# Patient Record
Sex: Female | Born: 1960
Health system: Southern US, Community
[De-identification: ages and names within clinical notes are randomized; demographics above are authoritative.]

## PROBLEM LIST (undated history)

## (undated) DIAGNOSIS — Z860100 Personal history of colon polyps, unspecified: Secondary | ICD-10-CM

## (undated) DIAGNOSIS — I10 Essential (primary) hypertension: Secondary | ICD-10-CM

## (undated) DIAGNOSIS — M1990 Unspecified osteoarthritis, unspecified site: Secondary | ICD-10-CM

## (undated) DIAGNOSIS — J329 Chronic sinusitis, unspecified: Secondary | ICD-10-CM

## (undated) DIAGNOSIS — Z9889 Other specified postprocedural states: Secondary | ICD-10-CM

## (undated) DIAGNOSIS — K219 Gastro-esophageal reflux disease without esophagitis: Secondary | ICD-10-CM

## (undated) DIAGNOSIS — K36 Other appendicitis: Secondary | ICD-10-CM

## (undated) DIAGNOSIS — R112 Nausea with vomiting, unspecified: Secondary | ICD-10-CM

## (undated) DIAGNOSIS — R079 Chest pain, unspecified: Secondary | ICD-10-CM

## (undated) DIAGNOSIS — T464X5A Adverse effect of angiotensin-converting-enzyme inhibitors, initial encounter: Secondary | ICD-10-CM

## (undated) DIAGNOSIS — R609 Edema, unspecified: Secondary | ICD-10-CM

## (undated) DIAGNOSIS — T4145XA Adverse effect of unspecified anesthetic, initial encounter: Secondary | ICD-10-CM

## (undated) DIAGNOSIS — E559 Vitamin D deficiency, unspecified: Secondary | ICD-10-CM

## (undated) DIAGNOSIS — I878 Other specified disorders of veins: Secondary | ICD-10-CM

## (undated) DIAGNOSIS — E739 Lactose intolerance, unspecified: Secondary | ICD-10-CM

## (undated) DIAGNOSIS — M549 Dorsalgia, unspecified: Secondary | ICD-10-CM

## (undated) DIAGNOSIS — T783XXA Angioneurotic edema, initial encounter: Secondary | ICD-10-CM

## (undated) DIAGNOSIS — J189 Pneumonia, unspecified organism: Secondary | ICD-10-CM

## (undated) DIAGNOSIS — M2550 Pain in unspecified joint: Secondary | ICD-10-CM

## (undated) DIAGNOSIS — K5900 Constipation, unspecified: Secondary | ICD-10-CM

## (undated) DIAGNOSIS — R0602 Shortness of breath: Secondary | ICD-10-CM

## (undated) DIAGNOSIS — E785 Hyperlipidemia, unspecified: Secondary | ICD-10-CM

## (undated) DIAGNOSIS — R7303 Prediabetes: Secondary | ICD-10-CM

## (undated) DIAGNOSIS — T8859XA Other complications of anesthesia, initial encounter: Secondary | ICD-10-CM

## (undated) HISTORY — PX: APPENDECTOMY: SHX54

## (undated) HISTORY — DX: Gastro-esophageal reflux disease without esophagitis: K21.9

## (undated) HISTORY — DX: Angioneurotic edema, initial encounter: T78.3XXA

## (undated) HISTORY — DX: Chest pain, unspecified: R07.9

## (undated) HISTORY — DX: Prediabetes: R73.03

## (undated) HISTORY — DX: Morbid (severe) obesity due to excess calories: E66.01

## (undated) HISTORY — DX: Chronic sinusitis, unspecified: J32.9

## (undated) HISTORY — DX: Shortness of breath: R06.02

## (undated) HISTORY — DX: Adverse effect of angiotensin-converting-enzyme inhibitors, initial encounter: T46.4X5A

## (undated) HISTORY — PX: ABDOMINAL HYSTERECTOMY: SHX81

## (undated) HISTORY — DX: Constipation, unspecified: K59.00

## (undated) HISTORY — DX: Dorsalgia, unspecified: M54.9

## (undated) HISTORY — DX: Edema, unspecified: R60.9

## (undated) HISTORY — DX: Pain in unspecified joint: M25.50

## (undated) HISTORY — DX: Hyperlipidemia, unspecified: E78.5

## (undated) HISTORY — DX: Essential (primary) hypertension: I10

## (undated) HISTORY — DX: Lactose intolerance, unspecified: E73.9

## (undated) HISTORY — DX: Vitamin D deficiency, unspecified: E55.9

## (undated) HISTORY — PX: KNEE SURGERY: SHX244

## (undated) HISTORY — DX: Other appendicitis: K36

## (undated) HISTORY — DX: Pneumonia, unspecified organism: J18.9

---

## 1998-05-23 ENCOUNTER — Encounter: Admission: RE | Admit: 1998-05-23 | Discharge: 1998-06-28 | Payer: Self-pay | Admitting: Family Medicine

## 1998-09-11 ENCOUNTER — Other Ambulatory Visit: Admission: RE | Admit: 1998-09-11 | Discharge: 1998-09-11 | Payer: Self-pay | Admitting: Gynecology

## 1998-09-24 ENCOUNTER — Other Ambulatory Visit: Admission: RE | Admit: 1998-09-24 | Discharge: 1998-09-24 | Payer: Self-pay | Admitting: Gynecology

## 1999-10-23 ENCOUNTER — Encounter: Admission: RE | Admit: 1999-10-23 | Discharge: 2000-01-21 | Payer: Self-pay | Admitting: Family Medicine

## 2000-10-23 ENCOUNTER — Encounter: Payer: Self-pay | Admitting: Internal Medicine

## 2000-10-23 ENCOUNTER — Encounter: Admission: RE | Admit: 2000-10-23 | Discharge: 2000-10-23 | Payer: Self-pay | Admitting: Internal Medicine

## 2000-10-28 ENCOUNTER — Encounter: Admission: RE | Admit: 2000-10-28 | Discharge: 2000-10-28 | Payer: Self-pay | Admitting: Internal Medicine

## 2000-10-28 ENCOUNTER — Encounter: Payer: Self-pay | Admitting: Internal Medicine

## 2001-04-09 ENCOUNTER — Encounter: Admission: RE | Admit: 2001-04-09 | Discharge: 2001-04-09 | Payer: Self-pay | Admitting: Internal Medicine

## 2001-04-09 ENCOUNTER — Encounter: Payer: Self-pay | Admitting: Internal Medicine

## 2002-06-23 ENCOUNTER — Encounter: Payer: Self-pay | Admitting: Internal Medicine

## 2002-06-23 ENCOUNTER — Encounter: Admission: RE | Admit: 2002-06-23 | Discharge: 2002-06-23 | Payer: Self-pay | Admitting: Internal Medicine

## 2004-01-17 ENCOUNTER — Encounter: Admission: RE | Admit: 2004-01-17 | Discharge: 2004-01-17 | Payer: Self-pay | Admitting: Internal Medicine

## 2005-06-19 ENCOUNTER — Encounter: Admission: RE | Admit: 2005-06-19 | Discharge: 2005-06-19 | Payer: Self-pay | Admitting: Internal Medicine

## 2005-10-08 ENCOUNTER — Encounter: Admission: RE | Admit: 2005-10-08 | Discharge: 2005-10-08 | Payer: Self-pay | Admitting: Internal Medicine

## 2005-12-23 ENCOUNTER — Ambulatory Visit: Payer: Self-pay | Admitting: Family Medicine

## 2006-01-15 ENCOUNTER — Encounter: Admission: RE | Admit: 2006-01-15 | Discharge: 2006-04-15 | Payer: Self-pay | Admitting: Family Medicine

## 2006-01-20 ENCOUNTER — Ambulatory Visit: Payer: Self-pay | Admitting: Family Medicine

## 2006-01-23 ENCOUNTER — Ambulatory Visit: Payer: Self-pay | Admitting: Family Medicine

## 2006-02-26 ENCOUNTER — Ambulatory Visit: Payer: Self-pay | Admitting: Family Medicine

## 2006-03-16 ENCOUNTER — Ambulatory Visit: Payer: Self-pay | Admitting: Family Medicine

## 2006-03-16 LAB — CONVERTED CEMR LAB
ALT: 20 units/L (ref 0–40)
Amylase: 58 units/L (ref 27–131)
Bilirubin, Direct: 0.1 mg/dL (ref 0.0–0.3)
Hemoglobin: 13.6 g/dL (ref 12.0–15.0)
Lipase: 24 units/L (ref 11.0–59.0)
MCHC: 33 g/dL (ref 30.0–36.0)
MCV: 88.6 fL (ref 78.0–100.0)
RBC: 4.65 M/uL (ref 3.87–5.11)
RDW: 13.4 % (ref 11.5–14.6)
Total Protein: 7.1 g/dL (ref 6.0–8.3)

## 2006-03-20 ENCOUNTER — Ambulatory Visit: Payer: Self-pay | Admitting: Family Medicine

## 2006-03-25 ENCOUNTER — Ambulatory Visit: Payer: Self-pay | Admitting: Family Medicine

## 2006-03-30 ENCOUNTER — Ambulatory Visit: Payer: Self-pay | Admitting: Internal Medicine

## 2006-04-14 ENCOUNTER — Ambulatory Visit: Payer: Self-pay | Admitting: Internal Medicine

## 2006-04-14 HISTORY — PX: UPPER GASTROINTESTINAL ENDOSCOPY: SHX188

## 2006-04-17 ENCOUNTER — Ambulatory Visit (HOSPITAL_COMMUNITY): Admission: RE | Admit: 2006-04-17 | Discharge: 2006-04-17 | Payer: Self-pay | Admitting: Internal Medicine

## 2006-05-07 ENCOUNTER — Ambulatory Visit: Payer: Self-pay | Admitting: Internal Medicine

## 2006-05-11 ENCOUNTER — Ambulatory Visit: Payer: Self-pay | Admitting: Family Medicine

## 2006-05-15 ENCOUNTER — Ambulatory Visit: Payer: Self-pay | Admitting: Family Medicine

## 2006-05-16 ENCOUNTER — Encounter (INDEPENDENT_AMBULATORY_CARE_PROVIDER_SITE_OTHER): Payer: Self-pay | Admitting: Family Medicine

## 2006-05-20 ENCOUNTER — Ambulatory Visit: Payer: Self-pay | Admitting: Family Medicine

## 2006-05-26 DIAGNOSIS — I10 Essential (primary) hypertension: Secondary | ICD-10-CM | POA: Insufficient documentation

## 2006-05-26 DIAGNOSIS — K219 Gastro-esophageal reflux disease without esophagitis: Secondary | ICD-10-CM | POA: Insufficient documentation

## 2006-08-12 ENCOUNTER — Ambulatory Visit: Payer: Self-pay | Admitting: Family Medicine

## 2006-08-19 ENCOUNTER — Ambulatory Visit: Payer: Self-pay | Admitting: Family Medicine

## 2006-08-19 LAB — CONVERTED CEMR LAB
CO2: 29 meq/L (ref 19–32)
Creatinine, Ser: 0.8 mg/dL (ref 0.4–1.2)
GFR calc Af Amer: 100 mL/min
Potassium: 3.9 meq/L (ref 3.5–5.1)
Sodium: 141 meq/L (ref 135–145)

## 2006-08-31 ENCOUNTER — Ambulatory Visit: Payer: Self-pay | Admitting: Family Medicine

## 2006-08-31 LAB — CONVERTED CEMR LAB: Glucose, Fasting: 126 mg/dL — ABNORMAL HIGH (ref 70–99)

## 2006-09-04 ENCOUNTER — Ambulatory Visit: Payer: Self-pay | Admitting: Family Medicine

## 2006-09-09 ENCOUNTER — Ambulatory Visit: Payer: Self-pay | Admitting: Family Medicine

## 2006-09-09 LAB — CONVERTED CEMR LAB
Cholesterol: 206 mg/dL (ref 0–200)
Creatinine,U: 256.4 mg/dL
Direct LDL: 137.5 mg/dL
HDL: 54.8 mg/dL (ref >39.0)
Total CHOL/HDL Ratio: 3.8
VLDL: 9 mg/dL (ref 0–40)

## 2006-09-25 ENCOUNTER — Encounter (INDEPENDENT_AMBULATORY_CARE_PROVIDER_SITE_OTHER): Payer: Self-pay | Admitting: Family Medicine

## 2006-10-07 ENCOUNTER — Telehealth (INDEPENDENT_AMBULATORY_CARE_PROVIDER_SITE_OTHER): Payer: Self-pay | Admitting: Family Medicine

## 2006-10-08 ENCOUNTER — Encounter (INDEPENDENT_AMBULATORY_CARE_PROVIDER_SITE_OTHER): Payer: Self-pay | Admitting: Family Medicine

## 2006-10-15 ENCOUNTER — Emergency Department (HOSPITAL_COMMUNITY): Admission: EM | Admit: 2006-10-15 | Discharge: 2006-10-15 | Payer: Self-pay | Admitting: Emergency Medicine

## 2006-11-09 ENCOUNTER — Ambulatory Visit: Payer: Self-pay | Admitting: Family Medicine

## 2006-11-09 ENCOUNTER — Telehealth (INDEPENDENT_AMBULATORY_CARE_PROVIDER_SITE_OTHER): Payer: Self-pay | Admitting: Family Medicine

## 2006-11-09 DIAGNOSIS — E785 Hyperlipidemia, unspecified: Secondary | ICD-10-CM | POA: Insufficient documentation

## 2006-11-09 DIAGNOSIS — E1151 Type 2 diabetes mellitus with diabetic peripheral angiopathy without gangrene: Secondary | ICD-10-CM | POA: Insufficient documentation

## 2006-12-16 ENCOUNTER — Ambulatory Visit: Payer: Self-pay | Admitting: Family Medicine

## 2006-12-16 DIAGNOSIS — R609 Edema, unspecified: Secondary | ICD-10-CM | POA: Insufficient documentation

## 2006-12-18 LAB — CONVERTED CEMR LAB
CO2: 28 meq/L (ref 19–32)
Cholesterol: 200 mg/dL (ref 0–200)
GFR calc Af Amer: 77 mL/min
Glucose, Bld: 110 mg/dL — ABNORMAL HIGH (ref 70–99)
LDL Cholesterol: 133 mg/dL — ABNORMAL HIGH (ref 0–99)
Potassium: 4 meq/L (ref 3.5–5.1)
Sodium: 140 meq/L (ref 135–145)
Total CHOL/HDL Ratio: 3.5
Triglycerides: 44 mg/dL (ref 0–149)

## 2007-02-08 ENCOUNTER — Telehealth (INDEPENDENT_AMBULATORY_CARE_PROVIDER_SITE_OTHER): Payer: Self-pay | Admitting: *Deleted

## 2007-02-10 ENCOUNTER — Ambulatory Visit: Payer: Self-pay | Admitting: Family Medicine

## 2007-02-10 ENCOUNTER — Encounter (INDEPENDENT_AMBULATORY_CARE_PROVIDER_SITE_OTHER): Payer: Self-pay | Admitting: *Deleted

## 2007-02-10 ENCOUNTER — Telehealth (INDEPENDENT_AMBULATORY_CARE_PROVIDER_SITE_OTHER): Payer: Self-pay | Admitting: *Deleted

## 2007-02-12 ENCOUNTER — Telehealth (INDEPENDENT_AMBULATORY_CARE_PROVIDER_SITE_OTHER): Payer: Self-pay | Admitting: *Deleted

## 2007-02-12 ENCOUNTER — Encounter (INDEPENDENT_AMBULATORY_CARE_PROVIDER_SITE_OTHER): Payer: Self-pay | Admitting: *Deleted

## 2007-02-19 ENCOUNTER — Telehealth (INDEPENDENT_AMBULATORY_CARE_PROVIDER_SITE_OTHER): Payer: Self-pay | Admitting: *Deleted

## 2007-02-19 ENCOUNTER — Telehealth (INDEPENDENT_AMBULATORY_CARE_PROVIDER_SITE_OTHER): Payer: Self-pay | Admitting: Family Medicine

## 2007-02-24 ENCOUNTER — Ambulatory Visit: Payer: Self-pay | Admitting: Family Medicine

## 2007-02-25 ENCOUNTER — Telehealth (INDEPENDENT_AMBULATORY_CARE_PROVIDER_SITE_OTHER): Payer: Self-pay | Admitting: *Deleted

## 2007-02-25 LAB — CONVERTED CEMR LAB
ALT: 25 units/L (ref 0–35)
LDL Cholesterol: 81 mg/dL (ref 0–99)
Total CHOL/HDL Ratio: 2.6
Triglycerides: 32 mg/dL (ref 0–149)
VLDL: 6 mg/dL (ref 0–40)

## 2007-03-02 ENCOUNTER — Telehealth (INDEPENDENT_AMBULATORY_CARE_PROVIDER_SITE_OTHER): Payer: Self-pay | Admitting: *Deleted

## 2007-03-10 ENCOUNTER — Encounter (INDEPENDENT_AMBULATORY_CARE_PROVIDER_SITE_OTHER): Payer: Self-pay | Admitting: Family Medicine

## 2007-03-16 ENCOUNTER — Encounter (INDEPENDENT_AMBULATORY_CARE_PROVIDER_SITE_OTHER): Payer: Self-pay | Admitting: Family Medicine

## 2007-03-19 ENCOUNTER — Ambulatory Visit: Payer: Self-pay | Admitting: Family Medicine

## 2007-03-25 ENCOUNTER — Telehealth (INDEPENDENT_AMBULATORY_CARE_PROVIDER_SITE_OTHER): Payer: Self-pay | Admitting: *Deleted

## 2007-03-30 ENCOUNTER — Telehealth (INDEPENDENT_AMBULATORY_CARE_PROVIDER_SITE_OTHER): Payer: Self-pay | Admitting: *Deleted

## 2007-04-02 ENCOUNTER — Encounter (INDEPENDENT_AMBULATORY_CARE_PROVIDER_SITE_OTHER): Payer: Self-pay | Admitting: *Deleted

## 2007-04-05 ENCOUNTER — Telehealth (INDEPENDENT_AMBULATORY_CARE_PROVIDER_SITE_OTHER): Payer: Self-pay | Admitting: *Deleted

## 2007-04-07 ENCOUNTER — Telehealth (INDEPENDENT_AMBULATORY_CARE_PROVIDER_SITE_OTHER): Payer: Self-pay | Admitting: *Deleted

## 2007-04-20 ENCOUNTER — Encounter (INDEPENDENT_AMBULATORY_CARE_PROVIDER_SITE_OTHER): Payer: Self-pay | Admitting: Family Medicine

## 2007-04-23 ENCOUNTER — Telehealth (INDEPENDENT_AMBULATORY_CARE_PROVIDER_SITE_OTHER): Payer: Self-pay | Admitting: *Deleted

## 2007-04-26 ENCOUNTER — Telehealth (INDEPENDENT_AMBULATORY_CARE_PROVIDER_SITE_OTHER): Payer: Self-pay | Admitting: *Deleted

## 2007-05-19 ENCOUNTER — Telehealth (INDEPENDENT_AMBULATORY_CARE_PROVIDER_SITE_OTHER): Payer: Self-pay | Admitting: *Deleted

## 2007-05-19 ENCOUNTER — Ambulatory Visit: Payer: Self-pay | Admitting: Family Medicine

## 2007-05-19 ENCOUNTER — Encounter: Admission: RE | Admit: 2007-05-19 | Discharge: 2007-05-19 | Payer: Self-pay | Admitting: Family Medicine

## 2007-05-19 LAB — CONVERTED CEMR LAB
Basophils Relative: 0.5 % (ref 0.0–1.0)
HDL: 46.1 mg/dL (ref >39.0)
Hemoglobin: 12.2 g/dL (ref 12.0–15.0)
LDL Cholesterol: 90 mg/dL (ref 0–99)
Monocytes Absolute: 0.4 10*3/uL (ref 0.2–0.7)
Monocytes Relative: 9.6 % (ref 3.0–11.0)
RDW: 13.3 % (ref 11.5–14.6)
TSH: 1.65 microintl units/mL (ref 0.35–5.50)
VLDL: 9 mg/dL (ref 0–40)
WBC: 4.3 10*3/uL — ABNORMAL LOW (ref 4.5–10.5)

## 2007-06-08 ENCOUNTER — Encounter: Admission: RE | Admit: 2007-06-08 | Discharge: 2007-06-08 | Payer: Self-pay | Admitting: Family Medicine

## 2007-06-08 ENCOUNTER — Other Ambulatory Visit: Admission: RE | Admit: 2007-06-08 | Discharge: 2007-06-08 | Payer: Self-pay | Admitting: Gynecology

## 2007-07-20 ENCOUNTER — Ambulatory Visit: Payer: Self-pay | Admitting: Family Medicine

## 2007-07-20 LAB — CONVERTED CEMR LAB
BUN: 13 mg/dL (ref 6–23)
Chloride: 104 meq/L (ref 96–112)
Creatinine, Ser: 1 mg/dL (ref 0.4–1.2)
Hgb A1c MFr Bld: 7 % — ABNORMAL HIGH (ref 4.6–6.0)
Sodium: 138 meq/L (ref 135–145)

## 2007-07-23 ENCOUNTER — Telehealth (INDEPENDENT_AMBULATORY_CARE_PROVIDER_SITE_OTHER): Payer: Self-pay | Admitting: *Deleted

## 2007-08-20 ENCOUNTER — Ambulatory Visit: Payer: Self-pay | Admitting: Family Medicine

## 2007-08-30 ENCOUNTER — Encounter (INDEPENDENT_AMBULATORY_CARE_PROVIDER_SITE_OTHER): Payer: Self-pay | Admitting: Family Medicine

## 2007-09-07 ENCOUNTER — Encounter: Payer: Self-pay | Admitting: Family Medicine

## 2007-09-20 ENCOUNTER — Telehealth (INDEPENDENT_AMBULATORY_CARE_PROVIDER_SITE_OTHER): Payer: Self-pay | Admitting: *Deleted

## 2007-11-09 ENCOUNTER — Ambulatory Visit: Payer: Self-pay | Admitting: Family Medicine

## 2007-11-11 ENCOUNTER — Encounter: Payer: Self-pay | Admitting: Family Medicine

## 2007-11-16 ENCOUNTER — Telehealth (INDEPENDENT_AMBULATORY_CARE_PROVIDER_SITE_OTHER): Payer: Self-pay | Admitting: *Deleted

## 2007-11-16 ENCOUNTER — Ambulatory Visit: Payer: Self-pay | Admitting: Family Medicine

## 2007-11-17 ENCOUNTER — Telehealth (INDEPENDENT_AMBULATORY_CARE_PROVIDER_SITE_OTHER): Payer: Self-pay | Admitting: *Deleted

## 2007-11-20 LAB — CONVERTED CEMR LAB
AST: 20 units/L (ref 0–37)
Alkaline Phosphatase: 79 units/L (ref 39–117)
Basophils Absolute: 0 10*3/uL (ref 0.0–0.1)
Chloride: 103 meq/L (ref 96–112)
Cholesterol: 195 mg/dL (ref 0–200)
Creatinine,U: 171.6 mg/dL
Eosinophils Absolute: 0.1 10*3/uL (ref 0.0–0.7)
GFR calc non Af Amer: 72 mL/min
HDL: 60 mg/dL (ref >39.0)
Hgb A1c MFr Bld: 7.1 % — ABNORMAL HIGH (ref 4.6–6.0)
MCHC: 33.4 g/dL (ref 30.0–36.0)
MCV: 89.3 fL (ref 78.0–100.0)
Microalb Creat Ratio: 2.3 mg/g (ref 0.0–30.0)
Neutrophils Relative %: 53.1 % (ref 43.0–77.0)
Platelets: 272 10*3/uL (ref 150–400)
Potassium: 3.4 meq/L — ABNORMAL LOW (ref 3.5–5.1)
RDW: 13.1 % (ref 11.5–14.6)
Total Bilirubin: 0.7 mg/dL (ref 0.3–1.2)
Uric Acid, Serum: 6.3 mg/dL (ref 2.4–7.0)
VLDL: 9 mg/dL (ref 0–40)

## 2007-11-22 ENCOUNTER — Encounter (INDEPENDENT_AMBULATORY_CARE_PROVIDER_SITE_OTHER): Payer: Self-pay | Admitting: *Deleted

## 2007-11-23 ENCOUNTER — Ambulatory Visit: Payer: Self-pay | Admitting: Family Medicine

## 2007-11-26 ENCOUNTER — Ambulatory Visit: Payer: Self-pay | Admitting: Family Medicine

## 2007-12-07 ENCOUNTER — Ambulatory Visit: Payer: Self-pay | Admitting: Family Medicine

## 2007-12-14 ENCOUNTER — Ambulatory Visit: Payer: Self-pay | Admitting: Family Medicine

## 2007-12-21 ENCOUNTER — Ambulatory Visit: Payer: Self-pay | Admitting: Family Medicine

## 2007-12-24 ENCOUNTER — Ambulatory Visit: Payer: Self-pay | Admitting: Family Medicine

## 2007-12-25 ENCOUNTER — Encounter: Payer: Self-pay | Admitting: Family Medicine

## 2007-12-27 ENCOUNTER — Encounter (INDEPENDENT_AMBULATORY_CARE_PROVIDER_SITE_OTHER): Payer: Self-pay | Admitting: *Deleted

## 2007-12-27 ENCOUNTER — Ambulatory Visit: Payer: Self-pay | Admitting: Family Medicine

## 2007-12-27 LAB — CONVERTED CEMR LAB
Glucose, Bld: 118 mg/dL
Hemoglobin: 14.3 g/dL

## 2008-01-13 ENCOUNTER — Encounter (INDEPENDENT_AMBULATORY_CARE_PROVIDER_SITE_OTHER): Payer: Self-pay | Admitting: *Deleted

## 2008-01-13 ENCOUNTER — Ambulatory Visit: Payer: Self-pay | Admitting: Family Medicine

## 2008-02-07 ENCOUNTER — Telehealth (INDEPENDENT_AMBULATORY_CARE_PROVIDER_SITE_OTHER): Payer: Self-pay | Admitting: *Deleted

## 2008-02-23 ENCOUNTER — Ambulatory Visit: Payer: Self-pay | Admitting: Family Medicine

## 2008-02-29 LAB — CONVERTED CEMR LAB
ALT: 23 units/L (ref 0–35)
AST: 18 units/L (ref 0–37)
Albumin: 3.5 g/dL (ref 3.5–5.2)
Alkaline Phosphatase: 71 units/L (ref 39–117)
BUN: 13 mg/dL (ref 6–23)
Bilirubin, Direct: 0.1 mg/dL (ref 0.0–0.3)
CO2: 29 meq/L (ref 19–32)
Calcium: 8.5 mg/dL (ref 8.4–10.5)
Chloride: 105 meq/L (ref 96–112)
Cholesterol: 153 mg/dL (ref 0–200)
Creatinine, Ser: 0.9 mg/dL (ref 0.4–1.2)
GFR calc Af Amer: 86 mL/min
GFR calc non Af Amer: 71 mL/min
Glucose, Bld: 83 mg/dL (ref 70–99)
HDL: 53.3 mg/dL (ref >39.0)
Hgb A1c MFr Bld: 6.3 % — ABNORMAL HIGH (ref 4.6–6.0)
LDL Cholesterol: 88 mg/dL (ref 0–99)
Potassium: 3.7 meq/L (ref 3.5–5.1)
Sodium: 140 meq/L (ref 135–145)
Total Bilirubin: 0.6 mg/dL (ref 0.3–1.2)
Total CHOL/HDL Ratio: 2.9
Total Protein: 6.7 g/dL (ref 6.0–8.3)
Triglycerides: 59 mg/dL (ref 0–149)
VLDL: 12 mg/dL (ref 0–40)

## 2008-03-01 ENCOUNTER — Encounter (INDEPENDENT_AMBULATORY_CARE_PROVIDER_SITE_OTHER): Payer: Self-pay | Admitting: *Deleted

## 2008-03-03 ENCOUNTER — Ambulatory Visit: Payer: Self-pay | Admitting: Family Medicine

## 2008-03-15 ENCOUNTER — Telehealth (INDEPENDENT_AMBULATORY_CARE_PROVIDER_SITE_OTHER): Payer: Self-pay | Admitting: *Deleted

## 2008-03-17 ENCOUNTER — Ambulatory Visit: Payer: Self-pay | Admitting: Family Medicine

## 2008-03-20 ENCOUNTER — Ambulatory Visit (HOSPITAL_COMMUNITY): Admission: RE | Admit: 2008-03-20 | Discharge: 2008-03-20 | Payer: Self-pay | Admitting: Family Medicine

## 2008-03-20 ENCOUNTER — Telehealth: Payer: Self-pay | Admitting: Family Medicine

## 2008-03-20 ENCOUNTER — Ambulatory Visit: Payer: Self-pay | Admitting: *Deleted

## 2008-03-20 ENCOUNTER — Encounter: Payer: Self-pay | Admitting: Family Medicine

## 2008-03-20 ENCOUNTER — Telehealth (INDEPENDENT_AMBULATORY_CARE_PROVIDER_SITE_OTHER): Payer: Self-pay | Admitting: *Deleted

## 2008-03-22 ENCOUNTER — Encounter: Payer: Self-pay | Admitting: Family Medicine

## 2008-03-22 ENCOUNTER — Telehealth (INDEPENDENT_AMBULATORY_CARE_PROVIDER_SITE_OTHER): Payer: Self-pay | Admitting: *Deleted

## 2008-04-05 ENCOUNTER — Ambulatory Visit: Payer: Self-pay | Admitting: Family Medicine

## 2008-04-05 LAB — CONVERTED CEMR LAB
Nitrite: NEGATIVE
Specific Gravity, Urine: 1.02
WBC Urine, dipstick: NEGATIVE

## 2008-04-11 ENCOUNTER — Ambulatory Visit (HOSPITAL_COMMUNITY): Admission: RE | Admit: 2008-04-11 | Discharge: 2008-04-11 | Payer: Self-pay | Admitting: Family Medicine

## 2008-04-13 ENCOUNTER — Telehealth (INDEPENDENT_AMBULATORY_CARE_PROVIDER_SITE_OTHER): Payer: Self-pay | Admitting: *Deleted

## 2008-04-23 LAB — CONVERTED CEMR LAB
ALT: 24 units/L (ref 0–35)
Basophils Absolute: 0 10*3/uL (ref 0.0–0.1)
Bilirubin, Direct: 0.1 mg/dL (ref 0.0–0.3)
CO2: 29 meq/L (ref 19–32)
Calcium: 8.4 mg/dL (ref 8.4–10.5)
Chloride: 108 meq/L (ref 96–112)
Lipase: 21 units/L (ref 11.0–59.0)
Lymphocytes Relative: 45.6 % (ref 12.0–46.0)
MCHC: 34.2 g/dL (ref 30.0–36.0)
Neutro Abs: 1.4 10*3/uL (ref 1.4–7.7)
Neutrophils Relative %: 43.9 % (ref 43.0–77.0)
Platelets: 229 10*3/uL (ref 150–400)
RDW: 13.2 % (ref 11.5–14.6)
Sodium: 141 meq/L (ref 135–145)
Total Bilirubin: 0.6 mg/dL (ref 0.3–1.2)

## 2008-04-24 ENCOUNTER — Encounter (INDEPENDENT_AMBULATORY_CARE_PROVIDER_SITE_OTHER): Payer: Self-pay | Admitting: *Deleted

## 2008-06-29 ENCOUNTER — Ambulatory Visit: Payer: Self-pay | Admitting: Family Medicine

## 2008-06-29 ENCOUNTER — Telehealth (INDEPENDENT_AMBULATORY_CARE_PROVIDER_SITE_OTHER): Payer: Self-pay | Admitting: *Deleted

## 2008-07-05 ENCOUNTER — Encounter: Payer: Self-pay | Admitting: Family Medicine

## 2008-08-21 ENCOUNTER — Ambulatory Visit: Payer: Self-pay | Admitting: Family Medicine

## 2008-08-21 DIAGNOSIS — R55 Syncope and collapse: Secondary | ICD-10-CM | POA: Insufficient documentation

## 2008-08-21 LAB — CONVERTED CEMR LAB: Glucose, Bld: 107 mg/dL

## 2008-08-22 ENCOUNTER — Encounter: Payer: Self-pay | Admitting: Family Medicine

## 2008-08-22 LAB — CONVERTED CEMR LAB
ALT: 24 units/L (ref 0–35)
Alkaline Phosphatase: 70 units/L (ref 39–117)
Basophils Relative: 0.6 % (ref 0.0–3.0)
Bilirubin, Direct: 0.1 mg/dL (ref 0.0–0.3)
Calcium: 8.9 mg/dL (ref 8.4–10.5)
Chloride: 105 meq/L (ref 96–112)
Creatinine, Ser: 1 mg/dL (ref 0.4–1.2)
Eosinophils Relative: 0.9 % (ref 0.0–5.0)
Folate: 6.6 ng/mL
Lymphocytes Relative: 27.5 % (ref 12.0–46.0)
Monocytes Relative: 1.7 % — ABNORMAL LOW (ref 3.0–12.0)
Neutrophils Relative %: 69.3 % (ref 43.0–77.0)
RBC: 4.91 M/uL (ref 3.87–5.11)
Total Protein: 7.1 g/dL (ref 6.0–8.3)
Vitamin B-12: 259 pg/mL (ref 211–911)
WBC: 5.1 10*3/uL (ref 4.5–10.5)

## 2008-08-23 ENCOUNTER — Telehealth (INDEPENDENT_AMBULATORY_CARE_PROVIDER_SITE_OTHER): Payer: Self-pay | Admitting: *Deleted

## 2008-08-29 ENCOUNTER — Ambulatory Visit: Payer: Self-pay | Admitting: Family Medicine

## 2008-08-29 LAB — CONVERTED CEMR LAB
LDL Cholesterol: 124 mg/dL — ABNORMAL HIGH (ref 0–99)
VLDL: 8.4 mg/dL (ref 0.0–40.0)

## 2008-08-30 ENCOUNTER — Encounter (INDEPENDENT_AMBULATORY_CARE_PROVIDER_SITE_OTHER): Payer: Self-pay | Admitting: *Deleted

## 2008-09-01 ENCOUNTER — Ambulatory Visit: Payer: Self-pay

## 2008-09-06 ENCOUNTER — Encounter: Payer: Self-pay | Admitting: Family Medicine

## 2008-09-07 ENCOUNTER — Telehealth (INDEPENDENT_AMBULATORY_CARE_PROVIDER_SITE_OTHER): Payer: Self-pay | Admitting: *Deleted

## 2008-09-29 ENCOUNTER — Ambulatory Visit: Payer: Self-pay | Admitting: Family Medicine

## 2008-09-29 DIAGNOSIS — E538 Deficiency of other specified B group vitamins: Secondary | ICD-10-CM | POA: Insufficient documentation

## 2008-10-26 ENCOUNTER — Ambulatory Visit: Payer: Self-pay | Admitting: Family Medicine

## 2008-11-02 ENCOUNTER — Telehealth (INDEPENDENT_AMBULATORY_CARE_PROVIDER_SITE_OTHER): Payer: Self-pay | Admitting: *Deleted

## 2008-12-06 ENCOUNTER — Telehealth (INDEPENDENT_AMBULATORY_CARE_PROVIDER_SITE_OTHER): Payer: Self-pay | Admitting: *Deleted

## 2008-12-27 ENCOUNTER — Ambulatory Visit: Payer: Self-pay | Admitting: Family Medicine

## 2008-12-27 DIAGNOSIS — IMO0002 Reserved for concepts with insufficient information to code with codable children: Secondary | ICD-10-CM | POA: Insufficient documentation

## 2009-01-01 ENCOUNTER — Ambulatory Visit (HOSPITAL_BASED_OUTPATIENT_CLINIC_OR_DEPARTMENT_OTHER): Admission: RE | Admit: 2009-01-01 | Discharge: 2009-01-01 | Payer: Self-pay | Admitting: Family Medicine

## 2009-01-01 ENCOUNTER — Ambulatory Visit: Payer: Self-pay | Admitting: Diagnostic Radiology

## 2009-01-01 ENCOUNTER — Ambulatory Visit: Payer: Self-pay | Admitting: Family Medicine

## 2009-01-02 ENCOUNTER — Telehealth: Payer: Self-pay | Admitting: Family Medicine

## 2009-01-02 ENCOUNTER — Encounter: Payer: Self-pay | Admitting: Family Medicine

## 2009-01-09 ENCOUNTER — Encounter (INDEPENDENT_AMBULATORY_CARE_PROVIDER_SITE_OTHER): Payer: Self-pay | Admitting: *Deleted

## 2009-01-09 ENCOUNTER — Telehealth (INDEPENDENT_AMBULATORY_CARE_PROVIDER_SITE_OTHER): Payer: Self-pay | Admitting: *Deleted

## 2009-01-09 LAB — CONVERTED CEMR LAB
ALT: 24 units/L (ref 0–35)
AST: 23 units/L (ref 0–37)
BUN: 12 mg/dL (ref 6–23)
Bilirubin, Direct: 0 mg/dL (ref 0.0–0.3)
Calcium: 8.5 mg/dL (ref 8.4–10.5)
Cholesterol: 140 mg/dL (ref 0–200)
GFR calc non Af Amer: 85.98 mL/min (ref >60)
HDL: 54.4 mg/dL (ref >39.00)
Potassium: 4 meq/L (ref 3.5–5.1)
Sodium: 139 meq/L (ref 135–145)
Total Protein: 6.7 g/dL (ref 6.0–8.3)
VLDL: 9.4 mg/dL (ref 0.0–40.0)

## 2009-01-11 ENCOUNTER — Telehealth (INDEPENDENT_AMBULATORY_CARE_PROVIDER_SITE_OTHER): Payer: Self-pay | Admitting: *Deleted

## 2009-01-24 ENCOUNTER — Encounter: Admission: RE | Admit: 2009-01-24 | Discharge: 2009-04-23 | Payer: Self-pay | Admitting: Family Medicine

## 2009-02-07 ENCOUNTER — Ambulatory Visit: Payer: Self-pay | Admitting: Family Medicine

## 2009-02-07 LAB — CONVERTED CEMR LAB
Glucose, Urine, Semiquant: NEGATIVE
Nitrite: NEGATIVE
Protein, U semiquant: NEGATIVE
WBC Urine, dipstick: NEGATIVE
pH: 7.5

## 2009-02-13 ENCOUNTER — Telehealth: Payer: Self-pay | Admitting: Family Medicine

## 2009-02-21 ENCOUNTER — Encounter: Payer: Self-pay | Admitting: Family Medicine

## 2009-02-28 ENCOUNTER — Encounter: Payer: Self-pay | Admitting: Family Medicine

## 2009-03-01 ENCOUNTER — Ambulatory Visit: Payer: Self-pay | Admitting: Family Medicine

## 2009-03-13 ENCOUNTER — Telehealth (INDEPENDENT_AMBULATORY_CARE_PROVIDER_SITE_OTHER): Payer: Self-pay | Admitting: *Deleted

## 2009-03-13 ENCOUNTER — Ambulatory Visit: Payer: Self-pay | Admitting: Family Medicine

## 2009-03-15 ENCOUNTER — Encounter: Payer: Self-pay | Admitting: Family Medicine

## 2009-03-15 ENCOUNTER — Telehealth: Payer: Self-pay | Admitting: Family Medicine

## 2009-05-14 ENCOUNTER — Telehealth (INDEPENDENT_AMBULATORY_CARE_PROVIDER_SITE_OTHER): Payer: Self-pay | Admitting: *Deleted

## 2009-05-26 ENCOUNTER — Ambulatory Visit: Payer: Self-pay | Admitting: Family Medicine

## 2009-07-17 ENCOUNTER — Ambulatory Visit: Payer: Self-pay | Admitting: Family Medicine

## 2009-07-30 ENCOUNTER — Telehealth (INDEPENDENT_AMBULATORY_CARE_PROVIDER_SITE_OTHER): Payer: Self-pay | Admitting: *Deleted

## 2009-07-31 ENCOUNTER — Ambulatory Visit: Payer: Self-pay | Admitting: Family Medicine

## 2009-08-28 ENCOUNTER — Telehealth (INDEPENDENT_AMBULATORY_CARE_PROVIDER_SITE_OTHER): Payer: Self-pay | Admitting: *Deleted

## 2009-10-04 ENCOUNTER — Telehealth: Payer: Self-pay | Admitting: Family Medicine

## 2009-10-08 ENCOUNTER — Ambulatory Visit: Payer: Self-pay | Admitting: Family Medicine

## 2009-10-12 ENCOUNTER — Telehealth: Payer: Self-pay | Admitting: Family Medicine

## 2009-10-17 ENCOUNTER — Ambulatory Visit: Payer: Self-pay | Admitting: Family Medicine

## 2009-10-17 DIAGNOSIS — K3189 Other diseases of stomach and duodenum: Secondary | ICD-10-CM | POA: Insufficient documentation

## 2009-10-17 DIAGNOSIS — R1013 Epigastric pain: Secondary | ICD-10-CM

## 2009-10-19 ENCOUNTER — Telehealth (INDEPENDENT_AMBULATORY_CARE_PROVIDER_SITE_OTHER): Payer: Self-pay | Admitting: *Deleted

## 2009-10-19 LAB — CONVERTED CEMR LAB
AST: 15 units/L (ref 0–37)
BUN: 14 mg/dL (ref 6–23)
Basophils Absolute: 0 10*3/uL (ref 0.0–0.1)
Bilirubin, Direct: 0.1 mg/dL (ref 0.0–0.3)
Calcium: 9.1 mg/dL (ref 8.4–10.5)
Cholesterol: 203 mg/dL — ABNORMAL HIGH (ref 0–200)
Creatinine, Ser: 0.9 mg/dL (ref 0.4–1.2)
Direct LDL: 129.1 mg/dL
Folate: 8.1 ng/mL
GFR calc non Af Amer: 89.11 mL/min (ref >60)
Glucose, Bld: 142 mg/dL — ABNORMAL HIGH (ref 70–99)
HCT: 45.2 % (ref 36.0–46.0)
HDL: 61.4 mg/dL (ref >39.00)
Hgb A1c MFr Bld: 6.6 % — ABNORMAL HIGH (ref 4.6–6.5)
Lymphocytes Relative: 22.8 % (ref 12.0–46.0)
Lymphs Abs: 1.2 10*3/uL (ref 0.7–4.0)
Monocytes Relative: 3.9 % (ref 3.0–12.0)
Neutrophils Relative %: 72.2 % (ref 43.0–77.0)
Platelets: 273 10*3/uL (ref 150.0–400.0)
RDW: 14.1 % (ref 11.5–14.6)
Total Bilirubin: 0.5 mg/dL (ref 0.3–1.2)
Triglycerides: 45 mg/dL (ref 0.0–149.0)
VLDL: 9 mg/dL (ref 0.0–40.0)
Vitamin B-12: 285 pg/mL (ref 211–911)

## 2009-11-15 ENCOUNTER — Ambulatory Visit: Payer: Self-pay | Admitting: Family Medicine

## 2009-12-04 ENCOUNTER — Encounter: Admission: RE | Admit: 2009-12-04 | Discharge: 2009-12-04 | Payer: Self-pay | Admitting: Family Medicine

## 2010-01-04 ENCOUNTER — Ambulatory Visit: Payer: Self-pay | Admitting: Family Medicine

## 2010-01-08 LAB — CONVERTED CEMR LAB
ALT: 20 units/L (ref 0–35)
AST: 19 units/L (ref 0–37)
BUN: 16 mg/dL (ref 6–23)
Bilirubin, Direct: 0.1 mg/dL (ref 0.0–0.3)
CO2: 27 meq/L (ref 19–32)
Chloride: 102 meq/L (ref 96–112)
Creatinine,U: 34.3 mg/dL
LDL Cholesterol: 61 mg/dL (ref 0–99)
Microalb, Ur: 0.1 mg/dL (ref 0.0–1.9)
Potassium: 4.1 meq/L (ref 3.5–5.1)
Total Bilirubin: 0.4 mg/dL (ref 0.3–1.2)
Total CHOL/HDL Ratio: 2

## 2010-01-18 ENCOUNTER — Ambulatory Visit: Payer: Self-pay | Admitting: Family Medicine

## 2010-05-26 ENCOUNTER — Encounter: Payer: Self-pay | Admitting: Family Medicine

## 2010-05-30 ENCOUNTER — Ambulatory Visit
Admission: RE | Admit: 2010-05-30 | Discharge: 2010-05-30 | Payer: Self-pay | Source: Home / Self Care | Attending: Family Medicine | Admitting: Family Medicine

## 2010-06-02 LAB — CONVERTED CEMR LAB
ALT: 18 units/L (ref 0–35)
ALT: 24 units/L (ref 0–35)
AST: 15 units/L (ref 0–37)
AST: 20 units/L (ref 0–37)
BUN: 12 mg/dL (ref 6–23)
Basophils Absolute: 0 10*3/uL (ref 0.0–0.1)
Bilirubin, Direct: 0 mg/dL (ref 0.0–0.3)
Bilirubin, Direct: 0.1 mg/dL (ref 0.0–0.3)
Blood in Urine, dipstick: NEGATIVE
CO2: 27 meq/L (ref 19–32)
Chloride: 107 meq/L (ref 96–112)
Cholesterol: 160 mg/dL (ref 0–200)
Creatinine, Ser: 0.9 mg/dL (ref 0.4–1.2)
Eosinophils Relative: 0.6 % (ref 0.0–5.0)
Folate: 7.8 ng/mL
GFR calc non Af Amer: 85.9 mL/min (ref >60)
Glucose, Bld: 106 mg/dL — ABNORMAL HIGH (ref 70–99)
HCT: 37.3 % (ref 36.0–46.0)
HDL: 50.5 mg/dL (ref >39.00)
Hgb A1c MFr Bld: 6.3 % (ref 4.6–6.5)
Ketones, urine, test strip: NEGATIVE
LDL Cholesterol: 104 mg/dL — ABNORMAL HIGH (ref 0–99)
Lymphs Abs: 1.4 10*3/uL (ref 0.7–4.0)
Monocytes Absolute: 0.1 10*3/uL (ref 0.1–1.0)
Monocytes Relative: 2.7 % — ABNORMAL LOW (ref 3.0–12.0)
Neutrophils Relative %: 64.1 % (ref 43.0–77.0)
Nitrite: NEGATIVE
Platelets: 228 10*3/uL (ref 150.0–400.0)
Potassium: 3.6 meq/L (ref 3.5–5.1)
RDW: 13 % (ref 11.5–14.6)
Specific Gravity, Urine: 1.025
TSH: 0.93 microintl units/mL (ref 0.35–5.50)
Total Bilirubin: 0.5 mg/dL (ref 0.3–1.2)
Total Bilirubin: 0.7 mg/dL (ref 0.3–1.2)
Total Protein: 7 g/dL (ref 6.0–8.3)
Triglycerides: 30 mg/dL (ref 0.0–149.0)
Urobilinogen, UA: 0.2
VLDL: 6 mg/dL (ref 0.0–40.0)
WBC Urine, dipstick: NEGATIVE
WBC: 4.3 10*3/uL — ABNORMAL LOW (ref 4.5–10.5)

## 2010-06-04 NOTE — Assessment & Plan Note (Signed)
Summary: rto 1 month/cbs   Vital Signs:  Patient profile:   50 year old female Height:      66 inches Weight:      340 pounds Temp:     98.2 degrees F oral Pulse rate:   82 / minute BP sitting:   118 / 82  (left arm)  Vitals Entered By: Jeremy Johann CMA (November 15, 2009 12:54 PM) CC: 1 month f/u Comments REVIEWED MED LIST, PATIENT AGREED DOSE AND INSTRUCTION CORRECT    History of Present Illness:  Type 1 diabetes mellitus follow-up      This is a 50 year old woman who presents with Type 2 diabetes mellitus follow-up.  The patient denies polyuria, polydipsia, blurred vision, self managed hypoglycemia, hypoglycemia requiring help, weight loss, weight gain, and numbness of extremities.  The patient denies the following symptoms: neuropathic pain, chest pain, vomiting, orthostatic symptoms, poor wound healing, intermittent claudication, vision loss, and foot ulcer.  Since the last visit the patient reports good dietary compliance, compliance with medications, and monitoring blood glucose.  Since the last visit, the patient reports having had eye care by an ophthalmologist.    Hypertension follow-up      The patient also presents for Hypertension follow-up.  The patient complains of lightheadedness and fatigue.  The patient denies the following associated symptoms: chest pain, chest pressure, exercise intolerance, dyspnea, palpitations, syncope, leg edema, and pedal edema.  Compliance with medications (by patient report) has been near 100%.  The patient reports that dietary compliance has been good.  The patient reports exercising occasionally.  Adjunctive measures currently used by the patient include salt restriction.  Pt states bp running low at home and it makes her dizzy.    Current Medications (verified): 1)  Triamterene-Hctz 75-50 Mg  Tabs (Triamterene-Hctz) .... Take One Tablet Daily 2)  Benazepril Hcl 20 Mg Tabs (Benazepril Hcl) .Marland Kitchen.. 1 By Mouth Once Daily 3)  Victoza 18 Mg/36ml Soln  (Liraglutide) .... 1.2 Mg Sq Once Daily 4)  One Touch Ultra Mini Strips .... Accu Check Two Times A Day 5)  Nexium 40 Mg Cpdr (Esomeprazole Magnesium) .Marland Kitchen.. 1 By Mouth Once Daily 6)  Symbicort 160-4.5 Mcg/act Aero (Budesonide-Formoterol Fumarate) .... 2 Puffs Two Times A Day 7)  Nascobal 500 Mcg/0.72ml Soln (Cyanocobalamin) .... Use One Spray in One Nostril Weekly. 8)  Onetouch Ultra System W/device Kit (Blood Glucose Monitoring Suppl) .... Use As Directed 9)  Lipitor 40 Mg Tabs (Atorvastatin Calcium) .Marland Kitchen.. 1 By Mouth At Bedtime. 10)  Claritin 10 Mg Tabs (Loratadine) .Marland Kitchen.. 1 By Mouth Once Daily 11)  Novofine 32g X 6 Mm Misc (Insulin Pen Needle) .... As Directed With Victoza  Allergies (verified): No Known Drug Allergies  Past History:  Past medical, surgical, family and social histories (including risk factors) reviewed for relevance to current acute and chronic problems.  Past Medical History: Reviewed history from 11/09/2007 and no changes required. GERD Hypertension Diabetes mellitus, type II Hyperlipidemia Current Problems:  URI (ICD-465.9) WELL ADULT (ICD-V70.0) OBESITY (ICD-278.00) BACK PAIN, ACUTE (ICD-724.5) ANKLE EDEMA, CHRONIC (ICD-782.3) ? of CHEST XRAY, ABNORMAL (ICD-793.1) HYPERLIPIDEMIA (ICD-272.4) DIABETES MELLITUS, TYPE II (ICD-250.00) HYPERTENSION (ICD-401.9) GERD (ICD-530.81)  Past Surgical History: Reviewed history from 07/20/2007 and no changes required. Hysterectomy right knee nodule  Family History: Reviewed history from 07/20/2007 and no changes required. 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   Lungs:  Normal respiratory effort, chest expands symmetrically.  Lungs are clear to auscultation, no crackles or wheezes. Heart:  normal rate and no murmur.   Psych:  Oriented X3 and normally interactive.     Impression & Recommendations:  Problem # 1:  DIABETES MELLITUS, TYPE II (ICD-250.00)  Her updated medication list for this problem includes:    Benazepril Hcl 20 Mg Tabs (Benazepril hcl) .Marland Kitchen... 1 by mouth once daily    Victoza 18 Mg/57ml Soln (Liraglutide) .Marland Kitchen... 1.2 mg sq once daily  Labs Reviewed: Creat: 0.9 (10/17/2009)    Reviewed HgBA1c results: 6.6 (10/17/2009)  6.2 (07/17/2009)  Problem # 2:  HYPERTENSION (ICD-401.9)  running low---decrease benazapril 20 mg  Her updated medication list for this problem includes:    Triamterene-hctz 75-50 Mg Tabs (Triamterene-hctz) .Marland Kitchen... Take one tablet daily    Benazepril Hcl 20 Mg Tabs (Benazepril hcl) .Marland Kitchen... 1 by mouth once daily  BP today: 118/82 Prior BP: 132/84 (10/17/2009)  Prior 10 Yr Risk Heart Disease: 7 % (11/09/2007)  Labs Reviewed: K+: 4.5 (10/17/2009) Creat: : 0.9 (10/17/2009)   Chol: 203 (10/17/2009)   HDL: 61.40 (10/17/2009)   LDL: 104 (03/13/2009)   TG: 45.0 (10/17/2009)  Complete Medication List: 1)  Triamterene-hctz 75-50 Mg Tabs (Triamterene-hctz) .... Take one tablet daily 2)  Benazepril Hcl 20 Mg Tabs (Benazepril hcl) .Marland Kitchen.. 1 by mouth once daily 3)  Victoza 18 Mg/68ml Soln (Liraglutide) .... 1.2 mg sq once daily 4)  One Touch Ultra Mini Strips  .... Accu check two times a day 5)  Nexium 40 Mg Cpdr (Esomeprazole magnesium) .Marland Kitchen.. 1 by mouth once daily 6)  Symbicort 160-4.5 Mcg/act Aero (Budesonide-formoterol fumarate) .... 2 puffs two times a day 7)  Nascobal 500 Mcg/0.21ml Soln (Cyanocobalamin) .... Use one spray in one nostril weekly. 8)  Onetouch Ultra System W/device Kit (Blood glucose monitoring suppl) .... Use as directed 9)  Lipitor 40 Mg Tabs (Atorvastatin calcium) .Marland Kitchen.. 1 by mouth at bedtime. 10)  Claritin 10 Mg Tabs (Loratadine) .Marland Kitchen.. 1 by mouth once daily 11)  Novofine  32g X 6 Mm Misc (Insulin pen needle) .... As directed with victoza  Other Orders: Radiology Referral (Radiology)  Patient Instructions: 1)  fasting labs in September---250.0 272.4, 401.9  bmp, hep, hgba1c,lipid, microalbumin 2)  ov 2weeks after labs Prescriptions: BENAZEPRIL HCL 20 MG TABS (BENAZEPRIL HCL) 1 by mouth once daily  #30 x 5   Entered and Authorized by:   Loreen Freud DO   Signed by:   Loreen Freud DO on 11/15/2009   Method used:   [facility]        [facility]  [address] [phone]* (retail)       [address]       [place], [place]  [address]       Ph: [phone] or [phone]       Fax: [phone]   RxID:   [id number]

## 2010-06-04 NOTE — Progress Notes (Signed)
Summary: patient returned calk/call pt back  Phone Note Outgoing Call   Call placed by: Army Fossa CMA,  July 30, 2009 3:47 PM Reason for Call: Discuss lab or test results Summary of Call: Regarding lab results, LMTCB:   Small LDL particle number elevated--- start Simcor 40mg / 500 1 by mouth at bedtime #30 , 2 refills-----take aspirin or tylenol about 30 min before med and take with low fat snack (to prevent flushing --side effect of med).------NMR, hep 272.4 Signed by Loreen Freud DO on 07/28/2009 at 1:31 PM   Follow-up for Phone Call        Pt is aware. Army Fossa CMA  July 30, 2009 4:25 PM     Additional Follow-up for Phone Call Additional follow up Details #2::    Patient is returning your call, Please give patient a return call Follow-up by: Barb Merino,  July 30, 2009 4:09 PM  New/Updated Medications: SIMCOR 500-40 MG XR24H-TAB (NIACIN-SIMVASTATIN) 1 by mouth at bedtime. Prescriptions: SIMCOR 500-40 MG XR24H-TAB (NIACIN-SIMVASTATIN) 1 by mouth at bedtime.  #30 x 2   Entered by:   Army Fossa CMA   Authorized by:   Loreen Freud DO   Signed by:   Army Fossa CMA on 07/30/2009   Method used:   [facility]        [facility]  [address] [phone]* (retail)       [address]       [place], [place]  [address]       Ph: [phone] or [phone]       Fax: [phone]   RxID:   [id number]

## 2010-06-04 NOTE — Progress Notes (Signed)
Summary: Referral/appt  Phone Note Call from Patient   Caller: Patient Call For: Loreen Freud DO Summary of Call: Pt called and states she needs referral to have a breast exam. Pt would like to know when she needs to be seen by you again also. Please advise. Army Fossa CMA  October 04, 2009 9:19 AM   Follow-up for Phone Call        is she having a problem?  We should see her if she does--- if just a screening we can order.     I need to see her in september for ov an labs---or sooner if she needs me Follow-up by: Loreen Freud DO,  October 04, 2009 9:25 AM  Additional Follow-up for Phone Call Additional follow up Details #1::        Pt states that she just needs a screening, she thinks she can make her own appt. Army Fossa CMA  October 04, 2009 9:41 AM

## 2010-06-04 NOTE — Assessment & Plan Note (Signed)
Summary: discuss victoza/drb   Vital Signs:  Patient profile:   50 year old female Weight:      341 pounds Pulse rate:   85 / minute Pulse rhythm:   regular BP sitting:   132 / 84  (left arm) Cuff size:   large  Vitals Entered By: Army Fossa CMA (October 17, 2009 8:07 AM) CC: Pt here to discuss Victoza   History of Present Illness: Pt c/o periodic N/V with certain foods -- Pt had orange tang and greasy meal last night and was sick all night +abd pain --epigastric area.  Pt is not taking anything OTC.    pt really here to discuss starting Victoza for DM and because she has had so much trouble losing weight.    Current Medications (verified): 1)  Triamterene-Hctz 75-50 Mg  Tabs (Triamterene-Hctz) .... Take One Tablet Daily 2)  Benazepril Hcl 40 Mg  Tabs (Benazepril Hcl) .Marland Kitchen.. 1 By Mouth Qd 3)  Victoza 18 Mg/20ml Soln (Liraglutide) .... 0.6 Mg Subcutaneously Once Daily For 1 Week Then in Crease To 1.2 Mg Daily 4)  One Touch Ultra Mini Strips .... Accu Check Two Times A Day 5)  Nexium 40 Mg Cpdr (Esomeprazole Magnesium) .Marland Kitchen.. 1 By Mouth Once Daily 6)  Symbicort 160-4.5 Mcg/act Aero (Budesonide-Formoterol Fumarate) .... 2 Puffs Two Times A Day 7)  Nascobal 500 Mcg/0.49ml Soln (Cyanocobalamin) .... Use One Spray in One Nostril Weekly. 8)  Onetouch Ultra System W/device Kit (Blood Glucose Monitoring Suppl) .... Use As Directed 9)  Lipitor 20 Mg Tabs (Atorvastatin Calcium) .Marland Kitchen.. 1 By Mouth At Bedtime 10)  Augmentin 875-125 Mg Tabs (Amoxicillin-Pot Clavulanate) .Marland Kitchen.. 1 By Mouth Two Times A Day 11)  Claritin 10 Mg Tabs (Loratadine) .Marland Kitchen.. 1 By Mouth Once Daily  Allergies (verified): No Known Drug Allergies  Past History:  Past medical, surgical, family and social histories (including risk factors) reviewed for relevance to current acute and chronic problems.  Past Medical History: Reviewed history from 11/09/2007 and no changes required. GERD Hypertension Diabetes mellitus, type  II Hyperlipidemia Current Problems:  URI (ICD-465.9) WELL ADULT (ICD-V70.0) OBESITY (ICD-278.00) BACK PAIN, ACUTE (ICD-724.5) ANKLE EDEMA, CHRONIC (ICD-782.3) ? of CHEST XRAY, ABNORMAL (ICD-793.1) HYPERLIPIDEMIA (ICD-272.4) DIABETES MELLITUS, TYPE II (ICD-250.00) HYPERTENSION (ICD-401.9) GERD (ICD-530.81)  Past Surgical History: Reviewed history from 07/20/2007 and no changes required. Hysterectomy right knee nodule  Family History: Reviewed history from 07/20/2007 and no changes required. 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 Psych:  Oriented X3 and normally interactive.     Impression & Recommendations:  Problem # 1:  DIABETES MELLITUS, TYPE II (ICD-250.00)  Her updated medication list for this problem includes:    Benazepril Hcl 40 Mg Tabs (Benazepril hcl) .Marland Kitchen... 1 by mouth qd    Victoza 18 Mg/13ml Soln (Liraglutide) .Marland Kitchen... 0.6 mg subcutaneously once daily for 1 week then in crease to 1.2 mg daily  Orders: Venipuncture (16109) TLB-B12 + Folate Pnl (60454_09811-B14/NWG) TLB-Lipid Panel (80061-LIPID) TLB-BMP (Basic Metabolic Panel-BMET) (80048-METABOL) TLB-CBC Platelet - w/Differential (85025-CBCD) TLB-Hepatic/Liver Function Pnl (80076-HEPATIC) TLB-A1C / Hgb A1C (Glycohemoglobin) (83036-A1C)  Labs Reviewed: Creat: 0.9 (07/17/2009)    Reviewed HgBA1c results: 6.2 (07/17/2009)  6.3 (03/13/2009)  Problem # 2:  DYSPEPSIA (ICD-536.8)  nexium 40 mg 1 by mouth once daily  GI referral  Orders: Gastroenterology Referral (GI)  Problem # 3:  NAUSEA WITH VOMITING  (ICD-787.01)  Orders: Gastroenterology Referral (GI)  Complete Medication List: 1)  Triamterene-hctz 75-50 Mg Tabs (Triamterene-hctz) .... Take one tablet daily 2)  Benazepril Hcl 40 Mg Tabs (Benazepril hcl) .Marland Kitchen.. 1 by mouth qd 3)  Victoza 18 Mg/80ml Soln (Liraglutide) .... 0.6 mg subcutaneously once daily for 1 week then in crease to 1.2 mg daily 4)  One Touch Ultra Mini Strips  .... Accu check two times a day 5)  Nexium 40 Mg Cpdr (Esomeprazole magnesium) .Marland Kitchen.. 1 by mouth once daily 6)  Symbicort 160-4.5 Mcg/act Aero (Budesonide-formoterol fumarate) .... 2 puffs two times a day 7)  Nascobal 500 Mcg/0.60ml Soln (Cyanocobalamin) .... Use one spray in one nostril weekly. 8)  Onetouch Ultra System W/device Kit (Blood glucose monitoring suppl) .... Use as directed 9)  Lipitor 20 Mg Tabs (Atorvastatin calcium) .Marland Kitchen.. 1 by mouth at bedtime 10)  Augmentin 875-125 Mg Tabs (Amoxicillin-pot clavulanate) .Marland Kitchen.. 1 by mouth two times a day 11)  Claritin 10 Mg Tabs (Loratadine) .Marland Kitchen.. 1 by mouth once daily 12)  Novofine 32g X 6 Mm Misc (Insulin pen needle) .... As directed with victoza Prescriptions: NOVOFINE 32G X 6 MM MISC (INSULIN PEN NEEDLE) as directed with Victoza  #1 box x 11   Entered and Authorized by:   Loreen Freud DO   Signed by:   Loreen Freud DO on 10/17/2009   Method used:   Print then Give to Patient   RxID:   [id number] VICTOZA 18 MG/3ML SOLN (LIRAGLUTIDE) 0.6 mg Subcutaneously once daily for 1 week then in crease to 1.2 mg daily  #1 x 2   Entered and Authorized by:   Loreen Freud DO   Signed by:   Loreen Freud DO on 10/17/2009   Method used:   Print then Give to Patient   RxID:   [id number]

## 2010-06-04 NOTE — Progress Notes (Signed)
Summary: med reaction  Phone Note Outgoing Call   Call placed by: [facility] CMA,  August 28, 2009 9:20 AM Summary of Call: pt left VM  that she has had a reaction to simcor. pt has d./c med and restarted old med again. pt would like a call back to discuss med. left message to call  office............Marland KitchenFelecia Deloach CMA  August 28, 2009 9:22 AM  pt return call stating that when she took med it felt like her skin was on fire and she began to get very hot. Advise pt that this is a side effect of med and to prevent this she will need to take aspirin or tylenol about 30 min before med and take with low fat snack, pt ok. pt advised if symptoms continues to give [name] a call back pt ok. copy of labs mailed...............Marland KitchenFelecia Deloach CMA  August 28, 2009 9:30 AM

## 2010-06-04 NOTE — Assessment & Plan Note (Signed)
Summary: backpain/kdc   Vital Signs:  Patient profile:   50 year old female Weight:      351 pounds BMI:     56.86 Temp:     97.9 degrees F oral Pulse rate:   86 / minute Pulse rhythm:   regular BP sitting:   120 / 80  (left arm) Cuff size:   large  Vitals Entered By: Mervin Hack CMA Duncan Dull) (May 26, 2009 9:56 AM) CC: backpain, Back Pain   History of Present Illness:       This is a 50 year old woman who presents with Back Pain.  The symptoms began 1 month ago.  Pt fell on ice the week after christmas ---her feet came out from under her and she landed on her bottom.  She was stiff next day then the more she worked the worse it got.  The patient denies fever, chills, weakness, loss of sensation, fecal incontinence, urinary incontinence, urinary retention, dysuria, rest pain, inability to work, and inability to care for self.  The pain is located in the right low back.  The pain began at home, gradually, and after a fall.  The pain is made worse by standing or walking.  The pain is made better by muscle relaxants.    Current Medications (verified): 1)  Triamterene-Hctz 75-50 Mg  Tabs (Triamterene-Hctz) .... Take One Tablet Daily 2)  Vytorin 10-40 Mg Tabs (Ezetimibe-Simvastatin) .Marland Kitchen.. 1 By Mouth At Bedtime 3)  Benazepril Hcl 40 Mg  Tabs (Benazepril Hcl) .Marland Kitchen.. 1 By Mouth Qd 4)  Amaryl 2 Mg  Tabs (Glimepiride) .Marland Kitchen.. 1 By Mouth Once Daily 5)  One Touch Ultra Mini Strips .... Accu Check Two Times A Day 6)  Prilosec Otc 20 Mg Tbec (Omeprazole Magnesium) .Marland Kitchen.. 1 By Mouth Q D 7)  Symbicort 160-4.5 Mcg/act Aero (Budesonide-Formoterol Fumarate) .... 2 Puffs Two Times A Day 8)  Nascobal 500 Mcg/0.82ml Soln (Cyanocobalamin) .... Use One Spray in One Nostril Weekly. 9)  Onetouch Ultra System W/device Kit (Blood Glucose Monitoring Suppl) .... Use As Directed 10)  Allegra 180 Mg Tabs (Fexofenadine Hcl) .Marland Kitchen.. 1 By Mouth Once Daily As Needed 11)  Flexeril 10 Mg Tabs (Cyclobenzaprine Hcl) .Marland Kitchen.. 1 By  Mouth Three Times A Day As Needed 12)  Vicodin Es 7.5-750 Mg Tabs (Hydrocodone-Acetaminophen) .Marland Kitchen.. 1 By Mouth Every 6 Hours As Needed 13)  Antivert 25 Mg Tabs (Meclizine Hcl) .Marland Kitchen.. 1 By Mouth Qid As Needed 14)  Promethazine Hcl 25 Mg Tabs (Promethazine Hcl) .Marland Kitchen.. 1 By Mouth Three Times A Day As Needed 15)  Augmentin 875-125 Mg Tabs (Amoxicillin-Pot Clavulanate) .... Two Times A Day For 10 Days 16)  Flonase 50 Mcg/act Susp (Fluticasone Propionate) .... Take 2 Sprays Each Nostril Once Daily  Allergies (verified): No Known Drug Allergies  Past History:  Past medical, surgical, family and social histories (including risk factors) reviewed for relevance to current acute and chronic problems.  Past Medical History: Reviewed history from 11/09/2007 and no changes required. GERD Hypertension Diabetes mellitus, type II Hyperlipidemia Current Problems:  URI (ICD-465.9) WELL ADULT (ICD-V70.0) OBESITY (ICD-278.00) BACK PAIN, ACUTE (ICD-724.5) ANKLE EDEMA, CHRONIC (ICD-782.3) ? of CHEST XRAY, ABNORMAL (ICD-793.1) HYPERLIPIDEMIA (ICD-272.4) DIABETES MELLITUS, TYPE II (ICD-250.00) HYPERTENSION (ICD-401.9) GERD (ICD-530.81)  Past Surgical History: Reviewed history from 07/20/2007 and no changes required. Hysterectomy right knee nodule  Family History: Reviewed history from 07/20/2007 and no changes required. 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   Neurologic:  alert & oriented X3, strength normal in all extremities, gait normal, and DTRs symmetrical and normal.  + slr  b/l      Impression & Recommendations:  Problem # 1:  BACK PAIN (ICD-724.5)  Her updated medication  list for this problem includes:    Flexeril 10 Mg Tabs (Cyclobenzaprine hcl) .Marland Kitchen... 1 by mouth three times a day as needed    Vicodin Es 7.5-750 Mg Tabs (Hydrocodone-acetaminophen) .Marland Kitchen... 1 by mouth every 6 hours as needed  Discussed use of moist heat or ice, modified activities, medications, and stretching/strengthening exercises. Back care instructions given. To be seen in 2 weeks if no improvement; sooner if worsening of symptoms.   Complete Medication List: 1)  Triamterene-hctz 75-50 Mg Tabs (Triamterene-hctz) .... Take one tablet daily 2)  Vytorin 10-40 Mg Tabs (Ezetimibe-simvastatin) .Marland Kitchen.. 1 by mouth at bedtime 3)  Benazepril Hcl 40 Mg Tabs (Benazepril hcl) .Marland Kitchen.. 1 by mouth qd 4)  Amaryl 2 Mg Tabs (Glimepiride) .Marland Kitchen.. 1 by mouth once daily 5)  One Touch Ultra Mini Strips  .... Accu check two times a day 6)  Prilosec Otc 20 Mg Tbec (Omeprazole magnesium) .Marland Kitchen.. 1 by mouth q d 7)  Symbicort 160-4.5 Mcg/act Aero (Budesonide-formoterol fumarate) .... 2 puffs two times a day 8)  Nascobal 500 Mcg/0.104ml Soln (Cyanocobalamin) .... Use one spray in one nostril weekly. 9)  Onetouch Ultra System W/device Kit (Blood glucose monitoring suppl) .... Use as directed 10)  Allegra 180 Mg Tabs (Fexofenadine hcl) .Marland Kitchen.. 1 by mouth once daily as needed 11)  Flexeril 10 Mg Tabs (Cyclobenzaprine hcl) .Marland Kitchen.. 1 by mouth three times a day as needed 12)  Vicodin Es 7.5-750 Mg Tabs (Hydrocodone-acetaminophen) .Marland Kitchen.. 1 by mouth every 6 hours as needed 13)  Antivert 25 Mg Tabs (Meclizine hcl) .Marland Kitchen.. 1 by mouth qid as needed 14)  Promethazine Hcl 25 Mg Tabs (Promethazine hcl) .Marland Kitchen.. 1 by mouth three times a day as needed 15)  Augmentin 875-125 Mg Tabs (Amoxicillin-pot clavulanate) .... Two times a day for 10 days 16)  Flonase 50 Mcg/act Susp (Fluticasone propionate) .... Take 2 sprays each nostril once daily Prescriptions: FLEXERIL 10 MG TABS (CYCLOBENZAPRINE HCL) 1 by mouth three times a day as needed  #30 x 0   Entered and  Authorized by:   Loreen Freud DO   Signed by:   Loreen Freud DO on 05/26/2009   Method used:   [facility]        [facility]  [address] [phone][facility] (retail)       [address]       [place], [place]  [address]       Ph: [phone] or [phone]       Fax: [phone]   RxID:   [id number]   Current Allergies (reviewed today): No known allergies

## 2010-06-04 NOTE — Progress Notes (Signed)
Summary: DM Med  Phone Note Call from Patient   Caller: Patient Summary of Call: Pt states that she was interested in trying the new DM med that you discussed with her. [facility] [address]. Army Fossa CMA  October 12, 2009 11:14 AM   Follow-up for Phone Call        Verdis Prime is what I talked to her about---- she would need ov so we can teach her how to give it. Follow-up by: Loreen Freud DO,  October 12, 2009 11:55 AM  Additional Follow-up for Phone Call Additional follow up Details #1::        Pt has appt on thursday. Army Fossa CMA  October 12, 2009 1:13 PM

## 2010-06-04 NOTE — Assessment & Plan Note (Signed)
Summary: rto 2 months/cbs   Vital Signs:  Patient profile:   50 year old female Weight:      337.8 pounds Temp:     98.6 degrees F oral Pulse rate:   80 / minute Pulse rhythm:   regular BP sitting:   120 / 82  (right arm)  Vitals Entered By: Almeta Monas CMA Duncan Dull) (January 18, 2010 1:34 PM) CC: 2 MO F/U   History of Present Illness:  Type 1 diabetes mellitus follow-up      This is a 50 year old woman who presents with Type 2 diabetes mellitus follow-up.  The patient denies polyuria, polydipsia, blurred vision, self managed hypoglycemia, hypoglycemia requiring help, weight loss, weight gain, and numbness of extremities.  The patient denies the following symptoms: neuropathic pain, chest pain, vomiting, orthostatic symptoms, poor wound healing, intermittent claudication, vision loss, and foot ulcer.  Since the last visit the patient reports good dietary compliance, compliance with medications, not exercising regularly, and monitoring blood glucose.  The patient has been measuring capillary blood glucose before dinner.  Since the last visit, the patient reports having had eye care by an ophthalmologist and foot care by a podiatrist.    Hyperlipidemia follow-up      The patient also presents for Hyperlipidemia follow-up.  The patient denies muscle aches, GI upset, abdominal pain, flushing, itching, constipation, diarrhea, and fatigue.  The patient denies the following symptoms: chest pain/pressure, exercise intolerance, dypsnea, palpitations, syncope, and pedal edema.  Compliance with medications (by patient report) has been near 100%.  Dietary compliance has been fair.  The patient reports no exercise.    Hypertension follow-up      The patient also presents for Hypertension follow-up.  The patient denies lightheadedness, urinary frequency, headaches, edema, impotence, rash, and fatigue.  The patient denies the following associated symptoms: chest pain, chest pressure, exercise  intolerance, dyspnea, palpitations, syncope, leg edema, and pedal edema.  Compliance with medications (by patient report) has been near 100%.  The patient reports that dietary compliance has been fair.  The patient reports no exercise.  Adjunctive measures currently used by the patient include salt restriction.    Current Medications (verified): 1)  Triamterene-Hctz 75-50 Mg  Tabs (Triamterene-Hctz) .... Take One Tablet Daily 2)  Benazepril Hcl 20 Mg Tabs (Benazepril Hcl) .Marland Kitchen.. 1 By Mouth Once Daily 3)  Victoza 18 Mg/59ml Soln (Liraglutide) .... 1.2 Mg Sq Once Daily 4)  One Touch Ultra Mini Strips .... Accu Check Two Times A Day 5)  Nexium 40 Mg Cpdr (Esomeprazole Magnesium) .Marland Kitchen.. 1 By Mouth Once Daily 6)  Symbicort 160-4.5 Mcg/act Aero (Budesonide-Formoterol Fumarate) .... 2 Puffs Two Times A Day 7)  Nascobal 500 Mcg/0.26ml Soln (Cyanocobalamin) .... Use One Spray in One Nostril Weekly. 8)  Onetouch Ultra System W/device Kit (Blood Glucose Monitoring Suppl) .... Use As Directed 9)  Lipitor 40 Mg Tabs (Atorvastatin Calcium) .Marland Kitchen.. 1 By Mouth At Bedtime. 10)  Claritin 10 Mg Tabs (Loratadine) .Marland Kitchen.. 1 By Mouth Once Daily 11)  Novofine 32g X 6 Mm Misc (Insulin Pen Needle) .... As Directed With Victoza  Allergies (verified): No Known Drug Allergies  Past History:  Past medical, surgical, family and social histories (including risk factors) reviewed for relevance to current acute and chronic problems.  Past Medical History: Reviewed history from 11/09/2007 and no changes required. GERD Hypertension Diabetes mellitus, type II Hyperlipidemia Current Problems:  URI (ICD-465.9) WELL ADULT (ICD-V70.0) OBESITY (ICD-278.00) BACK PAIN, ACUTE (ICD-724.5) ANKLE EDEMA, CHRONIC (ICD-782.3) ? of  CHEST XRAY, ABNORMAL (ICD-793.1) HYPERLIPIDEMIA (ICD-272.4) DIABETES MELLITUS, TYPE II (ICD-250.00) HYPERTENSION (ICD-401.9) GERD (ICD-530.81)  Past Surgical History: Reviewed history from 07/20/2007 and no  changes required. Hysterectomy right knee nodule  Family History: Reviewed history from 07/20/2007 and no changes required. 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   Neck:  No deformities, masses, or tenderness noted. Lungs:  Normal respiratory effort, chest expands symmetrically. Lungs are clear to auscultation, no crackles or wheezes. Heart:  normal rate and no murmur.   Psych:  Oriented X3 and normally interactive.     Impression & Recommendations:  Problem # 1:  DIABETES MELLITUS, TYPE II (ICD-250.00)  Her updated medication list for this problem includes:    Benazepril Hcl 20 Mg Tabs (Benazepril hcl) .Marland Kitchen... 1 by mouth once daily    Victoza 18 Mg/59ml Soln (Liraglutide) .Marland Kitchen... 1.8 mg sq once daily  Labs Reviewed: Creat: 0.9 (01/04/2010)    Reviewed HgBA1c results: 6.2 (01/04/2010)  6.6 (10/17/2009)  Problem # 2:  HYPERLIPIDEMIA (ICD-272.4)  Her updated medication list for this problem includes:    Lipitor 40 Mg Tabs (Atorvastatin calcium) .Marland Kitchen... 1 by mouth at bedtime.  Labs Reviewed: SGOT: 19 (01/04/2010)   SGPT: 20 (01/04/2010)  Prior 10 Yr Risk Heart Disease: 7 % (11/09/2007)   HDL:46.80 (01/04/2010), 61.40 (10/17/2009)  LDL:61 (01/04/2010), 104 (36/64/4034)  Chol:113 (01/04/2010), 203 (10/17/2009)  Trig:28.0 (01/04/2010), 45.0 (10/17/2009)  Problem # 3:  HYPERTENSION (ICD-401.9)  Her updated medication list for this problem includes:    Triamterene-hctz 75-50 Mg Tabs (Triamterene-hctz) .Marland Kitchen... Take one tablet daily    Benazepril Hcl 20 Mg Tabs (Benazepril hcl) .Marland Kitchen... 1 by mouth once daily  BP today: 120/82 Prior BP: 118/82 (11/15/2009)  Prior 10 Yr Risk Heart Disease: 7 %  (11/09/2007)  Labs Reviewed: K+: 4.1 (01/04/2010) Creat: : 0.9 (01/04/2010)   Chol: 113 (01/04/2010)   HDL: 46.80 (01/04/2010)   LDL: 61 (01/04/2010)   TG: 28.0 (01/04/2010)  Complete Medication List: 1)  Triamterene-hctz 75-50 Mg Tabs (Triamterene-hctz) .... Take one tablet daily 2)  Benazepril Hcl 20 Mg Tabs (Benazepril hcl) .Marland Kitchen.. 1 by mouth once daily 3)  Victoza 18 Mg/75ml Soln (Liraglutide) .... 1.8 mg sq once daily 4)  One Touch Ultra Mini Strips  .... Accu check two times a day 5)  Nexium 40 Mg Cpdr (Esomeprazole magnesium) .Marland Kitchen.. 1 by mouth once daily 6)  Symbicort 160-4.5 Mcg/act Aero (Budesonide-formoterol fumarate) .... 2 puffs two times a day 7)  Nascobal 500 Mcg/0.29ml Soln (Cyanocobalamin) .... Use one spray in one nostril weekly. 8)  Onetouch Ultra System W/device Kit (Blood glucose monitoring suppl) .... Use as directed 9)  Lipitor 40 Mg Tabs (Atorvastatin calcium) .Marland Kitchen.. 1 by mouth at bedtime. 10)  Claritin 10 Mg Tabs (Loratadine) .Marland Kitchen.. 1 by mouth once daily 11)  Novofine 32g X 6 Mm Misc (Insulin pen needle) .... As directed with victoza  Patient Instructions: 1)  Please schedule a follow-up appointment in 6 months .  Prescriptions: NEXIUM 40 MG CPDR (ESOMEPRAZOLE MAGNESIUM) 1 by mouth once daily  #30 x 5   Entered and Authorized by:   Loreen Freud DO   Signed by:   Loreen Freud DO on 01/18/2010   Method used:   Electron[facility] to  [facility] [phone]* (retail)       [address]       [place], [place]  [address]       Ph: [phone] or [phone]       Fax: [phone]   RxID:   [id number] VICTOZA 18 MG/3ML SOLN (LIRAGLUTIDE) 1.8 mg SQ once daily  #3 months x 3   Entered and Authorized by:   Loreen Freud DO   Signed by:   Loreen Freud DO on 01/18/2010   Method used:   [facility]        [facility]  [address] [phone]* (retail)       [address]       [place], [place]  [address]       Ph: [phone] or  [phone]       Fax: [phone]   RxID:   [id number] VICTOZA 18 MG/3ML SOLN (LIRAGLUTIDE) 1.2 mg SQ once daily  #3 month x 3   Entered and Authorized by:   Loreen Freud DO   Signed by:   Loreen Freud DO on 01/18/2010   Method used:   [facility]        [facility]  [address] [phone]* (retail)       [address]       [place], [place]  [address]       Ph: [phone] or [phone]       Fax: [phone]   RxID:   [id number]

## 2010-06-04 NOTE — Progress Notes (Signed)
Summary: refill  Phone Note Refill Request Message from:  Fax from Pharmacy on [facility] fax [phone]  Refills Requested: Medication #1:  BENAZEPRIL HCL 40 MG  TABS 1 by mouth qd Initial call taken by: Barb Merino,  May 14, 2009 8:23 AM    Prescriptions: BENAZEPRIL HCL 40 MG  TABS (BENAZEPRIL HCL) 1 by mouth qd  #30 x 2   Entered by:   Army Fossa CMA   Authorized by:   Loreen Freud DO   Signed by:   Army Fossa CMA on 05/14/2009   Method used:   [facility]        [facility]  [address] [phone]* (retail)       [address]       [place], [place]  [address]       Ph: [phone] or [phone]       Fax: [phone]   RxID:   [id number]

## 2010-06-04 NOTE — Progress Notes (Signed)
Summary: Lab Results  Phone Note Outgoing Call   Call placed by: Army Fossa CMA,  October 19, 2009 9:26 AM Reason for Call: Discuss lab or test results Summary of Call: Regarding lab results, LMTCB:  Ideally your LDL (bad cholesterol) should be <_70___, your HDL (good cholesterol) should be >__50_ and your triglycerides should be< 150.  Diet and exercise will increase HDL and decrease the LDL and triglycerides. Read Dr. Danice Goltz book--Eat Drink and Be Healthy.  We will recheck labs in __3_ months.  Increase Lipitor 40 mg 1 by mouth at bedtime, 2 refills----give  $ 4 copay card----272.4  lipids, hep Glucose, hgba1c slightly elevated but victoza was just started----repeat 3 months----250.00 hgba1c, bmp Signed by Loreen Freud DO on 10/18/2009 at 10:07 PM   Follow-up for Phone Call        Pt is aware. Army Fossa CMA  October 19, 2009 1:49 PM     New/Updated Medications: LIPITOR 40 MG TABS (ATORVASTATIN CALCIUM) 1 by mouth at bedtime. Prescriptions: LIPITOR 40 MG TABS (ATORVASTATIN CALCIUM) 1 by mouth at bedtime.  #30 x 2   Entered by:   Army Fossa CMA   Authorized by:   Loreen Freud DO   Signed by:   Army Fossa CMA on 10/19/2009   Method used:   [facility]        [facility]  [address] [phone]* (retail)       [address]       [place], [place]  [address]       Ph: [phone] or [phone]       Fax: [phone]   RxID:   [id number]

## 2010-06-04 NOTE — Assessment & Plan Note (Signed)
Summary: dizzy spells//lch   Vital Signs:  Patient profile:   50 year old female Weight:      347 pounds Pulse rate:   87 / minute Pulse rhythm:   regular BP sitting:   110 / 84  (left arm) Cuff size:   large  Vitals Entered By: Army Fossa CMA (October 08, 2009 11:05 AM) CC: Pt here c/o dizzy spells, BS was 104, BP was 112/87 (on saturday) Pt c/o ear pain, sinus pressure, URI symptoms   History of Present Illness:       This is a 50 year old woman who presents with URI symptoms.  Pt here c/o sinus pressure and lightheaded feeling---bp and BS normal at home.  The patient complains of nasal congestion, productive cough, and earache, but denies clear nasal discharge, purulent nasal discharge, sore throat, dry cough, and sick contacts.  Associated symptoms include low-grade fever (<100.5 degrees).  The patient also reports headache.  The patient denies the following risk factors for Strep sinusitis: unilateral facial pain, unilateral nasal discharge, poor response to decongestant, double sickening, tooth pain, Strep exposure, tender adenopathy, and absence of cough.    Pt also c/o swelling in legs with glyburide---metformin caused a problem in past as well.  Pt is asking about alternative.  Current Medications (verified): 1)  Triamterene-Hctz 75-50 Mg  Tabs (Triamterene-Hctz) .... Take One Tablet Daily 2)  Benazepril Hcl 40 Mg  Tabs (Benazepril Hcl) .Marland Kitchen.. 1 By Mouth Qd 3)  Amaryl 2 Mg  Tabs (Glimepiride) .Marland Kitchen.. 1 By Mouth Once Daily 4)  One Touch Ultra Mini Strips .... Accu Check Two Times A Day 5)  Prilosec Otc 20 Mg Tbec (Omeprazole Magnesium) .Marland Kitchen.. 1 By Mouth Q D 6)  Symbicort 160-4.5 Mcg/act Aero (Budesonide-Formoterol Fumarate) .... 2 Puffs Two Times A Day 7)  Nascobal 500 Mcg/0.25ml Soln (Cyanocobalamin) .... Use One Spray in One Nostril Weekly. 8)  Onetouch Ultra System W/device Kit (Blood Glucose Monitoring Suppl) .... Use As Directed 9)  Lipitor 20 Mg Tabs (Atorvastatin Calcium) .Marland Kitchen..  1 By Mouth At Bedtime 10)  Augmentin 875-125 Mg Tabs (Amoxicillin-Pot Clavulanate) .Marland Kitchen.. 1 By Mouth Two Times A Day 11)  Flonase 50 Mcg/act Susp (Fluticasone Propionate) .... 2 Sprays Each Nostril Once Daily 12)  Claritin 10 Mg Tabs (Loratadine) .Marland Kitchen.. 1 By Mouth Once Daily  Allergies (verified): No Known Drug Allergies  Past History:  Past medical, surgical, family and social histories (including risk factors) reviewed for relevance to current acute and chronic problems.  Past Medical History: Reviewed history from 11/09/2007 and no changes required. GERD Hypertension Diabetes mellitus, type II Hyperlipidemia Current Problems:  URI (ICD-465.9) WELL ADULT (ICD-V70.0) OBESITY (ICD-278.00) BACK PAIN, ACUTE (ICD-724.5) ANKLE EDEMA, CHRONIC (ICD-782.3) ? of CHEST XRAY, ABNORMAL (ICD-793.1) HYPERLIPIDEMIA (ICD-272.4) DIABETES MELLITUS, TYPE II (ICD-250.00) HYPERTENSION (ICD-401.9) GERD (ICD-530.81)  Past Surgical History: Reviewed history from 07/20/2007 and no changes required. Hysterectomy right knee nodule  Family History: Reviewed history from 07/20/2007 and no changes required. 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  Otoscopic examination reveals clear canals, tympanic membranes are intact bilaterally without bulging, retraction, inflammation or discharge. Hearing is grossly normal bilaterally. Nose:  L maxillary sinus tenderness and R maxillary sinus  tenderness.   Mouth:  Oral mucosa and oropharynx without lesions or exudates.  Teeth in good repair. Neck:  No deformities, masses, or tenderness noted. Lungs:  Normal respiratory  effort, chest expands symmetrically. Lungs are clear to auscultation, no crackles or wheezes. Heart:  normal rate and no murmur.   Extremities:  left pretibial edema and right pretibial edema.     Impression & Recommendations:  Problem # 1:  SINUSITIS - ACUTE-NOS (ICD-461.9)  The following medications were removed from the medication list:    Tussionex Pennkinetic Er 8-10 Mg/21ml Lqcr (Chlorpheniramine-hydrocodone) .Marland Kitchen... 1 tsp by mouth at bedtime prn Her updated medication list for this problem includes:    Augmentin 875-125 Mg Tabs (Amoxicillin-pot clavulanate) .Marland Kitchen... 1 by mouth two times a day    Flonase 50 Mcg/act Susp (Fluticasone propionate) .Marland Kitchen... 2 sprays each nostril once daily  Instructed on treatment. Call if symptoms persist or worsen.   Problem # 2:  DIABETES MELLITUS, TYPE II (ICD-250.00) d/w pt possibility of victoza or byetta---victoza info given to pt ---she will think about it Her updated medication list for this problem includes:    Benazepril Hcl 40 Mg Tabs (Benazepril hcl) .Marland Kitchen... 1 by mouth qd    Amaryl 2 Mg Tabs (Glimepiride) .Marland Kitchen... 1 by mouth once daily  Labs Reviewed: Creat: 0.9 (07/17/2009)    Reviewed HgBA1c results: 6.2 (07/17/2009)  6.3 (03/13/2009)  Complete Medication List: 1)  Triamterene-hctz 75-50 Mg Tabs (Triamterene-hctz) .... Take one tablet daily 2)  Benazepril Hcl 40 Mg Tabs (Benazepril hcl) .Marland Kitchen.. 1 by mouth qd 3)  Amaryl 2 Mg Tabs (Glimepiride) .Marland Kitchen.. 1 by mouth once daily 4)  One Touch Ultra Mini Strips  .... Accu check two times a day 5)  Prilosec Otc 20 Mg Tbec (Omeprazole magnesium) .Marland Kitchen.. 1 by mouth q d 6)  Symbicort 160-4.5 Mcg/act Aero (Budesonide-formoterol fumarate) .... 2 puffs two times a day 7)  Nascobal 500 Mcg/0.78ml Soln (Cyanocobalamin) .... Use one spray in one nostril weekly. 8)  Onetouch Ultra System W/device Kit (Blood glucose monitoring suppl) .... Use as directed 9)  Lipitor 20 Mg Tabs (Atorvastatin calcium) .Marland Kitchen.. 1 by mouth at  bedtime 10)  Augmentin 875-125 Mg Tabs (Amoxicillin-pot clavulanate) .Marland Kitchen.. 1 by mouth two times a day 11)  Flonase 50 Mcg/act Susp (Fluticasone propionate) .... 2 sprays each nostril once daily 12)  Claritin 10 Mg Tabs (Loratadine) .Marland Kitchen.. 1 by mouth once daily Prescriptions: LIPITOR 20 MG TABS (ATORVASTATIN CALCIUM) 1 by mouth at bedtime  #30 x 2   Entered and Authorized by:   Loreen Freud DO   Signed by:   Loreen Freud DO on 10/08/2009   Method used:   Print then Give to Patient   RxID:   [id number] CLARITIN 10 MG TABS (LORATADINE) 1 by mouth once daily  #30 x 2   Entered and Authorized by:   Loreen Freud DO   Signed by:   Loreen Freud DO on 10/08/2009   Method used:   [facility]        [facility] [address] [phone]* (retail)       [address]       [place], [place]  [address]       Ph: [phone] or [phone]       Fax: [phone]   RxID:   [id number] FLONASE 50 MCG/ACT SUSP (FLUTICASONE PROPIONATE) 2 sprays each nostril once daily  #1 x 1   Entered and Authorized  by:   Loreen Freud DO   Signed by:   Loreen Freud DO on 10/08/2009   Method used:   [facility]        [facility] [phone]* (retail)       [address]       [place], [place]  [address]       Ph: [phone] or [phone]       Fax: [phone]   RxID:   [id number] AUGMENTIN 875-125 MG TABS (AMOXICILLIN-POT CLAVULANATE) 1 by mouth two times a day  #20 x 0   Entered and Authorized by:   Loreen Freud DO   Signed by:   Loreen Freud DO on 10/08/2009   Method used:   [facility]        [facility] [phone]* (retail)       [address]       [place], [place]  [address]       Ph: [phone] or [phone]       Fax: [phone]   RxID:   [id number]

## 2010-06-04 NOTE — Assessment & Plan Note (Signed)
Summary: f/u on blood work//wt loss program//lch   Vital Signs:  Patient profile:   50 year old female Weight:      350 pounds Temp:     98.4 degrees F oral Pulse rate:   88 / minute Pulse rhythm:   regular BP sitting:   126 / 86  (left arm) Cuff size:   large  Vitals Entered By: Army Fossa CMA (July 31, 2009 11:06 AM) CC: Pt here to follow up on bloodwork, discuss wt loss program, has chest congestion., URI symptoms   History of Present Illness:       This is a 50 year old woman who presents with URI symptoms.  The symptoms began 4 days ago.  The patient complains of nasal congestion, purulent nasal discharge, productive cough, and sick contacts.  Associated symptoms include fever and wheezing.  The patient denies low-grade fever (<100.5 degrees), fever of 100.5-103 degrees, fever of 103.1-104 degrees, fever to >104 degrees, stiff neck, dyspnea, rash, vomiting, diarrhea, use of an antipyretic, and response to antipyretic.  The patient also reports headache.  The patient denies itchy watery eyes, itchy throat, sneezing, seasonal symptoms, response to antihistamine, muscle aches, and severe fatigue.  The patient denies the following risk factors for Strep sinusitis: unilateral facial pain, unilateral nasal discharge, poor response to decongestant, double sickening, tooth pain, Strep exposure, tender adenopathy, and absence of cough.  + B/L sinus pressure  Pt also here to review labs and discuss weight loss.  Pt can not afford optifast --she may try Weight watchers  Allergies (verified): No Known Drug Allergies  Past History:  Past medical, surgical, family and social histories (including risk factors) reviewed for relevance to current acute and chronic problems.  Past Medical History: Reviewed history from 11/09/2007 and no changes required. GERD Hypertension Diabetes mellitus, type II Hyperlipidemia Current Problems:  URI (ICD-465.9) WELL ADULT (ICD-V70.0) OBESITY  (ICD-278.00) BACK PAIN, ACUTE (ICD-724.5) ANKLE EDEMA, CHRONIC (ICD-782.3) ? of CHEST XRAY, ABNORMAL (ICD-793.1) HYPERLIPIDEMIA (ICD-272.4) DIABETES MELLITUS, TYPE II (ICD-250.00) HYPERTENSION (ICD-401.9) GERD (ICD-530.81)  Past Surgical History: Reviewed history from 07/20/2007 and no changes required. Hysterectomy right knee nodule  Family History: Reviewed history from 07/20/2007 and no changes required. 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  Otoscopic examination reveals clear canals, tympanic membranes are intact bilaterally without bulging, retraction, inflammation or discharge. Hearing is grossly normal bilaterally. Nose:  External nasal examination shows no deformity or inflammation. Nasal mucosa are pink and moist without lesions or exudates. Mouth:  Oral mucosa and oropharynx without lesions or exudates.  Teeth in good repair. Neck:  No deformities, masses, or tenderness noted. Lungs:  R wheezes and L wheezes.  --- improved with neb Heart:  normal rate and no murmur.   Extremities:  No clubbing, cyanosis, edema, or deformity noted with normal full range of motion of all joints.   Psych:  Oriented X3 and normally interactive.                   Impression & Recommendations:  Problem # 1:  ACUTE BRONCHITIS (ICD-466.0)  The following medications were removed from the medication list:    Augmentin 875-125 Mg Tabs (Amoxicillin-pot clavulanate) .Marland Kitchen..Marland Kitchen Two times a  day for 10 days Her updated medication list for this problem includes:    Symbicort 160-4.5 Mcg/act Aero (Budesonide-formoterol fumarate) .Marland Kitchen... 2 puffs two times a day     Zithromax Z-pak 250 Mg Tabs (Azithromycin) .Marland Kitchen... As directed    Tussionex Pennkinetic Er 8-10 Mg/50ml Lqcr (Chlorpheniramine-hydrocodone) .Marland Kitchen... 1 tsp by mouth at bedtime prn  Orders: Nebulizer Tx (08657)  Take antibiotics and other medications as directed. Encouraged to push clear liquids, get enough rest, and take acetaminophen as needed. To be seen in 5-7 days if no improvement, sooner if worse.  Problem # 2:  DIABETES MELLITUS, TYPE II (ICD-250.00)  Her updated medication list for this problem includes:    Benazepril Hcl 40 Mg Tabs (Benazepril hcl) .Marland Kitchen... 1 by mouth qd    Amaryl 2 Mg Tabs (Glimepiride) .Marland Kitchen... 1 by mouth once daily  Labs Reviewed: Creat: 0.9 (07/17/2009)    Reviewed HgBA1c results: 6.2 (07/17/2009)  6.3 (03/13/2009)  Problem # 3:  HYPERLIPIDEMIA (ICD-272.4)  The following medications were removed from the medication list:    Vytorin 10-40 Mg Tabs (Ezetimibe-simvastatin) .Marland Kitchen... 1 by mouth at bedtime Her updated medication list for this problem includes:    Simcor 500-40 Mg Xr24h-tab (Niacin-simvastatin) .Marland Kitchen... 1 by mouth at bedtime.  Labs Reviewed: SGOT: 15 (07/17/2009)   SGPT: 18 (07/17/2009)  Prior 10 Yr Risk Heart Disease: 7 % (11/09/2007)   HDL:50.50 (03/13/2009), 54.40 (01/01/2009)  LDL:104 (03/13/2009), 76 (84/69/6295)  Chol:160 (03/13/2009), 140 (01/01/2009)  Trig:30.0 (03/13/2009), 47.0 (01/01/2009)  Problem # 4:  OBESITY (ICD-278.00) Pt to try weight watchers--- and exercise Ht: 66 (12/27/2008)   Wt: 350 (07/31/2009)   BMI: 56.86 (05/26/2009)  Problem # 5:  HYPERTENSION (ICD-401.9)  Her updated medication list for this problem includes:    Triamterene-hctz 75-50 Mg Tabs (Triamterene-hctz) .Marland Kitchen... Take one tablet daily    Benazepril Hcl 40 Mg Tabs (Benazepril hcl) .Marland Kitchen... 1 by mouth qd  BP today: 126/86 Prior BP: 120/80 (05/26/2009)  Prior 10 Yr Risk Heart Disease: 7 % (11/09/2007)  Labs Reviewed: K+: 3.6 (07/17/2009) Creat: : 0.9 (07/17/2009)   Chol:  160 (03/13/2009)   HDL: 50.50 (03/13/2009)   LDL: 104 (03/13/2009)   TG: 30.0 (03/13/2009)  Complete Medication List: 1)  Triamterene-hctz 75-50 Mg Tabs (Triamterene-hctz) .... Take one tablet daily 2)  Benazepril Hcl 40 Mg Tabs (Benazepril hcl) .Marland Kitchen.. 1 by mouth qd 3)  Amaryl 2 Mg Tabs (Glimepiride) .Marland Kitchen.. 1 by mouth once daily 4)  One Touch Ultra Mini Strips  .... Accu check two times a day 5)  Prilosec Otc 20 Mg Tbec (Omeprazole magnesium) .Marland Kitchen.. 1 by mouth q d 6)  Symbicort 160-4.5 Mcg/act Aero (Budesonide-formoterol fumarate) .... 2 puffs two times a day 7)  Nascobal 500 Mcg/0.66ml Soln (Cyanocobalamin) .... Use one spray in one nostril weekly. 8)  Onetouch Ultra System W/device Kit (Blood glucose monitoring suppl) .... Use as directed 9)  Allegra 180 Mg Tabs (Fexofenadine hcl) .Marland Kitchen.. 1 by mouth once daily as needed 10)  Antivert 25 Mg Tabs (Meclizine hcl) .Marland Kitchen.. 1 by mouth qid as needed 11)  Simcor 500-40 Mg Xr24h-tab (Niacin-simvastatin) .Marland Kitchen.. 1 by mouth at bedtime. 12)  Zithromax Z-pak 250 Mg Tabs (Azithromycin) .... As directed 13)  Tussionex Pennkinetic Er 8-10 Mg/58ml Lqcr (Chlorpheniramine-hydrocodone) .Marland Kitchen.. 1 tsp by mouth at bedtime prn Prescriptions: TUSSIONEX PENNKINETIC ER 8-10 MG/5ML LQCR (CHLORPHENIRAMINE-HYDROCODONE) 1 tsp by mouth at bedtime prn  #6 oz x 0   Entered and Authorized by:   Loreen Freud DO  Signed by:   Loreen Freud DO on 07/31/2009   Method used:   Print then Give to Patient   RxID:   [id number] SYMBICORT 160-4.5 MCG/ACT AERO (BUDESONIDE-FORMOTEROL FUMARATE) 2 puffs two times a day  #1 x 11   Entered and Authorized by:   Loreen Freud DO   Signed by:   Loreen Freud DO on 07/31/2009   Method used:   [facility] to        [facility]  [address] [phone]* (retail)       [address]       [place], [place]  [address]       Ph: [phone] or [phone]       Fax: [phone]   RxID:   [id number] ZITHROMAX Z-PAK 250 MG TABS (AZITHROMYCIN)  as directed  #1 x 0   Entered and Authorized by:   Loreen Freud DO   Signed by:   Loreen Freud DO on 07/31/2009   Method used:   [facility] to        [facility]  [address] [phone]* (retail)       [address]       [place], [place]  [address]       Ph: [phone] or [phone]       Fax: [phone]   RxID:   [id number]    Medication Administration  Medication # 1:    Medication: Albuterol Sulfate Sol 1mg  unit dose    Diagnosis: ACUTE BRONCHITIS (ICD-466.0)    Exp Date: 04/2011    Lot #: [id number]  Orders Added: 1)  Nebulizer Tx [79024] 2)  Est. Patient Level IV [09735]

## 2010-06-06 NOTE — Assessment & Plan Note (Signed)
Summary: back pain/nta   Vital Signs:  Patient profile:   50 year old female Height:      66 inches Weight:      340.0 pounds BMI:     55.08 Temp:     98.1 degrees F oral BP sitting:   142 / 86  (left arm) Cuff size:   large  Vitals Entered By: Almeta Monas CMA Duncan Dull) (May 30, 2010 8:37 AM) CC: c/o lower back pain on the right side x4days---not sure if she pulled it again, Back Pain   History of Present Illness:       This is a 50 year old woman who presents with Back Pain.  The symptoms began 5 days ago.  Pt c/o spasm R Low back since Sunday.  No known injury.  The patient denies fever, chills, weakness, loss of sensation, fecal incontinence, urinary incontinence, urinary retention, dysuria, rest pain, inability to work, and inability to care for self.  The pain is located in the right low back.  The pain began at home and suddenly.  The pain is made worse by urination.  The pain is made better by muscle relaxants and heat.    Problems Prior to Update: 1)  Preventive Health Care  (ICD-V70.0) 2)  Dyspepsia  (ICD-536.8) 3)  Sinusitis - Acute-nos  (ICD-461.9) 4)  Acute Bronchitis  (ICD-466.0) 5)  Back Pain  (ICD-724.5) 6)  Nausea With Vomiting  (ICD-787.01) 7)  Dizziness  (ICD-780.4) 8)  Back Pain, Lumbar, With Radiculopathy  (ICD-724.4) 9)  Back Pain  (ICD-724.5) 10)  Wrist Pain, Right  (ICD-719.43) 11)  Foot Pain, Right  (ICD-729.5) 12)  Vitamin B12 Deficiency  (ICD-266.2) 13)  Syncope  (ICD-780.2) 14)  Sinusitis- Acute-nos  (ICD-461.9) 15)  Abdominal Pain, Epigastric  (ICD-789.06) 16)  Calf Pain, Right  (ICD-729.5) 17)  Ingrowing Nail  (ICD-703.0) 18)  Gastroenteritis, Viral  (ICD-008.8) 19)  Uri  (ICD-465.9) 20)  Well Adult  (ICD-V70.0) 21)  Obesity  (ICD-278.00) 22)  Back Pain, Acute  (ICD-724.5) 23)  Ankle Edema, Chronic  (ICD-782.3) 24)  ? of Chest Xray, Abnormal  (ICD-793.1) 25)  Hyperlipidemia  (ICD-272.4) 26)  Diabetes Mellitus, Type II  (ICD-250.00) 27)   Hypertension  (ICD-401.9) 28)  Gerd  (ICD-530.81)  Medications Prior to Update: 1)  Triamterene-Hctz 75-50 Mg  Tabs (Triamterene-Hctz) .... Take One Tablet Daily 2)  Benazepril Hcl 20 Mg Tabs (Benazepril Hcl) .Marland Kitchen.. 1 By Mouth Once Daily 3)  Victoza 18 Mg/16ml Soln (Liraglutide) .... 1.8 Mg Sq Once Daily 4)  One Touch Ultra Mini Strips .... Accu Check Two Times A Day 5)  Nexium 40 Mg Cpdr (Esomeprazole Magnesium) .Marland Kitchen.. 1 By Mouth Once Daily 6)  Symbicort 160-4.5 Mcg/act Aero (Budesonide-Formoterol Fumarate) .... 2 Puffs Two Times A Day 7)  Nascobal 500 Mcg/0.15ml Soln (Cyanocobalamin) .... Use One Spray in One Nostril Weekly. 8)  Onetouch Ultra System W/device Kit (Blood Glucose Monitoring Suppl) .... Use As Directed 9)  Lipitor 40 Mg Tabs (Atorvastatin Calcium) .Marland Kitchen.. 1 By Mouth At Bedtime. 10)  Claritin 10 Mg Tabs (Loratadine) .Marland Kitchen.. 1 By Mouth Once Daily 11)  Novofine 32g X 6 Mm Misc (Insulin Pen Needle) .... As Directed With Victoza  Current Medications (verified): 1)  Triamterene-Hctz 75-50 Mg  Tabs (Triamterene-Hctz) .... Take One Tablet Daily 2)  Benazepril Hcl 20 Mg Tabs (Benazepril Hcl) .Marland Kitchen.. 1 By Mouth Once Daily 3)  Victoza 18 Mg/30ml Soln (Liraglutide) .... 1.8 Mg Sq Once Daily 4)  One Touch Ultra  Mini Strips .... Accu Check Two Times A Day 5)  Nexium 40 Mg Cpdr (Esomeprazole Magnesium) .Marland Kitchen.. 1 By Mouth Once Daily 6)  Onetouch Ultra System W/device Kit (Blood Glucose Monitoring Suppl) .... Use As Directed 7)  Lipitor 40 Mg Tabs (Atorvastatin Calcium) .Marland Kitchen.. 1 By Mouth At Bedtime. 8)  Novofine 32g X 6 Mm Misc (Insulin Pen Needle) .... As Directed With Victoza  Allergies (verified): No Known Drug Allergies  Past History:  Past medical, surgical, family and social histories (including risk factors) reviewed for relevance to current acute and chronic problems.  Past Medical History: Reviewed history from 11/09/2007 and no changes required. GERD Hypertension Diabetes mellitus, type  II Hyperlipidemia Current Problems:  URI (ICD-465.9) WELL ADULT (ICD-V70.0) OBESITY (ICD-278.00) BACK PAIN, ACUTE (ICD-724.5) ANKLE EDEMA, CHRONIC (ICD-782.3) ? of CHEST XRAY, ABNORMAL (ICD-793.1) HYPERLIPIDEMIA (ICD-272.4) DIABETES MELLITUS, TYPE II (ICD-250.00) HYPERTENSION (ICD-401.9) GERD (ICD-530.81)  Past Surgical History: Reviewed history from 07/20/2007 and no changes required. Hysterectomy right knee nodule  Family History: Reviewed history from 07/20/2007 and no changes required. 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   Extremities:  No clubbing, cyanosis, edema, or deformity noted with normal full range of motion of all joints.   Neurologic:  alert & oriented X3, strength normal in all extremities, gait normal, and DTRs symmetrical and normal.   Psych:  Oriented X3 and normally interactive.     Impression & Recommendations:  Problem # 1:  BACK PAIN (ICD-724.5)  Her updated medication list for this problem includes:    Flexeril 5 Mg Tabs (Cyclobenzaprine hcl) .Marland Kitchen... 1 by mouth three times a day as needed  Discussed use of moist heat or ice, modified activities, medications, and stretching/strengthening exercises. Back care instructions given. To be seen in 2 weeks if no improvement; sooner if worsening of symptoms.   Complete Medication List: 1)  Triamterene-hctz 75-50 Mg Tabs (Triamterene-hctz) .... Take one tablet daily 2)  Benazepril Hcl 20 Mg Tabs (Benazepril hcl) .Marland Kitchen.. 1 by mouth once daily 3)  Victoza 18 Mg/71ml Soln (Liraglutide) .... 1.8 mg sq once daily 4)  One Touch Ultra Mini Strips  .... Accu check two times a day 5)  Nexium 40 Mg Cpdr  (Esomeprazole magnesium) .Marland Kitchen.. 1 by mouth once daily 6)  Onetouch Ultra System W/device Kit (Blood glucose monitoring suppl) .... Use as directed 7)  Lipitor 40 Mg Tabs (Atorvastatin calcium) .Marland Kitchen.. 1 by mouth at bedtime. 8)  Novofine 32g X 6 Mm Misc (Insulin pen needle) .... As directed with victoza 9)  Flexeril 5 Mg Tabs (Cyclobenzaprine hcl) .Marland Kitchen.. 1 by mouth three times a day as needed  Patient Instructions: 1)  Most patients (90%) with low back pain will improve with time ( 2-6 weeks). Keep active but avoid activities that are painful. Apply moist heat and/or ice to lower back several times a day.  Prescriptions: FLEXERIL 5 MG TABS (CYCLOBENZAPRINE HCL) 1 by mouth three times a day as needed  #30 x 0   Entered and Authorized by:   Loreen Freud DO   Signed by:   Loreen Freud DO on 05/30/2010   Method used:   [facility] to        [facility]  [address] [phone]* (retail)  [address]       [place], [place]  [address]       Ph: [phone] or [phone]       Fax: [phone]   RxID:   [id number]    Orders Added: 1)  Est. Patient Level III [95638]

## 2010-07-19 ENCOUNTER — Other Ambulatory Visit: Payer: Self-pay | Admitting: Family Medicine

## 2010-07-19 ENCOUNTER — Encounter: Payer: Self-pay | Admitting: Family Medicine

## 2010-07-19 ENCOUNTER — Other Ambulatory Visit: Payer: Self-pay

## 2010-07-19 ENCOUNTER — Ambulatory Visit (INDEPENDENT_AMBULATORY_CARE_PROVIDER_SITE_OTHER): Payer: BC Managed Care – PPO | Admitting: Family Medicine

## 2010-07-19 DIAGNOSIS — M65849 Other synovitis and tenosynovitis, unspecified hand: Secondary | ICD-10-CM

## 2010-07-19 DIAGNOSIS — E785 Hyperlipidemia, unspecified: Secondary | ICD-10-CM

## 2010-07-19 DIAGNOSIS — M65839 Other synovitis and tenosynovitis, unspecified forearm: Secondary | ICD-10-CM | POA: Insufficient documentation

## 2010-07-19 DIAGNOSIS — E119 Type 2 diabetes mellitus without complications: Secondary | ICD-10-CM

## 2010-07-19 DIAGNOSIS — I10 Essential (primary) hypertension: Secondary | ICD-10-CM

## 2010-07-19 DIAGNOSIS — E538 Deficiency of other specified B group vitamins: Secondary | ICD-10-CM

## 2010-07-19 DIAGNOSIS — Z0000 Encounter for general adult medical examination without abnormal findings: Secondary | ICD-10-CM

## 2010-07-19 LAB — HEPATIC FUNCTION PANEL
ALT: 21 U/L (ref 0–35)
AST: 22 U/L (ref 0–37)
Albumin: 3.9 g/dL (ref 3.5–5.2)
Alkaline Phosphatase: 71 U/L (ref 39–117)
Total Protein: 7 g/dL (ref 6.0–8.3)

## 2010-07-19 LAB — CBC WITH DIFFERENTIAL/PLATELET
Basophils Absolute: 0 10*3/uL (ref 0.0–0.1)
Basophils Relative: 0.9 % (ref 0.0–3.0)
HCT: 37.5 % (ref 36.0–46.0)
Hemoglobin: 12.9 g/dL (ref 12.0–15.0)
Lymphs Abs: 1.4 10*3/uL (ref 0.7–4.0)
MCHC: 34.3 g/dL (ref 30.0–36.0)
Monocytes Relative: 7.5 % (ref 3.0–12.0)
Neutro Abs: 1.4 10*3/uL (ref 1.4–7.7)
RBC: 4.28 Mil/uL (ref 3.87–5.11)
RDW: 14.4 % (ref 11.5–14.6)

## 2010-07-19 LAB — BASIC METABOLIC PANEL
CO2: 28 mEq/L (ref 19–32)
GFR: 87.67 mL/min (ref >60.00)
Glucose, Bld: 101 mg/dL — ABNORMAL HIGH (ref 70–99)
Potassium: 4.1 mEq/L (ref 3.5–5.1)
Sodium: 137 mEq/L (ref 135–145)

## 2010-07-19 LAB — HEMOGLOBIN A1C: Hgb A1c MFr Bld: 6.4 % (ref 4.6–6.5)

## 2010-07-19 LAB — LIPID PANEL: Total CHOL/HDL Ratio: 2

## 2010-07-19 LAB — MICROALBUMIN / CREATININE URINE RATIO: Microalb Creat Ratio: 1.9 mg/g (ref 0.0–30.0)

## 2010-07-23 NOTE — Assessment & Plan Note (Signed)
Summary: Rt hand throbbing, loosing grip; then lab = LOWNE-hgba1c,bmp,...   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 Pt also needs labs done.  Blood sugars are much better with Victoza.  No other complaints.    Current Medications (verified): 1)  Triamterene-Hctz 75-50 Mg  Tabs (Triamterene-Hctz) .... Take One Tablet Daily 2)  Benazepril Hcl 20 Mg Tabs (Benazepril Hcl) .Marland Kitchen.. 1 By Mouth Once Daily 3)  Victoza 18 Mg/83ml Soln (Liraglutide) .... 1.8 Mg Sq Once Daily 4)  One Touch Ultra Mini Strips .... Accu Check Two Times A Day 5)  Nexium 40 Mg Cpdr (Esomeprazole Magnesium) .Marland Kitchen.. 1 By Mouth Once Daily 6)  Onetouch Ultra System W/device Kit (Blood Glucose Monitoring Suppl) .... Use As Directed 7)  Lipitor 40 Mg Tabs (Atorvastatin Calcium) .Marland Kitchen.. 1 By Mouth At Bedtime. 8)  Novofine 32g X 6 Mm Misc (Insulin Pen Needle) .... As Directed With Victoza 9)  Flexeril 5 Mg Tabs (Cyclobenzaprine Hcl) .Marland Kitchen.. 1 By Mouth Three Times A Day As Needed  Allergies (verified): No Known Drug Allergies  Physical Exam  General:  Well-developed,well-nourished,in no acute distress; alert,appropriate and cooperative throughout examination Lungs:  Normal respiratory effort, chest expands symmetrically. Lungs are clear to auscultation, no crackles or wheezes. Heart:  Normal rate and regular rhythm. S1 and S2 normal without gallop, murmur, click, rub or other extra sounds. Msk:  R hand---tenderness 1st pip  no swelling no errythema Extremities:  No clubbing, cyanosis, edema, or deformity noted with normal full range of motion of all  joints.   Psych:  Oriented X3 and normally interactive.     Impression & Recommendations:  Problem # 1:  OTHER TENOSYNOVITIS OF HAND AND WRIST (ICD-727.05) get splint from pharmacy temporary naprosyn for pain inflammation  Problem # 2:  HYPERLIPIDEMIA (ICD-272.4)  Her updated medication list for this problem includes:    Lipitor 40 Mg Tabs (Atorvastatin calcium) .Marland Kitchen... 1 by mouth at bedtime.  Orders: Venipuncture (16109) TLB-Lipid Panel (80061-LIPID) TLB-BMP (Basic Metabolic Panel-BMET) (80048-METABOL) TLB-CBC Platelet - w/Differential (85025-CBCD) TLB-Hepatic/Liver Function Pnl (80076-HEPATIC) TLB-A1C / Hgb A1C (Glycohemoglobin) (83036-A1C) TLB-Microalbumin/Creat Ratio, Urine (82043-MALB)  Problem # 3:  HYPERTENSION (ICD-401.9)  Her updated medication list for this problem includes:    Triamterene-hctz 75-50 Mg Tabs (Triamterene-hctz) .Marland Kitchen... Take one tablet daily    Benazepril Hcl 20 Mg Tabs (Benazepril hcl) .Marland Kitchen... 1 by mouth once daily  Orders: Venipuncture (60454) TLB-Lipid Panel (80061-LIPID) TLB-BMP (Basic Metabolic Panel-BMET) (80048-METABOL) TLB-CBC Platelet - w/Differential (85025-CBCD) TLB-Hepatic/Liver Function Pnl (80076-HEPATIC) TLB-A1C / Hgb A1C (Glycohemoglobin) (83036-A1C) TLB-Microalbumin/Creat Ratio, Urine (82043-MALB)  BP today: 140/90 Prior BP: 142/86 (05/30/2010)  Prior 10 Yr Risk Heart Disease: 7 % (11/09/2007)  Labs Reviewed: K+: 4.1 (01/04/2010) Creat: : 0.9 (01/04/2010)   Chol: 113 (01/04/2010)   HDL: 46.80 (01/04/2010)   LDL: 61 (01/04/2010)   TG: 28.0 (01/04/2010)  Problem # 4:  DIABETES MELLITUS, TYPE II (ICD-250.00)  Her updated medication list for this problem includes:    Benazepril Hcl 20 Mg Tabs (Benazepril  hcl) ..... 1 by mouth once daily    Victoza 18 Mg/21ml Soln (Liraglutide) .Marland Kitchen... 1.8 mg sq once daily  Orders: Venipuncture (03474) TLB-Lipid Panel (80061-LIPID) TLB-BMP (Basic Metabolic Panel-BMET) (80048-METABOL) TLB-CBC  Platelet - w/Differential (85025-CBCD) TLB-Hepatic/Liver Function Pnl (80076-HEPATIC) TLB-A1C / Hgb A1C (Glycohemoglobin) (83036-A1C) TLB-Microalbumin/Creat Ratio, Urine (82043-MALB)  Labs Reviewed: Creat: 0.9 (01/04/2010)    Reviewed HgBA1c results: 6.2 (01/04/2010)  6.6 (10/17/2009)  Complete Medication List: 1)  Triamterene-hctz 75-50 Mg Tabs (Triamterene-hctz) .... Take one tablet daily 2)  Benazepril Hcl 20 Mg Tabs (Benazepril hcl) .Marland Kitchen.. 1 by mouth once daily 3)  Victoza 18 Mg/78ml Soln (Liraglutide) .... 1.8 mg sq once daily 4)  One Touch Ultra Mini Strips  .... Accu check two times a day 5)  Nexium 40 Mg Cpdr (Esomeprazole magnesium) .Marland Kitchen.. 1 by mouth once daily 6)  Onetouch Ultra System W/device Kit (Blood glucose monitoring suppl) .... Use as directed 7)  Lipitor 40 Mg Tabs (Atorvastatin calcium) .Marland Kitchen.. 1 by mouth at bedtime. 8)  Novofine 32g X 6 Mm Misc (Insulin pen needle) .... As directed with victoza 9)  Flexeril 5 Mg Tabs (Cyclobenzaprine hcl) .Marland Kitchen.. 1 by mouth three times a day as needed 10)  Naprosyn 500 Mg Tabs (Naproxen) .Marland Kitchen.. 1 by mouth two times a day  Patient Instructions: 1)  schedule cpe  Prescriptions: NAPROSYN 500 MG TABS (NAPROXEN) 1 by mouth two times a day  #60 x 0   Entered and Authorized by:   Loreen Freud DO   Signed by:   Loreen Freud DO on 07/19/2010   Method used:   [facility]        [facility] [phone]* (retail)       [address]       [place], [place]  [address]       Ph: [phone] or [phone]       Fax: [phone]   RxID:   [id number] VICTOZA 18 MG/3ML SOLN (LIRAGLUTIDE) 1.8 mg SQ once daily  #3 months x 3   Entered and Authorized by:   Loreen Freud DO   Signed by:   Loreen Freud DO on 07/19/2010   Method used:   [facility]        [facility] [phone]* (retail)       [address]       [place], [place]  [address]       Ph: [phone] or [phone]       Fax:  [phone]   RxID:   [id number]    Orders Added: 1)  Venipuncture [10258] 2)  TLB-Lipid Panel [80061-LIPID] 3)  TLB-BMP (Basic Metabolic Panel-BMET) [80048-METABOL] 4)  TLB-CBC Platelet - w/Differential [85025-CBCD] 5)  TLB-Hepatic/Liver Function Pnl [80076-HEPATIC] 6)  TLB-A1C / Hgb A1C (Glycohemoglobin) [83036-A1C] 7)  TLB-Microalbumin/Creat Ratio, Urine [82043-MALB] 8)  Est. Patient Level IV [52778]  Appended Document: Rt hand throbbing, loosing grip; then lab = LOWNE-hgba1c,bmp,...  Laboratory Results   Urine Tests   Date/Time Reported: July 19, 2010 10:28 AM   Routine Urinalysis   Color: yellow Appearance: Clear Glucose: negative   (Normal Range: Negative) Bilirubin: negative   (Normal Range: Negative) Ketone: negative   (Normal Range: Negative) Spec. Gravity: 1.010   (Normal Range: 1.003-1.035) Blood: negative   (Normal Range: Negative) pH: 6.0   (Normal Range: 5.0-8.0) Protein: negative   (Normal Range: Negative) Urobilinogen: negative   (Normal  Range: 0-1) Nitrite: negative   (Normal Range: Negative) Leukocyte Esterace: negative   (Normal Range: Negative)    Comments: Floydene Flock  July 19, 2010 10:28 AM

## 2010-08-14 ENCOUNTER — Encounter: Payer: Self-pay | Admitting: Family Medicine

## 2010-08-22 ENCOUNTER — Encounter: Payer: Self-pay | Admitting: Family Medicine

## 2010-08-22 ENCOUNTER — Ambulatory Visit (INDEPENDENT_AMBULATORY_CARE_PROVIDER_SITE_OTHER): Payer: BC Managed Care – PPO | Admitting: Family Medicine

## 2010-08-22 VITALS — BP 114/64 | HR 84 | Wt 336.4 lb

## 2010-08-22 DIAGNOSIS — E785 Hyperlipidemia, unspecified: Secondary | ICD-10-CM

## 2010-08-22 DIAGNOSIS — E538 Deficiency of other specified B group vitamins: Secondary | ICD-10-CM

## 2010-08-22 DIAGNOSIS — M549 Dorsalgia, unspecified: Secondary | ICD-10-CM

## 2010-08-22 DIAGNOSIS — E669 Obesity, unspecified: Secondary | ICD-10-CM

## 2010-08-22 DIAGNOSIS — Z0000 Encounter for general adult medical examination without abnormal findings: Secondary | ICD-10-CM

## 2010-08-22 DIAGNOSIS — I10 Essential (primary) hypertension: Secondary | ICD-10-CM

## 2010-08-22 MED ORDER — ESOMEPRAZOLE MAGNESIUM 40 MG PO CPDR: 40.0000 mg | DELAYED_RELEASE_CAPSULE | Freq: Every day | ORAL | Status: DC

## 2010-08-22 MED ORDER — LIRAGLUTIDE 18 MG/3ML ~~LOC~~ SOLN: 1.8000 mg | Freq: Every day | SUBCUTANEOUS | Status: DC

## 2010-08-22 MED ORDER — CYCLOBENZAPRINE HCL 5 MG PO TABS: 5.0000 mg | ORAL_TABLET | Freq: Three times a day (TID) | ORAL | Status: DC | PRN

## 2010-08-22 MED ORDER — GLUCOSE BLOOD VI STRP: ORAL_STRIP | Status: DC

## 2010-08-22 NOTE — Assessment & Plan Note (Signed)
con't meds Reviewed labs with pt

## 2010-08-22 NOTE — Patient Instructions (Signed)
[facility]---Dr Saul Fordyce Obesity Obesity is defined as having a Body Mass Index (BMI) of 30 or more. To calculate your BMI divide your weight in pounds by your height in inches squared and multiply that product by 703. Major illnesses resulting from long-term obesity include:  Stroke.   Heart disease.   Diabetes.   Many cancers.   Arthritis.  Obesity also complicates recovery from many other medical problems.  CAUSES  A history of obesity in your parents.   Thyroid hormone imbalance.   Environmental factors such as excess calorie intake and physical inactivity.  TREATMENT A healthy weight loss program includes:  A calorie restricted diet based on individual calorie needs.   Increased physical activity (exercise).  An exercise program is just as important as the right low calorie diet.  Weight-loss medicines should be used only under the supervision of your physician. These medicines help, but only if they are used with diet and exercise programs. Medicines can have side effects including nervousness, nausea, abdominal pain, diarrhea, headache, drowsiness, and depression.  An unhealthy weight loss program includes:  Fasting.   Fad diets.   Supplements and drugs.  These choices do not succeed in long-term weight control.  HOME CARE INSTRUCTIONS To help you make the needed dietary changes:   Keep a daily record of everything you eat. There are many free websites to help you with this. It may be helpful to measure your foods so you can determine if you are eating the correct portion sizes.   Use low-calorie cookbooks or take special cooking classes.   Avoid alcohol. Drink more water and drinks with no calories.   Take vitamins and supplements only as recommended by your caregiver.   Weight-loss support groups, Optometrist, counselors, and stress reduction education can also be very helpful.  PREVENTION Losing weight and keeping it off takes  time, discipline, a healthy diet and regular exercise. Document Released: 05/29/2004 Document Re-Released: 05/11/2007 [facility] Patient Information 2011 [place], [place].

## 2010-08-22 NOTE — Assessment & Plan Note (Signed)
Check level 

## 2010-08-22 NOTE — Progress Notes (Signed)
  Subjective:     Tammy [name] is a 50 y.o. female and is here for a comprehensive physical exam. The patient reports no problems.  History   Social History  . Marital Status: Married    Spouse Name: N/A    Number of Children: 0  . 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 Smoking status: Never Smoker   . Smokeless tobacco: Never Used  . Alcohol Use: No  . Drug Use: No  . Sexually Active: Yes -- Female partner(s)   Other Topics Concern  . Not on file   Social History Narrative  . No narrative on file   Health Maintenance  Topic Date Due  . Pap Smear  02/03/1979  . Tetanus/tdap  07/19/2017    The following portions of the patient's history were reviewed and updated as appropriate: allergies, current medications, past family history, past medical history, past social history, past surgical history and problem list.  Review of Systems  Review of Systems  Constitutional: Negative for activity change, appetite change and fatigue.  HENT: Negative for hearing loss, congestion, tinnitus and ear discharge.   Eyes: Negative for visual disturbance (see optho q1y -- vision corrected to 20/20 with glasses).  Respiratory: Negative for cough, chest tightness and shortness of breath.   Cardiovascular: Negative for chest pain, palpitations and leg swelling.  Gastrointestinal: Negative for abdominal pain, diarrhea, constipation and abdominal distention.  Genitourinary: Negative for urgency, frequency, decreased urine volume and difficulty urinating.  Musculoskeletal: Negative for back pain, arthralgias and gait problem. + feet pain Skin: Negative for color change, pallor and rash.  Neurological: Negative for dizziness, light-headedness, numbness and headaches.  Hematological: Negative for adenopathy. Does not bruise/bleed easily.  Psychiatric/Behavioral: Negative for suicidal ideas, confusion, sleep  disturbance, self-injury, dysphoric mood, decreased concentration and agitation.  Pt is able to read and write and can do all ADLs No risk for falling No abuse/ violence in home   Objective:    BP 114/64  Pulse 84  Wt 336 lb 6.4 oz (152.59 kg) General appearance: alert, cooperative, appears stated age, no distress and morbidly obese Head: Normocephalic, without obvious abnormality, atraumatic Eyes: conjunctivae/corneas clear. PERRL, EOM's intact. Fundi benign. Ears: normal TM's and external ear canals both ears Nose: Nares normal. Septum midline. Mucosa normal. No drainage or sinus tenderness. Throat: lips, mucosa, and tongue normal; teeth and gums normal Neck: no adenopathy, no carotid bruit, no JVD, supple, symmetrical, trachea midline and thyroid not enlarged, symmetric, no tenderness/mass/nodules Lungs: clear to auscultation bilaterally Breasts: gyn Heart: regular rate and rhythm, S1, S2 normal, no murmur, click, rub or gallop Abdomen: soft, non-tender; bowel sounds normal; no masses,  no organomegaly Pelvic: gyn Extremities: + pitting edema b/l , no cyanosis Pulses: 2+ and symmetric Skin: Skin color, texture, turgor normal. No rashes or lesions Lymph nodes: Cervical, supraclavicular, and axillary nodes normal. Neurologic: Grossly normal   Sensory exam of the foot is normal, tested with the monofilament. Good pulses, no lesions or ulcers, good peripheral pulses.  Assessment:    Healthy female exam.     Plan:     See After Visit Summary for Counseling Recommendations

## 2010-08-22 NOTE — Assessment & Plan Note (Signed)
Stable con't meds 

## 2010-08-22 NOTE — Assessment & Plan Note (Signed)
Diet  And exercise discussed

## 2010-08-26 ENCOUNTER — Other Ambulatory Visit (INDEPENDENT_AMBULATORY_CARE_PROVIDER_SITE_OTHER): Payer: BC Managed Care – PPO

## 2010-08-26 DIAGNOSIS — E119 Type 2 diabetes mellitus without complications: Secondary | ICD-10-CM

## 2010-08-27 NOTE — Progress Notes (Signed)
Was this done?

## 2010-08-28 ENCOUNTER — Telehealth: Payer: Self-pay

## 2010-08-28 NOTE — Telephone Encounter (Signed)
Left message to call on voicemail      KP

## 2010-08-28 NOTE — Telephone Encounter (Signed)
Message copied by Almeta Monas on Wed Aug 28, 2010  3:24 PM ------      Message from: Loreen Freud      Created: Tue Aug 27, 2010  8:24 AM       Low vita D---take otc vita D3 2000u daily and rx vita D 50000 u weekly  #4  2 refills and recheck 3 months

## 2010-08-30 NOTE — Telephone Encounter (Signed)
Left message to call back     KP 

## 2010-09-04 ENCOUNTER — Telehealth: Payer: Self-pay

## 2010-09-04 NOTE — Telephone Encounter (Signed)
Left message to call back to review Labs------Low vita D---take otc vita D3 2000u daily and rx vita D 50000 u weekly #4 2 refills and recheck 3 months    KP

## 2010-09-06 MED ORDER — ERGOCALCIFEROL 1.25 MG (50000 UT) PO CAPS: 50000.0000 [IU] | ORAL_CAPSULE | ORAL | Status: DC

## 2010-09-06 NOTE — Telephone Encounter (Signed)
Spoke with patient and made her aware of results---she voiced understanding and will call back to reschedule the appt because she was driving...    KP

## 2010-09-20 NOTE — Assessment & Plan Note (Signed)
[facility]                           GASTROENTEROLOGY OFFICE NOTE   NAME:[name], [name]                    MRN:          [id number]  DATE:03/30/2006                            [name]:          1961/01/17    CHIEF COMPLAINT:  Vomiting, abdominal pain.   REQUESTING PHYSICIAN:  Leanne Chang, MD   ASSESSMENT:  A 50 year old African-American woman with episodic, persistent  vomiting over the last 6 months.  It is associated with periumbilical cramps  and pain.  It has not responded to PPI therapy.  Her LFTs, amylase, lipase,  CBC, abdominal ultrasound, and Helicobacter serologies are negative.  It  could be gastric outlet obstruction, small bowel obstruction; there is some  association with stress, so perhaps severe irritable bowel phenomenon, or  some type of motility disturbance.  Abdominal migraine is possible, I  suppose.   PLAN:  1. Start with an upper gastrointestinal endoscopy.  2. Depending upon what that shows, gastric emptying study, small-bowel      follow-through, or CT scanning could be indicated versus other      laboratory work-up with something like 5-HIAA levels, and she has      chills and sweats before it starts, though she does not have diarrhea      making that less likely.  Other more rare causes such porphyria could      be considered as well.  3. Empiric therapy for irritable bowel phenomenon could be considered as      well.   HISTORY:  A 50 -year-old black woman who about 6 months ago, started having  spells of projectile vomiting preceded by chills, periumbilical cramps, and  sweats.  She does not describe a rash.  Her bowel movements have been  regular.  She had been on a proton pump inhibitor for years for acid reflux  with good control.  She was on Aciphex and then sometime this year switched  to Nexium daily and is now on Nexium twice daily.  She has tried eliminating  different foods, and cannot really identify a  particular trigger.  She does  not eat beef anymore because she feels like that does not digest properly.  She is generally well in between, though her abdomen can be sore.  The  spells last for hours and are uncontrollable.  Laboratory and x-ray  evaluation as described above.   MEDICATIONS:  1. Nexium 40 mg b.i.d.  2. Lotrel 20/40 mg daily.  3. Maxzide 37.5 mg daily.   DRUG ALLERGIES:  None known.   AS NEEDED MEDICATIONS:  Gas-X   PAST MEDICAL HISTORY:  1. Hypertension.  2. Obesity.  3. Allergies.  4. Hysterectomy.   FAMILY HISTORY:  Father had diabetes.  No colon cancer.   SOCIAL HISTORY:  She is married.  She is a Careers information officer at [employer].  No alcohol, tobacco, or drugs.   REVIEW OF SYSTEMS:  As described above.  She has some cough problems and  back pain.  All other systems are negative.   PHYSICAL EXAMINATION:  GENERAL:  A well-developed, well-nourished obese  black  woman in no acute distress.  VITAL SIGNS:  Weight 315 pounds, height 5 feet 6 inches, blood pressure  110/72, pulse 68.  The eyes are anicteric.  ENT:  Normal mouth, posterior pharynx.  NECK:  Supple, no thyromegaly or mass.  CHEST:  Clear.  HEART:  S1, S2.  I hear no murmurs, rubs, or gallops.  ABDOMEN:  Obese.  She has a lower midline scar.  There is no hernia in the  abdomen or the inguinal region.  She is mildly tender below the umbilicus,  worse with muscle tension.  EXTREMITIES:  No peripheral edema.  SKIN:  Warm and dry without rash.  NEURO/PSYCH:  She is alert and oriented x3.  LYMPH NODES:  No neck, subclavicular, or groin adenopathy palpated.   I appreciate the opportunity to care for this patient.     Iva Boop, MD,FACG  Electronically Signed    CEG/MedQ  DD: 03/30/2006  DT: 03/30/2006  Job #: [id number]   cc:   Leanne Chang, M.D.

## 2010-09-20 NOTE — Assessment & Plan Note (Signed)
[facility]                         GASTROENTEROLOGY OFFICE NOTE   NAME:Tammy [name], Tammy [name]                    MRN:          [id number]  DATE:05/07/2006                            [name]:          1961/01/02    CHIEF COMPLAINT:  Recurrent vomiting and abdominal pain, reaction to  Levbid.   I had placed her on Levbid after her small bowel series was normal.  I  had written that to be 2 times a day, but she was taken 2 a day at  bedtime.  On Sunday, several days ago, she took 2 at a time during the  day, and about an hour later she felt nauseous, sweaty and sleepy, and  probably had a brief loss of consciousness.  She has not taken it since.  She has not have any severe episodic vomiting or problems since I have  endoscoped.  Bowel habits are regular.  She is starting to gain weight  again.  She feels like her hands are puffy and her right lower  extremity, which has been operated on before with a knot removed is  swelling more.   Medications are listed and reviewed in the chart.   PHYSICAL EXAMINATION:  Weight 329 pounds, which is up from 315.  Pulse  80.  Blood pressure 132/76.  ABDOMEN:  Obese.  EXTREMITIES:  She has trace edema in the left lower extremity.  There is  1 to 2 plus edema with brawny induration in the right lower extremity  without warmth.  There is no cord, though it is difficult due to the  size.  She does say she has had chronic edema here.  There is a scar in  the upper calf area from previous surgery.   ASSESSMENT:  I think most likely she has irritable bowel phenomenon.  Workup so far has been unrevealing.  Unfortunately, either the  instructions on her Levbid were wrong or she was taking that  incorrectly.  I think she had some sort of a reaction to the double dose  of that.   PLAN:  We will dicyclomine as needed.  Then, I will see her back in 2  months.  Should she go on to develop problems again, I will then  consider other  investigation such as CT scanning.  At this point,  however, she does seem to be improved.  She is to talk to Dr. Blossom Hoops  about her lower extremity edema problems.     Iva Boop, MD,FACG  Electronically Signed    CEG/MedQ  DD: 05/07/2006  DT: 05/07/2006  Job #: [id number]   cc:   Leanne Chang, M.D.

## 2010-09-20 NOTE — Assessment & Plan Note (Signed)
[facility]                                 ON-CALL NOTE   NAME:[name], Tammy                      MRN:          [id number]  DATE:07/29/2007                            [name]:          1960-08-20    This is an outpatient on call note.  Date of birth is [birth year].  Date of interaction July 29, 2007 at 6:38 p.m.  Phone number is [phone].   OBJECTIVE:  The patient has pain in her back, chest, and head.  She  started with back pain yesterday, a headache later in the day, and then  started hurting in her chest with turning her head from side-to-side.  The worst pain is in her back.  She has tried Gas-X and Nexium cause she  thought it was gas and has not made any difference.  She tolerates Aleve  okay and I told her it could be costochondritis or probable muscle  inflammation and would try Aleve 2 tablets after supper and after  breakfast and place heat on the worst place which she said was her back.  Call tomorrow if no improvement to be seen.   PRIMARY CARE [name]:  Dr. Laury Axon.   HOME OFFICE:  [name].     Arta Silence, MD  Electronically Signed    RNS/MedQ  DD: 07/29/2007  DT: 07/29/2007  Job #: [id number]

## 2010-10-16 ENCOUNTER — Other Ambulatory Visit: Payer: Self-pay | Admitting: Family Medicine

## 2010-10-16 DIAGNOSIS — E569 Vitamin deficiency, unspecified: Secondary | ICD-10-CM

## 2010-10-17 ENCOUNTER — Other Ambulatory Visit (INDEPENDENT_AMBULATORY_CARE_PROVIDER_SITE_OTHER): Payer: BC Managed Care – PPO

## 2010-10-17 DIAGNOSIS — E569 Vitamin deficiency, unspecified: Secondary | ICD-10-CM

## 2010-10-17 NOTE — Progress Notes (Signed)
Labs only

## 2010-10-21 ENCOUNTER — Encounter: Payer: Self-pay | Admitting: *Deleted

## 2010-10-22 ENCOUNTER — Other Ambulatory Visit: Payer: Self-pay

## 2010-10-22 MED ORDER — ERGOCALCIFEROL 1.25 MG (50000 UT) PO CAPS: 50000.0000 [IU] | ORAL_CAPSULE | ORAL | Status: AC

## 2010-10-22 MED ORDER — LORATADINE 10 MG PO TABS: 10.0000 mg | ORAL_TABLET | Freq: Every day | ORAL | Status: DC

## 2010-11-11 ENCOUNTER — Other Ambulatory Visit: Payer: Self-pay | Admitting: Family Medicine

## 2010-11-11 MED ORDER — ATORVASTATIN CALCIUM 40 MG PO TABS: 40.0000 mg | ORAL_TABLET | Freq: Every day | ORAL | Status: DC

## 2010-11-11 NOTE — Telephone Encounter (Signed)
Rx faxed.    KP 

## 2010-11-14 ENCOUNTER — Ambulatory Visit (INDEPENDENT_AMBULATORY_CARE_PROVIDER_SITE_OTHER): Payer: BC Managed Care – PPO | Admitting: Family Medicine

## 2010-11-14 ENCOUNTER — Encounter: Payer: Self-pay | Admitting: Family Medicine

## 2010-11-14 VITALS — BP 116/80 | HR 74 | Temp 98.8°F | Wt 341.2 lb

## 2010-11-14 DIAGNOSIS — H9202 Otalgia, left ear: Secondary | ICD-10-CM

## 2010-11-14 DIAGNOSIS — H9209 Otalgia, unspecified ear: Secondary | ICD-10-CM | POA: Insufficient documentation

## 2010-11-14 NOTE — Assessment & Plan Note (Signed)
Exam normal ? TMJ--pt has appointment with Dentist next week She is requesting E"NT referral

## 2010-11-14 NOTE — Patient Instructions (Signed)
Otalgia (Earache)  Otalgia is pain in or around the ear. When the pain is from the ear itself it is called primary otalgia. Pain may also be coming from somewhere else, like the head and neck. This is called secondary otalgia.    CAUSES  Causes of primary otalgia include:   Middle ear infection.    It can also be caused by injury to the ear or infection of the ear canal (swimmer's ear). Swimmer's ear causes pain, swelling and often drainage from the ear canal.   Causes of secondary otalgia include:   Sinus infections.    Allergies and colds that cause stuffiness of the nose and tubes that drain the ears (eustachian tubes).    Dental problems like cavities, gum infections or teething.    Sore Throat (tonsillitis and pharyngitis).    Swollen glands in the neck.    Infection of the bone behind the ear (mastoiditis).    TMJ discomfort (problems with the joint between your jaw and your skull).    Other problems such as nerve disorders, circulation problems, heart disease and tumors of the head and neck can also cause symptoms of ear pain. This is rare.   DIAGNOSIS  Evaluation, Diagnosis and Testing:   Examination by your medical caregiver is recommended to evaluate and diagnose the cause of otalgia.    Further testing or referral to a specialist may be indicated if the cause of the ear pain is not found and the symptom persists.   TREATMENT   Your doctor may prescribe antibiotics if an ear infection is diagnosed.    Pain relievers and topical analgesics may be recommended.    It is important to take all medications as prescribed.   HOME CARE INSTRUCTIONS   It may be helpful to sleep with the painful ear in the up position.    A warm compress over the painful ear may provide relief.    A soft diet and avoiding gum may help while ear pain is present.   SEEK IMMEDIATE MEDICAL CARE IF YOU DEVELOP:   Severe pain, a high fever, repeated vomiting or dehydration.    Extreme dizziness, headache, confusion,  ringing in the ears (tinnitus) or hearing loss.   Document Released: 05/29/2004 Document Re-Released: 04/09/2009  [employer] Patient Information 2011 [employer].

## 2010-11-14 NOTE — Progress Notes (Signed)
  Subjective:    Patient ID: Tammy [name], female    [name]: Mar 03, 1961, 50 y.o.   MRN: [id number]  HPI Pt here c/o L ear pain for 2 weeks---pain is sharp and she gets a little dizzy.   Review of Systems As above---  ? TMJ, grinding teeth at night.    Objective:   Physical Exam  Constitutional: She appears well-developed and well-nourished.  HENT:  Head: Normocephalic and atraumatic.  Right Ear: External ear normal.  Left Ear: External ear normal.  Nose: Nose normal.  Mouth/Throat: Oropharynx is clear and moist. No oropharyngeal exudate.       + ? Click L TMJ  Neck: Normal range of motion. Neck supple.          Assessment & Plan:

## 2010-11-18 ENCOUNTER — Other Ambulatory Visit: Payer: Self-pay | Admitting: Family Medicine

## 2010-11-18 DIAGNOSIS — Z1231 Encounter for screening mammogram for malignant neoplasm of breast: Secondary | ICD-10-CM

## 2010-11-21 ENCOUNTER — Other Ambulatory Visit: Payer: Self-pay | Admitting: Family Medicine

## 2010-11-21 MED ORDER — BENAZEPRIL HCL 20 MG PO TABS: 20.0000 mg | ORAL_TABLET | Freq: Every day | ORAL | Status: DC

## 2010-11-21 NOTE — Telephone Encounter (Signed)
Addended by: Doristine Devoid on: 11/21/2010 12:17 PM   Modules accepted: Orders

## 2010-11-21 NOTE — Telephone Encounter (Signed)
Done

## 2010-11-25 ENCOUNTER — Other Ambulatory Visit: Payer: Self-pay | Admitting: Family Medicine

## 2010-11-25 MED ORDER — TRIAMTERENE-HCTZ 75-50 MG PO TABS: 1.0000 | ORAL_TABLET | Freq: Every day | ORAL | Status: DC

## 2010-11-25 NOTE — Telephone Encounter (Signed)
Rx faxed.    KP 

## 2010-12-02 ENCOUNTER — Telehealth: Payer: Self-pay | Admitting: *Deleted

## 2010-12-02 NOTE — Telephone Encounter (Signed)
Pt left VM stating that leg and feet swelling is still not improving.Left message to call office .

## 2010-12-03 NOTE — Telephone Encounter (Signed)
Left message to call office

## 2010-12-04 NOTE — Telephone Encounter (Signed)
Left message to call office

## 2010-12-06 ENCOUNTER — Encounter: Payer: Self-pay | Admitting: *Deleted

## 2010-12-06 NOTE — Telephone Encounter (Signed)
Left message to call office, Letter Mail due to unable to contact Pt after several attempts.

## 2010-12-11 ENCOUNTER — Ambulatory Visit
Admission: RE | Admit: 2010-12-11 | Discharge: 2010-12-11 | Disposition: A | Discharge status: Home / Self Care | Payer: BC Managed Care – PPO | Source: Ambulatory Visit | Attending: Family Medicine | Admitting: Family Medicine

## 2010-12-11 DIAGNOSIS — Z1231 Encounter for screening mammogram for malignant neoplasm of breast: Secondary | ICD-10-CM

## 2011-02-06 ENCOUNTER — Encounter: Payer: Self-pay | Admitting: Family Medicine

## 2011-02-06 ENCOUNTER — Ambulatory Visit (INDEPENDENT_AMBULATORY_CARE_PROVIDER_SITE_OTHER): Payer: BC Managed Care – PPO | Admitting: Family Medicine

## 2011-02-06 VITALS — BP 134/82 | HR 72 | Temp 98.6°F | Wt 345.2 lb

## 2011-02-06 DIAGNOSIS — R609 Edema, unspecified: Secondary | ICD-10-CM

## 2011-02-06 DIAGNOSIS — Z0000 Encounter for general adult medical examination without abnormal findings: Secondary | ICD-10-CM

## 2011-02-06 DIAGNOSIS — R110 Nausea: Secondary | ICD-10-CM

## 2011-02-06 MED ORDER — PROMETHAZINE HCL 25 MG RE SUPP: 25.0000 mg | Freq: Four times a day (QID) | RECTAL | Status: DC | PRN

## 2011-02-06 MED ORDER — FUROSEMIDE 40 MG PO TABS: 40.0000 mg | ORAL_TABLET | Freq: Every day | ORAL | Status: DC

## 2011-02-06 NOTE — Patient Instructions (Signed)
Edema Edema is an abnormal build-up of fluids in tissues. Because this is partly dependent on gravity (water flows to the lowest place), it is more common in the lower extremities (legs and thighs). It is also common in the looser tissues, like around the eyes. Painless swelling of the feet and ankles is common and increases as a person ages. It may affect both legs and may include the calves or even thighs. When squeezed, the fluid may move out of the affected area and may leave a dent for a few moments. CAUSES  Prolonged standing or sitting in one place for extended periods of time. Movement helps pump tissue fluid into the veins, and absence of movement prevents this, resulting in edema.   Varicose veins. The valves in the veins do not work as well as they should. This causes fluid to leak into the tissues.   Fluid and salt overload.   Injury, burn, or surgery to the leg, ankle, or foot, may damage veins and allow fluid to leak out.   Sunburn damages vessels. Leaky vessels allow fluid to go out into the sunburned tissues.   Allergies (from insect bites or stings, medications or chemicals) cause swelling by allowing vessels to become leaky.   Protein in the blood helps keep fluid in your vessels. Low protein, as in malnutrition, allows fluid to leak out.   Hormonal changes, including pregnancy and menstruation, cause fluid retention. This fluid may leak out of vessels and cause edema.   Medications that cause fluid retention. Examples are sex hormones, blood pressure medications, steroid treatment, or anti-depressants.   Some illnesses cause edema, especially heart failure, kidney disease, or liver disease.   Surgery that cuts veins or lymph nodes, such as surgery done for the heart or for breast cancer, may result in edema.  DIAGNOSIS Your caregiver is usually easily able to determine what is causing your swelling (edema) by simply asking what is wrong (getting a history) and examining  you (doing a physical). Sometimes x-rays, EKG (electrocardiogram or heart tracing), and blood work may be done to evaluate for underlying medical illness. TREATMENT General treatment includes:  Leg elevation (or elevation of the affected body part).   Restriction of fluid intake.   Prevention of fluid overload.   Compression of the affected body part. Compression with elastic bandages or support stockings squeezes the tissues, preventing fluid from entering and forcing it back into the blood vessels.   Diuretics (also called water pills or fluid pills) pull fluid out of your body in the form of increased urination. These are effective in reducing the swelling, but can have side effects and must be used only under your caregiver's supervision. Diuretics are appropriate only for some types of edema.  The specific treatment can be directed at any underlying causes discovered. Heart, liver, or kidney disease should be treated appropriately. HOME CARE INSTRUCTIONS  Elevate the legs (or affected body part) above the level of the heart, while lying down.   Avoid sitting or standing still for prolonged periods of time.   Avoid putting anything directly under the knees when lying down, and do not wear constricting clothing or garters on the upper legs.   Exercising the legs causes the fluid to work back into the veins and lymphatic channels. This may help the swelling go down.   The pressure applied by elastic bandages or support stockings can help reduce ankle swelling.   A low-salt diet may help reduce fluid retention and decrease the   ankle swelling.   Take any medications exactly as prescribed.  SEEK MEDICAL CARE IF:  Your edema is not responding to recommended treatments.  SEEK IMMEDIATE MEDICAL CARE IF:  You develop shortness of breath or chest pain.   You cannot breathe when you lay down; or if, while lying down, you have to get up and go to the window to get your breath.   You are  having increasing swelling without relief from treatment.   You develop a fever over 100.4.   You develop pain or redness in the areas that are swollen.   Tell your caregiver right away if you have gained 1-2 lbs in 1 day or  5 lbs in a week.  MAKE SURE YOU:  Understand these instructions.   Will watch your condition.   Will get help right away if you are not doing well or get worse.  Document Released: 04/21/2005 Document Re-Released: 10/09/2009 [employer] Patient Information 2011 [employer]. 

## 2011-02-07 ENCOUNTER — Encounter: Payer: Self-pay | Admitting: Internal Medicine

## 2011-02-07 NOTE — Progress Notes (Signed)
  Subjective:    Patient ID: Tammy [name], female    [name]: [birth year], 50 y.o.   MRN: [id number]  HPI  Pt here c/o swelling in low ext x several days.  No sob, chest pain.  Pt thought it was the victoza but it did not go down when she stopped it.    Review of Systems As above    Objective:   Physical Exam  Constitutional: She appears well-developed and well-nourished.  Cardiovascular: Normal rate and regular rhythm.   No murmur heard. Pulmonary/Chest: Effort normal and breath sounds normal.  Abdominal: Soft. Bowel sounds are normal. She exhibits no distension and no mass. There is no tenderness. There is no rebound and no guarding.  Musculoskeletal: Normal range of motion. She exhibits edema. She exhibits no tenderness.       + swelling LE--+ pitting  Psychiatric: She has a normal mood and affect. Her behavior is normal. Judgment and thought content normal.          Assessment & Plan:  Edema--- lasix daily,  Elevated legs, compression hose htn--inc lotensin to 20 mg rto 2 -3 weeks

## 2011-02-20 ENCOUNTER — Ambulatory Visit (INDEPENDENT_AMBULATORY_CARE_PROVIDER_SITE_OTHER): Payer: BC Managed Care – PPO | Admitting: Family Medicine

## 2011-02-20 ENCOUNTER — Encounter: Payer: Self-pay | Admitting: Family Medicine

## 2011-02-20 ENCOUNTER — Other Ambulatory Visit: Payer: Self-pay | Admitting: Family Medicine

## 2011-02-20 DIAGNOSIS — I10 Essential (primary) hypertension: Secondary | ICD-10-CM

## 2011-02-20 DIAGNOSIS — R609 Edema, unspecified: Secondary | ICD-10-CM

## 2011-02-20 DIAGNOSIS — E119 Type 2 diabetes mellitus without complications: Secondary | ICD-10-CM

## 2011-02-20 LAB — POCT H PYLORI SCREEN: H. PYLORI SCREEN, POC: NEGATIVE

## 2011-02-20 MED ORDER — LIRAGLUTIDE 18 MG/3ML ~~LOC~~ SOLN: 1.8000 mg | Freq: Every day | SUBCUTANEOUS | Status: DC

## 2011-02-20 NOTE — Patient Instructions (Signed)

## 2011-02-20 NOTE — Progress Notes (Signed)
  Subjective:    Patient ID: Tammy [name], female    [name]: [birth year], 50 y.o.   MRN: [id number]  HPI Pt here f/u swelling, bp and victoza.  Nausea has resolved,  Swelling is better and pt is not having any other problems with victoza.   Review of Systems As above    Objective:   Physical Exam  Constitutional: She is oriented to person, place, and time. She appears well-developed and well-nourished.  Cardiovascular: Normal rate, regular rhythm and normal heart sounds.   No murmur heard. Pulmonary/Chest: Effort normal and breath sounds normal.  Neurological: She is alert and oriented to person, place, and time.  Psychiatric: She has a normal mood and affect. Her behavior is normal. Judgment and thought content normal.          Assessment & Plan:  1 edema---improved,  con't to elevated legs, watch salt. 2 HTN--con't meds,  Stable 3. DM--con't with Victoza

## 2011-03-05 ENCOUNTER — Other Ambulatory Visit: Payer: Self-pay | Admitting: Family Medicine

## 2011-03-06 ENCOUNTER — Ambulatory Visit: Payer: BC Managed Care – PPO | Admitting: Family Medicine

## 2011-03-10 ENCOUNTER — Ambulatory Visit (AMBULATORY_SURGERY_CENTER): Payer: BC Managed Care – PPO | Admitting: *Deleted

## 2011-03-10 VITALS — Ht 65.5 in | Wt 334.0 lb

## 2011-03-10 DIAGNOSIS — Z1211 Encounter for screening for malignant neoplasm of colon: Secondary | ICD-10-CM

## 2011-03-10 MED ORDER — PEG-KCL-NACL-NASULF-NA ASC-C 100 G PO SOLR: ORAL | Status: DC

## 2011-03-11 ENCOUNTER — Encounter: Payer: Self-pay | Admitting: Internal Medicine

## 2011-03-25 ENCOUNTER — Other Ambulatory Visit: Payer: BC Managed Care – PPO | Admitting: Internal Medicine

## 2011-04-10 ENCOUNTER — Other Ambulatory Visit: Payer: BC Managed Care – PPO | Admitting: Internal Medicine

## 2011-04-11 ENCOUNTER — Other Ambulatory Visit: Payer: BC Managed Care – PPO | Admitting: Internal Medicine

## 2011-04-11 ENCOUNTER — Ambulatory Visit: Payer: BC Managed Care – PPO | Admitting: Family Medicine

## 2011-04-11 ENCOUNTER — Encounter: Payer: Self-pay | Admitting: Internal Medicine

## 2011-04-11 ENCOUNTER — Ambulatory Visit (AMBULATORY_SURGERY_CENTER): Payer: BC Managed Care – PPO | Admitting: Internal Medicine

## 2011-04-11 VITALS — BP 129/70 | HR 56 | Temp 97.1°F | Resp 21 | Ht 65.0 in | Wt 334.0 lb

## 2011-04-11 DIAGNOSIS — Z860100 Personal history of colon polyps, unspecified: Secondary | ICD-10-CM | POA: Insufficient documentation

## 2011-04-11 DIAGNOSIS — Z1211 Encounter for screening for malignant neoplasm of colon: Secondary | ICD-10-CM

## 2011-04-11 DIAGNOSIS — D126 Benign neoplasm of colon, unspecified: Secondary | ICD-10-CM

## 2011-04-11 HISTORY — PX: COLONOSCOPY: SHX174

## 2011-04-11 LAB — GLUCOSE, CAPILLARY: Glucose-Capillary: 80 mg/dL (ref 70–99)

## 2011-04-11 MED ORDER — DEXTROSE 5 % IV SOLN: INTRAVENOUS | Status: DC

## 2011-04-11 NOTE — Patient Instructions (Addendum)
One small polyp was removed today. It was not retrieved but was tiny and looked benign (not cancer). I recommend a routine repeat colonoscopy in about 5 years, as a preventive measure. Iva Boop, MD, [facility]  Please follow all discharge instructions given to you by the recovery room nurse. If you have any questions or problems after discharge please call [phone]. You will receive a phone call in the am to see how you are doing and answer any questions you may have. Thank you for choosing [facility] for your health care needs.

## 2011-04-11 NOTE — Op Note (Signed)
[facility] [address] [place], [place]  [address]  COLONOSCOPY PROCEDURE REPORT  PATIENT:  Tammy [name], Tammy [name]  MR#:  [id number] BIRTHDATE:  [birth year], 50 yrs. old  GENDER:  female ENDOSCOPIST:  Iva Boop, MD, [facility] REF. BY:  Loreen Freud, DO PROCEDURE DATE:  04/11/2011 PROCEDURE:  Colonoscopy with snare polypectomy ASA CLASS:  Class III INDICATIONS:  Routine Risk Screening MEDICATIONS:   These medications were titrated to patient response per physician's verbal order, Fentanyl 50 mcg IV, Versed 6 mg IV  DESCRIPTION OF PROCEDURE:   After the risks benefits and alternatives of the procedure were thoroughly explained, informed consent was obtained.  Digital rectal exam was performed and revealed no abnormalities.   The LB 180AL [id number] endoscope was introduced through the anus and advanced to the cecum, which was identified by both the appendix and ileocecal valve, without limitations.  The quality of the prep was excellent, using MoviPrep.  The instrument was then slowly withdrawn as the colon was fully examined. <<PROCEDUREIMAGES>>  FINDINGS:  A diminutive polyp was found at the splenic flexure. It was 5 mm in size. Polyp was snared without cautery. Retrieval was unsuccessful. This was otherwise a normal examination of the colon.   Retroflexed views in the rectum revealed no abnormalities.    The time to cecum = 4:25 minutes. The scope was then withdrawn in 12:09 minutes from the cecum and the procedure completed. COMPLICATIONS:  None ENDOSCOPIC IMPRESSION: 1) 5 mm diminutive polyp at the splenic flexure - removed but not recovered 2) Otherwise normal examination  REPEAT EXAM:  In 5 year(s) for routine screening colonoscopy.  Iva Boop, MD, [facility]  CC:  Lelon Perla, DO and The Patient  n. eSIGNED:   Iva Boop at 04/11/2011 03:05 PM  York Spaniel, [phone]

## 2011-04-11 NOTE — Progress Notes (Signed)
Pt was a difficult stick.  Tammy [name] got #24 in right inner wrist. Maw  IV dripping but slowly and positional.  Maw  Pt tolerated the colonoscopy very well. maw

## 2011-04-11 NOTE — Progress Notes (Signed)
Patient did not experience any of the following events: a burn prior to discharge; a fall within the facility; wrong site/side/patient/procedure/implant event; or a hospital transfer or hospital admission upon discharge from the facility. (G8907) Patient did not have preoperative order for IV antibiotic SSI prophylaxis. (G8918)  

## 2011-04-14 ENCOUNTER — Telehealth: Payer: Self-pay | Admitting: *Deleted

## 2011-04-14 NOTE — Telephone Encounter (Signed)
No answer, message left on answering machine. 

## 2011-04-16 ENCOUNTER — Encounter: Payer: Self-pay | Admitting: Family Medicine

## 2011-04-16 ENCOUNTER — Ambulatory Visit (INDEPENDENT_AMBULATORY_CARE_PROVIDER_SITE_OTHER): Payer: BC Managed Care – PPO | Admitting: Family Medicine

## 2011-04-16 VITALS — BP 136/76 | HR 61 | Temp 98.4°F | Wt 352.0 lb

## 2011-04-16 DIAGNOSIS — E119 Type 2 diabetes mellitus without complications: Secondary | ICD-10-CM

## 2011-04-16 DIAGNOSIS — D229 Melanocytic nevi, unspecified: Secondary | ICD-10-CM

## 2011-04-16 DIAGNOSIS — D239 Other benign neoplasm of skin, unspecified: Secondary | ICD-10-CM

## 2011-04-16 DIAGNOSIS — E785 Hyperlipidemia, unspecified: Secondary | ICD-10-CM

## 2011-04-16 DIAGNOSIS — I10 Essential (primary) hypertension: Secondary | ICD-10-CM

## 2011-04-16 LAB — LIPID PANEL
LDL Cholesterol: 91 mg/dL (ref 0–99)
Total CHOL/HDL Ratio: 3
VLDL: 9.8 mg/dL (ref 0.0–40.0)

## 2011-04-16 LAB — MICROALBUMIN / CREATININE URINE RATIO
Creatinine,U: 247.5 mg/dL
Microalb Creat Ratio: 0.4 mg/g (ref 0.0–30.0)
Microalb, Ur: 0.9 mg/dL (ref 0.0–1.9)

## 2011-04-16 LAB — BASIC METABOLIC PANEL
BUN: 12 mg/dL (ref 6–23)
Calcium: 8.2 mg/dL — ABNORMAL LOW (ref 8.4–10.5)
GFR: 85.17 mL/min (ref >60.00)
Glucose, Bld: 109 mg/dL — ABNORMAL HIGH (ref 70–99)

## 2011-04-16 LAB — HEMOGLOBIN A1C: Hgb A1c MFr Bld: 6.2 % (ref 4.6–6.5)

## 2011-04-16 LAB — POCT URINALYSIS DIPSTICK
Blood, UA: NEGATIVE
Nitrite, UA: NEGATIVE
Urobilinogen, UA: 0.2
pH, UA: 6

## 2011-04-16 LAB — HEPATIC FUNCTION PANEL
ALT: 31 U/L (ref 0–35)
AST: 24 U/L (ref 0–37)
Alkaline Phosphatase: 78 U/L (ref 39–117)

## 2011-04-16 NOTE — Progress Notes (Signed)
  Subjective:    Patient ID: Tammy [name], female    [name]: 07/09/1960, 50 y.o.   MRN: [id number]  HPI Pt here for f/u and labs.  No complaints. HYPERTENSION Disease Monitoring Blood pressure range-normal Chest pain- no      Dyspnea- no Medications Compliance- good Lightheadedness- no   Edema- yes   DIABETES Disease Monitoring Blood Sugar ranges-98-121 Polyuria- no New Visual problems- no Medications Compliance- good Hypoglycemic symptoms- no   HYPERLIPIDEMIA Disease Monitoring See symptoms for Hypertension Medications Compliance- good RUQ pain- no  Muscle aches- no  ROS See HPI above   PMH Smoking Status noted     Review of Systems As above    Objective:   Physical Exam  Constitutional: She appears well-developed and well-nourished.  Cardiovascular: Normal rate, regular rhythm and normal heart sounds.   No murmur heard. Pulmonary/Chest: Effort normal and breath sounds normal. No respiratory distress. She has no wheezes. She has no rales. She exhibits no tenderness.  Abdominal: Soft. Bowel sounds are normal.  Musculoskeletal: Normal range of motion. She exhibits no edema and no tenderness.  Neurological: She is alert.          Assessment & Plan:

## 2011-04-16 NOTE — Assessment & Plan Note (Signed)
Check labs con't meds 

## 2011-04-16 NOTE — Assessment & Plan Note (Signed)
con't meds  Check labs 

## 2011-04-16 NOTE — Assessment & Plan Note (Signed)
Stable con't meds 

## 2011-04-16 NOTE — Patient Instructions (Signed)

## 2011-04-25 ENCOUNTER — Other Ambulatory Visit: Payer: BC Managed Care – PPO | Admitting: Internal Medicine

## 2011-05-14 ENCOUNTER — Encounter: Payer: Self-pay | Admitting: Family Medicine

## 2011-05-14 ENCOUNTER — Ambulatory Visit (INDEPENDENT_AMBULATORY_CARE_PROVIDER_SITE_OTHER): Payer: BC Managed Care – PPO | Admitting: Family Medicine

## 2011-05-14 VITALS — BP 122/70 | HR 74 | Temp 97.7°F | Ht 66.0 in | Wt 350.2 lb

## 2011-05-14 DIAGNOSIS — R3915 Urgency of urination: Secondary | ICD-10-CM

## 2011-05-14 DIAGNOSIS — IMO0002 Reserved for concepts with insufficient information to code with codable children: Secondary | ICD-10-CM

## 2011-05-14 LAB — POCT URINALYSIS DIPSTICK
Ketones, UA: NEGATIVE
Protein, UA: NEGATIVE
Spec Grav, UA: 1.02
Urobilinogen, UA: 0.2

## 2011-05-14 MED ORDER — CEPHALEXIN 500 MG PO CAPS: 500.0000 mg | ORAL_CAPSULE | Freq: Two times a day (BID) | ORAL | Status: AC

## 2011-05-14 NOTE — Progress Notes (Signed)
  Subjective:    Patient ID: Tammy [name], female    [name]: 1960/08/12, 51 y.o.   MRN: [id number]  HPI abd pain- + suprapubic pressure radiating to lower back.  Pain started in back and travelled to lower abd.  No fevers.  No blood in urine.  No hx of kidney stones.  Denies dysuria, + urgency, hesitancy, frequency.  No nausea or vomiting.   Review of Systems For ROS see HPI     Objective:   Physical Exam  Vitals reviewed. Constitutional: She appears well-developed and well-nourished. No distress.  Abdominal: There is tenderness (R suprapubic tenderness, no CVA tenderness).  Musculoskeletal: She exhibits tenderness (R lumbar paraspinal spasm).          Assessment & Plan:

## 2011-05-14 NOTE — Assessment & Plan Note (Signed)
Chronic problem.  Continue flexeril and NSAIDs prn.  Reviewed supportive care and red flags that should prompt return.  Pt expressed understanding and is in agreement w/ plan.

## 2011-05-14 NOTE — Assessment & Plan Note (Signed)
Pt's sxs coupled w/ suprapubic tenderness and abnormal UA consistent w/ infxn.  Start abx.  Reviewed supportive care and red flags that should prompt return.  Pt expressed understanding and is in agreement w/ plan.

## 2011-05-14 NOTE — Patient Instructions (Signed)
This appears to be a bladder infection Start the Keflex twice daily Increase your water intake Use the flexeril as needed for the back pain/spasm Heating pad 2-3 Ibuprofen 3x/day (w/ food) as needed for back pain/spasm Call with any questions or concerns Hang in there!!!

## 2011-05-15 ENCOUNTER — Other Ambulatory Visit: Payer: Self-pay | Admitting: Family Medicine

## 2011-05-15 DIAGNOSIS — R609 Edema, unspecified: Secondary | ICD-10-CM

## 2011-05-15 MED ORDER — FUROSEMIDE 40 MG PO TABS: 40.0000 mg | ORAL_TABLET | Freq: Every day | ORAL | Status: DC

## 2011-05-15 MED ORDER — ESOMEPRAZOLE MAGNESIUM 40 MG PO CPDR: 40.0000 mg | DELAYED_RELEASE_CAPSULE | Freq: Every day | ORAL | Status: DC

## 2011-05-15 MED ORDER — LIRAGLUTIDE 18 MG/3ML ~~LOC~~ SOLN: 1.8000 mg | Freq: Every day | SUBCUTANEOUS | Status: DC

## 2011-05-15 MED ORDER — GLUCOSE BLOOD VI STRP: ORAL_STRIP | Status: DC

## 2011-05-15 MED ORDER — ATORVASTATIN CALCIUM 40 MG PO TABS: 40.0000 mg | ORAL_TABLET | Freq: Every day | ORAL | Status: DC

## 2011-05-15 MED ORDER — INSULIN PEN NEEDLE 32G X 6 MM MISC: 1.8000 mg | Freq: Every day | Status: DC

## 2011-05-15 MED ORDER — BENAZEPRIL HCL 20 MG PO TABS: 20.0000 mg | ORAL_TABLET | ORAL | Status: DC

## 2011-05-15 NOTE — Telephone Encounter (Signed)
Labs are current---Rx mailed    KP

## 2011-05-16 LAB — URINE CULTURE
Colony Count: NO GROWTH
Organism ID, Bacteria: NO GROWTH

## 2011-05-26 ENCOUNTER — Other Ambulatory Visit: Payer: Self-pay | Admitting: Family Medicine

## 2011-05-26 MED ORDER — LIRAGLUTIDE 18 MG/3ML ~~LOC~~ SOLN: 1.8000 mg | Freq: Every day | SUBCUTANEOUS | Status: DC

## 2011-05-26 MED ORDER — INSULIN PEN NEEDLE 32G X 6 MM MISC: 1.8000 mg | Freq: Every day | Status: DC

## 2011-05-26 MED ORDER — BENAZEPRIL HCL 20 MG PO TABS: 20.0000 mg | ORAL_TABLET | ORAL | Status: DC

## 2011-05-26 NOTE — Telephone Encounter (Signed)
Rx resent.

## 2011-05-27 ENCOUNTER — Other Ambulatory Visit: Payer: Self-pay | Admitting: Family Medicine

## 2011-05-27 MED ORDER — GLUCOSE BLOOD VI STRP: ORAL_STRIP | Status: DC

## 2011-05-27 NOTE — Telephone Encounter (Signed)
Faxed.   KP 

## 2011-06-05 ENCOUNTER — Telehealth: Payer: Self-pay

## 2011-06-05 NOTE — Telephone Encounter (Signed)
Msg from patient stating that [employer] sent her a letter stating they will not send the medications. She is out of Victoza and would like to know if she could get a sample of Victoza until she fixes the problem with the mail order company.   Please advise   KP

## 2011-06-05 NOTE — Telephone Encounter (Signed)
Yes -- she can have sample

## 2011-06-06 ENCOUNTER — Other Ambulatory Visit: Payer: Self-pay

## 2011-06-06 DIAGNOSIS — R609 Edema, unspecified: Secondary | ICD-10-CM

## 2011-06-06 MED ORDER — LIRAGLUTIDE 18 MG/3ML ~~LOC~~ SOLN: 1.8000 mg | Freq: Every day | SUBCUTANEOUS | Status: DC

## 2011-06-06 MED ORDER — ESOMEPRAZOLE MAGNESIUM 40 MG PO CPDR: 40.0000 mg | DELAYED_RELEASE_CAPSULE | Freq: Every day | ORAL | Status: DC

## 2011-06-06 MED ORDER — GLUCOSE BLOOD VI STRP: ORAL_STRIP | Status: DC

## 2011-06-06 MED ORDER — FUROSEMIDE 40 MG PO TABS: 40.0000 mg | ORAL_TABLET | Freq: Every day | ORAL | Status: DC

## 2011-06-06 MED ORDER — INSULIN PEN NEEDLE 32G X 6 MM MISC: 1.8000 mg | Freq: Every day | Status: DC

## 2011-06-06 MED ORDER — ATORVASTATIN CALCIUM 40 MG PO TABS: 40.0000 mg | ORAL_TABLET | Freq: Every day | ORAL | Status: DC

## 2011-06-06 NOTE — Telephone Encounter (Signed)
Rx Re-faxed.      KP 

## 2011-06-06 NOTE — Telephone Encounter (Signed)
Patient aware and will pick up samples today     KP

## 2011-07-28 ENCOUNTER — Telehealth: Payer: Self-pay | Admitting: *Deleted

## 2011-07-28 NOTE — Telephone Encounter (Signed)
Left message to call office

## 2011-07-28 NOTE — Telephone Encounter (Signed)
Office Message [address] [place], [place] [address] p. [phone] f. [phone] To: [facility] (Daytime Triage) Fax: [phone] From: [facility] Date/ Time: 07/28/2011 12:25 PM Taken By: Caswell Corwin, RN Caller: Garlan Fair Facility: Not Collected Patient: Tammy [name], Tammy [name] [name]: 28-Aug-1960 Phone: [phone] Reason for Call: Caller was unable to be reached on callback - Left Message Regarding Appointment: No Appt Date: Appt Time: Unknown Provider: Reason: Details: Outcome:

## 2011-07-29 ENCOUNTER — Emergency Department (HOSPITAL_COMMUNITY)
Admission: EM | Admit: 2011-07-29 | Discharge: 2011-07-29 | Disposition: A | Discharge status: Home / Self Care | Payer: BC Managed Care – PPO | Source: Home / Self Care | Attending: Family Medicine | Admitting: Family Medicine

## 2011-07-29 ENCOUNTER — Encounter (HOSPITAL_COMMUNITY): Payer: Self-pay | Admitting: *Deleted

## 2011-07-29 ENCOUNTER — Telehealth: Payer: Self-pay | Admitting: Family Medicine

## 2011-07-29 DIAGNOSIS — J329 Chronic sinusitis, unspecified: Secondary | ICD-10-CM

## 2011-07-29 MED ORDER — GUAIFENESIN-CODEINE 100-10 MG/5ML PO SYRP: 5.0000 mL | ORAL_SOLUTION | Freq: Four times a day (QID) | ORAL | Status: DC | PRN

## 2011-07-29 MED ORDER — FLUTICASONE PROPIONATE 50 MCG/ACT NA SUSP: 2.0000 | Freq: Every day | NASAL | Status: DC

## 2011-07-29 NOTE — ED Notes (Signed)
Pt  Has  Symptoms  Of  sorethroat       Cough      Congested        As  Well  As  Shortness of  Breath          X         3  Days    She  Reports   Symptoms      Of  Chills  And       Body  Aches  As  Well  The  Pt  Is  Awake  As  Well  As  Alert  And      Oriented            Sitting  Upright  On  Exam  Table   In  Upright   Manner        She  Is  Masked  And  Is  In  Private  Room

## 2011-07-29 NOTE — Telephone Encounter (Signed)
Caller: Keyondra/Patient; PCP: Sheliah Hatch.; CB#: [phone];  Call regarding Cough/Congestion;  Onset 07/27/11.  Afebrile.  Productive cough with yellow phlem; bloody sputum X 2.  FBS 124. Chest pain for 2-3 min at upper sternum only when coughs or takes deep breath.  Short of breath at podiatrist appt 07/28/11. Needs 30 min to get to appt; too late for appt in office and no appt remain.  Advised to see [facility] now for blood tinged sputum per Diabetes Respiratory Problems Guideline.

## 2011-07-29 NOTE — Telephone Encounter (Signed)
Pt return call stating that she was calling in reference to sinus issue, SOB, and chest pain. Pt transfer to [facility] for evaluation.

## 2011-07-29 NOTE — ED Provider Notes (Signed)
History     CSN: [id number]  Arrival date & time 07/29/11  1816   First MD Initiated Contact with Patient 07/29/11 1843      Chief Complaint  Patient presents with  . Cough    (Consider location/radiation/quality/duration/timing/severity/associated sxs/prior treatment) HPI Comments: Tammy [name] presents for evaluation of persistent cough, nasal congestion, postnasal drainage, shortness of breath, over the last couple days. She denies any fever, but does report, that she felt warm and and chills.   Patient is a 51 y.o. female presenting with cough. The history is provided by the patient.  Cough This is a new problem. The current episode started more than 2 days ago. The problem occurs constantly. The cough is productive of blood-tinged sputum. There has been no fever. Associated symptoms include chills, sweats, myalgias and shortness of breath. She is not a smoker.    Past Medical History  Diagnosis Date  . GERD (gastroesophageal reflux disease)   . Hypertension   . Hyperlipidemia   . Diabetes mellitus     TYPE 2    Past Surgical History  Procedure Date  . Abdominal hysterectomy   . Knee surgery     RIGHT KNEE SURGERY   . Upper gastrointestinal endoscopy 04/14/2006    Normal  . Colonoscopy 04/11/11    5 mm polyp removed but not recovered    Family History  Problem Relation Age of Onset  . Hodgkin's lymphoma    . Hyperlipidemia Mother   . Hypertension Mother   . Cancer Mother 32    hodgkins lymphoma  . Diabetes Father   . Hyperlipidemia Brother   . Colon cancer Neg Hx     History  Substance Use Topics  . Smoking status: Never Smoker   . Smokeless tobacco: Never Used  . Alcohol Use: No    OB History    Grav Para Term Preterm Abortions TAB SAB Ect Mult Living                  Review of Systems  Constitutional: Positive for chills.  HENT: Negative.   Eyes: Negative.   Respiratory: Positive for cough and shortness of breath.   Cardiovascular: Negative.     Gastrointestinal: Negative.   Genitourinary: Negative.   Musculoskeletal: Positive for myalgias.  Skin: Negative.   Neurological: Negative.     Allergies  Review of patient's allergies indicates no known allergies.  Home Medications   Current Outpatient Rx  Name Route Sig Dispense Refill  . ATORVASTATIN CALCIUM 40 MG PO TABS Oral Take 1 tablet (40 mg total) by mouth daily. 90 tablet 1  . BENAZEPRIL HCL 20 MG PO TABS Oral Take 1 tablet (20 mg total) by mouth every other day. 90 tablet 1  . ONETOUCH ULTRA SYSTEM W/DEVICE KIT Does not apply 1 kit by Does not apply route once.      . ERGOCALCIFEROL 50000 UNITS PO CAPS Oral Take 1 capsule (50,000 Units total) by mouth once a week. 4 capsule 2    Hold until patient calls  . ESOMEPRAZOLE MAGNESIUM 40 MG PO CPDR Oral Take 1 capsule (40 mg total) by mouth daily before breakfast. 90 capsule 1  . FLUTICASONE PROPIONATE 50 MCG/ACT NA SUSP Nasal Place 2 sprays into the nose daily. 16 g 2  . FUROSEMIDE 40 MG PO TABS Oral Take 1 tablet (40 mg total) by mouth daily. 90 tablet 1  . GLUCOSE BLOOD VI STRP  Check Blood sugars twice a day 200 each 1  Dx: 250.00  . GUAIFENESIN-CODEINE 100-10 MG/5ML PO SYRP Oral Take 5 mLs by mouth every 6 (six) hours as needed for cough or congestion. 120 mL 0  . INSULIN PEN NEEDLE 32G X 6 MM MISC Subcutaneous Inject 1.8 mg into the skin daily. 100 each 1  . LIRAGLUTIDE 18 MG/3ML [place] SOLN Subcutaneous Inject 0.3 mLs (1.8 mg total) into the skin daily. 27 mL 1  . LORATADINE 10 MG PO TABS Oral Take 1 tablet (10 mg total) by mouth daily. 30 tablet 2  . PROMETHAZINE HCL 25 MG RE SUPP Rectal Place 25 mg rectally as needed.       BP 168/95  Pulse 78  Temp(Src) 99.6 F (37.6 C) (Oral)  Resp 18  SpO2 100%  Physical Exam  Nursing note and vitals reviewed. Constitutional: She is oriented to person, place, and time. She appears well-developed and well-nourished.  HENT:  Head: Normocephalic and atraumatic.  Right Ear:  Tympanic membrane normal.  Left Ear: Tympanic membrane normal.  Mouth/Throat: Uvula is midline, oropharynx is clear and moist and mucous membranes are normal.  Eyes: Conjunctivae, EOM and lids are normal. Pupils are equal, round, and reactive to light.  Neck: Normal range of motion.  Cardiovascular: Normal rate, regular rhythm, S1 normal, S2 normal and normal heart sounds.   No murmur heard. Pulmonary/Chest: Effort normal and breath sounds normal. She has no decreased breath sounds. She has no wheezes. She has no rhonchi.  Musculoskeletal: Normal range of motion.  Neurological: She is alert and oriented to person, place, and time.  Skin: Skin is warm and dry.  Psychiatric: Her behavior is normal.    ED Course  Procedures (including critical care time)  Labs Reviewed - No data to display No results found.   1. Rhinosinusitis       MDM  Exam unremarkable; given rx for fluticasone and guaifenesin AC        Renaee Munda, MD 07/29/11 2332

## 2011-07-30 ENCOUNTER — Telehealth: Payer: Self-pay

## 2011-07-30 NOTE — Telephone Encounter (Signed)
Pt will need appt if not seen at [facility]

## 2011-07-30 NOTE — Telephone Encounter (Signed)
Pt seen at [facility] per chart

## 2011-07-30 NOTE — Telephone Encounter (Signed)
Msg from patient and she stated she received her pills from [facility] and she stated was concerned about them, She described the pill on the VM and ststed she wanted to know if this was th correct medication.    I called the patient back to make her aware she will need to call the pharmacy, but there was no answer. VM left to call the office    KP

## 2011-08-05 NOTE — Telephone Encounter (Signed)
msg left to call the office     KP 

## 2011-08-21 ENCOUNTER — Encounter: Payer: Self-pay | Admitting: Family Medicine

## 2011-08-21 ENCOUNTER — Ambulatory Visit (INDEPENDENT_AMBULATORY_CARE_PROVIDER_SITE_OTHER): Payer: BC Managed Care – PPO | Admitting: Family Medicine

## 2011-08-21 VITALS — BP 128/82 | HR 78 | Temp 98.1°F | Ht 66.5 in | Wt 347.0 lb

## 2011-08-21 DIAGNOSIS — R220 Localized swelling, mass and lump, head: Secondary | ICD-10-CM

## 2011-08-21 MED ORDER — PREDNISONE (PAK) 10 MG PO TABS: ORAL_TABLET | ORAL | Status: DC

## 2011-08-21 MED ORDER — METHYLPREDNISOLONE ACETATE 80 MG/ML IJ SUSP
80.0000 mg | Freq: Once | INTRAMUSCULAR | Status: AC
  Administered 2011-08-21: 80 mg via INTRAMUSCULAR

## 2011-08-21 MED ORDER — AMOXICILLIN 500 MG PO CAPS: 500.0000 mg | ORAL_CAPSULE | Freq: Two times a day (BID) | ORAL | Status: AC

## 2011-08-21 NOTE — Patient Instructions (Signed)
Follow up w/ [facility] ENT (Dr Emeline Darling) at 2:20 on Monday at [address] [facility] the Prednisone tomorrow Take the Amoxicillin to prevent infection Cold liquids, ice, popsicles to help w/ swelling If you have worsening swelling, trouble breathing, or additional concerns- go to the ER Hang in there!

## 2011-08-21 NOTE — Progress Notes (Signed)
  Subjective:    Patient ID: Tammy [name], female    [name]: 10-04-1960, 51 y.o.   MRN: [id number]  HPI Bit tongue- occurred this morning about 10am.  Had some blood.  Tongue is now swollen to point it is difficult to swallow.  Ate raspberries this AM- this is unusual for pt.  No new meds.  Reports lips are not swollen, just tongue.  No airway swelling.   Review of Systems For ROS see HPI     Objective:   Physical Exam  Vitals reviewed. Constitutional: She appears well-developed and well-nourished. No distress.  HENT:  Head: Normocephalic and atraumatic.  Mouth/Throat: Oropharynx is clear and moist.       L side of tongue firm, indurated, dark- appears to be a hematoma R side of tongue normal in size and consistency Lips and posterior pharynx w/out swelling  Neck: Normal range of motion. Neck supple.  Lymphadenopathy:    She has no cervical adenopathy.          Assessment & Plan:

## 2011-08-21 NOTE — Assessment & Plan Note (Signed)
New.  Appears to be due to hematoma from biting tongue this AM.  Unlikely to be angioedema as R side of tongue is not swollen and normal appearing.  Called ENT and discussed case w/ on-call MD.  He recommended steroids, amox, and ENT f/u 'in a few days'.  Pt scheduled for ENT, depo-medrol given in office along w/ pred-pack starting tomorrow.  Amox as directed.  Ice.  Pt cautioned that steroids will likely raise CBGs but are necessary.  Reviewed supportive care and red flags that should prompt return.  Pt expressed understanding and is in agreement w/ plan.

## 2011-09-05 ENCOUNTER — Encounter: Payer: BC Managed Care – PPO | Admitting: Family Medicine

## 2011-10-10 ENCOUNTER — Other Ambulatory Visit: Payer: Self-pay

## 2011-10-10 MED ORDER — FUTURO FIRM COMPRESSION HOSE MISC: Status: DC

## 2011-10-15 ENCOUNTER — Ambulatory Visit (INDEPENDENT_AMBULATORY_CARE_PROVIDER_SITE_OTHER): Payer: BC Managed Care – PPO | Admitting: Family Medicine

## 2011-10-15 ENCOUNTER — Encounter: Payer: Self-pay | Admitting: Family Medicine

## 2011-10-15 VITALS — BP 126/80 | HR 74 | Temp 98.2°F | Ht 65.0 in | Wt 344.0 lb

## 2011-10-15 DIAGNOSIS — Z0000 Encounter for general adult medical examination without abnormal findings: Secondary | ICD-10-CM

## 2011-10-15 DIAGNOSIS — E119 Type 2 diabetes mellitus without complications: Secondary | ICD-10-CM

## 2011-10-15 DIAGNOSIS — J309 Allergic rhinitis, unspecified: Secondary | ICD-10-CM

## 2011-10-15 DIAGNOSIS — E785 Hyperlipidemia, unspecified: Secondary | ICD-10-CM

## 2011-10-15 DIAGNOSIS — I10 Essential (primary) hypertension: Secondary | ICD-10-CM

## 2011-10-15 DIAGNOSIS — K219 Gastro-esophageal reflux disease without esophagitis: Secondary | ICD-10-CM

## 2011-10-15 DIAGNOSIS — J302 Other seasonal allergic rhinitis: Secondary | ICD-10-CM

## 2011-10-15 LAB — MICROALBUMIN / CREATININE URINE RATIO
Creatinine,U: 164.8 mg/dL
Microalb Creat Ratio: 0.7 mg/g (ref 0.0–30.0)
Microalb, Ur: 1.1 mg/dL (ref 0.0–1.9)

## 2011-10-15 LAB — POCT URINALYSIS DIPSTICK
Glucose, UA: NEGATIVE
Ketones, UA: NEGATIVE
Spec Grav, UA: 1.015

## 2011-10-15 MED ORDER — GLUCOSE BLOOD VI STRP: ORAL_STRIP | Status: DC

## 2011-10-15 MED ORDER — ATORVASTATIN CALCIUM 40 MG PO TABS: 40.0000 mg | ORAL_TABLET | Freq: Every day | ORAL | Status: DC

## 2011-10-15 MED ORDER — LORATADINE 10 MG PO TABS: 10.0000 mg | ORAL_TABLET | Freq: Every day | ORAL | Status: DC

## 2011-10-15 MED ORDER — BENAZEPRIL HCL 20 MG PO TABS: 20.0000 mg | ORAL_TABLET | Freq: Every day | ORAL | Status: DC

## 2011-10-15 MED ORDER — LIRAGLUTIDE 18 MG/3ML ~~LOC~~ SOLN: 1.8000 mg | Freq: Every day | SUBCUTANEOUS | Status: DC

## 2011-10-15 MED ORDER — INSULIN PEN NEEDLE 32G X 6 MM MISC: 1.8000 mg | Freq: Every day | Status: DC

## 2011-10-15 MED ORDER — ESOMEPRAZOLE MAGNESIUM 40 MG PO CPDR: 40.0000 mg | DELAYED_RELEASE_CAPSULE | Freq: Every day | ORAL | Status: DC

## 2011-10-15 MED ORDER — FUTURO FIRM COMPRESSION HOSE MISC: Status: DC

## 2011-10-15 NOTE — Assessment & Plan Note (Signed)
con't meds  Check labs 

## 2011-10-15 NOTE — Patient Instructions (Addendum)
Preventive Care for Adults, Female A healthy lifestyle and preventive care can promote health and wellness. Preventive health guidelines for women include the following key practices.  A routine yearly physical is a good way to check with your caregiver about your health and preventive screening. It is a chance to share any concerns and updates on your health, and to receive a thorough exam.   Visit your dentist for a routine exam and preventive care every 6 months. Brush your teeth twice a day and floss once a day. Good oral hygiene prevents tooth decay and gum disease.   The frequency of eye exams is based on your age, health, family medical history, use of contact lenses, and other factors. Follow your caregiver's recommendations for frequency of eye exams.   Eat a healthy diet. Foods like vegetables, fruits, whole grains, low-fat dairy products, and lean protein foods contain the nutrients you need without too many calories. Decrease your intake of foods high in solid fats, added sugars, and salt. Eat the right amount of calories for you.Get information about a proper diet from your caregiver, if necessary.   Regular physical exercise is one of the most important things you can do for your health. Most adults should get at least 150 minutes of moderate-intensity exercise (any activity that increases your heart rate and causes you to sweat) each week. In addition, most adults need muscle-strengthening exercises on 2 or more days a week.   Maintain a healthy weight. The body mass index (BMI) is a screening tool to identify possible weight problems. It provides an estimate of body fat based on height and weight. Your caregiver can help determine your BMI, and can help you achieve or maintain a healthy weight.For adults 20 years and older:   A BMI below 18.5 is considered underweight.   A BMI of 18.5 to 24.9 is normal.   A BMI of 25 to 29.9 is considered overweight.   A BMI of 30 and above is  considered obese.   Maintain normal blood lipids and cholesterol levels by exercising and minimizing your intake of saturated fat. Eat a balanced diet with plenty of fruit and vegetables. Blood tests for lipids and cholesterol should begin at age 20 and be repeated every 5 years. If your lipid or cholesterol levels are high, you are over 50, or you are at high risk for heart disease, you may need your cholesterol levels checked more frequently.Ongoing high lipid and cholesterol levels should be treated with medicines if diet and exercise are not effective.   If you smoke, find out from your caregiver how to quit. If you do not use tobacco, do not start.   If you are pregnant, do not drink alcohol. If you are breastfeeding, be very cautious about drinking alcohol. If you are not pregnant and choose to drink alcohol, do not exceed 1 drink per day. One drink is considered to be 12 ounces (355 mL) of beer, 5 ounces (148 mL) of wine, or 1.5 ounces (44 mL) of liquor.   Avoid use of street drugs. Do not share needles with anyone. Ask for help if you need support or instructions about stopping the use of drugs.   High blood pressure causes heart disease and increases the risk of stroke. Your blood pressure should be checked at least every 1 to 2 years. Ongoing high blood pressure should be treated with medicines if weight loss and exercise are not effective.   If you are 51 to 51   years old, ask your caregiver if you should take aspirin to prevent strokes.   Diabetes screening involves taking a blood sample to check your fasting blood sugar level. This should be done once every 3 years, after age 51, if you are within normal weight and without risk factors for diabetes. Testing should be considered at a younger age or be carried out more frequently if you are overweight and have at least 1 risk factor for diabetes.   Breast cancer screening is essential preventive care for women. You should practice "breast  self-awareness." This means understanding the normal appearance and feel of your breasts and may include breast self-examination. Any changes detected, no matter how small, should be reported to a caregiver. Women in their 20s and 30s should have a clinical breast exam (CBE) by a caregiver as part of a regular health exam every 1 to 3 years. After age 40, women should have a CBE every year. Starting at age 40, women should consider having a mammography (breast X-ray test) every year. Women who have a family history of breast cancer should talk to their caregiver about genetic screening. Women at a high risk of breast cancer should talk to their caregivers about having magnetic resonance imaging (MRI) and a mammography every year.   The Pap test is a screening test for cervical cancer. A Pap test can show cell changes on the cervix that might become cervical cancer if left untreated. A Pap test is a procedure in which cells are obtained and examined from the lower end of the uterus (cervix).   Women should have a Pap test starting at age 21.   Between ages 21 and 29, Pap tests should be repeated every 2 years.   Beginning at age 30, you should have a Pap test every 3 years as long as the past 3 Pap tests have been normal.   Some women have medical problems that increase the chance of getting cervical cancer. Talk to your caregiver about these problems. It is especially important to talk to your caregiver if a new problem develops soon after your last Pap test. In these cases, your caregiver may recommend more frequent screening and Pap tests.   The above recommendations are the same for women who have or have not gotten the vaccine for human papillomavirus (HPV).   If you had a hysterectomy for a problem that was not cancer or a condition that could lead to cancer, then you no longer need Pap tests. Even if you no longer need a Pap test, a regular exam is a good idea to make sure no other problems are  starting.   If you are between ages 65 and 70, and you have had normal Pap tests going back 10 years, you no longer need Pap tests. Even if you no longer need a Pap test, a regular exam is a good idea to make sure no other problems are starting.   If you have had past treatment for cervical cancer or a condition that could lead to cancer, you need Pap tests and screening for cancer for at least 20 years after your treatment.   If Pap tests have been discontinued, risk factors (such as a new sexual partner) need to be reassessed to determine if screening should be resumed.   The HPV test is an additional test that may be used for cervical cancer screening. The HPV test looks for the virus that can cause the cell changes on the cervix.   The cells collected during the Pap test can be tested for HPV. The HPV test could be used to screen women aged 30 years and older, and should be used in women of any age who have unclear Pap test results. After the age of 30, women should have HPV testing at the same frequency as a Pap test.   Colorectal cancer can be detected and often prevented. Most routine colorectal cancer screening begins at the age of 50 and continues through age 75. However, your caregiver may recommend screening at an earlier age if you have risk factors for colon cancer. On a yearly basis, your caregiver may provide home test kits to check for hidden blood in the stool. Use of a small camera at the end of a tube, to directly examine the colon (sigmoidoscopy or colonoscopy), can detect the earliest forms of colorectal cancer. Talk to your caregiver about this at age 50, when routine screening begins. Direct examination of the colon should be repeated every 5 to 10 years through age 75, unless early forms of pre-cancerous polyps or small growths are found.   Hepatitis C blood testing is recommended for all people born from 1945 through 1965 and any individual with known risks for hepatitis C.    Practice safe sex. Use condoms and avoid high-risk sexual practices to reduce the spread of sexually transmitted infections (STIs). STIs include gonorrhea, chlamydia, syphilis, trichomonas, herpes, HPV, and human immunodeficiency virus (HIV). Herpes, HIV, and HPV are viral illnesses that have no cure. They can result in disability, cancer, and death. Sexually active women aged 25 and younger should be checked for chlamydia. Older women with new or multiple partners should also be tested for chlamydia. Testing for other STIs is recommended if you are sexually active and at increased risk.   Osteoporosis is a disease in which the bones lose minerals and strength with aging. This can result in serious bone fractures. The risk of osteoporosis can be identified using a bone density scan. Women ages 65 and over and women at risk for fractures or osteoporosis should discuss screening with their caregivers. Ask your caregiver whether you should take a calcium supplement or vitamin D to reduce the rate of osteoporosis.   Menopause can be associated with physical symptoms and risks. Hormone replacement therapy is available to decrease symptoms and risks. You should talk to your caregiver about whether hormone replacement therapy is right for you.   Use sunscreen with sun protection factor (SPF) of 30 or more. Apply sunscreen liberally and repeatedly throughout the day. You should seek shade when your shadow is shorter than you. Protect yourself by wearing long sleeves, pants, a wide-brimmed hat, and sunglasses year round, whenever you are outdoors.   Once a month, do a whole body skin exam, using a mirror to look at the skin on your back. Notify your caregiver of new moles, moles that have irregular borders, moles that are larger than a pencil eraser, or moles that have changed in shape or color.   Stay current with required immunizations.   Influenza. You need a dose every fall (or winter). The composition of  the flu vaccine changes each year, so being vaccinated once is not enough.   Pneumococcal polysaccharide. You need 1 to 2 doses if you smoke cigarettes or if you have certain chronic medical conditions. You need 1 dose at age 65 (or older) if you have never been vaccinated.   Tetanus, diphtheria, pertussis (Tdap, Td). Get 1 dose of   Tdap vaccine if you are younger than age 65, are over 65 and have contact with an infant, are a healthcare worker, are pregnant, or simply want to be protected from whooping cough. After that, you need a Td booster dose every 10 years. Consult your caregiver if you have not had at least 3 tetanus and diphtheria-containing shots sometime in your life or have a deep or dirty wound.   HPV. You need this vaccine if you are a woman age 26 or younger. The vaccine is given in 3 doses over 6 months.   Measles, mumps, rubella (MMR). You need at least 1 dose of MMR if you were born in 1957 or later. You may also need a second dose.   Meningococcal. If you are age 19 to 21 and a first-year college student living in a residence hall, or have one of several medical conditions, you need to get vaccinated against meningococcal disease. You may also need additional booster doses.   Zoster (shingles). If you are age 60 or older, you should get this vaccine.   Varicella (chickenpox). If you have never had chickenpox or you were vaccinated but received only 1 dose, talk to your caregiver to find out if you need this vaccine.   Hepatitis A. You need this vaccine if you have a specific risk factor for hepatitis A virus infection or you simply wish to be protected from this disease. The vaccine is usually given as 2 doses, 6 to 18 months apart.   Hepatitis B. You need this vaccine if you have a specific risk factor for hepatitis B virus infection or you simply wish to be protected from this disease. The vaccine is given in 3 doses, usually over 6 months.  Preventive Services /  Frequency Ages 19 to 39  Blood pressure check.** / Every 1 to 2 years.   Lipid and cholesterol check.** / Every 5 years beginning at age 20.   Clinical breast exam.** / Every 3 years for women in their 20s and 30s.   Pap test.** / Every 2 years from ages 21 through 29. Every 3 years starting at age 30 through age 65 or 70 with a history of 3 consecutive normal Pap tests.   HPV screening.** / Every 3 years from ages 30 through ages 65 to 70 with a history of 3 consecutive normal Pap tests.   Hepatitis C blood test.** / For any individual with known risks for hepatitis C.   Skin self-exam. / Monthly.   Influenza immunization.** / Every year.   Pneumococcal polysaccharide immunization.** / 1 to 2 doses if you smoke cigarettes or if you have certain chronic medical conditions.   Tetanus, diphtheria, pertussis (Tdap, Td) immunization. / A one-time dose of Tdap vaccine. After that, you need a Td booster dose every 10 years.   HPV immunization. / 3 doses over 6 months, if you are 26 and younger.   Measles, mumps, rubella (MMR) immunization. / You need at least 1 dose of MMR if you were born in 1957 or later. You may also need a second dose.   Meningococcal immunization. / 1 dose if you are age 19 to 21 and a first-year college student living in a residence hall, or have one of several medical conditions, you need to get vaccinated against meningococcal disease. You may also need additional booster doses.   Varicella immunization.** / Consult your caregiver.   Hepatitis A immunization.** / Consult your caregiver. 2 doses, 6 to 18 months   apart.   Hepatitis B immunization.** / Consult your caregiver. 3 doses usually over 6 months.  Ages 40 to 64  Blood pressure check.** / Every 1 to 2 years.   Lipid and cholesterol check.** / Every 5 years beginning at age 20.   Clinical breast exam.** / Every year after age 40.   Mammogram.** / Every year beginning at age 40 and continuing for as  long as you are in good health. Consult with your caregiver.   Pap test.** / Every 3 years starting at age 30 through age 65 or 70 with a history of 3 consecutive normal Pap tests.   HPV screening.** / Every 3 years from ages 30 through ages 65 to 70 with a history of 3 consecutive normal Pap tests.   Fecal occult blood test (FOBT) of stool. / Every year beginning at age 50 and continuing until age 75. You may not need to do this test if you get a colonoscopy every 10 years.   Flexible sigmoidoscopy or colonoscopy.** / Every 5 years for a flexible sigmoidoscopy or every 10 years for a colonoscopy beginning at age 50 and continuing until age 75.   Hepatitis C blood test.** / For all people born from 1945 through 1965 and any individual with known risks for hepatitis C.   Skin self-exam. / Monthly.   Influenza immunization.** / Every year.   Pneumococcal polysaccharide immunization.** / 1 to 2 doses if you smoke cigarettes or if you have certain chronic medical conditions.   Tetanus, diphtheria, pertussis (Tdap, Td) immunization.** / A one-time dose of Tdap vaccine. After that, you need a Td booster dose every 10 years.   Measles, mumps, rubella (MMR) immunization. / You need at least 1 dose of MMR if you were born in 1957 or later. You may also need a second dose.   Varicella immunization.** / Consult your caregiver.   Meningococcal immunization.** / Consult your caregiver.   Hepatitis A immunization.** / Consult your caregiver. 2 doses, 6 to 18 months apart.   Hepatitis B immunization.** / Consult your caregiver. 3 doses, usually over 6 months.  Ages 65 and over  Blood pressure check.** / Every 1 to 2 years.   Lipid and cholesterol check.** / Every 5 years beginning at age 20.   Clinical breast exam.** / Every year after age 40.   Mammogram.** / Every year beginning at age 40 and continuing for as long as you are in good health. Consult with your caregiver.   Pap test.** /  Every 3 years starting at age 30 through age 65 or 70 with a 3 consecutive normal Pap tests. Testing can be stopped between 65 and 70 with 3 consecutive normal Pap tests and no abnormal Pap or HPV tests in the past 10 years.   HPV screening.** / Every 3 years from ages 30 through ages 65 or 70 with a history of 3 consecutive normal Pap tests. Testing can be stopped between 65 and 70 with 3 consecutive normal Pap tests and no abnormal Pap or HPV tests in the past 10 years.   Fecal occult blood test (FOBT) of stool. / Every year beginning at age 50 and continuing until age 75. You may not need to do this test if you get a colonoscopy every 10 years.   Flexible sigmoidoscopy or colonoscopy.** / Every 5 years for a flexible sigmoidoscopy or every 10 years for a colonoscopy beginning at age 50 and continuing until age 75.   Hepatitis   C blood test.** / For all people born from 1945 through 1965 and any individual with known risks for hepatitis C.   Osteoporosis screening.** / A one-time screening for women ages 65 and over and women at risk for fractures or osteoporosis.   Skin self-exam. / Monthly.   Influenza immunization.** / Every year.   Pneumococcal polysaccharide immunization.** / 1 dose at age 65 (or older) if you have never been vaccinated.   Tetanus, diphtheria, pertussis (Tdap, Td) immunization. / A one-time dose of Tdap vaccine if you are over 65 and have contact with an infant, are a healthcare worker, or simply want to be protected from whooping cough. After that, you need a Td booster dose every 10 years.   Varicella immunization.** / Consult your caregiver.   Meningococcal immunization.** / Consult your caregiver.   Hepatitis A immunization.** / Consult your caregiver. 2 doses, 6 to 18 months apart.   Hepatitis B immunization.** / Check with your caregiver. 3 doses, usually over 6 months.  ** Family history and personal history of risk and conditions may change your caregiver's  recommendations. Document Released: 06/17/2001 Document Revised: 04/10/2011 Document Reviewed: 09/16/2010 [employer] Patient Information 2012 [employer]. 

## 2011-10-15 NOTE — Progress Notes (Signed)
  Subjective:     Tammy [name] is a 51 y.o. female and is here for a comprehensive physical exam. The patient reports no problems.  History   Social History  . Marital Status: Married    Spouse Name: N/A    Number of Children: 0  . 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 Smoking status: Never Smoker   . Smokeless tobacco: Never Used  . Alcohol Use: No  . Drug Use: No  . Sexually Active: Yes -- Female partner(s)   Other Topics Concern  . Not on file   Social History Narrative  . No narrative on file   Health Maintenance  Topic Date Due  . Pap Smear  02/03/1979  . Mammogram  02/03/2011  . Influenza Vaccine  02/03/2012  . Colonoscopy  04/10/2016  . Tetanus/tdap  07/19/2017    The following portions of the patient's history were reviewed and updated as appropriate: allergies, current medications, past family history, past medical history, past social history, past surgical history and problem list.  Review of Systems Review of Systems  Constitutional: Negative for activity change, appetite change and fatigue.  HENT: Negative for hearing loss, congestion, tinnitus and ear discharge.  dentist q69m Eyes: Negative for visual disturbance (see optho q1y -- vision corrected to 20/20 with glasses).  Respiratory: Negative for cough, chest tightness and shortness of breath.   Cardiovascular: Negative for chest pain, palpitations and leg swelling.  Gastrointestinal: Negative for abdominal pain, diarrhea, constipation and abdominal distention.  Genitourinary: Negative for urgency, frequency, decreased urine volume and difficulty urinating.  Musculoskeletal: Negative for back pain, arthralgias and gait problem.  Skin: Negative for color change, pallor and rash.  Neurological: Negative for dizziness, light-headedness, numbness and headaches.  Hematological: Negative for adenopathy. Does not bruise/bleed  easily.  Psychiatric/Behavioral: Negative for suicidal ideas, confusion, sleep disturbance, self-injury, dysphoric mood, decreased concentration and agitation.       Objective:    BP 126/80  Pulse 74  Temp 98.2 F (36.8 C) (Oral)  Ht 5\' 5"  (1.651 m)  Wt 344 lb (156.037 kg)  BMI 57.24 kg/m2  SpO2 98% General appearance: alert, cooperative, appears stated age, no distress and morbidly obese Head: Normocephalic, without obvious abnormality, atraumatic Eyes: conjunctivae/corneas clear. PERRL, EOM's intact. Fundi benign. Ears: normal TM's and external ear canals both ears Nose: Nares normal. Septum midline. Mucosa normal. No drainage or sinus tenderness. Throat: lips, mucosa, and tongue normal; teeth and gums normal Neck: no adenopathy, no carotid bruit, no JVD, supple, symmetrical, trachea midline and thyroid not enlarged, symmetric, no tenderness/mass/nodules Back: symmetric, no curvature. ROM normal. No CVA tenderness. Lungs: clear to auscultation bilaterally Breasts: gyn Heart: regular rate and rhythm, S1, S2 normal, no murmur, click, rub or gallop Abdomen: soft, non-tender; bowel sounds normal; no masses,  no organomegaly Pelvic: gyn Extremities: extremities normal, atraumatic, no cyanosis or edema Pulses: 2+ and symmetric Skin: Skin color, texture, turgor normal. No rashes or lesions Lymph nodes: Cervical, supraclavicular, and axillary nodes normal. Neurologic: Alert and oriented X 3, normal strength and tone. Normal symmetric reflexes. Normal coordination and gait psych-- no depression, no anxiety    Assessment:    Healthy female exam.      Plan:  ghm utd  Check labs See After Visit Summary for Counseling Recommendations

## 2011-12-05 ENCOUNTER — Other Ambulatory Visit (INDEPENDENT_AMBULATORY_CARE_PROVIDER_SITE_OTHER): Payer: BC Managed Care – PPO

## 2011-12-05 DIAGNOSIS — E119 Type 2 diabetes mellitus without complications: Secondary | ICD-10-CM

## 2011-12-05 DIAGNOSIS — Z0000 Encounter for general adult medical examination without abnormal findings: Secondary | ICD-10-CM

## 2011-12-05 DIAGNOSIS — I10 Essential (primary) hypertension: Secondary | ICD-10-CM

## 2011-12-05 DIAGNOSIS — E785 Hyperlipidemia, unspecified: Secondary | ICD-10-CM

## 2011-12-05 LAB — BASIC METABOLIC PANEL
BUN: 11 mg/dL (ref 6–23)
CO2: 29 mEq/L (ref 19–32)
Calcium: 8.6 mg/dL (ref 8.4–10.5)
GFR: 95.93 mL/min (ref >60.00)
Glucose, Bld: 92 mg/dL (ref 70–99)

## 2011-12-05 LAB — CBC WITH DIFFERENTIAL/PLATELET
Basophils Absolute: 0 10*3/uL (ref 0.0–0.1)
Eosinophils Absolute: 0.1 10*3/uL (ref 0.0–0.7)
Hemoglobin: 12.5 g/dL (ref 12.0–15.0)
Lymphocytes Relative: 31.8 % (ref 12.0–46.0)
MCHC: 33.5 g/dL (ref 30.0–36.0)
Neutro Abs: 1.9 10*3/uL (ref 1.4–7.7)
Neutrophils Relative %: 56.2 % (ref 43.0–77.0)
RDW: 14.6 % (ref 11.5–14.6)

## 2011-12-05 LAB — LIPID PANEL
Cholesterol: 119 mg/dL (ref 0–200)
HDL: 59.6 mg/dL (ref >39.00)
VLDL: 5.8 mg/dL (ref 0.0–40.0)

## 2011-12-05 LAB — HEPATIC FUNCTION PANEL
ALT: 21 U/L (ref 0–35)
AST: 18 U/L (ref 0–37)
Albumin: 3.7 g/dL (ref 3.5–5.2)
Alkaline Phosphatase: 72 U/L (ref 39–117)
Total Protein: 7 g/dL (ref 6.0–8.3)

## 2011-12-05 LAB — TSH: TSH: 1.11 u[IU]/mL (ref 0.35–5.50)

## 2011-12-16 ENCOUNTER — Other Ambulatory Visit: Payer: Self-pay

## 2011-12-16 DIAGNOSIS — I10 Essential (primary) hypertension: Secondary | ICD-10-CM

## 2011-12-16 NOTE — Telephone Encounter (Signed)
Msg from patient and she stated she was out of BP meds and wanted to know how long she should be without them.     KP

## 2011-12-17 NOTE — Telephone Encounter (Signed)
Pt lt VM stating that she was returning kim call and would like to know how long she could go without BP med. Pt indicated ok to leave detail message. Return call to Pt and left detail VM advising that we do not recommend her going with out BP because it put her at increase risk for heart attack or possible stroke if her BP is elevate. Advised Pt that a Rx can be call in to local pharmacy until she is able to get mail order Rx. Pt instructed to return call to office to advise on local pharmacy to send in Rx.

## 2011-12-18 MED ORDER — BENAZEPRIL HCL 20 MG PO TABS: 20.0000 mg | ORAL_TABLET | Freq: Every day | ORAL | Status: DC

## 2011-12-18 NOTE — Telephone Encounter (Signed)
Patient called back and with pharmacy Rx faxed      KP

## 2011-12-31 ENCOUNTER — Other Ambulatory Visit: Payer: Self-pay | Admitting: Family Medicine

## 2011-12-31 DIAGNOSIS — Z1231 Encounter for screening mammogram for malignant neoplasm of breast: Secondary | ICD-10-CM

## 2012-01-08 ENCOUNTER — Ambulatory Visit (INDEPENDENT_AMBULATORY_CARE_PROVIDER_SITE_OTHER): Payer: BC Managed Care – PPO | Admitting: Family Medicine

## 2012-01-08 ENCOUNTER — Encounter: Payer: Self-pay | Admitting: Family Medicine

## 2012-01-08 VITALS — BP 126/78 | HR 84 | Temp 98.2°F | Wt 355.6 lb

## 2012-01-08 DIAGNOSIS — J40 Bronchitis, not specified as acute or chronic: Secondary | ICD-10-CM

## 2012-01-08 MED ORDER — GUAIFENESIN-CODEINE 100-10 MG/5ML PO SYRP: ORAL_SOLUTION | ORAL | Status: DC

## 2012-01-08 MED ORDER — AMOXICILLIN-POT CLAVULANATE 875-125 MG PO TABS: 1.0000 | ORAL_TABLET | Freq: Two times a day (BID) | ORAL | Status: AC

## 2012-01-08 MED ORDER — ALBUTEROL SULFATE HFA 108 (90 BASE) MCG/ACT IN AERS: 2.0000 | INHALATION_SPRAY | Freq: Four times a day (QID) | RESPIRATORY_TRACT | Status: DC | PRN

## 2012-01-08 MED ORDER — BUDESONIDE-FORMOTEROL FUMARATE 160-4.5 MCG/ACT IN AERO: 2.0000 | INHALATION_SPRAY | Freq: Two times a day (BID) | RESPIRATORY_TRACT | Status: DC

## 2012-01-08 NOTE — Patient Instructions (Signed)

## 2012-01-08 NOTE — Progress Notes (Signed)
  Subjective:     Tammy [name] is a 51 y.o. female here for evaluation of a cough. Onset of symptoms was 5 days ago. Symptoms have been gradually worsening since that time. The cough is productive and is aggravated by exercise, infection, stress and reclining position. Associated symptoms include: chest pain, night sweats, shortness of breath, sputum production and wheezing. Patient does not have a history of asthma. Patient does not have a history of environmental allergens. Patient has not traveled recently. Patient does not have a history of smoking. Patient has not had a previous chest x-ray. Patient has not had a PPD done.  The following portions of the patient's history were reviewed and updated as appropriate: allergies, current medications, past family history, past medical history, past social history, past surgical history and problem list.  Review of Systems Pertinent items are noted in HPI.    Objective:    Oxygen saturation 97% on room air BP 126/78  Pulse 84  Temp 98.2 F (36.8 C) (Oral)  Wt 355 lb 9.6 oz (161.299 kg)  SpO2 97% General appearance: alert, cooperative, appears stated age and mild distress Ears: normal TM's and external ear canals both ears Nose: green discharge, mild congestion, turbinates red, swollen, sinus tenderness bilateral Throat: lips, mucosa, and tongue normal; teeth and gums normal Neck: mild anterior cervical adenopathy, supple, symmetrical, trachea midline and thyroid not enlarged, symmetric, no tenderness/mass/nodules Lungs: rhonchi bilaterally and wheezes bilaterally Heart: regular rate and rhythm, S1, S2 normal, no murmur, click, rub or gallop    Assessment:    Acute Bronchitis and Sinusitis    Plan:    Antibiotics per medication orders. Antitussives per medication orders. Avoid exposure to tobacco smoke and fumes. B-agonist inhaler. Call if shortness of breath worsens, blood in sputum, change in character of cough, development of  fever or chills, inability to maintain nutrition and hydration. Avoid exposure to tobacco smoke and fumes. Steroid inhaler as ordered.

## 2012-01-16 ENCOUNTER — Ambulatory Visit
Admission: RE | Admit: 2012-01-16 | Discharge: 2012-01-16 | Disposition: A | Discharge status: Home / Self Care | Payer: BC Managed Care – PPO | Source: Ambulatory Visit | Attending: Family Medicine | Admitting: Family Medicine

## 2012-01-16 DIAGNOSIS — Z1231 Encounter for screening mammogram for malignant neoplasm of breast: Secondary | ICD-10-CM

## 2012-02-18 ENCOUNTER — Telehealth: Payer: Self-pay | Admitting: *Deleted

## 2012-02-18 DIAGNOSIS — E119 Type 2 diabetes mellitus without complications: Secondary | ICD-10-CM

## 2012-02-18 NOTE — Telephone Encounter (Signed)
Pt left vm to request a refill on her sinus medication to be sent to the [facility], and wants to know if MD Lowne can place a referral in her chart for a nutritionist/dietician, left message for pt to clarify which allergy medication she is referring too

## 2012-02-20 NOTE — Telephone Encounter (Signed)
.  left message to have patient return my call to clarify allergy medication she was referring to in vm, as well as if she has a preferance for the nutrionist

## 2012-02-23 ENCOUNTER — Other Ambulatory Visit: Payer: Self-pay | Admitting: Family Medicine

## 2012-02-25 NOTE — Telephone Encounter (Signed)
.  left message to have patient return my call on home number to call office

## 2012-03-02 NOTE — Telephone Encounter (Signed)
Placed referral in chart for nutrition, left vm for pt to advise someone will call her about the apt and time, claritin sent to pharmacy via escribe per MD Lowne on 02-23-12

## 2012-03-23 ENCOUNTER — Encounter: Payer: BC Managed Care – PPO | Attending: Family Medicine | Admitting: Dietician

## 2012-03-23 ENCOUNTER — Encounter: Payer: Self-pay | Admitting: Dietician

## 2012-03-23 VITALS — Ht 65.0 in | Wt 353.0 lb

## 2012-03-23 DIAGNOSIS — E119 Type 2 diabetes mellitus without complications: Secondary | ICD-10-CM | POA: Insufficient documentation

## 2012-03-23 DIAGNOSIS — Z713 Dietary counseling and surveillance: Secondary | ICD-10-CM | POA: Insufficient documentation

## 2012-03-23 NOTE — Patient Instructions (Addendum)
   Deep water fish such as the salmon, tuna, shark, mackerel, sardines, halibut tend to be higher in mercury.  Walking is relaxing and it burns glucose and fatty acids.  Aim for 150 minutes every 7 days. Start low and slow, monitor your response.  Pay attention to your body.  Use the track. But on cold stormy days go to [employer] and push the buggy through the store and don't shop and go out the other door.  Carry some hard candy with you when you walk in case you have a low glucose.    Try to keep the calories at 1500-1600 and the carbs  At 30-45 gm for meals.  Try to find [email or web address].

## 2012-03-23 NOTE — Progress Notes (Signed)
Medical Nutrition Therapy:  Appt start time: 1000 end time:  1130.   Assessment:  Primary concerns today: "Wwants/needs to find out what am I not doing to help with losing weight."    Needs to do something about my eating.   History of diabetes for approximately 2 years.  Reports a strong family history for diabetes.  Currently glucose is controlled by Victoza and her efforts at controlling her weight.  Since she saw her MD on 10/15/2011, she has gained 9 lbs.  She reports that she is constantly under stress at her job.  She further c/o pain in her feet and legs when she is on them for her 12 hr work shifts.  She is of the opinion, that if she loses weight, that would help with muscle and joint pain in her lower extremities.  Currently "trying to eat right, but not sure if she is on the right track."  GOAL:  Weight loss to keep my glucose down and to help with easing the pain in my feet and legs.  BLOOD GLUCOSE LEVELS: Fasting = 98-110  HS= 121, 150, 143 mg.  HYPERGLYCEMIA:  Reports no S/S for high glucose other than dry skin.  HYPOGLYCEMIA:  Reports that if she skips eating in the morning at about 9:30-10:30, she Favor Hackler get shaky.  Glucose at that time is usually around 80-83.  She feels better with a glucose level at no lower than 87-89 mg/dl.  DILATED EYE EXAM:  Reports recent exam.  FOOT SELF-EXAM:  Reports daily checking feet.  Has issues with pain in her feet and swelling on her 12 hour shifts.   MEDICATIONS: Review of meds completed. Victoza is current med for glucose control   DIETARY INTAKE:  Usual eating pattern includes 3 meals and 2-3 snacks per day.   Avoided foods include: [place] and other foods with mercury, and foods with red dye.    24-hr recall:  B ( AM): 6:30 piece of toast with PB (Jiff)  Water on the way to work. Home= Skips until 10:30 Snk ( AM): Work= 10:00 ham and egg on a crusaint.  Bottle of water. Home: Grits  (1/2 cup), Bacon 3 strips, eggs 2,  and toast 1.5 slices  white wheat bread. ( PM): 1:30 work = collard greens (1/2 cup), white beans (1/2 cup), chicken strips( 3 ). Home= Bowl of cereal Cheerios 1+ cup, milk 1% at 4 oz.  Maybe a banana (small 20 gm CHO). Snk ( PM): 5:00 PM Work= apple and PB Lantz crackers (3)   Home= nothing.  D ( PM): 8:30-8:45 Home= fried balogna with 2 slices of bread, water and Nabisco 100 calorie snacks. Home Ramonia Mcclaran be spaghetti, sauce Prego with garlic, tomato, onions, add ground turkey,.1 cup + and small salad and ranch dressing. Snk ( PM): On non-working nights, has jello cups and fruit available. Beverages: water, tea Unsweetened with Splenda.  Usual physical activity: Wants to start doing [employer] on Monday and Wednesday.  Likes to walk.  Estimated energy needs:  HT: 65 in  WT: 353 lbs  BMI: 58.9 kg/m2  Adj WT: 198-200 (90 kg) 1500-1600 calories 165-170 g carbohydrates 110-115 g protein 48-50 g fat  Progress Towards Goal(s):  No progress.   Nutritional Diagnosis:  [place]-2.1 Inpaired nutrition utilization As related to glucose.  As evidenced by diagnosis of type 2 diabetes and the need for Victoza to help with maintaining blood glucose levesl..     Intervention:  Nutrition Review of the physiology  of diabetes and the implications for weight gain with increased carb intake and increased calorie intake.  Review of the food groups and their influence on blood glucose.  Emphasized the need for exercise activity for increasing the metabolism and weight loss as well as potential stress reducer. Recommended reading food labels, using the exchange system for counting carbs and to try to keep up with her caloric intake.  Recommended 30-45 gm CHO for meals and 15 gm CHO for snacks.  Recommended using more protein in the diet.  Needs to keep the sugar levels on labels at 0-9 gm.  Increase her intake of non-starchy veggies.  Handouts given during visit include:  Living Well with Diabetes  Yellow card with diet  prescription.  Monitoring/Evaluation:  Dietary intake, exercise, blood glucose levels, and body weight in 6-8 weeks.  To call with questions.Marland Kitchen

## 2012-03-27 ENCOUNTER — Encounter (HOSPITAL_BASED_OUTPATIENT_CLINIC_OR_DEPARTMENT_OTHER): Payer: Self-pay | Admitting: *Deleted

## 2012-03-27 ENCOUNTER — Observation Stay (HOSPITAL_BASED_OUTPATIENT_CLINIC_OR_DEPARTMENT_OTHER)
Admission: EM | Admit: 2012-03-27 | Discharge: 2012-03-29 | Disposition: A | Discharge status: Home / Self Care | Payer: BC Managed Care – PPO | Attending: Internal Medicine | Admitting: Internal Medicine

## 2012-03-27 ENCOUNTER — Emergency Department (HOSPITAL_BASED_OUTPATIENT_CLINIC_OR_DEPARTMENT_OTHER): Payer: BC Managed Care – PPO

## 2012-03-27 DIAGNOSIS — Z79899 Other long term (current) drug therapy: Secondary | ICD-10-CM | POA: Insufficient documentation

## 2012-03-27 DIAGNOSIS — I498 Other specified cardiac arrhythmias: Secondary | ICD-10-CM | POA: Insufficient documentation

## 2012-03-27 DIAGNOSIS — E119 Type 2 diabetes mellitus without complications: Secondary | ICD-10-CM

## 2012-03-27 DIAGNOSIS — I10 Essential (primary) hypertension: Secondary | ICD-10-CM | POA: Diagnosis present

## 2012-03-27 DIAGNOSIS — E785 Hyperlipidemia, unspecified: Secondary | ICD-10-CM | POA: Diagnosis present

## 2012-03-27 DIAGNOSIS — Z7902 Long term (current) use of antithrombotics/antiplatelets: Secondary | ICD-10-CM | POA: Insufficient documentation

## 2012-03-27 DIAGNOSIS — J40 Bronchitis, not specified as acute or chronic: Secondary | ICD-10-CM

## 2012-03-27 DIAGNOSIS — Z86010 Personal history of colonic polyps: Secondary | ICD-10-CM

## 2012-03-27 DIAGNOSIS — E1151 Type 2 diabetes mellitus with diabetic peripheral angiopathy without gangrene: Secondary | ICD-10-CM | POA: Diagnosis present

## 2012-03-27 DIAGNOSIS — E538 Deficiency of other specified B group vitamins: Secondary | ICD-10-CM

## 2012-03-27 DIAGNOSIS — H814 Vertigo of central origin: Principal | ICD-10-CM | POA: Diagnosis present

## 2012-03-27 DIAGNOSIS — R3915 Urgency of urination: Secondary | ICD-10-CM

## 2012-03-27 DIAGNOSIS — M65839 Other synovitis and tenosynovitis, unspecified forearm: Secondary | ICD-10-CM

## 2012-03-27 DIAGNOSIS — R220 Localized swelling, mass and lump, head: Secondary | ICD-10-CM

## 2012-03-27 DIAGNOSIS — M65849 Other synovitis and tenosynovitis, unspecified hand: Secondary | ICD-10-CM

## 2012-03-27 DIAGNOSIS — R42 Dizziness and giddiness: Secondary | ICD-10-CM

## 2012-03-27 DIAGNOSIS — R55 Syncope and collapse: Secondary | ICD-10-CM

## 2012-03-27 DIAGNOSIS — K219 Gastro-esophageal reflux disease without esophagitis: Secondary | ICD-10-CM | POA: Insufficient documentation

## 2012-03-27 DIAGNOSIS — R51 Headache: Secondary | ICD-10-CM | POA: Insufficient documentation

## 2012-03-27 DIAGNOSIS — R001 Bradycardia, unspecified: Secondary | ICD-10-CM | POA: Diagnosis present

## 2012-03-27 DIAGNOSIS — E669 Obesity, unspecified: Secondary | ICD-10-CM | POA: Insufficient documentation

## 2012-03-27 DIAGNOSIS — IMO0002 Reserved for concepts with insufficient information to code with codable children: Secondary | ICD-10-CM

## 2012-03-27 DIAGNOSIS — K3189 Other diseases of stomach and duodenum: Secondary | ICD-10-CM

## 2012-03-27 DIAGNOSIS — R609 Edema, unspecified: Secondary | ICD-10-CM

## 2012-03-27 DIAGNOSIS — R0602 Shortness of breath: Secondary | ICD-10-CM | POA: Insufficient documentation

## 2012-03-27 DIAGNOSIS — Z794 Long term (current) use of insulin: Secondary | ICD-10-CM | POA: Insufficient documentation

## 2012-03-27 LAB — URINALYSIS, ROUTINE W REFLEX MICROSCOPIC
Hgb urine dipstick: NEGATIVE
Ketones, ur: NEGATIVE mg/dL
Protein, ur: NEGATIVE mg/dL
Urobilinogen, UA: 0.2 mg/dL (ref 0.0–1.0)

## 2012-03-27 LAB — BASIC METABOLIC PANEL
CO2: 25 mEq/L (ref 19–32)
Calcium: 9 mg/dL (ref 8.4–10.5)
Creatinine, Ser: 0.9 mg/dL (ref 0.50–1.10)
GFR calc Af Amer: 84 mL/min — ABNORMAL LOW (ref >90)
GFR calc non Af Amer: 73 mL/min — ABNORMAL LOW (ref >90)
Sodium: 139 mEq/L (ref 135–145)

## 2012-03-27 LAB — CBC WITH DIFFERENTIAL/PLATELET
Basophils Absolute: 0 10*3/uL (ref 0.0–0.1)
Basophils Relative: 1 % (ref 0–1)
Eosinophils Absolute: 0 10*3/uL (ref 0.0–0.7)
Eosinophils Relative: 0 % (ref 0–5)
Lymphocytes Relative: 23 % (ref 12–46)
MCHC: 34.1 g/dL (ref 30.0–36.0)
MCV: 84.7 fL (ref 78.0–100.0)
Platelets: 282 10*3/uL (ref 150–400)
RDW: 13.3 % (ref 11.5–15.5)
WBC: 3.6 10*3/uL — ABNORMAL LOW (ref 4.0–10.5)

## 2012-03-27 LAB — GLUCOSE, CAPILLARY: Glucose-Capillary: 82 mg/dL (ref 70–99)

## 2012-03-27 LAB — TROPONIN I: Troponin I: <0.3 ng/mL (ref <0.30)

## 2012-03-27 MED ORDER — ASPIRIN 81 MG PO CHEW
324.0000 mg | CHEWABLE_TABLET | Freq: Once | ORAL | Status: AC
  Administered 2012-03-27: 324 mg via ORAL
  Filled 2012-03-27: qty 4

## 2012-03-27 MED ORDER — LORAZEPAM 2 MG/ML IJ SOLN
1.0000 mg | Freq: Once | INTRAMUSCULAR | Status: AC
  Administered 2012-03-27: 1 mg via INTRAVENOUS
  Filled 2012-03-27: qty 1

## 2012-03-27 MED ORDER — ENOXAPARIN SODIUM 40 MG/0.4ML ~~LOC~~ SOLN
40.0000 mg | SUBCUTANEOUS | Status: DC
  Administered 2012-03-28 – 2012-03-29 (×2): 40 mg via SUBCUTANEOUS
  Filled 2012-03-27 (×3): qty 0.4

## 2012-03-27 MED ORDER — ASPIRIN 325 MG PO TABS
325.0000 mg | ORAL_TABLET | Freq: Every day | ORAL | Status: DC
  Administered 2012-03-28 – 2012-03-29 (×2): 325 mg via ORAL
  Filled 2012-03-27 (×2): qty 1

## 2012-03-27 MED ORDER — OXYCODONE HCL 5 MG PO TABS: 5.0000 mg | ORAL_TABLET | Freq: Four times a day (QID) | ORAL | Status: DC | PRN

## 2012-03-27 MED ORDER — SODIUM CHLORIDE 0.9 % IV SOLN
Freq: Once | INTRAVENOUS | Status: AC
  Administered 2012-03-27: 16:00:00 via INTRAVENOUS

## 2012-03-27 MED ORDER — MECLIZINE HCL 25 MG PO TABS
25.0000 mg | ORAL_TABLET | Freq: Three times a day (TID) | ORAL | Status: DC | PRN
  Filled 2012-03-27: qty 1

## 2012-03-27 MED ORDER — SODIUM CHLORIDE 0.9 % IJ SOLN: 3.0000 mL | INTRAMUSCULAR | Status: DC | PRN

## 2012-03-27 MED ORDER — HYDROMORPHONE HCL PF 1 MG/ML IJ SOLN: 1.0000 mg | INTRAMUSCULAR | Status: DC | PRN

## 2012-03-27 MED ORDER — ACETAMINOPHEN 325 MG PO TABS
650.0000 mg | ORAL_TABLET | ORAL | Status: DC | PRN
  Administered 2012-03-28: 650 mg via ORAL
  Filled 2012-03-27: qty 2

## 2012-03-27 MED ORDER — INSULIN ASPART 100 UNIT/ML ~~LOC~~ SOLN: 0.0000 [IU] | Freq: Three times a day (TID) | SUBCUTANEOUS | Status: DC

## 2012-03-27 MED ORDER — PROMETHAZINE HCL 25 MG/ML IJ SOLN
25.0000 mg | Freq: Once | INTRAMUSCULAR | Status: AC
  Administered 2012-03-27: 25 mg via INTRAVENOUS
  Filled 2012-03-27: qty 1

## 2012-03-27 MED ORDER — INSULIN ASPART 100 UNIT/ML ~~LOC~~ SOLN: 0.0000 [IU] | Freq: Every day | SUBCUTANEOUS | Status: DC

## 2012-03-27 MED ORDER — SODIUM CHLORIDE 0.9 % IJ SOLN
3.0000 mL | Freq: Two times a day (BID) | INTRAMUSCULAR | Status: DC
  Administered 2012-03-28 – 2012-03-29 (×3): 3 mL via INTRAVENOUS

## 2012-03-27 MED ORDER — ALPRAZOLAM 0.5 MG PO TABS: 0.5000 mg | ORAL_TABLET | Freq: Once | ORAL | Status: DC

## 2012-03-27 MED ORDER — SODIUM CHLORIDE 0.9 % IV SOLN: 250.0000 mL | INTRAVENOUS | Status: DC | PRN

## 2012-03-27 NOTE — H&P (Signed)
[facility] History and Physical  Tammy [name] QVZ:[id number] [name]: 05/22/1960 DOA: 03/27/2012  Referring physician: EDP PCP: Loreen Freud, DO  Specialists:   Chief Complaint: Dizziness  HPI: Tammy [name] is a 51 y.o. female who presented to the [facility] ED with complaints of severe posterior headache and dizziness upon awakening.  She reports feeling as if the room was spinning, and she has nausea and diaphoresis associated with the dizziness.  She reports the dizziness is worse with turning her head and standing up. She reports having intermittent episodes of dizziness, and she denies having fevers, chills, chest pain, SOB, or syncope. She had some relief after she had been given IV Ativan in the ED.   She was described as ataxic, and had a +Rhomberg sign on physical examination.   A CT scan was performed and was found to be negative for acute findings, and then, she was transferred to [facility] for admission.     Review of Systems: The patient denies anorexia, fever, weight loss, vision loss, decreased hearing, hoarseness, chest pain, syncope, dyspnea on exertion, peripheral edema, balance deficits, hemoptysis, abdominal pain, melena, hematochezia, severe indigestion/heartburn, hematuria, incontinence, genital sores, muscle weakness, suspicious skin lesions, transient blindness, difficulty walking, depression, unusual weight change, abnormal bleeding, enlarged lymph nodes, angioedema, and breast masses.    Past Medical History  Diagnosis Date  . GERD (gastroesophageal reflux disease)   . Hypertension   . Hyperlipidemia   . Diabetes mellitus     TYPE 2   Past Surgical History  Procedure Date  . Abdominal hysterectomy   . Knee surgery     RIGHT KNEE SURGERY   . Upper gastrointestinal endoscopy 04/14/2006    Normal  . Colonoscopy 04/11/11    5 mm polyp removed but not recovered    Medications:  HOME MEDS: Prior to Admission medications   Medication Sig Start Date End  Date Taking? Authorizing Provider  albuterol (PROVENTIL HFA;VENTOLIN HFA) 108 (90 BASE) MCG/ACT inhaler Inhale 2 puffs into the lungs every 6 (six) hours as needed for wheezing. 01/08/12 01/07/13 Yes Yvonne R Lowne, DO  atorvastatin (LIPITOR) 40 MG tablet Take 1 tablet (40 mg total) by mouth daily. 10/15/11  Yes Yvonne R Lowne, DO  benazepril (LOTENSIN) 20 MG tablet Take 1 tablet (20 mg total) by mouth daily. 12/18/11 12/17/12 Yes Yvonne R Lowne, DO  Blood Glucose Monitoring Suppl (ONE TOUCH ULTRA SYSTEM KIT) W/DEVICE KIT 1 kit by Does not apply route once.     Yes Historical Provider, MD  budesonide-formoterol (SYMBICORT) 160-4.5 MCG/ACT inhaler Inhale 2 puffs into the lungs 2 (two) times daily. 01/08/12 01/07/13 Yes Grayling Congress Lowne, DO  esomeprazole (NEXIUM) 40 MG capsule Take 1 capsule (40 mg total) by mouth daily before breakfast. 10/15/11  Yes Grayling Congress Lowne, DO  furosemide (LASIX) 40 MG tablet Take 1 tablet (40 mg total) by mouth daily. 06/06/11 06/05/12 Yes Yvonne R Lowne, DO  glucose blood (ONE TOUCH ULTRA TEST) test strip Check Blood sugars twice a day 10/15/11  Yes Yvonne R Lowne, DO  Insulin Pen Needle (NOVOFINE) 32G X 6 MM MISC Inject 1.8 mg into the skin daily. 10/15/11  Yes Yvonne R Lowne, DO  Liraglutide (VICTOZA) 18 MG/3ML SOLN Inject 0.3 mLs (1.8 mg total) into the skin daily. 10/15/11  Yes Yvonne R Lowne, DO  loratadine (CLARITIN) 10 MG tablet TAKE 1 TABLET BY MOUTH EVERY DAY 02/23/12  Yes Lelon Perla, DO    Allergies:  Allergies  Allergen Reactions  .  Red Dye   . Tuna (Fish Allergy) Nausea And Vomiting    Social History:  reports that she has never smoked. She has never used smokeless tobacco. She reports that she does not drink alcohol or use illicit drugs.   Family History  Problem Relation Age of Onset  . Hodgkin's lymphoma    . Hyperlipidemia Mother   . Hypertension Mother   . Cancer Mother 82    hodgkins lymphoma  . Diabetes Father   . Hyperlipidemia Brother   . Colon cancer  Neg Hx   . Diabetes Paternal Grandmother       Physical Exam:  GEN:  Pleasant 51 year old Obese African American examined  and in no acute distress; cooperative with exam Filed Vitals:   03/27/12 1456 03/27/12 1934 03/27/12 2026 03/27/12 2200  BP: 163/85 145/89  145/66  Pulse: 75 67  83  Temp: 98 F (36.7 C)  98.1 F (36.7 C) 97.7 F (36.5 C)  TempSrc: Oral     Resp: 20 18  15   Height: 5\' 5"  (1.651 m)   5\' 5"  (1.651 m)  Weight: 158.759 kg (350 lb)   157.9 kg (348 lb 1.7 oz)  SpO2: 99% 97%  100%   Blood pressure 145/66, pulse 83, temperature 97.7 F (36.5 C), temperature source Oral, resp. rate 15, height 5\' 5"  (1.651 m), weight 157.9 kg (348 lb 1.7 oz), SpO2 100.00%. PSYCH: She is alert and oriented x4; does not appear anxious does not appear depressed; affect is normal HEENT: Normocephalic and Atraumatic, Mucous membranes pink; PERRLA; EOM intact; Fundi:  Benign;  No scleral icterus, Nares: Patent, Oropharynx: Clear, Fair Dentition, Neck:  FROM, no cervical lymphadenopathy nor thyromegaly or carotid bruit; no JVD; Breasts:: Not examined CHEST WALL: No tenderness CHEST: Normal respiration, clear to auscultation bilaterally HEART: Regular rate and rhythm; no murmurs rubs or gallops BACK: No kyphosis or scoliosis; no CVA tenderness ABDOMEN: Positive Bowel Sounds, Obese, soft non-tender; no masses, no organomegaly, no pannus; no intertriginous candida. Rectal Exam: Not done EXTREMITIES: No bone or joint deformity; age-appropriate arthropathy of the hands and knees; no cyanosis, clubbing or edema; no ulcerations. Genitalia: not examined PULSES: 2+ and symmetric SKIN: Normal hydration no rash or ulceration CNS: Cranial nerves 2-12 grossly intact,  Able to move all 4 extremities,  No motor or sensory deficits, DTR 2/4,  Cerebellar function intact,  Gait was not  Assessed,  no focal neurologic deficit    Labs on Admission:  Basic Metabolic Panel:  Lab 03/27/12 0981  NA 139  K  3.6  CL 102  CO2 25  GLUCOSE 111*  BUN 11  CREATININE 0.90  CALCIUM 9.0  MG --  PHOS --   Liver Function Tests: No results found for this basename: AST:5,ALT:5,ALKPHOS:5,BILITOT:5,PROT:5,ALBUMIN:5 in the last 168 hours No results found for this basename: LIPASE:5,AMYLASE:5 in the last 168 hours No results found for this basename: AMMONIA:5 in the last 168 hours CBC:  Lab 03/27/12 1555  WBC 3.6*  NEUTROABS 2.6  HGB 13.4  HCT 39.3  MCV 84.7  PLT 282   Cardiac Enzymes:  Lab 03/27/12 1555  CKTOTAL --  CKMB --  CKMBINDEX --  TROPONINI <0.30    BNP (last 3 results) No results found for this basename: PROBNP:3 in the last 8760 hours CBG:  Lab 03/27/12 2107  GLUCAP 82    Radiological Exams on Admission: Ct Head Wo Contrast  03/27/2012  *RADIOLOGY REPORT*  Clinical Data: Dizziness, headache, nausea and vomiting.  CT HEAD WITHOUT CONTRAST  Technique:  Contiguous axial images were obtained from the base of the skull through the vertex without contrast.  Comparison: None.  Findings: The brain demonstrates no evidence of hemorrhage, infarction, edema, mass effect, extra-axial fluid collection, hydrocephalus or mass lesion.  The skull is unremarkable.  IMPRESSION: Normal head CT.   Original Report Authenticated By: Irish Lack, M.D.     EKG: Independently reviewed.  Sinus Bradycardia at rate of 52,  No Acute ST changes.       Assessment: Principal Problem:  *Central positional vertigo Active Problems:  DIABETES MELLITUS, TYPE II  HYPERLIPIDEMIA  OBESITY  HYPERTENSION  BRADYCARDIA  Plan:    Admit to Telemetry Area Observation Status MRI in AM to Rule out CVA Neuro Checks Reconcile Home Medications Meclizine 25 mg PO q 8 hrs PRN Dizziness SSI Coverage PRN DVT Prophylaxis ASA therapy    Code Status:   FULL CODE Family Communication: N/A Disposition Plan:    Return to Home  Time spent:  72 Minutes  Ron Parker [facility] Pager  [phone]  If 7PM-7AM, please contact night-coverage [email or web address] Password [facility] 03/27/2012, 11:42 PM

## 2012-03-27 NOTE — ED Notes (Signed)
MD at bedside. Spouse remains at bedside as well. PA discussing possibility of admission with patient

## 2012-03-27 NOTE — ED Notes (Signed)
Patient is sleeping  

## 2012-03-27 NOTE — ED Provider Notes (Signed)
History     CSN: [id number]  Arrival date & time 03/27/12  1451   First MD Initiated Contact with Patient 03/27/12 1459      Chief Complaint  Patient presents with  . Dizziness    (Consider location/radiation/quality/duration/timing/severity/associated sxs/prior treatment) HPI History provided by pt.   Pt referred by an urgent care for further evaluation of vertigo.  Pt reports that she felt normal before going to bed last night.  When she stood up from bed this morning, she developed immediate lightheadedness and room-spinning dizziness.   Sx mildly improved with sitting down.   Associated w/ diaphoresis, N/V, ataxia and posterior headache.  Denies blurred vision, dysarthria, dysphagia, confusion, extremity weakness/paresthesias.  Also denies CP, palpitations, abdominal pain and diarrhea.  Had SOB this morning but attributes to severity of headache.  Has not taken anything for head pain.  H/o HTN, diabetes and hyperlipidemia.  No FH stroke/MI.  Does not smoke cigarettes.  No recent head trauma.  No recent URI sx.  Past Medical History  Diagnosis Date  . GERD (gastroesophageal reflux disease)   . Hypertension   . Hyperlipidemia   . Diabetes mellitus     TYPE 2    Past Surgical History  Procedure Date  . Abdominal hysterectomy   . Knee surgery     RIGHT KNEE SURGERY   . Upper gastrointestinal endoscopy 04/14/2006    Normal  . Colonoscopy 04/11/11    5 mm polyp removed but not recovered    Family History  Problem Relation Age of Onset  . Hodgkin's lymphoma    . Hyperlipidemia Mother   . Hypertension Mother   . Cancer Mother 28    hodgkins lymphoma  . Diabetes Father   . Hyperlipidemia Brother   . Colon cancer Neg Hx   . Diabetes Paternal Grandmother     History  Substance Use Topics  . Smoking status: Never Smoker   . Smokeless tobacco: Never Used  . Alcohol Use: No    OB History    Grav Para Term Preterm Abortions TAB SAB Ect Mult Living                   Review of Systems  All other systems reviewed and are negative.    Allergies  Red dye and Tuna  Home Medications   Current Outpatient Rx  Name  Route  Sig  Dispense  Refill  . ALBUTEROL SULFATE HFA 108 (90 BASE) MCG/ACT IN AERS   Inhalation   Inhale 2 puffs into the lungs every 6 (six) hours as needed for wheezing.   1 Inhaler   0   . ATORVASTATIN CALCIUM 40 MG PO TABS   Oral   Take 1 tablet (40 mg total) by mouth daily.   90 tablet   3   . BENAZEPRIL HCL 20 MG PO TABS   Oral   Take 1 tablet (20 mg total) by mouth daily.   30 tablet   0   . ONETOUCH ULTRA SYSTEM W/DEVICE KIT   Does not apply   1 kit by Does not apply route once.           . BUDESONIDE-FORMOTEROL FUMARATE 160-4.5 MCG/ACT IN AERO   Inhalation   Inhale 2 puffs into the lungs 2 (two) times daily.   1 Inhaler   3   . FUTURO FIRM COMPRESSION HOSE MISC      Use as directed   1 each   0   .  ESOMEPRAZOLE MAGNESIUM 40 MG PO CPDR   Oral   Take 1 capsule (40 mg total) by mouth daily before breakfast.   90 capsule   3   . FLUTICASONE PROPIONATE 50 MCG/ACT NA SUSP   Nasal   Place 2 sprays into the nose daily.   16 g   2   . FUROSEMIDE 40 MG PO TABS   Oral   Take 1 tablet (40 mg total) by mouth daily.   90 tablet   1   . GLUCOSE BLOOD VI STRP      Check Blood sugars twice a day   300 each   3     Dx: 250.00   . GUAIFENESIN-CODEINE 100-10 MG/5ML PO SYRP      1-2 tsp po qhs prn cough   120 mL   0   . INSULIN PEN NEEDLE 32G X 6 MM MISC   Subcutaneous   Inject 1.8 mg into the skin daily.   100 each   3   . LIRAGLUTIDE 18 MG/3ML [place] SOLN   Subcutaneous   Inject 0.3 mLs (1.8 mg total) into the skin daily.   27 mL   3   . LORATADINE 10 MG PO TABS      TAKE 1 TABLET BY MOUTH EVERY DAY   30 tablet   11   . PREDNISONE (PAK) 10 MG PO TABS      Take as directed.  Dispense 6 day pack   1 tablet   0   . PROMETHAZINE HCL 25 MG RE SUPP   Rectal   Place 25 mg rectally as  needed.            BP 163/85  Pulse 75  Temp 98 F (36.7 C) (Oral)  Resp 20  Ht 5\' 5"  (1.651 m)  Wt 350 lb (158.759 kg)  BMI 58.24 kg/m2  SpO2 99%  Physical Exam  Nursing note and vitals reviewed. Constitutional: She is oriented to person, place, and time. She appears well-developed and well-nourished. No distress.  HENT:  Head: Normocephalic and atraumatic.       No cerumen impaction.  Nml L and R TM.  Eyes:       Normal appearance  Neck: Normal range of motion.  Cardiovascular: Normal rate, regular rhythm and intact distal pulses.   Pulmonary/Chest: Effort normal and breath sounds normal. No respiratory distress.  Musculoskeletal: Normal range of motion.  Neurological: She is alert and oriented to person, place, and time. No sensory deficit. Coordination normal.       CN 3-12 intact.  Slight nystagmus w/ R lateral gaze. 5/5 and equal upper and lower extremity strength.  No past pointing.   Pt becomes dizzy for approx 25 seconds when changes from sitting to supine position w/ head turned to right and experiences very mild and brief dizziness when she rotates her head to the L.  No nystagmus w/ position change.  Upon sitting patient back up in bed, she begins to vomit.    Skin: Skin is warm and dry. No rash noted.  Psychiatric: She has a normal mood and affect. Her behavior is normal.    ED Course  Procedures (including critical care time)   Date: 03/27/2012  Rate: 52  Rhythm: sinus bradycardia  QRS Axis: normal  Intervals: normal  ST/T Wave abnormalities: normal  Conduction Disutrbances:none  Narrative Interpretation:   Old EKG Reviewed: none available   Labs Reviewed  CBC WITH DIFFERENTIAL - Abnormal; Notable for the  following:    WBC 3.6 (*)     All other components within normal limits  BASIC METABOLIC PANEL - Abnormal; Notable for the following:    Glucose, Bld 111 (*)     GFR calc non Af Amer 73 (*)     GFR calc Af Amer 84 (*)     All other components  within normal limits  URINALYSIS, ROUTINE W REFLEX MICROSCOPIC  TROPONIN I   Ct Head Wo Contrast  03/27/2012  *RADIOLOGY REPORT*  Clinical Data: Dizziness, headache, nausea and vomiting.  CT HEAD WITHOUT CONTRAST  Technique:  Contiguous axial images were obtained from the base of the skull through the vertex without contrast.  Comparison: None.  Findings: The brain demonstrates no evidence of hemorrhage, infarction, edema, mass effect, extra-axial fluid collection, hydrocephalus or mass lesion.  The skull is unremarkable.  IMPRESSION: Normal head CT.   Original Report Authenticated By: Irish Lack, M.D.      1. Vertigo       MDM  [id number] F w/ HTN, diabetes and hyperlipidemia presents w/ vertigo upon waking this am.  Associated w/ posterior headache, ataxia, vomiting and diaphoresis.  No recent head trauma or viral illness, no ear pain or auditory sx and no other neurologic sx.  Exam most consistent w/ BPPV, but CT head ordered to evaluate for posterior circulation stroke.  IV NS, ativan and zofran ordered for sx.  If patient continues to be symptomatic and ataxic following treatment, will MRI.  3:50 PM     Labs and head CT unremarkable.  Pt drowsy, likely a SE of ativan, but easily aroused.  6:01 PM   Patient reports that his symptoms are improved but upon standing, her dizziness returns.  She is not ataxic but has a positive romberg.  Will admit for further evaluation.  7:27 PM   [facility] for [facility].      Otilio Miu, PA 03/27/12 2048  Otilio Miu, PA 03/27/12 2102

## 2012-03-27 NOTE — ED Notes (Signed)
Pt states she was sent here from Urgent Care "because they couldn't get her blood". C/O dizziness, N/V, sweats since 5-6 a.m.

## 2012-03-27 NOTE — ED Notes (Signed)
Assigned to bed 3W04 @ [facility], RN notified,[facility] called for transport.

## 2012-03-28 DIAGNOSIS — K219 Gastro-esophageal reflux disease without esophagitis: Secondary | ICD-10-CM

## 2012-03-28 DIAGNOSIS — R55 Syncope and collapse: Secondary | ICD-10-CM

## 2012-03-28 DIAGNOSIS — R001 Bradycardia, unspecified: Secondary | ICD-10-CM | POA: Diagnosis present

## 2012-03-28 DIAGNOSIS — R42 Dizziness and giddiness: Secondary | ICD-10-CM

## 2012-03-28 LAB — LIPID PANEL
HDL: 53 mg/dL (ref >39)
LDL Cholesterol: 75 mg/dL (ref 0–99)
Total CHOL/HDL Ratio: 2.6 RATIO
Triglycerides: 45 mg/dL (ref <150)
VLDL: 9 mg/dL (ref 0–40)

## 2012-03-28 LAB — GLUCOSE, CAPILLARY
Glucose-Capillary: 98 mg/dL (ref 70–99)
Glucose-Capillary: 119 mg/dL — ABNORMAL HIGH (ref 70–99)

## 2012-03-28 LAB — HEMOGLOBIN A1C: Mean Plasma Glucose: 140 mg/dL — ABNORMAL HIGH (ref <117)

## 2012-03-28 MED ORDER — BENAZEPRIL HCL 20 MG PO TABS
20.0000 mg | ORAL_TABLET | Freq: Every day | ORAL | Status: DC
  Administered 2012-03-28 – 2012-03-29 (×2): 20 mg via ORAL
  Filled 2012-03-28 (×2): qty 1

## 2012-03-28 MED ORDER — BUDESONIDE-FORMOTEROL FUMARATE 160-4.5 MCG/ACT IN AERO
2.0000 | INHALATION_SPRAY | Freq: Two times a day (BID) | RESPIRATORY_TRACT | Status: DC
  Administered 2012-03-29: 2 via RESPIRATORY_TRACT
  Filled 2012-03-28: qty 6

## 2012-03-28 MED ORDER — ALBUTEROL SULFATE HFA 108 (90 BASE) MCG/ACT IN AERS
2.0000 | INHALATION_SPRAY | Freq: Four times a day (QID) | RESPIRATORY_TRACT | Status: DC | PRN
  Filled 2012-03-28: qty 6.7

## 2012-03-28 MED ORDER — FUROSEMIDE 40 MG PO TABS
40.0000 mg | ORAL_TABLET | Freq: Every day | ORAL | Status: DC
  Administered 2012-03-28: 40 mg via ORAL
  Filled 2012-03-28 (×2): qty 1

## 2012-03-28 MED ORDER — ATORVASTATIN CALCIUM 40 MG PO TABS
40.0000 mg | ORAL_TABLET | Freq: Every day | ORAL | Status: DC
  Administered 2012-03-28 – 2012-03-29 (×2): 40 mg via ORAL
  Filled 2012-03-28 (×2): qty 1

## 2012-03-28 MED ORDER — LIRAGLUTIDE 18 MG/3ML ~~LOC~~ SOLN: 1.8000 mg | Freq: Every day | SUBCUTANEOUS | Status: DC

## 2012-03-28 MED ORDER — LORATADINE 10 MG PO TABS
10.0000 mg | ORAL_TABLET | Freq: Every day | ORAL | Status: DC
  Administered 2012-03-28 – 2012-03-29 (×2): 10 mg via ORAL
  Filled 2012-03-28 (×2): qty 1

## 2012-03-28 MED ORDER — PANTOPRAZOLE SODIUM 40 MG PO TBEC
40.0000 mg | DELAYED_RELEASE_TABLET | Freq: Every day | ORAL | Status: DC
  Administered 2012-03-28 – 2012-03-29 (×2): 40 mg via ORAL
  Filled 2012-03-28 (×3): qty 1

## 2012-03-28 NOTE — Progress Notes (Signed)
  Echocardiogram 2D Echocardiogram has been performed.  Kori Goins 03/28/2012, 11:51 AM 

## 2012-03-28 NOTE — Progress Notes (Signed)
[facility]             Progress Note   Subjective: Dizziness has improved. Now has sinus pain and congestion.  Objective: Vital signs in last 24 hours: Temp:  [97.7 F (36.5 C)-98.1 F (36.7 C)] 98 F (36.7 C) (11/24 0753) Pulse Rate:  [67-83] 77  (11/24 0753) Resp:  [15-20] 18  (11/24 0753) BP: (130-163)/(65-89) 143/75 mmHg (11/24 0753) SpO2:  [97 %-100 %] 99 % (11/24 0753) Weight:  [157.9 kg (348 lb 1.7 oz)-158.759 kg (350 lb)] 157.9 kg (348 lb 1.7 oz) (11/23 2200) Weight change:  Last BM Date: 03/27/12  Intake/Output from previous day: 11/23 0701 - 11/24 0700 In: 240 [P.O.:240] Out: -      Physical Exam: General: Alert, awake, oriented x3, in no acute distress, morbidly obese. HEENT: No bruits, no goiter. Heart: Regular rate and rhythm, without murmurs, rubs, gallops. Lungs: Clear to auscultation bilaterally. Abdomen: Soft, nontender, nondistended, positive bowel sounds. Extremities: No clubbing cyanosis or edema with positive pedal pulses. Neuro: Grossly intact, nonfocal.    Lab Results: Basic Metabolic Panel:  Basename 03/27/12 1555  NA 139  K 3.6  CL 102  CO2 25  GLUCOSE 111*  BUN 11  CREATININE 0.90  CALCIUM 9.0  MG --  PHOS --   CBC:  Basename 03/27/12 1555  WBC 3.6*  NEUTROABS 2.6  HGB 13.4  HCT 39.3  MCV 84.7  PLT 282   Cardiac Enzymes:  Basename 03/27/12 1555  CKTOTAL --  CKMB --  CKMBINDEX --  TROPONINI <0.30   CBG:  Basename 03/28/12 0745 03/27/12 2107  GLUCAP 98 82   Fasting Lipid Panel:  Basename 03/28/12 0217  CHOL 137  HDL 53  LDLCALC 75  TRIG 45  CHOLHDL 2.6  LDLDIRECT --   Urinalysis:  Basename 03/27/12 1503  COLORURINE YELLOW  LABSPEC 1.022  PHURINE 7.0  GLUCOSEU NEGATIVE  HGBUR NEGATIVE  BILIRUBINUR NEGATIVE  KETONESUR NEGATIVE  PROTEINUR NEGATIVE  UROBILINOGEN 0.2  NITRITE NEGATIVE  LEUKOCYTESUR NEGATIVE    Studies/Results: Ct Head Wo Contrast  03/27/2012  *RADIOLOGY REPORT*   Clinical Data: Dizziness, headache, nausea and vomiting.  CT HEAD WITHOUT CONTRAST  Technique:  Contiguous axial images were obtained from the base of the skull through the vertex without contrast.  Comparison: None.  Findings: The brain demonstrates no evidence of hemorrhage, infarction, edema, mass effect, extra-axial fluid collection, hydrocephalus or mass lesion.  The skull is unremarkable.  IMPRESSION: Normal head CT.   Original Report Authenticated By: Irish Lack, M.D.     Medications: Scheduled Meds:   . [COMPLETED] sodium chloride   Intravenous Once  . ALPRAZolam  0.5 mg Oral Once  . [COMPLETED] aspirin  324 mg Oral Once  . aspirin  325 mg Oral Daily  . enoxaparin  40 mg Subcutaneous Q24H  . insulin aspart  0-5 Units Subcutaneous QHS  . insulin aspart  0-9 Units Subcutaneous TID WC  . [COMPLETED] LORazepam  1 mg Intravenous Once  . [COMPLETED] promethazine  25 mg Intravenous Once  . sodium chloride  3 mL Intravenous Q12H   Continuous Infusions:  PRN Meds:.sodium chloride, acetaminophen, HYDROmorphone (DILAUDID) injection, meclizine, oxyCODONE, sodium chloride  Assessment/Plan:  Principal Problem:  *Central positional vertigo Active Problems:  DIABETES MELLITUS, TYPE II  HYPERLIPIDEMIA  OBESITY  HYPERTENSION  Bradycardia, sinus   Vertigo -I have not been able to reproduce nystagmus, but apparently in the ED she had right (fast beat) nystagmus when head was positioned to the  left and a +Rhomberg sign. -Most likely represents BPPV vs Labyrinthitis as she currently has a sinus cold. -Agree posterior circulation CVA needs to be ruled out, especially given her risk factors, but MRI unable to be completed given patient's MO and claustrophobia. -We can repeat CT Head in 2 days which will definitely tell us if she has had a CVA (I do not think this is likely). -Have asked for vestibular training, continue meclizine. -Check orthostatics.   DM II -Well controlled. -Patient  says she checked her CBG at home when this happened and it was 128, ruling out hypoglycemia as an etiology for her dizziness. -Continue home dose liraglutide.  HTN -Well controlled. -Continue home meds.  DVT Prophylaxis -Lovenox.   Time spent coordinating care: 35 minutes.   LOS: 1 day   [facility] Pager: [phone] 03/28/2012, 9:30 AM

## 2012-03-28 NOTE — Progress Notes (Signed)
VASCULAR LAB PRELIMINARY  PRELIMINARY  PRELIMINARY  PRELIMINARY  Carotid Dopplers completed.    Preliminary report:  No ICA stenosis.  Vertebral artery flow is antegrade.  Tammy [name], 03/28/2012, 8:55 AM

## 2012-03-28 NOTE — ED Provider Notes (Signed)
Medical screening examination/treatment/procedure(s) were performed by non-physician practitioner and as supervising physician I was immediately available for consultation/collaboration.    Adda Stokes L Ambar Raphael, MD 03/28/12 0723 

## 2012-03-29 LAB — CBC
HCT: 37.3 % (ref 36.0–46.0)
MCH: 28.2 pg (ref 26.0–34.0)
MCV: 86.1 fL (ref 78.0–100.0)
RDW: 13.4 % (ref 11.5–15.5)
WBC: 4.2 10*3/uL (ref 4.0–10.5)

## 2012-03-29 LAB — GLUCOSE, CAPILLARY
Glucose-Capillary: 93 mg/dL (ref 70–99)
Glucose-Capillary: 106 mg/dL — ABNORMAL HIGH (ref 70–99)

## 2012-03-29 LAB — BASIC METABOLIC PANEL
CO2: 28 mEq/L (ref 19–32)
Calcium: 8.4 mg/dL (ref 8.4–10.5)
Chloride: 105 mEq/L (ref 96–112)
Creatinine, Ser: 0.95 mg/dL (ref 0.50–1.10)
Glucose, Bld: 102 mg/dL — ABNORMAL HIGH (ref 70–99)

## 2012-03-29 MED ORDER — POTASSIUM CHLORIDE CRYS ER 20 MEQ PO TBCR
40.0000 meq | EXTENDED_RELEASE_TABLET | Freq: Once | ORAL | Status: AC
  Administered 2012-03-29: 40 meq via ORAL
  Filled 2012-03-29: qty 2

## 2012-03-29 MED ORDER — MECLIZINE HCL 25 MG PO TABS: 25.0000 mg | ORAL_TABLET | Freq: Three times a day (TID) | ORAL | Status: DC | PRN

## 2012-03-29 NOTE — Evaluation (Signed)
Physical Therapy Evaluation Patient Details Name: Tammy [name] MRN: [id number] [name]: November 06, 1960 Today's Date: 03/29/2012 Time: 2536-6440 PT Time Calculation (min): 55 min  PT Assessment / Plan / Recommendation Clinical Impression  Completed a thorough vestibular assessment.  Answers to subjective questioning with resolution of most s/s led me to consider that pt had labrynthitis or viral infection.  Objective testing:  Occulomotor testing was negative with no nystagmus note and pursuits, saccades, VOR and head thrust negative.  Nothing led me to suspect BPPV and as expected, there was no s/s or nystagmus with Dix-Hallpike.  Gait with scanning and balance challenge produced no overt LOB, but pt still felt some heaviness in her head which kept her feeling more guarded than usual.  I feel pt is suffering from and infection or inflamation  which caused her symptoms on Saturday and are now going away.  No further PT needs at this time.  D/C    PT Assessment  Patent does not need any further PT services    Follow Up Recommendations  No PT follow up    Does the patient have the potential to tolerate intense rehabilitation      Barriers to Discharge        Equipment Recommendations  None recommended by PT    Recommendations for Other Services     Frequency      Precautions / Restrictions     Pertinent Vitals/Pain       Mobility  Bed Mobility Bed Mobility: Supine to Sit;Sitting - Scoot to Edge of Bed;Sit to Supine Supine to Sit: 7: Independent Sitting - Scoot to Edge of Bed: 7: Independent Sit to Supine: 7: Independent Transfers Transfers: Sit to Stand;Stand to Sit Sit to Stand: 7: Independent Stand to Sit: 7: Independent Ambulation/Gait Ambulation/Gait Assistance: 5: Supervision;Other (comment);7: Independent (independent in the room; Supervision during testing) Ambulation Distance (Feet): 300 Feet Assistive device: None Ambulation/Gait Assistance Details: No overt  unsteadiness with scanning R and L or up and down, but pt felt "off" looking down and would slow down perceptably when looking R Gait Pattern: Within Functional Limits Gait velocity: subjectively safe up to community level speeds Wheelchair Mobility Wheelchair Mobility: No    Shoulder Instructions     Exercises     PT Diagnosis:    PT Problem List:   PT Treatment Interventions:     PT Goals    Visit Information  Last PT Received On: 03/29/12 Assistance Needed: +1    Subjective Data  Subjective: Moving around doesn't make me close to as dizzy as Saturday when I came in Patient Stated Goal: Home I and back to work   Prior Functioning  Prior Function Level of Independence: Independent Able to Take Stairs?: Yes Driving: Yes Vocation: Full time employment Communication Communication: No difficulties Dominant Hand: Right    Cognition  Overall Cognitive Status: Appears within functional limits for tasks assessed/performed Arousal/Alertness: Awake/alert Orientation Level: Appears intact for tasks assessed Behavior During Session: [place] for tasks performed    Extremity/Trunk Assessment Right Upper Extremity Assessment RUE ROM/Strength/Tone: Within functional levels Left Upper Extremity Assessment LUE ROM/Strength/Tone: Within functional levels Right Lower Extremity Assessment RLE ROM/Strength/Tone: Within functional levels Left Lower Extremity Assessment LLE ROM/Strength/Tone: Within functional levels Trunk Assessment Trunk Assessment: Normal   Balance Balance Balance Assessed: Yes Static Standing Balance Static Standing - Balance Support: During functional activity;No upper extremity supported Static Standing - Level of Assistance: 5: Stand by assistance Rhomberg - Eyes Opened:  (tended to list posteriorly when  looking up) Rhomberg - Eyes Closed: 30  (normal sway, no LOB) Standardized Balance Assessment Standardized Balance Assessment: Vestibular Evaluation  End of  Session PT - End of Session Activity Tolerance: Patient tolerated treatment well Patient left: in bed;with call bell/phone within reach Nurse Communication: Mobility status  GP Functional Assessment Tool Used: clinical judgement Functional Limitation: Mobility: Walking and moving around Mobility: Walking and Moving Around Current Status (Z6109): 0 percent impaired, limited or restricted Mobility: Walking and Moving Around Goal Status (U0454): 0 percent impaired, limited or restricted Mobility: Walking and Moving Around Discharge Status [id number]): 0 percent impaired, limited or restricted   Jasmina Gendron, Eliseo Gum 03/29/2012, 1:58 PM  03/29/2012  [place] Bing, PT [phone] (pager)

## 2012-03-29 NOTE — Discharge Summary (Signed)
Physician Discharge Summary  Patient ID: Tammy [name] MRN: [id number] [name]/AGE: [birth year] 51 y.o.  Admit date: 03/27/2012 Discharge date: 03/29/2012  Primary Care Physician:  Loreen Freud, DO   Discharge Diagnoses:    Principal Problem:  *Central positional vertigo Active Problems:  DIABETES MELLITUS, TYPE II  HYPERLIPIDEMIA  OBESITY  HYPERTENSION  Bradycardia, sinus      Medication List     As of 03/29/2012  2:03 PM    STOP taking these medications         glucose blood test strip      ONE TOUCH ULTRA SYSTEM KIT W/DEVICE Kit      TAKE these medications         albuterol 108 (90 BASE) MCG/ACT inhaler   Commonly known as: PROVENTIL HFA;VENTOLIN HFA   Inhale 2 puffs into the lungs every 6 (six) hours as needed for wheezing.      atorvastatin 40 MG tablet   Commonly known as: LIPITOR   Take 1 tablet (40 mg total) by mouth daily.      benazepril 20 MG tablet   Commonly known as: LOTENSIN   Take 1 tablet (20 mg total) by mouth daily.      budesonide-formoterol 160-4.5 MCG/ACT inhaler   Commonly known as: SYMBICORT   Inhale 2 puffs into the lungs 2 (two) times daily.      esomeprazole 40 MG capsule   Commonly known as: NEXIUM   Take 1 capsule (40 mg total) by mouth daily before breakfast.      furosemide 40 MG tablet   Commonly known as: LASIX   Take 1 tablet (40 mg total) by mouth daily.      Insulin Pen Needle 32G X 6 MM Misc   Inject 1.8 mg into the skin daily.      Liraglutide 18 MG/3ML Soln   Inject 0.3 mLs (1.8 mg total) into the skin daily.      loratadine 10 MG tablet   Commonly known as: CLARITIN   TAKE 1 TABLET BY MOUTH EVERY DAY      meclizine 25 MG tablet   Commonly known as: ANTIVERT   Take 1 tablet (25 mg total) by mouth 3 (three) times daily as needed for dizziness or nausea.         Disposition and Follow-up:  Will be discharged home today in stable condition. Will need to follow up with her PCP in 2 weeks following  DC.  Consults:  None.   Significant Diagnostic Studies:  Ct Head Wo Contrast  03/27/2012  *RADIOLOGY REPORT*  Clinical Data: Dizziness, headache, nausea and vomiting.  CT HEAD WITHOUT CONTRAST  Technique:  Contiguous axial images were obtained from the base of the skull through the vertex without contrast.  Comparison: None.  Findings: The brain demonstrates no evidence of hemorrhage, infarction, edema, mass effect, extra-axial fluid collection, hydrocephalus or mass lesion.  The skull is unremarkable.  IMPRESSION: Normal head CT.   Original Report Authenticated By: Irish Lack, M.D.     Brief H and P: For complete details please refer to admission H and P, but in brief patient is a 51 y.o. female who presented to the [facility] ED with complaints of severe posterior headache and dizziness upon awakening. She reports feeling as if the room was spinning, and she has nausea and diaphoresis associated with the dizziness. She reports the dizziness is worse with turning her head and standing up. She reports having intermittent episodes of dizziness, and she denies  having fevers, chills, chest pain, SOB, or syncope. She had some relief after she had been given IV Ativan in the ED. She was described as ataxic, and had a +Rhomberg sign on physical examination. A CT scan was performed and was found to be negative for acute findings, and then, she was transferred to [facility] where we were asked to admit her for further evaluation and management.    Hospital Course:  Principal Problem:  *Central positional vertigo Active Problems:  DIABETES MELLITUS, TYPE II  HYPERLIPIDEMIA  OBESITY  HYPERTENSION  Bradycardia, sinus   Vertigo -Has resolved at this point. -I suspect related to labyrinthitis given her ongoing URI symptoms. -BPPV less likely as this was not evident on vestibular testing. -Doubt posterior CVA as she has not had any recurrence nor does she exhibit any focal neurologic signs. Initial CT  Head was negative and she is too large for an MRI. -Not orthostatic. -Treat URI symptomatically.  DM II -Well controlled. -Continue home meds.  HTN -Well controlled. -Continue home meds.  Time spent on Discharge: Greater than 30 minutes.  SignedChaya Jan [facility] Pager: [phone] 03/29/2012, 2:03 PM

## 2012-03-29 NOTE — Progress Notes (Signed)
Pt ready for d/c. Went over discharge instructions with patient, instructed to start taking meclizine as needed for nausea and dizziness. Instructed to make a follow up appointment with Dr. Laury Axon in one week. Removed from telemetry, pt in SR. D/c'd PIV, WNL. Pt d/c'd to home with husband.

## 2012-03-29 NOTE — Progress Notes (Signed)
Patient wanted to sit after this was done. "After 3 minutes" not completed.

## 2012-03-30 ENCOUNTER — Other Ambulatory Visit: Payer: Self-pay | Admitting: Family Medicine

## 2012-03-30 ENCOUNTER — Telehealth: Payer: Self-pay

## 2012-03-30 DIAGNOSIS — J069 Acute upper respiratory infection, unspecified: Secondary | ICD-10-CM

## 2012-03-30 MED ORDER — FLUTICASONE PROPIONATE 50 MCG/ACT NA SUSP: 2.0000 | Freq: Every day | NASAL | Status: DC

## 2012-03-30 NOTE — Telephone Encounter (Signed)
Call from patient who stated she went to the ED and was told to use Tylenol Sinus but she can not get the Rx without a prescription. She would like to have the Rx called to [facility] on [address].   Please advise    KP

## 2012-03-30 NOTE — Telephone Encounter (Signed)
Discussed with patient and she voiced understanding.  She already picked up the nasal spray   KP

## 2012-03-30 NOTE — Telephone Encounter (Signed)
I have tried to contact the patient but none of the numbers are working. I called the work phone and the patient does not have a direct extension. Unable to leave a message     KP

## 2012-03-30 NOTE — Telephone Encounter (Signed)
She should not use tylenol sinus because of her bp--   I will send nose spray to pharmacy and she should take it with an antihistamine ex--allegra , zyrtec, claritn    ( just the plain ones , not the "D")  ---if that is not working she needs an Chief Financial Officer

## 2012-04-26 ENCOUNTER — Other Ambulatory Visit: Payer: Self-pay | Admitting: *Deleted

## 2012-04-26 NOTE — Telephone Encounter (Signed)
Pt left VM that she needs to get Rx sent in to pharmacy for her test strips. .Left message to call office to find out which test strip Pt is using.

## 2012-04-27 MED ORDER — GLUCOSE BLOOD VI STRP: ORAL_STRIP | Status: DC

## 2012-04-27 NOTE — Telephone Encounter (Signed)
Pt return call,Rx sent Pt aware.

## 2012-05-04 ENCOUNTER — Encounter: Payer: Self-pay | Admitting: Family Medicine

## 2012-05-04 ENCOUNTER — Ambulatory Visit (INDEPENDENT_AMBULATORY_CARE_PROVIDER_SITE_OTHER): Payer: BC Managed Care – PPO | Admitting: Family Medicine

## 2012-05-04 VITALS — BP 140/84 | HR 96 | Temp 101.5°F | Wt 349.6 lb

## 2012-05-04 DIAGNOSIS — R509 Fever, unspecified: Secondary | ICD-10-CM

## 2012-05-04 DIAGNOSIS — J111 Influenza due to unidentified influenza virus with other respiratory manifestations: Secondary | ICD-10-CM

## 2012-05-04 DIAGNOSIS — J101 Influenza due to other identified influenza virus with other respiratory manifestations: Secondary | ICD-10-CM

## 2012-05-04 MED ORDER — GUAIFENESIN-CODEINE 100-10 MG/5ML PO SYRP: 5.0000 mL | ORAL_SOLUTION | Freq: Three times a day (TID) | ORAL | Status: DC | PRN

## 2012-05-04 MED ORDER — OSELTAMIVIR PHOSPHATE 75 MG PO CAPS: 75.0000 mg | ORAL_CAPSULE | Freq: Two times a day (BID) | ORAL | Status: DC

## 2012-05-04 NOTE — Patient Instructions (Signed)
Influenza Facts  Flu (influenza) is a contagious respiratory illness caused by the influenza viruses. It can cause mild to severe illness. While most healthy people recover from the flu without specific treatment and without complications, older people, young children, and people with certain health conditions are at higher risk for serious complications from the flu, including death.  CAUSES    The flu virus is spread from person to person by respiratory droplets from coughing and sneezing.   A person can also become infected by touching an object or surface with a virus on it and then touching their mouth, eye or nose.   Adults may be able to infect others from 1 day before symptoms occur and up to 7 days after getting sick. So it is possible to give someone the flu even before you know you are sick and continue to infect others while you are sick.  SYMPTOMS    Fever (usually high).   Headache.   Tiredness (can be extreme).   Cough.   Sore throat.   Runny or stuffy nose.   Body aches.   Diarrhea and vomiting may also occur, particularly in children.   These symptoms are referred to as "flu-like symptoms". A lot of different illnesses, including the common cold, can have similar symptoms.  DIAGNOSIS    There are tests that can determine if you have the flu as long you are tested within the first 2 or 3 days of illness.   A doctor's exam and additional tests may be needed to identify if you have a disease that is a complicating the flu.  RISKS AND COMPLICATIONS   Some of the complications caused by the flu include:   Bacterial pneumonia or progressive pneumonia caused by the flu virus.   Loss of body fluids (dehydration).   Worsening of chronic medical conditions, such as heart failure, asthma, or diabetes.   Sinus problems and ear infections.  HOME CARE INSTRUCTIONS    Seek medical care early on.   If you are at high risk from complications of the flu, consult your health-care provider as soon  as you develop flu-like symptoms. Those at high risk for complications include:   People 65 years or older.   People with chronic medical conditions, including diabetes.   Pregnant women.   Young children.   Your caregiver may recommend use of an antiviral medication to help treat the flu.   If you get the flu, get plenty of rest, drink a lot of liquids, and avoid using alcohol and tobacco.   You can take over-the-counter medications to relieve the symptoms of the flu if your caregiver approves. (Never give aspirin to children or teenagers who have flu-like symptoms, particularly fever).  PREVENTION   The single best way to prevent the flu is to get a flu vaccine each fall. Other measures that can help protect against the flu are:   Antiviral Medications   A number of antiviral drugs are approved for use in preventing the flu. These are prescription medications, and a doctor should be consulted before they are used.   Habits for Good Health   Cover your nose and mouth with a tissue when you cough or sneeze, throw the tissue away after you use it.   Wash your hands often with soap and water, especially after you cough or sneeze. If you are not near water, use an alcohol-based hand cleaner.   Avoid people who are sick.   If you get the   flu, stay home from work or school. Avoid contact with other people so that you do not make them sick, too.   Try not to touch your eyes, nose, or mouth as germs ore often spread this way.  IN CHILDREN, EMERGENCY WARNING SIGNS THAT NEED URGENT MEDICAL ATTENTION:   Fast breathing or trouble breathing.   Bluish skin color.   Not drinking enough fluids.   Not waking up or not interacting.   Being so irritable that the child does not want to be held.   Flu-like symptoms improve but then return with fever and worse cough.   Fever with a rash.  IN ADULTS, EMERGENCY WARNING SIGNS THAT NEED URGENT MEDICAL ATTENTION:   Difficulty breathing or shortness of breath.   Pain  or pressure in the chest or abdomen.   Sudden dizziness.   Confusion.   Severe or persistent vomiting.  SEEK IMMEDIATE MEDICAL CARE IF:   You or someone you know is experiencing any of the symptoms above. When you arrive at the emergency center,report that you think you have the flu. You may be asked to wear a mask and/or sit in a secluded area to protect others from getting sick.  MAKE SURE YOU:    Understand these instructions.   Monitor your condition.   Seek medical care if you are getting worse, or not improving.  Document Released: 04/24/2003 Document Revised: 07/14/2011 Document Reviewed: 01/18/2009  [employer] Patient Information 2013 [employer].

## 2012-05-04 NOTE — Progress Notes (Signed)
  Subjective:     Tammy [name] is a 51 y.o. female who presents for evaluation of fever. She has had the fever for 2 days. Symptoms have been gradually worsening. Symptoms are described as fevers up to 102 degrees and chills, and are worse any time. Associated symptoms are body aches, chills and fatigue. Patient denies abdominal pain, diarrhea, headache, nausea, otitis symptoms, URI symptoms, urinary tract symptoms and vomiting.  She has tried to alleviate the symptoms with acetaminophen with no relief. The patient has no known comorbidities (structural heart/valvular disease, prosthetic joints, immunocompromised state, recent dental work, known abscesses).  The following portions of the patient's history were reviewed and updated as appropriate: allergies, current medications, past family history, past medical history, past social history, past surgical history and problem list.  Review of Systems Pertinent items are noted in HPI.   Objective:    BP 140/84  Pulse 96  Temp 101.5 F (38.6 C) (Oral)  Wt 349 lb 9.6 oz (158.578 kg)  SpO2 95% General appearance: alert, cooperative, appears stated age and no distress Ears: normal TM's and external ear canals both ears Nose: Nares normal. Septum midline. Mucosa normal. No drainage or sinus tenderness. Throat: lips, mucosa, and tongue normal; teeth and gums normal Neck: no adenopathy, no carotid bruit, no JVD, supple, symmetrical, trachea midline and thyroid not enlarged, symmetric, no tenderness/mass/nodules Lungs: clear to auscultation bilaterally Heart: S1, S2 normal   Assessment:    Fever is likely secondary to influenza A.   Plan:    Supportive care with appropriate antipyretics and fluids. f/u prn  tamiflu for 5 days Cough syrup

## 2012-05-07 ENCOUNTER — Telehealth: Payer: Self-pay | Admitting: Family Medicine

## 2012-05-07 NOTE — Telephone Encounter (Signed)
Detailed message left for the patient along with the direction to call back.        KP

## 2012-05-07 NOTE — Telephone Encounter (Signed)
Discussed with patient she voiced understanding, she is urinating ok but not as much, iIstressed increasing fluids and keeping an eye on symptoms and if she gets worst go to the ED for IV. She voiced understanding and agreed to do so. She also agreed to follow up on Monday if needed.      KP

## 2012-05-07 NOTE — Telephone Encounter (Signed)
We can see Monday if she needs [name] to but really encourage her to drink more fluids---gatorade.  Is she urinating ?  __-if too dehydrated she may need ER for IV.

## 2012-05-07 NOTE — Telephone Encounter (Signed)
Patient Information:  Caller Name: Isidora  Phone: [phone]  Patient: Tammy [name], Tammy [name]  Gender: Female  [name]: July 17, 1960  Age: 52 Years  PCP: Lelon Perla.  Pregnant: No  Office Follow Up:  Does the office need to follow up with this patient?: Yes  Instructions For The Office: Patient weak. +Flu test. Decreased fluids and food. Encouraged home care increased in water/gatoriade and food. She will call back for worsening , changes or concerns.  RN Note:  Discussed the importance of eating and drinking. Patient weight is 338 lbs. 16oz of water daily is not enough. Encouraged her to increased fluids and food. Closely mointor her s/sx and call back for any quesitons, changes or concerns. Home care instructions and call back guidelines reviewed.  Symptoms  Reason For Call & Symptoms: Patient states she is concerned about shaking  weakness in her legs.  She states the shaking will last a minute and occurs only in her legs.  She was seen in office on 05/04/12 and diagnosed with Flu+. She was placed on Tamiflu. No Fever. Cough productive clear, no runny,   She is drinking 16 oz water and ginger ale/gatoraide.  She states her appetite has been poor. Diet today- Bowel of grits, toast.  Reviewed Health History In EMR: Yes  Reviewed Medications In EMR: Yes  Reviewed Allergies In EMR: Yes  Reviewed Surgeries / Procedures: No  Date of Onset of Symptoms: 05/06/2012  Treatments Tried: Tamiflu  Treatments Tried Worked: No OB / GYN:  LMP: Unknown  Guideline(s) Used:  Influenza Follow-Up Call  Disposition Per Guideline:   Home Care  Reason For Disposition Reached:   Influenza (diagnosed by HCP) and no complications  Advice Given:  Reassurance  Influenza is commonly known as the "flu".  Influenza is a respiratory illness that is easily spread from person-to-person. The most common symptoms are the sudden onset of fever, muscle aches, cough, runny nose, sore throat, fatigue and headache. For  healthy people, the symptoms of influenza are similar to those of the common cold. However, with influenza, the onset is more abrupt and fever is higher. Feeling very sick for the first 3 days is common.  The treatment of influenza depends on your main symptoms. It is usually no different from that used for other viral respiratory infections. Most people who get sick with influenza get better at home without special treatment.  There are things that you can do to feel better and decrease your symptoms.  Here is some care advice that should help.  Influenza - Symptoms:  Symptoms of influenza are similar to a common cold: runny nose, sore throat, and a bad cough.  However, the fever is usually higher (102 - 104 F; 38.9 - 40 C) with influenza than with a cold. Headaches and muscle aches are also worse with Influenza.  The symptoms often come on suddenly. One day a person can feel fine and the next day feel miserable.  Influenza - Treating the Symptoms:  Cough: Use cough drops.  Feeling dehydrated: Drink extra liquids. If the air in your home is dry, use a humidifier.  Fever: For fever over 101 F (38.3 C), take acetaminophen every 4-6 hours (Adults 650 mg) OR ibuprofen every 6-8 hours (Adults 400-600 mg).  Muscle aches, headache, and other pains: Often this comes and goes with the fever. Take acetaminophen every 4-6 hours (Adults 650 mg) OR ibuprofen every 6-8 hours (Adults 400-600 mg).  Sore throat: Try throat lozenges, hard candy or warm  chicken broth.  Call Back If  Difficulty breathing  Fever lasts more than 3 days  Runny nose lasts more than 14 days  Cough lasts more than 3 weeks  You become worse.  Expected Course  : The fever lasts 2-3 days, the runny nose 5-10 days, and the cough 2-3 weeks.

## 2012-05-18 ENCOUNTER — Ambulatory Visit: Payer: BC Managed Care – PPO | Admitting: Dietician

## 2012-06-09 ENCOUNTER — Ambulatory Visit (INDEPENDENT_AMBULATORY_CARE_PROVIDER_SITE_OTHER): Payer: BC Managed Care – PPO | Admitting: Family Medicine

## 2012-06-09 ENCOUNTER — Encounter: Payer: Self-pay | Admitting: Family Medicine

## 2012-06-09 ENCOUNTER — Other Ambulatory Visit (INDEPENDENT_AMBULATORY_CARE_PROVIDER_SITE_OTHER): Payer: BC Managed Care – PPO

## 2012-06-09 VITALS — BP 142/86 | HR 68 | Temp 98.4°F | Wt 353.0 lb

## 2012-06-09 DIAGNOSIS — L0390 Cellulitis, unspecified: Secondary | ICD-10-CM

## 2012-06-09 DIAGNOSIS — F411 Generalized anxiety disorder: Secondary | ICD-10-CM

## 2012-06-09 DIAGNOSIS — R109 Unspecified abdominal pain: Secondary | ICD-10-CM

## 2012-06-09 DIAGNOSIS — L0291 Cutaneous abscess, unspecified: Secondary | ICD-10-CM

## 2012-06-09 DIAGNOSIS — F419 Anxiety disorder, unspecified: Secondary | ICD-10-CM

## 2012-06-09 LAB — BASIC METABOLIC PANEL
BUN: 15 mg/dL (ref 6–23)
GFR: 64.54 mL/min (ref >60.00)
Glucose, Bld: 92 mg/dL (ref 70–99)
Potassium: 4.2 mEq/L (ref 3.5–5.1)

## 2012-06-09 LAB — CBC WITH DIFFERENTIAL/PLATELET
Basophils Absolute: 0 10*3/uL (ref 0.0–0.1)
Eosinophils Absolute: 0.1 10*3/uL (ref 0.0–0.7)
Lymphocytes Relative: 31.4 % (ref 12.0–46.0)
MCHC: 33.2 g/dL (ref 30.0–36.0)
Neutrophils Relative %: 60 % (ref 43.0–77.0)
RBC: 4.2 Mil/uL (ref 3.87–5.11)
RDW: 14.8 % — ABNORMAL HIGH (ref 11.5–14.6)

## 2012-06-09 LAB — HEPATIC FUNCTION PANEL
ALT: 22 U/L (ref 0–35)
Bilirubin, Direct: 0.2 mg/dL (ref 0.0–0.3)
Total Protein: 6.9 g/dL (ref 6.0–8.3)

## 2012-06-09 MED ORDER — FUTURO SHEER SUPPORT HOSE MISC: Status: DC

## 2012-06-09 MED ORDER — SULFAMETHOXAZOLE-TMP DS 800-160 MG PO TABS: 1.0000 | ORAL_TABLET | Freq: Two times a day (BID) | ORAL | Status: DC

## 2012-06-09 MED ORDER — ALPRAZOLAM 0.5 MG PO TABS: ORAL_TABLET | ORAL | Status: DC

## 2012-06-09 NOTE — Progress Notes (Signed)
  Subjective:     Tammy [name] is a 52 y.o. female who presents for evaluation of abdominal pain. Onset was several days ago. Symptoms have been gradually worsening. The pain is described as sharp, and is 6/10 in intensity. Pain is located in the periumbilical region without radiation.  Aggravating factors: activity and eating.  Alleviating factors: none. Associated symptoms: none. The patient denies anorexia, arthralagias, belching, chills, constipation, diarrhea, dysuria, fever, flatus, frequency, headache, hematochezia, hematuria, melena, myalgias, nausea, sweats and vomiting.  The patient's history has been marked as reviewed and updated as appropriate.  Review of Systems Pertinent items are noted in HPI.     Objective:    BP 142/86  Pulse 68  Temp 98.4 F (36.9 C) (Oral)  Wt 353 lb (160.12 kg)  SpO2 94% General appearance: alert, cooperative, appears stated age and mild distress Abdomen: obese,  + abscess low abd, with min tenderness,  + tenderness mid abd with guarding  Skin: Skin color, texture, turgor normal. No rashes or lesions or see above    Assessment:    Abdominal pain, .   abscess--  abx per orders, ointment and bandage in place Plan:    The diagnosis was discussed with the patient and evaluation and treatment plans outlined. See orders for lab and imaging studies. Further follow-up plans will be based on outcome of lab/imaging studies; see orders.  rto prn

## 2012-06-09 NOTE — Patient Instructions (Addendum)
Abdominal Pain  Abdominal pain can be caused by many things. Your caregiver decides the seriousness of your pain by an examination and possibly blood tests and X-rays. Many cases can be observed and treated at home. Most abdominal pain is not caused by a disease and will probably improve without treatment. However, in many cases, more time must pass before a clear cause of the pain can be found. Before that point, it may not be known if you need more testing, or if hospitalization or surgery is needed.  HOME CARE INSTRUCTIONS   · Do not take laxatives unless directed by your caregiver.  · Take pain medicine only as directed by your caregiver.  · Only take over-the-counter or prescription medicines for pain, discomfort, or fever as directed by your caregiver.  · Try a clear liquid diet (broth, tea, or water) for as long as directed by your caregiver. Slowly move to a bland diet as tolerated.  SEEK IMMEDIATE MEDICAL CARE IF:   · The pain does not go away.  · You have a fever.  · You keep throwing up (vomiting).  · The pain is felt only in portions of the abdomen. Pain in the right side could possibly be appendicitis. In an adult, pain in the left lower portion of the abdomen could be colitis or diverticulitis.  · You pass bloody or black tarry stools.  MAKE SURE YOU:   · Understand these instructions.  · Will watch your condition.  · Will get help right away if you are not doing well or get worse.  Document Released: 01/29/2005 Document Revised: 07/14/2011 Document Reviewed: 12/08/2007  [employer]® Patient Information ©2013 [employer].

## 2012-06-10 ENCOUNTER — Observation Stay (HOSPITAL_COMMUNITY): Payer: BC Managed Care – PPO | Admitting: Anesthesiology

## 2012-06-10 ENCOUNTER — Other Ambulatory Visit: Payer: Self-pay

## 2012-06-10 ENCOUNTER — Encounter (HOSPITAL_COMMUNITY)
Admission: EM | Disposition: A | Discharge status: Home / Self Care | Payer: Self-pay | Source: Home / Self Care | Attending: Emergency Medicine

## 2012-06-10 ENCOUNTER — Encounter (HOSPITAL_COMMUNITY): Payer: Self-pay | Admitting: Anesthesiology

## 2012-06-10 ENCOUNTER — Observation Stay (HOSPITAL_BASED_OUTPATIENT_CLINIC_OR_DEPARTMENT_OTHER)
Admission: EM | Admit: 2012-06-10 | Discharge: 2012-06-12 | Disposition: A | Discharge status: Home / Self Care | Payer: BC Managed Care – PPO | Attending: General Surgery | Admitting: General Surgery

## 2012-06-10 ENCOUNTER — Emergency Department (HOSPITAL_BASED_OUTPATIENT_CLINIC_OR_DEPARTMENT_OTHER): Payer: BC Managed Care – PPO

## 2012-06-10 ENCOUNTER — Encounter (HOSPITAL_BASED_OUTPATIENT_CLINIC_OR_DEPARTMENT_OTHER): Payer: Self-pay | Admitting: *Deleted

## 2012-06-10 ENCOUNTER — Other Ambulatory Visit: Payer: Self-pay | Admitting: Family Medicine

## 2012-06-10 ENCOUNTER — Ambulatory Visit (HOSPITAL_BASED_OUTPATIENT_CLINIC_OR_DEPARTMENT_OTHER)
Admission: RE | Admit: 2012-06-10 | Discharge: 2012-06-10 | Disposition: A | Discharge status: Home / Self Care | Payer: BC Managed Care – PPO | Source: Ambulatory Visit | Attending: Family Medicine | Admitting: Family Medicine

## 2012-06-10 ENCOUNTER — Encounter (HOSPITAL_BASED_OUTPATIENT_CLINIC_OR_DEPARTMENT_OTHER): Payer: Self-pay

## 2012-06-10 DIAGNOSIS — IMO0002 Reserved for concepts with insufficient information to code with codable children: Secondary | ICD-10-CM | POA: Diagnosis present

## 2012-06-10 DIAGNOSIS — K3580 Unspecified acute appendicitis: Principal | ICD-10-CM | POA: Insufficient documentation

## 2012-06-10 DIAGNOSIS — M545 Low back pain, unspecified: Secondary | ICD-10-CM | POA: Insufficient documentation

## 2012-06-10 DIAGNOSIS — K219 Gastro-esophageal reflux disease without esophagitis: Secondary | ICD-10-CM | POA: Diagnosis present

## 2012-06-10 DIAGNOSIS — E785 Hyperlipidemia, unspecified: Secondary | ICD-10-CM

## 2012-06-10 DIAGNOSIS — E1151 Type 2 diabetes mellitus with diabetic peripheral angiopathy without gangrene: Secondary | ICD-10-CM | POA: Diagnosis present

## 2012-06-10 DIAGNOSIS — A499 Bacterial infection, unspecified: Secondary | ICD-10-CM | POA: Insufficient documentation

## 2012-06-10 DIAGNOSIS — N83209 Unspecified ovarian cyst, unspecified side: Secondary | ICD-10-CM

## 2012-06-10 DIAGNOSIS — E119 Type 2 diabetes mellitus without complications: Secondary | ICD-10-CM | POA: Insufficient documentation

## 2012-06-10 DIAGNOSIS — R109 Unspecified abdominal pain: Secondary | ICD-10-CM

## 2012-06-10 DIAGNOSIS — I10 Essential (primary) hypertension: Secondary | ICD-10-CM | POA: Diagnosis present

## 2012-06-10 DIAGNOSIS — K37 Unspecified appendicitis: Secondary | ICD-10-CM

## 2012-06-10 DIAGNOSIS — J40 Bronchitis, not specified as acute or chronic: Secondary | ICD-10-CM

## 2012-06-10 DIAGNOSIS — R001 Bradycardia, unspecified: Secondary | ICD-10-CM | POA: Diagnosis present

## 2012-06-10 DIAGNOSIS — Z860100 Personal history of colon polyps, unspecified: Secondary | ICD-10-CM

## 2012-06-10 DIAGNOSIS — K36 Other appendicitis: Secondary | ICD-10-CM | POA: Diagnosis present

## 2012-06-10 DIAGNOSIS — N760 Acute vaginitis: Secondary | ICD-10-CM | POA: Diagnosis present

## 2012-06-10 DIAGNOSIS — Z90710 Acquired absence of both cervix and uterus: Secondary | ICD-10-CM | POA: Insufficient documentation

## 2012-06-10 DIAGNOSIS — R609 Edema, unspecified: Secondary | ICD-10-CM | POA: Diagnosis present

## 2012-06-10 DIAGNOSIS — B9689 Other specified bacterial agents as the cause of diseases classified elsewhere: Secondary | ICD-10-CM | POA: Diagnosis present

## 2012-06-10 HISTORY — PX: LAPAROSCOPIC APPENDECTOMY: SHX408

## 2012-06-10 HISTORY — DX: Other appendicitis: K36

## 2012-06-10 LAB — CBC WITH DIFFERENTIAL/PLATELET
Basophils Absolute: 0 10*3/uL (ref 0.0–0.1)
Lymphocytes Relative: 35 % (ref 12–46)
Neutro Abs: 2.1 10*3/uL (ref 1.7–7.7)
Platelets: 274 10*3/uL (ref 150–400)
RDW: 13.5 % (ref 11.5–15.5)
WBC: 3.7 10*3/uL — ABNORMAL LOW (ref 4.0–10.5)

## 2012-06-10 LAB — COMPREHENSIVE METABOLIC PANEL
ALT: 19 U/L (ref 0–35)
AST: 20 U/L (ref 0–37)
CO2: 27 mEq/L (ref 19–32)
Chloride: 102 mEq/L (ref 96–112)
GFR calc non Af Amer: 73 mL/min — ABNORMAL LOW (ref >90)
Potassium: 3.8 mEq/L (ref 3.5–5.1)
Sodium: 139 mEq/L (ref 135–145)
Total Bilirubin: 0.4 mg/dL (ref 0.3–1.2)

## 2012-06-10 LAB — GLUCOSE, CAPILLARY
Glucose-Capillary: 108 mg/dL — ABNORMAL HIGH (ref 70–99)
Glucose-Capillary: 161 mg/dL — ABNORMAL HIGH (ref 70–99)

## 2012-06-10 LAB — WET PREP, GENITAL: Trich, Wet Prep: NONE SEEN

## 2012-06-10 SURGERY — APPENDECTOMY, LAPAROSCOPIC
Anesthesia: General | Site: Abdomen | Wound class: Contaminated

## 2012-06-10 MED ORDER — LACTATED RINGERS IV BOLUS (SEPSIS): 1000.0000 mL | Freq: Three times a day (TID) | INTRAVENOUS | Status: DC | PRN

## 2012-06-10 MED ORDER — ONDANSETRON HCL 4 MG/2ML IJ SOLN
INTRAMUSCULAR | Status: DC | PRN
  Administered 2012-06-10: 4 mg via INTRAVENOUS

## 2012-06-10 MED ORDER — BUPIVACAINE-EPINEPHRINE 0.25% -1:200000 IJ SOLN
INTRAMUSCULAR | Status: DC | PRN
  Administered 2012-06-10: 50 mL

## 2012-06-10 MED ORDER — HYDROMORPHONE HCL PF 1 MG/ML IJ SOLN: 0.5000 mg | INTRAMUSCULAR | Status: DC | PRN

## 2012-06-10 MED ORDER — IOHEXOL 300 MG/ML  SOLN: 100.0000 mL | Freq: Once | INTRAMUSCULAR | Status: DC | PRN

## 2012-06-10 MED ORDER — PHENOL 1.4 % MT LIQD: 2.0000 | OROMUCOSAL | Status: DC | PRN

## 2012-06-10 MED ORDER — CEFOXITIN SODIUM-DEXTROSE 1-4 GM-% IV SOLR (PREMIX)
INTRAVENOUS | Status: AC
  Filled 2012-06-10: qty 100

## 2012-06-10 MED ORDER — LACTATED RINGERS IR SOLN
Status: DC | PRN
  Administered 2012-06-10: 1000 mL

## 2012-06-10 MED ORDER — NEOSTIGMINE METHYLSULFATE 1 MG/ML IJ SOLN
INTRAMUSCULAR | Status: DC | PRN
  Administered 2012-06-10: 5 mg via INTRAVENOUS

## 2012-06-10 MED ORDER — MENTHOL 3 MG MT LOZG: 1.0000 | LOZENGE | OROMUCOSAL | Status: DC | PRN

## 2012-06-10 MED ORDER — MAGIC MOUTHWASH
15.0000 mL | Freq: Four times a day (QID) | ORAL | Status: DC | PRN
  Filled 2012-06-10: qty 15

## 2012-06-10 MED ORDER — CEFOXITIN SODIUM 2 G IV SOLR
2.0000 g | Freq: Three times a day (TID) | INTRAVENOUS | Status: DC
  Administered 2012-06-10: 2 g via INTRAVENOUS
  Filled 2012-06-10 (×2): qty 2

## 2012-06-10 MED ORDER — INSULIN PEN NEEDLE 32G X 6 MM MISC: 1.8000 mg | Freq: Every day | Status: DC

## 2012-06-10 MED ORDER — 0.9 % SODIUM CHLORIDE (POUR BTL) OPTIME
TOPICAL | Status: DC | PRN
  Administered 2012-06-10: 1000 mL

## 2012-06-10 MED ORDER — MAGNESIUM HYDROXIDE 400 MG/5ML PO SUSP: 30.0000 mL | Freq: Two times a day (BID) | ORAL | Status: DC | PRN

## 2012-06-10 MED ORDER — KETOROLAC TROMETHAMINE 30 MG/ML IJ SOLN
INTRAMUSCULAR | Status: DC | PRN
  Administered 2012-06-10: 30 mg via INTRAVENOUS

## 2012-06-10 MED ORDER — ONDANSETRON HCL 4 MG/2ML IJ SOLN
4.0000 mg | Freq: Four times a day (QID) | INTRAMUSCULAR | Status: DC | PRN
  Administered 2012-06-11: 4 mg via INTRAVENOUS

## 2012-06-10 MED ORDER — PROPOFOL 10 MG/ML IV BOLUS
INTRAVENOUS | Status: DC | PRN
  Administered 2012-06-10: 200 mg via INTRAVENOUS

## 2012-06-10 MED ORDER — METOCLOPRAMIDE HCL 5 MG/ML IJ SOLN
INTRAMUSCULAR | Status: DC | PRN
  Administered 2012-06-10: 10 mg via INTRAVENOUS

## 2012-06-10 MED ORDER — LIRAGLUTIDE 18 MG/3ML ~~LOC~~ SOLN
1.8000 mg | Freq: Every day | SUBCUTANEOUS | Status: DC
  Administered 2012-06-12: 1.8 mg via SUBCUTANEOUS

## 2012-06-10 MED ORDER — ACETAMINOPHEN 650 MG RE SUPP: 650.0000 mg | Freq: Four times a day (QID) | RECTAL | Status: DC | PRN

## 2012-06-10 MED ORDER — FLUTICASONE PROPIONATE 50 MCG/ACT NA SUSP
2.0000 | Freq: Every day | NASAL | Status: DC
  Administered 2012-06-11 – 2012-06-12 (×2): 2 via NASAL
  Filled 2012-06-10: qty 16

## 2012-06-10 MED ORDER — METRONIDAZOLE IVPB CUSTOM
1.0000 g | Freq: Three times a day (TID) | INTRAVENOUS | Status: DC
  Administered 2012-06-10 – 2012-06-11 (×2): 1 g via INTRAVENOUS
  Filled 2012-06-10 (×3): qty 200

## 2012-06-10 MED ORDER — BISACODYL 10 MG RE SUPP: 10.0000 mg | Freq: Two times a day (BID) | RECTAL | Status: DC | PRN

## 2012-06-10 MED ORDER — EPHEDRINE SULFATE 50 MG/ML IJ SOLN
INTRAMUSCULAR | Status: DC | PRN
  Administered 2012-06-10: 5 mg via INTRAVENOUS

## 2012-06-10 MED ORDER — LACTATED RINGERS IV SOLN: INTRAVENOUS | Status: DC

## 2012-06-10 MED ORDER — METRONIDAZOLE IN NACL 5-0.79 MG/ML-% IV SOLN
INTRAVENOUS | Status: AC
  Filled 2012-06-10: qty 200

## 2012-06-10 MED ORDER — METRONIDAZOLE IN NACL 5-0.79 MG/ML-% IV SOLN: INTRAVENOUS | Status: DC | PRN

## 2012-06-10 MED ORDER — BUPIVACAINE-EPINEPHRINE PF 0.25-1:200000 % IJ SOLN
INTRAMUSCULAR | Status: AC
  Filled 2012-06-10: qty 90

## 2012-06-10 MED ORDER — ALBUTEROL SULFATE HFA 108 (90 BASE) MCG/ACT IN AERS: 2.0000 | INHALATION_SPRAY | Freq: Four times a day (QID) | RESPIRATORY_TRACT | Status: DC | PRN

## 2012-06-10 MED ORDER — GLYCOPYRROLATE 0.2 MG/ML IJ SOLN
INTRAMUSCULAR | Status: DC | PRN
  Administered 2012-06-10: .7 mg via INTRAVENOUS

## 2012-06-10 MED ORDER — ATORVASTATIN CALCIUM 40 MG PO TABS
40.0000 mg | ORAL_TABLET | Freq: Every day | ORAL | Status: DC
  Administered 2012-06-11: 40 mg via ORAL
  Filled 2012-06-10 (×3): qty 1

## 2012-06-10 MED ORDER — ACETAMINOPHEN 500 MG PO TABS
1000.0000 mg | ORAL_TABLET | Freq: Three times a day (TID) | ORAL | Status: DC
  Administered 2012-06-11 – 2012-06-12 (×3): 1000 mg via ORAL
  Filled 2012-06-10 (×6): qty 2

## 2012-06-10 MED ORDER — METOPROLOL TARTRATE 12.5 MG HALF TABLET
12.5000 mg | ORAL_TABLET | Freq: Two times a day (BID) | ORAL | Status: DC | PRN
  Filled 2012-06-10: qty 1

## 2012-06-10 MED ORDER — FENTANYL CITRATE 0.05 MG/ML IJ SOLN
INTRAMUSCULAR | Status: DC | PRN
  Administered 2012-06-10: 50 ug via INTRAVENOUS
  Administered 2012-06-10 (×2): 100 ug via INTRAVENOUS

## 2012-06-10 MED ORDER — HYDROMORPHONE HCL PF 1 MG/ML IJ SOLN: 0.2500 mg | INTRAMUSCULAR | Status: DC | PRN

## 2012-06-10 MED ORDER — PROMETHAZINE HCL 25 MG/ML IJ SOLN
12.5000 mg | Freq: Four times a day (QID) | INTRAMUSCULAR | Status: DC | PRN
  Filled 2012-06-10: qty 1

## 2012-06-10 MED ORDER — HEPARIN SODIUM (PORCINE) 5000 UNIT/ML IJ SOLN
5000.0000 [IU] | Freq: Three times a day (TID) | INTRAMUSCULAR | Status: DC
  Administered 2012-06-11 – 2012-06-12 (×4): 5000 [IU] via SUBCUTANEOUS
  Filled 2012-06-10 (×7): qty 1

## 2012-06-10 MED ORDER — ROCURONIUM BROMIDE 100 MG/10ML IV SOLN
INTRAVENOUS | Status: DC | PRN
  Administered 2012-06-10: 30 mg via INTRAVENOUS
  Administered 2012-06-10 (×2): 10 mg via INTRAVENOUS

## 2012-06-10 MED ORDER — LACTATED RINGERS IV BOLUS (SEPSIS)
1000.0000 mL | Freq: Once | INTRAVENOUS | Status: AC
  Administered 2012-06-10: 1000 mL via INTRAVENOUS

## 2012-06-10 MED ORDER — LACTATED RINGERS IV SOLN
INTRAVENOUS | Status: DC | PRN
  Administered 2012-06-10: 22:00:00 via INTRAVENOUS

## 2012-06-10 MED ORDER — LIP MEDEX EX OINT
1.0000 "application " | TOPICAL_OINTMENT | Freq: Two times a day (BID) | CUTANEOUS | Status: DC
  Administered 2012-06-11 – 2012-06-12 (×3): 1 via TOPICAL
  Filled 2012-06-10: qty 7

## 2012-06-10 MED ORDER — DEXTROSE 5 % IV SOLN: 2.0000 g | INTRAVENOUS | Status: DC | PRN

## 2012-06-10 MED ORDER — PROMETHAZINE HCL 12.5 MG PO TABS: 12.5000 mg | ORAL_TABLET | Freq: Four times a day (QID) | ORAL | Status: DC | PRN

## 2012-06-10 MED ORDER — BUPIVACAINE-EPINEPHRINE 0.25% -1:200000 IJ SOLN
INTRAMUSCULAR | Status: AC
  Filled 2012-06-10: qty 1

## 2012-06-10 MED ORDER — DIPHENHYDRAMINE HCL 50 MG/ML IJ SOLN: 12.5000 mg | Freq: Four times a day (QID) | INTRAMUSCULAR | Status: DC | PRN

## 2012-06-10 MED ORDER — ACETAMINOPHEN 325 MG PO TABS: 650.0000 mg | ORAL_TABLET | Freq: Four times a day (QID) | ORAL | Status: DC | PRN

## 2012-06-10 MED ORDER — ACETAMINOPHEN 10 MG/ML IV SOLN
INTRAVENOUS | Status: DC | PRN
  Administered 2012-06-10: 1000 mg via INTRAVENOUS

## 2012-06-10 MED ORDER — BENAZEPRIL HCL 20 MG PO TABS
20.0000 mg | ORAL_TABLET | Freq: Every day | ORAL | Status: DC
  Administered 2012-06-11 – 2012-06-12 (×2): 20 mg via ORAL
  Filled 2012-06-10 (×2): qty 1

## 2012-06-10 MED ORDER — ACETAMINOPHEN 10 MG/ML IV SOLN
INTRAVENOUS | Status: AC
  Filled 2012-06-10: qty 100

## 2012-06-10 MED ORDER — DIPHENHYDRAMINE HCL 12.5 MG/5ML PO ELIX: 12.5000 mg | ORAL_SOLUTION | Freq: Four times a day (QID) | ORAL | Status: DC | PRN

## 2012-06-10 MED ORDER — SUCCINYLCHOLINE CHLORIDE 20 MG/ML IJ SOLN
INTRAMUSCULAR | Status: DC | PRN
  Administered 2012-06-10: 100 mg via INTRAVENOUS

## 2012-06-10 MED ORDER — FUROSEMIDE 40 MG PO TABS
40.0000 mg | ORAL_TABLET | Freq: Every day | ORAL | Status: DC
  Administered 2012-06-11 – 2012-06-12 (×2): 40 mg via ORAL
  Filled 2012-06-10 (×3): qty 1

## 2012-06-10 MED ORDER — OXYCODONE HCL 5 MG PO TABS: 5.0000 mg | ORAL_TABLET | ORAL | Status: DC | PRN

## 2012-06-10 MED ORDER — BUDESONIDE-FORMOTEROL FUMARATE 160-4.5 MCG/ACT IN AERO
2.0000 | INHALATION_SPRAY | Freq: Two times a day (BID) | RESPIRATORY_TRACT | Status: DC
  Administered 2012-06-11 (×2): 2 via RESPIRATORY_TRACT
  Filled 2012-06-10: qty 6

## 2012-06-10 MED ORDER — PANTOPRAZOLE SODIUM 40 MG PO TBEC
80.0000 mg | DELAYED_RELEASE_TABLET | Freq: Every day | ORAL | Status: DC
  Administered 2012-06-11 – 2012-06-12 (×2): 80 mg via ORAL
  Filled 2012-06-10 (×2): qty 2

## 2012-06-10 MED ORDER — ALUM & MAG HYDROXIDE-SIMETH 200-200-20 MG/5ML PO SUSP: 30.0000 mL | Freq: Four times a day (QID) | ORAL | Status: DC | PRN

## 2012-06-10 MED ORDER — PROMETHAZINE HCL 25 MG RE SUPP: 25.0000 mg | Freq: Four times a day (QID) | RECTAL | Status: DC | PRN

## 2012-06-10 MED ORDER — OXYCODONE HCL 5 MG PO TABS
5.0000 mg | ORAL_TABLET | ORAL | Status: DC | PRN
  Administered 2012-06-12: 10 mg via ORAL

## 2012-06-10 MED ORDER — MIDAZOLAM HCL 5 MG/5ML IJ SOLN
INTRAMUSCULAR | Status: DC | PRN
  Administered 2012-06-10: 2 mg via INTRAVENOUS

## 2012-06-10 SURGICAL SUPPLY — 50 items
APPLIER CLIP 5 13 M/L LIGAMAX5 (MISCELLANEOUS)
APPLIER CLIP ROT 10 11.4 M/L (STAPLE)
APR CLP MED LRG 11.4X10 (STAPLE)
APR CLP MED LRG 5 ANG JAW (MISCELLANEOUS)
BAG SPEC RTRVL LRG 6X4 10 (ENDOMECHANICALS) ×1
CABLE HIGH FREQUENCY MONO STRZ (ELECTRODE) ×1 IMPLANT
CANISTER SUCTION 2500CC (MISCELLANEOUS) ×2 IMPLANT
CLIP APPLIE 5 13 M/L LIGAMAX5 (MISCELLANEOUS) IMPLANT
CLIP APPLIE ROT 10 11.4 M/L (STAPLE) IMPLANT
CLOTH BEACON ORANGE TIMEOUT ST (SAFETY) ×2 IMPLANT
CUTTER FLEX LINEAR 45M (STAPLE) ×1 IMPLANT
DECANTER SPIKE VIAL GLASS SM (MISCELLANEOUS) ×2 IMPLANT
DRAPE LAPAROSCOPIC ABDOMINAL (DRAPES) ×2 IMPLANT
DRAPE UTILITY XL STRL (DRAPES) ×2 IMPLANT
DRSG TEGADERM 2-3/8X2-3/4 SM (GAUZE/BANDAGES/DRESSINGS) ×4 IMPLANT
DRSG TEGADERM 4X4.75 (GAUZE/BANDAGES/DRESSINGS) ×2 IMPLANT
ELECT REM PT RETURN 9FT ADLT (ELECTROSURGICAL) ×2
ELECTRODE REM PT RTRN 9FT ADLT (ELECTROSURGICAL) ×1 IMPLANT
ENDOLOOP SUT PDS II  0 18 (SUTURE)
ENDOLOOP SUT PDS II 0 18 (SUTURE) IMPLANT
GLOVE BIOGEL PI IND STRL 6 (GLOVE) IMPLANT
GLOVE BIOGEL PI IND STRL 7.0 (GLOVE) ×1 IMPLANT
GLOVE BIOGEL PI INDICATOR 6 (GLOVE) ×1
GLOVE BIOGEL PI INDICATOR 7.0 (GLOVE)
GLOVE ECLIPSE 8.0 STRL XLNG CF (GLOVE) ×2 IMPLANT
GLOVE INDICATOR 8.0 STRL GRN (GLOVE) ×3 IMPLANT
GLOVE SURG SS PI 6.5 STRL IVOR (GLOVE) ×1 IMPLANT
GLOVE SURG SS PI 8.5 STRL IVOR (GLOVE) ×2
GLOVE SURG SS PI 8.5 STRL STRW (GLOVE) IMPLANT
GOWN STRL NON-REIN LRG LVL3 (GOWN DISPOSABLE) ×2 IMPLANT
GOWN STRL REIN XL XLG (GOWN DISPOSABLE) ×4 IMPLANT
KIT BASIN OR (CUSTOM PROCEDURE TRAY) ×2 IMPLANT
NS IRRIG 1000ML POUR BTL (IV SOLUTION) ×2 IMPLANT
PENCIL BUTTON HOLSTER BLD 10FT (ELECTRODE) ×1 IMPLANT
POUCH SPECIMEN RETRIEVAL 10MM (ENDOMECHANICALS) ×1 IMPLANT
RELOAD 45 VASCULAR/THIN (ENDOMECHANICALS) IMPLANT
RELOAD STAPLE 45 2.5 WHT GRN (ENDOMECHANICALS) IMPLANT
RELOAD STAPLE 45 3.5 BLU ETS (ENDOMECHANICALS) IMPLANT
RELOAD STAPLE TA45 3.5 REG BLU (ENDOMECHANICALS) ×2 IMPLANT
SCISSORS LAP 5X35 DISP (ENDOMECHANICALS) ×1 IMPLANT
SET IRRIG TUBING LAPAROSCOPIC (IRRIGATION / IRRIGATOR) ×2 IMPLANT
SOLUTION ANTI FOG 6CC (MISCELLANEOUS) ×1 IMPLANT
SUT MNCRL AB 4-0 PS2 18 (SUTURE) ×2 IMPLANT
TOWEL OR 17X26 10 PK STRL BLUE (TOWEL DISPOSABLE) ×3 IMPLANT
TRAY FOLEY CATH 14FRSI W/METER (CATHETERS) ×2 IMPLANT
TRAY LAP CHOLE (CUSTOM PROCEDURE TRAY) ×2 IMPLANT
TROCAR XCEL BLADELESS 5X75MML (TROCAR) ×3 IMPLANT
TROCAR Z-THREAD FIOS 12X100MM (TROCAR) ×2 IMPLANT
TROCAR Z-THREAD FIOS 5X100MM (TROCAR) ×1 IMPLANT
TUBING INSUFFLATION 10FT LAP (TUBING) ×2 IMPLANT

## 2012-06-10 NOTE — ED Notes (Signed)
Beeped [facility] in [place] called back on [phone]

## 2012-06-10 NOTE — ED Notes (Signed)
[name]<AV> Expected date:<BR> Expected time:<BR> Means of arrival:<BR> Comments:<BR> Tx from [facility]

## 2012-06-10 NOTE — Transfer of Care (Signed)
Immediate Anesthesia Transfer of Care Note  Patient: Tammy [name]  Procedure(s) Performed: Procedure(s) (LRB) with comments: APPENDECTOMY LAPAROSCOPIC (N/A)  Patient Location: PACU  Anesthesia Type:General  Level of Consciousness: awake and sedated  Airway & Oxygen Therapy: Patient Spontanous Breathing and Patient connected to face mask oxygen  Post-op Assessment: Report given to PACU RN and Post -op Vital signs reviewed and stable  Post vital signs: Reviewed and stable  Complications: No apparent anesthesia complications

## 2012-06-10 NOTE — ED Provider Notes (Signed)
History     CSN: [id number]  Arrival date & time 06/10/12  1520   First MD Initiated Contact with Patient 06/10/12 1544      Chief Complaint  Patient presents with  . Abdominal Pain    (Consider location/radiation/quality/duration/timing/severity/associated sxs/prior treatment) HPI Comments: Patient reports lower abdominal pain for the past 2 weeks it is worse with eating. There is no associated nausea, vomiting, diarrhea, change in bowel or bladder habits. No fevers. She had a CT scan as an outpatient and showed a large appendix and was referred to the ED. She states the pain is worse suprapubically and on the left side. It is worse after eating. Palpation makes it worse, nothing makes it better. Denies any vaginal bleeding or discharge no dysuria or hematuria.  The history is provided by the patient.    Past Medical History  Diagnosis Date  . GERD (gastroesophageal reflux disease)   . Hypertension   . Hyperlipidemia   . Diabetes mellitus     TYPE 2    Past Surgical History  Procedure Date  . Abdominal hysterectomy   . Knee surgery     RIGHT KNEE SURGERY   . Upper gastrointestinal endoscopy 04/14/2006    Normal  . Colonoscopy 04/11/11    5 mm polyp removed but not recovered    Family History  Problem Relation Age of Onset  . Hodgkin's lymphoma    . Hyperlipidemia Mother   . Hypertension Mother   . Cancer Mother 26    hodgkins lymphoma  . Diabetes Father   . Hyperlipidemia Brother   . Colon cancer Neg Hx   . Diabetes Paternal Grandmother     History  Substance Use Topics  . Smoking status: Never Smoker   . Smokeless tobacco: Never Used  . Alcohol Use: No    OB History    Grav Para Term Preterm Abortions TAB SAB Ect Mult Living                  Review of Systems  Constitutional: Positive for activity change and appetite change. Negative for fever.  HENT: Positive for congestion.   Respiratory: Negative for cough, chest tightness and stridor.    Cardiovascular: Negative for chest pain.  Gastrointestinal: Positive for nausea, abdominal pain and diarrhea. Negative for vomiting and constipation.  Genitourinary: Negative for dysuria, hematuria, vaginal bleeding and vaginal discharge.  Musculoskeletal: Negative for back pain.  Skin: Negative for rash.  Neurological: Negative for dizziness.  A complete 10 system review of systems was obtained and all systems are negative except as noted in the HPI and PMH.    Allergies  Red dye and Tuna  Home Medications   Current Outpatient Rx  Name  Route  Sig  Dispense  Refill  . ALBUTEROL SULFATE HFA 108 (90 BASE) MCG/ACT IN AERS   Inhalation   Inhale 2 puffs into the lungs every 6 (six) hours as needed for wheezing.   1 Inhaler   0   . ALPRAZOLAM 0.5 MG PO TABS      Use as directed before procedure   20 tablet   0   . ATORVASTATIN CALCIUM 40 MG PO TABS   Oral   Take 1 tablet (40 mg total) by mouth daily.   90 tablet   3   . BENAZEPRIL HCL 20 MG PO TABS   Oral   Take 1 tablet (20 mg total) by mouth daily.   30 tablet   0   .  BUDESONIDE-FORMOTEROL FUMARATE 160-4.5 MCG/ACT IN AERO   Inhalation   Inhale 2 puffs into the lungs 2 (two) times daily.   1 Inhaler   3   . FUTURO SHEER SUPPORT HOSE MISC      1 pair of support hose to wear daily   1 each   0   . ESOMEPRAZOLE MAGNESIUM 40 MG PO CPDR   Oral   Take 1 capsule (40 mg total) by mouth daily before breakfast.   90 capsule   3   . FLUTICASONE PROPIONATE 50 MCG/ACT NA SUSP   Nasal   Place 2 sprays into the nose daily.   16 g   6   . FUROSEMIDE 40 MG PO TABS   Oral   Take 1 tablet (40 mg total) by mouth daily.   90 tablet   1   . GLUCOSE BLOOD VI STRP      Use as instructed   100 each   2   . INSULIN PEN NEEDLE 32G X 6 MM MISC   Subcutaneous   Inject 1.8 mg into the skin daily.   100 each   3   . LIRAGLUTIDE 18 MG/3ML [place] SOLN   Subcutaneous   Inject 0.3 mLs (1.8 mg total) into the skin daily.    27 mL   3   . LORATADINE 10 MG PO TABS      TAKE 1 TABLET BY MOUTH EVERY DAY   30 tablet   11   . MECLIZINE HCL 25 MG PO TABS   Oral   Take 1 tablet (25 mg total) by mouth 3 (three) times daily as needed for dizziness or nausea.   30 tablet   0   . SULFAMETHOXAZOLE-TMP DS 800-160 MG PO TABS   Oral   Take 1 tablet by mouth 2 (two) times daily.   20 tablet   0     BP 159/89  Pulse 84  Temp 98.4 F (36.9 C) (Oral)  Resp 20  Ht 5\' 6"  (1.676 m)  Wt 342 lb (155.13 kg)  BMI 55.20 kg/m2  SpO2 100%  Physical Exam  Constitutional: She is oriented to person, place, and time. She appears well-developed and well-nourished. No distress.  HENT:  Head: Normocephalic and atraumatic.  Mouth/Throat: Oropharynx is clear and moist. No oropharyngeal exudate.  Eyes: Conjunctivae normal and EOM are normal. Pupils are equal, round, and reactive to light.  Neck: Normal range of motion. Neck supple.  Cardiovascular: Normal rate, regular rhythm and normal heart sounds.   No murmur heard. Pulmonary/Chest: Effort normal and breath sounds normal. No respiratory distress.  Abdominal: Soft. Bowel sounds are normal. There is tenderness. There is no rebound and no guarding.       Obese, TTP suprapubic, LLQ. No guarding or rebound.  Genitourinary: Vaginal discharge found.       Cervix absent. White discharge in vaginal vault. No localizing adnexal pain  Musculoskeletal: Normal range of motion. She exhibits no edema and no tenderness.  Neurological: She is alert and oriented to person, place, and time. No cranial nerve deficit. She exhibits normal muscle tone. Coordination normal.  Skin: Skin is warm.    ED Course  Procedures (including critical care time)  Labs Reviewed  CBC WITH DIFFERENTIAL - Abnormal; Notable for the following:    WBC 3.7 (*)     All other components within normal limits  COMPREHENSIVE METABOLIC PANEL - Abnormal; Notable for the following:    Glucose, Bld 100 (*)  GFR  calc non Af Amer 73 (*)     GFR calc Af Amer 84 (*)     All other components within normal limits  WET PREP, GENITAL - Abnormal; Notable for the following:    Clue Cells Wet Prep HPF POC MANY (*)     WBC, Wet Prep HPF POC RARE (*)     All other components within normal limits  LIPASE, BLOOD  URINALYSIS, ROUTINE W REFLEX MICROSCOPIC  GC/CHLAMYDIA PROBE AMP   Ct Abdomen Pelvis Wo Contrast  06/10/2012  *RADIOLOGY REPORT*  Clinical Data: Persistent abdominal pain  CT ABDOMEN AND PELVIS WITHOUT CONTRAST  Technique:  Multidetector CT imaging of the abdomen and pelvis was performed following the standard protocol following oral but without intravenous contrast. Intravenous contrast was not administered due to inability to secure venous access  Comparison: None.  Findings: Lung bases are clear.  No focal liver lesions are identified on this non intravenous contrast enhanced study.  There is no biliary duct dilatation.  The spleen appears normal with the exception of a few small calcified granulomas.  Pancreas and adrenals appear normal.  Kidneys bilaterally show no mass, hydronephrosis, or calculus on either side.  There is no ureteral calculus or ureterectasis on either side.  In the pelvis, there is a mass anterior urinary bladder on the left side measuring 4.4 x 4.0 cm.  This mass potentially could represent ovary, although the location is more anterior than is generally seen for ovary.  There is no other pelvic mass.  There is no pelvic fluid.  The appendix is dilated measuring 11.5 mm, abnormal.  There is not appear to be significant mesenteric thickening surrounding the appendix, and there is no abscess in the right lower quadrant.  There is no bowel obstruction.  No free air or portal venous air. There is no ascites, adenopathy, or abscess in the abdomen or pelvis. Note that there are several small retroperitoneal lymph nodes which do not meet size criteria for pathologic significance.  Aorta is  nonaneurysmal.  There are no bone lesions.  IMPRESSION: Appendix is dilated, suspicious for appendicitis.  No abscess or surrounding mesenteric thickening, however.  There is a focal structure anterior to the urinary bladder on the left which could represent a prominent left ovary.  Given the location, however, a mass lesion as opposed to ovary etiology must be of concern.  Correlation with pelvic ultrasound advised.  Small calcified granulomas are noted in the spleen.  Comment:  This report was communicated directly by phone to Dr. Loreen Freud, the referring physician, immediately upon review of this study.   Original Report Authenticated By: Bretta Bang, M.D.    US Transvaginal Non-ob  06/10/2012  *RADIOLOGY REPORT*  Clinical Data: Diagnosed with appendicitis on CT today.  Diabetes with obesity.  Left lower quadrant mass seen on CT with pelvic pain.  TRANSABDOMINAL AND TRANSVAGINAL ULTRASOUND OF PELVIS Technique:  Both transabdominal and transvaginal ultrasound examinations of the pelvis were performed. Transabdominal technique was performed for global imaging of the pelvis including vaginal cuff, ovaries, adnexal regions, and pelvic cul-de-sac.  It was necessary to proceed with endovaginal exam following the transabdominal exam to visualize the vaginal cuff.  Comparison:  CT 06/10/2012  Findings:  Uterus: Has been surgically removed.  A normal vaginal cuff is identified  Endometrium: Not applicable  Right ovary:  Is not seen with confidence either transabdominally or endovaginally  Left ovary: Can only be seen transabdominally measuring 4.4 x 3.4 x 2.8 cm.  This contains a unilocular thin-walled simple cyst which would qualify as a dominant follicle given the size of 1.7 x 2.7 x 2.0 cm if the patient is premenopausal.  A second hypoechoic circumscribed lesion is identified measuring 2.0 by 1.8 x 2.4 cm. This demonstrates increased through transmission and has an appearance suspicious for either an  endometrioma or hemorrhagic cyst.  Other findings: No free fluid  IMPRESSION: Normal vaginal cuff.  The left ovary would correlate with the pelvic mass and contains a benign appearing simple cystic area as well as a mildly complex cystic area suspicious for an endometrioma or hemorrhagic cyst. Reassessment would be recommended in 8-12 weeks.  If this represents a hemorrhagic cyst, interval resolution should occur whereas an endometrioma would exhibit no change.   Original Report Authenticated By: Rhodia Albright, M.D.    US Pelvis Complete  06/10/2012  *RADIOLOGY REPORT*  Clinical Data: Diagnosed with appendicitis on CT today.  Diabetes with obesity.  Left lower quadrant mass seen on CT with pelvic pain.  TRANSABDOMINAL AND TRANSVAGINAL ULTRASOUND OF PELVIS Technique:  Both transabdominal and transvaginal ultrasound examinations of the pelvis were performed. Transabdominal technique was performed for global imaging of the pelvis including vaginal cuff, ovaries, adnexal regions, and pelvic cul-de-sac.  It was necessary to proceed with endovaginal exam following the transabdominal exam to visualize the vaginal cuff.  Comparison:  CT 06/10/2012  Findings:  Uterus: Has been surgically removed.  A normal vaginal cuff is identified  Endometrium: Not applicable  Right ovary:  Is not seen with confidence either transabdominally or endovaginally  Left ovary: Can only be seen transabdominally measuring 4.4 x 3.4 x 2.8 cm.  This contains a unilocular thin-walled simple cyst which would qualify as a dominant follicle given the size of 1.7 x 2.7 x 2.0 cm if the patient is premenopausal.  A second hypoechoic circumscribed lesion is identified measuring 2.0 by 1.8 x 2.4 cm. This demonstrates increased through transmission and has an appearance suspicious for either an endometrioma or hemorrhagic cyst.  Other findings: No free fluid  IMPRESSION: Normal vaginal cuff.  The left ovary would correlate with the pelvic mass and  contains a benign appearing simple cystic area as well as a mildly complex cystic area suspicious for an endometrioma or hemorrhagic cyst. Reassessment would be recommended in 8-12 weeks.  If this represents a hemorrhagic cyst, interval resolution should occur whereas an endometrioma would exhibit no change.   Original Report Authenticated By: Rhodia Albright, M.D.      1. Appendicitis   2. Ovarian cyst       MDM  ABdominal pain x 2 weeks, outpatient CT with dilated appendix.  Questionable mass lesion on L side as well.  Lab work is unremarkable. No leukocytosis or fever. Patient is more tender on the left side of her abdomen than right. Question diagnosis of appendicitis.  The mass on her CT scan was further evaluated ultrasound. This is consistent with a large cystic left ovary with possible endometrioma. This may be was causing the patient's pain.  However with the radiologist's suspicion for appendicitis, I spoke with Dr. gross in general surgery who will evaluate the patient at [facility] ED.  Dr. Lynelle Doctor accepting for the ED.      Glynn Octave, MD 06/10/12 1758

## 2012-06-10 NOTE — H&P (Addendum)
Tammy [name]  [birth year] [id number]   This patient is a 52 y.o.female who presents today for surgical evaluation at the request of Dr. [facility] emergency room.  Dr. Loreen Freud, primary care physician.   Reason for evaluation: Abdominal pain.  Enlarged appendix.  Question appendicitis.  Pleasant morbidly obese female who has been struggling with abdominal pain for the past few weeks.  A month or so ago, she had severe nausea and vomiting and diarrhea.  Diagnosed with gastroenteritis.  Gradually resolve.  Patient was diffuse vague periumbilical abdominal pain.  Is now focal in the more on the right side and the right lower quadrant.  Recurs when she is.  Some nausea but no definite emesis.  Usually two regular bowel movements a day although today's was somewhat loose.  No severe fevers chills or sweats.  This does not feel like gastroenteritis.  She has mild heartburn controlled with Nexium.  This is not like that.  Normally can tolerate most foods.  Tends to avoid tuna and any red dye issues to two severe vomiting associated with that.  Sometimes he will get vomiting just with drinking water.  That has been an issue for some time.  This abdominal pain is different.  Occasionally some discomfort on the left side but not severe.  She had a hysterectomy partially done for fibroids and bleeding.  No issues since.  Workup reveals a cyst on the left ovary but not on the right.  She has some soreness on the left at from the intermittent retching but the pain is really on the right side.  She does have chronic low back pain and that has not changed.  Pain is not in the right upper side but on the right lower side.  History of chronic reflux controlled with Nexium.  This is not like that.  No dysphagia to solids or liquids.  No history of hepatitis.  No alcohol ingestion.  No history of pancreatitis.  No history of bowel obstructions.  No history of fall or trauma.  No intermittent  obstruction/diarrhea like symptoms.  No rectal bleeding or anal bleeding.  No hematochezia.  No personal nor family history of GI/colon cancer, inflammatory bowel disease, irritable bowel syndrome, allergy such as Celiac Sprue, dietary/dairy problems, colitis, ulcers nor gastritis.  No recent sick contacts.  No travel outside the country.  No changes in diet.  Had a screening colonoscopy done last year.  Just noted a Small polyp.  That was it.    Past Medical History  Diagnosis Date  . GERD (gastroesophageal reflux disease)   . Hypertension   . Hyperlipidemia   . Diabetes mellitus     TYPE 2    Past Surgical History  Procedure Date  . Abdominal hysterectomy   . Knee surgery     RIGHT KNEE SURGERY   . Upper gastrointestinal endoscopy 04/14/2006    Normal  . Colonoscopy 04/11/11    5 mm polyp removed but not recovered    History   Social History  . Marital Status: Married    Spouse Name: N/A    Number of Children: 0  . 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 Smoking status: Never Smoker   . Smokeless tobacco: Never Used  . Alcohol Use: No  . Drug Use: No  . Sexually Active: Yes -- Female partner(s)   Other Topics Concern  .  Not on file   Social History Narrative  . No narrative on file    Family History  Problem Relation Age of Onset  . Hodgkin's lymphoma    . Hyperlipidemia Mother   . Hypertension Mother   . Cancer Mother 83    hodgkins lymphoma  . Diabetes Father   . Hyperlipidemia Brother   . Colon cancer Neg Hx   . Diabetes Paternal Grandmother     Current Facility-Administered Medications  Medication Dose Route Frequency Provider Last Rate Last Dose  . acetaminophen (TYLENOL) tablet 650 mg  650 mg Oral Q6H PRN Ardeth Sportsman, MD       Or  . acetaminophen (TYLENOL) suppository 650 mg  650 mg Rectal Q6H PRN Ardeth Sportsman, MD      . acetaminophen (TYLENOL) tablet 1,000 mg   1,000 mg Oral TID Ardeth Sportsman, MD      . albuterol (PROVENTIL HFA;VENTOLIN HFA) 108 (90 BASE) MCG/ACT inhaler 2 puff  2 puff Inhalation Q6H PRN Ardeth Sportsman, MD      . alum & mag hydroxide-simeth (MAALOX/MYLANTA) 200-200-20 MG/5ML suspension 30 mL  30 mL Oral Q6H PRN Ardeth Sportsman, MD      . atorvastatin (LIPITOR) tablet 40 mg  40 mg Oral Daily Ardeth Sportsman, MD      . benazepril (LOTENSIN) tablet 20 mg  20 mg Oral Daily Ardeth Sportsman, MD      . bisacodyl (DULCOLAX) suppository 10 mg  10 mg Rectal Q12H PRN Ardeth Sportsman, MD      . budesonide-formoterol (SYMBICORT) 160-4.5 MCG/ACT inhaler 2 puff  2 puff Inhalation BID Ardeth Sportsman, MD      . cefOXitin (MEFOXIN) 2 g in dextrose 5 % 50 mL IVPB  2 g Intravenous Q8H Ardeth Sportsman, MD      . diphenhydrAMINE (BENADRYL) injection 12.5-25 mg  12.5-25 mg Intravenous Q6H PRN Ardeth Sportsman, MD       Or  . diphenhydrAMINE (BENADRYL) 12.5 MG/5ML elixir 12.5-25 mg  12.5-25 mg Oral Q6H PRN Ardeth Sportsman, MD      . fluticasone (FLONASE) 50 MCG/ACT nasal spray 2 spray  2 spray Each Nare Daily Ardeth Sportsman, MD      . furosemide (LASIX) tablet 40 mg  40 mg Oral Daily Ardeth Sportsman, MD      . heparin injection 5,000 Units  5,000 Units Subcutaneous Q8H Ardeth Sportsman, MD      . HYDROmorphone (DILAUDID) injection 0.5-2 mg  0.5-2 mg Intravenous Q2H PRN Ardeth Sportsman, MD      . Insulin Pen Needle MISC 1.8 mg  1.8 mg Subcutaneous Daily Ardeth Sportsman, MD      . lactated ringers bolus 1,000 mL  1,000 mL Intravenous Once Ardeth Sportsman, MD      . lactated ringers bolus 1,000 mL  1,000 mL Intravenous Q8H PRN Ardeth Sportsman, MD      . lactated ringers infusion   Intravenous Continuous Ardeth Sportsman, MD      . lip balm (CARMEX) ointment 1 application  1 application Topical BID Ardeth Sportsman, MD      . Liraglutide (VICTOZA) injection SOLN 1.8 mg  1.8 mg Subcutaneous Daily Ardeth Sportsman, MD      . magic mouthwash  15 mL Oral QID PRN  Ardeth Sportsman, MD      . magnesium hydroxide (MILK OF MAGNESIA) suspension 30 mL  30 mL Oral Q12H PRN Ardeth Sportsman, MD      . menthol-cetylpyridinium (CEPACOL) lozenge 3 mg  1 lozenge Oral PRN Ardeth Sportsman, MD      . metoprolol tartrate (LOPRESSOR) tablet 12.5 mg  12.5 mg Oral Q12H PRN Ardeth Sportsman, MD      . ondansetron [facility]) injection 4 mg  4 mg Intravenous Q6H PRN Ardeth Sportsman, MD      . oxyCODONE (Oxy IR/ROXICODONE) immediate release tablet 5-10 mg  5-10 mg Oral Q4H PRN Ardeth Sportsman, MD      . pantoprazole (PROTONIX) EC tablet 80 mg  80 mg Oral Q1200 Ardeth Sportsman, MD      . phenol (CHLORASEPTIC) mouth spray 2 spray  2 spray Mouth/Throat PRN Ardeth Sportsman, MD      . promethazine (PHENERGAN) injection 12.5-25 mg  12.5-25 mg Intravenous Q6H PRN Ardeth Sportsman, MD       Current Outpatient Prescriptions  Medication Sig Dispense Refill  . albuterol (PROVENTIL HFA;VENTOLIN HFA) 108 (90 BASE) MCG/ACT inhaler Inhale 2 puffs into the lungs every 6 (six) hours as needed.      Marland Kitchen atorvastatin (LIPITOR) 40 MG tablet Take 1 tablet (40 mg total) by mouth daily.  90 tablet  3  . benazepril (LOTENSIN) 20 MG tablet Take 1 tablet (20 mg total) by mouth daily.  30 tablet  0  . budesonide-formoterol (SYMBICORT) 160-4.5 MCG/ACT inhaler Inhale 2 puffs into the lungs 2 (two) times daily.  1 Inhaler  3  . Elastic Bandages & Supports (FUTURO SHEER SUPPORT HOSE) MISC 1 packet by Other route daily. 1 pair of support hose to wear daily      . esomeprazole (NEXIUM) 40 MG capsule Take 1 capsule (40 mg total) by mouth daily before breakfast.  90 capsule  3  . fluticasone (FLONASE) 50 MCG/ACT nasal spray Place 2 sprays into the nose daily.      . furosemide (LASIX) 40 MG tablet Take 1 tablet (40 mg total) by mouth daily.  90 tablet  1  . glucose blood test strip Use as instructed      . Insulin Pen Needle (NOVOFINE) 32G X 6 MM MISC Inject 1.8 mg into the skin daily.  100 each  3  . Liraglutide  (VICTOZA) 18 MG/3ML SOLN Inject 0.3 mLs (1.8 mg total) into the skin daily.  27 mL  3  . sulfamethoxazole-trimethoprim (BACTRIM DS) 800-160 MG per tablet Take 1 tablet by mouth 2 (two) times daily.       Facility-Administered Medications Ordered in Other Encounters  Medication Dose Route Frequency Provider Last Rate Last Dose  . iohexol (OMNIPAQUE) 300 MG/ML solution 100 mL  100 mL Intravenous Once PRN Medication Radiologist, MD         Allergies  Allergen Reactions  . Red Dye   . Tuna (Fish Allergy) Nausea And Vomiting    ROS: Constitutional:  No fevers, chills, sweats.  Weight stable Eyes:  No vision changes, No discharge HENT:  No sore throats, nasal drainage Lymph: No neck swelling, No bruising easily Pulmonary:  No cough, productive sputum CV: No orthopnea, PND  Patient walks 45 minutes for about 1 miles without difficulty.  No exertional chest/neck/shoulder/arm pain. GI: No personal nor family history of GI/colon cancer, inflammatory bowel disease, irritable bowel syndrome, allergy such as Celiac Sprue, dietary/dairy problems, colitis, ulcers nor gastritis.  No recent sick contacts.  No travel outside the country.  No changes in diet.  Renal: No UTIs, No hematuria Genital:  No drainage, bleeding, masses Musculoskeletal: No severe joint pain.  Good ROM major joints Skin:  No sores or lesions.  No rashes Heme/Lymph:  No easy bleeding.  No swollen lymph nodes Neuro: No focal weakness/numbness.  No seizures Psych: No suicidal ideation.  No hallucinations  BP 137/67  Pulse 63  Temp 98.2 F (36.8 C) (Oral)  Resp 18  Ht 5\' 6"  (1.676 m)  Wt 342 lb (155.13 kg)  BMI 55.20 kg/m2  SpO2 100%  Physical Exam: General: Pt awake/alert/oriented x4 in no major acute distress Eyes: PERRL, normal EOM. Sclera nonicteric Neuro: CN II-XII intact w/o focal sensory/motor deficits. Lymph: No head/neck/groin lymphadenopathy Psych:  No delerium/psychosis/paranoia HENT: Normocephalic, Mucus  membranes moist.  No thrush Neck: Supple, No tracheal deviation Chest: No pain.  Good respiratory excursion. CV:  Pulses intact.  Regular rhythm Abdomen: Soft, Nondistended.  Morbidly obese.  Clean under panniculus.  +TTP RLQ near McBurney's point under the panniculus.  Min tender LLQ/suprapubic.  No RUQ pain/Murphy's sign.  No incarcerated hernias. Ext:  SCDs BLE.  2+ edema w stockings.  No cyanosis Skin: No petechiae / purpurea.  No major sores Musculoskeletal: No severe joint pain.  Good ROM major joints   Results:   Labs: Results for orders placed during the hospital encounter of 06/10/12 (from the past 48 hour(s))  CBC WITH DIFFERENTIAL     Status: Abnormal   Collection Time   06/10/12  3:50 PM      Component Value Range Comment   WBC 3.7 (*) 4.0 - 10.5 K/uL    RBC 4.47  3.87 - 5.11 MIL/uL    Hemoglobin 13.0  12.0 - 15.0 g/dL    HCT 08.6  57.8 - 46.9 %    MCV 86.6  78.0 - 100.0 fL    MCH 29.1  26.0 - 34.0 pg    MCHC 33.6  30.0 - 36.0 g/dL    RDW 62.9  52.8 - 41.3 %    Platelets 274  150 - 400 K/uL    Neutrophils Relative 57  43 - 77 %    Neutro Abs 2.1  1.7 - 7.7 K/uL    Lymphocytes Relative 35  12 - 46 %    Lymphs Abs 1.3  0.7 - 4.0 K/uL    Monocytes Relative 6  3 - 12 %    Monocytes Absolute 0.2  0.1 - 1.0 K/uL    Eosinophils Relative 2  0 - 5 %    Eosinophils Absolute 0.1  0.0 - 0.7 K/uL    Basophils Relative 0  0 - 1 %    Basophils Absolute 0.0  0.0 - 0.1 K/uL   COMPREHENSIVE METABOLIC PANEL     Status: Abnormal   Collection Time   06/10/12  3:50 PM      Component Value Range Comment   Sodium 139  135 - 145 mEq/L    Potassium 3.8  3.5 - 5.1 mEq/L    Chloride 102  96 - 112 mEq/L    CO2 27  19 - 32 mEq/L    Glucose, Bld 100 (*) 70 - 99 mg/dL    BUN 11  6 - 23 mg/dL    Creatinine, Ser 2.44  0.50 - 1.10 mg/dL    Calcium 8.8  8.4 - 01.0 mg/dL    Total Protein 7.4  6.0 - 8.3 g/dL    Albumin 3.6  3.5 - 5.2 g/dL    AST 20  0 - 37 U/L    ALT 19  0 - 35 U/L    Alkaline  Phosphatase 79  39 - 117 U/L    Total Bilirubin 0.4  0.3 - 1.2 mg/dL    GFR calc non Af Amer 73 (*) >90 mL/min    GFR calc Af Amer 84 (*) >90 mL/min   WET PREP, GENITAL     Status: Abnormal   Collection Time   06/10/12  4:00 PM      Component Value Range Comment   Yeast Wet Prep HPF POC NONE SEEN  NONE SEEN    Trich, Wet Prep NONE SEEN  NONE SEEN    Clue Cells Wet Prep HPF POC MANY (*) NONE SEEN    WBC, Wet Prep HPF POC RARE (*) NONE SEEN   LIPASE, BLOOD     Status: Normal   Collection Time   06/10/12  4:34 PM      Component Value Range Comment   Lipase 39  11 - 59 U/L     Imaging / Studies: Ct Abdomen Pelvis Wo Contrast  06/10/2012  *RADIOLOGY REPORT*  Clinical Data: Persistent abdominal pain  CT ABDOMEN AND PELVIS WITHOUT CONTRAST  Technique:  Multidetector CT imaging of the abdomen and pelvis was performed following the standard protocol following oral but without intravenous contrast. Intravenous contrast was not administered due to inability to secure venous access  Comparison: None.  Findings: Lung bases are clear.  No focal liver lesions are identified on this non intravenous contrast enhanced study.  There is no biliary duct dilatation.  The spleen appears normal with the exception of a few small calcified granulomas.  Pancreas and adrenals appear normal.  Kidneys bilaterally show no mass, hydronephrosis, or calculus on either side.  There is no ureteral calculus or ureterectasis on either side.  In the pelvis, there is a mass anterior urinary bladder on the left side measuring 4.4 x 4.0 cm.  This mass potentially could represent ovary, although the location is more anterior than is generally seen for ovary.  There is no other pelvic mass.  There is no pelvic fluid.  The appendix is dilated measuring 11.5 mm, abnormal.  There is not appear to be significant mesenteric thickening surrounding the appendix, and there is no abscess in the right lower quadrant.  There is no bowel obstruction.  No  free air or portal venous air. There is no ascites, adenopathy, or abscess in the abdomen or pelvis. Note that there are several small retroperitoneal lymph nodes which do not meet size criteria for pathologic significance.  Aorta is nonaneurysmal.  There are no bone lesions.  IMPRESSION: Appendix is dilated, suspicious for appendicitis.  No abscess or surrounding mesenteric thickening, however.  There is a focal structure anterior to the urinary bladder on the left which could represent a prominent left ovary.  Given the location, however, a mass lesion as opposed to ovary etiology must be of concern.  Correlation with pelvic ultrasound advised.  Small calcified granulomas are noted in the spleen.  Comment:  This report was communicated directly by phone to Dr. Loreen Freud, the referring physician, immediately upon review of this study.   Original Report Authenticated By: Bretta Bang, M.D.    US Transvaginal Non-ob  06/10/2012  *RADIOLOGY REPORT*  Clinical Data: Diagnosed with appendicitis on CT today.  Diabetes with obesity.  Left lower quadrant mass seen on CT with pelvic pain.  TRANSABDOMINAL AND TRANSVAGINAL ULTRASOUND OF PELVIS Technique:  Both transabdominal  and transvaginal ultrasound examinations of the pelvis were performed. Transabdominal technique was performed for global imaging of the pelvis including vaginal cuff, ovaries, adnexal regions, and pelvic cul-de-sac.  It was necessary to proceed with endovaginal exam following the transabdominal exam to visualize the vaginal cuff.  Comparison:  CT 06/10/2012  Findings:  Uterus: Has been surgically removed.  A normal vaginal cuff is identified  Endometrium: Not applicable  Right ovary:  Is not seen with confidence either transabdominally or endovaginally  Left ovary: Can only be seen transabdominally measuring 4.4 x 3.4 x 2.8 cm.  This contains a unilocular thin-walled simple cyst which would qualify as a dominant follicle given the size of 1.7 x  2.7 x 2.0 cm if the patient is premenopausal.  A second hypoechoic circumscribed lesion is identified measuring 2.0 by 1.8 x 2.4 cm. This demonstrates increased through transmission and has an appearance suspicious for either an endometrioma or hemorrhagic cyst.  Other findings: No free fluid  IMPRESSION: Normal vaginal cuff.  The left ovary would correlate with the pelvic mass and contains a benign appearing simple cystic area as well as a mildly complex cystic area suspicious for an endometrioma or hemorrhagic cyst. Reassessment would be recommended in 8-12 weeks.  If this represents a hemorrhagic cyst, interval resolution should occur whereas an endometrioma would exhibit no change.   Original Report Authenticated By: Rhodia Albright, M.D.    US Pelvis Complete  06/10/2012  *RADIOLOGY REPORT*  Clinical Data: Diagnosed with appendicitis on CT today.  Diabetes with obesity.  Left lower quadrant mass seen on CT with pelvic pain.  TRANSABDOMINAL AND TRANSVAGINAL ULTRASOUND OF PELVIS Technique:  Both transabdominal and transvaginal ultrasound examinations of the pelvis were performed. Transabdominal technique was performed for global imaging of the pelvis including vaginal cuff, ovaries, adnexal regions, and pelvic cul-de-sac.  It was necessary to proceed with endovaginal exam following the transabdominal exam to visualize the vaginal cuff.  Comparison:  CT 06/10/2012  Findings:  Uterus: Has been surgically removed.  A normal vaginal cuff is identified  Endometrium: Not applicable  Right ovary:  Is not seen with confidence either transabdominally or endovaginally  Left ovary: Can only be seen transabdominally measuring 4.4 x 3.4 x 2.8 cm.  This contains a unilocular thin-walled simple cyst which would qualify as a dominant follicle given the size of 1.7 x 2.7 x 2.0 cm if the patient is premenopausal.  A second hypoechoic circumscribed lesion is identified measuring 2.0 by 1.8 x 2.4 cm. This demonstrates increased  through transmission and has an appearance suspicious for either an endometrioma or hemorrhagic cyst.  Other findings: No free fluid  IMPRESSION: Normal vaginal cuff.  The left ovary would correlate with the pelvic mass and contains a benign appearing simple cystic area as well as a mildly complex cystic area suspicious for an endometrioma or hemorrhagic cyst. Reassessment would be recommended in 8-12 weeks.  If this represents a hemorrhagic cyst, interval resolution should occur whereas an endometrioma would exhibit no change.   Original Report Authenticated By: Rhodia Albright, M.D.     Medications / Allergies: per chart  Antibiotics: Anti-infectives     Start     Dose/Rate Route Frequency Ordered Stop   06/10/12 2200   cefOXitin (MEFOXIN) 2 g in dextrose 5 % 50 mL IVPB        2 g 100 mL/hr over 30 Minutes Intravenous 3 times per day 06/10/12 2009            Assessment  Tammy [name]  52 y.o. female       Problem List:  Principal Problem:  *Acute appendicitis Active Problems:  DIABETES MELLITUS, TYPE II  Obesity, Class III, BMI 40-49.9 (morbid obesity)  HYPERTENSION  GERD  BACK PAIN, LUMBAR, WITH RADICULOPATHY  ANKLE EDEMA, CHRONIC  Personal history of colonic polyps  Bradycardia, sinus  Bacterial vaginosis   Two-week history of abdominal pain initially vague now and right lower corner with tenderness in right lower quadrant.  CT scan suspicious for thickened appendix.  However, longer time.  And lack of major inflammation her white count makes appendicitis a little less suspicious.  Differential diagnosis otherwise underwhelming.  Plan:  While this is not the classic presentation for appendicitis x2 weeks, she is tender over appendix & it is enlarged.  The rest of her differential diagnosis seems unlikely.  Given the fact that her symptoms are worse and now more focal in the right lower side, I recommended diagnostic laparoscopy with probable appendectomy.   Possibility of other treatments were discussed:  The anatomy & physiology of the digestive tract was discussed.  The pathophysiology of appendicitis was discussed.  Natural history risks without surgery was discussed.   I feel the risks of no intervention will lead to serious problems that outweigh the operative risks; therefore, I recommended diagnostic laparoscopy with removal of appendix to remove the pathology.  Laparoscopic & open techniques were discussed.   I noted a good likelihood this will help address the problem.    Risks such as bleeding, infection, abscess, leak, reoperation, possible ostomy, hernia, heart attack, death, and other risks were discussed.  Goals of post-operative recovery were discussed as well.  We will work to minimize complications.  Questions were answered.  The patient expresses understanding & wishes to proceed with surgery.  -Tx bacterial vaginosis with IV Flagyl x3 days. -DM control -HTN control -VTE prophylaxis- SCDs, etc -mobilize as tolerated to help recovery    Ardeth Sportsman, M.D., F.A.C.S. Gastrointestinal and Minimally Invasive Surgery [facility] [address] [place], [place] [address] [phone] Main / Paging [phone] Voice Mail   06/10/2012  CARE TEAM:  PCP: Loreen Freud, DO  Outpatient Care Team: Patient Care Team: Lelon Perla, DO as PCP - General Iva Boop, MD as Consulting Physician (Gastroenterology)  Inpatient Treatment Team: Treatment Team: Attending Provider: Glynn Octave, MD; Registered Nurse: Hennie Duos, RN; Consulting Physician: Bishop Limbo, MD; Technician: Leonel Ramsay, NT; Registered Nurse: Trisha Mangle, RN; Technician: Hart Carwin, NT

## 2012-06-10 NOTE — ED Provider Notes (Signed)
MSE was initiated and I personally evaluated the patient and placed orders (if any) at  7:14 PM on June 10, 2012.  The patient appears stable so that the remainder of the MSE may be completed by another provider.  Patient was evaluated by Dr. Manus Gunning at  [facility] ED. She had a CT scan that was suspicious for  appendicitis and she was sent to the ER to be evaluated by Dr. Michaell Cowing, surgeon.  19:14 Dr Michaell Cowing notified that patient is in the ED.   19:15 I spoke to Ms Tammy [name] and let her know Dr Michaell Cowing will see her in the ED. She appears comfortable at this time.   Ward Givens, MD 06/10/12 1945

## 2012-06-10 NOTE — ED Notes (Signed)
Pt amb to triage with quick steady gait in nad. Dr. Laury Axon called earlier and spoke with this rn, stated that pt presented to his office c/o abd pain, she sent pt here for o/p ct scan, read + for appy, pt told to return, stopped and had dinner on her way back, per pt "and now my stomach really hurts.Marland KitchenMarland Kitchen"

## 2012-06-10 NOTE — Anesthesia Postprocedure Evaluation (Signed)
  Anesthesia Post-op Note  Patient: Tammy [name]  Procedure(s) Performed: Procedure(s) (LRB) with comments: APPENDECTOMY LAPAROSCOPIC (N/A)  Patient Location: PACU  Anesthesia Type:General  Level of Consciousness: awake and patient cooperative  Airway and Oxygen Therapy: Patient Spontanous Breathing and Patient connected to face mask oxygen  Post-op Pain: none  Post-op Assessment: Post-op Vital signs reviewed, Patient's Cardiovascular Status Stable, Respiratory Function Stable, Patent Airway, No signs of Nausea or vomiting and Pain level controlled  Post-op Vital Signs: Reviewed and stable  Complications: No apparent anesthesia complications

## 2012-06-10 NOTE — Anesthesia Preprocedure Evaluation (Signed)
Anesthesia Evaluation  Patient identified by MRN, date of birth, ID band Patient awake    Reviewed: Allergy & Precautions, H&P , NPO status , Patient's Chart, lab work & pertinent test results  Airway Mallampati: II TM Distance: >3 FB Neck ROM: full    Dental No notable dental hx.    Pulmonary neg pulmonary ROS,  breath sounds clear to auscultation  Pulmonary exam normal       Cardiovascular Exercise Tolerance: Good hypertension, Pt. on medications negative cardio ROS  Rhythm:regular Rate:Normal  Hx. bradycardia   Neuro/Psych negative neurological ROS  negative psych ROS   GI/Hepatic negative GI ROS, Neg liver ROS, GERD-  Medicated and Controlled,  Endo/Other  negative endocrine ROSdiabetes, Well Controlled, Type 2, Insulin DependentMorbid obesity  Renal/GU negative Renal ROS  negative genitourinary   Musculoskeletal   Abdominal (+) + obese,   Peds  Hematology negative hematology ROS (+)   Anesthesia Other Findings   Reproductive/Obstetrics negative OB ROS                           Anesthesia Physical Anesthesia Plan  ASA: III and emergent  Anesthesia Plan: General   Post-op Pain Management:    Induction: Intravenous, Rapid sequence and Cricoid pressure planned  Airway Management Planned: Oral ETT  Additional Equipment:   Intra-op Plan:   Post-operative Plan: Extubation in OR  Informed Consent: I have reviewed the patients History and Physical, chart, labs and discussed the procedure including the risks, benefits and alternatives for the proposed anesthesia with the patient or authorized representative who has indicated his/her understanding and acceptance.   Dental Advisory Given  Plan Discussed with: CRNA and Surgeon  Anesthesia Plan Comments:         Anesthesia Quick Evaluation

## 2012-06-10 NOTE — Op Note (Signed)
11:32 PM  PATIENT:  Tammy [name]  52 y.o. female  Patient Care Team: Lelon Perla, DO as PCP - General Iva Boop, MD as Consulting Physician (Gastroenterology)  PRE-OPERATIVE DIAGNOSIS:    Abdominal pain, possible acute on chronic appendicitis  POST-OPERATIVE DIAGNOSIS:    acute on chronic appendicitis  PROCEDURE:  Procedure(s):  Lap LOA Dx laparoscopy APPENDECTOMY LAPAROSCOPIC  SURGEON:  Surgeon(s): Ardeth Sportsman, MD  ASSISTANT: rn   ANESTHESIA:   local and general  EBL:  Total I/O In: -  Out: 50 [Urine:50]  Delay start of Pharmacological VTE agent (>24hrs) due to surgical blood loss or risk of bleeding:  no  DRAINS: none   SPECIMEN:  Source of Specimen:  Appendix  DISPOSITION OF SPECIMEN:  PATHOLOGY  COUNTS:  YES  PLAN OF CARE: Admit for overnight observation  PATIENT DISPOSITION:  PACU - hemodynamically stable.   INDICATIONS: Patient with concerning symptoms & work up suspicious for appendicitis.  Surgery was recommended:  The anatomy & physiology of the digestive tract was discussed.  The pathophysiology of appendicitis was discussed.  Natural history risks without surgery was discussed.   I feel the risks of no intervention will lead to serious problems that outweigh the operative risks; therefore, I recommended diagnostic laparoscopy with removal of appendix to remove the pathology.  Laparoscopic & open techniques were discussed.   I noted a good likelihood this will help address the problem.    Risks such as bleeding, infection, abscess, leak, reoperation, possible ostomy, hernia, heart attack, death, and other risks were discussed.  Goals of post-operative recovery were discussed as well.  We will work to minimize complications.  Questions were answered.  The patient expresses understanding & wishes to proceed with surgery.  OR FINDINGS: Retrocolic thickened appendix.  Somewhat confirm an inflamed.  No pus.  No perforation.  Moderate  omental adhesions in midline.  Not causing obstruction.  Left ovarian cyst with mild adhesions to rectosigmoid colon.  No obstruction.  Right adnexal structures with mild adhesions to cecum.  No obstruction.  No evidence of endometriosis.  No cholecystitis.  No Meckel's diverticulum.  No interloop adhesions.  DESCRIPTION:   The patient was identified & brought into the operating room. The patient was positioned supine with arms tucked. SCDs were active during the entire case. The patient underwent general anesthesia without any difficulty.  The abdomen was prepped and draped in a sterile fashion. A Surgical Timeout confirmed our plan.  The patient was positioned in reverse Trendelenburg.  Abdominal entry was gained using optical entry technique in the left upper abdomen.  Entry was clean.  I induced carbon dioxide insufflation.  Camera inspection revealed no injury.  Extra ports were carefully placed under direct laparoscopic visualization.  There is a moderate infraumbilical omental adhesions to the anterior abdominal wall.  I carefully isolated and removed them using ultrasonic dissection.  I freed greater omentum adhesions to the right colon as well.  I will mobilized the greater omentum out of the pelvis to the upperabdomen.  I mobilized the terminal ileum to proximal ascending colon in a lateral to medial fashion.  I took care to avoid injuring any retroperitoneal structures. The right fallopian tube had some dense adhesions the cecum but I carefully freed the cecum off it.   I freed the appendix off its attachments to the ascending colon and cecal mesentery.  I elevated the appendix. I skeletonized & ligated the mesoappendix. I was able to free off the base  of the appendix which was still viable.  I stapled the appendix off the cecum using a laparoscopic stapler. I took a healthy cuff viable cecum.  I placed the appendix inside an EndoCatch bag and removed out the 12 mm port.  I did copious  irrigation. Hemostasis was good in the mesoappendix, colon mesentery, and retroperitoneum. Staple line was intact on the cecum with no bleeding. I washed out the pelvis, retrohepatic space and right paracolic gutter. I washed out the left side as well.  Hemostasis is good. There was no perforation or injury. Because the area cleaned up well after irrigation, I did not place a drain.  I ran the small bowel from the cecum to the ligament of Treitz.  There was no Meckel's diverticulum.  There is more no interloop adhesions.  I reinspected the pelvic cul-de-sac.  I freed some adhesions between the left ovary and fallopian tube to the rectosigmoid colon.  It mobilized well.  Diffusion a few adhesions the pelvis I freed off as well.  No evidence of any internal adhesions causing him bowel obstruction.  Gallbladder liver look normal.  Rest anatomy looked normal.  There are no hernias.   I closed the 12 mm fascia site using a 0 Vicryl stitch Using a laparoscopic suture passer under direct visualization.  I aspirated the carbon dioxide. I removed the ports.  I closed skin using 4-0 monocryl stitch.  Patient was extubated and sent to the recovery room.  I discussed the operative findings with the patient's family. I suspect the patient is going used in the hospital at least overnight and will need antibiotics for 3 days, Especially with the diagnosis of bacterial vaginosis. Questions answered. They expressed understanding and appreciation.

## 2012-06-10 NOTE — Preoperative (Signed)
Beta Blockers   Reason not to administer Beta Blockers:Not Applicable, not on home BB 

## 2012-06-11 ENCOUNTER — Encounter (HOSPITAL_COMMUNITY): Payer: Self-pay | Admitting: Surgery

## 2012-06-11 LAB — GLUCOSE, CAPILLARY
Glucose-Capillary: 174 mg/dL — ABNORMAL HIGH (ref 70–99)
Glucose-Capillary: 121 mg/dL — ABNORMAL HIGH (ref 70–99)
Glucose-Capillary: 136 mg/dL — ABNORMAL HIGH (ref 70–99)

## 2012-06-11 LAB — HEMOGLOBIN A1C
Hgb A1c MFr Bld: 6.3 % — ABNORMAL HIGH (ref <5.7)
Mean Plasma Glucose: 134 mg/dL — ABNORMAL HIGH (ref <117)

## 2012-06-11 LAB — GC/CHLAMYDIA PROBE AMP
CT Probe RNA: NEGATIVE
GC Probe RNA: NEGATIVE

## 2012-06-11 MED ORDER — DEXTROSE 5 % IV SOLN
2.0000 g | Freq: Three times a day (TID) | INTRAVENOUS | Status: AC
  Administered 2012-06-11 – 2012-06-12 (×3): 2 g via INTRAVENOUS
  Filled 2012-06-11 (×4): qty 2

## 2012-06-11 MED ORDER — INSULIN ASPART 100 UNIT/ML ~~LOC~~ SOLN: 0.0000 [IU] | Freq: Every day | SUBCUTANEOUS | Status: DC

## 2012-06-11 MED ORDER — PROMETHAZINE HCL 25 MG/ML IJ SOLN
12.5000 mg | Freq: Four times a day (QID) | INTRAMUSCULAR | Status: DC | PRN
  Administered 2012-06-11: 25 mg via INTRAVENOUS
  Filled 2012-06-11: qty 1

## 2012-06-11 MED ORDER — INSULIN ASPART 100 UNIT/ML ~~LOC~~ SOLN
0.0000 [IU] | Freq: Three times a day (TID) | SUBCUTANEOUS | Status: DC
  Administered 2012-06-11: 2 [IU] via SUBCUTANEOUS
  Administered 2012-06-11: 3 [IU] via SUBCUTANEOUS
  Administered 2012-06-12 (×2): 2 [IU] via SUBCUTANEOUS

## 2012-06-11 MED ORDER — ONDANSETRON HCL 4 MG/2ML IJ SOLN
INTRAMUSCULAR | Status: AC
  Filled 2012-06-11: qty 2

## 2012-06-11 MED ORDER — LACTATED RINGERS IV SOLN
INTRAVENOUS | Status: DC
  Administered 2012-06-11 (×2): via INTRAVENOUS
  Administered 2012-06-12: 75 mL/h via INTRAVENOUS

## 2012-06-11 MED ORDER — INFLUENZA VIRUS VACC SPLIT PF IM SUSP
0.5000 mL | INTRAMUSCULAR | Status: DC
  Filled 2012-06-11 (×2): qty 0.5

## 2012-06-11 MED ORDER — METRONIDAZOLE IN NACL 5-0.79 MG/ML-% IV SOLN
500.0000 mg | Freq: Three times a day (TID) | INTRAVENOUS | Status: DC
  Administered 2012-06-11 – 2012-06-12 (×3): 500 mg via INTRAVENOUS
  Filled 2012-06-11 (×5): qty 100

## 2012-06-11 NOTE — Progress Notes (Signed)
Nutrition Brief Note:   Body mass index is 55.20 kg/(m^2). - Morbid Obesity   Wt Readings from Last 10 Encounters:  06/10/12 342 lb (155.13 kg)  06/10/12 342 lb (155.13 kg)  06/09/12 353 lb (160.12 kg)  05/04/12 349 lb 9.6 oz (158.578 kg)  03/29/12 349 lb 13.9 oz (158.7 kg)  03/23/12 353 lb (160.12 kg)  01/08/12 355 lb 9.6 oz (161.299 kg)  10/15/11 344 lb (156.037 kg)  08/21/11 347 lb (157.398 kg)  05/14/11 350 lb 3.2 oz (158.85 kg)    RD pulled to pt due to high score on malnutrition screening tool. Risk screen indicated pt felt she had lost weight (~5 lbs) unintentionally.  RD spoke to pt about weight history. Pt indicated she was unsure if she had really lost weight. Pt indicated her decreased appetite has only been over the past 2 days when she was not feeling well. When RD spoke with pt, pt was eating dinner and said she was hungry and her appetite had returned. Pt had finished 90% of her tray. Pt indicated she was currently getting enough to eat and was not worried about her nutrition status at this time. No interventions necessary at this time.  Belenda Cruise  Dietetic Intern Pager: [phone]

## 2012-06-11 NOTE — Care Management Note (Signed)
    Page 1 of 1   06/11/2012     10:08:42 AM   CARE MANAGEMENT NOTE 06/11/2012  Patient:  Tammy [name], Tammy [name]   Account Number:  [id number]  Date Initiated:  06/11/2012  Documentation initiated by:  Lorenda Ishihara  Subjective/Objective Assessment:   52 yo female admitted s/p lap appy. PTA lived at home with spouse.     Action/Plan:   Home when stable   Anticipated DC Date:  06/11/2012   Anticipated DC Plan:  HOME/SELF CARE      DC Planning Services  CM consult      Choice offered to / List presented to:             Status of service:  Completed, signed off Medicare Important Message given?   (If response is "NO", the following Medicare IM given date fields will be blank) Date Medicare IM given:   Date Additional Medicare IM given:    Discharge Disposition:  HOME/SELF CARE  Per UR Regulation:  Reviewed for med. necessity/level of care/duration of stay  If discussed at Long Length of Stay Meetings, dates discussed:    Comments:

## 2012-06-11 NOTE — Progress Notes (Signed)
Patient ID: Tammy [name], female   [name]: 04-May-1961, 52 y.o.   MRN: [id number] 1 Day Post-Op  Subjective: Pt is nauseous.  Hasn't been able to eat.  Pain is well controlled.  Objective: Vital signs in last 24 hours: Temp:  [97.3 F (36.3 C)-98.4 F (36.9 C)] 97.9 F (36.6 C) (02/07 0942) Pulse Rate:  [55-91] 65  (02/07 0942) Resp:  [14-20] 16  (02/07 0942) BP: (120-159)/(57-89) 130/77 mmHg (02/07 0942) SpO2:  [94 %-100 %] 100 % (02/07 0942) Weight:  [342 lb (155.13 kg)] 342 lb (155.13 kg) (02/06 1537)    Intake/Output from previous day: 02/06 0701 - 02/07 0700 In: 1341.3 [I.V.:1091.3; IV Piggyback:250] Out: 100 [Urine:100] Intake/Output this shift: Total I/O In: 300 [I.V.:300] Out: -   PE: Abd: obese, minimal BS, appropriately tender, incisions c/d/i with gauze and tegaderm.  Lab Results:   Basename 06/10/12 1550 06/09/12 1616  WBC 3.7* 4.3*  HGB 13.0 12.0  HCT 38.7 36.0  PLT 274 259.0   BMET  Basename 06/10/12 1550 06/09/12 1616  NA 139 139  K 3.8 4.2  CL 102 105  CO2 27 29  GLUCOSE 100* 92  BUN 11 15  CREATININE 0.90 1.1  CALCIUM 8.8 8.4   PT/INR No results found for this basename: LABPROT:2,INR:2 in the last 72 hours CMP     Component Value Date/Time   NA 139 06/10/2012 1550   K 3.8 06/10/2012 1550   CL 102 06/10/2012 1550   CO2 27 06/10/2012 1550   GLUCOSE 100* 06/10/2012 1550   BUN 11 06/10/2012 1550   CREATININE 0.90 06/10/2012 1550   CALCIUM 8.8 06/10/2012 1550   PROT 7.4 06/10/2012 1550   ALBUMIN 3.6 06/10/2012 1550   AST 20 06/10/2012 1550   ALT 19 06/10/2012 1550   ALKPHOS 79 06/10/2012 1550   BILITOT 0.4 06/10/2012 1550   GFRNONAA 73* 06/10/2012 1550   GFRAA 84* 06/10/2012 1550   Lipase     Component Value Date/Time   LIPASE 39 06/10/2012 1634       Studies/Results: Ct Abdomen Pelvis Wo Contrast  06/10/2012  *RADIOLOGY REPORT*  Clinical Data: Persistent abdominal pain  CT ABDOMEN AND PELVIS WITHOUT CONTRAST  Technique:  Multidetector CT imaging of the  abdomen and pelvis was performed following the standard protocol following oral but without intravenous contrast. Intravenous contrast was not administered due to inability to secure venous access  Comparison: None.  Findings: Lung bases are clear.  No focal liver lesions are identified on this non intravenous contrast enhanced study.  There is no biliary duct dilatation.  The spleen appears normal with the exception of a few small calcified granulomas.  Pancreas and adrenals appear normal.  Kidneys bilaterally show no mass, hydronephrosis, or calculus on either side.  There is no ureteral calculus or ureterectasis on either side.  In the pelvis, there is a mass anterior urinary bladder on the left side measuring 4.4 x 4.0 cm.  This mass potentially could represent ovary, although the location is more anterior than is generally seen for ovary.  There is no other pelvic mass.  There is no pelvic fluid.  The appendix is dilated measuring 11.5 mm, abnormal.  There is not appear to be significant mesenteric thickening surrounding the appendix, and there is no abscess in the right lower quadrant.  There is no bowel obstruction.  No free air or portal venous air. There is no ascites, adenopathy, or abscess in the abdomen or pelvis. Note that there are  several small retroperitoneal lymph nodes which do not meet size criteria for pathologic significance.  Aorta is nonaneurysmal.  There are no bone lesions.  IMPRESSION: Appendix is dilated, suspicious for appendicitis.  No abscess or surrounding mesenteric thickening, however.  There is a focal structure anterior to the urinary bladder on the left which could represent a prominent left ovary.  Given the location, however, a mass lesion as opposed to ovary etiology must be of concern.  Correlation with pelvic ultrasound advised.  Small calcified granulomas are noted in the spleen.  Comment:  This report was communicated directly by phone to Dr. Loreen Freud, the referring  physician, immediately upon review of this study.   Original Report Authenticated By: Bretta Bang, M.D.    US Transvaginal Non-ob  06/10/2012  *RADIOLOGY REPORT*  Clinical Data: Diagnosed with appendicitis on CT today.  Diabetes with obesity.  Left lower quadrant mass seen on CT with pelvic pain.  TRANSABDOMINAL AND TRANSVAGINAL ULTRASOUND OF PELVIS Technique:  Both transabdominal and transvaginal ultrasound examinations of the pelvis were performed. Transabdominal technique was performed for global imaging of the pelvis including vaginal cuff, ovaries, adnexal regions, and pelvic cul-de-sac.  It was necessary to proceed with endovaginal exam following the transabdominal exam to visualize the vaginal cuff.  Comparison:  CT 06/10/2012  Findings:  Uterus: Has been surgically removed.  A normal vaginal cuff is identified  Endometrium: Not applicable  Right ovary:  Is not seen with confidence either transabdominally or endovaginally  Left ovary: Can only be seen transabdominally measuring 4.4 x 3.4 x 2.8 cm.  This contains a unilocular thin-walled simple cyst which would qualify as a dominant follicle given the size of 1.7 x 2.7 x 2.0 cm if the patient is premenopausal.  A second hypoechoic circumscribed lesion is identified measuring 2.0 by 1.8 x 2.4 cm. This demonstrates increased through transmission and has an appearance suspicious for either an endometrioma or hemorrhagic cyst.  Other findings: No free fluid  IMPRESSION: Normal vaginal cuff.  The left ovary would correlate with the pelvic mass and contains a benign appearing simple cystic area as well as a mildly complex cystic area suspicious for an endometrioma or hemorrhagic cyst. Reassessment would be recommended in 8-12 weeks.  If this represents a hemorrhagic cyst, interval resolution should occur whereas an endometrioma would exhibit no change.   Original Report Authenticated By: Rhodia Albright, M.D.    US Pelvis Complete  06/10/2012  *RADIOLOGY  REPORT*  Clinical Data: Diagnosed with appendicitis on CT today.  Diabetes with obesity.  Left lower quadrant mass seen on CT with pelvic pain.  TRANSABDOMINAL AND TRANSVAGINAL ULTRASOUND OF PELVIS Technique:  Both transabdominal and transvaginal ultrasound examinations of the pelvis were performed. Transabdominal technique was performed for global imaging of the pelvis including vaginal cuff, ovaries, adnexal regions, and pelvic cul-de-sac.  It was necessary to proceed with endovaginal exam following the transabdominal exam to visualize the vaginal cuff.  Comparison:  CT 06/10/2012  Findings:  Uterus: Has been surgically removed.  A normal vaginal cuff is identified  Endometrium: Not applicable  Right ovary:  Is not seen with confidence either transabdominally or endovaginally  Left ovary: Can only be seen transabdominally measuring 4.4 x 3.4 x 2.8 cm.  This contains a unilocular thin-walled simple cyst which would qualify as a dominant follicle given the size of 1.7 x 2.7 x 2.0 cm if the patient is premenopausal.  A second hypoechoic circumscribed lesion is identified measuring 2.0 by 1.8 x 2.4 cm.  This demonstrates increased through transmission and has an appearance suspicious for either an endometrioma or hemorrhagic cyst.  Other findings: No free fluid  IMPRESSION: Normal vaginal cuff.  The left ovary would correlate with the pelvic mass and contains a benign appearing simple cystic area as well as a mildly complex cystic area suspicious for an endometrioma or hemorrhagic cyst. Reassessment would be recommended in 8-12 weeks.  If this represents a hemorrhagic cyst, interval resolution should occur whereas an endometrioma would exhibit no change.   Original Report Authenticated By: Rhodia Albright, M.D.     Anti-infectives: Anti-infectives     Start     Dose/Rate Route Frequency Ordered Stop   06/11/12 1400   metroNIDAZOLE (FLAGYL) IVPB 500 mg     Comments: Pharmacy may adjust dosage      500 mg 100  mL/hr over 60 Minutes Intravenous Every 8 hours 06/11/12 1001 06/13/12 2159   06/11/12 0600   cefOXitin (MEFOXIN) 2 g in dextrose 5 % 50 mL IVPB        2 g 100 mL/hr over 30 Minutes Intravenous 3 times per day 06/11/12 0026 06/12/12 0959   06/10/12 2200   cefOXitin (MEFOXIN) 2 g in dextrose 5 % 50 mL IVPB  Status:  Discontinued        2 g 100 mL/hr over 30 Minutes Intravenous 3 times per day 06/10/12 2009 06/11/12 0026   06/10/12 2100   metroNIDAZOLE (FLAGYL) IVPB 1 g  Status:  Discontinued        1 g 200 mL/hr over 60 Minutes Intravenous 3 times per day 06/10/12 2027 06/11/12 1001           Assessment/Plan  1. S/p lap appy  Plan: 1. Patient is nauseated and unable to eat. Will keep for control of nausea. 2. Patient does need to get up and mobilize. 3. Hopefully home in the am.   LOS: 1 day    Briyanna Billingham E 06/11/2012, 11:39 AM Pager: [phone]

## 2012-06-11 NOTE — Progress Notes (Signed)
Pt currently on Heart Healthy diet and eating with tolerance.  Loyce Dys, MS RD LDN Clinical Inpatient Dietitian Pager: [phone] Weekend/After hours pager: [phone]

## 2012-06-11 NOTE — Progress Notes (Addendum)
ANTIBIOTIC CONSULT NOTE - INITIAL  Pharmacy Consult for IV Flagyl Indication: Bacterial vaginosis  Allergies  Allergen Reactions  . Red Dye   . Tuna (Fish Allergy) Nausea And Vomiting    Patient Measurements: Height: 5\' 6"  (167.6 cm) Weight: 342 lb (155.13 kg) IBW/kg (Calculated) : 59.3    Vital Signs: Temp: 97.9 F (36.6 C) (02/07 0942) Temp src: Oral (02/07 0942) BP: 130/77 mmHg (02/07 0942) Pulse Rate: 65  (02/07 0942) Intake/Output from previous day: 02/06 0701 - 02/07 0700 In: 1341.3 [I.V.:1091.3; IV Piggyback:250] Out: 100 [Urine:100] Intake/Output from this shift:    Labs:  Basename 06/10/12 1550 06/09/12 1616  WBC 3.7* 4.3*  HGB 13.0 12.0  PLT 274 259.0  LABCREA -- --  CREATININE 0.90 1.1   Estimated Creatinine Clearance: 113.9 ml/min (by C-G formula based on Cr of 0.9).    Microbiology: Recent Results (from the past 720 hour(s))  WET PREP, GENITAL     Status: Abnormal   Collection Time   06/10/12  4:00 PM      Component Value Range Status Comment   Yeast Wet Prep HPF POC NONE SEEN  NONE SEEN Final    Trich, Wet Prep NONE SEEN  NONE SEEN Final    Clue Cells Wet Prep HPF POC MANY (*) NONE SEEN Final    WBC, Wet Prep HPF POC RARE (*) NONE SEEN Final   GC/CHLAMYDIA PROBE AMP     Status: Normal   Collection Time   06/10/12  4:00 PM      Component Value Range Status Comment   CT Probe RNA NEGATIVE  NEGATIVE Final    GC Probe RNA NEGATIVE  NEGATIVE Final     Medical History: Past Medical History  Diagnosis Date  . GERD (gastroesophageal reflux disease)   . Hypertension   . Hyperlipidemia   . Diabetes mellitus     TYPE 2    Medications:  Scheduled:    . acetaminophen  1,000 mg Oral TID  . atorvastatin  40 mg Oral Q2000  . benazepril  20 mg Oral Daily  . budesonide-formoterol  2 puff Inhalation BID  . cefOXitin  2 g Intravenous Q8H  . fluticasone  2 spray Each Nare Daily  . furosemide  40 mg Oral Daily  . heparin  5,000 Units Subcutaneous  Q8H  . influenza  inactive virus vaccine  0.5 mL Intramuscular Tomorrow-1000  . insulin aspart  0-15 Units Subcutaneous TID WC  . insulin aspart  0-5 Units Subcutaneous QHS  . [COMPLETED] lactated ringers  1,000 mL Intravenous Once  . lip balm  1 application Topical BID  . Liraglutide  1.8 mg Subcutaneous Daily  . metronidazole  500 mg Intravenous Q8H  . ondansetron      . pantoprazole  80 mg Oral Q1200  . [DISCONTINUED] cefOXitin  2 g Intravenous Q8H  . [DISCONTINUED] Insulin Pen Needle  1.8 mg Subcutaneous Daily  . [DISCONTINUED] metronidazole  1 g Intravenous Q8H   Infusions:    . lactated ringers 75 mL/hr at 06/11/12 0418  . [DISCONTINUED] lactated ringers    . [DISCONTINUED] lactated ringers    . [DISCONTINUED] lactated ringers     PRN: acetaminophen, acetaminophen, albuterol, alum & mag hydroxide-simeth, bisacodyl, diphenhydrAMINE, diphenhydrAMINE, HYDROmorphone (DILAUDID) injection, lactated ringers, magic mouthwash, magnesium hydroxide, menthol-cetylpyridinium, metoprolol tartrate, ondansetron, oxyCODONE, phenol, promethazine, [DISCONTINUED] 0.9 % irrigation (POUR BTL), [DISCONTINUED] bupivacaine-EPINEPHrine, [DISCONTINUED]  HYDROmorphone (DILAUDID) injection [DISCONTINUED] lactated ringers  Assessment: 52 y/o F with acute on chronic appendicitis, now  s/p laparoscopic appendectomy on 06/10/12.  She was also found to have bacterial vaginosis.   To receive 3-day course of IV Flagyl for bacterial vaginosis; was initiated on 1 gram IV q8h with pharmacy to adjust dosage as needed.  Also on cefoxitin x 3 doses postoperatively (in addition to preoperative dose) for appendicitis.  Goal of Therapy:  Eradication of infection  Plan:  Flagyl 500 mg IV q8h to complete remainder of 3-day IV course. Suggest consideration of continuing Flagyl 500mg  PO BID thereafter to complete a total of 7 days. Cefoxitin as ordered by attending MD.  Elie Goody, PharmD, BCPS Pager: [phone] 06/11/2012   10:12 AM

## 2012-06-12 LAB — GLUCOSE, CAPILLARY: Glucose-Capillary: 143 mg/dL — ABNORMAL HIGH (ref 70–99)

## 2012-06-12 MED ORDER — OXYCODONE-ACETAMINOPHEN 5-325 MG PO TABS: 1.0000 | ORAL_TABLET | ORAL | Status: DC | PRN

## 2012-06-12 MED ORDER — OXYCODONE HCL 5 MG PO TABS
ORAL_TABLET | ORAL | Status: AC
  Filled 2012-06-12: qty 2

## 2012-06-12 NOTE — Progress Notes (Signed)
Pt discharged to home. Dc instructions given using teach back. No concerns voiced. Prescriptions given. Left unit in wheelchair pushed by nurse tech. Left in good condition.

## 2012-06-12 NOTE — Discharge Summary (Signed)
Physician Discharge Summary  Patient ID: Tammy [name] MRN: [id number] [name]/AGE: [birth year] 52 y.o.  Admit date: 06/10/2012 Discharge date: 06/12/2012  Admission Diagnoses:  appendicitis  Discharge Diagnoses:  same  Principal Problem:   Acute on chronic appendicitis s/p lap appy 06/10/2012 Active Problems:   DIABETES MELLITUS, TYPE II   Obesity, Class III, BMI 40-49.9 (morbid obesity)   HYPERTENSION   GERD   BACK PAIN, LUMBAR, WITH RADICULOPATHY   ANKLE EDEMA, CHRONIC   Personal history of colonic polyps   Bradycardia, sinus   Bacterial vaginosis   Surgery:  Lap appendectomy  Discharged Condition: improved   Hospital Course:   Admitted and underwent lap appendectomy.  Nausea on PD 1.  Doing better and ready for discharge.    Consults: none  Significant Diagnostic Studies: none    Discharge Exam: Blood pressure 132/84, pulse 79, temperature 98.7 F (37.1 C), temperature source Oral, resp. rate 18, height 5\' 6"  (1.676 m), weight 342 lb (155.13 kg), SpO2 96.00%. Incisions are bland and dressings in place.   Disposition: 01-Home or Self Care  Discharge Orders   Future Orders Complete By Expires     Diet Carb Modified  As directed     Discharge instructions  As directed     Comments:      May remove dressings and shower    Increase activity slowly  As directed         Medication List    TAKE these medications       albuterol 108 (90 BASE) MCG/ACT inhaler  Commonly known as:  PROVENTIL HFA;VENTOLIN HFA  Inhale 2 puffs into the lungs every 6 (six) hours as needed.     atorvastatin 40 MG tablet  Commonly known as:  LIPITOR  Take 1 tablet (40 mg total) by mouth daily.     benazepril 20 MG tablet  Commonly known as:  LOTENSIN  Take 1 tablet (20 mg total) by mouth daily.     budesonide-formoterol 160-4.5 MCG/ACT inhaler  Commonly known as:  SYMBICORT  Inhale 2 puffs into the lungs 2 (two) times daily.     esomeprazole 40 MG capsule  Commonly known as:   NEXIUM  Take 1 capsule (40 mg total) by mouth daily before breakfast.     fluticasone 50 MCG/ACT nasal spray  Commonly known as:  FLONASE  Place 2 sprays into the nose daily.     furosemide 40 MG tablet  Commonly known as:  LASIX  Take 1 tablet (40 mg total) by mouth daily.     Futuro Teaching laboratory technician Misc  1 packet by Other route daily. 1 pair of support hose to wear daily     glucose blood test strip  Use as instructed     Insulin Pen Needle 32G X 6 MM Misc  Commonly known as:  NOVOFINE  Inject 1.8 mg into the skin daily.     Liraglutide 18 MG/3ML Soln injection  Commonly known as:  VICTOZA  Inject 0.3 mLs (1.8 mg total) into the skin daily.     oxyCODONE 5 MG immediate release tablet  Commonly known as:  Oxy IR/ROXICODONE  Take 1-2 tablets (5-10 mg total) by mouth every 4 (four) hours as needed.     oxyCODONE-acetaminophen 5-325 MG per tablet  Commonly known as:  ROXICET  Take 1 tablet by mouth every 4 (four) hours as needed for pain.     promethazine 12.5 MG tablet  Commonly known as:  PHENERGAN  Take 1-2  tablets (12.5-25 mg total) by mouth every 6 (six) hours as needed for nausea.     promethazine 25 MG suppository  Commonly known as:  PHENERGAN  Place 1 suppository (25 mg total) rectally every 6 (six) hours as needed for nausea.     sulfamethoxazole-trimethoprim 800-160 MG per tablet  Commonly known as:  BACTRIM DS  Take 1 tablet by mouth 2 (two) times daily.           Follow-up Information   Follow up with GROSS,STEVEN C., MD. Schedule an appointment as soon as possible for a visit in 3 weeks.   Contact information:   [address] [place] [address] [phone]       Signed: Valarie Merino 06/12/2012, 1:34 PM

## 2012-06-14 ENCOUNTER — Telehealth (INDEPENDENT_AMBULATORY_CARE_PROVIDER_SITE_OTHER): Payer: Self-pay

## 2012-06-14 NOTE — Telephone Encounter (Signed)
The pt called and reports she hasn't had a bm yet for 4 days.  She had surgery last week.  She asked for advice.  She is on pain medicine.  I told her drink plenty of liquids.  She should stay on a stool softener while on pain meds.  She can get them over the counter.  I advised she can take Miralax twice a day which she can get over the counter.  I told her to let us know if she continues to not have a bm.

## 2012-06-16 ENCOUNTER — Telehealth: Payer: Self-pay | Admitting: Family Medicine

## 2012-06-16 DIAGNOSIS — R609 Edema, unspecified: Secondary | ICD-10-CM

## 2012-06-16 MED ORDER — FUROSEMIDE 40 MG PO TABS: 40.0000 mg | ORAL_TABLET | Freq: Every day | ORAL | Status: DC

## 2012-06-16 NOTE — Telephone Encounter (Signed)
REFILL Furosemide (Tab) 40 MG Take 1 tablet (40 mg total) by mouth daily -- REQUESTING 90-DAY SUPPLY no last fill date listed last wrt 2.1.13

## 2012-06-19 ENCOUNTER — Other Ambulatory Visit: Payer: Self-pay

## 2012-06-25 ENCOUNTER — Ambulatory Visit (INDEPENDENT_AMBULATORY_CARE_PROVIDER_SITE_OTHER): Payer: BC Managed Care – PPO | Admitting: Surgery

## 2012-06-25 ENCOUNTER — Encounter (INDEPENDENT_AMBULATORY_CARE_PROVIDER_SITE_OTHER): Payer: Self-pay

## 2012-06-25 ENCOUNTER — Encounter (INDEPENDENT_AMBULATORY_CARE_PROVIDER_SITE_OTHER): Payer: Self-pay | Admitting: Surgery

## 2012-06-25 VITALS — BP 124/82 | HR 86 | Resp 18 | Ht 66.0 in | Wt 345.0 lb

## 2012-06-25 DIAGNOSIS — K36 Other appendicitis: Secondary | ICD-10-CM

## 2012-06-25 DIAGNOSIS — Z860100 Personal history of colon polyps, unspecified: Secondary | ICD-10-CM

## 2012-06-25 DIAGNOSIS — E66813 Obesity, class 3: Secondary | ICD-10-CM

## 2012-06-25 MED ORDER — TRAMADOL HCL 50 MG PO TABS: 50.0000 mg | ORAL_TABLET | Freq: Four times a day (QID) | ORAL | Status: DC | PRN

## 2012-06-25 NOTE — Patient Instructions (Signed)
Managing Pain  Pain after surgery or related to activity is often due to strain/injury to muscle, tendon, nerves and/or incisions.  This pain is usually short-term and will improve in a few months.   Many people find it helpful to do the following things TOGETHER to help speed the process of healing and to get back to regular activity more quickly:  1. Avoid heavy physical activity a.  no lifting greater than 20 pounds b. Do not "push through" the pain.  Listen to your body and avoid positions and maneuvers than reproduce the pain c. Walking is okay as tolerated, but go slowly and stop when getting sore.  d. Remember: If it hurts to do it, then don't do it! 2. Take Anti-inflammatory medication  a. Take with food/snack around the clock for 1-2 weeks i. This helps the muscle and nerve tissues become less irritable and calm down faster b. Choose ONE of the following over-the-counter medications: i. Naproxen 220mg tabs (ex. Aleve) 1-2 pills twice a day  ii. Ibuprofen 200mg tabs (ex. Advil, Motrin) 3-4 pills with every meal and just before bedtime iii. Acetaminophen 500mg tabs (Tylenol) 1-2 pills with every meal and just before bedtime 3. Use a Heating pad or Ice/Cold Pack a. 4-6 times a day b. May use warm bath/hottub  or showers 4. Try Gentle Massage and/or Stretching  a. at the area of pain many times a day b. stop if you feel pain - do not overdo it  Try these steps together to help you body heal faster and avoid making things get worse.  Doing just one of these things may not be enough.    If you are not getting better after two weeks or are noticing you are getting worse, contact our office for further advice; we may need to re-evaluate you & see what other things we can do to help.  GETTING TO GOOD BOWEL HEALTH. Irregular bowel habits such as constipation and diarrhea can lead to many problems over time.  Having one soft bowel movement a day is the most important way to prevent  further problems.  The anorectal canal is designed to handle stretching and feces to safely manage our ability to get rid of solid waste (feces, poop, stool) out of our body.  BUT, hard constipated stools can act like ripping concrete bricks and diarrhea can be a burning fire to this very sensitive area of our body, causing inflamed hemorrhoids, anal fissures, increasing risk is perirectal abscesses, abdominal pain/bloating, an making irritable bowel worse.     The goal: ONE SOFT BOWEL MOVEMENT A DAY!  To have soft, regular bowel movements:    Drink at least 8 tall glasses of water a day.     Take plenty of fiber.  Fiber is the undigested part of plant food that passes into the colon, acting s "natures broom" to encourage bowel motility and movement.  Fiber can absorb and hold large amounts of water. This results in a larger, bulkier stool, which is soft and easier to pass. Work gradually over several weeks up to 6 servings a day of fiber (25g a day even more if needed) in the form of: o Vegetables -- Root (potatoes, carrots, turnips), leafy green (lettuce, salad greens, celery, spinach), or cooked high residue (cabbage, broccoli, etc) o Fruit -- Fresh (unpeeled skin & pulp), Dried (prunes, apricots, cherries, etc ),  or stewed ( applesauce)  o Whole grain breads, pasta, etc (whole wheat)  o Bran cereals      Bulking Agents -- This type of water-retaining fiber generally is easily obtained each day by one of the following:  o Psyllium bran -- The psyllium plant is remarkable because its ground seeds can retain so much water. This product is available as Metamucil, Konsyl, Effersyllium, Per Diem Fiber, or the less expensive generic preparation in drug and health food stores. Although labeled a laxative, it really is not a laxative.  o Methylcellulose -- This is another fiber derived from wood which also retains water. It is available as Citrucel. o Polyethylene Glycol - and "artificial" fiber commonly called  Miralax or Glycolax.  It is helpful for people with gassy or bloated feelings with regular fiber o Flax Seed - a less gassy fiber than psyllium   No reading or other relaxing activity while on the toilet. If bowel movements take longer than 5 minutes, you are too constipated   AVOID CONSTIPATION.  High fiber and water intake usually takes care of this.  Sometimes a laxative is needed to stimulate more frequent bowel movements, but    Laxatives are not a good long-term solution as it can wear the colon out. o Osmotics (Milk of Magnesia, Fleets phosphosoda, Magnesium citrate, MiraLax, GoLytely) are safer than  o Stimulants (Senokot, Castor Oil, Dulcolax, Ex Lax)    o Do not take laxatives for more than 7days in a row.    IF SEVERELY CONSTIPATED, try a Bowel Retraining Program: o Do not use laxatives.  o Eat a diet high in roughage, such as bran cereals and leafy vegetables.  o Drink six (6) ounces of prune or apricot juice each morning.  o Eat two (2) large servings of stewed fruit each day.  o Take one (1) heaping tablespoon of a psyllium-based bulking agent twice a day. Use sugar-free sweetener when possible to avoid excessive calories.  o Eat a normal breakfast.  o Set aside 15 minutes after breakfast to sit on the toilet, but do not strain to have a bowel movement.  o If you do not have a bowel movement by the third day, use an enema and repeat the above steps.    Controlling diarrhea o Switch to liquids and simpler foods for a few days to avoid stressing your intestines further. o Avoid dairy products (especially milk & ice cream) for a short time.  The intestines often can lose the ability to digest lactose when stressed. o Avoid foods that cause gassiness or bloating.  Typical foods include beans and other legumes, cabbage, broccoli, and dairy foods.  Every person has some sensitivity to other foods, so listen to our body and avoid those foods that trigger problems for you. o Adding fiber  (Citrucel, Metamucil, psyllium, Miralax) gradually can help thicken stools by absorbing excess fluid and retrain the intestines to act more normally.  Slowly increase the dose over a few weeks.  Too much fiber too soon can backfire and cause cramping & bloating. o Probiotics (such as active yogurt, Align, etc) may help repopulate the intestines and colon with normal bacteria and calm down a sensitive digestive tract.  Most studies show it to be of mild help, though, and such products can be costly. o Medicines:   Bismuth subsalicylate (ex. Kayopectate, Pepto Bismol) every 30 minutes for up to 6 doses can help control diarrhea.  Avoid if pregnant.   Loperamide (Immodium) can slow down diarrhea.  Start with two tablets (4mg total) first and then try one tablet every 6 hours.  Avoid if you   are having fevers or severe pain.  If you are not better or start feeling worse, stop all medicines and call your doctor for advice o Call your doctor if you are getting worse or not better.  Sometimes further testing (cultures, endoscopy, X-ray studies, bloodwork, etc) may be needed to help diagnose and treat the cause of the diarrhea. o  

## 2012-06-25 NOTE — Progress Notes (Signed)
Subjective:     Patient ID: Tammy [name], female   [name]: 1960-11-16, 52 y.o.   MRN: [id number]  HPI  Tammy [name]  12/10/1960 [id number]  Patient Care Team: Lelon Perla, DO as PCP - General Iva Boop, MD as Consulting Physician (Gastroenterology)  This patient is a 52 y.o.female who presents today for surgical evaluation Status post diagnostic laparoscopy and appendectomy 06/10/2012.   Diagnosis Appendix, Incidental - BENIGN APPENDIX WITH MINIMAL INFLAMMATION. - THERE IS NO EVIDENCE OF MALIGNANCY.  Patient comes in today feeling all right.  She comes today with her husband.  Not back to normal yet which concerned her.  Having some abdominal pain but only at the port site in the left abdomen.  No more pain in the right lower abdomen or back.  Mild nausea but going away.  Some constipation but improving.  Trying to walk more.  No fevers or chills.  Energy level improved.  Wants to get back to work but told she has to be full duty before even considering it.  Had questions about paperwork.  Patient Active Problem List  Diagnosis  . DIABETES MELLITUS, TYPE II  . VITAMIN B12 DEFICIENCY  . HYPERLIPIDEMIA  . Obesity, Class III, BMI 40-49.9 (morbid obesity)  . HYPERTENSION  . GERD  . DYSPEPSIA  . BACK PAIN, LUMBAR, WITH RADICULOPATHY  . SYNCOPE  . ANKLE EDEMA, CHRONIC  . OTHER TENOSYNOVITIS OF HAND AND WRIST  . Otalgia  . Personal history of colonic polyps  . Urgency of urination  . Tongue swelling  . Central positional vertigo  . Bradycardia, sinus  . Acute on chronic appendicitis s/p lap appy 06/10/2012  . Bacterial vaginosis    Past Medical History  Diagnosis Date  . GERD (gastroesophageal reflux disease)   . Hypertension   . Hyperlipidemia   . Diabetes mellitus     TYPE 2    Past Surgical History  Procedure Laterality Date  . Abdominal hysterectomy    . Knee surgery      RIGHT KNEE SURGERY   . Upper gastrointestinal endoscopy  04/14/2006    Normal  .  Colonoscopy  04/11/11    5 mm polyp removed but not recovered  . Laparoscopic appendectomy  06/10/2012    Procedure: APPENDECTOMY LAPAROSCOPIC;  Surgeon: Ardeth Sportsman, MD;  Location: [facility];  Service: General;  Laterality: N/A;    History   Social History  . Marital Status: Married    Spouse Name: N/A    Number of Children: 0  . 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 Smoking status: Never Smoker   . Smokeless tobacco: Never Used  . Alcohol Use: No  . Drug Use: No  . Sexually Active: Yes -- Female partner(s)   Other Topics Concern  . Not on file   Social History Narrative  . No narrative on file    Family History  Problem Relation Age of Onset  . Hodgkin's lymphoma    . Hyperlipidemia Mother   . Hypertension Mother   . Cancer Mother 32    hodgkins lymphoma  . Diabetes Father   . Hyperlipidemia Brother   . Colon cancer Neg Hx   . Diabetes Paternal Grandmother     Current Outpatient Prescriptions  Medication Sig Dispense Refill  . albuterol (PROVENTIL HFA;VENTOLIN HFA) 108 (90 BASE) MCG/ACT inhaler Inhale 2 puffs into the lungs every  6 (six) hours as needed.      Marland Kitchen atorvastatin (LIPITOR) 40 MG tablet Take 1 tablet (40 mg total) by mouth daily.  90 tablet  3  . benazepril (LOTENSIN) 20 MG tablet Take 1 tablet (20 mg total) by mouth daily.  30 tablet  0  . budesonide-formoterol (SYMBICORT) 160-4.5 MCG/ACT inhaler Inhale 2 puffs into the lungs 2 (two) times daily.  1 Inhaler  3  . Elastic Bandages & Supports (FUTURO SHEER SUPPORT HOSE) MISC 1 packet by Other route daily. 1 pair of support hose to wear daily      . esomeprazole (NEXIUM) 40 MG capsule Take 1 capsule (40 mg total) by mouth daily before breakfast.  90 capsule  3  . fluticasone (FLONASE) 50 MCG/ACT nasal spray Place 2 sprays into the nose daily.      . furosemide (LASIX) 40 MG tablet Take 1 tablet (40 mg total) by mouth daily.  90  tablet  1  . glucose blood test strip Use as instructed      . Insulin Pen Needle (NOVOFINE) 32G X 6 MM MISC Inject 1.8 mg into the skin daily.  100 each  3  . Liraglutide (VICTOZA) 18 MG/3ML SOLN Inject 0.3 mLs (1.8 mg total) into the skin daily.  27 mL  3  . oxyCODONE (OXY IR/ROXICODONE) 5 MG immediate release tablet Take 1-2 tablets (5-10 mg total) by mouth every 4 (four) hours as needed.  40 tablet  0  . oxyCODONE-acetaminophen (ROXICET) 5-325 MG per tablet Take 1 tablet by mouth every 4 (four) hours as needed for pain.  30 tablet  0  . promethazine (PHENERGAN) 12.5 MG tablet Take 1-2 tablets (12.5-25 mg total) by mouth every 6 (six) hours as needed for nausea.  20 tablet  3  . promethazine (PHENERGAN) 25 MG suppository Place 1 suppository (25 mg total) rectally every 6 (six) hours as needed for nausea.  5 suppository  3  . sulfamethoxazole-trimethoprim (BACTRIM DS) 800-160 MG per tablet Take 1 tablet by mouth 2 (two) times daily.       No current facility-administered medications for this visit.     Allergies  Allergen Reactions  . Red Dye   . Tuna (Fish Allergy) Nausea And Vomiting    BP 124/82  Pulse 86  Resp 18  Ht 5\' 6"  (1.676 m)  Wt 345 lb (156.491 kg)  BMI 55.71 kg/m2  Ct Abdomen Pelvis Wo Contrast  06/10/2012  *RADIOLOGY REPORT*  Clinical Data: Persistent abdominal pain  CT ABDOMEN AND PELVIS WITHOUT CONTRAST  Technique:  Multidetector CT imaging of the abdomen and pelvis was performed following the standard protocol following oral but without intravenous contrast. Intravenous contrast was not administered due to inability to secure venous access  Comparison: None.  Findings: Lung bases are clear.  No focal liver lesions are identified on this non intravenous contrast enhanced study.  There is no biliary duct dilatation.  The spleen appears normal with the exception of a few small calcified granulomas.  Pancreas and adrenals appear normal.  Kidneys bilaterally show no mass,  hydronephrosis, or calculus on either side.  There is no ureteral calculus or ureterectasis on either side.  In the pelvis, there is a mass anterior urinary bladder on the left side measuring 4.4 x 4.0 cm.  This mass potentially could represent ovary, although the location is more anterior than is generally seen for ovary.  There is no other pelvic mass.  There is no pelvic fluid.  The  appendix is dilated measuring 11.5 mm, abnormal.  There is not appear to be significant mesenteric thickening surrounding the appendix, and there is no abscess in the right lower quadrant.  There is no bowel obstruction.  No free air or portal venous air. There is no ascites, adenopathy, or abscess in the abdomen or pelvis. Note that there are several small retroperitoneal lymph nodes which do not meet size criteria for pathologic significance.  Aorta is nonaneurysmal.  There are no bone lesions.  IMPRESSION: Appendix is dilated, suspicious for appendicitis.  No abscess or surrounding mesenteric thickening, however.  There is a focal structure anterior to the urinary bladder on the left which could represent a prominent left ovary.  Given the location, however, a mass lesion as opposed to ovary etiology must be of concern.  Correlation with pelvic ultrasound advised.  Small calcified granulomas are noted in the spleen.  Comment:  This report was communicated directly by phone to Dr. Loreen Freud, the referring physician, immediately upon review of this study.   Original Report Authenticated By: Bretta Bang, M.D.    US Transvaginal Non-ob  06/10/2012  *RADIOLOGY REPORT*  Clinical Data: Diagnosed with appendicitis on CT today.  Diabetes with obesity.  Left lower quadrant mass seen on CT with pelvic pain.  TRANSABDOMINAL AND TRANSVAGINAL ULTRASOUND OF PELVIS Technique:  Both transabdominal and transvaginal ultrasound examinations of the pelvis were performed. Transabdominal technique was performed for global imaging of the pelvis  including vaginal cuff, ovaries, adnexal regions, and pelvic cul-de-sac.  It was necessary to proceed with endovaginal exam following the transabdominal exam to visualize the vaginal cuff.  Comparison:  CT 06/10/2012  Findings:  Uterus: Has been surgically removed.  A normal vaginal cuff is identified  Endometrium: Not applicable  Right ovary:  Is not seen with confidence either transabdominally or endovaginally  Left ovary: Can only be seen transabdominally measuring 4.4 x 3.4 x 2.8 cm.  This contains a unilocular thin-walled simple cyst which would qualify as a dominant follicle given the size of 1.7 x 2.7 x 2.0 cm if the patient is premenopausal.  A second hypoechoic circumscribed lesion is identified measuring 2.0 by 1.8 x 2.4 cm. This demonstrates increased through transmission and has an appearance suspicious for either an endometrioma or hemorrhagic cyst.  Other findings: No free fluid  IMPRESSION: Normal vaginal cuff.  The left ovary would correlate with the pelvic mass and contains a benign appearing simple cystic area as well as a mildly complex cystic area suspicious for an endometrioma or hemorrhagic cyst. Reassessment would be recommended in 8-12 weeks.  If this represents a hemorrhagic cyst, interval resolution should occur whereas an endometrioma would exhibit no change.   Original Report Authenticated By: Rhodia Albright, M.D.    US Pelvis Complete  06/10/2012  *RADIOLOGY REPORT*  Clinical Data: Diagnosed with appendicitis on CT today.  Diabetes with obesity.  Left lower quadrant mass seen on CT with pelvic pain.  TRANSABDOMINAL AND TRANSVAGINAL ULTRASOUND OF PELVIS Technique:  Both transabdominal and transvaginal ultrasound examinations of the pelvis were performed. Transabdominal technique was performed for global imaging of the pelvis including vaginal cuff, ovaries, adnexal regions, and pelvic cul-de-sac.  It was necessary to proceed with endovaginal exam following the transabdominal exam to  visualize the vaginal cuff.  Comparison:  CT 06/10/2012  Findings:  Uterus: Has been surgically removed.  A normal vaginal cuff is identified  Endometrium: Not applicable  Right ovary:  Is not seen with confidence either transabdominally or endovaginally  Left ovary: Can only be seen transabdominally measuring 4.4 x 3.4 x 2.8 cm.  This contains a unilocular thin-walled simple cyst which would qualify as a dominant follicle given the size of 1.7 x 2.7 x 2.0 cm if the patient is premenopausal.  A second hypoechoic circumscribed lesion is identified measuring 2.0 by 1.8 x 2.4 cm. This demonstrates increased through transmission and has an appearance suspicious for either an endometrioma or hemorrhagic cyst.  Other findings: No free fluid  IMPRESSION: Normal vaginal cuff.  The left ovary would correlate with the pelvic mass and contains a benign appearing simple cystic area as well as a mildly complex cystic area suspicious for an endometrioma or hemorrhagic cyst. Reassessment would be recommended in 8-12 weeks.  If this represents a hemorrhagic cyst, interval resolution should occur whereas an endometrioma would exhibit no change.   Original Report Authenticated By: Rhodia Albright, M.D.      Review of Systems  Constitutional: Negative for fever, chills and diaphoresis.  HENT: Negative for ear pain, sore throat and trouble swallowing.   Eyes: Negative for photophobia and visual disturbance.  Respiratory: Negative for cough and choking.   Cardiovascular: Negative for chest pain and palpitations.  Gastrointestinal: Negative for nausea, vomiting, abdominal pain, diarrhea, constipation, anal bleeding and rectal pain.  Genitourinary: Negative for dysuria, frequency and difficulty urinating.  Musculoskeletal: Negative for myalgias and gait problem.  Skin: Negative for color change, pallor and rash.  Neurological: Negative for dizziness, speech difficulty, weakness and numbness.  Hematological: Negative for  adenopathy.  Psychiatric/Behavioral: Negative for confusion and agitation. The patient is not nervous/anxious.        Objective:   Physical Exam  Constitutional: She is oriented to person, place, and time. She appears well-developed and well-nourished. No distress.  HENT:  Head: Normocephalic.  Mouth/Throat: Oropharynx is clear and moist. No oropharyngeal exudate.  Eyes: Conjunctivae and EOM are normal. Pupils are equal, round, and reactive to light. No scleral icterus.  Neck: Normal range of motion. No tracheal deviation present.  Cardiovascular: Normal rate and intact distal pulses.   Pulmonary/Chest: Effort normal. No respiratory distress. She exhibits no tenderness.  Abdominal: Soft. She exhibits no distension. There is no tenderness. Hernia confirmed negative in the right inguinal area and confirmed negative in the left inguinal area.  Incisions clean with normal healing ridges.  No hernias  Genitourinary: No vaginal discharge found.  Musculoskeletal: Normal range of motion. She exhibits no tenderness.  Lymphadenopathy:       Right: No inguinal adenopathy present.       Left: No inguinal adenopathy present.  Neurological: She is alert and oriented to person, place, and time. No cranial nerve deficit. She exhibits normal muscle tone. Coordination normal.  Skin: Skin is warm and dry. No rash noted. She is not diaphoretic.  Psychiatric: She has a normal mood and affect. Her behavior is normal.       Assessment:     Recovering relatively well from appendectomy.  Mild soreness only.     Plan:     Increase activity as tolerated to regular activity.  Do not push through pain.  Okay to return to work in a few weeks.  She can only do full unrestricted activities to wait a little longer.  Diet as tolerated. Bowel regimen to avoid problems.I stressed a fiber regimen will help keep her) minimize further problems of discomfort and nausea.  Return to clinic p.r.n.   Instructions  discussed.  Followup with primary care physician for  other health issues as would normally be done.  Questions answered.  The patient And her husband expressed understanding and appreciation

## 2012-06-28 ENCOUNTER — Encounter (INDEPENDENT_AMBULATORY_CARE_PROVIDER_SITE_OTHER): Payer: BC Managed Care – PPO | Admitting: Surgery

## 2012-07-08 ENCOUNTER — Encounter: Payer: Self-pay | Admitting: Family Medicine

## 2012-07-08 ENCOUNTER — Ambulatory Visit (INDEPENDENT_AMBULATORY_CARE_PROVIDER_SITE_OTHER): Payer: BC Managed Care – PPO | Admitting: Family Medicine

## 2012-07-08 VITALS — BP 140/88 | HR 77 | Temp 98.5°F | Wt 353.6 lb

## 2012-07-08 DIAGNOSIS — K36 Other appendicitis: Secondary | ICD-10-CM

## 2012-07-08 NOTE — Assessment & Plan Note (Signed)
Pt released to go back to work 07/14/2012

## 2012-07-08 NOTE — Progress Notes (Signed)
  Subjective:    Patient ID: Tammy [name], female    [name]: [birth year], 52 y.o.   MRN: [id number]  HPI Pt here to get a note to be released back to work.  Surgeon released her to see me. Pt doing great since surgery and is anxious to go back.   Review of Systems As above    Objective:   Physical Exam  BP 140/88  Pulse 77  Temp(Src) 98.5 F (36.9 C) (Oral)  Wt 353 lb 9.6 oz (160.392 kg)  BMI 57.1 kg/m2  SpO2 98% General appearance: alert, cooperative, appears stated age and no distress Abdomen: soft, non-tender; bowel sounds normal; no masses,  no organomegaly      Assessment & Plan:

## 2012-07-26 ENCOUNTER — Ambulatory Visit (INDEPENDENT_AMBULATORY_CARE_PROVIDER_SITE_OTHER): Payer: BC Managed Care – PPO | Admitting: Family Medicine

## 2012-07-26 ENCOUNTER — Encounter: Payer: Self-pay | Admitting: Family Medicine

## 2012-07-26 VITALS — BP 140/90 | HR 78 | Temp 98.5°F | Ht 66.0 in | Wt 357.3 lb

## 2012-07-26 DIAGNOSIS — M25569 Pain in unspecified knee: Secondary | ICD-10-CM

## 2012-07-26 DIAGNOSIS — M25562 Pain in left knee: Secondary | ICD-10-CM | POA: Insufficient documentation

## 2012-07-26 DIAGNOSIS — G8929 Other chronic pain: Secondary | ICD-10-CM | POA: Insufficient documentation

## 2012-07-26 MED ORDER — NAPROXEN 500 MG PO TABS: 500.0000 mg | ORAL_TABLET | Freq: Two times a day (BID) | ORAL | Status: DC

## 2012-07-26 NOTE — Patient Instructions (Addendum)
We'll call you with your sports med appt Start the Naproxen twice daily- take w/ food Continue to alternate heat and ice Hang in there!!

## 2012-07-26 NOTE — Progress Notes (Signed)
  Subjective:    Patient ID: COREEN SHIPPEE, female    [name]: 1960-06-07, 52 y.o.   MRN: [id number]  HPI Knee pain- L sided, pt was walking uphill on Wednesday when she felt a pop.  Pain is radiating up into medial thigh.  No weakness of leg.  Will develop intense stiffness of leg after sitting.  Pain improves once up and moving.  Pain is located over anterior/lateral aspect.  No crunching/cracking/popping w/ bending or bearing weight.  Unable to cross leg over other knee due to pain.  Pt reports some initial swelling- was alternating heat and cold.   Review of Systems For ROS see HPI     Objective:   Physical Exam  Vitals reviewed. Constitutional: She appears well-developed and well-nourished. No distress.  Musculoskeletal:  Very difficult knee exam due to body habitus L knee pain laterally w/ extension, medially w/ flexion No TTP along joint line No obvious swelling or effusion          Assessment & Plan:

## 2012-07-27 ENCOUNTER — Ambulatory Visit: Payer: BC Managed Care – PPO | Admitting: Family Medicine

## 2012-07-30 ENCOUNTER — Ambulatory Visit: Payer: BC Managed Care – PPO | Admitting: Family Medicine

## 2012-07-30 NOTE — Assessment & Plan Note (Signed)
New.  Start scheduled NSAIDs, alternate ice/heat.  Refer to sports med for better exam and more definitive dx- particularly to r/o meniscal tear.  Pt expressed understanding and is in agreement w/ plan.

## 2012-08-04 ENCOUNTER — Ambulatory Visit (INDEPENDENT_AMBULATORY_CARE_PROVIDER_SITE_OTHER): Payer: BC Managed Care – PPO | Admitting: Family Medicine

## 2012-08-04 ENCOUNTER — Encounter: Payer: Self-pay | Admitting: Family Medicine

## 2012-08-04 ENCOUNTER — Telehealth: Payer: Self-pay | Admitting: Family Medicine

## 2012-08-04 VITALS — BP 158/99 | HR 64 | Ht 67.0 in | Wt 345.0 lb

## 2012-08-04 DIAGNOSIS — M25569 Pain in unspecified knee: Secondary | ICD-10-CM

## 2012-08-04 DIAGNOSIS — M25562 Pain in left knee: Secondary | ICD-10-CM

## 2012-08-04 NOTE — Telephone Encounter (Signed)
Ive done it

## 2012-08-04 NOTE — Patient Instructions (Addendum)
This is most likely due to a flare of arthritis in your knee.  Less likely a degenerative meniscal tear but both are treated similarly to start with. Naproxen 500mg  twice a day with food for pain and inflammation. Take tylenol 500mg  1-2 tabs three times a day for pain. Glucosamine sulfate 750mg  twice a day is a supplement that has been shown to help moderate to severe arthritis. Capsaicin topically up to four times a day may also help with pain. Cortisone injections are an option - will consider this and/or imaging at follow up if you're not improving. It's important that you continue to stay active. Start straight leg raises as shown - 3 sets of 10 once a day to help quad strength and take pressure off your knee. Walker or cane if needed. Heat or ice 15 minutes at a time 3-4 times a day as needed to help with pain. Water aerobics and cycling with low resistance are the best two types of exercise for arthritis. Follow up with me in 4 weeks.

## 2012-08-04 NOTE — Telephone Encounter (Signed)
PT called about wanting to check on her FMLA forms she dropped off last week. Also pt stated that Dr Laury Axon would take it home and finish it for her because it was due soon.

## 2012-08-04 NOTE — Telephone Encounter (Signed)
msg left advising the patient paperwork ready for pick up.      KP

## 2012-08-05 ENCOUNTER — Encounter: Payer: Self-pay | Admitting: Family Medicine

## 2012-08-05 NOTE — Assessment & Plan Note (Signed)
Has known prior h/o DJD.  Her mechanism also would not suggest a new meniscal tear (occurred simply while walking).  Regardless a degenerative tear and DJD both treated the same initially.  Continue with naproxen.  Discussed tylenol, glucosamine, capsaicin.  Declined cortisone shot at this time.  Shown home strengthening exercises for quad.  F/u in 4 weeks.  Consider injection, radiographs depending on how she's doing at that visit.

## 2012-08-05 NOTE — Progress Notes (Signed)
Subjective:    Patient ID: Tammy [name], female    [name]: 09-Jan-1961, 52 y.o.   MRN: [id number]  PCP: Dr. Laury Axon  HPI 52 yo F here for left knee pain.  Patient reports 2 weeks ago she was walking up some steps when her left knee gave a little and she felt a pop within her knee. Did not hurt right away - started to get more sore as time went on. Has been icing, heating knee. Has improved some recently. + swelling and achy pain. Is on feet a lot for work. No catching or locking. Taking naproxen. About 6 years ago was told she had arthritis in left knee and actually had cortisone shots which helped - no real problems since.  Past Medical History  Diagnosis Date  . GERD (gastroesophageal reflux disease)   . Hypertension   . Hyperlipidemia   . Diabetes mellitus     TYPE 2    Current Outpatient Prescriptions on File Prior to Visit  Medication Sig Dispense Refill  . albuterol (PROVENTIL HFA;VENTOLIN HFA) 108 (90 BASE) MCG/ACT inhaler Inhale 2 puffs into the lungs every 6 (six) hours as needed.      Marland Kitchen atorvastatin (LIPITOR) 40 MG tablet Take 1 tablet (40 mg total) by mouth daily.  90 tablet  3  . benazepril (LOTENSIN) 20 MG tablet Take 1 tablet (20 mg total) by mouth daily.  30 tablet  0  . budesonide-formoterol (SYMBICORT) 160-4.5 MCG/ACT inhaler Inhale 2 puffs into the lungs 2 (two) times daily.  1 Inhaler  3  . Elastic Bandages & Supports (FUTURO SHEER SUPPORT HOSE) MISC 1 packet by Other route daily. 1 pair of support hose to wear daily      . esomeprazole (NEXIUM) 40 MG capsule Take 1 capsule (40 mg total) by mouth daily before breakfast.  90 capsule  3  . fluticasone (FLONASE) 50 MCG/ACT nasal spray Place 2 sprays into the nose daily.      . furosemide (LASIX) 40 MG tablet Take 1 tablet (40 mg total) by mouth daily.  90 tablet  1  . glucose blood test strip Use as instructed      . Insulin Pen Needle (NOVOFINE) 32G X 6 MM MISC Inject 1.8 mg into the skin daily.  100 each  3  .  Liraglutide (VICTOZA) 18 MG/3ML SOLN Inject 0.3 mLs (1.8 mg total) into the skin daily.  27 mL  3  . naproxen (NAPROSYN) 500 MG tablet Take 1 tablet (500 mg total) by mouth 2 (two) times daily with a meal.  60 tablet  0  . promethazine (PHENERGAN) 12.5 MG tablet Take 1-2 tablets (12.5-25 mg total) by mouth every 6 (six) hours as needed for nausea.  20 tablet  3  . promethazine (PHENERGAN) 25 MG suppository Place 1 suppository (25 mg total) rectally every 6 (six) hours as needed for nausea.  5 suppository  3  . traMADol (ULTRAM) 50 MG tablet Take 1-2 tablets (50-100 mg total) by mouth every 6 (six) hours as needed for pain.  30 tablet  1   No current facility-administered medications on file prior to visit.    Past Surgical History  Procedure Laterality Date  . Abdominal hysterectomy    . Knee surgery      RIGHT KNEE SURGERY   . Upper gastrointestinal endoscopy  04/14/2006    Normal  . Colonoscopy  04/11/11    5 mm polyp removed but not recovered  . Laparoscopic appendectomy  06/10/2012  Procedure: APPENDECTOMY LAPAROSCOPIC;  Surgeon: Ardeth Sportsman, MD;  Location: [facility];  Service: General;  Laterality: N/A;    Allergies  Allergen Reactions  . Red Dye   . Tuna (Fish Allergy) Nausea And Vomiting    History   Social History  . Marital Status: Married    Spouse Name: N/A    Number of Children: 0  . 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 Smoking status: Never Smoker   . Smokeless tobacco: Never Used  . Alcohol Use: No  . Drug Use: No  . Sexually Active: Yes -- Female partner(s)   Other Topics Concern  . Not on file   Social History Narrative  . No narrative on file    Family History  Problem Relation Age of Onset  . Hodgkin's lymphoma    . Hyperlipidemia Mother   . Hypertension Mother   . Cancer Mother 56    hodgkins lymphoma  . Diabetes Father   . Hyperlipidemia Brother   . Colon  cancer Neg Hx   . Heart attack Neg Hx   . Sudden death Neg Hx   . Diabetes Paternal Grandmother     BP 158/99  Pulse 64  Ht 5\' 7"  (1.702 m)  Wt 345 lb (156.491 kg)  BMI 54.02 kg/m2  Review of Systems See HPI above.    Objective:   Physical Exam Gen: NAD  L knee: No gross deformity, ecchymoses, effusion.  Identification of joint lines difficult due to habitus. TTP medial and lateral joint lines.  No post patellar facet tenderness. ROM 0 - 100 degrees. Negative ant/post drawers. Negative valgus/varus testing. Negative lachmanns. Negative apleys. NV intact distally.     Assessment & Plan:  1. Left knee pain - Has known prior h/o DJD.  Her mechanism also would not suggest a new meniscal tear (occurred simply while walking).  Regardless a degenerative tear and DJD both treated the same initially.  Continue with naproxen.  Discussed tylenol, glucosamine, capsaicin.  Declined cortisone shot at this time.  Shown home strengthening exercises for quad.  F/u in 4 weeks.  Consider injection, radiographs depending on how she's doing at that visit.

## 2012-09-01 ENCOUNTER — Ambulatory Visit (INDEPENDENT_AMBULATORY_CARE_PROVIDER_SITE_OTHER): Payer: BC Managed Care – PPO | Admitting: Family Medicine

## 2012-09-01 ENCOUNTER — Encounter: Payer: Self-pay | Admitting: Family Medicine

## 2012-09-01 VITALS — BP 161/102 | HR 66 | Ht 66.0 in | Wt 348.0 lb

## 2012-09-01 DIAGNOSIS — M25569 Pain in unspecified knee: Secondary | ICD-10-CM

## 2012-09-01 DIAGNOSIS — M25562 Pain in left knee: Secondary | ICD-10-CM

## 2012-09-01 NOTE — Patient Instructions (Addendum)
This is most likely due to a flare of arthritis in your knee.  Less likely a degenerative meniscal tear but both are treated similarly to start with. Naproxen 500mg  twice a day with food for pain and inflammation. Take tylenol 500mg  1-2 tabs three times a day for pain. Glucosamine sulfate 750mg  twice a day is a supplement that has been shown to help moderate to severe arthritis. Capsaicin topically up to four times a day may also help with pain. Cortisone injections are an option - will consider this and/or imaging at follow up if you're not improving. It's important that you continue to stay active. Start straight leg raises as shown - 3 sets of 10 once a day to help quad strength and take pressure off your knee. Walker or cane if needed. Heat or ice 15 minutes at a time 3-4 times a day as needed to help with pain. Water aerobics and cycling with low resistance are the best two types of exercise for arthritis. Follow up with me in 6 weeks or as needed.

## 2012-09-02 ENCOUNTER — Encounter: Payer: Self-pay | Admitting: Family Medicine

## 2012-09-02 NOTE — Assessment & Plan Note (Signed)
Has known prior h/o DJD.  Her mechanism also would not suggest a new meniscal tear (occurred simply while walking).  Has improved with rest, home exercises, naproxen.  Discussed treatment options - she would like to continue with current management for now - declined injection, PT for now.  Previously discussed tylenol, glucosamine, capsaicin.

## 2012-09-02 NOTE — Progress Notes (Signed)
Subjective:    Patient ID: Tammy [name], female    [name]: [birth year], 52 y.o.   MRN: [id number]  PCP: Dr. Laury Axon  HPI  52 yo F here for f/u left knee pain.  4/2: Patient reports 2 weeks ago she was walking up some steps when her left knee gave a little and she felt a pop within her knee. Did not hurt right away - started to get more sore as time went on. Has been icing, heating knee. Has improved some recently. + swelling and achy pain. Is on feet a lot for work. No catching or locking. Taking naproxen. About 6 years ago was told she had arthritis in left knee and actually had cortisone shots which helped - no real problems since.  4/30: Patient returns stating she is better from last visit. Varies in improvement between 20-70% based on the day. Weather changes make this feel worse. Has been taking naproxen, doing home exercises, alternating ice and heat. Pain is worse after work. No catching, locking.  Past Medical History  Diagnosis Date  . GERD (gastroesophageal reflux disease)   . Hypertension   . Hyperlipidemia   . Diabetes mellitus     TYPE 2    Current Outpatient Prescriptions on File Prior to Visit  Medication Sig Dispense Refill  . albuterol (PROVENTIL HFA;VENTOLIN HFA) 108 (90 BASE) MCG/ACT inhaler Inhale 2 puffs into the lungs every 6 (six) hours as needed.      Marland Kitchen atorvastatin (LIPITOR) 40 MG tablet Take 1 tablet (40 mg total) by mouth daily.  90 tablet  3  . benazepril (LOTENSIN) 20 MG tablet Take 1 tablet (20 mg total) by mouth daily.  30 tablet  0  . budesonide-formoterol (SYMBICORT) 160-4.5 MCG/ACT inhaler Inhale 2 puffs into the lungs 2 (two) times daily.  1 Inhaler  3  . Elastic Bandages & Supports (FUTURO SHEER SUPPORT HOSE) MISC 1 packet by Other route daily. 1 pair of support hose to wear daily      . esomeprazole (NEXIUM) 40 MG capsule Take 1 capsule (40 mg total) by mouth daily before breakfast.  90 capsule  3  . fluticasone (FLONASE) 50 MCG/ACT  nasal spray Place 2 sprays into the nose daily.      . furosemide (LASIX) 40 MG tablet Take 1 tablet (40 mg total) by mouth daily.  90 tablet  1  . glucose blood test strip Use as instructed      . Insulin Pen Needle (NOVOFINE) 32G X 6 MM MISC Inject 1.8 mg into the skin daily.  100 each  3  . Liraglutide (VICTOZA) 18 MG/3ML SOLN Inject 0.3 mLs (1.8 mg total) into the skin daily.  27 mL  3  . naproxen (NAPROSYN) 500 MG tablet Take 1 tablet (500 mg total) by mouth 2 (two) times daily with a meal.  60 tablet  0  . promethazine (PHENERGAN) 12.5 MG tablet Take 1-2 tablets (12.5-25 mg total) by mouth every 6 (six) hours as needed for nausea.  20 tablet  3  . promethazine (PHENERGAN) 25 MG suppository Place 1 suppository (25 mg total) rectally every 6 (six) hours as needed for nausea.  5 suppository  3  . traMADol (ULTRAM) 50 MG tablet Take 1-2 tablets (50-100 mg total) by mouth every 6 (six) hours as needed for pain.  30 tablet  1   No current facility-administered medications on file prior to visit.    Past Surgical History  Procedure Laterality Date  . Abdominal  hysterectomy    . Knee surgery      RIGHT KNEE SURGERY   . Upper gastrointestinal endoscopy  04/14/2006    Normal  . Colonoscopy  04/11/11    5 mm polyp removed but not recovered  . Laparoscopic appendectomy  06/10/2012    Procedure: APPENDECTOMY LAPAROSCOPIC;  Surgeon: Ardeth Sportsman, MD;  Location: [facility];  Service: General;  Laterality: N/A;    Allergies  Allergen Reactions  . Red Dye   . Tuna (Fish Allergy) Nausea And Vomiting    History   Social History  . Marital Status: Married    Spouse Name: N/A    Number of Children: 0  . 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 Smoking status: Never Smoker   . Smokeless tobacco: Never Used  . Alcohol Use: No  . Drug Use: No  . Sexually Active: Yes -- Female partner(s)   Other Topics Concern  .  Not on file   Social History Narrative  . No narrative on file    Family History  Problem Relation Age of Onset  . Hodgkin's lymphoma    . Hyperlipidemia Mother   . Hypertension Mother   . Cancer Mother 15    hodgkins lymphoma  . Diabetes Father   . Hyperlipidemia Brother   . Colon cancer Neg Hx   . Heart attack Neg Hx   . Sudden death Neg Hx   . Diabetes Paternal Grandmother     BP 161/102  Pulse 66  Ht 5\' 6"  (1.676 m)  Wt 348 lb (157.852 kg)  BMI 56.2 kg/m2  Review of Systems  See HPI above.    Objective:   Physical Exam  Gen: NAD  L knee: No gross deformity, ecchymoses, effusion.  Identification of joint lines difficult due to habitus. Mild TTP medial joint line.  No post patellar facet tenderness.  Minimal lateral joint line TTP. ROM 0 - 120 degrees. Negative ant/post drawers. Negative valgus/varus testing. Negative lachmanns. Negative apleys, mcmurrays. NV intact distally.     Assessment & Plan:  1. Left knee pain - Has known prior h/o DJD.  Her mechanism also would not suggest a new meniscal tear (occurred simply while walking).  Has improved with rest, home exercises, naproxen.  Discussed treatment options - she would like to continue with current management for now - declined injection, PT for now.  Previously discussed tylenol, glucosamine, capsaicin.

## 2012-09-13 ENCOUNTER — Ambulatory Visit: Payer: BC Managed Care – PPO | Admitting: Family Medicine

## 2012-09-13 ENCOUNTER — Telehealth: Payer: Self-pay | Admitting: Family Medicine

## 2012-09-13 ENCOUNTER — Encounter: Payer: BC Managed Care – PPO | Admitting: Family Medicine

## 2012-09-13 NOTE — Telephone Encounter (Signed)
Pt has appointment tomorrow.

## 2012-09-13 NOTE — Telephone Encounter (Signed)
FYI, Patient just called, cancelled her 3:00pm appointment to see Dr. Laury Axon today, 09/13/12, due to elevated blood pressure.  Patient states, she has got her blood pressure down, but is too dizzy to drive to this appointment.  Patient states if her blood pressure goes back up, she will have someone to drive her to the ER.

## 2012-09-13 NOTE — Telephone Encounter (Signed)
Pt states that BP was 162/95 this am after taking med so she waited 4 hours then took a additional Benazepril 20 mg which has now brought BP to 132/80. Pt indicated that she was not advise to do this but she was so dizzy and her head hurt so bad . Pt indicated that symptom have resolved but she now has some sinus pressure/pain with a slight frontal headache. Pt advise if symptoms worsen over night or prior to her OV tomorrow she will need to be seen in ED pt ok, verbalized  understanding.

## 2012-09-14 ENCOUNTER — Ambulatory Visit: Payer: BC Managed Care – PPO | Admitting: Family Medicine

## 2012-09-15 ENCOUNTER — Encounter: Payer: Self-pay | Admitting: Family Medicine

## 2012-09-15 ENCOUNTER — Ambulatory Visit (INDEPENDENT_AMBULATORY_CARE_PROVIDER_SITE_OTHER): Payer: BC Managed Care – PPO | Admitting: Family Medicine

## 2012-09-15 VITALS — BP 152/90 | HR 100 | Temp 98.6°F | Wt 358.6 lb

## 2012-09-15 VITALS — BP 168/99 | HR 78 | Ht 66.0 in | Wt 348.0 lb

## 2012-09-15 DIAGNOSIS — M25569 Pain in unspecified knee: Secondary | ICD-10-CM

## 2012-09-15 DIAGNOSIS — J309 Allergic rhinitis, unspecified: Secondary | ICD-10-CM

## 2012-09-15 DIAGNOSIS — J302 Other seasonal allergic rhinitis: Secondary | ICD-10-CM

## 2012-09-15 DIAGNOSIS — I10 Essential (primary) hypertension: Secondary | ICD-10-CM

## 2012-09-15 DIAGNOSIS — M25562 Pain in left knee: Secondary | ICD-10-CM

## 2012-09-15 MED ORDER — FLUTICASONE PROPIONATE 50 MCG/ACT NA SUSP: 2.0000 | Freq: Every day | NASAL | Status: DC

## 2012-09-15 MED ORDER — BENAZEPRIL HCL 40 MG PO TABS: 40.0000 mg | ORAL_TABLET | Freq: Every day | ORAL | Status: DC

## 2012-09-15 NOTE — Progress Notes (Signed)
  Subjective:    Patient ID: Tammy [name], female    [name]: 10/25/1960, 52 y.o.   MRN: [id number]  HPI    Review of Systems     Objective:   Physical Exam        Assessment & Plan:   Subjective:    Patient here for follow-up of elevated blood pressure.  She is not exercising and is adherent to a low-salt diet.  Blood pressure is not well controlled at home. Cardiac symptoms: none. Patient denies: chest pain, chest pressure/discomfort, claudication, dyspnea, exertional chest pressure/discomfort, fatigue, irregular heart beat, lower extremity edema, near-syncope, orthopnea, palpitations, paroxysmal nocturnal dyspnea, syncope and tachypnea. Cardiovascular risk factors: hypertension, obesity (BMI >= 30 kg/m2) and sedentary lifestyle. Use of agents associated with hypertension: none. History of target organ damage: none.  The following portions of the patient's history were reviewed and updated as appropriate: allergies, current medications, past family history, past medical history, past social history, past surgical history and problem list.  Review of Systems Pertinent items are noted in HPI.     Objective:    BP 152/90  Pulse 100  Temp(Src) 98.6 F (37 C) (Oral)  Wt 358 lb 9.6 oz (162.66 kg)  BMI 57.91 kg/m2  SpO2 98% General appearance: alert, cooperative, appears stated age and no distress Throat: lips, mucosa, and tongue normal; teeth and gums normal Neck: no adenopathy, supple, symmetrical, trachea midline and thyroid not enlarged, symmetric, no tenderness/mass/nodules Lungs: clear to auscultation bilaterally Heart: S1, S2 normal Extremities: edema both LE --no change    Assessment:    Hypertension, stage 1 . Evidence of target organ damage: none.    Plan:    Medication: increase to bp med--see meds and orders. Dietary sodium restriction. Regular aerobic exercise. Check blood pressures 2-3 times weekly and record. Follow up: 2 weeks and as needed.

## 2012-09-15 NOTE — Progress Notes (Signed)
Subjective:    Patient ID: Tammy [name], female    [name]: 1961-01-13, 52 y.o.   MRN: [id number]  PCP: Dr. Laury Axon  HPI  52 yo F here for f/u left knee pain.  4/2: Patient reports 2 weeks ago she was walking up some steps when her left knee gave a little and she felt a pop within her knee. Did not hurt right away - started to get more sore as time went on. Has been icing, heating knee. Has improved some recently. + swelling and achy pain. Is on feet a lot for work. No catching or locking. Taking naproxen. About 6 years ago was told she had arthritis in left knee and actually had cortisone shots which helped - no real problems since.  4/30: Patient returns stating she is better from last visit. Varies in improvement between 20-70% based on the day. Weather changes make this feel worse. Has been taking naproxen, doing home exercises, alternating ice and heat. Pain is worse after work. No catching, locking.  5/14: Patient returns today for an intraarticular cortisone injection of left knee.  Past Medical History  Diagnosis Date  . GERD (gastroesophageal reflux disease)   . Hypertension   . Hyperlipidemia   . Diabetes mellitus     TYPE 2    Current Outpatient Prescriptions on File Prior to Visit  Medication Sig Dispense Refill  . albuterol (PROVENTIL HFA;VENTOLIN HFA) 108 (90 BASE) MCG/ACT inhaler Inhale 2 puffs into the lungs every 6 (six) hours as needed.      Marland Kitchen atorvastatin (LIPITOR) 40 MG tablet Take 1 tablet (40 mg total) by mouth daily.  90 tablet  3  . benazepril (LOTENSIN) 20 MG tablet Take 1 tablet (20 mg total) by mouth daily.  30 tablet  0  . budesonide-formoterol (SYMBICORT) 160-4.5 MCG/ACT inhaler Inhale 2 puffs into the lungs 2 (two) times daily.  1 Inhaler  3  . Elastic Bandages & Supports (FUTURO SHEER SUPPORT HOSE) MISC 1 packet by Other route daily. 1 pair of support hose to wear daily      . esomeprazole (NEXIUM) 40 MG capsule Take 1 capsule (40 mg  total) by mouth daily before breakfast.  90 capsule  3  . furosemide (LASIX) 40 MG tablet Take 1 tablet (40 mg total) by mouth daily.  90 tablet  1  . glucose blood test strip Use as instructed      . Insulin Pen Needle (NOVOFINE) 32G X 6 MM MISC Inject 1.8 mg into the skin daily.  100 each  3  . Liraglutide (VICTOZA) 18 MG/3ML SOLN Inject 0.3 mLs (1.8 mg total) into the skin daily.  27 mL  3  . traMADol (ULTRAM) 50 MG tablet Take 1-2 tablets (50-100 mg total) by mouth every 6 (six) hours as needed for pain.  30 tablet  1   No current facility-administered medications on file prior to visit.    Past Surgical History  Procedure Laterality Date  . Abdominal hysterectomy    . Knee surgery      RIGHT KNEE SURGERY   . Upper gastrointestinal endoscopy  04/14/2006    Normal  . Colonoscopy  04/11/11    5 mm polyp removed but not recovered  . Laparoscopic appendectomy  06/10/2012    Procedure: APPENDECTOMY LAPAROSCOPIC;  Surgeon: Ardeth Sportsman, MD;  Location: [facility];  Service: General;  Laterality: N/A;    Allergies  Allergen Reactions  . Red Dye   . Tuna (Fish Allergy) Nausea  And Vomiting    History   Social History  . Marital Status: Married    Spouse Name: N/A    Number of Children: 0  . 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 Smoking status: Never Smoker   . Smokeless tobacco: Never Used  . Alcohol Use: No  . Drug Use: No  . Sexually Active: Yes -- Female partner(s)   Other Topics Concern  . Not on file   Social History Narrative  . No narrative on file    Family History  Problem Relation Age of Onset  . Hodgkin's lymphoma    . Hyperlipidemia Mother   . Hypertension Mother   . Cancer Mother 43    hodgkins lymphoma  . Diabetes Father   . Hyperlipidemia Brother   . Colon cancer Neg Hx   . Heart attack Neg Hx   . Sudden death Neg Hx   . Diabetes Paternal Grandmother     BP 168/99   Pulse 78  Ht 5\' 6"  (1.676 m)  Wt 348 lb (157.852 kg)  BMI 56.2 kg/m2  Review of Systems  See HPI above.    Objective:   Physical Exam  Gen: NAD Exam not repeated today. L knee: No gross deformity, ecchymoses, effusion.  Identification of joint lines difficult due to habitus. Mild TTP medial joint line.  No post patellar facet tenderness.  Minimal lateral joint line TTP. ROM 0 - 120 degrees. Negative ant/post drawers. Negative valgus/varus testing. Negative lachmanns. Negative apleys, mcmurrays. NV intact distally.     Assessment & Plan:  1. Left knee pain - Has known prior h/o DJD.  Her mechanism also would not suggest a new meniscal tear (occurred simply while walking).  Would like to try intraarticular injection which was given today after identifying lateral joint line, landmarks with ultrasound.  Continue HEP, naproxen.  Consider repeating her radiographs, MRI, PT if not improving.  After informed written consent, patient was seated on exam table. Left knee was prepped with alcohol swab and utilizing anterolateral approach, patient's left knee was injected intraarticularly with 3:1 marcaine: depomedrol. Patient tolerated the procedure well without immediate complications.

## 2012-09-15 NOTE — Assessment & Plan Note (Signed)
Discuss diet and exercise with pt Pt saw sport med today for injection of knee

## 2012-09-15 NOTE — Patient Instructions (Addendum)

## 2012-09-15 NOTE — Assessment & Plan Note (Signed)
Has known prior h/o DJD.  Her mechanism also would not suggest a new meniscal tear (occurred simply while walking).  Would like to try intraarticular injection which was given today after identifying lateral joint line, landmarks with ultrasound.  Continue HEP, naproxen.  Consider repeating her radiographs, MRI, PT if not improving.  After informed written consent, patient was seated on exam table. Left knee was prepped with alcohol swab and utilizing anterolateral approach, patient's left knee was injected intraarticularly with 3:1 marcaine: depomedrol. Patient tolerated the procedure well without immediate complications.

## 2012-09-16 ENCOUNTER — Ambulatory Visit: Payer: BC Managed Care – PPO | Admitting: Family Medicine

## 2012-10-08 ENCOUNTER — Ambulatory Visit: Payer: BC Managed Care – PPO | Admitting: Family Medicine

## 2012-10-15 ENCOUNTER — Ambulatory Visit: Payer: BC Managed Care – PPO | Admitting: Family Medicine

## 2012-10-22 ENCOUNTER — Other Ambulatory Visit (INDEPENDENT_AMBULATORY_CARE_PROVIDER_SITE_OTHER): Payer: BC Managed Care – PPO

## 2012-10-22 ENCOUNTER — Encounter: Payer: Self-pay | Admitting: Family Medicine

## 2012-10-22 ENCOUNTER — Ambulatory Visit (INDEPENDENT_AMBULATORY_CARE_PROVIDER_SITE_OTHER): Payer: BC Managed Care – PPO | Admitting: Family Medicine

## 2012-10-22 VITALS — BP 120/88 | HR 81 | Temp 98.0°F | Wt 346.8 lb

## 2012-10-22 DIAGNOSIS — K219 Gastro-esophageal reflux disease without esophagitis: Secondary | ICD-10-CM

## 2012-10-22 DIAGNOSIS — E739 Lactose intolerance, unspecified: Secondary | ICD-10-CM

## 2012-10-22 DIAGNOSIS — R112 Nausea with vomiting, unspecified: Secondary | ICD-10-CM

## 2012-10-22 DIAGNOSIS — E119 Type 2 diabetes mellitus without complications: Secondary | ICD-10-CM

## 2012-10-22 DIAGNOSIS — J302 Other seasonal allergic rhinitis: Secondary | ICD-10-CM

## 2012-10-22 DIAGNOSIS — J309 Allergic rhinitis, unspecified: Secondary | ICD-10-CM

## 2012-10-22 LAB — LIPID PANEL
Cholesterol: 158 mg/dL (ref 0–200)
VLDL: 11 mg/dL (ref 0.0–40.0)

## 2012-10-22 LAB — BASIC METABOLIC PANEL
BUN: 10 mg/dL (ref 6–23)
Creatinine, Ser: 0.9 mg/dL (ref 0.4–1.2)
GFR: 88.03 mL/min (ref >60.00)
Glucose, Bld: 90 mg/dL (ref 70–99)
Potassium: 3.8 mEq/L (ref 3.5–5.1)

## 2012-10-22 LAB — HEPATIC FUNCTION PANEL
ALT: 20 U/L (ref 0–35)
AST: 17 U/L (ref 0–37)
Albumin: 3.8 g/dL (ref 3.5–5.2)
Alkaline Phosphatase: 73 U/L (ref 39–117)

## 2012-10-22 LAB — POCT URINALYSIS DIPSTICK
Blood, UA: NEGATIVE
Ketones, UA: NEGATIVE
Protein, UA: NEGATIVE
Spec Grav, UA: 1.01
Urobilinogen, UA: 0.2
pH, UA: 7

## 2012-10-22 LAB — HEMOGLOBIN A1C: Hgb A1c MFr Bld: 6.1 % (ref 4.6–6.5)

## 2012-10-22 MED ORDER — LIRAGLUTIDE 18 MG/3ML ~~LOC~~ SOPN: 1.8000 mg | PEN_INJECTOR | Freq: Every day | SUBCUTANEOUS | Status: DC

## 2012-10-22 MED ORDER — ESOMEPRAZOLE MAGNESIUM 40 MG PO CPDR: 40.0000 mg | DELAYED_RELEASE_CAPSULE | Freq: Every day | ORAL | Status: DC

## 2012-10-22 NOTE — Assessment & Plan Note (Addendum)
Pt is avoiding dairy Pt still having nausea

## 2012-10-22 NOTE — Patient Instructions (Signed)

## 2012-10-22 NOTE — Progress Notes (Signed)
  Subjective:    Patient here for follow-up of elevated blood pressure.  She is exercising and is adherent to a low-salt diet.  Blood pressure is well controlled at home. Cardiac symptoms: lower extremity edema. Patient denies: chest pain, chest pressure/discomfort, claudication, dyspnea, exertional chest pressure/discomfort, fatigue, irregular heart beat, near-syncope, orthopnea, palpitations, paroxysmal nocturnal dyspnea, syncope and tachypnea. Cardiovascular risk factors: diabetes mellitus, dyslipidemia, hypertension and obesity (BMI >= 30 kg/m2). Use of agents associated with hypertension: none. History of target organ damage: none.  The following portions of the patient's history were reviewed and updated as appropriate: allergies, current medications, past family history, past medical history, past social history, past surgical history and problem list.  Review of Systems Pertinent items are noted in HPI.     Objective:    BP 120/88  Pulse 81  Temp(Src) 98 F (36.7 C) (Oral)  Wt 346 lb 12.8 oz (157.307 kg)  BMI 56 kg/m2  SpO2 97% General appearance: alert, cooperative, appears stated age and no distress Throat: lips, mucosa, and tongue normal; teeth and gums normal Neck: no adenopathy, no carotid bruit, no JVD, supple, symmetrical, trachea midline and thyroid not enlarged, symmetric, no tenderness/mass/nodules Lungs: clear to auscultation bilaterally Heart: S1, S2 normal Extremities: edema + 1 pitting    Assessment:    Hypertension, normal blood pressure . Evidence of target organ damage: none.    Plan:    Medication: no change. Dietary sodium restriction. Regular aerobic exercise. Check blood pressures 2-3 times weekly and record. Follow up: 6 months and as needed.

## 2012-10-23 LAB — MICROALBUMIN / CREATININE URINE RATIO: Microalb, Ur: 0.58 mg/dL (ref 0.00–1.89)

## 2012-10-25 LAB — ~~LOC~~ ALLERGY PANEL
Allergen, Comm Silver Birch, t9: <0.1 kU/L
Allergen, D pternoyssinus,d7: <0.1 kU/L
Alternaria Alternata: <0.1 kU/L
Aspergillus fumigatus, m3: <0.1 kU/L
Bermuda Grass: <0.1 kU/L
Box Elder IgE: <0.1 kU/L
Cat Dander: <0.1 kU/L
Cockroach: <0.1 kU/L
Common Ragweed: <0.1 kU/L
D. farinae: <0.1 kU/L
Dog Dander: <0.1 kU/L
Elm IgE: <0.1 kU/L
Mucor Racemosus: <0.1 kU/L
Mugwort: <0.1 kU/L
Rough Pigweed  IgE: <0.1 kU/L
Sheep Sorrel IgE: <0.1 kU/L
Stemphylium Botryosum: <0.1 kU/L
Sweet Gum: <0.1 kU/L
Timothy Grass: <0.1 kU/L

## 2012-10-27 ENCOUNTER — Telehealth: Payer: Self-pay | Admitting: Internal Medicine

## 2012-10-28 ENCOUNTER — Encounter: Payer: Self-pay | Admitting: Internal Medicine

## 2012-10-28 NOTE — Telephone Encounter (Signed)
I spoke with Luster Landsberg, she will discuss with Dr. Laury Axon why patient needs seen, there is no info in the office note.  She will call back

## 2012-10-28 NOTE — Assessment & Plan Note (Signed)
Increasing symptoms Refer to gi

## 2012-10-28 NOTE — Telephone Encounter (Signed)
Per Luster Landsberg she does not need an early appt, she asked that I disregard request

## 2012-11-08 ENCOUNTER — Telehealth: Payer: Self-pay

## 2012-11-08 NOTE — Telephone Encounter (Signed)
Patient called to make Dr.Lowne aware she will not be able to do the body by Vi and wanted to know if Medifast will work and when she will need to follow up. Per Dr.Lowne it is ok to take the Medifast weight loss supplement, and follow up Prn. Patient voiced understanding and agreed.     KP

## 2012-12-02 ENCOUNTER — Ambulatory Visit: Payer: BC Managed Care – PPO | Admitting: Internal Medicine

## 2012-12-06 ENCOUNTER — Other Ambulatory Visit: Payer: Self-pay | Admitting: *Deleted

## 2012-12-06 NOTE — Telephone Encounter (Signed)
Error

## 2012-12-09 ENCOUNTER — Telehealth: Payer: Self-pay | Admitting: *Deleted

## 2012-12-09 ENCOUNTER — Other Ambulatory Visit: Payer: Self-pay | Admitting: *Deleted

## 2012-12-09 ENCOUNTER — Telehealth: Payer: Self-pay | Admitting: Family Medicine

## 2012-12-09 DIAGNOSIS — E119 Type 2 diabetes mellitus without complications: Secondary | ICD-10-CM

## 2012-12-09 DIAGNOSIS — E785 Hyperlipidemia, unspecified: Secondary | ICD-10-CM

## 2012-12-09 DIAGNOSIS — K219 Gastro-esophageal reflux disease without esophagitis: Secondary | ICD-10-CM

## 2012-12-09 DIAGNOSIS — I10 Essential (primary) hypertension: Secondary | ICD-10-CM

## 2012-12-09 MED ORDER — LIRAGLUTIDE 18 MG/3ML ~~LOC~~ SOPN: 1.8000 mg | PEN_INJECTOR | Freq: Every day | SUBCUTANEOUS | Status: DC

## 2012-12-09 MED ORDER — INSULIN PEN NEEDLE 32G X 6 MM MISC: 1.8000 mg | Freq: Every day | Status: DC

## 2012-12-09 MED ORDER — ESOMEPRAZOLE MAGNESIUM 40 MG PO CPDR: 40.0000 mg | DELAYED_RELEASE_CAPSULE | Freq: Every day | ORAL | Status: DC

## 2012-12-09 MED ORDER — BENAZEPRIL HCL 40 MG PO TABS: 40.0000 mg | ORAL_TABLET | Freq: Every day | ORAL | Status: DC

## 2012-12-09 MED ORDER — ATORVASTATIN CALCIUM 40 MG PO TABS: 40.0000 mg | ORAL_TABLET | Freq: Every day | ORAL | Status: DC

## 2012-12-09 NOTE — Telephone Encounter (Signed)
Pharmacy is requesting refills for several medications however patient has had most of these medication filled during her last visit 10/22/2012 I have called [facility] and they stated that patient is requesting her refills from them.  Called patient to ask where did you get her scripts from in June however had to leave a voice mail for patient to call us back need clarification. AG CMA

## 2012-12-09 NOTE — Telephone Encounter (Signed)
All Rx requested has been refilled and sent to [facility].  Benazepril has been sent to [facility] with #30 no refills.  Ag cma

## 2012-12-09 NOTE — Telephone Encounter (Signed)
Pt returned your call.  

## 2012-12-09 NOTE — Telephone Encounter (Signed)
Patient returned my call and stated the she never picked her medications from [facility] because her insurance would not allow them to go there if they are # 90. She ask that all medication requested be sent to [facility] except for Benazepril she needs right away so it will be sent to [facility] for a # 30 0 R.   Ag cma

## 2012-12-13 ENCOUNTER — Ambulatory Visit (INDEPENDENT_AMBULATORY_CARE_PROVIDER_SITE_OTHER): Payer: BC Managed Care – PPO | Admitting: Internal Medicine

## 2012-12-13 ENCOUNTER — Encounter: Payer: Self-pay | Admitting: Internal Medicine

## 2012-12-13 VITALS — BP 148/90 | HR 68 | Ht 65.0 in | Wt 352.4 lb

## 2012-12-13 DIAGNOSIS — R1110 Vomiting, unspecified: Secondary | ICD-10-CM

## 2012-12-13 HISTORY — DX: Morbid (severe) obesity due to excess calories: E66.01

## 2012-12-13 NOTE — Assessment & Plan Note (Addendum)
Has had x many yrs. Initial GI eval 2007 negative EGD and SBFT Cause not clear. She wonders about stress induced issues and certainly possible. Wants to observe at this time and that is ok Her Hgb A1 C is good and has been and she has always had episodies separated x weeks-months so doubt gastroparesis. Could have cyclic vomiting syndrome. Is on an ACEI and angioedema something to keep in mind. Victoza could also cause pain and vomiting but she was not on that in 2007, I think.

## 2012-12-13 NOTE — Patient Instructions (Addendum)
The phone number for [facility] is [phone].  Call and ask about their Bariatric program.     I appreciate the opportunity to care for you.

## 2012-12-13 NOTE — Progress Notes (Signed)
  Subjective:    Patient ID: Tammy [name], female    [name]: [birth year], 52 y.o.   MRN: [id number]  HPI The patient is here because of vomiting. I had seen her for similar problems of intermittent episodic vomiting in 2007. Had negative EGD and SBFT then. She had forgotten that. She was having episodes of epigastric discomfort followed by vomiting over past year. Hours after eating usually and vomited food. In between ok w/o sxs. Was under stress as husband had open heart surgery and was off work - she was handling all bills and was stressed about possible layoff at [employer].She also ended up having a CT that showed chronic appendicitis - had appy in Feb (Dr. Michaell Cowing) and no vomiting for 4 months (last episode in June) which was best in long time. Also had a left ovarian cyst at [name] and appy.  Since last episode in June she has decided not to worry about works and husband is back at work and things less stressful. She thinks stress may be playing a role.  She had a colonoscopy with polyp removed but not recovered in 2012  We also discussed her co-morbidities including obesity. She has tried multiple diets but has never been successful. She has thought about bariatric surgery but has never sought more info.   Medications, allergies, past medical history, past surgical history, family history and social history are reviewed and updated in the EMR.  Review of Systems As abobe    Objective:   Physical Exam General:  Pleasant obese black woman, NAD Eyes:  anicteric. Lungs: Clear to auscultation bilaterally. Heart:  S1S2, no rubs, murmurs, gallops. Abdomen:  soft, non-tender, no hepatosplenomegaly, hernia, or mass and BS+. No succussion splash Extremities:   Trace LE edema, obese thighs and buttocks Skin   no rash. Neuro:  A&O x 3.  Psych:  appropriate mood and  Affect.   Data Reviewed: Op note 06/2012 06/2012 CT, [name], hospital notes PCP notes 2007 GI evaluation (me) Lab Results  Component  Value Date   WBC 3.7* 06/10/2012   HGB 13.0 06/10/2012   HCT 38.7 06/10/2012   MCV 86.6 06/10/2012   PLT 274 06/10/2012   Lab Results  Component Value Date   AMYLASE 58 06/09/2012  ; Lab Results  Component Value Date   ALT 20 10/22/2012   AST 17 10/22/2012   ALKPHOS 73 10/22/2012   BILITOT 1.2 10/22/2012   Lab Results  Component Value Date   LIPASE 39 06/10/2012       Assessment & Plan:  Chronic vomiting  Morbid obesity

## 2012-12-13 NOTE — Assessment & Plan Note (Signed)
We discussed that bariatric surgery is a reasonable option given her BMI and co-morbidities. She was given # to [facility] to call about this and to consider her options of  Lap band, gastric bypass or sleeve. She might need another EGD or other studies if vomiting persists, prior to any surgery.

## 2013-01-10 ENCOUNTER — Telehealth: Payer: Self-pay | Admitting: Family Medicine

## 2013-01-10 DIAGNOSIS — I10 Essential (primary) hypertension: Secondary | ICD-10-CM

## 2013-01-10 MED ORDER — BENAZEPRIL HCL 40 MG PO TABS: 40.0000 mg | ORAL_TABLET | Freq: Every day | ORAL | Status: DC

## 2013-01-10 NOTE — Telephone Encounter (Signed)
Called patient has been notified that benazepril.  Ag cma

## 2013-01-10 NOTE — Telephone Encounter (Signed)
Patient called requesting rx for benazepril and tramadol be sent to [facility].

## 2013-01-26 ENCOUNTER — Encounter: Payer: Self-pay | Admitting: *Deleted

## 2013-01-26 ENCOUNTER — Telehealth: Payer: Self-pay | Admitting: *Deleted

## 2013-01-26 DIAGNOSIS — Z0279 Encounter for issue of other medical certificate: Secondary | ICD-10-CM

## 2013-01-26 NOTE — Telephone Encounter (Signed)
Forms from [facility] completed along with letter of medical necessity printed.

## 2013-01-27 ENCOUNTER — Telehealth: Payer: Self-pay | Admitting: *Deleted

## 2013-01-27 NOTE — Telephone Encounter (Signed)
Spoke with patient wants copy of paperwork mailed to her, this was done. Copy of paperwork faxed to [facility].

## 2013-02-02 ENCOUNTER — Other Ambulatory Visit: Payer: Self-pay

## 2013-02-02 DIAGNOSIS — Z1231 Encounter for screening mammogram for malignant neoplasm of breast: Secondary | ICD-10-CM

## 2013-02-03 ENCOUNTER — Ambulatory Visit (INDEPENDENT_AMBULATORY_CARE_PROVIDER_SITE_OTHER): Payer: BC Managed Care – PPO | Admitting: Surgery

## 2013-02-09 ENCOUNTER — Ambulatory Visit: Payer: BC Managed Care – PPO | Admitting: Family Medicine

## 2013-02-11 ENCOUNTER — Encounter: Payer: Self-pay | Admitting: Family Medicine

## 2013-02-11 ENCOUNTER — Ambulatory Visit (INDEPENDENT_AMBULATORY_CARE_PROVIDER_SITE_OTHER): Payer: BC Managed Care – PPO | Admitting: Family Medicine

## 2013-02-11 VITALS — BP 171/94 | HR 69 | Ht 66.0 in | Wt 352.0 lb

## 2013-02-11 DIAGNOSIS — M25569 Pain in unspecified knee: Secondary | ICD-10-CM

## 2013-02-11 DIAGNOSIS — M25562 Pain in left knee: Secondary | ICD-10-CM

## 2013-02-11 NOTE — Patient Instructions (Signed)
Take tylenol 500mg  1-2 tabs three times a day for pain. Aleve 1-2 tabs twice a day with food Glucosamine sulfate 750mg  twice a day is a supplement that may help. Capsaicin topically up to four times a day may also help with pain. Cortisone injections are an option - you were given one today. If cortisone injections do not help, there are different types of shots that may help but they take longer to take effect. It's important that you continue to stay active. If you are overweight, try to lose weight through diet and exercise. Straight leg raises, knee extensions 3 sets of 10 once a day (add ankle weight if these become too easy). Consider physical therapy to strengthen muscles around the joint that hurts to take pressure off of the joint itself. Shoe inserts with good arch support may be helpful. Walker or cane if needed. Heat or ice 15 minutes at a time 3-4 times a day as needed to help with pain. Water aerobics and cycling with low resistance are the best two types of exercise for arthritis. If not improving over next 1-2 weeks call me and I would start by just ordering x-rays, call you with results and discuss next steps.

## 2013-02-14 ENCOUNTER — Encounter: Payer: Self-pay | Admitting: Family Medicine

## 2013-02-14 ENCOUNTER — Ambulatory Visit: Payer: Self-pay | Admitting: Podiatry

## 2013-02-14 NOTE — Progress Notes (Signed)
Patient ID: Tammy [name], female   [name]: 01/19/1961, 52 y.o.   MRN: [id number]  Subjective:    Patient ID: Tammy [name], female    [name]: 1961/03/26, 52 y.o.   MRN: [id number]  PCP: Dr. Laury Axon  HPI 52 yo F here for f/u left knee pain.  4/2: Patient reports 2 weeks ago she was walking up some steps when her left knee gave a little and she felt a pop within her knee. Did not hurt right away - started to get more sore as time went on. Has been icing, heating knee. Has improved some recently. + swelling and achy pain. Is on feet a lot for work. No catching or locking. Taking naproxen. About 6 years ago was told she had arthritis in left knee and actually had cortisone shots which helped - no real problems since.  4/30: Patient returns stating she is better from last visit. Varies in improvement between 20-70% based on the day. Weather changes make this feel worse. Has been taking naproxen, doing home exercises, alternating ice and heat. Pain is worse after work. No catching, locking.  5/14: Patient returns today for an intraarticular cortisone injection of left knee.  10/10: Patient states over past 1-2 weeks pain has started to recur deep in left knee. Some slight swelling. Icing but not taking any medicines for this. No catching, locking, giving out though knee feels heavy.  Past Medical History  Diagnosis Date  . GERD (gastroesophageal reflux disease)   . Hypertension   . Hyperlipidemia   . Diabetes mellitus     TYPE 2  . Pneumonia   . Sinusitis   . Acute on chronic appendicitis s/p lap appy 06/10/2012 06/10/2012  . Morbid obesity - BMI > 50 12/13/2012    Current Outpatient Prescriptions on File Prior to Visit  Medication Sig Dispense Refill  . atorvastatin (LIPITOR) 40 MG tablet Take 1 tablet (40 mg total) by mouth daily.  90 tablet  3  . benazepril (LOTENSIN) 40 MG tablet Take 1 tablet (40 mg total) by mouth daily.  90 tablet  0  . Elastic Bandages & Supports  (FUTURO SHEER SUPPORT HOSE) MISC 1 packet by Other route daily. 1 pair of support hose to wear daily      . esomeprazole (NEXIUM) 40 MG capsule Take 1 capsule (40 mg total) by mouth daily before breakfast.  90 capsule  3  . fluticasone (FLONASE) 50 MCG/ACT nasal spray Place 2 sprays into the nose daily.  16 g  5  . furosemide (LASIX) 40 MG tablet Take 1 tablet (40 mg total) by mouth daily.  90 tablet  1  . glucose blood test strip Use as instructed      . Insulin Pen Needle (NOVOFINE) 32G X 6 MM MISC Inject 1.8 mg into the skin daily.  100 each  3  . Liraglutide (VICTOZA) 18 MG/3ML SOPN Inject 1.8 mg into the skin daily.  27 mL  3  . traMADol (ULTRAM) 50 MG tablet Take 1-2 tablets (50-100 mg total) by mouth every 6 (six) hours as needed for pain.  30 tablet  1   No current facility-administered medications on file prior to visit.    Past Surgical History  Procedure Laterality Date  . Abdominal hysterectomy    . Knee surgery Right      KNEE SURGERY   . Upper gastrointestinal endoscopy  04/14/2006    Normal  . Colonoscopy  04/11/11    5 mm polyp removed  but not recovered  . Laparoscopic appendectomy  06/10/2012    Procedure: APPENDECTOMY LAPAROSCOPIC;  Surgeon: Ardeth Sportsman, MD;  Location: [facility];  Service: General;  Laterality: N/A;    Allergies  Allergen Reactions  . Red Dye   . Prescott Gum [Fish Allergy] Nausea And Vomiting    History   Social History  . Marital Status: Married    Spouse Name: N/A    Number of Children: 0  . 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 Smoking status: Never Smoker   . Smokeless tobacco: Never Used  . Alcohol Use: No  . Drug Use: No  . Sexual Activity: Yes    Partners: Male   Other Topics Concern  . Not on file   Social History Narrative  . No narrative on file    Family History  Problem Relation Age of Onset  . Hyperlipidemia Mother   . Hypertension Mother    . Cancer Mother 47    hodgkins lymphoma  . Diabetes Father   . Hyperlipidemia Brother   . Colon cancer Neg Hx   . Heart attack Neg Hx   . Sudden death Neg Hx   . Diabetes Paternal Grandmother     BP 171/94  Pulse 69  Ht 5\' 6"  (1.676 m)  Wt 352 lb (159.666 kg)  BMI 56.84 kg/m2  Review of Systems See HPI above.    Objective:   Physical Exam Gen: NAD  L knee: No gross deformity, ecchymoses, effusion.  Identification of joint lines difficult due to habitus. TTP medial joint line.  No post patellar facet tenderness.  Minimal lateral joint line TTP. ROM 0 - 120 degrees. Negative ant/post drawers. Negative valgus/varus testing. Negative lachmanns. Negative apleys, mcmurrays. NV intact distally.    Assessment & Plan:  1. Left knee pain - Has known prior h/o DJD.  Concerning that last injection only helped for 4 months.  Will move forward with repeat injection today.  Continue HEP.  NSAIDs prn.  Discussed repeating radiographs but she declined at this time.   After informed written consent, patient was seated on exam table. Ultrasound used to identify lateral joint space then left knee was prepped with alcohol swab and utilizing anterolateral approach, patient's left knee was injected intraarticularly with 3:1 marcaine: depomedrol. Patient tolerated the procedure well without immediate complications.

## 2013-02-14 NOTE — Assessment & Plan Note (Signed)
Has known prior h/o DJD.  Concerning that last injection only helped for 4 months.  Will move forward with repeat injection today.  Continue HEP.  NSAIDs prn.  Discussed repeating radiographs but she declined at this time.   After informed written consent, patient was seated on exam table. Ultrasound used to identify lateral joint space then left knee was prepped with alcohol swab and utilizing anterolateral approach, patient's left knee was injected intraarticularly with 3:1 marcaine: depomedrol. Patient tolerated the procedure well without immediate complications.

## 2013-02-16 ENCOUNTER — Ambulatory Visit
Admission: RE | Admit: 2013-02-16 | Discharge: 2013-02-16 | Disposition: A | Discharge status: Home / Self Care | Payer: BC Managed Care – PPO | Source: Ambulatory Visit

## 2013-02-16 DIAGNOSIS — Z1231 Encounter for screening mammogram for malignant neoplasm of breast: Secondary | ICD-10-CM

## 2013-02-17 ENCOUNTER — Ambulatory Visit (INDEPENDENT_AMBULATORY_CARE_PROVIDER_SITE_OTHER): Payer: BC Managed Care – PPO | Admitting: Podiatry

## 2013-02-17 ENCOUNTER — Encounter: Payer: Self-pay | Admitting: Podiatry

## 2013-02-17 VITALS — BP 169/98 | HR 74 | Resp 16 | Ht 66.0 in | Wt 325.0 lb

## 2013-02-17 DIAGNOSIS — M722 Plantar fascial fibromatosis: Secondary | ICD-10-CM

## 2013-02-17 MED ORDER — TRIAMCINOLONE ACETONIDE 10 MG/ML IJ SUSP
5.0000 mg | Freq: Once | INTRAMUSCULAR | Status: AC
  Administered 2013-02-17: 5 mg via INTRA_ARTICULAR

## 2013-02-17 NOTE — Progress Notes (Signed)
Subjective:     Patient ID: Tammy [name], female   [name]: 06-15-1960, 52 y.o.   MRN: [id number]  HPI patient states my left heel is still very sore. Patient is morbidly obese which is a complicating factor but she states she is scheduled to have bariatric surgery she needs to be able to exercise but due to the pain in her heel it is difficult   Review of Systems  All other systems reviewed and are negative.       Objective:   Physical Exam  Nursing note and vitals reviewed. Constitutional: She appears well-developed.  Cardiovascular: Intact distal pulses.   Skin: Skin is warm.   Patient's left heel remains tender in the medial and central band of the fascia    Assessment:     Continued plantar fasciitis left with obesity as complicating factor    Plan:     Reviewed condition and I did today inject one more time with 3 mg Kenalog 5 mg Xylocaine Marcaine mixture. I applied a gair fracture walker to completely reduce stress on the plantar heel and discussed that ultimately she may require surgery if we cannot control symptom. Reappoint in 3 weeks for evaluation

## 2013-02-17 NOTE — Patient Instructions (Signed)
Wear boot as much as possible with walking

## 2013-02-21 ENCOUNTER — Ambulatory Visit: Payer: Self-pay | Admitting: Podiatry

## 2013-02-23 ENCOUNTER — Ambulatory Visit: Payer: Self-pay | Admitting: Podiatry

## 2013-03-10 ENCOUNTER — Other Ambulatory Visit: Payer: Self-pay

## 2013-03-17 ENCOUNTER — Ambulatory Visit: Payer: BC Managed Care – PPO | Admitting: Podiatry

## 2013-03-17 ENCOUNTER — Ambulatory Visit (INDEPENDENT_AMBULATORY_CARE_PROVIDER_SITE_OTHER): Payer: BC Managed Care – PPO | Admitting: Podiatry

## 2013-03-17 ENCOUNTER — Encounter: Payer: Self-pay | Admitting: Podiatry

## 2013-03-17 VITALS — BP 112/84 | HR 77 | Resp 12

## 2013-03-17 DIAGNOSIS — M722 Plantar fascial fibromatosis: Secondary | ICD-10-CM

## 2013-03-18 NOTE — Progress Notes (Signed)
Subjective:     Patient ID: Tammy [name], female   [name]: Sep 24, 1960, 52 y.o.   MRN: [id number]  HPI patient presents stating that my heel is really feeling better since we started using the air fracture walker   Review of Systems     Objective:   Physical Exam  Nursing note and vitals reviewed. Constitutional: She is oriented to person, place, and time.  Cardiovascular: Intact distal pulses.   Musculoskeletal: Normal range of motion.  Neurological: She is oriented to person, place, and time.   patient is obese which is certainly a complicating factor but is found to have significant reduction of heel pain left     Assessment:     Plantar fasciitis left which seems to be improving with immobilization    Plan:     Continued immobilization and then gradually reduced in approximately 4 weeks. Very hopeful that this will keep her symptoms under control and will reappoint if they do recur

## 2013-05-18 ENCOUNTER — Other Ambulatory Visit: Payer: Self-pay | Admitting: Family Medicine

## 2013-06-23 ENCOUNTER — Ambulatory Visit (INDEPENDENT_AMBULATORY_CARE_PROVIDER_SITE_OTHER): Payer: BC Managed Care – PPO | Admitting: Podiatry

## 2013-06-23 ENCOUNTER — Ambulatory Visit (INDEPENDENT_AMBULATORY_CARE_PROVIDER_SITE_OTHER): Payer: BC Managed Care – PPO

## 2013-06-23 ENCOUNTER — Encounter: Payer: Self-pay | Admitting: Podiatry

## 2013-06-23 VITALS — BP 143/83 | HR 72 | Resp 18

## 2013-06-23 DIAGNOSIS — M722 Plantar fascial fibromatosis: Secondary | ICD-10-CM

## 2013-06-23 MED ORDER — TRIAMCINOLONE ACETONIDE 10 MG/ML IJ SUSP
10.0000 mg | Freq: Once | INTRAMUSCULAR | Status: AC
  Administered 2013-06-23: 10 mg

## 2013-06-23 NOTE — Progress Notes (Signed)
Subjective:     Patient ID: Tammy [name], female   [name]: October 11, 1960, 53 y.o.   MRN: [id number]  HPI patient presents stating my left heel is doing pretty well but now my right heel has started to hurt as I been trying to increase my activity to lose weight so I can have bariatric surgery in the future   Review of Systems     Objective:   Physical Exam Neurovascular status intact with severe obesity and discomfort in the plantar heel and arch of the right foot    Assessment:     Improving left heel with plantar fasciitis which has occurred in the right heel    Plan:     X-ray right reviewed and today I injected right plantar fascia 3 mg Kenalog 5 mg Xylocaine Marcaine mixture and discussed physical therapy and night splint usage

## 2013-06-23 NOTE — Progress Notes (Signed)
° °  Subjective:    Patient ID: Tammy [name], female    [name]: 1961-04-22, 53 y.o.   MRN: [id number]  HPI My left heel is hurting again after I got a new pair of shoes and I have worn my boot and it did feel better and  my right heel is starting to hurt and has been going on for 3 weeks and I brought new shoes and is sore and tender and I think that I may need some new inserts    Review of Systems     Objective:   Physical Exam        Assessment & Plan:

## 2013-07-08 ENCOUNTER — Encounter: Payer: Self-pay | Admitting: Family Medicine

## 2013-07-08 ENCOUNTER — Ambulatory Visit (INDEPENDENT_AMBULATORY_CARE_PROVIDER_SITE_OTHER): Payer: BC Managed Care – PPO | Admitting: Family Medicine

## 2013-07-08 VITALS — BP 120/82 | HR 119 | Temp 98.6°F | Wt 333.8 lb

## 2013-07-08 DIAGNOSIS — K529 Noninfective gastroenteritis and colitis, unspecified: Secondary | ICD-10-CM

## 2013-07-08 DIAGNOSIS — K5289 Other specified noninfective gastroenteritis and colitis: Secondary | ICD-10-CM

## 2013-07-08 LAB — CBC WITH DIFFERENTIAL/PLATELET
BASOS ABS: 0 10*3/uL (ref 0.0–0.1)
Basophils Relative: 0.2 % (ref 0.0–3.0)
EOS ABS: 0 10*3/uL (ref 0.0–0.7)
Eosinophils Relative: 0.1 % (ref 0.0–5.0)
HCT: 45.9 % (ref 36.0–46.0)
HEMOGLOBIN: 15.1 g/dL — AB (ref 12.0–15.0)
LYMPHS ABS: 0.9 10*3/uL (ref 0.7–4.0)
LYMPHS PCT: 13.2 % (ref 12.0–46.0)
MCHC: 32.9 g/dL (ref 30.0–36.0)
MCV: 87.6 fl (ref 78.0–100.0)
Monocytes Absolute: 0.2 10*3/uL (ref 0.1–1.0)
Monocytes Relative: 2.9 % — ABNORMAL LOW (ref 3.0–12.0)
Neutro Abs: 5.5 10*3/uL (ref 1.4–7.7)
Neutrophils Relative %: 83.6 % — ABNORMAL HIGH (ref 43.0–77.0)
PLATELETS: 290 10*3/uL (ref 150.0–400.0)
RBC: 5.24 Mil/uL — ABNORMAL HIGH (ref 3.87–5.11)
RDW: 14.4 % (ref 11.5–14.6)
WBC: 6.5 10*3/uL (ref 4.5–10.5)

## 2013-07-08 LAB — POCT URINALYSIS DIPSTICK
BILIRUBIN UA: NEGATIVE
Blood, UA: NEGATIVE
GLUCOSE UA: NEGATIVE
Leukocytes, UA: NEGATIVE
Nitrite, UA: NEGATIVE
Protein, UA: 30
Spec Grav, UA: 1.015
Urobilinogen, UA: 0.2
pH, UA: 6

## 2013-07-08 LAB — HEPATIC FUNCTION PANEL
ALBUMIN: 3.8 g/dL (ref 3.5–5.2)
ALK PHOS: 63 U/L (ref 39–117)
ALT: 17 U/L (ref 0–35)
AST: 16 U/L (ref 0–37)
Bilirubin, Direct: 0.1 mg/dL (ref 0.0–0.3)
Total Bilirubin: 0.6 mg/dL (ref 0.3–1.2)
Total Protein: 7.3 g/dL (ref 6.0–8.3)

## 2013-07-08 LAB — BASIC METABOLIC PANEL
BUN: 9 mg/dL (ref 6–23)
CALCIUM: 8.9 mg/dL (ref 8.4–10.5)
CO2: 23 mEq/L (ref 19–32)
CREATININE: 1 mg/dL (ref 0.4–1.2)
Chloride: 104 mEq/L (ref 96–112)
GFR: 75.63 mL/min (ref >60.00)
GLUCOSE: 94 mg/dL (ref 70–99)
Potassium: 3.6 mEq/L (ref 3.5–5.1)
Sodium: 138 mEq/L (ref 135–145)

## 2013-07-08 MED ORDER — PROMETHAZINE HCL 25 MG/ML IJ SOLN
50.0000 mg | Freq: Once | INTRAMUSCULAR | Status: AC
  Administered 2013-07-08: 50 mg via INTRAMUSCULAR

## 2013-07-08 MED ORDER — ONDANSETRON HCL 8 MG PO TABS: 8.0000 mg | ORAL_TABLET | Freq: Three times a day (TID) | ORAL | Status: DC | PRN

## 2013-07-08 NOTE — Progress Notes (Signed)
Pre visit review using our clinic review tool, if applicable. No additional management support is needed unless otherwise documented below in the visit note. 

## 2013-07-08 NOTE — Progress Notes (Signed)
  Subjective:     Tammy [name] is a 53 y.o. female who presents for evaluation of nonbilious vomiting 3 times per day, aching pain located in in the entire abdomen, diarrhea 6 times per day, fever and nausea. Symptoms have been present for 1 day. Patient denies blood in stool, constipation, dysuria, heartburn, hematemesis, hematuria and melena. Patient's oral intake has been decreased for liquids and decreased for solids. Patient's urine output has been decreased with 12 voids in 24 hours, the last void was 9 hours ago. Other contacts with similar symptoms include: none. Patient denies recent travel history. Patient has not had recent ingestion of possible contaminated food, toxic plants, or inappropriate medications/poisons.  Pt took victoza when BS 82 and she became sick after that.    The following portions of the patient's history were reviewed and updated as appropriate: allergies, current medications, past family history, past medical history, past social history, past surgical history and problem list.  Review of Systems Pertinent items are noted in HPI.    Objective:     BP 120/82  Pulse 119  Temp(Src) 98.6 F (37 C) (Oral)  Wt 333 lb 12.8 oz (151.411 kg)  SpO2 97% General appearance: alert, cooperative, appears stated age and no distress Ears: normal TM's and external ear canals both ears Nose: Nares normal. Septum midline. Mucosa normal. No drainage or sinus tenderness. Throat: lips, mucosa, and tongue normal; teeth and gums normal Neck: no adenopathy, no carotid bruit, no JVD, supple, symmetrical, trachea midline and thyroid not enlarged, symmetric, no tenderness/mass/nodules Lungs: clear to auscultation bilaterally Heart: regular rate and rhythm, S1, S2 normal, no murmur, click, rub or gallop Abdomen: abnormal findings:  mild tenderness in the entire abdomen Extremities: extremities normal, atraumatic, no cyanosis or edema    Assessment:    Acute Gastroenteritis     Plan:    1. Discussed oral rehydration, reintroduction of solid foods, signs of dehydration. 2. Return or go to emergency department if worsening symptoms, blood or bile, signs of dehydration, diarrhea lasting longer than 5 days or any new concerns. 3. Follow up in a few days or sooner as needed.

## 2013-07-08 NOTE — Patient Instructions (Signed)
Viral Gastroenteritis Viral gastroenteritis is also known as stomach flu. This condition affects the stomach and intestinal tract. It can cause sudden diarrhea and vomiting. The illness typically lasts 3 to 8 days. Most people develop an immune response that eventually gets rid of the virus. While this natural response develops, the virus can make you quite ill. CAUSES  Many different viruses can cause gastroenteritis, such as rotavirus or noroviruses. You can catch one of these viruses by consuming contaminated food or water. You may also catch a virus by sharing utensils or other personal items with an infected person or by touching a contaminated surface. SYMPTOMS  The most common symptoms are diarrhea and vomiting. These problems can cause a severe loss of body fluids (dehydration) and a body salt (electrolyte) imbalance. Other symptoms may include:  Fever.  Headache.  Fatigue.  Abdominal pain. DIAGNOSIS  Your caregiver can usually diagnose viral gastroenteritis based on your symptoms and a physical exam. A stool sample may also be taken to test for the presence of viruses or other infections. TREATMENT  This illness typically goes away on its own. Treatments are aimed at rehydration. The most serious cases of viral gastroenteritis involve vomiting so severely that you are not able to keep fluids down. In these cases, fluids must be given through an intravenous line (IV). HOME CARE INSTRUCTIONS   Drink enough fluids to keep your urine clear or pale yellow. Drink small amounts of fluids frequently and increase the amounts as tolerated.  Ask your caregiver for specific rehydration instructions.  Avoid:  Foods high in sugar.  Alcohol.  Carbonated drinks.  Tobacco.  Juice.  Caffeine drinks.  Extremely hot or cold fluids.  Fatty, greasy foods.  Too much intake of anything at one time.  Dairy products until 24 to 48 hours after diarrhea stops.  You may consume probiotics.  Probiotics are active cultures of beneficial bacteria. They may lessen the amount and number of diarrheal stools in adults. Probiotics can be found in yogurt with active cultures and in supplements.  Wash your hands well to avoid spreading the virus.  Only take over-the-counter or prescription medicines for pain, discomfort, or fever as directed by your caregiver. Do not give aspirin to children. Antidiarrheal medicines are not recommended.  Ask your caregiver if you should continue to take your regular prescribed and over-the-counter medicines.  Keep all follow-up appointments as directed by your caregiver. SEEK IMMEDIATE MEDICAL CARE IF:   You are unable to keep fluids down.  You do not urinate at least once every 6 to 8 hours.  You develop shortness of breath.  You notice blood in your stool or vomit. This may look like coffee grounds.  You have abdominal pain that increases or is concentrated in one small area (localized).  You have persistent vomiting or diarrhea.  You have a fever.  The patient is a child younger than 3 months, and he or she has a fever.  The patient is a child older than 3 months, and he or she has a fever and persistent symptoms.  The patient is a child older than 3 months, and he or she has a fever and symptoms suddenly get worse.  The patient is a baby, and he or she has no tears when crying. MAKE SURE YOU:   Understand these instructions.  Will watch your condition.  Will get help right away if you are not doing well or get worse. Document Released: 04/21/2005 Document Revised: 07/14/2011 Document Reviewed: 02/05/2011   ExitCare Patient Information 2014 [employer].  

## 2013-08-11 ENCOUNTER — Other Ambulatory Visit: Payer: Self-pay

## 2013-08-12 ENCOUNTER — Telehealth: Payer: Self-pay | Admitting: Family Medicine

## 2013-08-12 DIAGNOSIS — K219 Gastro-esophageal reflux disease without esophagitis: Secondary | ICD-10-CM

## 2013-08-12 MED ORDER — LIRAGLUTIDE 18 MG/3ML ~~LOC~~ SOPN: 1.8000 mg | PEN_INJECTOR | Freq: Every day | SUBCUTANEOUS | Status: DC

## 2013-08-12 MED ORDER — BENAZEPRIL HCL 40 MG PO TABS: ORAL_TABLET | ORAL | Status: DC

## 2013-08-12 MED ORDER — ESOMEPRAZOLE MAGNESIUM 40 MG PO CPDR: 40.0000 mg | DELAYED_RELEASE_CAPSULE | Freq: Every day | ORAL | Status: DC

## 2013-08-12 NOTE — Telephone Encounter (Signed)
Med's have been sent.      KP 

## 2013-08-12 NOTE — Telephone Encounter (Signed)
4.10.15  Pt has a CPE for 6.11.15 but is going to run out of RX esomeprazole (NEXIUM) 40 MG and benazepril (LOTENSIN) 40 MG and Liraglutide (VICTOZA) 18 MG/3ML .  Pt would like these refilled if possible.

## 2013-08-31 ENCOUNTER — Ambulatory Visit (INDEPENDENT_AMBULATORY_CARE_PROVIDER_SITE_OTHER): Payer: BC Managed Care – PPO | Admitting: Podiatry

## 2013-08-31 ENCOUNTER — Encounter: Payer: Self-pay | Admitting: Podiatry

## 2013-08-31 VITALS — BP 166/86 | HR 74 | Resp 16

## 2013-08-31 DIAGNOSIS — M722 Plantar fascial fibromatosis: Secondary | ICD-10-CM

## 2013-08-31 MED ORDER — TRIAMCINOLONE ACETONIDE 10 MG/ML IJ SUSP
10.0000 mg | Freq: Once | INTRAMUSCULAR | Status: AC
  Administered 2013-08-31: 10 mg

## 2013-08-31 NOTE — Progress Notes (Signed)
Subjective:     Patient ID: Tammy [name], female   [name]: January 01, 1961, 53 y.o.   MRN: [id number]  HPI patient is found to have painful left heel at the insertion of the tendon the calcaneus stating the right is better but the left one is driving me crazy   Review of Systems     Objective:   Physical Exam Neurovascular status intact with obesity a complicating factor with inflammation in the left heel. Orthotics are no longer supporting the    Assessment:     Her fasciitis of the left heel at the insertion with obesity as complicating factor    Plan:     Reinjected the plantar fascia 3 mg Kenalog 5 mg Xylocaine Marcaine mixture and scanned for new custom orthotics do to loss of integrity of the last pair

## 2013-09-19 ENCOUNTER — Encounter: Payer: Self-pay | Admitting: Podiatry

## 2013-09-19 ENCOUNTER — Ambulatory Visit (INDEPENDENT_AMBULATORY_CARE_PROVIDER_SITE_OTHER): Payer: BC Managed Care – PPO | Admitting: Podiatry

## 2013-09-19 VITALS — BP 152/66 | HR 82 | Resp 12

## 2013-09-19 DIAGNOSIS — M7750 Other enthesopathy of unspecified foot: Secondary | ICD-10-CM

## 2013-09-19 DIAGNOSIS — M722 Plantar fascial fibromatosis: Secondary | ICD-10-CM

## 2013-09-19 MED ORDER — TRIAMCINOLONE ACETONIDE 10 MG/ML IJ SUSP
10.0000 mg | Freq: Once | INTRAMUSCULAR | Status: AC
  Administered 2013-09-19: 10 mg

## 2013-09-19 NOTE — Progress Notes (Addendum)
   Subjective:    Patient ID: Tammy [name], female    [name]: Mar 17, 1961, 53 y.o.   MRN: [id number]  HPI PICK UP ORTHOTICS AND GIVEN INSTRUCTION.    Review of Systems     Objective:   Physical Exam  Patient is found to have pain at the posterior tib insertion right      Assessment & Plan:  Today I did a very careful injection 3 mg Kenalog 5 of Xylocaine Marcaine mixture and applied air fracture walker to immobilize and instructed on reduced activities

## 2013-09-19 NOTE — Patient Instructions (Signed)

## 2013-09-19 NOTE — Addendum Note (Signed)
Addended by: Wallene Huh on: 09/19/2013 11:38 AM   Modules accepted: Orders

## 2013-10-12 ENCOUNTER — Telehealth: Payer: Self-pay

## 2013-10-12 NOTE — Telephone Encounter (Signed)
Medication and allergies:  Reviewed and updated  90 day supply/mail order: n/a Local pharmacy:  [id number] - [place], [place] - [address] AT [place]   Immunizations due:  PNA   A/P: No changes to personal, family history or past surgical hx PAP- 3 years ago per patient; hx. hysterectomy [facility]- 04/11/11- polyp---repeat in 5 years (04/2016) [facility]- 02/16/13--BI-RAD-negative Tdap- 07/20/07 PNA- DUE Foot Exam- DUE Hemoglobin A1c -DUE  To Discuss with Provider: Atorvastatin---has not taken medication for at least 3 months per patient--makes patient sick Support Hose Concerned about feet Needs physical for bariatric surgery

## 2013-10-13 ENCOUNTER — Ambulatory Visit (INDEPENDENT_AMBULATORY_CARE_PROVIDER_SITE_OTHER): Payer: BC Managed Care – PPO | Admitting: Family Medicine

## 2013-10-13 ENCOUNTER — Encounter: Payer: Self-pay | Admitting: Family Medicine

## 2013-10-13 ENCOUNTER — Other Ambulatory Visit (INDEPENDENT_AMBULATORY_CARE_PROVIDER_SITE_OTHER): Payer: BC Managed Care – PPO

## 2013-10-13 VITALS — BP 142/90 | HR 77 | Temp 98.0°F | Ht 66.0 in | Wt 339.0 lb

## 2013-10-13 DIAGNOSIS — E785 Hyperlipidemia, unspecified: Secondary | ICD-10-CM

## 2013-10-13 DIAGNOSIS — R609 Edema, unspecified: Secondary | ICD-10-CM

## 2013-10-13 DIAGNOSIS — IMO0002 Reserved for concepts with insufficient information to code with codable children: Secondary | ICD-10-CM

## 2013-10-13 DIAGNOSIS — I10 Essential (primary) hypertension: Secondary | ICD-10-CM

## 2013-10-13 DIAGNOSIS — Z0000 Encounter for general adult medical examination without abnormal findings: Secondary | ICD-10-CM

## 2013-10-13 DIAGNOSIS — K219 Gastro-esophageal reflux disease without esophagitis: Secondary | ICD-10-CM

## 2013-10-13 DIAGNOSIS — E1065 Type 1 diabetes mellitus with hyperglycemia: Secondary | ICD-10-CM

## 2013-10-13 DIAGNOSIS — Z23 Encounter for immunization: Secondary | ICD-10-CM

## 2013-10-13 DIAGNOSIS — E108 Type 1 diabetes mellitus with unspecified complications: Secondary | ICD-10-CM

## 2013-10-13 LAB — HEPATIC FUNCTION PANEL
ALBUMIN: 3.8 g/dL (ref 3.5–5.2)
ALK PHOS: 67 U/L (ref 39–117)
ALT: 24 U/L (ref 0–35)
AST: 23 U/L (ref 0–37)
Bilirubin, Direct: 0.1 mg/dL (ref 0.0–0.3)
TOTAL PROTEIN: 7.7 g/dL (ref 6.0–8.3)
Total Bilirubin: 0.5 mg/dL (ref 0.2–1.2)

## 2013-10-13 LAB — CBC WITH DIFFERENTIAL/PLATELET
Basophils Absolute: 0 10*3/uL (ref 0.0–0.1)
Basophils Relative: 0.8 % (ref 0.0–3.0)
EOS PCT: 2.6 % (ref 0.0–5.0)
Eosinophils Absolute: 0.1 10*3/uL (ref 0.0–0.7)
HCT: 39.1 % (ref 36.0–46.0)
HEMOGLOBIN: 12.9 g/dL (ref 12.0–15.0)
LYMPHS ABS: 1.1 10*3/uL (ref 0.7–4.0)
Lymphocytes Relative: 40.6 % (ref 12.0–46.0)
MCHC: 33 g/dL (ref 30.0–36.0)
MCV: 87 fl (ref 78.0–100.0)
Monocytes Absolute: 0.2 10*3/uL (ref 0.1–1.0)
Monocytes Relative: 6.9 % (ref 3.0–12.0)
NEUTROS ABS: 1.3 10*3/uL — AB (ref 1.4–7.7)
Neutrophils Relative %: 49.1 % (ref 43.0–77.0)
Platelets: 216 10*3/uL (ref 150.0–400.0)
RBC: 4.49 Mil/uL (ref 3.87–5.11)
RDW: 14.6 % (ref 11.5–15.5)

## 2013-10-13 LAB — BASIC METABOLIC PANEL
BUN: 13 mg/dL (ref 6–23)
CO2: 27 meq/L (ref 19–32)
Calcium: 8.7 mg/dL (ref 8.4–10.5)
Chloride: 103 mEq/L (ref 96–112)
Creatinine, Ser: 1 mg/dL (ref 0.4–1.2)
GFR: 78.28 mL/min (ref >60.00)
GLUCOSE: 79 mg/dL (ref 70–99)
Potassium: 3.9 mEq/L (ref 3.5–5.1)
Sodium: 139 mEq/L (ref 135–145)

## 2013-10-13 LAB — LIPID PANEL
Cholesterol: 193 mg/dL (ref 0–200)
HDL: 64.7 mg/dL (ref >39.00)
LDL Cholesterol: 122 mg/dL — ABNORMAL HIGH (ref 0–99)
NONHDL: 128.3
Total CHOL/HDL Ratio: 3
Triglycerides: 33 mg/dL (ref 0.0–149.0)
VLDL: 6.6 mg/dL (ref 0.0–40.0)

## 2013-10-13 LAB — TSH: TSH: 1.87 u[IU]/mL (ref 0.35–4.50)

## 2013-10-13 LAB — HEMOGLOBIN A1C: Hgb A1c MFr Bld: 6 % (ref 4.6–6.5)

## 2013-10-13 MED ORDER — LIRAGLUTIDE 18 MG/3ML ~~LOC~~ SOPN
1.8000 mg | PEN_INJECTOR | Freq: Every day | SUBCUTANEOUS | Status: DC
Start: 2013-10-13 — End: 2014-05-02

## 2013-10-13 MED ORDER — INSULIN PEN NEEDLE 32G X 6 MM MISC: 1.8000 mg | Freq: Every day | Status: DC

## 2013-10-13 MED ORDER — ESOMEPRAZOLE MAGNESIUM 40 MG PO CPDR: 40.0000 mg | DELAYED_RELEASE_CAPSULE | Freq: Every day | ORAL | Status: DC

## 2013-10-13 MED ORDER — FUROSEMIDE 40 MG PO TABS: 40.0000 mg | ORAL_TABLET | Freq: Every day | ORAL | Status: DC

## 2013-10-13 MED ORDER — VALSARTAN 160 MG PO TABS: 160.0000 mg | ORAL_TABLET | Freq: Every day | ORAL | Status: DC

## 2013-10-13 MED ORDER — LIRAGLUTIDE 18 MG/3ML ~~LOC~~ SOPN: 1.8000 mg | PEN_INJECTOR | Freq: Every day | SUBCUTANEOUS | Status: DC

## 2013-10-13 MED ORDER — BENAZEPRIL HCL 40 MG PO TABS: ORAL_TABLET | ORAL | Status: DC

## 2013-10-13 MED ORDER — GLUCOSE BLOOD VI STRP: ORAL_STRIP | Status: DC

## 2013-10-13 NOTE — Patient Instructions (Signed)

## 2013-10-13 NOTE — Progress Notes (Signed)
Pre visit review using our clinic review tool, if applicable. No additional management support is needed unless otherwise documented below in the visit note. 

## 2013-10-13 NOTE — Progress Notes (Signed)
Subjective:     Tammy [name] is a 53 y.o. female and is here for a comprehensive physical exam. The patient reports problems - pt planning to have bariatric surgery.  she is not exercising regularly secondary to foot pain and injections she received.  HPI HYPERTENSION  Blood pressure range-high  Chest pain- no      Dyspnea- no Lightheadedness- no   Edema- yes Other side effects - no   Medication compliance: no Low salt diet- yes  DIABETES  Blood Sugar ranges-89-120 Polyuria- no New Visual problems- no Hypoglycemic symptoms- no Other side effects-no Medication compliance - good Last eye exam- 01/2013 Foot exam- today  HYPERLIPIDEMIA  Medication compliance- good RUQ pain- no  Muscle aches- no Other side effects-no    History   Social History  . Marital Status: Married    Spouse Name: N/A    Number of Children: 0  . 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 Smoking status: Never Smoker   . Smokeless tobacco: Never Used  . Alcohol Use: No  . Drug Use: No  . Sexual Activity: Yes    Partners: Male   Other Topics Concern  . Not on file   Social History Narrative  . No narrative on file   Health Maintenance  Topic Date Due  . Foot Exam  02/03/1971  . Pap Smear  02/03/1979  . Pneumococcal Polysaccharide Vaccine (##2) 03/18/2012  . Hemoglobin A1c  04/23/2013  . Urine Microalbumin  10/22/2013  . Influenza Vaccine  12/03/2013  . Ophthalmology Exam  01/24/2014  . Mammogram  02/17/2015  . Colonoscopy  04/10/2016  . Tetanus/tdap  07/19/2017    The following portions of the patient's history were reviewed and updated as appropriate:  She  has a past medical history of GERD (gastroesophageal reflux disease); Hypertension; Hyperlipidemia; Diabetes mellitus; Pneumonia; Sinusitis; Acute on chronic appendicitis s/p lap appy 06/10/2012 (06/10/2012); and Morbid obesity - BMI > 50  (12/13/2012). She  does not have any pertinent problems on file. She  has past surgical history that includes Abdominal hysterectomy; Knee surgery (Right); Upper gastrointestinal endoscopy (04/14/2006); Colonoscopy (04/11/11); and laparoscopic appendectomy (06/10/2012). Her family history includes Cancer (age of onset: 44) in her mother; Diabetes in her father and paternal grandmother; Hyperlipidemia in her brother and mother; Hypertension in her mother. There is no history of Colon cancer, Heart attack, or Sudden death. She  reports that she has never smoked. She has never used smokeless tobacco. She reports that she does not drink alcohol or use illicit drugs. She has a current medication list which includes the following prescription(s): futuro sheer support hose, esomeprazole, fluticasone, furosemide, glucose blood, insulin pen needle, liraglutide, ondansetron, tramadol, and valsartan. Current Outpatient Prescriptions on File Prior to Visit  Medication Sig Dispense Refill  . Elastic Bandages & Supports (FUTURO SHEER SUPPORT HOSE) MISC 1 packet by Other route daily. 1 pair of support hose to wear daily      . fluticasone (FLONASE) 50 MCG/ACT nasal spray Place 2 sprays into the nose daily.  16 g  5  . Insulin Pen Needle (NOVOFINE) 32G X 6 MM MISC Inject 1.8 mg into the skin daily.  100 each  3  . ondansetron (ZOFRAN) 8 MG tablet Take 1 tablet (8 mg total) by mouth every 8 (eight) hours as needed for nausea or vomiting.  20 tablet  0  . traMADol (ULTRAM) 50  MG tablet Take 1-2 tablets (50-100 mg total) by mouth every 6 (six) hours as needed for pain.  30 tablet  1   No current facility-administered medications on file prior to visit.   She is allergic to red dye and tuna..  Review of Systems Review of Systems  Constitutional: Negative for activity change, appetite change and fatigue.  HENT: Negative for hearing loss, congestion, tinnitus and ear discharge.  dentist q36m Eyes: Negative for visual  disturbance (see optho q1y -- vision corrected to 20/20 with glasses).  Respiratory: Negative for cough, chest tightness and shortness of breath.   Cardiovascular: Negative for chest pain, palpitations and leg swelling.  Gastrointestinal: Negative for abdominal pain, diarrhea, constipation and abdominal distention.  Genitourinary: Negative for urgency, frequency, decreased urine volume and difficulty urinating.  Musculoskeletal: Negative for back pain, arthralgias and gait problem.  Skin: Negative for color change, pallor and rash.  Neurological: Negative for dizziness, light-headedness, numbness and headaches.  Hematological: Negative for adenopathy. Does not bruise/bleed easily.  Psychiatric/Behavioral: Negative for suicidal ideas, confusion, sleep disturbance, self-injury, dysphoric mood, decreased concentration and agitation.       Objective:    BP 142/90  Pulse 77  Temp(Src) 98 F (36.7 C) (Oral)  Ht 5\' 6"  (1.676 m)  Wt 339 lb (153.769 kg)  BMI 54.74 kg/m2  SpO2 97% General appearance: alert, cooperative, appears stated age, no distress and morbidly obese Head: Normocephalic, without obvious abnormality, atraumatic Eyes: conjunctivae/corneas clear. PERRL, EOM's intact. Fundi benign. Ears: normal TM's and external ear canals both ears Nose: Nares normal. Septum midline. Mucosa normal. No drainage or sinus tenderness. Throat: lips, mucosa, and tongue normal; teeth and gums normal Neck: no adenopathy, no carotid bruit, no JVD, supple, symmetrical, trachea midline and thyroid not enlarged, symmetric, no tenderness/mass/nodules Back: symmetric, no curvature. ROM normal. No CVA tenderness. Lungs: clear to auscultation bilaterally Breasts: normal appearance, no masses or tenderness Heart: S1, S2 normal Abdomen: soft, non-tender; bowel sounds normal; no masses,  no organomegaly Pelvic: deferred Extremities: edema 2+  Skin: Skin color, texture, turgor normal. No rashes or  lesions Lymph nodes: Cervical, supraclavicular, and axillary nodes normal. Neurologic: Alert and oriented X 3, normal strength and tone. Normal symmetric reflexes. Normal coordination and gait Mental status: Alert, oriented, thought content appropriate Psych-no depression, no anxiety      Assessment:    Healthy female exam.      Plan:    ghm utd Check labs See After Visit Summary for Counseling Recommendations

## 2013-10-14 ENCOUNTER — Telehealth: Payer: Self-pay | Admitting: Family Medicine

## 2013-10-14 NOTE — Telephone Encounter (Signed)
Relevant patient education mailed to patient.  

## 2013-10-17 ENCOUNTER — Encounter: Payer: Self-pay | Admitting: Family Medicine

## 2013-10-17 ENCOUNTER — Encounter: Payer: Self-pay | Admitting: Podiatry

## 2013-10-17 ENCOUNTER — Ambulatory Visit (INDEPENDENT_AMBULATORY_CARE_PROVIDER_SITE_OTHER): Payer: BC Managed Care – PPO | Admitting: Podiatry

## 2013-10-17 VITALS — BP 151/84 | HR 69 | Resp 16

## 2013-10-17 DIAGNOSIS — M7750 Other enthesopathy of unspecified foot: Secondary | ICD-10-CM

## 2013-10-17 MED ORDER — TRIAMCINOLONE ACETONIDE 10 MG/ML IJ SUSP
10.0000 mg | Freq: Once | INTRAMUSCULAR | Status: AC
  Administered 2013-10-17: 10 mg

## 2013-10-17 NOTE — Progress Notes (Signed)
Subjective:     Patient ID: Tammy [name], female   [name]: [birth year], 52 y.o.   MRN: [id number]  HPI patient presents stating I am having pain in my right foot around the tendon the need to something about and orthotics feel like it may be a little too high for me on the right side   Review of Systems     Objective:   Physical Exam Neurovascular status is intact with mild  to moderate discomfort noted around the posterior tibial tendon right slightly proximal to the insertion    Assessment:     Tendinitis right with extreme obesity is complicating factor    Plan:     Careful injection around the sheath and advised him reduced activity and I lowered the orthotic on the right side. Reappoint Tammy [name] recheck

## 2013-10-18 ENCOUNTER — Telehealth: Payer: Self-pay

## 2013-10-18 NOTE — Telephone Encounter (Signed)
Spoke with patient and she said she would not take the Benazepril but if she decided to switch she would call us back and let us know.    KP

## 2013-10-18 NOTE — Telephone Encounter (Signed)
Spoke with patient and she stated she got an Rx for Benazepril in the mail and her Rx ws changed to Diovan, She wanted to know if you want her to take the Benazepril since she has already ben taking the Diovan or what she should do with the Benazepril. Please advise    KP

## 2013-10-18 NOTE — Telephone Encounter (Signed)
She could finish benazapril first if she wants.   I did not know the rx was sent to pharmacy

## 2013-10-31 ENCOUNTER — Ambulatory Visit: Payer: BC Managed Care – PPO | Admitting: Physician Assistant

## 2013-11-01 ENCOUNTER — Ambulatory Visit (INDEPENDENT_AMBULATORY_CARE_PROVIDER_SITE_OTHER): Payer: BC Managed Care – PPO | Admitting: Family Medicine

## 2013-11-01 ENCOUNTER — Encounter: Payer: Self-pay | Admitting: Family Medicine

## 2013-11-01 VITALS — BP 130/80 | HR 76 | Temp 98.3°F | Wt 343.0 lb

## 2013-11-01 DIAGNOSIS — M79609 Pain in unspecified limb: Secondary | ICD-10-CM

## 2013-11-01 DIAGNOSIS — M79671 Pain in right foot: Secondary | ICD-10-CM

## 2013-11-01 NOTE — Progress Notes (Signed)
Subjective:    Tammy [name] is a 53 y.o. female who presents for evaluation of edema in both ankles and feet. The edema has been moderate. Onset of symptoms was several months ago, and patient reports symptoms have gradually worsened since that time. The edema is present all day. The patient states the problem is long-standing. The swelling has been aggravated by dependency of involved area and hot weather. The swelling has been relieved by diuretics, support stockings, elevation of involved area. Associated factors include: weight gain. Cardiac risk factors: diabetes mellitus, dyslipidemia, hypertension, obesity (BMI >= 30 kg/m2) and sedentary lifestyle. Pt saw podiatry recently and had an injection in L ankle for pain --  The pain has improved but not completely and pt is concerned something else is wrong.  The following portions of the patient's history were reviewed and updated as appropriate:  She  has a past medical history of GERD (gastroesophageal reflux disease); Hypertension; Hyperlipidemia; Diabetes mellitus; Pneumonia; Sinusitis; Acute on chronic appendicitis s/p lap appy 06/10/2012 (06/10/2012); and Morbid obesity - BMI > 50 (12/13/2012). She  does not have any pertinent problems on file. She  has past surgical history that includes Abdominal hysterectomy; Knee surgery (Right); Upper gastrointestinal endoscopy (04/14/2006); Colonoscopy (04/11/11); and laparoscopic appendectomy (06/10/2012). Her family history includes Cancer (age of onset: 71) in her mother; Diabetes in her father and paternal grandmother; Hyperlipidemia in her brother and mother; Hypertension in her mother. There is no history of Colon cancer, Heart attack, or Sudden death. She  reports that she has never smoked. She has never used smokeless tobacco. She reports that she does not drink alcohol or use illicit drugs. She has a current medication list which includes the following prescription(s): futuro sheer support hose,  esomeprazole, fluticasone, furosemide, glucose blood, insulin pen needle, liraglutide, ondansetron, tramadol, and valsartan. Current Outpatient Prescriptions on File Prior to Visit  Medication Sig Dispense Refill  . Elastic Bandages & Supports (FUTURO SHEER SUPPORT HOSE) MISC 1 packet by Other route daily. 1 pair of support hose to wear daily      . esomeprazole (NEXIUM) 40 MG capsule Take 1 capsule (40 mg total) by mouth daily before breakfast.  90 capsule  3  . fluticasone (FLONASE) 50 MCG/ACT nasal spray Place 2 sprays into the nose daily.  16 g  5  . furosemide (LASIX) 40 MG tablet Take 1 tablet (40 mg total) by mouth daily.  90 tablet  1  . glucose blood test strip Check blood sugar twice daily  100 each  11  . Insulin Pen Needle (NOVOFINE) 32G X 6 MM MISC Inject 1.8 mg into the skin daily.  100 each  3  . Liraglutide (VICTOZA) 18 MG/3ML SOPN Inject 1.8 mg into the skin daily.  9 pen  3  . ondansetron (ZOFRAN) 8 MG tablet Take 1 tablet (8 mg total) by mouth every 8 (eight) hours as needed for nausea or vomiting.  20 tablet  0  . traMADol (ULTRAM) 50 MG tablet Take 1-2 tablets (50-100 mg total) by mouth every 6 (six) hours as needed for pain.  30 tablet  1  . valsartan (DIOVAN) 160 MG tablet Take 1 tablet (160 mg total) by mouth daily.  30 tablet  2   No current facility-administered medications on file prior to visit.   She is allergic to cheese; red dye; and tuna..  Review of Systems Pertinent items are noted in HPI.   Objective:    BP 130/80  Pulse 76  Temp(Src) 98.3 F (36.8 C) (Oral)  Wt 343 lb (155.584 kg)  SpO2 99% General appearance: alert, cooperative, appears stated age, no distress and morbidly obese Neck: no adenopathy, no carotid bruit, no JVD, supple, symmetrical, trachea midline and thyroid not enlarged, symmetric, no tenderness/mass/nodules Lungs: clear to auscultation bilaterally Heart: regular rate and rhythm, S1, S2 normal, no murmur, click, rub or  gallop Extremities: edema +2 pitting edema b/l low ext ---pain L ankle with weight bearing only, + errythema and warm   Cardiographics ECG: not done  Imaging Chest x-ray: not indicated   Assessment:     Edema and ankle pain.    Plan:    Recommendations: decrease sodium in the diet, elevate feet above the level of the heart whenever possible, increase physical activity, use of compression stockings and weight loss. The patient was also instructed to call IMMEDIATELY (i.e., day or night) if any cardiopulmonary symptoms occur, especially chest pain, shortness of breath, dyspnea on exertion, paroxysmal nocturnal dyspnea, or orthopnea, and these were explained. Follow up in 2 weeks and as needed.  1. Right foot pain Pt requesting something else be done if possible Compression stockings - Ambulatory referral to Sports Medicine

## 2013-11-01 NOTE — Patient Instructions (Signed)
Edema Edema is an abnormal buildup of fluids in your bodytissues. Edema is somewhatdependent on gravity to pull the fluid to the lowest place in your body. That makes the condition more common in the legs and thighs (lower extremities). Painless swelling of the feet and ankles is common and becomes more likely as you get older. It is also common in looser tissues, like around your eyes.  When the affected area is squeezed, the fluid may move out of that spot and leave a dent for a few moments. This dent is called pitting.  CAUSES  There are many possible causes of edema. Eating too much salt and being on your feet or sitting for a long time can cause edema in your legs and ankles. Hot weather may make edema worse. Common medical causes of edema include:  Heart failure.  Liver disease.  Kidney disease.  Weak blood vessels in your legs.  Cancer.  An injury.  Pregnancy.  Some medications.  Obesity. SYMPTOMS  Edema is usually painless.Your skin may look swollen or shiny.  DIAGNOSIS  Your health care Tammy [name] may be able to diagnose edema by asking about your medical history and doing a physical exam. You may need to have tests such as X-rays, an electrocardiogram, or blood tests to check for medical conditions that may cause edema.  TREATMENT  Edema treatment depends on the cause. If you have heart, liver, or kidney disease, you need the treatment appropriate for these conditions. General treatment may include:  Elevation of the affected body part above the level of your heart.  Compression of the affected body part. Pressure from elastic bandages or support stockings squeezes the tissues and forces fluid back into the blood vessels. This keeps fluid from entering the tissues.  Restriction of fluid and salt intake.  Use of a water pill (diuretic). These medications are appropriate only for some types of edema. They pull fluid out of your body and make you urinate more often. This  gets rid of fluid and reduces swelling, but diuretics can have side effects. Only use diuretics as directed by your health care Tammy [name]. HOME CARE INSTRUCTIONS   Keep the affected body part above the level of your heart when you are lying down.   Do not sit still or stand for prolonged periods.   Do not put anything directly under your knees when lying down.  Do not wear constricting clothing or garters on your upper legs.   Exercise your legs to work the fluid back into your blood vessels. This may help the swelling go down.   Wear elastic bandages or support stockings to reduce ankle swelling as directed by your health care Tammy [name].   Eat a low-salt diet to reduce fluid if your health care Tammy [name] recommends it.   Only take medicines as directed by your health care Tammy [name]. SEEK MEDICAL CARE IF:   Your edema is not responding to treatment.  You have heart, liver, or kidney disease and notice symptoms of edema.  You have edema in your legs that does not improve after elevating them.   You have sudden and unexplained weight gain. SEEK IMMEDIATE MEDICAL CARE IF:   You develop shortness of breath or chest pain.   You cannot breathe when you lie down.  You develop pain, redness, or warmth in the swollen areas.   You have heart, liver, or kidney disease and suddenly get edema.  You have a fever and your symptoms suddenly get worse. MAKE SURE YOU:     Understand these instructions.  Will watch your condition.  Will get help right away if you are not doing well or get worse. Document Released: 04/21/2005 Document Revised: 04/26/2013 Document Reviewed: 02/11/2013 [employer] Patient Information 2015 [employer]. This information is not intended to replace advice given to you by your health care Tammy [name]. Make sure you discuss any questions you have with your health care Tammy [name].  

## 2013-11-03 ENCOUNTER — Ambulatory Visit: Payer: BC Managed Care – PPO | Admitting: Family Medicine

## 2013-11-09 ENCOUNTER — Ambulatory Visit (INDEPENDENT_AMBULATORY_CARE_PROVIDER_SITE_OTHER): Payer: BC Managed Care – PPO | Admitting: Family Medicine

## 2013-11-09 ENCOUNTER — Encounter: Payer: Self-pay | Admitting: Family Medicine

## 2013-11-09 ENCOUNTER — Ambulatory Visit: Payer: BC Managed Care – PPO | Admitting: Family Medicine

## 2013-11-09 VITALS — BP 161/91 | HR 72 | Ht 65.0 in | Wt 325.0 lb

## 2013-11-09 DIAGNOSIS — M25571 Pain in right ankle and joints of right foot: Secondary | ICD-10-CM

## 2013-11-09 DIAGNOSIS — M25579 Pain in unspecified ankle and joints of unspecified foot: Secondary | ICD-10-CM

## 2013-11-09 MED ORDER — NITROGLYCERIN 0.2 MG/HR TD PT24: MEDICATED_PATCH | TRANSDERMAL | Status: DC

## 2013-11-09 NOTE — Patient Instructions (Signed)
You have posterior tibialis tendinitis. Orthotics and exercises are the most important parts of treatment. Try the arch binders too. Nitro patches - apply 1/4th patch to the affected foot/ankle, change this every day in a slightly different spot. Icing as needed. A cortisone shot and ankle brace are options but typically aren't as helpful as the above. Follow up with me in 6 weeks.

## 2013-11-10 ENCOUNTER — Encounter: Payer: Self-pay | Admitting: Family Medicine

## 2013-11-10 DIAGNOSIS — M25571 Pain in right ankle and joints of right foot: Secondary | ICD-10-CM | POA: Insufficient documentation

## 2013-11-10 NOTE — Assessment & Plan Note (Signed)
2/2 posterior tibialis tendinitis.  Given home exercise program.  Continue working to improve her orthotics and wear as often as possible.  Avoid barefoot walking and flat shoes.  Try arch binders, nitro patches as well.  Icing as needed.  Already s/p injection without much benefit.  Consider ankle brace.  F/u in 6 weeks.

## 2013-11-10 NOTE — Progress Notes (Signed)
Patient ID: Tammy [name], female   [name]: [birth year], 53 y.o.   MRN: [id number]  PCP: Garnet Koyanagi, DO  Subjective:   HPI: Patient is a 53 y.o. female here for right foot/ankle pain.  Patient reports for several months she has developed pain in right leg medially. Radiates up to knee at times. Has seen podiatrist - had injection for plantar fasciitis and also posterior tibialis tendon (latter one last month).  First injeciton helped with plantar fasciitis - second one without much benefit. Has orthotics but unable to tolerate them for more than 6 hours - has to switch shoes. A lot of swelling of ankle/lower leg - uses compression stockings and takes lasix.  Past Medical History  Diagnosis Date  . GERD (gastroesophageal reflux disease)   . Hypertension   . Hyperlipidemia   . Diabetes mellitus     TYPE 2  . Pneumonia   . Sinusitis   . Acute on chronic appendicitis s/p lap appy 06/10/2012 06/10/2012  . Morbid obesity - BMI > 50 12/13/2012    Current Outpatient Prescriptions on File Prior to Visit  Medication Sig Dispense Refill  . Elastic Bandages & Supports (FUTURO SHEER SUPPORT HOSE) MISC 1 packet by Other route daily. 1 pair of support hose to wear daily      . esomeprazole (NEXIUM) 40 MG capsule Take 1 capsule (40 mg total) by mouth daily before breakfast.  90 capsule  3  . fluticasone (FLONASE) 50 MCG/ACT nasal spray Place 2 sprays into the nose daily.  16 g  5  . furosemide (LASIX) 40 MG tablet Take 1 tablet (40 mg total) by mouth daily.  90 tablet  1  . glucose blood test strip Check blood sugar twice daily  100 each  11  . Insulin Pen Needle (NOVOFINE) 32G X 6 MM MISC Inject 1.8 mg into the skin daily.  100 each  3  . Liraglutide (VICTOZA) 18 MG/3ML SOPN Inject 1.8 mg into the skin daily.  9 pen  3  . ondansetron (ZOFRAN) 8 MG tablet Take 1 tablet (8 mg total) by mouth every 8 (eight) hours as needed for nausea or vomiting.  20 tablet  0  . traMADol (ULTRAM) 50 MG tablet Take  1-2 tablets (50-100 mg total) by mouth every 6 (six) hours as needed for pain.  30 tablet  1  . valsartan (DIOVAN) 160 MG tablet Take 1 tablet (160 mg total) by mouth daily.  30 tablet  2   No current facility-administered medications on file prior to visit.    Past Surgical History  Procedure Laterality Date  . Abdominal hysterectomy    . Knee surgery Right      KNEE SURGERY   . Upper gastrointestinal endoscopy  04/14/2006    Normal  . Colonoscopy  04/11/11    5 mm polyp removed but not recovered  . Laparoscopic appendectomy  06/10/2012    Procedure: APPENDECTOMY LAPAROSCOPIC;  Surgeon: Adin Hector, MD;  Location: [facility];  Service: General;  Laterality: N/A;    Allergies  Allergen Reactions  . Cheese   . Red Dye   . [name] Flash [Fish Allergy] Nausea And Vomiting    History   Social History  . Marital Status: Married    Spouse Name: N/A    Number of Children: 0  . Years of Education: N/A   Occupational History  . FAB technician [employer]   Social History Main Topics  . Smoking status: Never Smoker   .  Smokeless tobacco: Never Used  . Alcohol Use: No  . Drug Use: No  . Sexual Activity: Yes    Partners: Male   Other Topics Concern  . Not on file   Social History Narrative  . No narrative on file    Family History  Problem Relation Age of Onset  . Hyperlipidemia Mother   . Hypertension Mother   . Cancer Mother 36    hodgkins lymphoma  . Diabetes Father   . Hyperlipidemia Brother   . Colon cancer Neg Hx   . Heart attack Neg Hx   . Sudden death Neg Hx   . Diabetes Paternal Grandmother     BP 161/91  Pulse 72  Ht 5\' 5"  (1.651 m)  Wt 325 lb (147.419 kg)  BMI 54.08 kg/m2  Review of Systems: See HPI above.    Objective:  Physical Exam:  Gen: NAD  Right foot/ankle: Significant swelling lower leg.  Pes planus. FROM ankle with pain on internal rotation, calf raise. TTP medial ankle along course of posterior tibialis tendon, less distal  tibia. Negative ant drawer and talar tilt.   Negative syndesmotic compression. Thompsons test negative. NV intact distally.    Assessment & Plan:  1. Right ankle pain - 2/2 posterior tibialis tendinitis.  Given home exercise program.  Continue working to improve her orthotics and wear as often as possible.  Avoid barefoot walking and flat shoes.  Try arch binders, nitro patches as well.  Icing as needed.  Already s/p injection without much benefit.  Consider ankle brace.  F/u in 6 weeks.

## 2013-12-16 ENCOUNTER — Ambulatory Visit: Payer: BC Managed Care – PPO | Admitting: Medical

## 2013-12-16 ENCOUNTER — Ambulatory Visit: Payer: BC Managed Care – PPO | Admitting: Family Medicine

## 2013-12-16 DIAGNOSIS — Z0289 Encounter for other administrative examinations: Secondary | ICD-10-CM

## 2013-12-21 ENCOUNTER — Ambulatory Visit (INDEPENDENT_AMBULATORY_CARE_PROVIDER_SITE_OTHER): Payer: BC Managed Care – PPO | Admitting: Family Medicine

## 2013-12-21 ENCOUNTER — Encounter: Payer: Self-pay | Admitting: Family Medicine

## 2013-12-21 ENCOUNTER — Ambulatory Visit (INDEPENDENT_AMBULATORY_CARE_PROVIDER_SITE_OTHER): Payer: BC Managed Care – PPO | Admitting: Medical

## 2013-12-21 ENCOUNTER — Encounter: Payer: Self-pay | Admitting: Medical

## 2013-12-21 VITALS — BP 161/96 | HR 71 | Ht 66.0 in | Wt 330.0 lb

## 2013-12-21 VITALS — BP 133/80 | HR 90 | Temp 98.3°F | Wt 340.0 lb

## 2013-12-21 DIAGNOSIS — J309 Allergic rhinitis, unspecified: Secondary | ICD-10-CM

## 2013-12-21 DIAGNOSIS — J302 Other seasonal allergic rhinitis: Secondary | ICD-10-CM

## 2013-12-21 DIAGNOSIS — M25571 Pain in right ankle and joints of right foot: Secondary | ICD-10-CM

## 2013-12-21 DIAGNOSIS — M25579 Pain in unspecified ankle and joints of unspecified foot: Secondary | ICD-10-CM

## 2013-12-21 MED ORDER — FLUTICASONE PROPIONATE 50 MCG/ACT NA SUSP: 2.0000 | Freq: Every day | NASAL | Status: DC

## 2013-12-21 MED ORDER — AMOXICILLIN-POT CLAVULANATE 875-125 MG PO TABS: 1.0000 | ORAL_TABLET | Freq: Two times a day (BID) | ORAL | Status: DC

## 2013-12-21 MED ORDER — BENZONATATE 100 MG PO CAPS: 100.0000 mg | ORAL_CAPSULE | Freq: Three times a day (TID) | ORAL | Status: DC | PRN

## 2013-12-21 NOTE — Patient Instructions (Signed)
Continue the nitro patches for 6 more weeks. Call me if you want to try physical therapy. Continue using the orthotics and doing the exercises as you have been. Arch binders as much as possible. Icing as needed. Follow up with me in 6 weeks.

## 2013-12-21 NOTE — Patient Instructions (Addendum)
Your appear to have recent allergic rhinitis with possible early sinusitis. Continue with claritin and I am sending fluticasone spray to your pharmacy. If your sinus pressure persists/worsens by Friday or bronchitis type symptoms occur then start augmentin. That antibiotic  was sent to your pharmacy. Follow up in 7 days or as needed.

## 2013-12-21 NOTE — Assessment & Plan Note (Signed)
Advised use claritin and start fluticasone. If sinus symptoms worsen despite allergy meds start augmentin. Rx of benzonatate for cough.

## 2013-12-21 NOTE — Progress Notes (Signed)
Subjective:    Patient ID: Tammy [name], female    [name]: 1960-06-03, 53 y.o.   MRN: [id number]  HPI  Pt in with some symptom sore throat since Saturday. Pt has been on claritin and she does take fluticasone(ran out). Pt can feel pnd. Also dry cough at night. Pt does not think wheezing. Pt feels cold for five days. No fever. She feels congested in her chest some. No history of asthma. No chest pain. Allergies in spring and summer usually. Some sinus pressure.  Past Medical History  Diagnosis Date  . GERD (gastroesophageal reflux disease)   . Hypertension   . Hyperlipidemia   . Diabetes mellitus     TYPE 2  . Pneumonia   . Sinusitis   . Acute on chronic appendicitis s/p lap appy 06/10/2012 06/10/2012  . Morbid obesity - BMI > 50 12/13/2012    History   Social History  . Marital Status: Married    Spouse Name: N/A    Number of Children: 0  . Years of Education: N/A   Occupational History  . 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 Smoking status: Never Smoker   . Smokeless tobacco: Never Used  . Alcohol Use: No  . Drug Use: No  . Sexual Activity: Yes    Partners: Male   Other Topics Concern  . Not on file   Social History Narrative  . No narrative on file    Past Surgical History  Procedure Laterality Date  . Abdominal hysterectomy    . Knee surgery Right      KNEE SURGERY   . Upper gastrointestinal endoscopy  04/14/2006    Normal  . Colonoscopy  04/11/11    5 mm polyp removed but not recovered  . Laparoscopic appendectomy  06/10/2012    Procedure: APPENDECTOMY LAPAROSCOPIC;  Surgeon: Adin Hector, MD;  Location: [facility];  Service: General;  Laterality: N/A;    Family History  Problem Relation Age of Onset  . Hyperlipidemia Mother   . Hypertension Mother   . Cancer Mother 70    hodgkins lymphoma  . Diabetes Father   . Hyperlipidemia Brother   . Colon cancer Neg Hx   . Heart attack Neg Hx   . Sudden death Neg Hx   .  Diabetes Paternal Grandmother     Allergies  Allergen Reactions  . Cheese   . Red Dye   . [name] Flash [Fish Allergy] Nausea And Vomiting    Current Outpatient Prescriptions on File Prior to Visit  Medication Sig Dispense Refill  . Elastic Bandages & Supports (FUTURO SHEER SUPPORT HOSE) MISC 1 packet by Other route daily. 1 pair of support hose to wear daily      . esomeprazole (NEXIUM) 40 MG capsule Take 1 capsule (40 mg total) by mouth daily before breakfast.  90 capsule  3  . furosemide (LASIX) 40 MG tablet Take 1 tablet (40 mg total) by mouth daily.  90 tablet  1  . glucose blood test strip Check blood sugar twice daily  100 each  11  . Insulin Pen Needle (NOVOFINE) 32G X 6 MM MISC Inject 1.8 mg into the skin daily.  100 each  3  . Liraglutide (VICTOZA) 18 MG/3ML SOPN Inject 1.8 mg into the skin daily.  9 pen  3  . nitroGLYCERIN (NITRODUR - DOSED IN MG/24 HR) 0.2 mg/hr patch Apply 1/4th patch over affected ankle, change daily  30 patch  1  . ondansetron (ZOFRAN) 8 MG tablet Take 1 tablet (8 mg total) by mouth every 8 (eight) hours as needed for nausea or vomiting.  20 tablet  0  . traMADol (ULTRAM) 50 MG tablet Take 1-2 tablets (50-100 mg total) by mouth every 6 (six) hours as needed for pain.  30 tablet  1  . valsartan (DIOVAN) 160 MG tablet Take 1 tablet (160 mg total) by mouth daily.  30 tablet  2   No current facility-administered medications on file prior to visit.    BP 133/80  Pulse 90  Temp(Src) 98.3 F (36.8 C)  Wt 340 lb (154.223 kg)  SpO2 96%    Review of Systems  Constitutional: Negative for fever, chills and fatigue.  HENT: Positive for congestion, postnasal drip, rhinorrhea and sinus pressure. Negative for ear pain, facial swelling and nosebleeds.   Respiratory: Positive for cough. Negative for chest tightness, shortness of breath and wheezing.   Cardiovascular: Negative for chest pain and palpitations.  Musculoskeletal: Negative for arthralgias, joint swelling,  myalgias, neck pain and neck stiffness.  Neurological: Negative for dizziness, syncope, speech difficulty, weakness and light-headedness.  Hematological: Negative for adenopathy. Does not bruise/bleed easily.       Objective:   Physical Exam  General  Mental Status - Alert. General Appearance - Well groomed. Not in acute distress.  Skin Rashes- No Rashes.  HEENT Head- Normal. Ear Auditory Canal - Left- Normal. Right - Normal.Tympanic Membrane- Left- Normal. Right- Normal. Eye Sclera/Conjunctiva- Left- Normal. Right- Normal. Nose & Sinuses Nasal Mucosa- Left- Boggy + Congested. Right-  Boggy + Congested. Maxillary sinus pressure Mouth & Throat Lips: Upper Lip- Normal: no dryness, cracking, pallor, cyanosis, or vesicular eruption. Lower Lip-Normal: no dryness, cracking, pallor, cyanosis or vesicular eruption. Buccal Mucosa- Bilateral- No Aphthous ulcers. Oropharynx- No Discharge or Erythema. Tonsils: Characteristics- Bilateral- No Erythema or Congestion.Some pnd. Size/Enlargement- Bilateral- No enlargement. Discharge- bilateral-None.  Neck Neck- Supple. No Masses.   Chest and Lung Exam Auscultation: Breath Sounds:-Normal,even and unlabored.  Cardiovascular Auscultation:Rythm- Regular.  Murmurs & Other Heart Sounds:Ausculatation of the heart reveal- No Murmurs.  Lymphatic Head & Neck General Head & Neck Lymphatics: Bilateral: Description- No Localized lymphadenopathy.       Assessment & Plan:

## 2013-12-21 NOTE — Progress Notes (Signed)
Pre visit review using our clinic review tool, if applicable. No additional management support is needed unless otherwise documented below in the visit note. 

## 2013-12-26 ENCOUNTER — Encounter: Payer: Self-pay | Admitting: Family Medicine

## 2013-12-26 NOTE — Progress Notes (Signed)
Patient ID: Tammy [name], female   [name]: 1960-07-13, 53 y.o.   MRN: [id number]  PCP: Garnet Koyanagi, DO  Subjective:   HPI: Patient is a 53 y.o. female here for right foot/ankle pain.  7/8: Patient reports for several months she has developed pain in right leg medially. Radiates up to knee at times. Has seen podiatrist - had injection for plantar fasciitis and also posterior tibialis tendon (latter one last month).  First injeciton helped with plantar fasciitis - second one without much benefit. Has orthotics but unable to tolerate them for more than 6 hours - has to switch shoes. A lot of swelling of ankle/lower leg - uses compression stockings and takes lasix.  8/19: Patient reports mild improvement after last visit. She is using orthotics still about 6-8 hours and has to switch shoes. Swelling has improved some. Has been icing, elevating, wearing compression stockings. Arch binders helped but can only tolerate a short period of time. Tylenol as needed. Nitro seems to be helping too - mild headaches initially but not now.  Past Medical History  Diagnosis Date  . GERD (gastroesophageal reflux disease)   . Hypertension   . Hyperlipidemia   . Diabetes mellitus     TYPE 2  . Pneumonia   . Sinusitis   . Acute on chronic appendicitis s/p lap appy 06/10/2012 06/10/2012  . Morbid obesity - BMI > 50 12/13/2012    Current Outpatient Prescriptions on File Prior to Visit  Medication Sig Dispense Refill  . Elastic Bandages & Supports (FUTURO SHEER SUPPORT HOSE) MISC 1 packet by Other route daily. 1 pair of support hose to wear daily      . esomeprazole (NEXIUM) 40 MG capsule Take 1 capsule (40 mg total) by mouth daily before breakfast.  90 capsule  3  . furosemide (LASIX) 40 MG tablet Take 1 tablet (40 mg total) by mouth daily.  90 tablet  1  . glucose blood test strip Check blood sugar twice daily  100 each  11  . Insulin Pen Needle (NOVOFINE) 32G X 6 MM MISC Inject 1.8 mg into the skin  daily.  100 each  3  . Liraglutide (VICTOZA) 18 MG/3ML SOPN Inject 1.8 mg into the skin daily.  9 pen  3  . nitroGLYCERIN (NITRODUR - DOSED IN MG/24 HR) 0.2 mg/hr patch Apply 1/4th patch over affected ankle, change daily  30 patch  1  . ondansetron (ZOFRAN) 8 MG tablet Take 1 tablet (8 mg total) by mouth every 8 (eight) hours as needed for nausea or vomiting.  20 tablet  0  . traMADol (ULTRAM) 50 MG tablet Take 1-2 tablets (50-100 mg total) by mouth every 6 (six) hours as needed for pain.  30 tablet  1  . valsartan (DIOVAN) 160 MG tablet Take 1 tablet (160 mg total) by mouth daily.  30 tablet  2   No current facility-administered medications on file prior to visit.    Past Surgical History  Procedure Laterality Date  . Abdominal hysterectomy    . Knee surgery Right      KNEE SURGERY   . Upper gastrointestinal endoscopy  04/14/2006    Normal  . Colonoscopy  04/11/11    5 mm polyp removed but not recovered  . Laparoscopic appendectomy  06/10/2012    Procedure: APPENDECTOMY LAPAROSCOPIC;  Surgeon: Adin Hector, MD;  Location: [facility];  Service: General;  Laterality: N/A;    Allergies  Allergen Reactions  . Cheese   .  Red Dye   . [name] Flash [Fish Allergy] Nausea And Vomiting    History   Social History  . Marital Status: Married    Spouse Name: N/A    Number of Children: 0  . Years of Education: N/A   Occupational History  . FAB technician [name]   Social History Main Topics  . Smoking status: Never Smoker   . Smokeless tobacco: Never Used  . Alcohol Use: No  . Drug Use: No  . Sexual Activity: Yes    Partners: Male   Other Topics Concern  . Not on file   Social History Narrative  . No narrative on file    Family History  Problem Relation Age of Onset  . Hyperlipidemia Mother   . Hypertension Mother   . Cancer Mother 28    hodgkins lymphoma  . Diabetes Father   . Hyperlipidemia Brother   . Colon cancer Neg Hx   . Heart attack Neg Hx   . Sudden death  Neg Hx   . Diabetes Paternal Grandmother     BP 161/96  Pulse 71  Ht 5\' 6"  (1.676 m)  Wt 330 lb (149.687 kg)  BMI 53.29 kg/m2  Review of Systems: See HPI above.    Objective:  Physical Exam:  Gen: NAD  Right foot/ankle: Significant swelling lower leg.  Pes planus. FROM ankle with pain on internal rotation, calf raise. TTP medial ankle along course of posterior tibialis tendon, less distal tibia. Negative ant drawer and talar tilt.   Negative syndesmotic compression. Thompsons test negative. NV intact distally.    Assessment & Plan:  1. Right ankle pain - 2/2 posterior tibialis tendinitis.  Continue with home exercises, orthotics, arch binders.  Nitro for 6 more weeks.  Icing as needed.  Already s/p injection without much benefit.  Consider ankle brace, physical therapy.  F/u in 6 weeks.

## 2013-12-26 NOTE — Assessment & Plan Note (Signed)
2/2 posterior tibialis tendinitis.  Continue with home exercises, orthotics, arch binders.  Nitro for 6 more weeks.  Icing as needed.  Already s/p injection without much benefit.  Consider ankle brace, physical therapy.  F/u in 6 weeks.

## 2014-01-18 ENCOUNTER — Other Ambulatory Visit (INDEPENDENT_AMBULATORY_CARE_PROVIDER_SITE_OTHER): Payer: Self-pay

## 2014-01-18 ENCOUNTER — Ambulatory Visit (INDEPENDENT_AMBULATORY_CARE_PROVIDER_SITE_OTHER): Payer: BC Managed Care – PPO | Admitting: Surgery

## 2014-01-18 DIAGNOSIS — E1159 Type 2 diabetes mellitus with other circulatory complications: Secondary | ICD-10-CM

## 2014-01-18 DIAGNOSIS — G4733 Obstructive sleep apnea (adult) (pediatric): Secondary | ICD-10-CM

## 2014-01-18 DIAGNOSIS — E785 Hyperlipidemia, unspecified: Secondary | ICD-10-CM

## 2014-01-24 ENCOUNTER — Other Ambulatory Visit: Payer: Self-pay

## 2014-01-24 DIAGNOSIS — Z1231 Encounter for screening mammogram for malignant neoplasm of breast: Secondary | ICD-10-CM

## 2014-01-26 ENCOUNTER — Other Ambulatory Visit: Payer: Self-pay

## 2014-01-26 MED ORDER — LORATADINE 10 MG PO TABS: ORAL_TABLET | ORAL | Status: DC

## 2014-02-01 ENCOUNTER — Ambulatory Visit: Payer: BC Managed Care – PPO | Admitting: Family Medicine

## 2014-02-02 ENCOUNTER — Ambulatory Visit (HOSPITAL_BASED_OUTPATIENT_CLINIC_OR_DEPARTMENT_OTHER)
Admission: RE | Admit: 2014-02-02 | Discharge: 2014-02-02 | Disposition: A | Discharge status: Home / Self Care | Payer: BC Managed Care – PPO | Source: Ambulatory Visit | Attending: Family Medicine | Admitting: Family Medicine

## 2014-02-02 ENCOUNTER — Encounter: Payer: Self-pay | Admitting: Family Medicine

## 2014-02-02 ENCOUNTER — Ambulatory Visit (INDEPENDENT_AMBULATORY_CARE_PROVIDER_SITE_OTHER): Payer: BC Managed Care – PPO | Admitting: Family Medicine

## 2014-02-02 VITALS — BP 160/103 | HR 67 | Ht 66.0 in | Wt 331.0 lb

## 2014-02-02 DIAGNOSIS — M25462 Effusion, left knee: Secondary | ICD-10-CM | POA: Insufficient documentation

## 2014-02-02 DIAGNOSIS — M25562 Pain in left knee: Secondary | ICD-10-CM

## 2014-02-02 MED ORDER — TRAMADOL HCL 50 MG PO TABS: 50.0000 mg | ORAL_TABLET | Freq: Three times a day (TID) | ORAL | Status: DC | PRN

## 2014-02-02 NOTE — Patient Instructions (Signed)
I'm concerned you tore meniscus in your left knee. Tramadol three times a day as needed for pain (no driving on this). Start physical therapy for a couple visits then do home exercises every day for next month to 6 weeks. Knee brace or sleeve for support. Consider MRI, cortisone injection if not improving. Icing 15 minutes at a time 3-4 times a day. Elevation above the level of your heart as needed for swelling. Follow up with me in 1 month to 6 weeks.

## 2014-02-03 ENCOUNTER — Encounter: Payer: Self-pay | Admitting: Family Medicine

## 2014-02-03 NOTE — Assessment & Plan Note (Signed)
Radiographs show her known DJD is mild and mainly patellofemoral joint.  Exam and history consistent with meniscus tear with catching as well.    Will start with conservative care - patient wants to avoid surgery.  Start physical therapy and home exercises, knee brace for support.  Icing, tramadol for pain.  Consider MRI, injection if not improving.  F/u in 1 month to 6 weeks.

## 2014-02-03 NOTE — Progress Notes (Signed)
Patient ID: Tammy [name], female    [name]: October 07, 1960, 53 y.o.   MRN: [id number]  PCP: Dr. Etter Sjogren  Knee Pain    53 yo F here for f/u left knee pain.  4/2: Patient reports 2 weeks ago she was walking up some steps when her left knee gave a little and she felt a pop within her knee. Did not hurt right away - started to get more sore as time went on. Has been icing, heating knee. Has improved some recently. + swelling and achy pain. Is on feet a lot for work. No catching or locking. Taking naproxen. About 6 years ago was told she had arthritis in left knee and actually had cortisone shots which helped - no real problems since.  4/30: Patient returns stating she is better from last visit. Varies in improvement between 20-70% based on the day. Weather changes make this feel worse. Has been taking naproxen, doing home exercises, alternating ice and heat. Pain is worse after work. No catching, locking.  5/14: Patient returns today for an intraarticular cortisone injection of left knee.  02/11/13: Patient states over past 1-2 weeks pain has started to recur deep in left knee. Some slight swelling. Icing but not taking any medicines for this. No catching, locking, giving out though knee feels heavy.  02/02/14: Patient reports about 2 weeks ago she was turning to the side with left foot planted and felt a painful pop in left knee. Pain severe, currently a 4/10. Feels heavy, hard to stretch out and bend all the way. Associated with catching, difficulty sleeping. Some swelling as well. No giving out. She had been doing well since last visit.  Past Medical History  Diagnosis Date  . GERD (gastroesophageal reflux disease)   . Hypertension   . Hyperlipidemia   . Diabetes mellitus     TYPE 2  . Pneumonia   . Sinusitis   . Acute on chronic appendicitis s/p lap appy 06/10/2012 06/10/2012  . Morbid obesity - BMI > 50 12/13/2012    Current Outpatient Prescriptions on File Prior to  Visit  Medication Sig Dispense Refill  . amoxicillin-clavulanate (AUGMENTIN) 875-125 MG per tablet Take 1 tablet by mouth 2 (two) times daily.  20 tablet  0  . benzonatate (TESSALON) 100 MG capsule Take 1 capsule (100 mg total) by mouth 3 (three) times daily as needed for cough.  21 capsule  0  . Elastic Bandages & Supports (FUTURO SHEER SUPPORT HOSE) MISC 1 packet by Other route daily. 1 pair of support hose to wear daily      . esomeprazole (NEXIUM) 40 MG capsule Take 1 capsule (40 mg total) by mouth daily before breakfast.  90 capsule  3  . fluticasone (FLONASE) 50 MCG/ACT nasal spray Place 2 sprays into both nostrils daily.  16 g  3  . furosemide (LASIX) 40 MG tablet Take 1 tablet (40 mg total) by mouth daily.  90 tablet  1  . glucose blood test strip Check blood sugar twice daily  100 each  11  . Insulin Pen Needle (NOVOFINE) 32G X 6 MM MISC Inject 1.8 mg into the skin daily.  100 each  3  . Liraglutide (VICTOZA) 18 MG/3ML SOPN Inject 1.8 mg into the skin daily.  9 pen  3  . loratadine (CLARITIN) 10 MG tablet TAKE 1 TABLET BY MOUTH EVERY DAY  30 tablet  11  . nitroGLYCERIN (NITRODUR - DOSED IN MG/24 HR) 0.2 mg/hr patch Apply 1/4th patch over  affected ankle, change daily  30 patch  1  . ondansetron (ZOFRAN) 8 MG tablet Take 1 tablet (8 mg total) by mouth every 8 (eight) hours as needed for nausea or vomiting.  20 tablet  0  . valsartan (DIOVAN) 160 MG tablet Take 1 tablet (160 mg total) by mouth daily.  30 tablet  2   No current facility-administered medications on file prior to visit.    Past Surgical History  Procedure Laterality Date  . Abdominal hysterectomy    . Knee surgery Right      KNEE SURGERY   . Upper gastrointestinal endoscopy  04/14/2006    Normal  . Colonoscopy  04/11/11    5 mm polyp removed but not recovered  . Laparoscopic appendectomy  06/10/2012    Procedure: APPENDECTOMY LAPAROSCOPIC;  Surgeon: Adin Hector, MD;  Location: [facility];  Service: General;  Laterality:  N/A;    Allergies  Allergen Reactions  . Cheese   . Red Dye   . [name] Flash [Fish Allergy] Nausea And Vomiting    History   Social History  . Marital Status: Married    Spouse Name: N/A    Number of Children: 0  . 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 Smoking status: Never Smoker   . Smokeless tobacco: Never Used  . Alcohol Use: No  . Drug Use: No  . Sexual Activity: Yes    Partners: Male   Other Topics Concern  . Not on file   Social History Narrative  . No narrative on file    Family History  Problem Relation Age of Onset  . Hyperlipidemia Mother   . Hypertension Mother   . Cancer Mother 46    hodgkins lymphoma  . Diabetes Father   . Hyperlipidemia Brother   . Colon cancer Neg Hx   . Heart attack Neg Hx   . Sudden death Neg Hx   . Diabetes Paternal Grandmother     BP 160/103  Pulse 67  Ht 5\' 6"  (1.676 m)  Wt 331 lb (150.141 kg)  BMI 53.45 kg/m2  Review of Systems See HPI above.    Objective:   Physical Exam Gen: NAD  L knee: No gross deformity, ecchymoses, effusion.  Identification of joint lines difficult due to habitus. TTP medial and lateral joint lines.  No post patellar facet tenderness.  ROM 0 - 120 degrees. Negative ant/post drawers. Negative valgus/varus testing. Negative lachmanns. Positive apleys and mcmurrays. NV intact distally.    Assessment & Plan:  1. Left knee pain - Radiographs show her known DJD is mild and mainly patellofemoral joint.  Exam and history consistent with meniscus tear with catching as well.    Will start with conservative care - patient wants to avoid surgery.  Start physical therapy and home exercises, knee brace for support.  Icing, tramadol for pain.  Consider MRI, injection if not improving.  F/u in 1 month to 6 weeks.

## 2014-02-11 ENCOUNTER — Encounter: Payer: Self-pay | Admitting: Dietician

## 2014-02-11 ENCOUNTER — Encounter: Payer: BC Managed Care – PPO | Attending: Surgery | Admitting: Dietician

## 2014-02-11 DIAGNOSIS — Z713 Dietary counseling and surveillance: Secondary | ICD-10-CM | POA: Diagnosis not present

## 2014-02-11 DIAGNOSIS — Z6841 Body Mass Index (BMI) 40.0 and over, adult: Secondary | ICD-10-CM | POA: Diagnosis not present

## 2014-02-11 NOTE — Progress Notes (Signed)
  Pre-Op Assessment Visit:  Pre-Operative Bariatric (RYGB or Sleeve Gastrectomy) Surgery  Medical Nutrition Therapy:  Appt start time: 5916   End time:  3846.  Patient was seen on 02/11/2014 for Pre-Operative Nutrition Assessment. Assessment and letter of approval faxed to [facility] B[facility]ric [facility] Program coordinator on 02/11/2014.   Preferred Learning Style:   No preference indicated   Learning Readiness:   Ready  Handouts given during visit include:  Pre-Op Goals Bariatric Surgery Protein Shakes  Teaching Method Utilized:  Visual Auditory Hands on  Barriers to learning/adherence to lifestyle change: none  Demonstrated degree of understanding via:  Teach Back   Patient to call the [facility] to enroll in Pre-Op and Post-Op Nutrition Education when surgery date is scheduled.

## 2014-02-11 NOTE — Patient Instructions (Signed)
Follow Pre-Op Goals Try Protein Shakes Call [facility] at [phone] when surgery is scheduled to enroll in Pre-Op Class 

## 2014-02-21 ENCOUNTER — Ambulatory Visit (HOSPITAL_COMMUNITY)
Admission: RE | Admit: 2014-02-21 | Discharge: 2014-02-21 | Disposition: A | Discharge status: Home / Self Care | Payer: BC Managed Care – PPO | Source: Ambulatory Visit | Attending: Surgery | Admitting: Surgery

## 2014-02-21 ENCOUNTER — Other Ambulatory Visit: Payer: Self-pay

## 2014-02-21 DIAGNOSIS — K224 Dyskinesia of esophagus: Secondary | ICD-10-CM | POA: Diagnosis not present

## 2014-02-21 DIAGNOSIS — E1351 Other specified diabetes mellitus with diabetic peripheral angiopathy without gangrene: Secondary | ICD-10-CM

## 2014-02-21 DIAGNOSIS — G4733 Obstructive sleep apnea (adult) (pediatric): Secondary | ICD-10-CM | POA: Insufficient documentation

## 2014-02-21 DIAGNOSIS — Z6841 Body Mass Index (BMI) 40.0 and over, adult: Secondary | ICD-10-CM | POA: Insufficient documentation

## 2014-02-21 DIAGNOSIS — E785 Hyperlipidemia, unspecified: Secondary | ICD-10-CM

## 2014-02-21 LAB — CBC WITH DIFFERENTIAL/PLATELET
BASOS ABS: 0 10*3/uL (ref 0.0–0.1)
BASOS PCT: 1 % (ref 0–1)
EOS ABS: 0.1 10*3/uL (ref 0.0–0.7)
EOS PCT: 3 % (ref 0–5)
HCT: 37.9 % (ref 36.0–46.0)
Hemoglobin: 12.5 g/dL (ref 12.0–15.0)
LYMPHS PCT: 45 % (ref 12–46)
Lymphs Abs: 1.3 10*3/uL (ref 0.7–4.0)
MCH: 28.7 pg (ref 26.0–34.0)
MCHC: 33 g/dL (ref 30.0–36.0)
MCV: 87.1 fL (ref 78.0–100.0)
MONO ABS: 0.2 10*3/uL (ref 0.1–1.0)
Monocytes Relative: 7 % (ref 3–12)
Neutro Abs: 1.3 10*3/uL — ABNORMAL LOW (ref 1.7–7.7)
Neutrophils Relative %: 44 % (ref 43–77)
PLATELETS: 265 10*3/uL (ref 150–400)
RBC: 4.35 MIL/uL (ref 3.87–5.11)
RDW: 14.3 % (ref 11.5–15.5)
WBC: 2.9 10*3/uL — ABNORMAL LOW (ref 4.0–10.5)

## 2014-02-21 LAB — TSH: TSH: 3.181 u[IU]/mL (ref 0.350–4.500)

## 2014-02-21 LAB — COMPREHENSIVE METABOLIC PANEL
ALT: 20 U/L (ref 0–35)
AST: 21 U/L (ref 0–37)
Albumin: 3.8 g/dL (ref 3.5–5.2)
Alkaline Phosphatase: 77 U/L (ref 39–117)
BILIRUBIN TOTAL: 0.4 mg/dL (ref 0.2–1.2)
BUN: 10 mg/dL (ref 6–23)
CO2: 29 mEq/L (ref 19–32)
Calcium: 8.7 mg/dL (ref 8.4–10.5)
Chloride: 103 mEq/L (ref 96–112)
Creat: 0.84 mg/dL (ref 0.50–1.10)
Glucose, Bld: 75 mg/dL (ref 70–99)
Potassium: 4 mEq/L (ref 3.5–5.3)
Sodium: 139 mEq/L (ref 135–145)
Total Protein: 6.7 g/dL (ref 6.0–8.3)

## 2014-02-21 LAB — VITAMIN B12: Vitamin B-12: 298 pg/mL (ref 211–911)

## 2014-02-21 LAB — FOLATE: Folate: 13.9 ng/mL

## 2014-03-01 ENCOUNTER — Ambulatory Visit
Admission: RE | Admit: 2014-03-01 | Discharge: 2014-03-01 | Disposition: A | Discharge status: Home / Self Care | Payer: BC Managed Care – PPO | Source: Ambulatory Visit

## 2014-03-01 DIAGNOSIS — Z1231 Encounter for screening mammogram for malignant neoplasm of breast: Secondary | ICD-10-CM

## 2014-03-06 ENCOUNTER — Ambulatory Visit: Payer: BC Managed Care – PPO | Admitting: Physical Therapy

## 2014-03-07 ENCOUNTER — Ambulatory Visit: Payer: BC Managed Care – PPO | Admitting: Family Medicine

## 2014-03-15 LAB — HM DIABETES EYE EXAM

## 2014-03-16 ENCOUNTER — Ambulatory Visit: Payer: BC Managed Care – PPO | Admitting: Family Medicine

## 2014-03-20 ENCOUNTER — Ambulatory Visit (HOSPITAL_BASED_OUTPATIENT_CLINIC_OR_DEPARTMENT_OTHER): Payer: BC Managed Care – PPO | Attending: Surgery | Admitting: Radiology

## 2014-03-20 VITALS — Ht 66.0 in | Wt 347.0 lb

## 2014-03-20 DIAGNOSIS — G4710 Hypersomnia, unspecified: Secondary | ICD-10-CM | POA: Diagnosis present

## 2014-03-20 DIAGNOSIS — G4730 Sleep apnea, unspecified: Secondary | ICD-10-CM | POA: Diagnosis present

## 2014-03-20 DIAGNOSIS — Z6841 Body Mass Index (BMI) 40.0 and over, adult: Secondary | ICD-10-CM | POA: Insufficient documentation

## 2014-03-20 DIAGNOSIS — E1351 Other specified diabetes mellitus with diabetic peripheral angiopathy without gangrene: Secondary | ICD-10-CM

## 2014-03-20 DIAGNOSIS — G4733 Obstructive sleep apnea (adult) (pediatric): Secondary | ICD-10-CM

## 2014-03-20 DIAGNOSIS — E785 Hyperlipidemia, unspecified: Secondary | ICD-10-CM

## 2014-03-25 DIAGNOSIS — E1351 Other specified diabetes mellitus with diabetic peripheral angiopathy without gangrene: Secondary | ICD-10-CM

## 2014-03-25 DIAGNOSIS — G4733 Obstructive sleep apnea (adult) (pediatric): Secondary | ICD-10-CM

## 2014-03-25 DIAGNOSIS — E785 Hyperlipidemia, unspecified: Secondary | ICD-10-CM

## 2014-03-25 NOTE — Sleep Study (Signed)
   NAME: Tammy [name] DATE OF BIRTH:  05-Jul-1960 MEDICAL RECORD NUMBER [id number]  [facility]  PHYSICIAN: Karmello Abercrombie D  DATE OF STUDY: 03/20/2014  SLEEP STUDY TYPE: Nocturnal Polysomnogram               REFERRING PHYSICIAN: Pedro Earls, MD  INDICATION FOR STUDY: Hypersomnia with sleep apnea  EPWORTH SLEEPINESS SCORE:   8/24 HEIGHT: 5\' 6"  (167.6 cm)  WEIGHT: (!) 347 lb (157.398 kg)    Body mass index is 56.03 kg/(m^2).  NECK SIZE: 15 in.  MEDICATIONS: Charted for review  SLEEP ARCHITECTURE: Split study protocol. During the diagnostic phase, total sleep time 119.5 minutes with sleep efficiency 69.3%. Stage I was 4.2%, stage II 66.5%, stage III absent, REM 29.3% of total sleep time. Sleep latency 30 minutes, REM latency 107.5 minutes, awake after sleep onset 22.5 minutes, arousal index 16.1, bedtime medication: None  RESPIRATORY DATA: Apnea hypopnea index (AHI) 28.6 per hour. 57 total events scored including 3 obstructive apneas and 54 hypopneas. Events were more common while supine. REM AHI 54.9 per hour. CPAP titration to 9 CWP, AHI 1.4 per hour.  OXYGEN DATA: Moderate snoring before CPAP with oxygen desaturation to a nadir of 75% on room air. With CPAP control, oxygen saturation was 93.7% and snoring was prevented.  CARDIAC DATA: Sinus rhythm with occasional PAC  MOVEMENT/PARASOMNIA: No significant movement disturbance, bathroom 1  IMPRESSION/ RECOMMENDATION:   1) Moderate obstructive sleep apnea/hypopnea syndrome, AHI 28.6 per hour with mostly supine events. REM AHI 54.9 per hour. Moderate snoring with oxygen desaturation to a nadir of 75% on room air. 2) Successful CPAP titration to 9 CWP, AHI 1.4 per hour. She wore a ResMed AirFit mask with heated humidifier. Snoring was prevented and mean oxygen saturation was 93.7% on room air.   Deneise Lever Diplomate, American Board of Sleep Medicine  ELECTRONICALLY SIGNED ON:  03/25/2014,  9:37 AM [place] PH: [phone]   FX: [phone] [place]

## 2014-04-21 ENCOUNTER — Encounter: Payer: Self-pay | Admitting: Family Medicine

## 2014-05-01 ENCOUNTER — Other Ambulatory Visit: Payer: Self-pay

## 2014-05-01 MED ORDER — BENAZEPRIL HCL 40 MG PO TABS: 40.0000 mg | ORAL_TABLET | Freq: Every day | ORAL | Status: DC

## 2014-05-02 ENCOUNTER — Ambulatory Visit (INDEPENDENT_AMBULATORY_CARE_PROVIDER_SITE_OTHER): Payer: BC Managed Care – PPO | Admitting: Family Medicine

## 2014-05-02 ENCOUNTER — Encounter: Payer: Self-pay | Admitting: Family Medicine

## 2014-05-02 VITALS — BP 128/74 | HR 72 | Temp 98.4°F | Wt 347.8 lb

## 2014-05-02 DIAGNOSIS — IMO0002 Reserved for concepts with insufficient information to code with codable children: Secondary | ICD-10-CM

## 2014-05-02 DIAGNOSIS — E1065 Type 1 diabetes mellitus with hyperglycemia: Secondary | ICD-10-CM

## 2014-05-02 DIAGNOSIS — I10 Essential (primary) hypertension: Secondary | ICD-10-CM

## 2014-05-02 DIAGNOSIS — E1069 Type 1 diabetes mellitus with other specified complication: Secondary | ICD-10-CM

## 2014-05-02 DIAGNOSIS — E108 Type 1 diabetes mellitus with unspecified complications: Principal | ICD-10-CM

## 2014-05-02 DIAGNOSIS — M170 Bilateral primary osteoarthritis of knee: Secondary | ICD-10-CM

## 2014-05-02 LAB — POCT URINALYSIS DIPSTICK
Bilirubin, UA: NEGATIVE
Blood, UA: NEGATIVE
GLUCOSE UA: NEGATIVE
Ketones, UA: NEGATIVE
LEUKOCYTES UA: NEGATIVE
Nitrite, UA: NEGATIVE
Spec Grav, UA: >=1.03
UROBILINOGEN UA: NEGATIVE
pH, UA: 6

## 2014-05-02 MED ORDER — LIRAGLUTIDE 18 MG/3ML ~~LOC~~ SOPN: 1.8000 mg | PEN_INJECTOR | Freq: Every day | SUBCUTANEOUS | Status: DC

## 2014-05-02 MED ORDER — BENAZEPRIL HCL 40 MG PO TABS: 40.0000 mg | ORAL_TABLET | Freq: Every day | ORAL | Status: DC

## 2014-05-02 MED ORDER — DICLOFENAC SODIUM 1 % TD GEL: 2.0000 g | Freq: Four times a day (QID) | TRANSDERMAL | Status: DC

## 2014-05-02 NOTE — Progress Notes (Addendum)
   Subjective:    Patient ID: Tammy [name], female    [name]: 05/22/1960, 53 y.o.   MRN: [id number]  HPI Pt here to f/u and to also discuss bariatric surgery.  She has attempted many things for weight loss and has not been successful.  Pt is not exercising.   HPI HYPERTENSION  Blood pressure range-good  Chest pain- no      Dyspnea- no Lightheadedness- no   Edema- no Other side effects - no   Medication compliance: good Low salt diet- yes  DIABETES  Blood Sugar ranges-98-115  Polyuria- no New Visual problems- no Hypoglycemic symptoms- no Other side effects-no Medication compliance - good Last eye exam- 02/2014 Foot exam- today  Review of Systems  Constitutional: Negative for diaphoresis, appetite change, fatigue and unexpected weight change.  Eyes: Negative for pain, redness and visual disturbance.  Respiratory: Negative for cough, chest tightness, shortness of breath and wheezing.   Cardiovascular: Negative for chest pain, palpitations and leg swelling.  Endocrine: Negative for cold intolerance, heat intolerance, polydipsia, polyphagia and polyuria.  Genitourinary: Negative for dysuria, frequency and difficulty urinating.  Neurological: Negative for dizziness, light-headedness, numbness and headaches.       Objective:   Physical Exam  BP 128/74 mmHg  Pulse 72  Temp(Src) 98.4 F (36.9 C) (Oral)  Wt 347 lb 12.8 oz (157.761 kg)  SpO2 97% General appearance: alert, cooperative, appears stated age and no distress Eyes: conjunctivae/corneas clear. PERRL, EOM's intact. Fundi benign. Throat: lips, mucosa, and tongue normal; teeth and gums normal Neck: no adenopathy, supple, symmetrical, trachea midline and thyroid not enlarged, symmetric, no tenderness/mass/nodules Lungs: clear to auscultation bilaterally Heart: S1, S2 normal Extremities: extremities normal, atraumatic, no cyanosis or edema Sensory exam of the foot is normal, tested with the monofilament. Good pulses, no  lesions or ulcers, good peripheral pulses.     Assessment & Plan:  1. Type I diabetes mellitus with complication, uncontrolled con't meds Check labs - Liraglutide (VICTOZA) 18 MG/3ML SOPN; Inject 1.8 mg into the skin daily.  Dispense: 9 pen; Refill: 3 - Basic metabolic panel - Hemoglobin A1c - Hepatic function panel - Lipid panel - POCT urinalysis dipstick  2. Essential hypertension Stable, con't meds - benazepril (LOTENSIN) 40 MG tablet; Take 1 tablet (40 mg total) by mouth daily.  Dispense: 90 tablet; Refill: 0 - CBC with Differential - Hepatic function panel - Lipid panel - POCT urinalysis dipstick  3. Primary osteoarthritis of both knees  - diclofenac sodium (VOLTAREN) 1 % GEL; Apply 2 g topically 4 (four) times daily.  Dispense: 100 g; Refill: 2  4.  Morbid obesity-- discussed diet and exercise                                  Pt is interested in bariatric surgery

## 2014-05-02 NOTE — Patient Instructions (Signed)

## 2014-05-02 NOTE — Progress Notes (Signed)
Pre visit review using our clinic review tool, if applicable. No additional management support is needed unless otherwise documented below in the visit note. 

## 2014-05-03 LAB — CBC WITH DIFFERENTIAL/PLATELET
BASOS ABS: 0 10*3/uL (ref 0.0–0.1)
Basophils Relative: 0.6 % (ref 0.0–3.0)
Eosinophils Absolute: 0.1 10*3/uL (ref 0.0–0.7)
Eosinophils Relative: 2.1 % (ref 0.0–5.0)
HCT: 40.1 % (ref 36.0–46.0)
Hemoglobin: 13.1 g/dL (ref 12.0–15.0)
LYMPHS ABS: 1.5 10*3/uL (ref 0.7–4.0)
LYMPHS PCT: 38.3 % (ref 12.0–46.0)
MCHC: 32.5 g/dL (ref 30.0–36.0)
MCV: 88.1 fl (ref 78.0–100.0)
MONOS PCT: 5.2 % (ref 3.0–12.0)
Monocytes Absolute: 0.2 10*3/uL (ref 0.1–1.0)
NEUTROS PCT: 53.8 % (ref 43.0–77.0)
Neutro Abs: 2.1 10*3/uL (ref 1.4–7.7)
PLATELETS: 254 10*3/uL (ref 150.0–400.0)
RBC: 4.55 Mil/uL (ref 3.87–5.11)
RDW: 14.5 % (ref 11.5–15.5)
WBC: 4 10*3/uL (ref 4.0–10.5)

## 2014-05-03 LAB — LIPID PANEL
CHOLESTEROL: 207 mg/dL — AB (ref 0–200)
HDL: 69.8 mg/dL (ref >39.00)
LDL Cholesterol: 126 mg/dL — ABNORMAL HIGH (ref 0–99)
NONHDL: 137.2
Total CHOL/HDL Ratio: 3
Triglycerides: 57 mg/dL (ref 0.0–149.0)
VLDL: 11.4 mg/dL (ref 0.0–40.0)

## 2014-05-03 LAB — BASIC METABOLIC PANEL
BUN: 14 mg/dL (ref 6–23)
CO2: 24 meq/L (ref 19–32)
CREATININE: 0.9 mg/dL (ref 0.4–1.2)
Calcium: 8.8 mg/dL (ref 8.4–10.5)
Chloride: 107 mEq/L (ref 96–112)
GFR: 82.05 mL/min (ref >60.00)
GLUCOSE: 80 mg/dL (ref 70–99)
Potassium: 4 mEq/L (ref 3.5–5.1)
Sodium: 141 mEq/L (ref 135–145)

## 2014-05-03 LAB — HEPATIC FUNCTION PANEL
ALBUMIN: 4.2 g/dL (ref 3.5–5.2)
ALT: 22 U/L (ref 0–35)
AST: 20 U/L (ref 0–37)
Alkaline Phosphatase: 87 U/L (ref 39–117)
BILIRUBIN DIRECT: 0 mg/dL (ref 0.0–0.3)
TOTAL PROTEIN: 8 g/dL (ref 6.0–8.3)
Total Bilirubin: 0.3 mg/dL (ref 0.2–1.2)

## 2014-05-03 LAB — HEMOGLOBIN A1C: Hgb A1c MFr Bld: 6.1 % (ref 4.6–6.5)

## 2014-05-25 ENCOUNTER — Telehealth: Payer: Self-pay | Admitting: Family Medicine

## 2014-05-25 ENCOUNTER — Other Ambulatory Visit: Payer: Self-pay | Admitting: Family Medicine

## 2014-05-25 DIAGNOSIS — K219 Gastro-esophageal reflux disease without esophagitis: Secondary | ICD-10-CM

## 2014-05-25 DIAGNOSIS — M170 Bilateral primary osteoarthritis of knee: Secondary | ICD-10-CM

## 2014-05-25 MED ORDER — PANTOPRAZOLE SODIUM 40 MG PO TBEC: 40.0000 mg | DELAYED_RELEASE_TABLET | Freq: Every day | ORAL | Status: DC

## 2014-05-25 MED ORDER — GLUCOSE BLOOD VI STRP: ORAL_STRIP | Status: DC

## 2014-05-25 MED ORDER — DICLOFENAC SODIUM 1 % TD GEL: 2.0000 g | Freq: Four times a day (QID) | TRANSDERMAL | Status: DC

## 2014-05-25 NOTE — Telephone Encounter (Signed)
Caller name:Iya Mcguinn Relationship to patient:self Can be reached:[phone] Pharmacy:[facility]   Reason for call: Pt requesting call back regarding RX

## 2014-05-25 NOTE — Telephone Encounter (Signed)
Spoke with patient and she requested a New Rx to replace the Nexium because it is 400 dollars and she can not afford to pay for it.  Please advise      KP

## 2014-05-25 NOTE — Telephone Encounter (Signed)
I sent some protonix but if its too expensive--- I will need formulary

## 2014-05-29 ENCOUNTER — Encounter: Payer: 59 | Attending: Surgery | Admitting: Dietician

## 2014-05-29 DIAGNOSIS — Z713 Dietary counseling and surveillance: Secondary | ICD-10-CM | POA: Insufficient documentation

## 2014-05-29 DIAGNOSIS — Z6841 Body Mass Index (BMI) 40.0 and over, adult: Secondary | ICD-10-CM | POA: Diagnosis not present

## 2014-05-29 NOTE — Telephone Encounter (Signed)
[place]           Please call Demeta at [facility] health nutrition dept. [phone]

## 2014-05-29 NOTE — Telephone Encounter (Signed)
Spoke with Tammy [name] and she said the patient was seen on 05/02/14 but there is no information documented about her Morbid obesity or a diagnosis, she wanted to know if you would addendum that visit and document the morbid obesity as well as what you guys discussed in reference to her weight loss, so the insurance company can pay for the bariatric surgery and we will not have to repeat the visit.Marland Kitchen       KP

## 2014-05-29 NOTE — Progress Notes (Signed)
  Medical Nutrition Therapy:  Appt start time: 9702 end time:  6378.  Supervised Weight Loss:  Marvel is here today for her 1st SWL visit out of 6 in preparation for gastric sleeve surgery. She states that she has been working on eating smaller, more frequent meals. Has also tried a few different kinds of approved protein shakes. Recently got a membership at the gym. Has a lot of family support.    Plan: continue working on pre op goals.  Wt Readings from Last 3 Encounters:  05/29/14 346 lb (156.945 kg)  05/02/14 347 lb 12.8 oz (157.761 kg)  03/20/14 347 lb (157.398 kg)   Ht Readings from Last 3 Encounters:  03/20/14 5\' 6"  (1.676 m)  02/11/14 5\' 6"  (1.676 m)  02/02/14 5\' 6"  (1.676 m)   Body mass index is 55.87 kg/(m^2). @BMIFA @ Normalized weight-for-age data available only for age 63 to 57 years. Normalized stature-for-age data available only for age 63 to 1 years.   MEDICATIONS: see list  Recent physical activity: not assessed  Estimated energy needs: 1400-1600 calories  Progress Towards Goal(s):  In progress.   Nutritional Diagnosis:  [place]-3.3 Overweight/obesity related to past poor dietary habits and physical inactivity as evidenced by patient w/ pending bariatric surgery following dietary guidelines for continued weight loss.     Intervention:  Nutrition counseling provided.  Monitoring/Evaluation:  Dietary intake, exercise, and body weight in 4 week(s).

## 2014-05-29 NOTE — Assessment & Plan Note (Signed)
Pt to go to [facility] for info mtg D/ w pt diet and exercise

## 2014-05-29 NOTE — Telephone Encounter (Signed)
done

## 2014-05-29 NOTE — Telephone Encounter (Signed)
Tammy [name] has been made aware.      KP

## 2014-06-19 ENCOUNTER — Other Ambulatory Visit: Payer: Self-pay

## 2014-06-19 DIAGNOSIS — E1065 Type 1 diabetes mellitus with hyperglycemia: Secondary | ICD-10-CM

## 2014-06-19 DIAGNOSIS — E108 Type 1 diabetes mellitus with unspecified complications: Principal | ICD-10-CM

## 2014-06-19 DIAGNOSIS — IMO0002 Reserved for concepts with insufficient information to code with codable children: Secondary | ICD-10-CM

## 2014-06-19 MED ORDER — LIRAGLUTIDE 18 MG/3ML ~~LOC~~ SOPN
1.8000 mg | PEN_INJECTOR | Freq: Every day | SUBCUTANEOUS | Status: DC
Start: 2014-06-19 — End: 2014-08-17

## 2014-06-26 ENCOUNTER — Encounter: Payer: 59 | Attending: Surgery | Admitting: Dietician

## 2014-06-26 DIAGNOSIS — Z6841 Body Mass Index (BMI) 40.0 and over, adult: Secondary | ICD-10-CM | POA: Insufficient documentation

## 2014-06-26 DIAGNOSIS — Z713 Dietary counseling and surveillance: Secondary | ICD-10-CM | POA: Diagnosis not present

## 2014-06-26 NOTE — Progress Notes (Signed)
  Medical Nutrition Therapy:  Appt start time: 1200 end time:  1215.  Supervised Weight Loss:  Tammy [name] is here today for her 2nd SWL visit out of 6 in preparation for gastric sleeve surgery. Has gained a few pounds since last visit. Has completed all other requirements for bariatric surgery. She reports she has been trying to walk on the treadmill 2-3x a week. Sometimes has knee pain and is required to stand most of the time at work. Likes strawberry flavored Premier protein shake and vanilla Atkins shake. Has had difficulty eating small meals throughout the day as her dad has been [place] of state.    Plan: Practice chewing thoroughly and work on eating more frequently throughout the day  Wt Readings from Last 3 Encounters:  06/26/14 351 lb (159.213 kg)  05/29/14 346 lb (156.945 kg)  05/02/14 347 lb 12.8 oz (157.761 kg)   Ht Readings from Last 3 Encounters:  03/20/14 5\' 6"  (1.676 m)  02/11/14 5\' 6"  (1.676 m)  02/02/14 5\' 6"  (1.676 m)   There is no weight on file to calculate BMI. @BMIFA @ Normalized weight-for-age data available only for age 44 to 57 years. Normalized stature-for-age data available only for age 44 to 26 years.   MEDICATIONS: see list  Recent physical activity: not assessed  Estimated energy needs: 1400-1600 calories  Progress Towards Goal(s):  In progress.   Nutritional Diagnosis:  [place]-3.3 Overweight/obesity related to past poor dietary habits and physical inactivity as evidenced by patient w/ pending bariatric surgery following dietary guidelines for continued weight loss.     Intervention:  Nutrition counseling provided.  Monitoring/Evaluation:  Dietary intake, exercise, and body weight in 4 week(s).

## 2014-07-24 ENCOUNTER — Ambulatory Visit: Payer: 59 | Admitting: Dietician

## 2014-07-25 ENCOUNTER — Encounter: Payer: 59 | Attending: Surgery | Admitting: Dietician

## 2014-07-25 DIAGNOSIS — Z6841 Body Mass Index (BMI) 40.0 and over, adult: Secondary | ICD-10-CM | POA: Insufficient documentation

## 2014-07-25 DIAGNOSIS — Z713 Dietary counseling and surveillance: Secondary | ICD-10-CM | POA: Insufficient documentation

## 2014-07-25 NOTE — Patient Instructions (Signed)
-  Try steamer bags of veggies -Add protein to breakfast: boiled egg

## 2014-07-25 NOTE — Progress Notes (Signed)
  Medical Nutrition Therapy:  Appt start time: 810 end time:  825.  Supervised Weight Loss:  Tammy [name] is here today for her 3rd SWL visit out of 6 in preparation for gastric sleeve surgery. Has lost 8 pounds since last month. She has been working on eating slowly and more frequently (5x a day). She has noticed that when she talks while she eats she doesn't chew as thoroughly. She is trying to be mindful of hunger/fullness cues. She reports being cold frequently.   Plan:  -Practice chewing thoroughly  -Try steamer bags of veggies -Add protein to breakfast: boiled egg  Wt Readings from Last 3 Encounters:  07/25/14 343 lb (155.584 kg)  06/26/14 351 lb (159.213 kg)  05/29/14 346 lb (156.945 kg)   Ht Readings from Last 3 Encounters:  03/20/14 5\' 6"  (1.676 m)  02/11/14 5\' 6"  (1.676 m)  02/02/14 5\' 6"  (1.676 m)   There is no weight on file to calculate BMI. @BMIFA @ Normalized weight-for-age data available only for age 68 to 87 years. Normalized stature-for-age data available only for age 68 to 5 years.   MEDICATIONS: see list  B: 3 peanut butter crackers with water Snk: banana and 3 peanut butter crackers L: grits, an egg, and toast Snk: [place], lettuce, and tomato sandwich with fruit Snk: yogurt with granola D: baked chicken with rice and green beans  Beverages: water throughout the day, fruit punch  Recent physical activity: treadmill 2x a week  Estimated energy needs: 1400-1600 calories  Progress Towards Goal(s):  In progress.   Nutritional Diagnosis:  [place]-3.3 Overweight/obesity related to past poor dietary habits and physical inactivity as evidenced by patient w/ pending bariatric surgery following dietary guidelines for continued weight loss.     Intervention:  Nutrition counseling provided.  Monitoring/Evaluation:  Dietary intake, exercise, and body weight in 4 week(s).

## 2014-08-08 ENCOUNTER — Emergency Department (HOSPITAL_BASED_OUTPATIENT_CLINIC_OR_DEPARTMENT_OTHER): Payer: 59

## 2014-08-08 ENCOUNTER — Emergency Department (HOSPITAL_BASED_OUTPATIENT_CLINIC_OR_DEPARTMENT_OTHER)
Admission: EM | Admit: 2014-08-08 | Discharge: 2014-08-08 | Disposition: A | Discharge status: Home / Self Care | Payer: 59 | Attending: Emergency Medicine | Admitting: Emergency Medicine

## 2014-08-08 ENCOUNTER — Encounter (HOSPITAL_BASED_OUTPATIENT_CLINIC_OR_DEPARTMENT_OTHER): Payer: Self-pay | Admitting: *Deleted

## 2014-08-08 DIAGNOSIS — K219 Gastro-esophageal reflux disease without esophagitis: Secondary | ICD-10-CM | POA: Insufficient documentation

## 2014-08-08 DIAGNOSIS — R1030 Lower abdominal pain, unspecified: Secondary | ICD-10-CM | POA: Diagnosis present

## 2014-08-08 DIAGNOSIS — Z8701 Personal history of pneumonia (recurrent): Secondary | ICD-10-CM | POA: Insufficient documentation

## 2014-08-08 DIAGNOSIS — R188 Other ascites: Secondary | ICD-10-CM | POA: Insufficient documentation

## 2014-08-08 DIAGNOSIS — Z79899 Other long term (current) drug therapy: Secondary | ICD-10-CM | POA: Insufficient documentation

## 2014-08-08 DIAGNOSIS — I10 Essential (primary) hypertension: Secondary | ICD-10-CM | POA: Diagnosis not present

## 2014-08-08 DIAGNOSIS — K529 Noninfective gastroenteritis and colitis, unspecified: Secondary | ICD-10-CM | POA: Insufficient documentation

## 2014-08-08 DIAGNOSIS — Z794 Long term (current) use of insulin: Secondary | ICD-10-CM | POA: Diagnosis not present

## 2014-08-08 DIAGNOSIS — R109 Unspecified abdominal pain: Secondary | ICD-10-CM

## 2014-08-08 DIAGNOSIS — N838 Other noninflammatory disorders of ovary, fallopian tube and broad ligament: Secondary | ICD-10-CM | POA: Diagnosis not present

## 2014-08-08 DIAGNOSIS — Z7951 Long term (current) use of inhaled steroids: Secondary | ICD-10-CM | POA: Diagnosis not present

## 2014-08-08 DIAGNOSIS — Z90710 Acquired absence of both cervix and uterus: Secondary | ICD-10-CM | POA: Insufficient documentation

## 2014-08-08 DIAGNOSIS — E119 Type 2 diabetes mellitus without complications: Secondary | ICD-10-CM | POA: Insufficient documentation

## 2014-08-08 LAB — CBC WITH DIFFERENTIAL/PLATELET
BASOS PCT: 0 % (ref 0–1)
Basophils Absolute: 0 10*3/uL (ref 0.0–0.1)
Eosinophils Absolute: 0 10*3/uL (ref 0.0–0.7)
Eosinophils Relative: 0 % (ref 0–5)
HCT: 41.6 % (ref 36.0–46.0)
Hemoglobin: 13.5 g/dL (ref 12.0–15.0)
Lymphocytes Relative: 9 % — ABNORMAL LOW (ref 12–46)
Lymphs Abs: 0.6 10*3/uL — ABNORMAL LOW (ref 0.7–4.0)
MCH: 28.4 pg (ref 26.0–34.0)
MCHC: 32.5 g/dL (ref 30.0–36.0)
MCV: 87.6 fL (ref 78.0–100.0)
Monocytes Absolute: 0.1 10*3/uL (ref 0.1–1.0)
Monocytes Relative: 2 % — ABNORMAL LOW (ref 3–12)
Neutro Abs: 5.3 10*3/uL (ref 1.7–7.7)
Neutrophils Relative %: 89 % — ABNORMAL HIGH (ref 43–77)
PLATELETS: 238 10*3/uL (ref 150–400)
RBC: 4.75 MIL/uL (ref 3.87–5.11)
RDW: 13.9 % (ref 11.5–15.5)
WBC: 6 10*3/uL (ref 4.0–10.5)

## 2014-08-08 LAB — COMPREHENSIVE METABOLIC PANEL
ALBUMIN: 3.5 g/dL (ref 3.5–5.2)
ALK PHOS: 61 U/L (ref 39–117)
ALT: 13 U/L (ref 0–35)
ANION GAP: 8 (ref 5–15)
AST: 15 U/L (ref 0–37)
BILIRUBIN TOTAL: 0.2 mg/dL — AB (ref 0.3–1.2)
BUN: 12 mg/dL (ref 6–23)
CHLORIDE: 104 mmol/L (ref 96–112)
CO2: 25 mmol/L (ref 19–32)
Calcium: 8 mg/dL — ABNORMAL LOW (ref 8.4–10.5)
Creatinine, Ser: 0.93 mg/dL (ref 0.50–1.10)
GFR calc Af Amer: 80 mL/min — ABNORMAL LOW (ref >90)
GFR calc non Af Amer: 69 mL/min — ABNORMAL LOW (ref >90)
Glucose, Bld: 152 mg/dL — ABNORMAL HIGH (ref 70–99)
Potassium: 3.6 mmol/L (ref 3.5–5.1)
SODIUM: 137 mmol/L (ref 135–145)
Total Protein: 6.8 g/dL (ref 6.0–8.3)

## 2014-08-08 LAB — LIPASE, BLOOD: LIPASE: 32 U/L (ref 11–59)

## 2014-08-08 LAB — URINALYSIS, ROUTINE W REFLEX MICROSCOPIC
BILIRUBIN URINE: NEGATIVE
Glucose, UA: NEGATIVE mg/dL
HGB URINE DIPSTICK: NEGATIVE
KETONES UR: 15 mg/dL — AB
LEUKOCYTES UA: NEGATIVE
Nitrite: NEGATIVE
PH: 7 (ref 5.0–8.0)
Protein, ur: NEGATIVE mg/dL
Specific Gravity, Urine: 1.023 (ref 1.005–1.030)
Urobilinogen, UA: 0.2 mg/dL (ref 0.0–1.0)

## 2014-08-08 LAB — CBG MONITORING, ED: Glucose-Capillary: 130 mg/dL — ABNORMAL HIGH (ref 70–99)

## 2014-08-08 MED ORDER — IOHEXOL 300 MG/ML  SOLN
100.0000 mL | Freq: Once | INTRAMUSCULAR | Status: AC | PRN
  Administered 2014-08-08: 100 mL via INTRAVENOUS

## 2014-08-08 MED ORDER — SODIUM CHLORIDE 0.9 % IV SOLN
1000.0000 mL | INTRAVENOUS | Status: DC
  Administered 2014-08-08: 1000 mL via INTRAVENOUS

## 2014-08-08 MED ORDER — SODIUM CHLORIDE 0.9 % IV SOLN
1000.0000 mL | Freq: Once | INTRAVENOUS | Status: AC
  Administered 2014-08-08: 1000 mL via INTRAVENOUS

## 2014-08-08 MED ORDER — IOHEXOL 300 MG/ML  SOLN
25.0000 mL | Freq: Once | INTRAMUSCULAR | Status: AC | PRN
  Administered 2014-08-08: 25 mL via ORAL

## 2014-08-08 MED ORDER — ONDANSETRON HCL 4 MG/2ML IJ SOLN
4.0000 mg | Freq: Once | INTRAMUSCULAR | Status: AC
  Administered 2014-08-08: 4 mg via INTRAVENOUS
  Filled 2014-08-08: qty 2

## 2014-08-08 MED ORDER — ONDANSETRON 4 MG PO TBDP: ORAL_TABLET | ORAL | Status: DC

## 2014-08-08 MED ORDER — HYDROMORPHONE HCL 1 MG/ML IJ SOLN
1.0000 mg | INTRAMUSCULAR | Status: AC | PRN
  Administered 2014-08-08 (×2): 1 mg via INTRAVENOUS
  Filled 2014-08-08 (×2): qty 1

## 2014-08-08 NOTE — Discharge Instructions (Signed)
Ascites °Ascites is a gathering of fluid in the belly (abdomen). This is most often caused by liver disease. It may also be caused by a number of other less common problems. It causes a ballooning out (distension) of the abdomen. °CAUSES  °Scarring of the liver (cirrhosis) is the most common cause of ascites. Other causes include: °· Infection or inflammation in the abdomen. °· Cancer in the abdomen. °· Heart failure. °· Certain forms of kidney failure (nephritic syndrome). °· Inflammation of the pancreas. °· Clots in the veins of the liver. °SYMPTOMS  °In the early stages of ascites, you may not have any symptoms. The main symptom of ascites is a sense of abdominal bloating. This is due to the presence of fluid. This may also cause an increase in abdominal or waist size. People with this condition can develop swelling in the legs, and men can develop a swollen scrotum. When there is a lot of fluid, it may be hard to breath. Stretching of the abdomen by fluid can be painful. °DIAGNOSIS  °Certain features of your medical history, such as a history of liver disease and of an enlarging abdomen, can suggest the presence of ascites. The diagnosis of ascites can be made on physical exam by your caregiver. An abdominal ultrasound examination can confirm that ascites is present, and estimate the amount of fluid. °Once ascites is confirmed, it is important to determine its cause. Again, a history of one of the conditions listed in "CAUSES" provides a strong clue. A physical exam is important, and blood and X-ray tests may be needed. During a procedure called paracentesis, a sample of fluid is removed from the abdomen. This can determine certain key features about the fluid, such as whether or not infection or cancer is present. Your caregiver will determine if a paracentesis is necessary. They will describe the procedure to you. °PREVENTION  °Ascites is a complication of other conditions. Therefore to prevent ascites, you  must seek treatment for any significant health conditions you have. Once ascites is present, careful attention to fluid and salt intake may help prevent it from getting worse. If you have ascites, you should not drink alcohol. °PROGNOSIS  °The prognosis of ascites depends on the underlying disease. If the disease is reversible, such as with certain infections or with heart failure, then ascites may improve or disappear. When ascites is caused by cirrhosis, then it indicates that the liver disease has worsened, and further evaluation and treatment of the liver disease is needed. If your ascites is caused by cancer, then the success or failure of the cancer treatment will determine whether your ascites will improve or worsen. °RISKS AND COMPLICATIONS  °Ascites is likely to worsen if it is not properly diagnosed and treated. A large amount of ascites can cause pain and difficulty breathing. The main complication, besides worsening, is infection (called spontaneous bacterial peritonitis). This requires prompt treatment. °TREATMENT  °The treatment of ascites depends on its cause. When liver disease is your cause, medical management using water pills (diuretics) and decreasing salt intake is often effective. Ascites due to peritoneal inflammation or malignancy (cancer) alone does not respond to salt restriction and diuretics. Hospitalization is sometimes required. °If the treatment of ascites cannot be managed with medications, a number of other treatments are available. Your caregivers will help you decide which will work best for you. Some of these are: °· Removal of fluid from the abdomen (paracentesis). °· Fluid from the abdomen is passed into a vein (peritoneovenous shunting). °·   Liver transplantation.  Transjugular intrahepatic portosystemic stent shunt. HOME CARE INSTRUCTIONS  It is important to monitor body weight and the intake and output of fluids. Weigh yourself at the same time every day. Record your  weights. Fluid restriction may be necessary. It is also important to know your salt intake. The more salt you take in, the more fluid you will retain. Ninety percent of people with ascites respond to this approach.  Follow any directions for medicines carefully.  Follow up with your caregiver, as directed.  Report any changes in your health, especially any new or worsening symptoms.  If your ascites is from liver disease, avoid alcohol and other substances toxic to the liver. SEEK MEDICAL CARE IF:   Your weight increases more than a few pounds in a few days.  Your abdominal or waist size increases.  You develop swelling in your legs.  You had swelling and it worsens. SEEK IMMEDIATE MEDICAL CARE IF:   You develop a fever.  You develop new abdominal pain.  You develop difficulty breathing.  You develop confusion.  You have bleeding from the mouth, stomach, or rectum. MAKE SURE YOU:   Understand these instructions.  Will watch your condition.  Will get help right away if you are not doing well or get worse. Document Released: 04/21/2005 Document Revised: 07/14/2011 Document Reviewed: 11/20/2006 [facility] Patient Information 2015 [place], [place]. This information is not intended to replace advice given to you by your health care provider. Make sure you discuss any questions you have with your health care provider.  Viral Gastroenteritis Viral gastroenteritis is also known as stomach flu. This condition affects the stomach and intestinal tract. It can cause sudden diarrhea and vomiting. The illness typically lasts 3 to 8 days. Most people develop an immune response that eventually gets rid of the virus. While this natural response develops, the virus can make you quite ill. CAUSES  Many different viruses can cause gastroenteritis, such as rotavirus or noroviruses. You can catch one of these viruses by consuming contaminated food or water. You may also catch a virus by sharing  utensils or other personal items with an infected person or by touching a contaminated surface. SYMPTOMS  The most common symptoms are diarrhea and vomiting. These problems can cause a severe loss of body fluids (dehydration) and a body salt (electrolyte) imbalance. Other symptoms may include:  Fever.  Headache.  Fatigue.  Abdominal pain. DIAGNOSIS  Your caregiver can usually diagnose viral gastroenteritis based on your symptoms and a physical exam. A stool sample may also be taken to test for the presence of viruses or other infections. TREATMENT  This illness typically goes away on its own. Treatments are aimed at rehydration. The most serious cases of viral gastroenteritis involve vomiting so severely that you are not able to keep fluids down. In these cases, fluids must be given through an intravenous line (IV). HOME CARE INSTRUCTIONS   Drink enough fluids to keep your urine clear or pale yellow. Drink small amounts of fluids frequently and increase the amounts as tolerated.  Ask your caregiver for specific rehydration instructions.  Avoid:  Foods high in sugar.  Alcohol.  Carbonated drinks.  Tobacco.  Juice.  Caffeine drinks.  Extremely hot or cold fluids.  Fatty, greasy foods.  Too much intake of anything at one time.  Dairy products until 24 to 48 hours after diarrhea stops.  You may consume probiotics. Probiotics are active cultures of beneficial bacteria. They may lessen the amount and number  of diarrheal stools in adults. Probiotics can be found in yogurt with active cultures and in supplements.  Wash your hands well to avoid spreading the virus.  Only take over-the-counter or prescription medicines for pain, discomfort, or fever as directed by your caregiver. Do not give aspirin to children. Antidiarrheal medicines are not recommended.  Ask your caregiver if you should continue to take your regular prescribed and over-the-counter medicines.  Keep all  follow-up appointments as directed by your caregiver. SEEK IMMEDIATE MEDICAL CARE IF:   You are unable to keep fluids down.  You do not urinate at least once every 6 to 8 hours.  You develop shortness of breath.  You notice blood in your stool or vomit. This may look like coffee grounds.  You have abdominal pain that increases or is concentrated in one small area (localized).  You have persistent vomiting or diarrhea.  You have a fever.  The patient is a child younger than 3 months, and he or she has a fever.  The patient is a child older than 3 months, and he or she has a fever and persistent symptoms.  The patient is a child older than 3 months, and he or she has a fever and symptoms suddenly get worse.  The patient is a baby, and he or she has no tears when crying. MAKE SURE YOU:   Understand these instructions.  Will watch your condition.  Will get help right away if you are not doing well or get worse. Document Released: 04/21/2005 Document Revised: 07/14/2011 Document Reviewed: 02/05/2011 [facility] Patient Information 2015 [place], [place]. This information is not intended to replace advice given to you by your health care provider. Make sure you discuss any questions you have with your health care provider.

## 2014-08-08 NOTE — ED Provider Notes (Signed)
Results for orders placed or performed during the hospital encounter of 08/08/14  CBC WITH DIFFERENTIAL  Result Value Ref Range   WBC 6.0 4.0 - 10.5 K/uL   RBC 4.75 3.87 - 5.11 MIL/uL   Hemoglobin 13.5 12.0 - 15.0 g/dL   HCT 41.6 36.0 - 46.0 %   MCV 87.6 78.0 - 100.0 fL   MCH 28.4 26.0 - 34.0 pg   MCHC 32.5 30.0 - 36.0 g/dL   RDW 13.9 11.5 - 15.5 %   Platelets 238 150 - 400 K/uL   Neutrophils Relative % 89 (H) 43 - 77 %   Neutro Abs 5.3 1.7 - 7.7 K/uL   Lymphocytes Relative 9 (L) 12 - 46 %   Lymphs Abs 0.6 (L) 0.7 - 4.0 K/uL   Monocytes Relative 2 (L) 3 - 12 %   Monocytes Absolute 0.1 0.1 - 1.0 K/uL   Eosinophils Relative 0 0 - 5 %   Eosinophils Absolute 0.0 0.0 - 0.7 K/uL   Basophils Relative 0 0 - 1 %   Basophils Absolute 0.0 0.0 - 0.1 K/uL  Comprehensive metabolic panel  Result Value Ref Range   Sodium 137 135 - 145 mmol/L   Potassium 3.6 3.5 - 5.1 mmol/L   Chloride 104 96 - 112 mmol/L   CO2 25 19 - 32 mmol/L   Glucose, Bld 152 (H) 70 - 99 mg/dL   BUN 12 6 - 23 mg/dL   Creatinine, Ser 0.93 0.50 - 1.10 mg/dL   Calcium 8.0 (L) 8.4 - 10.5 mg/dL   Total Protein 6.8 6.0 - 8.3 g/dL   Albumin 3.5 3.5 - 5.2 g/dL   AST 15 0 - 37 U/L   ALT 13 0 - 35 U/L   Alkaline Phosphatase 61 39 - 117 U/L   Total Bilirubin 0.2 (L) 0.3 - 1.2 mg/dL   GFR calc non Af Amer 69 (L) >90 mL/min   GFR calc Af Amer 80 (L) >90 mL/min   Anion gap 8 5 - 15  Lipase, blood  Result Value Ref Range   Lipase 32 11 - 59 U/L  Urinalysis with microscopic  Result Value Ref Range   Color, Urine YELLOW YELLOW   APPearance CLOUDY (A) CLEAR   Specific Gravity, Urine 1.023 1.005 - 1.030   pH 7.0 5.0 - 8.0   Glucose, UA NEGATIVE NEGATIVE mg/dL   Hgb urine dipstick NEGATIVE NEGATIVE   Bilirubin Urine NEGATIVE NEGATIVE   Ketones, ur 15 (A) NEGATIVE mg/dL   Protein, ur NEGATIVE NEGATIVE mg/dL   Urobilinogen, UA 0.2 0.0 - 1.0 mg/dL   Nitrite NEGATIVE NEGATIVE   Leukocytes, UA NEGATIVE NEGATIVE  CBG monitoring,  ED  Result Value Ref Range   Glucose-Capillary 130 (H) 70 - 99 mg/dL   US Transvaginal Non-ob  08/08/2014   CLINICAL DATA:  Abdominal pain, possible right ovarian enlargement on CT  EXAM: TRANSABDOMINAL AND TRANSVAGINAL ULTRASOUND OF PELVIS  TECHNIQUE: Both transabdominal and transvaginal ultrasound examinations of the pelvis were performed. Transabdominal technique was performed for global imaging of the pelvis including uterus, ovaries, adnexal regions, and pelvic cul-de-sac. It was necessary to proceed with endovaginal exam following the transabdominal exam to visualize the right adnexa.  COMPARISON:  CT abdomen pelvis dated 08/08/2014  FINDINGS: Uterus  Surgically absent.  Right ovary  Possible right ovary measures 3.3 x 2.3 x 1.7 cm.  Left ovary  Measurements: 3.6 x 1.8 x 3.6 cm. 1.7 cm simple cyst/follicle, physiologic.  Other findings  Large volume pelvic  fluid/ascites.  IMPRESSION: Status post hysterectomy.  Possible right ovary is within normal limits.  Left ovary is within normal limits.  Large volume pelvic fluid/ascites.   Electronically Signed   By: Julian Hy M.D.   On: 08/08/2014 19:22   US Pelvis Complete  08/08/2014   CLINICAL DATA:  Abdominal pain, possible right ovarian enlargement on CT  EXAM: TRANSABDOMINAL AND TRANSVAGINAL ULTRASOUND OF PELVIS  TECHNIQUE: Both transabdominal and transvaginal ultrasound examinations of the pelvis were performed. Transabdominal technique was performed for global imaging of the pelvis including uterus, ovaries, adnexal regions, and pelvic cul-de-sac. It was necessary to proceed with endovaginal exam following the transabdominal exam to visualize the right adnexa.  COMPARISON:  CT abdomen pelvis dated 08/08/2014  FINDINGS: Uterus  Surgically absent.  Right ovary  Possible right ovary measures 3.3 x 2.3 x 1.7 cm.  Left ovary  Measurements: 3.6 x 1.8 x 3.6 cm. 1.7 cm simple cyst/follicle, physiologic.  Other findings  Large volume pelvic fluid/ascites.   IMPRESSION: Status post hysterectomy.  Possible right ovary is within normal limits.  Left ovary is within normal limits.  Large volume pelvic fluid/ascites.   Electronically Signed   By: Julian Hy M.D.   On: 08/08/2014 19:22   Ct Abdomen Pelvis W Contrast  08/08/2014   CLINICAL DATA:  Generalized abdominal pain  EXAM: CT ABDOMEN AND PELVIS WITH CONTRAST  TECHNIQUE: Multidetector CT imaging of the abdomen and pelvis was performed using the standard protocol following bolus administration of intravenous contrast. Oral contrast was also administered.  CONTRAST:  54mL OMNIPAQUE IOHEXOL 300 MG/ML SOLN, 175mL OMNIPAQUE IOHEXOL 300 MG/ML SOLN  COMPARISON:  June 10, 2012  FINDINGS: There is patchy bibasilar lung atelectatic change. There is contrast in the distal esophagus, probably representing gastroesophageal reflux. There is a small hiatal hernia.  Liver is prominent, measuring 18.4 cm in length. The caudate lobe of the liver is rather prominent with relative hypoplasia of the left lobe of the liver. Question a degree of underlying cirrhosis. No focal liver lesions are identified. Gallbladder wall is not thickened. There is no appreciable biliary duct dilatation.  There are scattered small splenic granulomas. Spleen is normal in size and contour. No focal splenic lesions are identified.  Pancreas and adrenals appear stable and within normal limits. Kidneys bilaterally show no mass or hydronephrosis on either side. There is no renal or ureteral calculus on either side.  There is ascites in the pelvis extending into the upper abdomen, primarily surrounding the liver.  In the pelvis, urinary bladder is midline without wall thickening. There is no well-defined pelvic mass or pelvic fluid collection. Uterus is absent.  In the anterior lower pelvis, there is generalized soft tissue prominence which is difficult to discern from discrete loops of bowel. The possibility of ovarian enlargement in this area must be  of concern, particularly given the appearance on prior CT.  There is generalized mild small bowel dilatation. There is no transition zone. There is evidence of previous appendectomy with surgical clips in the right lower quadrant. No free air or portal venous air.  There is no abscess or adenopathy in the abdomen or pelvis. There is no abdominal aortic aneurysm. There are no blastic or lytic bone lesions. There is degenerative change in the lumbar spine.  IMPRESSION: Moderate ascites.  Soft tissue prominence anterior to the bladder in the lower pelvis. This area is difficult to delineate on this study. There is some overlying artifact due to the patient's large size. The  possibility of ovarian enlargement in this area cannot be excluded. Would advise pelvic ultrasound to better assess the ovaries given this circumstance.  The appearance of the liver raises question of a degree of underlying cirrhosis. The liver is prominent in size. No focal liver lesions are identified. Spleen is nonenlarged. No varices are appreciable.  Hiatal hernia with apparent gastroesophageal reflux.  Uterus and appendix absent.  Mild generalized small bowel dilatation without transition zone. Question a degree of ileus or possible enteritis.  No free air.  No apparent abscess.   Electronically Signed   By: Lowella Grip III M.D.   On: 08/08/2014 16:18    Patient presents with vomiting and watery diarrhea. She also has some generalized abdominal pain. Currently her abdomen is nontender on exam. She has no ongoing vomiting that she did have some ongoing nausea in the ED so she had a significantly prolonged ED stay. She initially tried some ginger ale and was still feeling nauseated so she got another dose of Zofran. She had a CT scan which showed probable enteritis but could be a degree of ileus. There is no obvious obstruction. There was a moderate amount of ascites present. Her LFTs however normal. She has no known history of liver  disease however this will need outpatient follow-up and I discussed this with the patient and her husband. There is also question of an ovarian abnormality and the radiologist recommended a pelvic ultrasound which she did have but there is no apparent ovarian abnormality on ultrasound. At this point she is able to tolerate crackers and she currently has no nausea or vomiting. She has no significant abdominal pain on exam. She feels like she is ready to go home. She was discharged home in good condition and was given prescription for Zofran. She was encouraged to have close follow-up with her primary care physician. She was advised to return here if she has any worsening pain or ongoing vomiting.  Malvin Johns, MD 08/08/14 [phone]

## 2014-08-08 NOTE — ED Notes (Signed)
MD at bedside. 

## 2014-08-08 NOTE — ED Notes (Signed)
Patient tolerating PO fluids without issues.

## 2014-08-08 NOTE — ED Notes (Signed)
C/o mid abd pain. C/o n/v/d. Onset yesterday.

## 2014-08-08 NOTE — ED Provider Notes (Signed)
CSN: [id number]     Arrival date & time 08/08/14  1207 History   First MD Initiated Contact with Patient 08/08/14 1225     Chief Complaint  Patient presents with  . Abdominal Pain   Patient is a 54 y.o. female presenting with abdominal pain. The history is provided by the patient.  Abdominal Pain Pain location: From the upper to the lower abdomen. Pain quality: cramping   Pain severity:  Severe Onset quality:  Gradual Duration:  1 day Timing:  Intermittent Chronicity:  New Relieved by:  Nothing Ineffective treatments:  None tried Associated symptoms: diarrhea, nausea and vomiting   Associated symptoms: no chest pain, no chills, no constipation and no fever    the patient's pain has been waxing and waning and at times it is very severe. She's had a few episodes of vomiting is productive of stomach contents. She said multiple episodes of loose stools. She has not noticed any blood in her vomiting or diarrhea. She has a history of a appendectomy and hysterectomy.  Past Medical History  Diagnosis Date  . GERD (gastroesophageal reflux disease)   . Hypertension   . Hyperlipidemia   . Diabetes mellitus     TYPE 2  . Pneumonia   . Sinusitis   . Acute on chronic appendicitis s/p lap appy 06/10/2012 06/10/2012  . Morbid obesity - BMI > 50 12/13/2012   Past Surgical History  Procedure Laterality Date  . Abdominal hysterectomy    . Knee surgery Right      KNEE SURGERY   . Upper gastrointestinal endoscopy  04/14/2006    Normal  . Colonoscopy  04/11/11    5 mm polyp removed but not recovered  . Laparoscopic appendectomy  06/10/2012    Procedure: APPENDECTOMY LAPAROSCOPIC;  Surgeon: Adin Hector, MD;  Location: [facility];  Service: General;  Laterality: N/A;   Family History  Problem Relation Age of Onset  . Hyperlipidemia Mother   . Hypertension Mother   . Cancer Mother 72    hodgkins lymphoma  . Diabetes Father   . Hyperlipidemia Brother   . Colon cancer Neg Hx   . Heart attack Neg Hx    . Sudden death Neg Hx   . Diabetes Paternal Grandmother    History  Substance Use Topics  . Smoking status: Never Smoker   . Smokeless tobacco: Never Used  . Alcohol Use: No   OB History    No data available     Review of Systems  Constitutional: Negative for fever and chills.  Cardiovascular: Negative for chest pain.  Gastrointestinal: Positive for nausea, vomiting, abdominal pain and diarrhea. Negative for constipation.  All other systems reviewed and are negative.     Allergies  Cheese; Red dye; and Tuna  Home Medications   Prior to Admission medications   Medication Sig Start Date End Date Taking? Authorizing Provider  benazepril (LOTENSIN) 40 MG tablet Take 1 tablet (40 mg total) by mouth daily. 05/02/14  Yes Rosalita Chessman, DO  Elastic Bandages & Supports (FUTURO SHEER SUPPORT HOSE) MISC 1 packet by Other route daily. 1 pair of support hose to wear daily 06/09/12  Yes Yvonne R Lowne, DO  fluticasone (FLONASE) 50 MCG/ACT nasal spray Place 2 sprays into both nostrils daily. 12/21/13  Yes Meriam Sprague Saguier, PA-C  furosemide (LASIX) 40 MG tablet Take 1 tablet (40 mg total) by mouth daily. 10/13/13 02/09/15 Yes Yvonne R Lowne, DO  glucose blood test strip Check blood sugar twice  daily 05/25/14  Yes Yvonne R Lowne, DO  Insulin Pen Needle (NOVOFINE) 32G X 6 MM MISC Inject 1.8 mg into the skin daily. 10/13/13  Yes Yvonne R Lowne, DO  Liraglutide (VICTOZA) 18 MG/3ML SOPN Inject 0.3 mLs (1.8 mg total) into the skin daily. 06/19/14  Yes Rosalita Chessman, DO  loratadine (CLARITIN) 10 MG tablet TAKE 1 TABLET BY MOUTH EVERY DAY 01/26/14  Yes Alferd Apa Lowne, DO  nitroGLYCERIN (NITRODUR - DOSED IN MG/24 HR) 0.2 mg/hr patch Apply 1/4th patch over affected ankle, change daily 11/09/13  Yes Dene Gentry, MD  ondansetron (ZOFRAN) 8 MG tablet Take 1 tablet (8 mg total) by mouth every 8 (eight) hours as needed for nausea or vomiting. 07/08/13  Yes Yvonne R Lowne, DO  pantoprazole (PROTONIX) 40 MG tablet  Take 1 tablet (40 mg total) by mouth daily. 05/25/14  Yes Yvonne R Lowne, DO   BP 148/76 mmHg  Pulse 80  Temp(Src) 98.7 F (37.1 C) (Oral)  Resp 20  Ht 5\' 6"  (1.676 m)  Wt 332 lb (150.594 kg)  BMI 53.61 kg/m2  SpO2 100% Physical Exam  Constitutional: She appears distressed.  Uncomfortable appearing, obese  HENT:  Head: Normocephalic and atraumatic.  Right Ear: External ear normal.  Left Ear: External ear normal.  Eyes: Conjunctivae are normal. Right eye exhibits no discharge. Left eye exhibits no discharge. No scleral icterus.  Neck: Neck supple. No tracheal deviation present.  Cardiovascular: Normal rate, regular rhythm and intact distal pulses.   Pulmonary/Chest: Effort normal and breath sounds normal. No stridor. No respiratory distress. She has no wheezes. She has no rales.  Abdominal: Soft. Bowel sounds are normal. She exhibits no distension. There is generalized tenderness. There is no rigidity, no rebound and no guarding.  Musculoskeletal: She exhibits no edema or tenderness.  Neurological: She is alert. She has normal strength. No cranial nerve deficit (no facial droop, extraocular movements intact, no slurred speech) or sensory deficit. She exhibits normal muscle tone. She displays no seizure activity. Coordination normal.  Skin: Skin is warm and dry. No rash noted.  Psychiatric: She has a normal mood and affect.  Nursing note and vitals reviewed.   ED Course  Procedures (including critical care time) Labs Review Labs Reviewed  CBC WITH DIFFERENTIAL/PLATELET - Abnormal; Notable for the following:    Neutrophils Relative % 89 (*)    Lymphocytes Relative 9 (*)    Lymphs Abs 0.6 (*)    Monocytes Relative 2 (*)    All other components within normal limits  COMPREHENSIVE METABOLIC PANEL - Abnormal; Notable for the following:    Glucose, Bld 152 (*)    Calcium 8.0 (*)    Total Bilirubin 0.2 (*)    GFR calc non Af Amer 69 (*)    GFR calc Af Amer 80 (*)    All other  components within normal limits  CBG MONITORING, ED - Abnormal; Notable for the following:    Glucose-Capillary 130 (*)    All other components within normal limits  LIPASE, BLOOD  URINALYSIS, ROUTINE W REFLEX MICROSCOPIC   Medications  0.9 %  sodium chloride infusion (1,000 mLs Intravenous New Bag/Given 08/08/14 1325)    Followed by  0.9 %  sodium chloride infusion (not administered)  HYDROmorphone (DILAUDID) injection 1 mg (1 mg Intravenous Given 08/08/14 1327)  ondansetron (ZOFRAN) injection 4 mg (4 mg Intravenous Given 08/08/14 1325)    MDM   Final diagnoses:  Abdominal pain, acute    Labs  are unremarkable.  UA is still pending.  With her prior sugeries, plan on CT abd pelvis for further evaluation ie rule out bowel obstruction.    Dorie Rank, MD 08/08/14 [phone]

## 2014-08-09 ENCOUNTER — Emergency Department (HOSPITAL_BASED_OUTPATIENT_CLINIC_OR_DEPARTMENT_OTHER)
Admission: EM | Admit: 2014-08-09 | Discharge: 2014-08-09 | Disposition: A | Discharge status: Home / Self Care | Payer: 59 | Attending: Emergency Medicine | Admitting: Emergency Medicine

## 2014-08-09 ENCOUNTER — Encounter (HOSPITAL_BASED_OUTPATIENT_CLINIC_OR_DEPARTMENT_OTHER): Payer: Self-pay

## 2014-08-09 ENCOUNTER — Telehealth: Payer: Self-pay | Admitting: Family Medicine

## 2014-08-09 DIAGNOSIS — E119 Type 2 diabetes mellitus without complications: Secondary | ICD-10-CM | POA: Diagnosis not present

## 2014-08-09 DIAGNOSIS — I10 Essential (primary) hypertension: Secondary | ICD-10-CM | POA: Insufficient documentation

## 2014-08-09 DIAGNOSIS — K219 Gastro-esophageal reflux disease without esophagitis: Secondary | ICD-10-CM | POA: Diagnosis not present

## 2014-08-09 DIAGNOSIS — Z90710 Acquired absence of both cervix and uterus: Secondary | ICD-10-CM | POA: Insufficient documentation

## 2014-08-09 DIAGNOSIS — Z79899 Other long term (current) drug therapy: Secondary | ICD-10-CM | POA: Diagnosis not present

## 2014-08-09 DIAGNOSIS — Z7951 Long term (current) use of inhaled steroids: Secondary | ICD-10-CM | POA: Insufficient documentation

## 2014-08-09 DIAGNOSIS — Z794 Long term (current) use of insulin: Secondary | ICD-10-CM | POA: Insufficient documentation

## 2014-08-09 DIAGNOSIS — Z8701 Personal history of pneumonia (recurrent): Secondary | ICD-10-CM | POA: Insufficient documentation

## 2014-08-09 DIAGNOSIS — R109 Unspecified abdominal pain: Secondary | ICD-10-CM | POA: Insufficient documentation

## 2014-08-09 LAB — CBC WITH DIFFERENTIAL/PLATELET
Basophils Absolute: 0 10*3/uL (ref 0.0–0.1)
Basophils Relative: 0 % (ref 0–1)
EOS ABS: 0 10*3/uL (ref 0.0–0.7)
EOS PCT: 1 % (ref 0–5)
HCT: 38.6 % (ref 36.0–46.0)
Hemoglobin: 12.6 g/dL (ref 12.0–15.0)
LYMPHS ABS: 1 10*3/uL (ref 0.7–4.0)
Lymphocytes Relative: 16 % (ref 12–46)
MCH: 28.7 pg (ref 26.0–34.0)
MCHC: 32.6 g/dL (ref 30.0–36.0)
MCV: 87.9 fL (ref 78.0–100.0)
Monocytes Absolute: 0.4 10*3/uL (ref 0.1–1.0)
Monocytes Relative: 6 % (ref 3–12)
Neutro Abs: 4.7 10*3/uL (ref 1.7–7.7)
Neutrophils Relative %: 77 % (ref 43–77)
Platelets: 242 10*3/uL (ref 150–400)
RBC: 4.39 MIL/uL (ref 3.87–5.11)
RDW: 13.9 % (ref 11.5–15.5)
WBC: 6.2 10*3/uL (ref 4.0–10.5)

## 2014-08-09 LAB — BASIC METABOLIC PANEL
ANION GAP: 8 (ref 5–15)
BUN: 12 mg/dL (ref 6–23)
CALCIUM: 7.7 mg/dL — AB (ref 8.4–10.5)
CO2: 24 mmol/L (ref 19–32)
CREATININE: 0.89 mg/dL (ref 0.50–1.10)
Chloride: 107 mmol/L (ref 96–112)
GFR, EST AFRICAN AMERICAN: 84 mL/min — AB (ref >90)
GFR, EST NON AFRICAN AMERICAN: 73 mL/min — AB (ref >90)
Glucose, Bld: 125 mg/dL — ABNORMAL HIGH (ref 70–99)
Potassium: 3 mmol/L — ABNORMAL LOW (ref 3.5–5.1)
Sodium: 139 mmol/L (ref 135–145)

## 2014-08-09 LAB — I-STAT CG4 LACTIC ACID, ED: Lactic Acid, Venous: 0.67 mmol/L (ref 0.5–2.0)

## 2014-08-09 MED ORDER — ONDANSETRON HCL 4 MG/2ML IJ SOLN
INTRAMUSCULAR | Status: AC
  Filled 2014-08-09: qty 2

## 2014-08-09 MED ORDER — ONDANSETRON HCL 4 MG/2ML IJ SOLN
4.0000 mg | Freq: Once | INTRAMUSCULAR | Status: AC
  Administered 2014-08-09: 4 mg via INTRAVENOUS

## 2014-08-09 MED ORDER — PROMETHAZINE HCL 25 MG PO TABS: 25.0000 mg | ORAL_TABLET | Freq: Four times a day (QID) | ORAL | Status: DC | PRN

## 2014-08-09 MED ORDER — HYDROMORPHONE HCL 1 MG/ML IJ SOLN
1.0000 mg | Freq: Once | INTRAMUSCULAR | Status: AC
Start: 2014-08-09 — End: 2014-08-09
  Administered 2014-08-09: 1 mg via INTRAMUSCULAR
  Filled 2014-08-09: qty 1

## 2014-08-09 MED ORDER — HYDROMORPHONE HCL 1 MG/ML IJ SOLN
1.0000 mg | Freq: Once | INTRAMUSCULAR | Status: AC
  Administered 2014-08-09: 1 mg via INTRAMUSCULAR
  Filled 2014-08-09: qty 1

## 2014-08-09 MED ORDER — FENTANYL CITRATE 0.05 MG/ML IJ SOLN
100.0000 ug | Freq: Once | INTRAMUSCULAR | Status: AC
  Administered 2014-08-09: 100 ug via INTRAVENOUS

## 2014-08-09 MED ORDER — FENTANYL CITRATE 0.05 MG/ML IJ SOLN
INTRAMUSCULAR | Status: AC
  Filled 2014-08-09: qty 2

## 2014-08-09 MED ORDER — SODIUM CHLORIDE 0.9 % IV BOLUS (SEPSIS)
1000.0000 mL | Freq: Once | INTRAVENOUS | Status: AC
  Administered 2014-08-09: 1000 mL via INTRAVENOUS

## 2014-08-09 MED ORDER — HYDROCODONE-ACETAMINOPHEN 5-325 MG PO TABS: 1.0000 | ORAL_TABLET | ORAL | Status: DC | PRN

## 2014-08-09 NOTE — Telephone Encounter (Signed)
Caller name:Glazer, Ruthetta Relation to [id number] Call back [place]:  Reason for call: pt was seen in the ER lastnight for abdominal pain, states they informed her to contact her primary doctor to get a referral to a specialist, pt states she was sure what specialist it was but it is in her chart. Would like for dr. Etter Sjogren to review her chart and get the referral done for her.

## 2014-08-09 NOTE — Discharge Instructions (Signed)
Abdominal Pain, Women °Abdominal (stomach, pelvic, or belly) pain can be caused by many things. It is important to tell your doctor: °· The location of the pain. °· Does it come and go or is it present all the time? °· Are there things that start the pain (eating certain foods, exercise)? °· Are there other symptoms associated with the pain (fever, nausea, vomiting, diarrhea)? °All of this is helpful to know when trying to find the cause of the pain. °CAUSES  °· Stomach: virus or bacteria infection, or ulcer. °· Intestine: appendicitis (inflamed appendix), regional ileitis (Crohn's disease), ulcerative colitis (inflamed colon), irritable bowel syndrome, diverticulitis (inflamed diverticulum of the colon), or cancer of the stomach or intestine. °· Gallbladder disease or stones in the gallbladder. °· Kidney disease, kidney stones, or infection. °· Pancreas infection or cancer. °· Fibromyalgia (pain disorder). °· Diseases of the female organs: °¨ Uterus: fibroid (non-cancerous) tumors or infection. °¨ Fallopian tubes: infection or tubal pregnancy. °¨ Ovary: cysts or tumors. °¨ Pelvic adhesions (scar tissue). °¨ Endometriosis (uterus lining tissue growing in the pelvis and on the pelvic organs). °¨ Pelvic congestion syndrome (female organs filling up with blood just before the menstrual period). °¨ Pain with the menstrual period. °¨ Pain with ovulation (producing an egg). °¨ Pain with an IUD (intrauterine device, birth control) in the uterus. °¨ Cancer of the female organs. °· Functional pain (pain not caused by a disease, may improve without treatment). °· Psychological pain. °· Depression. °DIAGNOSIS  °Your doctor will decide the seriousness of your pain by doing an examination. °· Blood tests. °· X-rays. °· Ultrasound. °· CT scan (computed tomography, special type of X-ray). °· MRI (magnetic resonance imaging). °· Cultures, for infection. °· Barium enema (dye inserted in the large intestine, to better view it with  X-rays). °· Colonoscopy (looking in intestine with a lighted tube). °· Laparoscopy (minor surgery, looking in abdomen with a lighted tube). °· Major abdominal exploratory surgery (looking in abdomen with a large incision). °TREATMENT  °The treatment will depend on the cause of the pain.  °· Many cases can be observed and treated at home. °· Over-the-counter medicines recommended by your caregiver. °· Prescription medicine. °· Antibiotics, for infection. °· Birth control pills, for painful periods or for ovulation pain. °· Hormone treatment, for endometriosis. °· Nerve blocking injections. °· Physical therapy. °· Antidepressants. °· Counseling with a psychologist or psychiatrist. °· Minor or major surgery. °HOME CARE INSTRUCTIONS  °· Do not take laxatives, unless directed by your caregiver. °· Take over-the-counter pain medicine only if ordered by your caregiver. Do not take aspirin because it can cause an upset stomach or bleeding. °· Try a clear liquid diet (broth or water) as ordered by your caregiver. Slowly move to a bland diet, as tolerated, if the pain is related to the stomach or intestine. °· Have a thermometer and take your temperature several times a day, and record it. °· Bed rest and sleep, if it helps the pain. °· Avoid sexual intercourse, if it causes pain. °· Avoid stressful situations. °· Keep your follow-up appointments and tests, as your caregiver orders. °· If the pain does not go away with medicine or surgery, you may try: °¨ Acupuncture. °¨ Relaxation exercises (yoga, meditation). °¨ Group therapy. °¨ Counseling. °SEEK MEDICAL CARE IF:  °· You notice certain foods cause stomach pain. °· Your home care treatment is not helping your pain. °· You need stronger pain medicine. °· You want your IUD removed. °· You feel faint or   lightheaded.  You develop nausea and vomiting.  You develop a rash.  You are having side effects or an allergy to your medicine. SEEK IMMEDIATE MEDICAL CARE IF:   Your  pain does not go away or gets worse.  You have a fever.  Your pain is felt only in portions of the abdomen. The right side could possibly be appendicitis. The left lower portion of the abdomen could be colitis or diverticulitis.  You are passing blood in your stools (bright red or black tarry stools, with or without vomiting).  You have blood in your urine.  You develop chills, with or without a fever.  You pass out. MAKE SURE YOU:   Understand these instructions.  Will watch your condition.  Will get help right away if you are not doing well or get worse. Document Released: 02/16/2007 Document Revised: 09/05/2013 Document Reviewed: 03/08/2009 [facility] Patient Information 2015 [place], [place]. This information is not intended to replace advice given to you by your health care provider. Make sure you discuss any questions you have with your health care provider.  You were evaluated in the ED today for abdominal pain. There does not appear to be an emergent cause for your symptoms at this time. It is important for you to follow-up with her primary care for further evaluation and management of your symptoms. Return to ED for new or worsening symptoms.

## 2014-08-09 NOTE — ED Notes (Signed)
O2 SAO2 decreased to 86% on RA, O2 at [facility] reapplied

## 2014-08-09 NOTE — ED Notes (Signed)
Family at bedside. 

## 2014-08-09 NOTE — Telephone Encounter (Signed)
To MD to review and advise      KP 

## 2014-08-09 NOTE — ED Notes (Signed)
Pt tells me she was seen in this ED yesterday, had tests and sent home.  States pain was relieved while she was in the ED, however abdominal pain returned and she is unable to tolerate this pain.  States every time she tries to eat she will vomit.  Emesis x 2 in the past 24 hours.  Denies diarrhea or loose stool.

## 2014-08-09 NOTE — ED Notes (Signed)
Pt was seen yesterday for abd pain-states abd pain cont'd

## 2014-08-09 NOTE — ED Notes (Signed)
Attempted IV access in right AC unsuccessful.

## 2014-08-09 NOTE — ED Provider Notes (Signed)
CSN: [id number]     Arrival date & time 08/09/14  1105 History   First MD Initiated Contact with Patient 08/09/14 1200     Chief Complaint  Patient presents with  . Abdominal Pain     (Consider location/radiation/quality/duration/timing/severity/associated sxs/prior Treatment) HPI Tammy [name] is a 54 y.o. female History of GERD, hypertension, hyperlipidemia, type 2 diabetes, morbid obesity comes in for evaluation of abdominal pain. Patient reports symptoms began yesterday and she was seen in the ED yesterday with essentially benign workup and discharged home.patient had CT abdomen as well as pelvic ultrasound and both were essentially negative, however there was evidence of mild ascites. Patient reports symptoms began yesterday as sharp, diffuse pain that is rated as a 20/10. She reports feeling better in the ED yesterday following IV analgesia.she has an appointment with her primary care tomorrow but could not wait until then due to her abdominal pain. She reports associated nausea and vomiting when trying to eat, but denies fevers, diarrhea, constipation, chest pain, shortness of breath, dark or bloody stools, bloody emesis. No other aggravating or modifying factors.  Past Medical History  Diagnosis Date  . GERD (gastroesophageal reflux disease)   . Hypertension   . Hyperlipidemia   . Diabetes mellitus     TYPE 2  . Pneumonia   . Sinusitis   . Acute on chronic appendicitis s/p lap appy 06/10/2012 06/10/2012  . Morbid obesity - BMI > 50 12/13/2012   Past Surgical History  Procedure Laterality Date  . Abdominal hysterectomy    . Knee surgery Right      KNEE SURGERY   . Upper gastrointestinal endoscopy  04/14/2006    Normal  . Colonoscopy  04/11/11    5 mm polyp removed but not recovered  . Laparoscopic appendectomy  06/10/2012    Procedure: APPENDECTOMY LAPAROSCOPIC;  Surgeon: Adin Hector, MD;  Location: [facility];  Service: General;  Laterality: N/A;   Family History  Problem  Relation Age of Onset  . Hyperlipidemia Mother   . Hypertension Mother   . Cancer Mother 1    hodgkins lymphoma  . Diabetes Father   . Hyperlipidemia Brother   . Colon cancer Neg Hx   . Heart attack Neg Hx   . Sudden death Neg Hx   . Diabetes Paternal Grandmother    History  Substance Use Topics  . Smoking status: Never Smoker   . Smokeless tobacco: Never Used  . Alcohol Use: No   OB History    No data available     Review of Systems A 10 point review of systems was completed and was negative except for pertinent positives and negatives as mentioned in the history of present illness     Allergies  Cheese; Red dye; and Tuna  Home Medications   Prior to Admission medications   Medication Sig Start Date End Date Taking? Authorizing Provider  benazepril (LOTENSIN) 40 MG tablet Take 1 tablet (40 mg total) by mouth daily. 05/02/14   Rosalita Chessman, DO  Elastic Bandages & Supports (FUTURO SHEER SUPPORT HOSE) MISC 1 packet by Other route daily. 1 pair of support hose to wear daily 06/09/12   Rosalita Chessman, DO  fluticasone (FLONASE) 50 MCG/ACT nasal spray Place 2 sprays into both nostrils daily. 12/21/13   Meriam Sprague Saguier, PA-C  furosemide (LASIX) 40 MG tablet Take 1 tablet (40 mg total) by mouth daily. 10/13/13 02/09/15  Alferd Apa Lowne, DO  glucose blood test strip Check blood  sugar twice daily 05/25/14   Rosalita Chessman, DO  Insulin Pen Needle (NOVOFINE) 32G X 6 MM MISC Inject 1.8 mg into the skin daily. 10/13/13   Rosalita Chessman, DO  Liraglutide (VICTOZA) 18 MG/3ML SOPN Inject 0.3 mLs (1.8 mg total) into the skin daily. 06/19/14   Rosalita Chessman, DO  loratadine (CLARITIN) 10 MG tablet TAKE 1 TABLET BY MOUTH EVERY DAY 01/26/14   Rosalita Chessman, DO  nitroGLYCERIN (NITRODUR - DOSED IN MG/24 HR) 0.2 mg/hr patch Apply 1/4th patch over affected ankle, change daily 11/09/13   Dene Gentry, MD  ondansetron (ZOFRAN ODT) 4 MG disintegrating tablet 4mg  ODT q4 hours prn nausea/vomit 08/08/14    Malvin Johns, MD  ondansetron (ZOFRAN) 8 MG tablet Take 1 tablet (8 mg total) by mouth every 8 (eight) hours as needed for nausea or vomiting. 07/08/13   Rosalita Chessman, DO  pantoprazole (PROTONIX) 40 MG tablet Take 1 tablet (40 mg total) by mouth daily. 05/25/14   Yvonne R Lowne, DO   BP 169/77 mmHg  Pulse 73  Temp(Src) 98.5 F (36.9 C) (Oral)  Resp 20  Ht 5\' 6"  (1.676 m)  Wt 332 lb (150.594 kg)  BMI 53.61 kg/m2  SpO2 94% Physical Exam  Constitutional: She is oriented to person, place, and time. She appears well-developed and well-nourished.  Morbidly obese  HENT:  Head: Normocephalic and atraumatic.  Mouth/Throat: Oropharynx is clear and moist.  Eyes: Conjunctivae are normal. Pupils are equal, round, and reactive to light. Right eye exhibits no discharge. Left eye exhibits no discharge. No scleral icterus.  Neck: Neck supple.  Cardiovascular: Normal rate, regular rhythm and normal heart sounds.   Pulmonary/Chest: Effort normal and breath sounds normal. No respiratory distress. She has no wheezes. She has no rales.  Abdominal: Soft. There is no tenderness.  Exam somewhat limited by patient body habitus.Diffuse abdominal tenderness with guarding. No rebound or palpable masses. No obvious lesions or deformities. Abdomen is soft. No tympanic qualities, no distention.  Musculoskeletal: She exhibits no tenderness.  Neurological: She is alert and oriented to person, place, and time.  Cranial Nerves II-XII grossly intact  Skin: Skin is warm and dry. No rash noted.  Psychiatric: She has a normal mood and affect.  Nursing note and vitals reviewed.   ED Course  Procedures (including critical care time) Labs Review Labs Reviewed  BASIC METABOLIC PANEL - Abnormal; Notable for the following:    Potassium 3.0 (*)    Glucose, Bld 125 (*)    Calcium 7.7 (*)    GFR calc non Af Amer 73 (*)    GFR calc Af Amer 84 (*)    All other components within normal limits  CBC WITH DIFFERENTIAL/PLATELET   I-STAT CG4 LACTIC ACID, ED    Imaging Review US Transvaginal Non-ob  08/08/2014   CLINICAL DATA:  Abdominal pain, possible right ovarian enlargement on CT  EXAM: TRANSABDOMINAL AND TRANSVAGINAL ULTRASOUND OF PELVIS  TECHNIQUE: Both transabdominal and transvaginal ultrasound examinations of the pelvis were performed. Transabdominal technique was performed for global imaging of the pelvis including uterus, ovaries, adnexal regions, and pelvic cul-de-sac. It was necessary to proceed with endovaginal exam following the transabdominal exam to visualize the right adnexa.  COMPARISON:  CT abdomen pelvis dated 08/08/2014  FINDINGS: Uterus  Surgically absent.  Right ovary  Possible right ovary measures 3.3 x 2.3 x 1.7 cm.  Left ovary  Measurements: 3.6 x 1.8 x 3.6 cm. 1.7 cm simple cyst/follicle, physiologic.  Other findings  Large volume pelvic fluid/ascites.  IMPRESSION: Status post hysterectomy.  Possible right ovary is within normal limits.  Left ovary is within normal limits.  Large volume pelvic fluid/ascites.   Electronically Signed   By: Julian Hy M.D.   On: 08/08/2014 19:22   US Pelvis Complete  08/08/2014   CLINICAL DATA:  Abdominal pain, possible right ovarian enlargement on CT  EXAM: TRANSABDOMINAL AND TRANSVAGINAL ULTRASOUND OF PELVIS  TECHNIQUE: Both transabdominal and transvaginal ultrasound examinations of the pelvis were performed. Transabdominal technique was performed for global imaging of the pelvis including uterus, ovaries, adnexal regions, and pelvic cul-de-sac. It was necessary to proceed with endovaginal exam following the transabdominal exam to visualize the right adnexa.  COMPARISON:  CT abdomen pelvis dated 08/08/2014  FINDINGS: Uterus  Surgically absent.  Right ovary  Possible right ovary measures 3.3 x 2.3 x 1.7 cm.  Left ovary  Measurements: 3.6 x 1.8 x 3.6 cm. 1.7 cm simple cyst/follicle, physiologic.  Other findings  Large volume pelvic fluid/ascites.  IMPRESSION: Status post  hysterectomy.  Possible right ovary is within normal limits.  Left ovary is within normal limits.  Large volume pelvic fluid/ascites.   Electronically Signed   By: Julian Hy M.D.   On: 08/08/2014 19:22   Ct Abdomen Pelvis W Contrast  08/08/2014   CLINICAL DATA:  Generalized abdominal pain  EXAM: CT ABDOMEN AND PELVIS WITH CONTRAST  TECHNIQUE: Multidetector CT imaging of the abdomen and pelvis was performed using the standard protocol following bolus administration of intravenous contrast. Oral contrast was also administered.  CONTRAST:  68mL OMNIPAQUE IOHEXOL 300 MG/ML SOLN, 136mL OMNIPAQUE IOHEXOL 300 MG/ML SOLN  COMPARISON:  June 10, 2012  FINDINGS: There is patchy bibasilar lung atelectatic change. There is contrast in the distal esophagus, probably representing gastroesophageal reflux. There is a small hiatal hernia.  Liver is prominent, measuring 18.4 cm in length. The caudate lobe of the liver is rather prominent with relative hypoplasia of the left lobe of the liver. Question a degree of underlying cirrhosis. No focal liver lesions are identified. Gallbladder wall is not thickened. There is no appreciable biliary duct dilatation.  There are scattered small splenic granulomas. Spleen is normal in size and contour. No focal splenic lesions are identified.  Pancreas and adrenals appear stable and within normal limits. Kidneys bilaterally show no mass or hydronephrosis on either side. There is no renal or ureteral calculus on either side.  There is ascites in the pelvis extending into the upper abdomen, primarily surrounding the liver.  In the pelvis, urinary bladder is midline without wall thickening. There is no well-defined pelvic mass or pelvic fluid collection. Uterus is absent.  In the anterior lower pelvis, there is generalized soft tissue prominence which is difficult to discern from discrete loops of bowel. The possibility of ovarian enlargement in this area must be of concern, particularly  given the appearance on prior CT.  There is generalized mild small bowel dilatation. There is no transition zone. There is evidence of previous appendectomy with surgical clips in the right lower quadrant. No free air or portal venous air.  There is no abscess or adenopathy in the abdomen or pelvis. There is no abdominal aortic aneurysm. There are no blastic or lytic bone lesions. There is degenerative change in the lumbar spine.  IMPRESSION: Moderate ascites.  Soft tissue prominence anterior to the bladder in the lower pelvis. This area is difficult to delineate on this study. There is some overlying artifact due  to the patient's large size. The possibility of ovarian enlargement in this area cannot be excluded. Would advise pelvic ultrasound to better assess the ovaries given this circumstance.  The appearance of the liver raises question of a degree of underlying cirrhosis. The liver is prominent in size. No focal liver lesions are identified. Spleen is nonenlarged. No varices are appreciable.  Hiatal hernia with apparent gastroesophageal reflux.  Uterus and appendix absent.  Mild generalized small bowel dilatation without transition zone. Question a degree of ileus or possible enteritis.  No free air.  No apparent abscess.   Electronically Signed   By: Lowella Grip III M.D.   On: 08/08/2014 16:18     EKG Interpretation None     Meds given in ED:  Medications  fentaNYL (SUBLIMAZE) 0.05 MG/ML injection (not administered)  HYDROmorphone (DILAUDID) injection 1 mg (1 mg Intramuscular Given 08/09/14 1250)  sodium chloride 0.9 % bolus 1,000 mL (0 mLs Intravenous Stopped 08/09/14 1427)  ondansetron (ZOFRAN) injection 4 mg (4 mg Intravenous Given 08/09/14 1251)  fentaNYL (SUBLIMAZE) injection 100 mcg (100 mcg Intravenous Given 08/09/14 1429)    New Prescriptions   No medications on file   Filed Vitals:   08/09/14 1115 08/09/14 1306 08/09/14 1340 08/09/14 1431  BP: 178/73 135/44 173/87 169/77  Pulse:  91 72 60 73  Temp: 98.5 F (36.9 C)     TempSrc: Oral     Resp: 24 22 16 20   Height: 5\' 6"  (1.676 m)     Weight: 332 lb (150.594 kg)     SpO2: 100% 88% 99% 94%    MDM  Vitals stable, afebrile Feels better with analgesia administered in ED. Tolerating PO in ED Repeat abd exam benign, shows no focal tenderness. No Organomegaly. Moist mucous membranes Labs:  Noncontributory and essentially unchanged from previous. Lactic acid negative Imaging:CT abdomen and pelvic ultrasounds completed yesterday showed no evidence of acute or emergent pathology. Evidence of moderate ascites.  Status post appendectomy No evidence of mesenteric ischemia, SBP, Cholecystitis, Pancreatitis, Pyelonephritis, Cystitis, Diverticulitis, Acute infectious Colitis, SBO. No evidence of GI bleed. Doubt ectopic, torsion. Low suspicion for ischemia or other vascular compromise. Doubt Cardiovascular/Pulmonary etiology  I have personally reviewed all labs, imaging, nursing/previous notes during the patient's evaluation in the ED today. No evidence of other acute or emergent pathology that requires immediate intervention at this time. Pt stable, in good condition and is appropriate for discharge. DC with instructions to follow up with PCP within 48 hrs for further evaluation and management of symptoms as well as return precautions. Patient has been given referral to GI.  Final diagnoses:  Abdominal discomfort        Comer Locket, PA-C 08/09/14 [place], MD 08/10/14 [phone]

## 2014-08-10 ENCOUNTER — Telehealth: Payer: Self-pay | Admitting: Family Medicine

## 2014-08-10 ENCOUNTER — Ambulatory Visit (INDEPENDENT_AMBULATORY_CARE_PROVIDER_SITE_OTHER): Payer: 59 | Admitting: Family Medicine

## 2014-08-10 ENCOUNTER — Encounter: Payer: Self-pay | Admitting: Family Medicine

## 2014-08-10 VITALS — BP 122/90 | HR 72 | Temp 98.8°F | Wt 348.0 lb

## 2014-08-10 DIAGNOSIS — R110 Nausea: Secondary | ICD-10-CM

## 2014-08-10 DIAGNOSIS — R1010 Upper abdominal pain, unspecified: Secondary | ICD-10-CM

## 2014-08-10 DIAGNOSIS — E876 Hypokalemia: Secondary | ICD-10-CM | POA: Diagnosis not present

## 2014-08-10 DIAGNOSIS — R609 Edema, unspecified: Secondary | ICD-10-CM | POA: Diagnosis not present

## 2014-08-10 LAB — BASIC METABOLIC PANEL
BUN: 12 mg/dL (ref 6–23)
CALCIUM: 8.7 mg/dL (ref 8.4–10.5)
CO2: 24 mEq/L (ref 19–32)
Chloride: 106 mEq/L (ref 96–112)
Creatinine, Ser: 1.03 mg/dL (ref 0.40–1.20)
GFR: 71.95 mL/min (ref >60.00)
Glucose, Bld: 100 mg/dL — ABNORMAL HIGH (ref 70–99)
POTASSIUM: 3.4 meq/L — AB (ref 3.5–5.1)
Sodium: 139 mEq/L (ref 135–145)

## 2014-08-10 MED ORDER — HYDROCODONE-ACETAMINOPHEN 5-325 MG PO TABS: 1.0000 | ORAL_TABLET | ORAL | Status: DC | PRN

## 2014-08-10 MED ORDER — POTASSIUM CHLORIDE CRYS ER 10 MEQ PO TBCR: EXTENDED_RELEASE_TABLET | ORAL | Status: DC

## 2014-08-10 MED ORDER — FUTURO SHEER SUPPORT HOSE MISC: 1.0000 | Freq: Every day | Status: DC

## 2014-08-10 MED ORDER — GI COCKTAIL ~~LOC~~
30.0000 mL | Freq: Once | ORAL | Status: AC
  Administered 2014-08-10: 30 mL via ORAL

## 2014-08-10 NOTE — Telephone Encounter (Signed)
Caller name: [place] Relation to pt: self Call back number: [phone] Pharmacy:  Reason for call:   Patient states that she does not use [facility], she uses [facility]. Please resend prescriptions from today

## 2014-08-10 NOTE — Progress Notes (Signed)
Patient ID: Tammy [name], female    [name]: 04-Apr-1961  Age: 54 y.o. MRN: [id number]    Subjective:  Subjective HPIf/u Tammy [name] presents for f/u from /er for abd pain.  Er visit reviewed.    Review of Systems  Constitutional: Positive for fatigue. Negative for activity change, appetite change and unexpected weight change.  Respiratory: Negative for cough and shortness of breath.   Cardiovascular: Negative for chest pain and palpitations.  Gastrointestinal: Positive for nausea and abdominal pain. Negative for diarrhea and constipation.  Psychiatric/Behavioral: Negative for behavioral problems and dysphoric mood. The patient is not nervous/anxious.     History Past Medical History  Diagnosis Date  . GERD (gastroesophageal reflux disease)   . Hypertension   . Hyperlipidemia   . Diabetes mellitus     TYPE 2  . Pneumonia   . Sinusitis   . Acute on chronic appendicitis s/p lap appy 06/10/2012 06/10/2012  . Morbid obesity - BMI > 50 12/13/2012    She has past surgical history that includes Abdominal hysterectomy; Knee surgery (Right); Upper gastrointestinal endoscopy (04/14/2006); Colonoscopy (04/11/11); and laparoscopic appendectomy (06/10/2012).   Her family history includes Cancer (age of onset: 48) in her mother; Diabetes in her father and paternal grandmother; Hyperlipidemia in her brother and mother; Hypertension in her mother. There is no history of Colon cancer, Heart attack, or Sudden death.She reports that she has never smoked. She has never used smokeless tobacco. She reports that she does not drink alcohol or use illicit drugs.  Current Outpatient Prescriptions on File Prior to Visit  Medication Sig Dispense Refill  . benazepril (LOTENSIN) 40 MG tablet Take 1 tablet (40 mg total) by mouth daily. 90 tablet 0  . fluticasone (FLONASE) 50 MCG/ACT nasal spray Place 2 sprays into both nostrils daily. 16 g 3  . furosemide (LASIX) 40 MG tablet Take 1 tablet (40 mg total) by mouth  daily. 90 tablet 1  . glucose blood test strip Check blood sugar twice daily 100 each 11  . Insulin Pen Needle (NOVOFINE) 32G X 6 MM MISC Inject 1.8 mg into the skin daily. 100 each 3  . Liraglutide (VICTOZA) 18 MG/3ML SOPN Inject 0.3 mLs (1.8 mg total) into the skin daily. 27 pen 3  . loratadine (CLARITIN) 10 MG tablet TAKE 1 TABLET BY MOUTH EVERY DAY 30 tablet 11  . pantoprazole (PROTONIX) 40 MG tablet Take 1 tablet (40 mg total) by mouth daily. 30 tablet 3  . promethazine (PHENERGAN) 25 MG tablet Take 1 tablet (25 mg total) by mouth every 6 (six) hours as needed for nausea or vomiting. 10 tablet 0   No current facility-administered medications on file prior to visit.     Objective:  Objective Physical Exam  Constitutional: She is oriented to person, place, and time. She appears well-developed and well-nourished. No distress.  HENT:  Right Ear: External ear normal.  Left Ear: External ear normal.  Nose: Nose normal.  Mouth/Throat: Oropharynx is clear and moist.  Eyes: EOM are normal. Pupils are equal, round, and reactive to light.  Neck: Normal range of motion. Neck supple.  Cardiovascular: Normal rate, regular rhythm and normal heart sounds.   No murmur heard. Pulmonary/Chest: Effort normal and breath sounds normal. No respiratory distress. She has no wheezes. She has no rales. She exhibits no tenderness.  Abdominal: Soft. Bowel sounds are normal. She exhibits no distension and no mass. There is tenderness. There is guarding. There is no rebound.  Neurological: She is alert  and oriented to person, place, and time.  Psychiatric: She has a normal mood and affect. Her behavior is normal. Judgment and thought content normal.   BP 122/90 mmHg  Pulse 72  Temp(Src) 98.8 F (37.1 C) (Oral)  Wt 348 lb (157.852 kg)  SpO2 99% Wt Readings from Last 3 Encounters:  08/10/14 348 lb (157.852 kg)  08/09/14 332 lb (150.594 kg)  08/08/14 332 lb (150.594 kg)     Lab Results  Component Value  Date   WBC 6.2 08/09/2014   HGB 12.6 08/09/2014   HCT 38.6 08/09/2014   PLT 242 08/09/2014   GLUCOSE 100* 08/10/2014   CHOL 207* 05/02/2014   TRIG 57.0 05/02/2014   HDL 69.80 05/02/2014   LDLDIRECT 129.1 10/17/2009   LDLCALC 126* 05/02/2014   ALT 13 08/08/2014   AST 15 08/08/2014   NA 139 08/10/2014   K 3.4* 08/10/2014   CL 106 08/10/2014   CREATININE 1.03 08/10/2014   BUN 12 08/10/2014   CO2 24 08/10/2014   TSH 3.181 02/21/2014   HGBA1C 6.1 05/02/2014   MICROALBUR 0.58 10/22/2012    No results found.   Assessment & Plan:  Plan I have discontinued Ms. Chasen's ondansetron, nitroGLYCERIN, and ondansetron. I am also having her maintain her furosemide, Insulin Pen Needle, fluticasone, loratadine, benazepril, glucose blood, pantoprazole, Liraglutide, promethazine, HYDROcodone-acetaminophen, and FUTURO SHEER SUPPORT HOSE. We administered gi cocktail.  Meds ordered this encounter  Medications  . HYDROcodone-acetaminophen (NORCO) 5-325 MG per tablet    Sig: Take 1-2 tablets by mouth every 4 (four) hours as needed.    Dispense:  30 tablet    Refill:  0  . Elastic Bandages & Supports (FUTURO SHEER SUPPORT HOSE) MISC    Sig: 1 packet by Other route daily. 1 pair of support hose to wear daily    Dispense:  1 each    Refill:  0  . DISCONTD: potassium chloride (K-DUR,KLOR-CON) 10 MEQ tablet    Sig: 2 po qd    Dispense:  60 tablet    Refill:  1  . gi cocktail (Maalox,Lidocaine,Donnatal)    Sig:     Problem List Items Addressed This Visit    ANKLE EDEMA, CHRONIC   Relevant Medications   Elastic Bandages & Supports (FUTURO SHEER SUPPORT HOSE) MISC    Other Visit Diagnoses    Nausea    -  Primary    Relevant Orders    Ambulatory referral to Gastroenterology    Pain of upper abdomen        Relevant Medications    HYDROcodone-acetaminophen ([place]) 5-325 MG per tablet    gi cocktail (Maalox,Lidocaine,Donnatal) (Completed)    Hypokalemia        Relevant Orders    Basic  Metabolic Panel (BMET) (Completed)       Follow-up: Return if symptoms worsen or fail to improve.  Garnet Koyanagi, DO

## 2014-08-10 NOTE — Patient Instructions (Signed)

## 2014-08-10 NOTE — Telephone Encounter (Signed)
Patient called to update her pharmacy [facility] and potassium has been faxed to [facility] as requested.     KP

## 2014-08-10 NOTE — Telephone Encounter (Signed)
Pt seen today

## 2014-08-10 NOTE — Telephone Encounter (Signed)
Potassium refill needs to go to [facility] since that is a 30 day supply

## 2014-08-10 NOTE — Progress Notes (Signed)
Pre visit review using our clinic review tool, if applicable. No additional management support is needed unless otherwise documented below in the visit note. 

## 2014-08-11 NOTE — Addendum Note (Signed)
Addended by: Wilfrid Lund on: 08/11/2014 02:51 PM   Modules accepted: Orders

## 2014-08-15 ENCOUNTER — Telehealth: Payer: Self-pay | Admitting: Family Medicine

## 2014-08-15 DIAGNOSIS — E108 Type 1 diabetes mellitus with unspecified complications: Principal | ICD-10-CM

## 2014-08-15 DIAGNOSIS — E1065 Type 1 diabetes mellitus with hyperglycemia: Secondary | ICD-10-CM

## 2014-08-15 DIAGNOSIS — E119 Type 2 diabetes mellitus without complications: Secondary | ICD-10-CM

## 2014-08-15 DIAGNOSIS — IMO0002 Reserved for concepts with insufficient information to code with codable children: Secondary | ICD-10-CM

## 2014-08-15 NOTE — Telephone Encounter (Signed)
He acid reflux med is not working  She feels she needs to take it 2 times a day instaed of one time per day

## 2014-08-15 NOTE — Telephone Encounter (Signed)
Please advise if you want to change the directions.    KP

## 2014-08-15 NOTE — Telephone Encounter (Signed)
Detailed message left advising the patient it was ok to take 2.      KP

## 2014-08-15 NOTE — Telephone Encounter (Signed)
She can take it bid until she see s GI

## 2014-08-17 ENCOUNTER — Other Ambulatory Visit: Payer: Self-pay

## 2014-08-17 DIAGNOSIS — K219 Gastro-esophageal reflux disease without esophagitis: Secondary | ICD-10-CM

## 2014-08-17 DIAGNOSIS — J302 Other seasonal allergic rhinitis: Secondary | ICD-10-CM

## 2014-08-17 DIAGNOSIS — I10 Essential (primary) hypertension: Secondary | ICD-10-CM

## 2014-08-17 MED ORDER — LIRAGLUTIDE 18 MG/3ML ~~LOC~~ SOPN: 1.8000 mg | PEN_INJECTOR | Freq: Every day | SUBCUTANEOUS | Status: DC

## 2014-08-17 MED ORDER — BENAZEPRIL HCL 40 MG PO TABS: 40.0000 mg | ORAL_TABLET | Freq: Every day | ORAL | Status: DC

## 2014-08-17 MED ORDER — FLUTICASONE PROPIONATE 50 MCG/ACT NA SUSP: 2.0000 | Freq: Every day | NASAL | Status: DC

## 2014-08-17 MED ORDER — INSULIN PEN NEEDLE 32G X 6 MM MISC: 1.8000 mg | Freq: Every day | Status: DC

## 2014-08-17 MED ORDER — PANTOPRAZOLE SODIUM 40 MG PO TBEC: 80.0000 mg | DELAYED_RELEASE_TABLET | Freq: Every day | ORAL | Status: DC

## 2014-08-17 MED ORDER — LORATADINE 10 MG PO TABS: ORAL_TABLET | ORAL | Status: DC

## 2014-08-17 NOTE — Addendum Note (Signed)
Addended by: Ewing Schlein on: 08/17/2014 01:23 PM   Modules accepted: Orders

## 2014-08-17 NOTE — Telephone Encounter (Signed)
Patient is requesting refill on Victoza, Protonix and Benazepril sent to [facility]. Claritin and Flonase sent to [facility]. Med's have been sent.     KP

## 2014-08-21 ENCOUNTER — Encounter: Payer: 59 | Attending: Surgery | Admitting: Dietician

## 2014-08-21 DIAGNOSIS — Z6841 Body Mass Index (BMI) 40.0 and over, adult: Secondary | ICD-10-CM | POA: Insufficient documentation

## 2014-08-21 DIAGNOSIS — Z713 Dietary counseling and surveillance: Secondary | ICD-10-CM | POA: Diagnosis not present

## 2014-08-21 NOTE — Progress Notes (Signed)
  Medical Nutrition Therapy:  Appt start time: 245 end time:  300  Supervised Weight Loss:  Tammy [name] is here today for her 4th SWL visit out of 6 in preparation for gastric sleeve surgery having lost 1 pound since last month. Has been to the ED several times in the last month for abdominal pain, likely due to an enlarged ovary and "inflamed intestines." However, she has added a protein food to breakfast and is working on using healthy cooking methods for lean meats.  Handout provided: Bake, broil, grill   Plan:  -Practice chewing thoroughly  -Try steamer bags of veggies -Add protein to breakfast: boiled egg  Wt Readings from Last 3 Encounters:  08/21/14 342 lb 1.6 oz (155.176 kg)  08/10/14 348 lb (157.852 kg)  08/09/14 332 lb (150.594 kg)   Ht Readings from Last 3 Encounters:  08/09/14 5\' 6"  (1.676 m)  08/08/14 5\' 6"  (1.676 m)  03/20/14 5\' 6"  (1.676 m)   There is no weight on file to calculate BMI. @BMIFA @ Normalized weight-for-age data available only for age 74 to 51 years. Normalized stature-for-age data available only for age 74 to 49 years.   MEDICATIONS: see list  B: 3 peanut butter crackers with water Snk: banana and 3 peanut butter crackers L: grits, an egg, and toast Snk: [place], lettuce, and tomato sandwich with fruit Snk: yogurt with granola D: baked chicken with rice and green beans  Beverages: water throughout the day, fruit punch  Recent physical activity: treadmill 2x a week  Estimated energy needs: 1400-1600 calories  Progress Towards Goal(s):  In progress.   Nutritional Diagnosis:  [place]-3.3 Overweight/obesity related to past poor dietary habits and physical inactivity as evidenced by patient w/ pending bariatric surgery following dietary guidelines for continued weight loss.     Intervention:  Nutrition counseling provided.  Monitoring/Evaluation:  Dietary intake, exercise, and body weight in 4 week(s).

## 2014-08-21 NOTE — Patient Instructions (Signed)
-  Try steamer bags of veggies -Add protein to breakfast: boiled egg

## 2014-09-18 ENCOUNTER — Encounter: Payer: 59 | Attending: Surgery | Admitting: Dietician

## 2014-09-18 DIAGNOSIS — Z713 Dietary counseling and surveillance: Secondary | ICD-10-CM | POA: Diagnosis not present

## 2014-09-18 DIAGNOSIS — Z6841 Body Mass Index (BMI) 40.0 and over, adult: Secondary | ICD-10-CM | POA: Diagnosis not present

## 2014-09-18 NOTE — Patient Instructions (Addendum)
-  Try steamer bags of veggies -Try Powerade Zero and diet Cranberry juice  [email or web address] (fill out registration)

## 2014-09-18 NOTE — Progress Notes (Signed)
  Medical Nutrition Therapy:  Appt start time: 245 end time:  300  Supervised Weight Loss:  Tammy [name] is here today for her final SWL visit in preparation for gastric sleeve surgery having lost 8 pounds since last month despite not having taken her fluid pills consistently. Abdominal pain has resolved. She has noticed that any food/drink with artificial dye upsets her stomach. She is still practicing chewing thoroughly, feels like this helps her digestion. Tried adding boiled egg to breakfast and has tried protein shakes. Has a lot of family support and support at work.   Plan:  -Try steamer bags of veggies -Try Powerade Zero  Wt Readings from Last 3 Encounters:  09/18/14 334 lb 8 oz (151.728 kg)  08/21/14 342 lb 1.6 oz (155.176 kg)  08/10/14 348 lb (157.852 kg)   Ht Readings from Last 3 Encounters:  08/09/14 5\' 6"  (1.676 m)  08/08/14 5\' 6"  (1.676 m)  03/20/14 5\' 6"  (1.676 m)   There is no weight on file to calculate BMI. @BMIFA @ Normalized weight-for-age data available only for age 36 to 40 years. Normalized stature-for-age data available only for age 36 to 8 years.   MEDICATIONS: see list  B: 3 peanut butter crackers with water Snk: banana and 3 peanut butter crackers L: grits, an egg, and toast Snk: [place], lettuce, and tomato sandwich with fruit Snk: yogurt with granola D: baked chicken with rice and green beans  Beverages: water throughout the day, fruit punch  Recent physical activity: treadmill 2x a week  Estimated energy needs: 1400-1600 calories  Progress Towards Goal(s):  In progress.   Nutritional Diagnosis:  [place]-3.3 Overweight/obesity related to past poor dietary habits and physical inactivity as evidenced by patient w/ pending bariatric surgery following dietary guidelines for continued weight loss.     Intervention:  Nutrition counseling provided.  Monitoring/Evaluation:  Dietary intake, exercise, and body weight in 4 week(s).

## 2014-10-16 ENCOUNTER — Other Ambulatory Visit (INDEPENDENT_AMBULATORY_CARE_PROVIDER_SITE_OTHER): Payer: 59

## 2014-10-16 ENCOUNTER — Encounter: Payer: Self-pay | Admitting: Internal Medicine

## 2014-10-16 ENCOUNTER — Ambulatory Visit (INDEPENDENT_AMBULATORY_CARE_PROVIDER_SITE_OTHER): Payer: 59 | Admitting: Internal Medicine

## 2014-10-16 VITALS — BP 138/92 | HR 61 | Ht 66.0 in | Wt 341.4 lb

## 2014-10-16 DIAGNOSIS — R1031 Right lower quadrant pain: Secondary | ICD-10-CM | POA: Diagnosis not present

## 2014-10-16 DIAGNOSIS — R188 Other ascites: Secondary | ICD-10-CM

## 2014-10-16 DIAGNOSIS — R932 Abnormal findings on diagnostic imaging of liver and biliary tract: Secondary | ICD-10-CM

## 2014-10-16 DIAGNOSIS — R935 Abnormal findings on diagnostic imaging of other abdominal regions, including retroperitoneum: Secondary | ICD-10-CM

## 2014-10-16 LAB — PROTIME-INR
INR: 1.1 ratio — ABNORMAL HIGH (ref 0.8–1.0)
Prothrombin Time: 12.7 s (ref 9.6–13.1)

## 2014-10-16 NOTE — Patient Instructions (Signed)
  Your physician has requested that you go to the basement for the lab work before leaving today.   You have been set up for a paracentesis at [facility] for Friday June 17th at 10:00AM , arrive at 9:45AM. Please call [phone] if you need to change this date/time.   We  have set you up to see Dr. Donalynn Furlong on July 7th at 3:00pm, arrive at 2:45PM.  They are located at [address] [place] [address].  Phone [phone] , call if you need to change date/time.    I appreciate the opportunity to care for you. Silvano Rusk, M.D., [facility]

## 2014-10-16 NOTE — Progress Notes (Signed)
Subjective:    Patient ID: Tammy [name], female    [name]: [birth year], 54 y.o.   MRN: [id number] Cc: RLQ pain HPI In April she had terrible RLQ pain and ? Fever - went to ED where the evaluation suggested soft tissue prominence in area of right ovary and liver changes of caudate lobe prominence and hypoplasia of left lobe. Also had ascites. Pelvic US showed NL left overy with physiologic cyst and "possible right ovary" She has had a "partial hysterectomy" No GYN evaluation in about 5 years. No known hx liver disease or risk factors. Feeling ok now.  Wt Readings from Last 3 Encounters:  10/16/14 341 lb 6 oz (154.847 kg)  09/18/14 334 lb 8 oz (151.728 kg)  08/21/14 342 lb 1.6 oz (155.176 kg)   Allergies  Allergen Reactions  . Cheese   . Red Dye   . [name] Flash [Fish Allergy] Nausea And Vomiting   Outpatient Prescriptions Prior to Visit  Medication Sig Dispense Refill  . benazepril (LOTENSIN) 40 MG tablet Take 1 tablet (40 mg total) by mouth daily. 90 tablet 3  . Elastic Bandages & Supports (FUTURO SHEER SUPPORT HOSE) MISC 1 packet by Other route daily. 1 pair of support hose to wear daily 1 each 0  . fluticasone (FLONASE) 50 MCG/ACT nasal spray Place 2 sprays into both nostrils daily. 48 g 3  . furosemide (LASIX) 40 MG tablet Take 1 tablet (40 mg total) by mouth daily. 90 tablet 1  . glucose blood test strip Check blood sugar twice daily 100 each 11  . HYDROcodone-acetaminophen (NORCO) 5-325 MG per tablet Take 1-2 tablets by mouth every 4 (four) hours as needed. 30 tablet 0  . Insulin Pen Needle (NOVOFINE) 32G X 6 MM MISC Inject 1.8 mg into the skin daily. 100 each 3  . Liraglutide (VICTOZA) 18 MG/3ML SOPN Inject 0.3 mLs (1.8 mg total) into the skin daily. 27 pen 3  . loratadine (CLARITIN) 10 MG tablet TAKE 1 TABLET BY MOUTH EVERY DAY 90 tablet 3  . pantoprazole (PROTONIX) 40 MG tablet Take 2 tablets (80 mg total) by mouth daily. 180 tablet 3  . potassium chloride (K-DUR,KLOR-CON) 10 MEQ  tablet 2 po qd 60 tablet 5  . promethazine (PHENERGAN) 25 MG tablet Take 1 tablet (25 mg total) by mouth every 6 (six) hours as needed for nausea or vomiting. 10 tablet 0   No facility-administered medications prior to visit.   Past Medical History  Diagnosis Date  . GERD (gastroesophageal reflux disease)   . Hypertension   . Hyperlipidemia   . Diabetes mellitus     TYPE 2  . Pneumonia   . Sinusitis   . Acute on chronic appendicitis s/p lap appy 06/10/2012 06/10/2012  . Morbid obesity - BMI > 50 12/13/2012   Past Surgical History  Procedure Laterality Date  . Abdominal hysterectomy    . Knee surgery Right      KNEE SURGERY   . Upper gastrointestinal endoscopy  04/14/2006    Normal  . Colonoscopy  04/11/11    5 mm polyp removed but not recovered  . Laparoscopic appendectomy  06/10/2012    Procedure: APPENDECTOMY LAPAROSCOPIC;  Surgeon: Adin Hector, MD;  Location: [facility];  Service: General;  Laterality: N/A;   History   Social History  . Marital Status: Married    Spouse Name: N/A  . Number of Children: 0  . Years of Education: N/A   Occupational History  . [employer] technician [employer]   Social History Main Topics  . Smoking status: Never Smoker   . Smokeless tobacco: Never Used  . Alcohol Use: No  . Drug Use: No  . Sexual Activity:    Partners: Male   Other Topics Concern  . None   Social History Narrative   Family History  Problem Relation Age of Onset  . Hyperlipidemia Mother   . Hypertension Mother   . Cancer Mother 37    hodgkins lymphoma  . Diabetes Father   . Hyperlipidemia Brother   . Colon cancer Neg Hx   . Heart attack Neg Hx   . Sudden death Neg Hx   . Diabetes Paternal Grandmother       Review of Systems As above    Objective:   Physical Exam @BP  138/92 mmHg  Pulse 61  Ht 5\' 6"  (1.676 m)  Wt 341 lb 6 oz (154.847 kg)  BMI 55.13 kg/m2@  General:  NAD, obese Eyes:   anicteric Lungs:  clear Heart:: S1S2 no rubs, murmurs or  gallops Abdomen:  soft and nontender, BS+ Ext:   1+ LE bilat non-pitting edema, cyanosis or clubbing  Data Reviewed:  ED notes, labs, CT images, [name] reports April 2016     Assessment & Plan:  Ascites - Plan: Hepatitis B core antibody, total, Hepatitis C antibody, Hepatitis B surface antigen, Protime-INR, US Paracentesis, US Paracentesis, Body fluid culture with gram stain, Body fluid cell count with differential, Albumin, Peritoneal fluid, Protein, Peritoneal fluid  Abnormal liver CT - Plan: Hepatitis B core antibody, total, Hepatitis C antibody, Hepatitis B surface antigen, Protime-INR, US Paracentesis, Body fluid culture with gram stain, Body fluid cell count with differential, Albumin, Peritoneal fluid, Protein, Peritoneal fluid  RLQ abdominal pain - Plan: Hepatitis B core antibody, total, Hepatitis C antibody, Hepatitis B surface antigen, Protime-INR, Ambulatory referral to Gynecology, US Paracentesis, Body fluid culture with gram stain, Body fluid cell count with differential, Albumin, Peritoneal fluid, Protein, Peritoneal fluid  Abnormal ultrasound of ovary - Plan: Ambulatory referral to Gynecology  I am uncertain as to what has happened or is here.  ? Cirrhosis - PLT's ok but ascites and CT changes raise ? - so paracentesis, HCV and HBV screen - these are negative  ? Ovarian issue - GYN evaluation  One other possibility could be ACEI angioedema   Cc: Uvaldo Rising, MD

## 2014-10-17 LAB — HEPATITIS C ANTIBODY: HCV AB: NEGATIVE

## 2014-10-17 LAB — HEPATITIS B SURFACE ANTIGEN: Hepatitis B Surface Ag: NEGATIVE

## 2014-10-17 LAB — HEPATITIS B CORE ANTIBODY, TOTAL: HEP B C TOTAL AB: NONREACTIVE

## 2014-10-17 NOTE — Progress Notes (Signed)
Quick Note:  No hepatitis B or C and liver working fine ______

## 2014-10-18 ENCOUNTER — Encounter: Payer: Self-pay | Admitting: Internal Medicine

## 2014-10-20 ENCOUNTER — Ambulatory Visit (HOSPITAL_COMMUNITY): Payer: 59

## 2014-10-30 ENCOUNTER — Other Ambulatory Visit: Payer: Self-pay

## 2014-10-31 ENCOUNTER — Ambulatory Visit (HOSPITAL_COMMUNITY)
Admission: RE | Admit: 2014-10-31 | Discharge: 2014-10-31 | Disposition: A | Discharge status: Home / Self Care | Payer: 59 | Source: Ambulatory Visit | Attending: Internal Medicine | Admitting: Internal Medicine

## 2014-10-31 ENCOUNTER — Other Ambulatory Visit: Payer: Self-pay | Admitting: Internal Medicine

## 2014-10-31 DIAGNOSIS — R188 Other ascites: Secondary | ICD-10-CM

## 2014-10-31 NOTE — Progress Notes (Signed)
Patient ID: Tammy [name], female   [name]: 01-25-1961, 54 y.o.   MRN: [id number] Patient presented to ultrasound department today for paracentesis. On limited ultrasound of abdomen in all 4 quadrants there is no significant ascites present. Procedure was canceled. Patient was notified.

## 2014-11-01 NOTE — Progress Notes (Signed)
Quick Note:  Neg for ascites Await GYN eval 7/20 ______

## 2014-11-09 ENCOUNTER — Ambulatory Visit: Payer: Self-pay | Admitting: Gynecology

## 2014-11-22 ENCOUNTER — Ambulatory Visit (INDEPENDENT_AMBULATORY_CARE_PROVIDER_SITE_OTHER): Payer: 59 | Admitting: Gynecology

## 2014-11-22 ENCOUNTER — Encounter: Payer: Self-pay | Admitting: Gynecology

## 2014-11-22 VITALS — BP 136/90 | Ht 65.0 in | Wt 342.0 lb

## 2014-11-22 DIAGNOSIS — E669 Obesity, unspecified: Secondary | ICD-10-CM

## 2014-11-22 DIAGNOSIS — N8320 Unspecified ovarian cysts: Secondary | ICD-10-CM | POA: Diagnosis not present

## 2014-11-22 DIAGNOSIS — N83202 Unspecified ovarian cyst, left side: Secondary | ICD-10-CM

## 2014-11-22 DIAGNOSIS — R188 Other ascites: Secondary | ICD-10-CM

## 2014-11-22 NOTE — Progress Notes (Signed)
   Patient is a 54 year old gravida 1 para 0 AB 1 (spontaneous miscarriage first trimester) who was referred to our practice as a courtesy of Dr. Silvano Rusk her gastroenterologist who had been evaluating her for right lower abdominal pains since April of this year. There had been reported of her having abdominal and pelvic ascites. Patient had stated that over 10 years ago she had an abdominal hysterectomy for symptomatic leiomyomatous uteri and menorrhagia. She had not seen a gynecologist in many years. Patient was asymptomatic today. See previous note from Dr. Carlean Purl for additional details has to his current working diagnosis and evaluation. I have reviewed her ultrasound and CT scan since 2014 as follows:   In June 2014 patient had a CT of the abdomen and pelvis and there was a questionable left adnexal mass the bladder for which they described a measurement of being 4.4 x 4.0 cm no pelvic fluid was note. A dilated appendix was reported.  Ultrasound 2014 shortly thereafter demonstrated left ovary follicle measuring 1.7 x 2.7 x 2.0 cm with a second small lesion measuring 2.0 x 1.8 x 2.4 cm questionable endometrioma versus hemorrhagic cyst. Of note patient had an appendectomy shortly thereafter.  April 2016 because of patient's abdominal pains had a CT of the abdomen and pelvis and there was documentation of moderate ascites and tissue prominence anterior to the bladder but could not delineate an ovarian mass.  Ultrasound of pelvis April 0881 simple left follicle cyst measuring 1.7 cm and large amount of pelvic fluid and ascites was described.  June 2016 an abdominal CT had been ordered by her gastroenterologist once again there was no ascitic fluid but no pelvic images reported.  Patient reports no prior history of any abnormal Pap smear and reports a colonoscopy less than 5 years ago reportedly normal.  Exam: Blood pressure 136/90   weight 342 pounds   5 feet 5 inches tall   BMI 56.91  kg/m Gen. appearance morbidly obese female in no acute distress Back: No CVA tenderness Abdomen pendulous nontender no rebound or guarding Abdominal scar noted Pelvic: Bartholin urethra Skene was within normal limits Vagina: No lesions or discharge vaginal cuff intact bimanual exam although limited did not elicit any pain or tenderness and no large masses palpated although limited because of patient's abdominal girth. Rectovaginal exam unremarkable  Assessment/plan: Morbidly obese female with prior history of abdominal hysterectomy for leiomyomatous uteri secondary to menorrhagia and fibroids has been followed for several months by the gastroenterologist because of abdominal pains and review of ultrasound and CT scan over the course of past year she's had fluctuating ascitic fluid in her abdomen and pelvis. Patient will return back to the office for pelvic ultrasound to compare with previous study done at the hospital on April 2016 for further assessment.

## 2014-11-28 ENCOUNTER — Encounter: Payer: Self-pay | Admitting: Internal Medicine

## 2014-11-28 ENCOUNTER — Telehealth: Payer: Self-pay | Admitting: Family Medicine

## 2014-11-28 NOTE — Telephone Encounter (Signed)
Spoke with Tammy [name] from [employer] and he has Victo[employer] coupons, he will drop them off tomorrow. I made the patient aware and she verbalized understanding.    KP

## 2014-11-28 NOTE — Telephone Encounter (Signed)
Relation to pt: self  Call back number: [phone]   Reason for call:  Patient states Liraglutide ([place]) 18 MG/3ML SOPN is to expensive in need of an alternate medication

## 2014-11-29 NOTE — Telephone Encounter (Signed)
Detailed message left advising discount card left at check in.      KP

## 2015-01-03 ENCOUNTER — Encounter: Payer: Self-pay | Admitting: Gynecology

## 2015-01-03 ENCOUNTER — Ambulatory Visit (INDEPENDENT_AMBULATORY_CARE_PROVIDER_SITE_OTHER): Payer: 59

## 2015-01-03 ENCOUNTER — Other Ambulatory Visit: Payer: Self-pay | Admitting: Gynecology

## 2015-01-03 ENCOUNTER — Ambulatory Visit (INDEPENDENT_AMBULATORY_CARE_PROVIDER_SITE_OTHER): Payer: 59 | Admitting: Gynecology

## 2015-01-03 VITALS — BP 134/86

## 2015-01-03 DIAGNOSIS — N83202 Unspecified ovarian cyst, left side: Secondary | ICD-10-CM

## 2015-01-03 DIAGNOSIS — N8320 Unspecified ovarian cysts: Secondary | ICD-10-CM | POA: Diagnosis not present

## 2015-01-03 DIAGNOSIS — E669 Obesity, unspecified: Secondary | ICD-10-CM

## 2015-01-03 DIAGNOSIS — R188 Other ascites: Secondary | ICD-10-CM

## 2015-01-03 NOTE — Patient Instructions (Signed)
CA-125 Tumor Marker CA 125 is a tumor marker that is used to help monitor the course of ovarian or endometrial cancer. PREPARATION FOR TEST No preparation is necessary. NORMAL FINDINGS Adults: 0-35 units/mL (0-35 kilounits)/L Ranges for normal findings may vary among different laboratories and hospitals. You should always check with your doctor after having lab work or other tests done to discuss the meaning of your test results and whether your values are considered within normal limits. MEANING OF TEST  Your caregiver will go over the test results with you and discuss the importance and meaning of your results, as well as treatment options and the need for additional tests if necessary. OBTAINING THE TEST RESULTS It is your responsibility to obtain your test results. Ask the lab or department performing the test when and how you will get your results. Document Released: 05/13/2004 Document Revised: 07/14/2011 Document Reviewed: 03/29/2008 ExitCare Patient Information 2015 [employer]. This information is not intended to replace advice given to you by your health care provider. Make sure you discuss any questions you have with your health care provider. Ovarian Cyst An ovarian cyst is a fluid-filled sac that forms on an ovary. The ovaries are small organs that produce eggs in women. Various types of cysts can form on the ovaries. Most are not cancerous. Many do not cause problems, and they often go away on their own. Some may cause symptoms and require treatment. Common types of ovarian cysts include:  Functional cysts--These cysts may occur every month during the menstrual cycle. This is normal. The cysts usually go away with the next menstrual cycle if the woman does not get pregnant. Usually, there are no symptoms with a functional cyst.  Endometrioma cysts--These cysts form from the tissue that lines the uterus. They are also called "chocolate cysts" because they become filled with blood  that turns brown. This type of cyst can cause pain in the lower abdomen during intercourse and with your menstrual period.  Cystadenoma cysts--This type develops from the cells on the outside of the ovary. These cysts can get very big and cause lower abdomen pain and pain with intercourse. This type of cyst can twist on itself, cut off its blood supply, and cause severe pain. It can also easily rupture and cause a lot of pain.  Dermoid cysts--This type of cyst is sometimes found in both ovaries. These cysts may contain different kinds of body tissue, such as skin, teeth, hair, or cartilage. They usually do not cause symptoms unless they get very big.  Theca lutein cysts--These cysts occur when too much of a certain hormone (human chorionic gonadotropin) is produced and overstimulates the ovaries to produce an egg. This is most common after procedures used to assist with the conception of a baby (in vitro fertilization). CAUSES   Fertility drugs can cause a condition in which multiple large cysts are formed on the ovaries. This is called ovarian hyperstimulation syndrome.  A condition called polycystic ovary syndrome can cause hormonal imbalances that can lead to nonfunctional ovarian cysts. SIGNS AND SYMPTOMS  Many ovarian cysts do not cause symptoms. If symptoms are present, they may include:  Pelvic pain or pressure.  Pain in the lower abdomen.  Pain during sexual intercourse.  Increasing girth (swelling) of the abdomen.  Abnormal menstrual periods.  Increasing pain with menstrual periods.  Stopping having menstrual periods without being pregnant. DIAGNOSIS  These cysts are commonly found during a routine or annual pelvic exam. Tests may be ordered to find   out more about the cyst. These tests may include:  Ultrasound.  X-ray of the pelvis.  CT scan.  MRI.  Blood tests. TREATMENT  Many ovarian cysts go away on their own without treatment. Your health care provider may want  to check your cyst regularly for 2-3 months to see if it changes. For women in menopause, it is particularly important to monitor a cyst closely because of the higher rate of ovarian cancer in menopausal women. When treatment is needed, it may include any of the following:  A procedure to drain the cyst (aspiration). This may be done using a long needle and ultrasound. It can also be done through a laparoscopic procedure. This involves using a thin, lighted tube with a tiny camera on the end (laparoscope) inserted through a small incision.  Surgery to remove the whole cyst. This may be done using laparoscopic surgery or an open surgery involving a larger incision in the lower abdomen.  Hormone treatment or birth control pills. These methods are sometimes used to help dissolve a cyst. HOME CARE INSTRUCTIONS   Only take over-the-counter or prescription medicines as directed by your health care provider.  Follow up with your health care provider as directed.  Get regular pelvic exams and Pap tests. SEEK MEDICAL CARE IF:   Your periods are late, irregular, or painful, or they stop.  Your pelvic pain or abdominal pain does not go away.  Your abdomen becomes larger or swollen.  You have pressure on your bladder or trouble emptying your bladder completely.  You have pain during sexual intercourse.  You have feelings of fullness, pressure, or discomfort in your stomach.  You lose weight for no apparent reason.  You feel generally ill.  You become constipated.  You lose your appetite.  You develop acne.  You have an increase in body and facial hair.  You are gaining weight, without changing your exercise and eating habits.  You think you are pregnant. SEEK IMMEDIATE MEDICAL CARE IF:   You have increasing abdominal pain.  You feel sick to your stomach (nauseous), and you throw up (vomit).  You develop a fever that comes on suddenly.  You have abdominal pain during a bowel  movement.  Your menstrual periods become heavier than usual. MAKE SURE YOU:  Understand these instructions.  Will watch your condition.  Will get help right away if you are not doing well or get worse. Document Released: 04/21/2005 Document Revised: 04/26/2013 Document Reviewed: 12/27/2012 ExitCare Patient Information 2015 ExitCare[employer]. This information is not intended to replace advice given to you by your health care provider. Make sure you discuss any questions you have with your health care provider.  

## 2015-01-03 NOTE — Progress Notes (Addendum)
   Patient is a 54 year old presented to the office today to discuss her ultrasound. She was seen in the office on 11/22/2014 as a referral from her gastroenterologist. No from that encounter as follows:  "54 year old gravida 1 para 0 AB 1 (spontaneous miscarriage first trimester) who was referred to our practice as a courtesy of Dr. Silvano Rusk her gastroenterologist who had been evaluating her for right lower abdominal pains since April of this year. There had been reported of her having abdominal and pelvic ascites. Patient had stated that over 10 years ago she had an abdominal hysterectomy for symptomatic leiomyomatous uteri and menorrhagia. She had not seen a gynecologist in many years. Patient was asymptomatic today. See previous note from Dr. Carlean Purl for additional details has to his current working diagnosis and evaluation. I have reviewed her ultrasound and CT scan since 2014 as follows:   In June 2014 patient had a CT of the abdomen and pelvis and there was a questionable left adnexal mass the bladder for which they described a measurement of being 4.4 x 4.0 cm no pelvic fluid was note. A dilated appendix was reported.  Ultrasound 2014 shortly thereafter demonstrated left ovary follicle measuring 1.7 x 2.7 x 2.0 cm with a second small lesion measuring 2.0 x 1.8 x 2.4 cm questionable endometrioma versus hemorrhagic cyst. Of note patient had an appendectomy shortly thereafter.  April 2016 because of patient's abdominal pains had a CT of the abdomen and pelvis and there was documentation of moderate ascites and tissue prominence anterior to the bladder but could not delineate an ovarian mass.  Ultrasound of pelvis April 8676 simple left follicle cyst measuring 1.7 cm and large amount of pelvic fluid and ascites was described.  June 2016 an abdominal CT had been ordered by her gastroenterologist once again there was no ascitic fluid but no pelvic images reported.  Patient reports no prior  history of any abnormal Pap smear and reports a colonoscopy less than 5 years ago reportedly normal."  Patient is asymptomatic. She has informing that she's in the process of being scheduled for bariatric surgery because of her morbid obesity.  Ultrasound today: Uterus absent. Vaginal cuff normal. Right ovary normal. Left ovary 2 thinwall echo-free avascular cyst one measuring 16 x 14 mm second 1 24 x 29 x 23 mm. There was some fluid noted in the cul-de-sac previous fluid at the right adnexal region not seen from prior ultrasound exam.   Assessment/plan: Postmenopausal patient with history of ovarian cyst benign appearance of 2 small cyst noted on the left ovary possibly collapsing would explain some fluid in the cul-de-sac. We will check an ovarian cancer screening blood tests such as a ROMA-1. Is normal we will follow-up with an ultrasound here in the office in 3 months. Literature information provided.

## 2015-01-11 LAB — OVARIAN MALIGNANCY RISK-ROMA
CA125: 9 U/mL (ref <35)
HE4: 42 pM (ref <151)
ROMA Postmenopausal: 0.69 (ref <2.77)
ROMA Premenopausal: 0.49 (ref <1.31)

## 2015-01-18 ENCOUNTER — Telehealth: Payer: Self-pay | Admitting: Family Medicine

## 2015-01-18 DIAGNOSIS — E108 Type 1 diabetes mellitus with unspecified complications: Principal | ICD-10-CM

## 2015-01-18 DIAGNOSIS — IMO0002 Reserved for concepts with insufficient information to code with codable children: Secondary | ICD-10-CM

## 2015-01-18 DIAGNOSIS — E1065 Type 1 diabetes mellitus with hyperglycemia: Secondary | ICD-10-CM

## 2015-01-18 NOTE — Telephone Encounter (Signed)
Caller name: Tammy [name]  Relationship to patient: Self  Can be reached: [phone] Pharmacy: [id number] - [place], [place] - [address] AT [place]  Reason for call: pt says that she would like to have her Optima X  Rx sent to [facility] instead. ALSO she says that she she has labs that are still showing via Mychart to be completed. She want to know if that is accurate because she completed labs on her last visit.    Pt request call back to advise.

## 2015-01-19 NOTE — Telephone Encounter (Signed)
Msg left to call the office     KP 

## 2015-01-19 NOTE — Telephone Encounter (Signed)
Patient returning your call.

## 2015-01-22 ENCOUNTER — Emergency Department (HOSPITAL_COMMUNITY): Payer: 59

## 2015-01-22 ENCOUNTER — Emergency Department (HOSPITAL_COMMUNITY)
Admission: EM | Admit: 2015-01-22 | Discharge: 2015-01-23 | Disposition: A | Discharge status: Home / Self Care | Payer: 59 | Source: Home / Self Care | Attending: Emergency Medicine | Admitting: Emergency Medicine

## 2015-01-22 ENCOUNTER — Encounter: Payer: Self-pay | Admitting: Family

## 2015-01-22 ENCOUNTER — Ambulatory Visit (INDEPENDENT_AMBULATORY_CARE_PROVIDER_SITE_OTHER): Payer: 59 | Admitting: Family

## 2015-01-22 ENCOUNTER — Ambulatory Visit: Payer: 59 | Admitting: Medical

## 2015-01-22 ENCOUNTER — Encounter (HOSPITAL_COMMUNITY): Payer: Self-pay | Admitting: Emergency Medicine

## 2015-01-22 VITALS — BP 170/90 | HR 70 | Temp 98.4°F | Resp 24 | Wt 338.4 lb

## 2015-01-22 DIAGNOSIS — Z8701 Personal history of pneumonia (recurrent): Secondary | ICD-10-CM | POA: Insufficient documentation

## 2015-01-22 DIAGNOSIS — Z90710 Acquired absence of both cervix and uterus: Secondary | ICD-10-CM | POA: Insufficient documentation

## 2015-01-22 DIAGNOSIS — Z8709 Personal history of other diseases of the respiratory system: Secondary | ICD-10-CM | POA: Insufficient documentation

## 2015-01-22 DIAGNOSIS — R1033 Periumbilical pain: Secondary | ICD-10-CM

## 2015-01-22 DIAGNOSIS — Z794 Long term (current) use of insulin: Secondary | ICD-10-CM | POA: Insufficient documentation

## 2015-01-22 DIAGNOSIS — N8320 Unspecified ovarian cysts: Secondary | ICD-10-CM

## 2015-01-22 DIAGNOSIS — R197 Diarrhea, unspecified: Secondary | ICD-10-CM

## 2015-01-22 DIAGNOSIS — R1010 Upper abdominal pain, unspecified: Secondary | ICD-10-CM

## 2015-01-22 DIAGNOSIS — K219 Gastro-esophageal reflux disease without esophagitis: Secondary | ICD-10-CM

## 2015-01-22 DIAGNOSIS — I10 Essential (primary) hypertension: Secondary | ICD-10-CM

## 2015-01-22 DIAGNOSIS — K559 Vascular disorder of intestine, unspecified: Secondary | ICD-10-CM | POA: Diagnosis not present

## 2015-01-22 DIAGNOSIS — Z79899 Other long term (current) drug therapy: Secondary | ICD-10-CM | POA: Insufficient documentation

## 2015-01-22 DIAGNOSIS — E119 Type 2 diabetes mellitus without complications: Secondary | ICD-10-CM | POA: Insufficient documentation

## 2015-01-22 DIAGNOSIS — Z7951 Long term (current) use of inhaled steroids: Secondary | ICD-10-CM | POA: Insufficient documentation

## 2015-01-22 DIAGNOSIS — R109 Unspecified abdominal pain: Secondary | ICD-10-CM | POA: Diagnosis not present

## 2015-01-22 DIAGNOSIS — N83202 Unspecified ovarian cyst, left side: Secondary | ICD-10-CM

## 2015-01-22 DIAGNOSIS — R1030 Lower abdominal pain, unspecified: Secondary | ICD-10-CM

## 2015-01-22 DIAGNOSIS — R112 Nausea with vomiting, unspecified: Secondary | ICD-10-CM

## 2015-01-22 LAB — URINALYSIS, ROUTINE W REFLEX MICROSCOPIC
Bilirubin Urine: NEGATIVE
GLUCOSE, UA: NEGATIVE mg/dL
Hgb urine dipstick: NEGATIVE
KETONES UR: 40 mg/dL — AB
LEUKOCYTES UA: NEGATIVE
Nitrite: NEGATIVE
PROTEIN: NEGATIVE mg/dL
Specific Gravity, Urine: 1.027 (ref 1.005–1.030)
UROBILINOGEN UA: 0.2 mg/dL (ref 0.0–1.0)
pH: 6 (ref 5.0–8.0)

## 2015-01-22 LAB — COMPREHENSIVE METABOLIC PANEL
ALK PHOS: 66 U/L (ref 38–126)
ALT: 18 U/L (ref 14–54)
ANION GAP: 11 (ref 5–15)
AST: 21 U/L (ref 15–41)
Albumin: 4.1 g/dL (ref 3.5–5.0)
BILIRUBIN TOTAL: 0.9 mg/dL (ref 0.3–1.2)
BUN: 11 mg/dL (ref 6–20)
CALCIUM: 8.4 mg/dL — AB (ref 8.9–10.3)
CO2: 21 mmol/L — ABNORMAL LOW (ref 22–32)
CREATININE: 0.85 mg/dL (ref 0.44–1.00)
Chloride: 105 mmol/L (ref 101–111)
Glucose, Bld: 119 mg/dL — ABNORMAL HIGH (ref 65–99)
Potassium: 3.9 mmol/L (ref 3.5–5.1)
SODIUM: 137 mmol/L (ref 135–145)
TOTAL PROTEIN: 7.7 g/dL (ref 6.5–8.1)

## 2015-01-22 LAB — CBC
HCT: 42.5 % (ref 36.0–46.0)
HEMOGLOBIN: 14.1 g/dL (ref 12.0–15.0)
MCH: 29 pg (ref 26.0–34.0)
MCHC: 33.2 g/dL (ref 30.0–36.0)
MCV: 87.4 fL (ref 78.0–100.0)
PLATELETS: 279 10*3/uL (ref 150–400)
RBC: 4.86 MIL/uL (ref 3.87–5.11)
RDW: 13.6 % (ref 11.5–15.5)
WBC: 6.2 10*3/uL (ref 4.0–10.5)

## 2015-01-22 LAB — LIPASE, BLOOD: Lipase: 23 U/L (ref 22–51)

## 2015-01-22 MED ORDER — SODIUM CHLORIDE 0.9 % IV SOLN
Freq: Once | INTRAVENOUS | Status: AC
  Administered 2015-01-22: 17:00:00 via INTRAVENOUS

## 2015-01-22 MED ORDER — HYDROMORPHONE HCL 1 MG/ML IJ SOLN
0.5000 mg | Freq: Once | INTRAMUSCULAR | Status: AC
  Administered 2015-01-22: 0.5 mg via INTRAVENOUS
  Filled 2015-01-22: qty 1

## 2015-01-22 MED ORDER — HYDROCODONE-ACETAMINOPHEN 5-325 MG PO TABS
1.0000 | ORAL_TABLET | ORAL | Status: DC | PRN
Start: 2015-01-22 — End: 2015-01-26

## 2015-01-22 MED ORDER — ONDANSETRON HCL 4 MG/2ML IJ SOLN
4.0000 mg | Freq: Once | INTRAMUSCULAR | Status: AC
  Administered 2015-01-22: 4 mg via INTRAVENOUS
  Filled 2015-01-22: qty 2

## 2015-01-22 MED ORDER — PROMETHAZINE HCL 25 MG RE SUPP: 25.0000 mg | Freq: Three times a day (TID) | RECTAL | Status: DC | PRN

## 2015-01-22 MED ORDER — HYDROCODONE-ACETAMINOPHEN 5-325 MG PO TABS
1.0000 | ORAL_TABLET | ORAL | Status: AC
  Administered 2015-01-22: 1 via ORAL
  Filled 2015-01-22: qty 1

## 2015-01-22 MED ORDER — LIRAGLUTIDE 18 MG/3ML ~~LOC~~ SOPN: 1.8000 mg | PEN_INJECTOR | Freq: Every day | SUBCUTANEOUS | Status: DC

## 2015-01-22 MED ORDER — SODIUM CHLORIDE 0.9 % IV BOLUS (SEPSIS)
1000.0000 mL | Freq: Once | INTRAVENOUS | Status: AC
  Administered 2015-01-22: 1000 mL via INTRAVENOUS

## 2015-01-22 MED ORDER — PROMETHAZINE HCL 12.5 MG PO TABS: 25.0000 mg | ORAL_TABLET | Freq: Four times a day (QID) | ORAL | Status: DC | PRN

## 2015-01-22 MED ORDER — ONDANSETRON 4 MG PO TBDP
4.0000 mg | ORAL_TABLET | Freq: Once | ORAL | Status: AC | PRN
  Administered 2015-01-22: 4 mg via ORAL
  Filled 2015-01-22: qty 1

## 2015-01-22 NOTE — ED Notes (Signed)
Patient transported to Ultrasound 

## 2015-01-22 NOTE — ED Notes (Signed)
Pt received dc instruction and prescriptions, questions answered, will go home with husband

## 2015-01-22 NOTE — ED Notes (Signed)
I did not see anything to collect from and Tammy [name] tried and was unsuccessful

## 2015-01-22 NOTE — Discharge Instructions (Signed)
Abdominal Pain Many things can cause belly (abdominal) pain. Most times, the belly pain is not dangerous. Many cases of belly pain can be watched and treated at home. HOME CARE   Do not take medicines that help you go poop (laxatives) unless told to by your doctor.  Only take medicine as told by your doctor.  Eat or drink as told by your doctor. Your doctor will tell you if you should be on a special diet. GET HELP IF:  You do not know what is causing your belly pain.  You have belly pain while you are sick to your stomach (nauseous) or have runny poop (diarrhea).  You have pain while you pee or poop.  Your belly pain wakes you up at night.  You have belly pain that gets worse or better when you eat.  You have belly pain that gets worse when you eat fatty foods.  You have a fever. GET HELP RIGHT AWAY IF:   The pain does not go away within 2 hours.  You keep throwing up (vomiting).  The pain changes and is only in the right or left part of the belly.  You have bloody or tarry looking poop. MAKE SURE YOU:   Understand these instructions.  Will watch your condition.  Will get help right away if you are not doing well or get worse. Document Released: 10/08/2007 Document Revised: 04/26/2013 Document Reviewed: 12/29/2012 [facility] Patient Information 2015 [place], [place]. This information is not intended to replace advice given to you by your health care provider. Make sure you discuss any questions you have with your health care provider. Your ultrasound shows that your ovarian cyst is stable.  It's not any larger but is still present.  Your urine shows that you are slightly dehydrated.  He been given fluids.  He was given Zofran, which is the same medicine that you been given by mouth with resolution of your nausea.  You have been given prescription for Phenergan tablets or Phenergan suppository that he continues at home for any further episodes of nausea, vomiting as well as a  Vicodin for pain.  Please follow-up with your primary care physician

## 2015-01-22 NOTE — ED Notes (Signed)
Awake. Verbally responsive. Resp even and unlabored. No audible adventitious breath sounds noted. ABC's intact. Abd soft/nondistended but tender to palpate. BS (+) and active x4 quadrants. Pt reported n/v x2-3/day and loose stools since Saturday.

## 2015-01-22 NOTE — Telephone Encounter (Signed)
Spoke with patient and she needed a refill on her Victoza and also needs labs, she is on her way to the hospital and will call for an apt when she is out.       KP

## 2015-01-22 NOTE — ED Notes (Signed)
Pt c/o n/v/diarrhea, lower abdominal pain onset Saturday. Pt actively vomiting in triage.

## 2015-01-22 NOTE — ED Notes (Signed)
Pt was notified on needing a urine sample. Pt stated that she was asked by [name] to empty bladder. No sample was kept. Pt stated she will let us know when she is able to try and provide a sample.

## 2015-01-22 NOTE — ED Notes (Signed)
Awake. Verbally responsive. A/O x4. Resp even and unlabored. No audible adventitious breath sounds noted. ABC's intact. IV saline lock patent and intact. Family at bedside. 

## 2015-01-22 NOTE — Progress Notes (Signed)
Subjective:    Patient ID: Tammy [name], female    [name]: 30-Jul-1960, 54 y.o.   MRN: [id number]  HPI   Tammy [name] is a 54 yr old female who presents today with chief complaint of abdominal pain.  Pain began on Saturday night. Pain is mainly localized to the epigastric area.   She reports associated vomiting.  Did not eat anything yesterday until last night when she ate potato soup. She managed to keep down the soup, but then this AM she had recurrent vomiting. Reports 3 loose stools in the last 24 hours. Denies sick contacts. Denies coffee ground emesis, bright red blood in vomit or stool. Denies black stools.  She reports that she swallowed some gatorade this AM.  She reports pain is severe and rates it a "15" out of 10.     Review of Systems See HPI  Past Medical History  Diagnosis Date  . GERD (gastroesophageal reflux disease)   . Hypertension   . Hyperlipidemia   . Diabetes mellitus     TYPE 2  . Pneumonia   . Sinusitis   . Acute on chronic appendicitis s/p lap appy 06/10/2012 06/10/2012  . Morbid obesity - BMI > 50 12/13/2012    Social History   Social History  . Marital Status: Married    Spouse Name: N/A  . Number of Children: 0  . Years of Education: N/A   Occupational History  . 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 Smoking status: Never Smoker   . Smokeless tobacco: Never Used  . Alcohol Use: No  . Drug Use: No  . Sexual Activity:    Partners: Male   Other Topics Concern  . Not on file   Social History Narrative    Past Surgical History  Procedure Laterality Date  . Abdominal hysterectomy    . Knee surgery Right      KNEE SURGERY   . Upper gastrointestinal endoscopy  04/14/2006    Normal  . Colonoscopy  04/11/11    5 mm polyp removed but not recovered  . Laparoscopic appendectomy  06/10/2012    Procedure: APPENDECTOMY LAPAROSCOPIC;  Surgeon: Adin Hector, MD;  Location: [facility];  Service: General;  Laterality: N/A;      Family History  Problem Relation Age of Onset  . Hyperlipidemia Mother   . Hypertension Mother   . Cancer Mother 51    hodgkins lymphoma  . Diabetes Father   . Hyperlipidemia Brother   . Colon cancer Neg Hx   . Heart attack Neg Hx   . Sudden death Neg Hx   . Diabetes Paternal Grandmother     Allergies  Allergen Reactions  . Cheese   . Red Dye   . [name] Flash [Fish Allergy] Nausea And Vomiting    Current Outpatient Prescriptions on File Prior to Visit  Medication Sig Dispense Refill  . benazepril (LOTENSIN) 40 MG tablet Take 1 tablet (40 mg total) by mouth daily. 90 tablet 3  . fluticasone (FLONASE) 50 MCG/ACT nasal spray Place 2 sprays into both nostrils daily. 48 g 3  . furosemide (LASIX) 40 MG tablet Take 1 tablet (40 mg total) by mouth daily. 90 tablet 1  . glucose blood test strip Check blood sugar twice daily 100 each 11  . Insulin Pen Needle (NOVOFINE) 32G X 6 MM MISC Inject 1.8 mg into the skin daily. 100 each 3  . Liraglutide (VICTOZA) 18 MG/3ML SOPN Inject  0.3 mLs (1.8 mg total) into the skin daily. 27 pen 3  . loratadine (CLARITIN) 10 MG tablet TAKE 1 TABLET BY MOUTH EVERY DAY 90 tablet 3  . pantoprazole (PROTONIX) 40 MG tablet Take 2 tablets (80 mg total) by mouth daily. 180 tablet 3  . potassium chloride (K-DUR,KLOR-CON) 10 MEQ tablet 2 po qd 60 tablet 5  . Elastic Bandages & Supports (FUTURO SHEER SUPPORT HOSE) MISC 1 packet by Other route daily. 1 pair of support hose to wear daily 1 each 0  . HYDROcodone-acetaminophen (NORCO) 5-325 MG per tablet Take 1-2 tablets by mouth every 4 (four) hours as needed. (Patient not taking: Reported on 01/22/2015) 30 tablet 0   No current facility-administered medications on file prior to visit.    BP 170/90 mmHg  Pulse 70  Temp(Src) 98.4 F (36.9 C) (Oral)  Resp 24  Wt 338 lb 6.4 oz (153.497 kg)  SpO2 98%       Objective:   Physical Exam  Constitutional: She is oriented to person, place, and time.  Uncomfortable  appearing, morbidly obese AA female dry heaving intermittently  HENT:  Head: Normocephalic and atraumatic.  Cardiovascular: Normal rate and regular rhythm.   No murmur heard. Pulmonary/Chest: Effort normal and breath sounds normal. No respiratory distress. She has no wheezes. She has no rales. She exhibits no tenderness.  Abdominal: Soft. She exhibits no distension. There is tenderness in the periumbilical area. There is no rebound.  Neurological: She is alert and oriented to person, place, and time.  Skin: Skin is warm and dry.  Psychiatric: She has a normal mood and affect. Her behavior is normal. Judgment and thought content normal.          Assessment & Plan:  Addendum:  After further discussion with the patient and her husband, they prefer to bring the patient to [facility] ED for evaluation.  I have advised pt and husband to follow up with PCP after she is released from ED. They verbalize understanding.

## 2015-01-22 NOTE — Progress Notes (Signed)
Pre visit review using our clinic review tool, if applicable. No additional management support is needed unless otherwise documented below in the visit note. 

## 2015-01-22 NOTE — Assessment & Plan Note (Addendum)
Pt with severe abdominal pain, nausea/vomitting, diarrhea, poor PO intake. Advised pt that I recommend she seek evaluation downstairs in our ED. Differential includes gastroenteritis, diverticulitis, cholecystits.  I have given report to Dr. Alvino Chapel who is on duty in the ED.

## 2015-01-22 NOTE — ED Provider Notes (Addendum)
CSN: [id number]     Arrival date & time 01/22/15  1204 History   First MD Initiated Contact with Patient 01/22/15 1600     Chief Complaint  Patient presents with  . Abdominal Pain  . Emesis     (Consider location/radiation/quality/duration/timing/severity/associated sxs/prior Treatment) HPI Comments: Patient with chronic recurrent abdominal pain nausea and vomiting. Patient has seen GI and OB and PCP with no definitive Dx but of not sis have a L ovarian cyst that OB was watching.  Now with 3 days of nausea, vomiting all PO intake nad occasional loose stool Has not taken any medication for her symptoms.   Patient is a 54 y.o. female presenting with abdominal pain and vomiting. The history is provided by the patient.  Abdominal Pain Pain location:  LUQ and suprapubic Pain quality: fullness and gnawing   Pain radiates to:  Does not radiate Pain severity:  Moderate Onset quality:  Gradual Duration:  2 days Timing:  Constant Progression:  Unchanged Chronicity:  Recurrent Context: not diet changes, not medication withdrawal, not recent illness, not recent sexual activity, not recent travel, not retching, not sick contacts, not suspicious food intake and not trauma   Relieved by:  Nothing Worsened by:  Nothing tried Ineffective treatments:  None tried Associated symptoms: diarrhea, nausea and vomiting   Associated symptoms: no anorexia, no chest pain, no chills, no constipation, no cough, no dysuria, no fever, no flatus, no vaginal bleeding and no vaginal discharge   Diarrhea:    Quality:  Semi-solid   Number of occurrences:  4   Severity:  Moderate   Duration:  3 days   Timing:  Intermittent   Progression:  Unchanged Nausea:    Severity:  Moderate   Onset quality:  Gradual   Duration:  3 days   Timing:  Intermittent Vomiting:    Quality:  Undigested food   Number of occurrences:  With each PO consumption    Severity:  Moderate   Duration:  3 days   Timing:  Intermittent    Progression:  Unchanged Risk factors: obesity   Risk factors: no alcohol abuse, no aspirin use, not elderly, has not had multiple surgeries, no NSAID use, not pregnant and no recent hospitalization   Emesis Severity:  Moderate Duration:  3 days Timing:  Intermittent Quality:  Bilious material Progression:  Unchanged Chronicity:  Recurrent Recent urination:  Decreased Relieved by:  Nothing Worsened by:  Nothing tried Associated symptoms: abdominal pain and diarrhea   Associated symptoms: no chills, no cough, no fever, no headaches and no URI   Abdominal pain:    Location:  Epigastric, suprapubic and LLQ   Quality:  Aching   Severity:  Moderate   Onset quality:  Gradual   Duration:  3 days Risk factors: no alcohol use, no diabetes, not pregnant now, no prior abdominal surgery, no sick contacts, no suspect food intake and no travel to endemic areas     Past Medical History  Diagnosis Date  . GERD (gastroesophageal reflux disease)   . Hypertension   . Hyperlipidemia   . Diabetes mellitus     TYPE 2  . Pneumonia   . Sinusitis   . Acute on chronic appendicitis s/p lap appy 06/10/2012 06/10/2012  . Morbid obesity - BMI > 50 12/13/2012   Past Surgical History  Procedure Laterality Date  . Abdominal hysterectomy    . Knee surgery Right      KNEE SURGERY   . Upper gastrointestinal endoscopy  04/14/2006  Normal  . Colonoscopy  04/11/11    5 mm polyp removed but not recovered  . Laparoscopic appendectomy  06/10/2012    Procedure: APPENDECTOMY LAPAROSCOPIC;  Surgeon: Adin Hector, MD;  Location: [facility];  Service: General;  Laterality: N/A;   Family History  Problem Relation Age of Onset  . Hyperlipidemia Mother   . Hypertension Mother   . Cancer Mother 35    hodgkins lymphoma  . Diabetes Father   . Hyperlipidemia Brother   . Colon cancer Neg Hx   . Heart attack Neg Hx   . Sudden death Neg Hx   . Diabetes Paternal Grandmother    Social History  Substance Use Topics  .  Smoking status: Never Smoker   . Smokeless tobacco: Never Used  . Alcohol Use: No   OB History    No data available     Review of Systems  Constitutional: Negative for fever and chills.  Respiratory: Negative for cough.   Cardiovascular: Negative for chest pain.  Gastrointestinal: Positive for nausea, vomiting, abdominal pain and diarrhea. Negative for constipation, blood in stool, abdominal distention, anal bleeding, rectal pain, anorexia and flatus.  Genitourinary: Negative for dysuria, frequency, vaginal bleeding, vaginal discharge and vaginal pain.  Skin: Negative for pallor.  Neurological: Negative for headaches.  All other systems reviewed and are negative.     Allergies  Cheese; Red dye; and Tuna  Home Medications   Prior to Admission medications   Medication Sig Start Date End Date Taking? Authorizing Faustino Luecke  pantoprazole (PROTONIX) 40 MG tablet Take 2 tablets (80 mg total) by mouth daily. 08/17/14  Yes Rosalita Chessman, DO  acetaminophen (TYLENOL) 325 MG tablet Take 2 tablets (650 mg total) by mouth every 6 (six) hours as needed for mild pain, moderate pain, fever or headache. 01/29/15   Modena Jansky, MD  amLODipine (NORVASC) 10 MG tablet Take 1 tablet (10 mg total) by mouth daily. 01/29/15   Modena Jansky, MD  ciprofloxacin (CIPRO) 500 MG tablet Take 1 tablet (500 mg total) by mouth 2 (two) times daily. 01/29/15   Modena Jansky, MD  Elastic Bandages & Supports (FUTURO SHEER SUPPORT HOSE) MISC 1 packet by Other route daily. 1 pair of support hose to wear daily 08/10/14   Rosalita Chessman, DO  fluticasone (FLONASE) 50 MCG/ACT nasal spray Place 2 sprays into both nostrils daily as needed for allergies. 01/29/15   Modena Jansky, MD  furosemide (LASIX) 40 MG tablet Take 1 tablet (40 mg total) by mouth every other day. 01/29/15 05/27/16  Modena Jansky, MD  glucose blood test strip Check blood sugar twice daily 05/25/14   Rosalita Chessman, DO  Insulin Pen Needle (NOVOFINE)  32G X 6 MM MISC Inject 1.8 mg into the skin daily. 08/17/14   Rosalita Chessman, DO  Liraglutide (VICTOZA) 18 MG/3ML SOPN Inject 0.3 mLs (1.8 mg total) into the skin daily. 01/22/15   Rosalita Chessman, DO  loratadine (CLARITIN) 10 MG tablet Take 1 tablet (10 mg total) by mouth daily as needed for allergies. 01/29/15   Modena Jansky, MD  metroNIDAZOLE (FLAGYL) 250 MG tablet Take 1 tablet (250 mg total) by mouth 3 (three) times daily. 01/29/15   Modena Jansky, MD  potassium chloride (K-DUR,KLOR-CON) 10 MEQ tablet Take 2 tablets (20 mEq total) by mouth daily. 01/29/15   Modena Jansky, MD   BP 158/73 mmHg  Pulse 65  Temp(Src) 97.9 F (36.6 C) (Oral)  Resp 19  SpO2 98% Physical Exam  Constitutional: She appears well-developed and well-nourished.  HENT:  Head: Normocephalic.  Eyes: Pupils are equal, round, and reactive to light.  Neck: Normal range of motion.  Cardiovascular: Normal rate and regular rhythm.   Pulmonary/Chest: Effort normal and breath sounds normal.  Abdominal: Soft. She exhibits no distension. There is tenderness in the suprapubic area and left upper quadrant.  Musculoskeletal: Normal range of motion.  Neurological: She is alert.  Skin: Skin is warm.  Nursing note and vitals reviewed.   ED Course  Procedures (including critical care time) Labs Review Labs Reviewed  COMPREHENSIVE METABOLIC PANEL - Abnormal; Notable for the following:    CO2 21 (*)    Glucose, Bld 119 (*)    Calcium 8.4 (*)    All other components within normal limits  URINALYSIS, ROUTINE W REFLEX MICROSCOPIC (NOT AT [facility]) - Abnormal; Notable for the following:    Ketones, ur 40 (*)    All other components within normal limits  LIPASE, BLOOD  CBC    Imaging Review No results found. I have personally reviewed and evaluated these images and lab results as part of my medical decision-making.   EKG Interpretation None     Ultrasound reviewed.  Patient still has left ovarian cyst with a large  amount of free pelvic fluid/ascites.  Normal white count, normal electrolytes, although she does have ketones in urine.  She's been given IV fluids and antiemetics At time of discharge, patient is now vomiting stating she can't go home MDM   Final diagnoses:  Lower abdominal pain  Ovarian cyst, left  Non-intractable vomiting with nausea, vomiting of unspecified type         Junius Creamer, NP 01/22/15 [id number]  Leonard Schwartz, MD 01/22/15 2331  Junius Creamer, NP 01/30/15 [id number]  Leonard Schwartz, MD 02/02/15 [phone]

## 2015-01-22 NOTE — ED Notes (Addendum)
Awake. Verbally responsive. Resp even and unlabored. No audible adventitious breath sounds noted. ABC's intact. Abd soft/nondistended but tender to palpate. BS (+) and active x4 quadrants. No N/V/D reported at this time. IV infusing NS at 917ml/hr without difficulty.

## 2015-01-22 NOTE — ED Notes (Signed)
Ultrasound Bedside.

## 2015-01-22 NOTE — ED Notes (Signed)
Attempted IV venipuncture x1 unsuccessful. Will have another nurse to attempt. 

## 2015-01-23 MED ORDER — LORAZEPAM 1 MG PO TABS
1.0000 mg | ORAL_TABLET | Freq: Once | ORAL | Status: AC
  Administered 2015-01-23: 1 mg via ORAL
  Filled 2015-01-23 (×2): qty 1

## 2015-01-23 MED ORDER — HYDROMORPHONE HCL 1 MG/ML IJ SOLN
1.0000 mg | Freq: Once | INTRAMUSCULAR | Status: AC
  Administered 2015-01-23: 1 mg via INTRAVENOUS
  Filled 2015-01-23: qty 1

## 2015-01-23 NOTE — ED Notes (Signed)
Pt felt better after medications and left. Stable upon discharge. Pt was wheeled out. Alert and oriented upon d/c. Left with husband. Told to come back if she has any complications or worsening symptoms but otherwise to f/u with her regular doctor.

## 2015-01-23 NOTE — ED Notes (Signed)
Explained to pt that nausea/vomiting is probably from the pain medication. Pt is nauseated but no vomit seen. Dry heaves. Informed that we are waiting on the MD.

## 2015-01-25 ENCOUNTER — Inpatient Hospital Stay (HOSPITAL_BASED_OUTPATIENT_CLINIC_OR_DEPARTMENT_OTHER)
Admission: EM | Admit: 2015-01-25 | Discharge: 2015-01-29 | DRG: 394 | Disposition: A | Discharge status: Home / Self Care | Payer: 59 | Attending: Internal Medicine | Admitting: Internal Medicine

## 2015-01-25 ENCOUNTER — Emergency Department (HOSPITAL_BASED_OUTPATIENT_CLINIC_OR_DEPARTMENT_OTHER): Payer: 59

## 2015-01-25 ENCOUNTER — Encounter: Payer: Self-pay | Admitting: Family Medicine

## 2015-01-25 ENCOUNTER — Observation Stay (HOSPITAL_COMMUNITY): Payer: 59

## 2015-01-25 ENCOUNTER — Encounter (HOSPITAL_BASED_OUTPATIENT_CLINIC_OR_DEPARTMENT_OTHER): Payer: Self-pay | Admitting: *Deleted

## 2015-01-25 ENCOUNTER — Ambulatory Visit (INDEPENDENT_AMBULATORY_CARE_PROVIDER_SITE_OTHER): Payer: 59 | Admitting: Family Medicine

## 2015-01-25 ENCOUNTER — Inpatient Hospital Stay (HOSPITAL_COMMUNITY): Payer: 59

## 2015-01-25 ENCOUNTER — Other Ambulatory Visit: Payer: Self-pay

## 2015-01-25 ENCOUNTER — Telehealth: Payer: Self-pay | Admitting: Internal Medicine

## 2015-01-25 VITALS — BP 160/80 | HR 81 | Temp 98.6°F | Resp 16

## 2015-01-25 DIAGNOSIS — Z807 Family history of other malignant neoplasms of lymphoid, hematopoietic and related tissues: Secondary | ICD-10-CM

## 2015-01-25 DIAGNOSIS — Z8249 Family history of ischemic heart disease and other diseases of the circulatory system: Secondary | ICD-10-CM

## 2015-01-25 DIAGNOSIS — K559 Vascular disorder of intestine, unspecified: Principal | ICD-10-CM | POA: Diagnosis present

## 2015-01-25 DIAGNOSIS — I872 Venous insufficiency (chronic) (peripheral): Secondary | ICD-10-CM | POA: Diagnosis present

## 2015-01-25 DIAGNOSIS — R933 Abnormal findings on diagnostic imaging of other parts of digestive tract: Secondary | ICD-10-CM

## 2015-01-25 DIAGNOSIS — K529 Noninfective gastroenteritis and colitis, unspecified: Secondary | ICD-10-CM | POA: Diagnosis not present

## 2015-01-25 DIAGNOSIS — Z91011 Allergy to milk products: Secondary | ICD-10-CM

## 2015-01-25 DIAGNOSIS — T464X5A Adverse effect of angiotensin-converting-enzyme inhibitors, initial encounter: Secondary | ICD-10-CM | POA: Diagnosis present

## 2015-01-25 DIAGNOSIS — K3184 Gastroparesis: Secondary | ICD-10-CM | POA: Diagnosis present

## 2015-01-25 DIAGNOSIS — R188 Other ascites: Secondary | ICD-10-CM | POA: Diagnosis not present

## 2015-01-25 DIAGNOSIS — K449 Diaphragmatic hernia without obstruction or gangrene: Secondary | ICD-10-CM | POA: Diagnosis present

## 2015-01-25 DIAGNOSIS — Z6841 Body Mass Index (BMI) 40.0 and over, adult: Secondary | ICD-10-CM

## 2015-01-25 DIAGNOSIS — R1115 Cyclical vomiting syndrome unrelated to migraine: Secondary | ICD-10-CM | POA: Diagnosis present

## 2015-01-25 DIAGNOSIS — R1010 Upper abdominal pain, unspecified: Secondary | ICD-10-CM

## 2015-01-25 DIAGNOSIS — I878 Other specified disorders of veins: Secondary | ICD-10-CM

## 2015-01-25 DIAGNOSIS — Z91013 Allergy to seafood: Secondary | ICD-10-CM

## 2015-01-25 DIAGNOSIS — R1110 Vomiting, unspecified: Secondary | ICD-10-CM | POA: Diagnosis not present

## 2015-01-25 DIAGNOSIS — R609 Edema, unspecified: Secondary | ICD-10-CM

## 2015-01-25 DIAGNOSIS — E1165 Type 2 diabetes mellitus with hyperglycemia: Secondary | ICD-10-CM

## 2015-01-25 DIAGNOSIS — E785 Hyperlipidemia, unspecified: Secondary | ICD-10-CM | POA: Diagnosis present

## 2015-01-25 DIAGNOSIS — E119 Type 2 diabetes mellitus without complications: Secondary | ICD-10-CM

## 2015-01-25 DIAGNOSIS — R112 Nausea with vomiting, unspecified: Secondary | ICD-10-CM

## 2015-01-25 DIAGNOSIS — K219 Gastro-esophageal reflux disease without esophagitis: Secondary | ICD-10-CM | POA: Diagnosis present

## 2015-01-25 DIAGNOSIS — E1143 Type 2 diabetes mellitus with diabetic autonomic (poly)neuropathy: Secondary | ICD-10-CM | POA: Diagnosis present

## 2015-01-25 DIAGNOSIS — T783XXA Angioneurotic edema, initial encounter: Secondary | ICD-10-CM | POA: Diagnosis present

## 2015-01-25 DIAGNOSIS — X58XXXA Exposure to other specified factors, initial encounter: Secondary | ICD-10-CM | POA: Diagnosis present

## 2015-01-25 DIAGNOSIS — G8929 Other chronic pain: Secondary | ICD-10-CM | POA: Diagnosis present

## 2015-01-25 DIAGNOSIS — E538 Deficiency of other specified B group vitamins: Secondary | ICD-10-CM | POA: Diagnosis present

## 2015-01-25 DIAGNOSIS — Z833 Family history of diabetes mellitus: Secondary | ICD-10-CM

## 2015-01-25 DIAGNOSIS — J849 Interstitial pulmonary disease, unspecified: Secondary | ICD-10-CM | POA: Diagnosis present

## 2015-01-25 DIAGNOSIS — I10 Essential (primary) hypertension: Secondary | ICD-10-CM | POA: Diagnosis present

## 2015-01-25 DIAGNOSIS — Z79899 Other long term (current) drug therapy: Secondary | ICD-10-CM

## 2015-01-25 DIAGNOSIS — E876 Hypokalemia: Secondary | ICD-10-CM

## 2015-01-25 DIAGNOSIS — Z9102 Food additives allergy status: Secondary | ICD-10-CM

## 2015-01-25 DIAGNOSIS — R109 Unspecified abdominal pain: Secondary | ICD-10-CM

## 2015-01-25 DIAGNOSIS — J302 Other seasonal allergic rhinitis: Secondary | ICD-10-CM

## 2015-01-25 DIAGNOSIS — E1151 Type 2 diabetes mellitus with diabetic peripheral angiopathy without gangrene: Secondary | ICD-10-CM

## 2015-01-25 LAB — COMPREHENSIVE METABOLIC PANEL
ALBUMIN: 3.7 g/dL (ref 3.5–5.0)
ALK PHOS: 58 U/L (ref 38–126)
ALT: 15 U/L (ref 14–54)
AST: 22 U/L (ref 15–41)
Anion gap: 10 (ref 5–15)
BILIRUBIN TOTAL: 1 mg/dL (ref 0.3–1.2)
BUN: 11 mg/dL (ref 6–20)
CO2: 23 mmol/L (ref 22–32)
CREATININE: 0.84 mg/dL (ref 0.44–1.00)
Calcium: 8.1 mg/dL — ABNORMAL LOW (ref 8.9–10.3)
Chloride: 104 mmol/L (ref 101–111)
GFR calc Af Amer: >60 mL/min (ref >60)
GLUCOSE: 96 mg/dL (ref 65–99)
POTASSIUM: 3.5 mmol/L (ref 3.5–5.1)
Sodium: 137 mmol/L (ref 135–145)
TOTAL PROTEIN: 7.2 g/dL (ref 6.5–8.1)

## 2015-01-25 LAB — HCG, QUANTITATIVE, PREGNANCY: hCG, Beta Chain, Quant, S: <1 m[IU]/mL (ref <5)

## 2015-01-25 LAB — CBC WITH DIFFERENTIAL/PLATELET
BASOS PCT: 2 %
Basophils Absolute: 0.1 10*3/uL (ref 0.0–0.1)
EOS PCT: 0 %
Eosinophils Absolute: 0 10*3/uL (ref 0.0–0.7)
HEMATOCRIT: 42.3 % (ref 36.0–46.0)
HEMOGLOBIN: 14.1 g/dL (ref 12.0–15.0)
LYMPHS PCT: 20 %
Lymphs Abs: 1.1 10*3/uL (ref 0.7–4.0)
MCH: 28.4 pg (ref 26.0–34.0)
MCHC: 33.3 g/dL (ref 30.0–36.0)
MCV: 85.3 fL (ref 78.0–100.0)
MONOS PCT: 5 %
Monocytes Absolute: 0.3 10*3/uL (ref 0.1–1.0)
NEUTROS ABS: 4.1 10*3/uL (ref 1.7–7.7)
NEUTROS PCT: 73 %
Platelets: 205 10*3/uL (ref 150–400)
RBC: 4.96 MIL/uL (ref 3.87–5.11)
RDW: 13.2 % (ref 11.5–15.5)
WBC: 5.6 10*3/uL (ref 4.0–10.5)

## 2015-01-25 LAB — LIPASE, BLOOD: LIPASE: 17 U/L — AB (ref 22–51)

## 2015-01-25 MED ORDER — SODIUM CHLORIDE 0.9 % IV BOLUS (SEPSIS)
1000.0000 mL | Freq: Once | INTRAVENOUS | Status: AC
  Administered 2015-01-25: 1000 mL via INTRAVENOUS

## 2015-01-25 MED ORDER — IOHEXOL 300 MG/ML  SOLN
100.0000 mL | Freq: Once | INTRAMUSCULAR | Status: AC | PRN
Start: 2015-01-25 — End: 2015-01-25
  Administered 2015-01-25: 100 mL via INTRAVENOUS

## 2015-01-25 MED ORDER — SODIUM CHLORIDE 0.9 % IV SOLN: INTRAVENOUS | Status: AC

## 2015-01-25 MED ORDER — HYDROMORPHONE HCL 1 MG/ML IJ SOLN: 0.5000 mg | INTRAMUSCULAR | Status: DC | PRN

## 2015-01-25 MED ORDER — CIPROFLOXACIN IN D5W 400 MG/200ML IV SOLN
400.0000 mg | Freq: Once | INTRAVENOUS | Status: AC
  Administered 2015-01-25: 400 mg via INTRAVENOUS
  Filled 2015-01-25: qty 200

## 2015-01-25 MED ORDER — OXYCODONE HCL 5 MG PO TABS
5.0000 mg | ORAL_TABLET | Freq: Four times a day (QID) | ORAL | Status: DC | PRN
  Filled 2015-01-25: qty 1

## 2015-01-25 MED ORDER — MORPHINE SULFATE (PF) 4 MG/ML IV SOLN
4.0000 mg | INTRAVENOUS | Status: DC | PRN
  Administered 2015-01-25: 4 mg via INTRAVENOUS
  Filled 2015-01-25: qty 1

## 2015-01-25 MED ORDER — HYDROMORPHONE HCL 1 MG/ML IJ SOLN: 1.0000 mg | INTRAMUSCULAR | Status: DC | PRN

## 2015-01-25 MED ORDER — FAMOTIDINE IN NACL 20-0.9 MG/50ML-% IV SOLN
20.0000 mg | Freq: Once | INTRAVENOUS | Status: AC
  Administered 2015-01-25: 20 mg via INTRAVENOUS
  Filled 2015-01-25: qty 50

## 2015-01-25 MED ORDER — HYDROMORPHONE HCL 1 MG/ML PO LIQD
1.0000 mg | ORAL | Status: DC | PRN
  Administered 2015-01-25 – 2015-01-27 (×10): 1 mg via ORAL
  Filled 2015-01-25 (×10): qty 1

## 2015-01-25 MED ORDER — ONDANSETRON HCL 4 MG/2ML IJ SOLN: 4.0000 mg | Freq: Three times a day (TID) | INTRAMUSCULAR | Status: DC | PRN

## 2015-01-25 MED ORDER — METRONIDAZOLE IN NACL 5-0.79 MG/ML-% IV SOLN
500.0000 mg | Freq: Once | INTRAVENOUS | Status: DC
  Filled 2015-01-25: qty 100

## 2015-01-25 MED ORDER — ONDANSETRON HCL 4 MG/2ML IJ SOLN
4.0000 mg | Freq: Once | INTRAMUSCULAR | Status: AC
  Administered 2015-01-25: 4 mg via INTRAVENOUS
  Filled 2015-01-25: qty 2

## 2015-01-25 MED ORDER — IOHEXOL 300 MG/ML  SOLN
25.0000 mL | Freq: Once | INTRAMUSCULAR | Status: AC | PRN
  Administered 2015-01-25: 25 mL via ORAL

## 2015-01-25 NOTE — ED Notes (Signed)
Patient transported to CT 

## 2015-01-25 NOTE — ED Notes (Signed)
Paged ---Dr. Jacqualyn Posey for Elmyra Ricks direct--will call back on [phone]

## 2015-01-25 NOTE — ED Notes (Signed)
Abdominal pain for a week. Abdominal bloating, vomiting. She has been seen at [facility], [place] and by her MD upstairs without a diagnosis. She was sent her for admission.

## 2015-01-25 NOTE — Progress Notes (Signed)
Pre visit review using our clinic review tool, if applicable. No additional management support is needed unless otherwise documented below in the visit note. 

## 2015-01-25 NOTE — Telephone Encounter (Signed)
Patient advised.  She is notified that paracentesis is scheduled for 01/29/15 at 11:00.  She is asked to arrive at 10:45 and be 4 hours NPO.  All questions answered.  She verbalized understanding of all instructions.

## 2015-01-25 NOTE — H&P (Signed)
History and Physical  Tammy [name]  [name]:[id number]  [name]: February 27, 1961  DOA: 01/25/2015  Referring physician: Monico Blitz, PA-C PCP: Tammy Koyanagi, DO   Chief Complaint: "This pain is so terrible I can't sleep, can't eat or keep anything down."  HPI: Tammy [name] is a 54 y.o. female with a past medical history significant for hypertension, type 2 diabetes on Victoza, GERD, and chronic abdominal pain who presents with 5 days severe, colicky abdominal pain, vomiting, and inability to take by mouth.  From chart review of her appointment with Dr. Carlean [name] in 2014, the patient appears to have a history of abdominal pain and vomiting episodes dating back about 10 years. This was initially evaluated with an EGD and a small bowel follow-through that were both normal.  Now in the last 5 days the patient describes gradual onset of diffuse sharp and colicky abdominal pain, some watery bowel movements, as well as nausea and vomiting. There is been no hematemesis, melena, or hematochezia. She has not had fever, chest pain, shortness of breath. No recent travel. No recent consumption of raw seafood.  Since the beginning of this episode the patient has been to the ER at [facility], the [place], and her PCPs office twice, without relief of pain or nausea. Of note the patient had called Dr. Celesta [name] office who scheduled her for a paracentesis this coming Monday.  In the ED tonight, the patient had no evidence of leukocytosis, no transaminitis, no elevation in bilirubin, no elevation in lipase, and normal serum electrolytes. A CT showed a new focal segment of small bowel wall thickening. The case was discussed with IR who agreed to put her on the list for a paracentesis tomorrow.       Review of Systems:  Patient seen 9:17 PM on 01/25/2015. Pt complains of abdominal pain, emesis, nausea, watery bowel movement, facial flushing. Pt denies any fever, chills, rashes, oral ulcers, joint pain.   Twelve systems were reviewed and were negative except as noted above in the history of present illness.  Past Medical History  Diagnosis Date  . GERD (gastroesophageal reflux disease)   . Hypertension   . Hyperlipidemia   . Diabetes mellitus     TYPE 2  . Pneumonia   . Sinusitis   . Acute on chronic appendicitis s/p lap appy 06/10/2012 06/10/2012  . Morbid obesity - BMI > 50 12/13/2012   The above past medical history was reviewed. Past Surgical History  Procedure Laterality Date  . Abdominal hysterectomy    . Knee surgery Right      KNEE SURGERY   . Upper gastrointestinal endoscopy  04/14/2006    Normal  . Colonoscopy  04/11/11    5 mm polyp removed but not recovered  . Laparoscopic appendectomy  06/10/2012    Procedure: APPENDECTOMY LAPAROSCOPIC;  Surgeon: Adin Hector, MD;  Location: [facility] ORS;  Service: General;  Laterality: N/A;   The above surgical history was reviewed.  Social History:  reports that she has never smoked. She has never used smokeless tobacco. She reports that she does not drink alcohol or use illicit drugs. Patient lives with her husband. 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 She is a nonsmoker and does not drink.  Allergies  Allergen Reactions  . Cheese Nausea And Vomiting  . Red Dye Nausea And Vomiting  . [name] [name] [Fish Allergy] Nausea And Vomiting    Family History  Problem Relation Age of Onset  . Hyperlipidemia Mother   .  Hypertension Mother   . Cancer Mother 84    hodgkins lymphoma  . Diabetes Father   . Hyperlipidemia Brother   . Colon cancer Neg Hx   . Heart attack Neg Hx   . Sudden death Neg Hx   . Diabetes Paternal Grandmother    her mother had Hodgkin's disease, and her father had type 2 diabetes.  Prior to Admission medications   Medication Sig Start Date End Date Taking? Authorizing Provider  benazepril (LOTENSIN) 40 MG tablet Take 1 tablet (40 mg total) by mouth daily. 08/17/14  Yes Rosalita Chessman, DO  Elastic Bandages &  Supports (FUTURO SHEER SUPPORT HOSE) MISC 1 packet by Other route daily. 1 pair of support hose to wear daily 08/10/14  Yes Yvonne R Lowne, DO  fluticasone (FLONASE) 50 MCG/ACT nasal spray Place 2 sprays into both nostrils daily. Patient taking differently: Place 2 sprays into both nostrils daily as needed for allergies.  08/17/14  Yes Yvonne R Lowne, DO  furosemide (LASIX) 40 MG tablet Take 1 tablet (40 mg total) by mouth daily. Patient taking differently: Take 40 mg by mouth every other day.  10/13/13 02/09/15 Yes Alferd Apa Lowne, DO  glucose blood test strip Check blood sugar twice daily 05/25/14  Yes Yvonne R Lowne, DO  Insulin Pen Needle (NOVOFINE) 32G X 6 MM MISC Inject 1.8 mg into the skin daily. 08/17/14  Yes Rosalita Chessman, DO  Liraglutide (VICTOZA) 18 MG/3ML SOPN Inject 0.3 mLs (1.8 mg total) into the skin daily. 01/22/15  Yes Yvonne R Lowne, DO  loratadine (CLARITIN) 10 MG tablet TAKE 1 TABLET BY MOUTH EVERY DAY Patient taking differently: Take 10 mg by mouth daily as needed for allergies.  08/17/14  Yes Yvonne R Lowne, DO  pantoprazole (PROTONIX) 40 MG tablet Take 2 tablets (80 mg total) by mouth daily. 08/17/14  Yes Yvonne R Lowne, DO  potassium chloride (K-DUR,KLOR-CON) 10 MEQ tablet 2 po qd Patient taking differently: Take 20 mEq by mouth daily. 2 po qd 08/10/14  Yes Alferd Apa Lowne, DO  promethazine (PHENERGAN) 12.5 MG tablet Take 2 tablets (25 mg total) by mouth every 6 (six) hours as needed for nausea or vomiting. 01/22/15  Yes Junius Creamer, NP  promethazine (PHENERGAN) 25 MG suppository Place 1 suppository (25 mg total) rectally every 8 (eight) hours as needed for nausea or vomiting. 01/22/15  Yes Junius Creamer, NP  HYDROcodone-acetaminophen (NORCO) 5-325 MG per tablet Take 1 tablet by mouth every 4 (four) hours as needed. Patient not taking: Reported on 01/25/2015 01/22/15   Junius Creamer, NP    Physical Exam: BP 172/74 mmHg  Pulse 53  Temp(Src) 99 F (37.2 C) (Oral)  Resp 19  Ht 5\' 6"  (1.676  m)  Wt 153.316 kg (338 lb)  BMI 54.58 kg/m2  SpO2 99% General: Adult female, alert and in occasional moderate distress from pain .  Responds appropriately to questions.  Eye contact, dress and hygiene appropriate. HEENT: Head normal.  Corneas clear, conjunctivae and sclerae normal without injection or icterus, lids and lashes normal.  PERRL and EOMI.  Nose normal.  OP moist without erythema, exudates, cobblestoning, or ulcers.  No airway deformities.  Neck supple. No thyromegaly.  Lymph: No cervical or axillary lymphadenopathy. Skin: Warm and dry.  No jaundice.  No suspicious rashes or lesions.  Mild flushing of cheeks bilaterally. Cardiac: RRR, nl S1-S2, no murmurs appreciated.  Capillary refill is less than 2 seconds.  No LE edema.  Radial and DP pulses  2+ and symmetric. Respiratory: Normal respiratory rate and rhythm.  CTAB without rales or wheezes or rubs. Abdomen: BS present.  abdomen soft. TTP without rebound diffusely.  No masses or organomegaly.  No ascites, distension detected on exam, limited by body habitus. Neuro: Sensorium intact.  Cranial nerves 3-12 intact.  Speech is fluent.  Naming is grossly intact, and the patient's recall, recent and remote, as well as general fund of knowledge seem within normal limits.  Muscle bulk normal.  5/5 grip strength and elbow and wrist flexion/extension, symmetrically.  5/5 hip flexion symmetrically.  Moves all extremities equally and with normal coordination.    Psych: Appropriate affect.  Normal rate and rhythm of speech.  Thought content appropriate, and thought process linear.  No evidence of aural or visual hallucinations or delusions. Attention and concentration are normal.           Labs on Admission:  The metabolic panel is notable for normal sodium, potassium, creatinine. Normal AST, ALT, and total bilirubin. Normal lipase. Negative pregnancy test.  The complete blood count is notable for no leukocytosis.  Radiological Exams on  Admission: Personally reviewed: Ct Abdomen Pelvis W Contrast 01/25/2015    IMPRESSION:  1. Moderate diffuse wall thickening with mild adjacent mesenteric soft tissue stranding involving a small bowel loop in the right mid lower abdomen and upper pelvis. Differential considerations include infectious and inflammatory enteritis. Ischemic enteritis is less likely but not excluded. Small bowel lymphoma can also have this appearance.  2. Small amount of ascites, with improvement.  3. Small hiatal hernia.  4. Chronic interstitial lung disease or interstitial edema at the lung bases.   Assessment/Plan Present on Admission:  . Enteritis . Ascites . Emesis, persistent . Essential hypertension . Chronic vomiting   1. Abdominal pain, acute, recurrent:  The patient presents with a 5 day episode of colicky severe abdominal pain associated with nausea. She has a CT scan that rules out diverticulitis or abscess in whose differential suggests infectious or inflammatory enteritis (Campylobacter? Yersinia?) less likely ischemic enteritis and also possibly small bowel lymphoma.   However this is the context of about a decade of similar episodes for which she has been seen by Dr. Carlean [name], had a normal EGD and small bowel follow-through and previously normal CT scans of the abdomen and pelvis.  Ascites was presumed to be contributing to her pain. Recurrent angioedema as a result of her home benazepril has been raised as a possibility. -Nothing by mouth -US-guided paracentesis tomorrow, radiology is aware -Albumin, LDH, protein, and glucose as well as culture of peritoneal fluid -Metronidazole 250 mg 3 times daily and Cipro 500 mg twice daily both by mouth given lack of IV access -Zofran 4 mg when necessary for nausea -Hydromorphone 1 mg oral every 3 hours for pain   2. IV access: The patient lost her IV access at [facility], and staff at [facility] were not able to obtain another IV after  multiple attempts. -The PICC line was discussed with the patient and will be placed tomorrow morning by the IV team.   3. Type 2 diabetes: Well controlled. -Continue Victoza, with caveat that this is conceivably a medicine that might exacerbate abdominal pain and nausea.   4. Hypertension: Not controlled. Restart home benazepril.   5. History of GERD and dyspepsia Continue home pantoprazole.   6. Chronic venous insufficiency: After paracentesis. Furosemide as ordered   DVT PPx: Low risk  Diet: Nothing by mouth  Consultants: Interventional radiology  Code Status: Full  Family Communication: The case was discussed with the patient's husband at the bedside, and all questions were answered.   Disposition Plan:  Admission for observation is judged to be reasonable and necessary at this time because the patient's presenting symptoms, physical exam findings, and initial radiographic and laboratory data in the context of chronic comorbidities is felt to place him/her at high risk for further clinical deterioration but it is anticipated at this time that the patient may be medically stable for discharge from the hospital within 2 midnights of admission.  A. The patient's presenting symptoms include abdominal pain, nausea, vomiting. B. The worrisome physical exam findings include abdominal tenderness. C. The initial radiographic and laboratory data are worrisome because of focal bowel wall thickening.      Edwin Dada [facility] Pager [phone]

## 2015-01-25 NOTE — Assessment & Plan Note (Signed)
New to provider, ongoing for pt x1 week.  Pt has been to PCP office x2, [place], [facility] ER, and called GI.  Imaging done at both ERs show ascites.  No recent CT scan (last done in April)  The current thought from GI is that the ascites is causing her pain.  She is scheduled for paracentesis but this is not for 4 days.  Pt and husband are pleading in office to have something done sooner.  No obvious cause for ascites noted in pt's chart.  Pt is not able to tolerate fluids or take home meds due to nausea and pain.  She has no pain relief w/ home hydrocodone and minimal nausea relief w/ phenergan.  It is my opinion that pt needs admission for pain control, fluids, and possible work up for cause of ascites.  I have discussed this w/ ER PA and pt will be sent to [place] ER for complete evaluation and likely admission.  Pt and husband are very appreciative.

## 2015-01-25 NOTE — ED Notes (Signed)
Recalled--Dr. Jacqualyn Posey

## 2015-01-25 NOTE — ED Notes (Signed)
Repaged doctor for Elmyra Ricks to return call to [phone]

## 2015-01-25 NOTE — Telephone Encounter (Signed)
US guided paracentesis take up to 5 L and send for cell count + diff, culture, amylase, albumin, cytology   [name] at [facility] was limited RUQ Pelvic US at [facility] showed large ascites  My Link Snuffer is ascites causing sxs

## 2015-01-25 NOTE — ED Notes (Signed)
Nunzio Cobbs, RN at bedside to attempt IV placement with [name]

## 2015-01-25 NOTE — ED Notes (Signed)
Multiple ED staff at bedside to attempt iv placement

## 2015-01-25 NOTE — Telephone Encounter (Signed)
Patient c/o upper abdominal pain and epigastric pain.  She has had 2 ER visits with no diagnosis, once at [facility] then at [facility].  She reports that she is not able to tolerate a diet, "everything comes back up".     ER note from [facility] "Ultrasound reviewed. Patient still has left ovarian cyst with a large amount of free pelvic fluid/ascites. Normal white count, normal electrolytes, although she does have ketones in urine. She's been given IV fluids and antiemetics"  [name] from [facility] yesterday states a minimal pockets of perihepatic ascites seen along the inferior margin of the right hemiliver extending from the gallbladder fossa to Morrison's pouch.    Patient was advised to follow up with GI.  Please advise.  I will put ER records from [facility] in your office inbasket.

## 2015-01-25 NOTE — ED Notes (Addendum)
Bernita Raisin and PA student at bedside.

## 2015-01-25 NOTE — ED Notes (Signed)
Pt will go to [facility] room 1522

## 2015-01-25 NOTE — ED Provider Notes (Signed)
CSN: [id number]     Arrival date & time 01/25/15  1217 History   First MD Initiated Contact with Patient 01/25/15 1217     Chief Complaint  Patient presents with  . Abdominal Pain     (Consider location/radiation/quality/duration/timing/severity/associated sxs/prior Treatment) HPI  Blood pressure 168/86, pulse 60, temperature 98.7 F (37.1 C), temperature source Oral, resp. rate 16, height 5\' 6"  (1.676 m), weight 338 lb (153.316 kg), SpO2 100 %.  AYSSA [name] is a 54 y.o. female sent from primary care for admission for intractable emesis and persistent abdominal pain. Patient's past medical history significant for diabetes, hypertension, hyper listed dyslipidemia and morbid obesity complaining of bilious emesis last episode last night and persistent diffuse crampy abdominal pain. Patient is not able to tolerate by mouth's and has not been able to keep her hypertension and diabetes medication down for several days, she states that she has not eaten in 4 days. States that she has a normal urine output with reduced defecation but she is passing flatus normally. Patient has been seen 3 times in the last 48 hours, briefly patient has had a left ovarian cyst and pelvic ascites, recent visit to [facility] she also has perihepatic ascites. Concern is for possible ovarian cancer in the source of her ascites. She had one pregnancy which ended in miscarriage. Patient is followed with her OB/GYN Dr. Toney Rakes.  She has not been to see GYN oncology. She has a appointment set up with Dr. Carlean Purl for a paracentesis 4 days from now however, Dr. Gareth Eagle he believes that this should be expedited. Patient denies fever, chills, hematemesis. Endorses dry cough. She been taking Vicodin and Phenergan at home with little relief.  PCP [facility].Mollie [place]  Past Medical History  Diagnosis Date  . GERD (gastroesophageal reflux disease)   . Hypertension   . Hyperlipidemia   . Diabetes mellitus     TYPE  2  . Pneumonia   . Sinusitis   . Acute on chronic appendicitis s/p lap appy 06/10/2012 06/10/2012  . Morbid obesity - BMI > 50 12/13/2012   Past Surgical History  Procedure Laterality Date  . Abdominal hysterectomy    . Knee surgery Right      KNEE SURGERY   . Upper gastrointestinal endoscopy  04/14/2006    Normal  . Colonoscopy  04/11/11    5 mm polyp removed but not recovered  . Laparoscopic appendectomy  06/10/2012    Procedure: APPENDECTOMY LAPAROSCOPIC;  Surgeon: Adin Hector, MD;  Location: [facility];  Service: General;  Laterality: N/A;   Family History  Problem Relation Age of Onset  . Hyperlipidemia Mother   . Hypertension Mother   . Cancer Mother 78    hodgkins lymphoma  . Diabetes Father   . Hyperlipidemia Brother   . Colon cancer Neg Hx   . Heart attack Neg Hx   . Sudden death Neg Hx   . Diabetes Paternal Grandmother    Social History  Substance Use Topics  . Smoking status: Never Smoker   . Smokeless tobacco: Never Used  . Alcohol Use: No   OB History    No data available     Review of Systems  10 systems reviewed and found to be negative, except as noted in the HPI.   Allergies  Cheese; Red dye; and Tuna  Home Medications   Prior to Admission medications   Medication Sig Start Date End Date Taking? Authorizing Provider  benazepril (LOTENSIN) 40 MG  tablet Take 1 tablet (40 mg total) by mouth daily. 08/17/14   Rosalita Chessman, DO  Elastic Bandages & Supports (FUTURO SHEER SUPPORT HOSE) MISC 1 packet by Other route daily. 1 pair of support hose to wear daily 08/10/14   Rosalita Chessman, DO  fluticasone (FLONASE) 50 MCG/ACT nasal spray Place 2 sprays into both nostrils daily. Patient taking differently: Place 2 sprays into both nostrils daily as needed for allergies.  08/17/14   Rosalita Chessman, DO  furosemide (LASIX) 40 MG tablet Take 1 tablet (40 mg total) by mouth daily. Patient taking differently: Take 40 mg by mouth every other day.  10/13/13 02/09/15  Rosalita Chessman, DO  glucose blood test strip Check blood sugar twice daily 05/25/14   Rosalita Chessman, DO  HYDROcodone-acetaminophen (NORCO) 5-325 MG per tablet Take 1 tablet by mouth every 4 (four) hours as needed. 01/22/15   Junius Creamer, NP  Insulin Pen Needle (NOVOFINE) 32G X 6 MM MISC Inject 1.8 mg into the skin daily. 08/17/14   Rosalita Chessman, DO  Liraglutide (VICTOZA) 18 MG/3ML SOPN Inject 0.3 mLs (1.8 mg total) into the skin daily. 01/22/15   Rosalita Chessman, DO  loratadine (CLARITIN) 10 MG tablet TAKE 1 TABLET BY MOUTH EVERY DAY Patient taking differently: Take 10 mg by mouth daily as needed for allergies.  08/17/14   Rosalita Chessman, DO  pantoprazole (PROTONIX) 40 MG tablet Take 2 tablets (80 mg total) by mouth daily. 08/17/14   Rosalita Chessman, DO  potassium chloride (K-DUR,KLOR-CON) 10 MEQ tablet 2 po qd 08/10/14   Rosalita Chessman, DO  promethazine (PHENERGAN) 12.5 MG tablet Take 2 tablets (25 mg total) by mouth every 6 (six) hours as needed for nausea or vomiting. 01/22/15   Junius Creamer, NP  promethazine (PHENERGAN) 25 MG suppository Place 1 suppository (25 mg total) rectally every 8 (eight) hours as needed for nausea or vomiting. 01/22/15   Junius Creamer, NP   BP 162/81 mmHg  Pulse 70  Temp(Src) 99.2 F (37.3 C) (Oral)  Resp 20  Ht 5\' 6"  (1.676 m)  Wt 338 lb (153.316 kg)  BMI 54.58 kg/m2  SpO2 100% Physical Exam  Constitutional: She is oriented to person, place, and time. She appears well-developed and well-nourished. No distress.  HENT:  Head: Normocephalic and atraumatic.  Mouth/Throat: Oropharynx is clear and moist.  Eyes: Conjunctivae and EOM are normal. Pupils are equal, round, and reactive to light.  Neck: Normal range of motion.  Cardiovascular: Normal rate, regular rhythm and intact distal pulses.   Pulmonary/Chest: Effort normal and breath sounds normal.  Abdominal: Soft. There is no tenderness.  Mild, diffuse tenderness to palpation with no guarding or rebound.  Murphy sign negative,  no tenderness to palpation over McBurney's point, Rovsings, Psoas and obturator all negative.     Musculoskeletal: Normal range of motion.  Neurological: She is alert and oriented to person, place, and time.  Skin: She is not diaphoretic.  Psychiatric: She has a normal mood and affect.  Nursing note and vitals reviewed.   ED Course  Procedures (including critical care time) Labs Review Labs Reviewed  COMPREHENSIVE METABOLIC PANEL - Abnormal; Notable for the following:    Calcium 8.1 (*)    All other components within normal limits  LIPASE, BLOOD - Abnormal; Notable for the following:    Lipase 17 (*)    All other components within normal limits  BODY FLUID CULTURE  CBC WITH DIFFERENTIAL/PLATELET  HCG,  QUANTITATIVE, PREGNANCY  URINALYSIS, ROUTINE W REFLEX MICROSCOPIC (NOT AT [facility])  CEA  CA 125  LACTATE DEHYDROGENASE, BODY FLUID  GLUCOSE, PERITONEAL FLUID  PROTEIN, BODY FLUID  ALBUMIN, FLUID    Imaging Review Ct Abdomen Pelvis W Contrast  01/25/2015   CLINICAL DATA:  Abdominal pain and distention for the past 3 weeks. Followup ascites.  EXAM: CT ABDOMEN AND PELVIS WITH CONTRAST  TECHNIQUE: Multidetector CT imaging of the abdomen and pelvis was performed using the standard protocol following bolus administration of intravenous contrast.  CONTRAST:  35mL OMNIPAQUE IOHEXOL 300 MG/ML SOLN, 179mL OMNIPAQUE IOHEXOL 300 MG/ML SOLN  COMPARISON:  Previous CT and ultrasound examinations, including the abdomen and pelvis CT dated 08/08/2014.  FINDINGS: The examination is somewhat limited by photon starvation due to the large size of the patient and the fact that she was image with her arms by her sides.  Small amount of free peritoneal fluid, with improvement since 08/08/2014.  The liver, spleen, pancreas, gallbladder, adrenal glands, kidneys, urinary bladder and ureters are grossly normal. There is a small bowel loop in the right mid to lower abdomen and upper pelvis with diffuse wall  thickening, with a maximum thickness of 10 mm. Mild adjacent mesenteric soft tissue stranding.  No colonic abnormalities. Mildly prominent retroperitoneal lymph nodes are unchanged, with no pathologically enlarged lymph nodes seen.  Surgically absent uterus. No adnexal masses are seen. Post appendectomy changes. The interstitial markings at the lung bases remain prominent. A small hiatal hernia is again demonstrated.  IMPRESSION: 1. Moderate diffuse wall thickening with mild adjacent mesenteric soft tissue stranding involving a small bowel loop in the right mid lower abdomen and upper pelvis. Differential considerations include infectious and inflammatory enteritis. Ischemic enteritis is less likely but not excluded. Small bowel lymphoma can also have this appearance. 2. Small amount of ascites, with improvement. 3. Small hiatal hernia. 4. Chronic interstitial lung disease or interstitial edema at the lung bases.   Electronically Signed   By: Claudie Revering M.D.   On: 01/25/2015 14:47   I have personally reviewed and evaluated these images and lab results as part of my medical decision-making.   EKG Interpretation None      MDM   Final diagnoses:  Enteritis  Ascites  Emesis, persistent   Filed Vitals:   01/25/15 1222 01/25/15 1438  BP: 168/86 162/81  Pulse: 60 70  Temp: 98.7 F (37.1 C) 99.2 F (37.3 C)  TempSrc: Oral Oral  Resp: 16 20  Height: 5\' 6"  (1.676 m)   Weight: 338 lb (153.316 kg)   SpO2: 100% 100%    Medications  sodium chloride 0.9 % bolus 1,000 mL (1,000 mLs Intravenous New Bag/Given 01/25/15 1500)  morphine 4 MG/ML injection 4 mg (4 mg Intravenous Given 01/25/15 1506)  ciprofloxacin (CIPRO) IVPB 400 mg (400 mg Intravenous New Bag/Given 01/25/15 1524)  metroNIDAZOLE (FLAGYL) IVPB 500 mg (not administered)  ondansetron (ZOFRAN) injection 4 mg (not administered)  HYDROmorphone (DILAUDID) injection 1 mg (not administered)  0.9 %  sodium chloride infusion (not administered)   famotidine (PEPCID) IVPB 20 mg premix (20 mg Intravenous New Bag/Given 01/25/15 1510)  ondansetron (ZOFRAN) injection 4 mg (4 mg Intravenous Given 01/25/15 1503)  iohexol (OMNIPAQUE) 300 MG/ML solution 25 mL (25 mLs Oral Contrast Given 01/25/15 1310)  iohexol (OMNIPAQUE) 300 MG/ML solution 100 mL (100 mLs Intravenous Contrast Given 01/25/15 1433)    Ester P Bost is a pleasant 54 y.o. female presenting with worsening abdominal pain over the course of  the last several days. Patient states she's unable to keep down any solids at home and states that she hasn't eaten in 4 days. Has had ascites of unknown origin and a left-sided ovarian cyst. Discussed with her primary care physician Dr. Margarita Grizzle who requests the patient is admitted to the hospital for expeditious evaluation concern for ovarian cancer.  D/w IR who states may be able to get to jher this afternoon or will put her ion the list for tomorrow  Blood work reassuring with no significant abnormalities. CEA and CA-125 pending. CT abdomen pelvis shows a moderate diffuse wall thickening with soft tissue stranding involving a small bowel loop in the right mid and lower abdomen/upper pelvis. Differentials include inflammatory enteritis ischemic enteritis small bowel lymphoma. Small ascites with interval improvement. Cipro/flagyl given. D/w triad hospitalist Dr. Sloan Leiter who accepts admission to [facility].         Monico Blitz, PA-C 01/25/15 [place], MD 01/27/15 [phone]

## 2015-01-25 NOTE — Progress Notes (Signed)
   Subjective:    Patient ID: Tammy [name], female    [name]: 22-May-1960, 54 y.o.   MRN: [id number]  HPI abd pain- sxs started 1 week ago.  Saw NP on 9/19 and was sent to ER for fluids and evaluation.  Had pelvic US showing free fluid and ascites along w/ L ovarian cyst.  Went to [facility] ER yesterday and had abd [name] that showed some peri-hepatic ascites.  Called GI today and they recommended US guided paracentesis but that is scheduled 4 days from now.  Last CT scan was April.  Pt is in severe pain in office.  Last episode of vomiting was last night.  Pt is having severe pain.  No relief w/ hydrocodone- pt took 2 last night and was up all night.  Has phenergan available at home- no relief.  Pt is not febrile in office but clearly distressed   Review of Systems For ROS see HPI     Objective:   Physical Exam  Constitutional: She is oriented to person, place, and time. She appears well-developed and well-nourished. She appears distressed.  Morbidly obese  HENT:  Head: Normocephalic and atraumatic.  Cardiovascular: Normal rate and regular rhythm.   Pulmonary/Chest: Effort normal and breath sounds normal. No respiratory distress. She has no wheezes. She has no rales.  Abdominal: Soft. There is tenderness (diffuse abdominal tenderness). There is no rebound and no guarding.  Neurological: She is alert and oriented to person, place, and time.  Skin: Skin is warm and dry.  Psychiatric:  tearful  Vitals reviewed.         Assessment & Plan:

## 2015-01-26 ENCOUNTER — Ambulatory Visit (HOSPITAL_COMMUNITY): Payer: 59

## 2015-01-26 ENCOUNTER — Observation Stay (HOSPITAL_COMMUNITY): Payer: 59

## 2015-01-26 ENCOUNTER — Ambulatory Visit: Payer: 59 | Admitting: Family Medicine

## 2015-01-26 DIAGNOSIS — K3184 Gastroparesis: Secondary | ICD-10-CM | POA: Diagnosis present

## 2015-01-26 DIAGNOSIS — E1143 Type 2 diabetes mellitus with diabetic autonomic (poly)neuropathy: Secondary | ICD-10-CM | POA: Diagnosis present

## 2015-01-26 DIAGNOSIS — I10 Essential (primary) hypertension: Secondary | ICD-10-CM | POA: Diagnosis present

## 2015-01-26 DIAGNOSIS — E538 Deficiency of other specified B group vitamins: Secondary | ICD-10-CM | POA: Diagnosis present

## 2015-01-26 DIAGNOSIS — E785 Hyperlipidemia, unspecified: Secondary | ICD-10-CM | POA: Diagnosis present

## 2015-01-26 DIAGNOSIS — T464X5A Adverse effect of angiotensin-converting-enzyme inhibitors, initial encounter: Secondary | ICD-10-CM | POA: Diagnosis present

## 2015-01-26 DIAGNOSIS — Z79899 Other long term (current) drug therapy: Secondary | ICD-10-CM | POA: Diagnosis not present

## 2015-01-26 DIAGNOSIS — K529 Noninfective gastroenteritis and colitis, unspecified: Secondary | ICD-10-CM | POA: Diagnosis not present

## 2015-01-26 DIAGNOSIS — R1115 Cyclical vomiting syndrome unrelated to migraine: Secondary | ICD-10-CM | POA: Diagnosis present

## 2015-01-26 DIAGNOSIS — Z807 Family history of other malignant neoplasms of lymphoid, hematopoietic and related tissues: Secondary | ICD-10-CM | POA: Diagnosis not present

## 2015-01-26 DIAGNOSIS — E876 Hypokalemia: Secondary | ICD-10-CM | POA: Diagnosis present

## 2015-01-26 DIAGNOSIS — K449 Diaphragmatic hernia without obstruction or gangrene: Secondary | ICD-10-CM | POA: Diagnosis present

## 2015-01-26 DIAGNOSIS — R1033 Periumbilical pain: Secondary | ICD-10-CM | POA: Diagnosis not present

## 2015-01-26 DIAGNOSIS — K559 Vascular disorder of intestine, unspecified: Secondary | ICD-10-CM | POA: Diagnosis present

## 2015-01-26 DIAGNOSIS — Z833 Family history of diabetes mellitus: Secondary | ICD-10-CM | POA: Diagnosis not present

## 2015-01-26 DIAGNOSIS — K219 Gastro-esophageal reflux disease without esophagitis: Secondary | ICD-10-CM | POA: Diagnosis present

## 2015-01-26 DIAGNOSIS — Z9102 Food additives allergy status: Secondary | ICD-10-CM | POA: Diagnosis not present

## 2015-01-26 DIAGNOSIS — G8929 Other chronic pain: Secondary | ICD-10-CM | POA: Diagnosis present

## 2015-01-26 DIAGNOSIS — R188 Other ascites: Secondary | ICD-10-CM | POA: Diagnosis present

## 2015-01-26 DIAGNOSIS — Z91013 Allergy to seafood: Secondary | ICD-10-CM | POA: Diagnosis not present

## 2015-01-26 DIAGNOSIS — R1084 Generalized abdominal pain: Secondary | ICD-10-CM

## 2015-01-26 DIAGNOSIS — I872 Venous insufficiency (chronic) (peripheral): Secondary | ICD-10-CM | POA: Diagnosis present

## 2015-01-26 DIAGNOSIS — R109 Unspecified abdominal pain: Secondary | ICD-10-CM | POA: Diagnosis present

## 2015-01-26 DIAGNOSIS — Z6841 Body Mass Index (BMI) 40.0 and over, adult: Secondary | ICD-10-CM | POA: Diagnosis not present

## 2015-01-26 DIAGNOSIS — X58XXXA Exposure to other specified factors, initial encounter: Secondary | ICD-10-CM | POA: Diagnosis present

## 2015-01-26 DIAGNOSIS — Z8249 Family history of ischemic heart disease and other diseases of the circulatory system: Secondary | ICD-10-CM | POA: Diagnosis not present

## 2015-01-26 DIAGNOSIS — T783XXA Angioneurotic edema, initial encounter: Secondary | ICD-10-CM | POA: Diagnosis present

## 2015-01-26 DIAGNOSIS — Z91011 Allergy to milk products: Secondary | ICD-10-CM | POA: Diagnosis not present

## 2015-01-26 DIAGNOSIS — J849 Interstitial pulmonary disease, unspecified: Secondary | ICD-10-CM | POA: Diagnosis present

## 2015-01-26 LAB — GLUCOSE, CAPILLARY
GLUCOSE-CAPILLARY: 77 mg/dL (ref 65–99)
Glucose-Capillary: 87 mg/dL (ref 65–99)
Glucose-Capillary: 72 mg/dL (ref 65–99)

## 2015-01-26 LAB — URINALYSIS, ROUTINE W REFLEX MICROSCOPIC
Bilirubin Urine: NEGATIVE
Glucose, UA: NEGATIVE mg/dL
Hgb urine dipstick: NEGATIVE
Ketones, ur: >80 mg/dL — AB
LEUKOCYTES UA: NEGATIVE
Nitrite: NEGATIVE
PROTEIN: NEGATIVE mg/dL
UROBILINOGEN UA: 1 mg/dL (ref 0.0–1.0)
pH: 6.5 (ref 5.0–8.0)

## 2015-01-26 MED ORDER — FUROSEMIDE 40 MG PO TABS
40.0000 mg | ORAL_TABLET | ORAL | Status: DC
  Administered 2015-01-27 – 2015-01-29 (×2): 40 mg via ORAL
  Filled 2015-01-26 (×2): qty 1

## 2015-01-26 MED ORDER — LIDOCAINE HCL 1 % IJ SOLN
INTRAMUSCULAR | Status: AC
  Filled 2015-01-26: qty 20

## 2015-01-26 MED ORDER — METRONIDAZOLE 250 MG PO TABS
250.0000 mg | ORAL_TABLET | Freq: Three times a day (TID) | ORAL | Status: DC
  Administered 2015-01-26 – 2015-01-29 (×12): 250 mg via ORAL
  Filled 2015-01-26 (×14): qty 1

## 2015-01-26 MED ORDER — LIRAGLUTIDE 18 MG/3ML ~~LOC~~ SOPN: 1.8000 mg | PEN_INJECTOR | Freq: Every day | SUBCUTANEOUS | Status: DC

## 2015-01-26 MED ORDER — BENAZEPRIL HCL 40 MG PO TABS
40.0000 mg | ORAL_TABLET | Freq: Every day | ORAL | Status: DC
  Administered 2015-01-26: 40 mg via ORAL
  Filled 2015-01-26 (×2): qty 1

## 2015-01-26 MED ORDER — CIPROFLOXACIN HCL 500 MG PO TABS
500.0000 mg | ORAL_TABLET | Freq: Two times a day (BID) | ORAL | Status: DC
  Administered 2015-01-26 – 2015-01-29 (×7): 500 mg via ORAL
  Filled 2015-01-26 (×9): qty 1

## 2015-01-26 MED ORDER — ONDANSETRON HCL 4 MG PO TABS
8.0000 mg | ORAL_TABLET | Freq: Three times a day (TID) | ORAL | Status: DC | PRN
  Administered 2015-01-26 (×2): 8 mg via ORAL
  Filled 2015-01-26 (×2): qty 2

## 2015-01-26 MED ORDER — INSULIN ASPART 100 UNIT/ML ~~LOC~~ SOLN
0.0000 [IU] | SUBCUTANEOUS | Status: DC
  Administered 2015-01-29: 1 [IU] via SUBCUTANEOUS

## 2015-01-26 MED ORDER — PANTOPRAZOLE SODIUM 40 MG PO TBEC
80.0000 mg | DELAYED_RELEASE_TABLET | Freq: Every day | ORAL | Status: DC
  Administered 2015-01-26 – 2015-01-29 (×4): 80 mg via ORAL
  Filled 2015-01-26 (×4): qty 2

## 2015-01-26 MED ORDER — SODIUM CHLORIDE 0.9 % IV SOLN
INTRAVENOUS | Status: AC
  Administered 2015-01-26 – 2015-01-27 (×2): via INTRAVENOUS

## 2015-01-26 NOTE — Progress Notes (Signed)
Pt. Assess for PICC insertion.  No suitable veins both arms.  Rt arm basilic vein PICC would occupy 60% of vein; over 080% of cephalic; unable to locate brachial vein.  Lt. Arm both basilic and cephalic veins PICC would occupy over 100%.  Unable to locate Lt. Brachial vein.

## 2015-01-26 NOTE — Progress Notes (Signed)
Paged at [facility] that patient had had small emesis with flecks of blood.  Patient hemodynamically stable, nursing estimated emesis at 50 mL volume.

## 2015-01-26 NOTE — Consult Note (Signed)
Referring Provider: Dr. Algis Liming Primary Care Physician:  Garnet Koyanagi, DO Primary Gastroenterologist:  Dr. Carlean Purl  Reason for Consultation:  Abdominal pain  HPI: Tammy [name] is a 54 y.o. female with past medical history significant for hypertension, type 2 diabetes on Victoza, GERD, morbid obesity, and chronic abdominal pain who presented to [facility] hospital last night with 5 days of severe abdominal pain, nausea, vomiting, and inability to eat or drink since last Saturday.  It appears that she likely has some history of chronic intermittent abdominal pain and vomiting episodes dating back about 9-10 years.  She had an EGD in 2007 by Dr. Carlean Purl for these same complaints and study was normal at that time.  Anyway, on this occasion labs are normal.  VSS.  CT scan of abdomen and pelvis showed the following:  "IMPRESSION: 1. Moderate diffuse wall thickening with mild adjacent mesenteric soft tissue stranding involving a small bowel loop in the right mid lower abdomen and upper pelvis. Differential considerations include infectious and inflammatory enteritis. Ischemic enteritis is less likely but not excluded. Small bowel lymphoma can also have this appearance. 2. Small amount of ascites, with improvement. 3. Small hiatal hernia. 4. Chronic interstitial lung disease or interstitial edema at the lung bases.  She tells me that this episode of symptoms began suddenly 6 days ago and prior to that she was feeling well.  According to Dr. Celesta Aver office note from June she had experienced some severe abdominal pain back in April, but she tells me that resolved and she was feeling normal until 6 days ago.  She tells me that the pain that she has now is different than the pain that she had back in April.  She also reports diarrhea with this episode initially but now no BM since Sunday or Monday; no baseline diarrhea.  Denies bad food exposure.  Telling me that pain is 15/10 at times.  She's had large  volume ascites on and off over the past several months.  ? Source/cause.  Anyway, Dr. Carlean Purl thinks that ascites is causing pain/symptoms.  Also was considered possibility of ACEI angioedema from Benazepril.  Paracentesis was ordered by him yesterday, but she presented to the ED last night and was admitted. Paracentesis with fluid studies was to be done here, but there is not enough fluid to tap at this time.   Awaiting PICC line placement by IR because PICC team could not get a line in.  Last colonoscopy 04/2011 by Dr. Carlean Purl with diverticulosis and one polyp that was removed but not retrieved.  Past Medical History  Diagnosis Date  . GERD (gastroesophageal reflux disease)   . Hypertension   . Hyperlipidemia   . Diabetes mellitus     TYPE 2  . Pneumonia   . Sinusitis   . Acute on chronic appendicitis s/p lap appy 06/10/2012 06/10/2012  . Morbid obesity - BMI > 50 12/13/2012    Past Surgical History  Procedure Laterality Date  . Abdominal hysterectomy    . Knee surgery Right      KNEE SURGERY   . Upper gastrointestinal endoscopy  04/14/2006    Normal  . Colonoscopy  04/11/11    5 mm polyp removed but not recovered  . Laparoscopic appendectomy  06/10/2012    Procedure: APPENDECTOMY LAPAROSCOPIC;  Surgeon: Adin Hector, MD;  Location: [facility];  Service: General;  Laterality: N/A;    Prior to Admission medications   Medication Sig Start Date End Date Taking? Authorizing Provider  benazepril (LOTENSIN) 40 MG tablet Take 1 tablet (40 mg total) by mouth daily. 08/17/14  Yes Rosalita Chessman, DO  Elastic Bandages & Supports (FUTURO SHEER SUPPORT HOSE) MISC 1 packet by Other route daily. 1 pair of support hose to wear daily 08/10/14  Yes Yvonne R Lowne, DO  fluticasone (FLONASE) 50 MCG/ACT nasal spray Place 2 sprays into both nostrils daily. Patient taking differently: Place 2 sprays into both nostrils daily as needed for allergies.  08/17/14  Yes Yvonne R Lowne, DO  furosemide (LASIX) 40 MG  tablet Take 1 tablet (40 mg total) by mouth daily. Patient taking differently: Take 40 mg by mouth every other day.  10/13/13 02/09/15 Yes Alferd Apa Lowne, DO  glucose blood test strip Check blood sugar twice daily 05/25/14  Yes Yvonne R Lowne, DO  Insulin Pen Needle (NOVOFINE) 32G X 6 MM MISC Inject 1.8 mg into the skin daily. 08/17/14  Yes Rosalita Chessman, DO  Liraglutide (VICTOZA) 18 MG/3ML SOPN Inject 0.3 mLs (1.8 mg total) into the skin daily. 01/22/15  Yes Yvonne R Lowne, DO  loratadine (CLARITIN) 10 MG tablet TAKE 1 TABLET BY MOUTH EVERY DAY Patient taking differently: Take 10 mg by mouth daily as needed for allergies.  08/17/14  Yes Yvonne R Lowne, DO  pantoprazole (PROTONIX) 40 MG tablet Take 2 tablets (80 mg total) by mouth daily. 08/17/14  Yes Yvonne R Lowne, DO  potassium chloride (K-DUR,KLOR-CON) 10 MEQ tablet 2 po qd Patient taking differently: Take 20 mEq by mouth daily. 2 po qd 08/10/14  Yes Rosalita Chessman, DO    Current Facility-Administered Medications  Medication Dose Route Frequency Provider Last Rate Last Dose  . 0.9 %  sodium chloride infusion   Intravenous STAT Nicole Pisciotta, PA-C      . benazepril (LOTENSIN) tablet 40 mg  40 mg Oral Daily Edwin Dada, MD   40 mg at 01/26/15 0941  . ciprofloxacin (CIPRO) tablet 500 mg  500 mg Oral BID Edwin Dada, MD   500 mg at 01/26/15 0854  . [START ON 01/27/2015] furosemide (LASIX) tablet 40 mg  40 mg Oral QODAY Christopher Shirleen Schirmer, MD      . HYDROmorphone HCl (DILAUDID) liquid 1 mg  1 mg Oral Q2H PRN Edwin Dada, MD   1 mg at 01/26/15 0854  . Liraglutide SOPN 1.8 mg  1.8 mg Subcutaneous Daily Edwin Dada, MD   1.8 mg at 01/26/15 1027  . metroNIDAZOLE (FLAGYL) tablet 250 mg  250 mg Oral 3 times per day Edwin Dada, MD   250 mg at 01/26/15 0558  . ondansetron (ZOFRAN) tablet 8 mg  8 mg Oral Q8H PRN Edwin Dada, MD   8 mg at 01/26/15 0940  . pantoprazole (PROTONIX) EC tablet 80 mg  80  mg Oral Daily Edwin Dada, MD   80 mg at 01/26/15 0941    Allergies as of 01/25/2015 - Review Complete 01/25/2015  Allergen Reaction Noted  . Cheese Nausea And Vomiting 10/13/2013  . Red dye Nausea And Vomiting 03/23/2012  . [name] Flash [fish allergy] Nausea And Vomiting 03/23/2012    Family History  Problem Relation Age of Onset  . Hyperlipidemia Mother   . Hypertension Mother   . Cancer Mother 24    hodgkins lymphoma  . Diabetes Father   . Hyperlipidemia Brother   . Colon cancer Neg Hx   . Heart attack Neg Hx   . Sudden death Neg Hx   .  Diabetes Paternal Grandmother     Social History   Social History  . Marital Status: Married    Spouse Name: N/A  . Number of Children: 0  . Years of Education: N/A   Occupational History  . [employer] technician [employer]   Social History Main Topics  . Smoking status: Never Smoker   . Smokeless tobacco: Never Used  . Alcohol Use: No  . Drug Use: No  . Sexual Activity:    Partners: Male   Other Topics Concern  . Not on file   Social History Narrative    Review of Systems: Ten point ROS is O/W negative except as mentioned in HPI.  Physical Exam: Vital signs in last 24 hours: Temp:  [98.2 F (36.8 C)-99.2 F (37.3 C)] 98.2 F (36.8 C) (09/23 0511) Pulse Rate:  [53-86] 86 (09/23 0511) Resp:  [16-20] 18 (09/23 0511) BP: (159-172)/(67-96) 163/70 mmHg (09/23 0511) SpO2:  [97 %-100 %] 98 % (09/23 0511) Weight:  [338 lb (153.316 kg)] 338 lb (153.316 kg) (09/22 1545) Last BM Date: 01/21/15 General:  Alert, Well-developed, well-nourished, cooperative in NAD Head:  Normocephalic and atraumatic. Eyes:  Sclera clear, no icterus.  Conjunctiva pink. Ears:  Normal auditory acuity. Mouth:  No deformity or lesions.   Lungs:  Clear throughout to auscultation.  No wheezes, crackles, or rhonchi.  Heart:  Regular rate and rhythm Abdomen:  Soft, non-distended.  BS present.  Diffuse TTP but abdominal exam benign. Rectal:   Deferred  Msk:  Symmetrical without gross deformities. Pulses:  Normal pulses noted. Extremities:  Pitting edema noted in B/L LE's. Neurologic:  Alert and  oriented x4;  grossly normal neurologically. Skin:  Intact without significant lesions or rashes Psych:  Alert and cooperative. Normal mood and affect.  Intake/Output from previous day: 09/22 0701 - 09/23 0700 In: 50 [I.V.:50] Out: 150 [Urine:150]  Lab Results:  Recent Labs  01/25/15 1330  WBC 5.6  HGB 14.1  HCT 42.3  PLT 205   BMET  Recent Labs  01/25/15 1330  NA 137  K 3.5  CL 104  CO2 23  GLUCOSE 96  BUN 11  CREATININE 0.84  CALCIUM 8.1*   LFT  Recent Labs  01/25/15 1330  PROT 7.2  ALBUMIN 3.7  AST 22  ALT 15  ALKPHOS 58  BILITOT 1.0    Studies/Results: Ct Abdomen Pelvis W Contrast  01/25/2015   CLINICAL DATA:  Abdominal pain and distention for the past 3 weeks. Followup ascites.  EXAM: CT ABDOMEN AND PELVIS WITH CONTRAST  TECHNIQUE: Multidetector CT imaging of the abdomen and pelvis was performed using the standard protocol following bolus administration of intravenous contrast.  CONTRAST:  22mL OMNIPAQUE IOHEXOL 300 MG/ML SOLN, 170mL OMNIPAQUE IOHEXOL 300 MG/ML SOLN  COMPARISON:  Previous CT and ultrasound examinations, including the abdomen and pelvis CT dated 08/08/2014.  FINDINGS: The examination is somewhat limited by photon starvation due to the large size of the patient and the fact that she was image with her arms by her sides.  Small amount of free peritoneal fluid, with improvement since 08/08/2014.  The liver, spleen, pancreas, gallbladder, adrenal glands, kidneys, urinary bladder and ureters are grossly normal. There is a small bowel loop in the right mid to lower abdomen and upper pelvis with diffuse wall thickening, with a maximum thickness of 10 mm. Mild adjacent mesenteric soft tissue stranding.  No colonic abnormalities. Mildly prominent retroperitoneal lymph nodes are unchanged, with no  pathologically enlarged lymph nodes  seen.  Surgically absent uterus. No adnexal masses are seen. Post appendectomy changes. The interstitial markings at the lung bases remain prominent. A small hiatal hernia is again demonstrated.  IMPRESSION: 1. Moderate diffuse wall thickening with mild adjacent mesenteric soft tissue stranding involving a small bowel loop in the right mid lower abdomen and upper pelvis. Differential considerations include infectious and inflammatory enteritis. Ischemic enteritis is less likely but not excluded. Small bowel lymphoma can also have this appearance. 2. Small amount of ascites, with improvement. 3. Small hiatal hernia. 4. Chronic interstitial lung disease or interstitial edema at the lung bases.   Electronically Signed   By: Claudie Revering M.D.   On: 01/25/2015 14:47   Dg Chest Port 1 View  01/26/2015   CLINICAL DATA:  PICC line placement.  EXAM: PORTABLE CHEST 1 VIEW  COMPARISON:  May 09, 2007.  FINDINGS: The heart size and mediastinal contours are within normal limits. Both lungs are clear. No pneumothorax or pleural effusion is noted. No PICC line is noted. The visualized skeletal structures are unremarkable.  IMPRESSION: No acute cardiopulmonary abnormality seen.   Electronically Signed   By: Marijo Conception, M.D.   On: 01/26/2015 09:29    IMPRESSION:  -54 year old female with chronic, intermittent abdominal pain but episodes of worsening pain accompanied by nausea/vomiting.  Large volume ascites of unknown source on and off with multiple imaging studies.  There has been suggestion of cirrhosis by at least one imaging study and she does have the ascites along with some splenomegaly, but no definite liver disease documented and synthetic function is preserved.  ? If pain is related to ascites vs ACEI angioedema.  CT scan showing some moderate diffuse small bowel wall thickening on this occasion.  ? Infectious source vs other etiology including ACEI angioedema.      PLAN: -Will discuss with Dr. Hilarie Fredrickson.  ZEHR, JESSICA D.  01/26/2015, 11:21 AM  Pager number [phone]

## 2015-01-26 NOTE — Progress Notes (Addendum)
PROGRESS NOTE    Tammy [name] QPR:[id number] [name]: [birth year] DOA: 01/25/2015 PCP: Garnet Koyanagi, DO  GI: Dr. Silvano Rusk  HPI/Brief narrative 54 y.o. female with a past medical history significant for hypertension, type 2 diabetes on Victoza, GERD, and chronic intermittent abdominal pain who presents with 5 days severe, colicky abdominal pain, vomiting, and inability to take by mouth. No fever, diarrhea reported. No recent travel or antibiotic exposure. Since the beginning of this episode the patient has been to the ER at [facility], the [place], and her PCPs office twice, without relief of pain or nausea. Of note the patient had called Dr. Celesta Aver office who scheduled her for a paracentesis this coming Monday (history of large volume ascites of unknown source on and off on multiple imaging studies which also was thought to be the source of her abdominal pain). In the ED, CT abdomen showed new focal segment of small bowel thickening and some ascites. Admitted for further evaluation and management.     Assessment/Plan:  Recurrent/chronic abdominal pain, nausea & vomiting - Patient has long history of chronic intermittent abdominal pain, nausea, vomiting and ascites by imaging studies. No diarrhea reported. Last BM 3 days ago. No cyclic contacts, eating anything unusual or antibiotic exposure. - As per review of records, normal EGD, normal small bowel follow-through. - Patient apparently had been referred to GYN/Dr. Glo Herring where pelvic ultrasound showed ovarian cyst which was not thought to be a cause of her symptoms. - DD: Acute viral GE, diabetes related gastroparesis, related to ascites of unclear etiology, bowel angioedema related to use of ACEI - IR consulted for diagnostic paracentesis - Bowel rest, IV fluids, supportive treatment and empirically started on antibiotics (Cipro and send Flagyl)  Essential hypertension - Mild uncontrolled. Likely secondary to pain. For now  continue benazepril.  Type II DM, controlled - Hold oral medications and treat with SSI.  Morbid obesity  Poor IV access - Nursing unable to get PIV. IV team unable to get a PICC line and recommend PICC line per IR-consulted    DVT prophylaxis: SCDs Code Status: Full Family Communication: None at bedside Disposition Plan: DC home when medically stable   Consultants:  [facility]  Interventional radiology  Procedures:  None  Antibiotics:  Oral Cipro 9/22>  Oral Flagyl 9/22>   Subjective: Diffuse moderate intermittent abdominal pain. Nonbloody emesis 1 this morning (as per report-small emesis with flecks of blood). Last BM 3 days ago.  Objective: Filed Vitals:   01/25/15 1558 01/25/15 1800 01/25/15 2146 01/26/15 0511  BP: 159/67 160/96 172/74 163/70  Pulse: 61 56 53 86  Temp:  98.3 F (36.8 C) 99 F (37.2 C) 98.2 F (36.8 C)  TempSrc:  Oral Oral Oral  Resp:  18 19 18   Height:      Weight:      SpO2: 98% 99% 99% 98%    Intake/Output Summary (Last 24 hours) at 01/26/15 1348 Last data filed at 01/26/15 1318  Gross per 24 hour  Intake     50 ml  Output    350 ml  Net   -300 ml   Filed Weights   01/25/15 1222 01/25/15 1545  Weight: 153.316 kg (338 lb) 153.316 kg (338 lb)     Exam:  General exam: Moderately built and morbidly obese female patient lying comfortably supine in bed. Respiratory system: Clear. No increased work of breathing. Cardiovascular system: S1 & S2 heard, RRR. No JVD, murmurs, gallops, clicks or pedal edema.  Gastrointestinal system: Abdomen is nondistended, soft diffuse mild tenderness without peritoneal signs. Normal bowel sounds heard. Central nervous system: Alert and oriented. No focal neurological deficits. Extremities: Symmetric 5 x 5 power.   Data Reviewed: Basic Metabolic Panel:  Recent Labs Lab 01/22/15 1616 01/25/15 1330  NA 137 137  K 3.9 3.5  CL 105 104  CO2 21* 23  GLUCOSE 119* 96  BUN 11 11  CREATININE  0.85 0.84  CALCIUM 8.4* 8.1*   Liver Function Tests:  Recent Labs Lab 01/22/15 1616 01/25/15 1330  AST 21 22  ALT 18 15  ALKPHOS 66 58  BILITOT 0.9 1.0  PROT 7.7 7.2  ALBUMIN 4.1 3.7    Recent Labs Lab 01/22/15 1616 01/25/15 1330  LIPASE 23 17*   No results for input(s): AMMONIA in the last 168 hours. CBC:  Recent Labs Lab 01/22/15 1616 01/25/15 1330  WBC 6.2 5.6  NEUTROABS  --  4.1  HGB 14.1 14.1  HCT 42.5 42.3  MCV 87.4 85.3  PLT 279 205   Cardiac Enzymes: No results for input(s): CKTOTAL, CKMB, CKMBINDEX, TROPONINI in the last 168 hours. BNP (last 3 results) No results for input(s): PROBNP in the last 8760 hours. CBG:  Recent Labs Lab 01/26/15 1200  GLUCAP 77    No results found for this or any previous visit (from the past 240 hour(s)).        Studies: Ct Abdomen Pelvis W Contrast  01/25/2015   CLINICAL DATA:  Abdominal pain and distention for the past 3 weeks. Followup ascites.  EXAM: CT ABDOMEN AND PELVIS WITH CONTRAST  TECHNIQUE: Multidetector CT imaging of the abdomen and pelvis was performed using the standard protocol following bolus administration of intravenous contrast.  CONTRAST:  23mL OMNIPAQUE IOHEXOL 300 MG/ML SOLN, 167mL OMNIPAQUE IOHEXOL 300 MG/ML SOLN  COMPARISON:  Previous CT and ultrasound examinations, including the abdomen and pelvis CT dated 08/08/2014.  FINDINGS: The examination is somewhat limited by photon starvation due to the large size of the patient and the fact that she was image with her arms by her sides.  Small amount of free peritoneal fluid, with improvement since 08/08/2014.  The liver, spleen, pancreas, gallbladder, adrenal glands, kidneys, urinary bladder and ureters are grossly normal. There is a small bowel loop in the right mid to lower abdomen and upper pelvis with diffuse wall thickening, with a maximum thickness of 10 mm. Mild adjacent mesenteric soft tissue stranding.  No colonic abnormalities. Mildly prominent  retroperitoneal lymph nodes are unchanged, with no pathologically enlarged lymph nodes seen.  Surgically absent uterus. No adnexal masses are seen. Post appendectomy changes. The interstitial markings at the lung bases remain prominent. A small hiatal hernia is again demonstrated.  IMPRESSION: 1. Moderate diffuse wall thickening with mild adjacent mesenteric soft tissue stranding involving a small bowel loop in the right mid lower abdomen and upper pelvis. Differential considerations include infectious and inflammatory enteritis. Ischemic enteritis is less likely but not excluded. Small bowel lymphoma can also have this appearance. 2. Small amount of ascites, with improvement. 3. Small hiatal hernia. 4. Chronic interstitial lung disease or interstitial edema at the lung bases.   Electronically Signed   By: Claudie Revering M.D.   On: 01/25/2015 14:47   Dg Chest Port 1 View  01/26/2015   CLINICAL DATA:  PICC line placement.  EXAM: PORTABLE CHEST 1 VIEW  COMPARISON:  May 09, 2007.  FINDINGS: The heart size and mediastinal contours are within normal limits. Both lungs are  clear. No pneumothorax or pleural effusion is noted. No PICC line is noted. The visualized skeletal structures are unremarkable.  IMPRESSION: No acute cardiopulmonary abnormality seen.   Electronically Signed   By: Marijo Conception, M.D.   On: 01/26/2015 09:29        Scheduled Meds: . sodium chloride   Intravenous STAT  . benazepril  40 mg Oral Daily  . ciprofloxacin  500 mg Oral BID  . [START ON 01/27/2015] furosemide  40 mg Oral QODAY  . insulin aspart  0-9 Units Subcutaneous 6 times per day  . Liraglutide  1.8 mg Subcutaneous Daily  . metroNIDAZOLE  250 mg Oral 3 times per day  . pantoprazole  80 mg Oral Daily   Continuous Infusions:   Principal Problem:   Enteritis Active Problems:   Type 2 diabetes mellitus   Essential hypertension   Chronic vomiting   Ascites   Emesis, persistent    Time spent: 35  minutes.    Vernell Leep, MD, FACP, FHM. [facility] Pager [phone]  If 7PM-7AM, please contact night-coverage [email or web address] Password [email or web address] 01/26/2015, 1:48 PM    LOS: 1 day

## 2015-01-26 NOTE — Procedures (Signed)
Successful placement of left brachial vein approach dual lumen PICC line with tip at the superior caval-atrial junction.   The PICC line is ready for immediate use.  Ronny Bacon, MD Pager #: [phone]

## 2015-01-26 NOTE — Progress Notes (Signed)
Patient ID: Tammy [name], female   [name]: Apr 15, 1961, 54 y.o.   MRN: [id number] Patient presented to ultrasound department today for paracentesis. On limited ultrasound of abdomen in all 4 quadrants there is no significant ascites to safely aspirate at this time. Procedure was canceled. Patient informed.

## 2015-01-27 ENCOUNTER — Inpatient Hospital Stay (HOSPITAL_COMMUNITY): Payer: 59

## 2015-01-27 LAB — GLUCOSE, CAPILLARY
GLUCOSE-CAPILLARY: 66 mg/dL (ref 65–99)
GLUCOSE-CAPILLARY: 81 mg/dL (ref 65–99)
GLUCOSE-CAPILLARY: 83 mg/dL (ref 65–99)
GLUCOSE-CAPILLARY: 85 mg/dL (ref 65–99)
GLUCOSE-CAPILLARY: 86 mg/dL (ref 65–99)
GLUCOSE-CAPILLARY: 95 mg/dL (ref 65–99)
Glucose-Capillary: 81 mg/dL (ref 65–99)

## 2015-01-27 MED ORDER — SODIUM CHLORIDE 0.9 % IJ SOLN
10.0000 mL | INTRAMUSCULAR | Status: DC | PRN
  Administered 2015-01-27 – 2015-01-29 (×2): 10 mL
  Filled 2015-01-27 (×2): qty 40

## 2015-01-27 MED ORDER — BARIUM SULFATE 0.1 % PO SUSP
450.0000 mL | Freq: Once | ORAL | Status: AC
  Administered 2015-01-27: 450 mL via ORAL

## 2015-01-27 MED ORDER — IOHEXOL 300 MG/ML  SOLN
100.0000 mL | Freq: Once | INTRAMUSCULAR | Status: AC | PRN
  Administered 2015-01-27: 100 mL via INTRAVENOUS

## 2015-01-27 MED ORDER — SIMETHICONE 80 MG PO CHEW
80.0000 mg | CHEWABLE_TABLET | Freq: Four times a day (QID) | ORAL | Status: DC | PRN
  Administered 2015-01-27 – 2015-01-28 (×3): 80 mg via ORAL
  Filled 2015-01-27 (×4): qty 1

## 2015-01-27 MED ORDER — ENOXAPARIN SODIUM 80 MG/0.8ML ~~LOC~~ SOLN
75.0000 mg | SUBCUTANEOUS | Status: DC
  Administered 2015-01-27 – 2015-01-29 (×3): 75 mg via SUBCUTANEOUS
  Filled 2015-01-27 (×3): qty 0.8

## 2015-01-27 MED ORDER — AMLODIPINE BESYLATE 10 MG PO TABS
10.0000 mg | ORAL_TABLET | Freq: Every day | ORAL | Status: DC
  Administered 2015-01-27 – 2015-01-29 (×3): 10 mg via ORAL
  Filled 2015-01-27 (×3): qty 1

## 2015-01-27 MED ORDER — ACETAMINOPHEN 325 MG PO TABS
650.0000 mg | ORAL_TABLET | Freq: Four times a day (QID) | ORAL | Status: DC | PRN
  Administered 2015-01-28: 650 mg via ORAL
  Filled 2015-01-27 (×2): qty 2

## 2015-01-27 MED ORDER — SODIUM CHLORIDE 0.9 % IV SOLN
INTRAVENOUS | Status: AC
  Administered 2015-01-27: 21:00:00 via INTRAVENOUS

## 2015-01-27 MED ORDER — HYDROMORPHONE HCL 1 MG/ML PO LIQD: 1.0000 mg | ORAL | Status: DC | PRN

## 2015-01-27 MED ORDER — HYDROMORPHONE HCL 1 MG/ML PO LIQD: 0.5000 mg | ORAL | Status: DC | PRN

## 2015-01-27 MED ORDER — ENOXAPARIN SODIUM 80 MG/0.8ML ~~LOC~~ SOLN
75.0000 mg | SUBCUTANEOUS | Status: DC
  Filled 2015-01-27: qty 0.8

## 2015-01-27 NOTE — Progress Notes (Signed)
Cross cover [facility] Subjective: Patient seen and examined; chart reviewed. She continues to complain off sharp pain and does NOT appreciate everyone "mashing on her stomach 90 times as this makes the pain unbearable". She claims this has been going on for the last 10 days. She reportedly had a similar problem in April this year that lasted for 10 days days but seemed to resolve on its own. She claims at times the pain comes in waves and can be excruciating.There are no specific relieving or aggravating factors. CTE shows a segment of small bowel thickening ?inflammatory vs infectious. She is on Ciprofloxacin and Flagyl.  Objective: Vital signs in last 24 hours: Temp:  [98.2 F (36.8 C)-98.8 F (37.1 C)] 98.2 F (36.8 C) (09/24 1437) Pulse Rate:  [53-76] 76 (09/24 1437) Resp:  [18] 18 (09/24 1437) BP: (130-150)/(47-70) 130/47 mmHg (09/24 1437) SpO2:  [97 %-100 %] 99 % (09/24 1437) Last BM Date: 01/22/15  Intake/Output from previous day: 09/23 0701 - 09/24 0700 In: 1065 [I.V.:1065] Out: 600 [Urine:600] Intake/Output this shift: Total I/O In: 373.8 [I.V.:373.8] Out: 1200 [Urine:1200]  General appearance: alert, cooperative, appears stated age, mild distress and morbidly obese Resp: clear to auscultation bilaterally Cardio: regular rate and rhythm, S1, S2 normal, no murmur, click, rub or gallop GI: soft, morbidly obese with diffuse abdominal tenderness on palpation with gaurdng; bowel sounds normal; no masses,  no organomegaly  Lab Results:  Recent Labs  01/25/15 1330  WBC 5.6  HGB 14.1  HCT 42.3  PLT 205   BMET  Recent Labs  01/25/15 1330  NA 137  K 3.5  CL 104  CO2 23  GLUCOSE 96  BUN 11  CREATININE 0.84  CALCIUM 8.1*   LFT  Recent Labs  01/25/15 1330  PROT 7.2  ALBUMIN 3.7  AST 22  ALT 15  ALKPHOS 58  BILITOT 1.0   PStudies/Results: US Abdomen Limited  01/26/2015   CLINICAL DATA:  Evaluate ascites for paracentesis  EXAM: LIMITED ABDOMEN ULTRASOUND FOR  ASCITES  TECHNIQUE: Limited ultrasound survey for ascites was performed in all four abdominal quadrants.  COMPARISON:  01/25/2015  FINDINGS: Four quadrant survey of the abdomen was performed demonstrating a small volume of ascites within the right side of pelvis. There is insufficient volume of ascites for paracentesis.  IMPRESSION: 1. Small volume of ascites identified within the right side of pelvis.   Electronically Signed   By: Kerby Moors M.D.   On: 01/26/2015 16:24   Ir Fluoro Guide Cv Line Left  01/26/2015   INDICATION: Poor venous access. In need of intravenous access for medication administration and blood draws.  EXAM: ULTRASOUND AND FLUOROSCOPIC GUIDED PICC LINE INSERTION  MEDICATIONS: None.  CONTRAST:  None  FLUOROSCOPY TIME:  12 seconds.  COMPLICATIONS: None immediate  TECHNIQUE: The procedure, risks, benefits, and alternatives were explained to the patient and informed written consent was obtained. A timeout was performed prior to the initiation of the procedure.  The left upper extremity was prepped with chlorhexidine in a sterile fashion, and a sterile drape was applied covering the operative field. Maximum barrier sterile technique with sterile gowns and gloves were used for the procedure. A timeout was performed prior to the initiation of the procedure. Local anesthesia was provided with 1% lidocaine.  Under direct ultrasound guidance, the left brachia vein was accessed with a micropuncture kit after the overlying soft tissues were anesthetized with 1% lidocaine. An ultrasound image was saved for documentation purposes. A guidewire was advanced to  the level of the superior caval-atrial junction for measurement purposes and the PICC line was cut to length. A peel-away sheath was placed and a 52 cm, 5 [place], dual lumen was inserted to level of the superior caval-atrial junction. A post procedure spot fluoroscopic was obtained. The catheter easily aspirated and flushed and was sutured in place.  A dressing was placed. The patient tolerated the procedure well without immediate post procedural complication.  FINDINGS: After catheter placement, the tip lies within the superior cavoatrial junction. The catheter aspirates and flushes normally and is ready for immediate use.  IMPRESSION: Successful ultrasound and fluoroscopic guided placement of a left brachial vein approach, 5 French, dual lumen PICC with tip at the superior caval-atrial junction. The PICC line is ready for immediate use.   Electronically Signed   By: Sandi Mariscal M.D.   On: 01/26/2015 17:45   Ir US Guide Vasc Access Right  01/26/2015   INDICATION: Poor venous access. In need of intravenous access for medication administration and blood draws.  EXAM: ULTRASOUND AND FLUOROSCOPIC GUIDED PICC LINE INSERTION  MEDICATIONS: None.  CONTRAST:  None  FLUOROSCOPY TIME:  12 seconds.  COMPLICATIONS: None immediate  TECHNIQUE: The procedure, risks, benefits, and alternatives were explained to the patient and informed written consent was obtained. A timeout was performed prior to the initiation of the procedure.  The left upper extremity was prepped with chlorhexidine in a sterile fashion, and a sterile drape was applied covering the operative field. Maximum barrier sterile technique with sterile gowns and gloves were used for the procedure. A timeout was performed prior to the initiation of the procedure. Local anesthesia was provided with 1% lidocaine.  Under direct ultrasound guidance, the left brachia vein was accessed with a micropuncture kit after the overlying soft tissues were anesthetized with 1% lidocaine. An ultrasound image was saved for documentation purposes. A guidewire was advanced to the level of the superior caval-atrial junction for measurement purposes and the PICC line was cut to length. A peel-away sheath was placed and a 52 cm, 5 [place], dual lumen was inserted to level of the superior caval-atrial junction. A post procedure spot  fluoroscopic was obtained. The catheter easily aspirated and flushed and was sutured in place. A dressing was placed. The patient tolerated the procedure well without immediate post procedural complication.  FINDINGS: After catheter placement, the tip lies within the superior cavoatrial junction. The catheter aspirates and flushes normally and is ready for immediate use.  IMPRESSION: Successful ultrasound and fluoroscopic guided placement of a left brachial vein approach, 5 French, dual lumen PICC with tip at the superior caval-atrial junction. The PICC line is ready for immediate use.   Electronically Signed   By: Sandi Mariscal M.D.   On: 01/26/2015 17:45   Ct Entero Abd/pelvis W/cm  01/27/2015   CLINICAL DATA:  Patient with worsening abdominal pain. Prior abnormal CT of the abdomen.  EXAM: CT ABDOMEN AND PELVIS WITH CONTRAST (ENTEROGRAPHY)  TECHNIQUE: Multidetector CT of the abdomen and pelvis during bolus administration of intravenous contrast. Negative oral contrast VoLumen was given.  CONTRAST:  100 cc Omnipaque 300  COMPARISON:  CT abdomen pelvis 01/25/2015  FINDINGS: Lower chest: Normal heart size. Dependent atelectasis within the bilateral lower lobes.  Hepatobiliary: The liver is normal in size and contour. No focal hepatic lesion. Probable small amount of sludge within the gallbladder lumen. No intrahepatic or extrahepatic biliary ductal dilatation.  Pancreas: Unremarkable  Spleen: Multiple calcified granulomata in the spleen.  Adrenals/Urinary Tract:  Adrenal glands are normal. Kidneys enhance symmetrically with contrast. Urinary bladder is unremarkable.  Stomach/Bowel: There is persistent circumferential wall thickening of the mid and distal small bowel, predominantly within the central abdomen. Persistent mesenteric free fluid. No free intraperitoneal air. Oral contrast material is demonstrated within the colon.  Vascular/Lymphatic: Normal caliber abdominal aorta. No retroperitoneal lymphadenopathy.  Interval increase in free fluid within the pelvis. Persistent perihepatic free fluid.  Other: None  Musculoskeletal: Lower thoracic and lumbar spine degenerative changes. No aggressive or acute appearing osseous lesions.  IMPRESSION: Grossly unchanged circumferential wall thickening of the mid small bowel within the central abdomen with associated mesenteric and pelvic free fluid. Findings are favored to represent a severe infectious or possibly inflammatory enteritis given appearance over time. Ischemic etiology is not favored however not entirely excluded.  Interval increase in free fluid predominately within the pelvis.   Electronically Signed   By: Lovey Newcomer M.D.   On: 01/27/2015 12:31   Dg Chest Port 1 View  01/26/2015   CLINICAL DATA:  PICC line placement.  EXAM: PORTABLE CHEST 1 VIEW  COMPARISON:  May 09, 2007.  FINDINGS: The heart size and mediastinal contours are within normal limits. Both lungs are clear. No pneumothorax or pleural effusion is noted. No PICC line is noted. The visualized skeletal structures are unremarkable.  IMPRESSION: No acute cardiopulmonary abnormality seen.   Electronically Signed   By: Marijo Conception, M.D.   On: 01/26/2015 09:29   Medications: I have reviewed the patient's current medications.  Assessment/Plan: 1) Abdominal pain-?Enteritis on CTE; treat with antibiotics for a few days and start a full liquid diet.  2) GERD: On Pantoprazole. 3) Morbid obesity.  4) HTN/AODM.  LOS: 2 days   MANN,JYOTHI 01/27/2015, 3:02 PM

## 2015-01-27 NOTE — Progress Notes (Signed)
ANTICOAGULATION CONSULT NOTE - Initial Consult  Pharmacy Consult for Lovenox Indication: VTE prophylaxis  Patient Measurements: Height: 5\' 6"  (167.6 cm) Weight: (!) 338 lb (153.316 kg) IBW/kg (Calculated) : 59.3  Labs:  Recent Labs  01/25/15 1330  HGB 14.1  HCT 42.3  PLT 205  CREATININE 0.84    Estimated Creatinine Clearance: 118.5 mL/min (by C-G formula based on Cr of 0.84).  Assessment: 54 yo female admitted 9/22 with abdominal pain.  Patient is morbidly obese, so Pharmacy is consulted to dose lovenox for VTE prophylaxis.  Goal of Therapy:  Prevention of VTE   Plan:   Lovenox 75mg  SQ q24h  Further dosage adjustment appears unlikely at present.  Will sign off at this time.  Please reconsult if a change in clinical status warrants re-evaluation of dosage.  Peggyann Juba, PharmD, BCPS Pager: [phone] 01/27/2015 8:42 AM

## 2015-01-27 NOTE — Progress Notes (Addendum)
PROGRESS NOTE    Tammy [name] VAN:[id number] [name]: 1960/07/14 DOA: 01/25/2015 PCP: Garnet Koyanagi, DO  GI: Dr. Silvano Rusk  HPI/Brief narrative 54 y.o. female with a past medical history significant for hypertension, type 2 diabetes on Victoza, GERD, and chronic intermittent abdominal pain who presents with 5 days severe, colicky abdominal pain, vomiting, and inability to take by mouth. No fever, diarrhea reported. No recent travel or antibiotic exposure. Since the beginning of this episode the patient has been to the ER at [facility], the [place], and her PCPs office twice, without relief of pain or nausea. Of note the patient had called Dr. Celesta Aver office who scheduled her for a paracentesis this coming Monday (history of large volume ascites of unknown source on and off on multiple imaging studies which also was thought to be the source of her abdominal pain). In the ED, CT abdomen showed new focal segment of small bowel thickening and some ascites. Admitted for further evaluation and management.   Assessment/Plan:  Recurrent/chronic abdominal pain, nausea & vomiting - Patient has long history of chronic intermittent abdominal pain, nausea, vomiting and ascites by imaging studies. No diarrhea reported. Last BM 4 days ago. No sickly contacts, eating anything unusual or antibiotic exposure. - As per review of records, normal EGD, normal small bowel follow-through. - Patient apparently had been referred to GYN/Dr. Glo Herring where pelvic ultrasound showed ovarian cyst which was not thought to be a cause of her symptoms. - DD: Acute viral GE, diabetes related gastroparesis, related to ascites of unclear etiology, bowel angioedema related to use of ACEI - IR consulted for diagnostic paracentesis - not enough fluid to tap. - Bowel rest, IV fluids, supportive treatment and empirically started on antibiotics (Cipro and send Flagyl) - GI input appreciated: CT enterography requested: Shows  circumferential wall thickening of the mid small bowel within the central abdomen with associated mesenteric and pelvic free fluid. Suspecting infectious versus inflammatory etiology. Await GI follow-up-? Capsule endoscopy next - States no change in symptoms.  Essential hypertension - Mild uncontrolled. Likely secondary to pain. Benazepril discontinued 9/24 due to concern for angioedema as cause of her bowel symptoms. Started amlodipine 9/24.  Type II DM, controlled - Hold oral medications and treat with SSI.  Morbid obesity  Poor IV access - Status post PICC line placed by IR on 9/23    DVT prophylaxis: SCDs Code Status: Full Family Communication: Discussed with spouse at bedside Disposition Plan: DC home when medically stable   Consultants:  [facility]  Interventional radiology  Procedures:  PICC line 9/23  Antibiotics:  Oral Cipro 9/22>  Oral Flagyl 9/22>   Subjective: No change in diffuse intermittent moderate abdominal pain. No BM. Feels gaseous and asking for "gas medicine". No nausea or vomiting.  Objective: Filed Vitals:   01/26/15 1359 01/26/15 2151 01/27/15 0547 01/27/15 1437  BP: 174/87 150/70 134/61 130/47  Pulse: 66 53 70 76  Temp: 98.8 F (37.1 C) 98.8 F (37.1 C) 98.5 F (36.9 C) 98.2 F (36.8 C)  TempSrc: Oral Oral Oral Oral  Resp: _0 Height:      Weight:      SpO2: 100% 100% 97% 99%    Intake/Output Summary (Last 24 hours) at 01/27/15 1444 Last data filed at 01/27/15 1438  Gross per 24 hour  Intake 1438.75 ml  Output   1600 ml  Net -161.25 ml   Filed Weights   01/25/15 1222 01/25/15 1545  Weight: 153.316 kg (  338 lb) 153.316 kg (338 lb)     Exam:  General exam: Moderately built and morbidly obese female patient lying comfortably supine in bed. Respiratory system: Clear. No increased work of breathing. Cardiovascular system: S1 & S2 heard, RRR. No JVD, murmurs, gallops, clicks or pedal edema. Gastrointestinal system:  Abdomen is nondistended, soft diffuse mild tenderness without peritoneal signs. Normal bowel sounds heard. Central nervous system: Alert and oriented. No focal neurological deficits. Extremities: Symmetric 5 x 5 power.   Data Reviewed: Basic Metabolic Panel:  Recent Labs Lab 01/22/15 1616 01/25/15 1330  NA 137 137  K 3.9 3.5  CL 105 104  CO2 21* 23  GLUCOSE 119* 96  BUN 11 11  CREATININE 0.85 0.84  CALCIUM 8.4* 8.1*   Liver Function Tests:  Recent Labs Lab 01/22/15 1616 01/25/15 1330  AST 21 22  ALT 18 15  ALKPHOS 66 58  BILITOT 0.9 1.0  PROT 7.7 7.2  ALBUMIN 4.1 3.7    Recent Labs Lab 01/22/15 1616 01/25/15 1330  LIPASE 23 17*   No results for input(s): AMMONIA in the last 168 hours. CBC:  Recent Labs Lab 01/22/15 1616 01/25/15 1330  WBC 6.2 5.6  NEUTROABS  --  4.1  HGB 14.1 14.1  HCT 42.5 42.3  MCV 87.4 85.3  PLT 279 205   Cardiac Enzymes: No results for input(s): CKTOTAL, CKMB, CKMBINDEX, TROPONINI in the last 168 hours. BNP (last 3 results) No results for input(s): PROBNP in the last 8760 hours. CBG:  Recent Labs Lab 01/26/15 1914 01/27/15 0007 01/27/15 0407 01/27/15 0809 01/27/15 1214  GLUCAP 72 81 85 83 81    No results found for this or any previous visit (from the past 240 hour(s)).        Studies: US Abdomen Limited  01/26/2015   CLINICAL DATA:  Evaluate ascites for paracentesis  EXAM: LIMITED ABDOMEN ULTRASOUND FOR ASCITES  TECHNIQUE: Limited ultrasound survey for ascites was performed in all four abdominal quadrants.  COMPARISON:  01/25/2015  FINDINGS: Four quadrant survey of the abdomen was performed demonstrating a small volume of ascites within the right side of pelvis. There is insufficient volume of ascites for paracentesis.  IMPRESSION: 1. Small volume of ascites identified within the right side of pelvis.   Electronically Signed   By: Kerby Moors M.D.   On: 01/26/2015 16:24   Ir Fluoro Guide Cv Line  Left  01/26/2015   INDICATION: Poor venous access. In need of intravenous access for medication administration and blood draws.  EXAM: ULTRASOUND AND FLUOROSCOPIC GUIDED PICC LINE INSERTION  MEDICATIONS: None.  CONTRAST:  None  FLUOROSCOPY TIME:  12 seconds.  COMPLICATIONS: None immediate  TECHNIQUE: The procedure, risks, benefits, and alternatives were explained to the patient and informed written consent was obtained. A timeout was performed prior to the initiation of the procedure.  The left upper extremity was prepped with chlorhexidine in a sterile fashion, and a sterile drape was applied covering the operative field. Maximum barrier sterile technique with sterile gowns and gloves were used for the procedure. A timeout was performed prior to the initiation of the procedure. Local anesthesia was provided with 1% lidocaine.  Under direct ultrasound guidance, the left brachia vein was accessed with a micropuncture kit after the overlying soft tissues were anesthetized with 1% lidocaine. An ultrasound image was saved for documentation purposes. A guidewire was advanced to the level of the superior caval-atrial junction for measurement purposes and the PICC line was cut to length. A  peel-away sheath was placed and a 52 cm, 5 [place], dual lumen was inserted to level of the superior caval-atrial junction. A post procedure spot fluoroscopic was obtained. The catheter easily aspirated and flushed and was sutured in place. A dressing was placed. The patient tolerated the procedure well without immediate post procedural complication.  FINDINGS: After catheter placement, the tip lies within the superior cavoatrial junction. The catheter aspirates and flushes normally and is ready for immediate use.  IMPRESSION: Successful ultrasound and fluoroscopic guided placement of a left brachial vein approach, 5 French, dual lumen PICC with tip at the superior caval-atrial junction. The PICC line is ready for immediate use.    Electronically Signed   By: Sandi Mariscal M.D.   On: 01/26/2015 17:45   Ir US Guide Vasc Access Right  01/26/2015   INDICATION: Poor venous access. In need of intravenous access for medication administration and blood draws.  EXAM: ULTRASOUND AND FLUOROSCOPIC GUIDED PICC LINE INSERTION  MEDICATIONS: None.  CONTRAST:  None  FLUOROSCOPY TIME:  12 seconds.  COMPLICATIONS: None immediate  TECHNIQUE: The procedure, risks, benefits, and alternatives were explained to the patient and informed written consent was obtained. A timeout was performed prior to the initiation of the procedure.  The left upper extremity was prepped with chlorhexidine in a sterile fashion, and a sterile drape was applied covering the operative field. Maximum barrier sterile technique with sterile gowns and gloves were used for the procedure. A timeout was performed prior to the initiation of the procedure. Local anesthesia was provided with 1% lidocaine.  Under direct ultrasound guidance, the left brachia vein was accessed with a micropuncture kit after the overlying soft tissues were anesthetized with 1% lidocaine. An ultrasound image was saved for documentation purposes. A guidewire was advanced to the level of the superior caval-atrial junction for measurement purposes and the PICC line was cut to length. A peel-away sheath was placed and a 52 cm, 5 [place], dual lumen was inserted to level of the superior caval-atrial junction. A post procedure spot fluoroscopic was obtained. The catheter easily aspirated and flushed and was sutured in place. A dressing was placed. The patient tolerated the procedure well without immediate post procedural complication.  FINDINGS: After catheter placement, the tip lies within the superior cavoatrial junction. The catheter aspirates and flushes normally and is ready for immediate use.  IMPRESSION: Successful ultrasound and fluoroscopic guided placement of a left brachial vein approach, 5 French, dual lumen PICC  with tip at the superior caval-atrial junction. The PICC line is ready for immediate use.   Electronically Signed   By: Sandi Mariscal M.D.   On: 01/26/2015 17:45   Ct Entero Abd/pelvis W/cm  01/27/2015   CLINICAL DATA:  Patient with worsening abdominal pain. Prior abnormal CT of the abdomen.  EXAM: CT ABDOMEN AND PELVIS WITH CONTRAST (ENTEROGRAPHY)  TECHNIQUE: Multidetector CT of the abdomen and pelvis during bolus administration of intravenous contrast. Negative oral contrast VoLumen was given.  CONTRAST:  100 cc Omnipaque 300  COMPARISON:  CT abdomen pelvis 01/25/2015  FINDINGS: Lower chest: Normal heart size. Dependent atelectasis within the bilateral lower lobes.  Hepatobiliary: The liver is normal in size and contour. No focal hepatic lesion. Probable small amount of sludge within the gallbladder lumen. No intrahepatic or extrahepatic biliary ductal dilatation.  Pancreas: Unremarkable  Spleen: Multiple calcified granulomata in the spleen.  Adrenals/Urinary Tract: Adrenal glands are normal. Kidneys enhance symmetrically with contrast. Urinary bladder is unremarkable.  Stomach/Bowel: There is persistent circumferential  wall thickening of the mid and distal small bowel, predominantly within the central abdomen. Persistent mesenteric free fluid. No free intraperitoneal air. Oral contrast material is demonstrated within the colon.  Vascular/Lymphatic: Normal caliber abdominal aorta. No retroperitoneal lymphadenopathy. Interval increase in free fluid within the pelvis. Persistent perihepatic free fluid.  Other: None  Musculoskeletal: Lower thoracic and lumbar spine degenerative changes. No aggressive or acute appearing osseous lesions.  IMPRESSION: Grossly unchanged circumferential wall thickening of the mid small bowel within the central abdomen with associated mesenteric and pelvic free fluid. Findings are favored to represent a severe infectious or possibly inflammatory enteritis given appearance over time.  Ischemic etiology is not favored however not entirely excluded.  Interval increase in free fluid predominately within the pelvis.   Electronically Signed   By: Lovey Newcomer M.D.   On: 01/27/2015 12:31   Dg Chest Port 1 View  01/26/2015   CLINICAL DATA:  PICC line placement.  EXAM: PORTABLE CHEST 1 VIEW  COMPARISON:  May 09, 2007.  FINDINGS: The heart size and mediastinal contours are within normal limits. Both lungs are clear. No pneumothorax or pleural effusion is noted. No PICC line is noted. The visualized skeletal structures are unremarkable.  IMPRESSION: No acute cardiopulmonary abnormality seen.   Electronically Signed   By: Marijo Conception, M.D.   On: 01/26/2015 09:29        Scheduled Meds: . amLODipine  10 mg Oral Daily  . ciprofloxacin  500 mg Oral BID  . enoxaparin (LOVENOX) injection  75 mg Subcutaneous Q24H  . furosemide  40 mg Oral QODAY  . insulin aspart  0-9 Units Subcutaneous 6 times per day  . metroNIDAZOLE  250 mg Oral 3 times per day  . pantoprazole  80 mg Oral Daily   Continuous Infusions: . sodium chloride 75 mL/hr at 01/27/15 0901    Principal Problem:   Enteritis Active Problems:   Type 2 diabetes mellitus   Essential hypertension   Chronic vomiting   Ascites   Emesis, persistent    Time spent: 15 minutes.    Vernell Leep, MD, FACP, FHM. [facility] Pager [phone]  If 7PM-7AM, please contact night-coverage [email or web address] Password [email or web address] 01/27/2015, 2:44 PM    LOS: 2 days

## 2015-01-28 LAB — GLUCOSE, CAPILLARY
GLUCOSE-CAPILLARY: 95 mg/dL (ref 65–99)
GLUCOSE-CAPILLARY: 89 mg/dL (ref 65–99)
GLUCOSE-CAPILLARY: 159 mg/dL — AB (ref 65–99)
Glucose-Capillary: 90 mg/dL (ref 65–99)
Glucose-Capillary: 112 mg/dL — ABNORMAL HIGH (ref 65–99)

## 2015-01-28 NOTE — Progress Notes (Signed)
[facility] Subjective: Since I last evaluated the patient, her overall condition is unchanged. She continues to complain of abdominal pain. She denies having any nausea, vomiting, melena or hematochezia. Some looser stools after she received the contrast.    Objective: Vital signs in last 24 hours: Temp:  [98.2 F (36.8 C)-99 F (37.2 C)] 98.5 F (36.9 C) (09/25 1610) Pulse Rate:  [71-76] 73 (09/25 0632) Resp:  [16-18] 16 (09/25 0632) BP: (130-158)/(47-82) 149/80 mmHg (09/25 0632) SpO2:  [98 %-99 %] 98 % (09/25 0632) Last BM Date: 01/22/15  Intake/Output from previous day: 09/24 0701 - 09/25 0700 In: 1993.8 [P.O.:420; I.V.:1573.8] Out: 1200 [Urine:1200] Intake/Output this shift: Total I/O In: 262.5 [I.V.:262.5] Out: -   General appearance: alert, cooperative, appears stated age and morbidly obese Resp: clear to auscultation bilaterally Cardio: regular rate and rhythm, S1, S2 normal, no murmur, click, rub or gallop GI: soft, morbidly obese with periumbilical tenderness on palpation; bowel sounds normal; no masses,  no organomegaly Extremities: extremities have 3 + edema, atraumatic   Lab Results:  Recent Labs  01/25/15 1330  WBC 5.6  HGB 14.1  HCT 42.3  PLT 205   BMET  Recent Labs  01/25/15 1330  NA 137  K 3.5  CL 104  CO2 23  GLUCOSE 96  BUN 11  CREATININE 0.84  CALCIUM 8.1*   LFT  Recent Labs  01/25/15 1330  PROT 7.2  ALBUMIN 3.7  AST 22  ALT 15  ALKPHOS 58  BILITOT 1.0   Studies/Results: US Abdomen Limited  01/26/2015   CLINICAL DATA:  Evaluate ascites for paracentesis  EXAM: LIMITED ABDOMEN ULTRASOUND FOR ASCITES  TECHNIQUE: Limited ultrasound survey for ascites was performed in all four abdominal quadrants.  COMPARISON:  01/25/2015  FINDINGS: Four quadrant survey of the abdomen was performed demonstrating a small volume of ascites within the right side of pelvis. There is insufficient volume of ascites for paracentesis.  IMPRESSION: 1.  Small volume of ascites identified within the right side of pelvis.   Electronically Signed   By: Kerby Moors M.D.   On: 01/26/2015 16:24   Ir Fluoro Guide Cv Line Left  01/26/2015   INDICATION: Poor venous access. In need of intravenous access for medication administration and blood draws.  EXAM: ULTRASOUND AND FLUOROSCOPIC GUIDED PICC LINE INSERTION  MEDICATIONS: None.  CONTRAST:  None  FLUOROSCOPY TIME:  12 seconds.  COMPLICATIONS: None immediate  TECHNIQUE: The procedure, risks, benefits, and alternatives were explained to the patient and informed written consent was obtained. A timeout was performed prior to the initiation of the procedure.  The left upper extremity was prepped with chlorhexidine in a sterile fashion, and a sterile drape was applied covering the operative field. Maximum barrier sterile technique with sterile gowns and gloves were used for the procedure. A timeout was performed prior to the initiation of the procedure. Local anesthesia was provided with 1% lidocaine.  Under direct ultrasound guidance, the left brachia vein was accessed with a micropuncture kit after the overlying soft tissues were anesthetized with 1% lidocaine. An ultrasound image was saved for documentation purposes. A guidewire was advanced to the level of the superior caval-atrial junction for measurement purposes and the PICC line was cut to length. A peel-away sheath was placed and a 52 cm, 5 [place], dual lumen was inserted to level of the superior caval-atrial junction. A post procedure spot fluoroscopic was obtained. The catheter easily aspirated and flushed and was sutured in place. A dressing  was placed. The patient tolerated the procedure well without immediate post procedural complication.  FINDINGS: After catheter placement, the tip lies within the superior cavoatrial junction. The catheter aspirates and flushes normally and is ready for immediate use.  IMPRESSION: Successful ultrasound and fluoroscopic guided  placement of a left brachial vein approach, 5 French, dual lumen PICC with tip at the superior caval-atrial junction. The PICC line is ready for immediate use.   Electronically Signed   By: Sandi Mariscal M.D.   On: 01/26/2015 17:45   Ir US Guide Vasc Access Right  01/26/2015   INDICATION: Poor venous access. In need of intravenous access for medication administration and blood draws.  EXAM: ULTRASOUND AND FLUOROSCOPIC GUIDED PICC LINE INSERTION  MEDICATIONS: None.  CONTRAST:  None  FLUOROSCOPY TIME:  12 seconds.  COMPLICATIONS: None immediate  TECHNIQUE: The procedure, risks, benefits, and alternatives were explained to the patient and informed written consent was obtained. A timeout was performed prior to the initiation of the procedure.  The left upper extremity was prepped with chlorhexidine in a sterile fashion, and a sterile drape was applied covering the operative field. Maximum barrier sterile technique with sterile gowns and gloves were used for the procedure. A timeout was performed prior to the initiation of the procedure. Local anesthesia was provided with 1% lidocaine.  Under direct ultrasound guidance, the left brachia vein was accessed with a micropuncture kit after the overlying soft tissues were anesthetized with 1% lidocaine. An ultrasound image was saved for documentation purposes. A guidewire was advanced to the level of the superior caval-atrial junction for measurement purposes and the PICC line was cut to length. A peel-away sheath was placed and a 52 cm, 5 [place], dual lumen was inserted to level of the superior caval-atrial junction. A post procedure spot fluoroscopic was obtained. The catheter easily aspirated and flushed and was sutured in place. A dressing was placed. The patient tolerated the procedure well without immediate post procedural complication.  FINDINGS: After catheter placement, the tip lies within the superior cavoatrial junction. The catheter aspirates and flushes normally  and is ready for immediate use.  IMPRESSION: Successful ultrasound and fluoroscopic guided placement of a left brachial vein approach, 5 French, dual lumen PICC with tip at the superior caval-atrial junction. The PICC line is ready for immediate use.   Electronically Signed   By: Sandi Mariscal M.D.   On: 01/26/2015 17:45   Ct Entero Abd/pelvis W/cm  01/27/2015   CLINICAL DATA:  Patient with worsening abdominal pain. Prior abnormal CT of the abdomen.  EXAM: CT ABDOMEN AND PELVIS WITH CONTRAST (ENTEROGRAPHY)  TECHNIQUE: Multidetector CT of the abdomen and pelvis during bolus administration of intravenous contrast. Negative oral contrast VoLumen was given.  CONTRAST:  100 cc Omnipaque 300  COMPARISON:  CT abdomen pelvis 01/25/2015  FINDINGS: Lower chest: Normal heart size. Dependent atelectasis within the bilateral lower lobes.  Hepatobiliary: The liver is normal in size and contour. No focal hepatic lesion. Probable small amount of sludge within the gallbladder lumen. No intrahepatic or extrahepatic biliary ductal dilatation.  Pancreas: Unremarkable  Spleen: Multiple calcified granulomata in the spleen.  Adrenals/Urinary Tract: Adrenal glands are normal. Kidneys enhance symmetrically with contrast. Urinary bladder is unremarkable.  Stomach/Bowel: There is persistent circumferential wall thickening of the mid and distal small bowel, predominantly within the central abdomen. Persistent mesenteric free fluid. No free intraperitoneal air. Oral contrast material is demonstrated within the colon.  Vascular/Lymphatic: Normal caliber abdominal aorta. No retroperitoneal lymphadenopathy. Interval increase  in free fluid within the pelvis. Persistent perihepatic free fluid.  Other: None  Musculoskeletal: Lower thoracic and lumbar spine degenerative changes. No aggressive or acute appearing osseous lesions.  IMPRESSION: Grossly unchanged circumferential wall thickening of the mid small bowel within the central abdomen with  associated mesenteric and pelvic free fluid. Findings are favored to represent a severe infectious or possibly inflammatory enteritis given appearance over time. Ischemic etiology is not favored however not entirely excluded.  Interval increase in free fluid predominately within the pelvis.   Electronically Signed   By: Lovey Newcomer M.D.   On: 01/27/2015 12:31   Medications: I have reviewed the patient's current medications.  Assessment/Plan: 1) Abdominal pain with circumferential small bowel thickening with free fluid in the pelvis; etiology unclear-seems somewhat better on Cipro and Flagyl.  2) Vitamin B12 deficiency  3) Morbid obesity.   LOS: 3 days   MANN,JYOTHI 01/28/2015, 10:10 AM

## 2015-01-28 NOTE — Progress Notes (Addendum)
PROGRESS NOTE    Tammy [name] ZDG:[id number] [name]: 11-12-1960 DOA: 01/25/2015 PCP: Garnet Koyanagi, DO  GI: Dr. Silvano Rusk  HPI/Brief narrative 54 y.o. female with a past medical history significant for hypertension, type 2 diabetes on Victoza, GERD, and chronic intermittent abdominal pain who presents with 5 days severe, colicky abdominal pain, vomiting, and inability to take by mouth. No fever, diarrhea reported. No recent travel or antibiotic exposure. Since the beginning of this episode the patient has been to the ER at [facility], the [facility], and her PCPs office twice, without relief of pain or nausea. Of note the patient had called Dr. Celesta Aver office who scheduled her for a paracentesis this coming Monday (history of large volume ascites of unknown source on and off on multiple imaging studies which also was thought to be the source of her abdominal pain). In the ED, CT abdomen showed new focal segment of small bowel thickening and some ascites. Admitted for further evaluation and management.   Assessment/Plan:  Recurrent/chronic abdominal pain, nausea & vomiting - Patient has long history of chronic intermittent abdominal pain, nausea, vomiting and ascites by imaging studies. No diarrhea reported. Last BM 4 days ago. No sickly contacts, eating anything unusual or antibiotic exposure. - As per review of records, normal EGD, normal small bowel follow-through. - Patient apparently had been referred to GYN/Dr. Glo Herring where pelvic ultrasound showed ovarian cyst which was not thought to be a cause of her symptoms. - DD: Acute viral GE, diabetes related gastroparesis, related to ascites of unclear etiology, bowel angioedema related to use of ACEI - IR consulted for diagnostic paracentesis - not enough fluid to tap. - Bowel rest, IV fluids, supportive treatment and empirically started on antibiotics (Cipro and send Flagyl) - GI input appreciated: CT enterography requested: Shows  circumferential wall thickening of the mid small bowel within the central abdomen with associated mesenteric and pelvic free fluid. Suspecting infectious versus inflammatory etiology. Await GI follow-up-? Capsule endoscopy next - GI follow-up appreciated. Started on full liquid diets 9/24. Tolerating this diet without nausea or vomiting. Having watery BMs couple times yesterday and this morning. Complains of gaseous abdominal distention and continued intermittent significant abdominal pain. As per GI, etiology of her presentation unclear and recommend continuing antibiotics for 10 days.  Essential hypertension - Benazepril discontinued 9/24 due to concern for angioedema as cause of her bowel symptoms. Started amlodipine 9/24. - Controlled  Type II DM, controlled - Hold oral medications and treat with SSI.  Morbid obesity  Poor IV access - Status post PICC line placed by IR on 9/23    DVT prophylaxis: SCDs Code Status: Full Family Communication: none at bedside Disposition Plan: DC home when medically stable   Consultants:  [facility]  Interventional radiology  Procedures:  PICC line 9/23  Antibiotics:  Oral Cipro 9/22>  Oral Flagyl 9/22>   Subjective: Tolerating full liquid diet without nausea or vomiting. Having watery BMs couple times yesterday and this morning. Complains of gaseous abdominal distention and continued intermittent significant abdominal pain.   Objective: Filed Vitals:   01/27/15 1437 01/27/15 2137 01/28/15 0632 01/28/15 1406  BP: 130/47 158/82 149/80 143/62  Pulse: 76 71 73 83  Temp: 98.2 F (36.8 C) 99 F (37.2 C) 98.5 F (36.9 C) 97.9 F (36.6 C)  TempSrc: Oral Oral Oral Oral  Resp: _0 Height:      Weight:      SpO2: 99% 99% 98% 100%  Intake/Output Summary (Last 24 hours) at 01/28/15 1504 Last data filed at 01/28/15 1406  Gross per 24 hour  Intake 2302.5 ml  Output      0 ml  Net 2302.5 ml   Filed Weights   01/25/15  1222 01/25/15 1545  Weight: 153.316 kg (338 lb) 153.316 kg (338 lb)     Exam:  General exam: Moderately built and morbidly obese female patient lying comfortably supine in bed. Respiratory system: Clear. No increased work of breathing. Cardiovascular system: S1 & S2 heard, RRR. No JVD, murmurs, gallops, clicks or pedal edema. Gastrointestinal system: Abdomen is nondistended, soft diffuse mild tenderness without peritoneal signs. Normal bowel sounds heard. Central nervous system: Alert and oriented. No focal neurological deficits. Extremities: Symmetric 5 x 5 power.   Data Reviewed: Basic Metabolic Panel:  Recent Labs Lab 01/22/15 1616 01/25/15 1330  NA 137 137  K 3.9 3.5  CL 105 104  CO2 21* 23  GLUCOSE 119* 96  BUN 11 11  CREATININE 0.85 0.84  CALCIUM 8.4* 8.1*   Liver Function Tests:  Recent Labs Lab 01/22/15 1616 01/25/15 1330  AST 21 22  ALT 18 15  ALKPHOS 66 58  BILITOT 0.9 1.0  PROT 7.7 7.2  ALBUMIN 4.1 3.7    Recent Labs Lab 01/22/15 1616 01/25/15 1330  LIPASE 23 17*   No results for input(s): AMMONIA in the last 168 hours. CBC:  Recent Labs Lab 01/22/15 1616 01/25/15 1330  WBC 6.2 5.6  NEUTROABS  --  4.1  HGB 14.1 14.1  HCT 42.5 42.3  MCV 87.4 85.3  PLT 279 205   Cardiac Enzymes: No results for input(s): CKTOTAL, CKMB, CKMBINDEX, TROPONINI in the last 168 hours. BNP (last 3 results) No results for input(s): PROBNP in the last 8760 hours. CBG:  Recent Labs Lab 01/27/15 2003 01/27/15 2333 01/28/15 0358 01/28/15 0815 01/28/15 1222  GLUCAP 95 86 95 89 90    No results found for this or any previous visit (from the past 240 hour(s)).        Studies: US Abdomen Limited  01/26/2015   CLINICAL DATA:  Evaluate ascites for paracentesis  EXAM: LIMITED ABDOMEN ULTRASOUND FOR ASCITES  TECHNIQUE: Limited ultrasound survey for ascites was performed in all four abdominal quadrants.  COMPARISON:  01/25/2015  FINDINGS: Four quadrant  survey of the abdomen was performed demonstrating a small volume of ascites within the right side of pelvis. There is insufficient volume of ascites for paracentesis.  IMPRESSION: 1. Small volume of ascites identified within the right side of pelvis.   Electronically Signed   By: Kerby Moors M.D.   On: 01/26/2015 16:24   Ir Fluoro Guide Cv Line Left  01/26/2015   INDICATION: Poor venous access. In need of intravenous access for medication administration and blood draws.  EXAM: ULTRASOUND AND FLUOROSCOPIC GUIDED PICC LINE INSERTION  MEDICATIONS: None.  CONTRAST:  None  FLUOROSCOPY TIME:  12 seconds.  COMPLICATIONS: None immediate  TECHNIQUE: The procedure, risks, benefits, and alternatives were explained to the patient and informed written consent was obtained. A timeout was performed prior to the initiation of the procedure.  The left upper extremity was prepped with chlorhexidine in a sterile fashion, and a sterile drape was applied covering the operative field. Maximum barrier sterile technique with sterile gowns and gloves were used for the procedure. A timeout was performed prior to the initiation of the procedure. Local anesthesia was provided with 1% lidocaine.  Under direct ultrasound guidance, the left  brachia vein was accessed with a micropuncture kit after the overlying soft tissues were anesthetized with 1% lidocaine. An ultrasound image was saved for documentation purposes. A guidewire was advanced to the level of the superior caval-atrial junction for measurement purposes and the PICC line was cut to length. A peel-away sheath was placed and a 52 cm, 5 [place], dual lumen was inserted to level of the superior caval-atrial junction. A post procedure spot fluoroscopic was obtained. The catheter easily aspirated and flushed and was sutured in place. A dressing was placed. The patient tolerated the procedure well without immediate post procedural complication.  FINDINGS: After catheter placement, the  tip lies within the superior cavoatrial junction. The catheter aspirates and flushes normally and is ready for immediate use.  IMPRESSION: Successful ultrasound and fluoroscopic guided placement of a left brachial vein approach, 5 French, dual lumen PICC with tip at the superior caval-atrial junction. The PICC line is ready for immediate use.   Electronically Signed   By: Sandi Mariscal M.D.   On: 01/26/2015 17:45   Ir US Guide Vasc Access Right  01/26/2015   INDICATION: Poor venous access. In need of intravenous access for medication administration and blood draws.  EXAM: ULTRASOUND AND FLUOROSCOPIC GUIDED PICC LINE INSERTION  MEDICATIONS: None.  CONTRAST:  None  FLUOROSCOPY TIME:  12 seconds.  COMPLICATIONS: None immediate  TECHNIQUE: The procedure, risks, benefits, and alternatives were explained to the patient and informed written consent was obtained. A timeout was performed prior to the initiation of the procedure.  The left upper extremity was prepped with chlorhexidine in a sterile fashion, and a sterile drape was applied covering the operative field. Maximum barrier sterile technique with sterile gowns and gloves were used for the procedure. A timeout was performed prior to the initiation of the procedure. Local anesthesia was provided with 1% lidocaine.  Under direct ultrasound guidance, the left brachia vein was accessed with a micropuncture kit after the overlying soft tissues were anesthetized with 1% lidocaine. An ultrasound image was saved for documentation purposes. A guidewire was advanced to the level of the superior caval-atrial junction for measurement purposes and the PICC line was cut to length. A peel-away sheath was placed and a 52 cm, 5 [place], dual lumen was inserted to level of the superior caval-atrial junction. A post procedure spot fluoroscopic was obtained. The catheter easily aspirated and flushed and was sutured in place. A dressing was placed. The patient tolerated the procedure  well without immediate post procedural complication.  FINDINGS: After catheter placement, the tip lies within the superior cavoatrial junction. The catheter aspirates and flushes normally and is ready for immediate use.  IMPRESSION: Successful ultrasound and fluoroscopic guided placement of a left brachial vein approach, 5 French, dual lumen PICC with tip at the superior caval-atrial junction. The PICC line is ready for immediate use.   Electronically Signed   By: Sandi Mariscal M.D.   On: 01/26/2015 17:45   Ct Entero Abd/pelvis W/cm  01/27/2015   CLINICAL DATA:  Patient with worsening abdominal pain. Prior abnormal CT of the abdomen.  EXAM: CT ABDOMEN AND PELVIS WITH CONTRAST (ENTEROGRAPHY)  TECHNIQUE: Multidetector CT of the abdomen and pelvis during bolus administration of intravenous contrast. Negative oral contrast VoLumen was given.  CONTRAST:  100 cc Omnipaque 300  COMPARISON:  CT abdomen pelvis 01/25/2015  FINDINGS: Lower chest: Normal heart size. Dependent atelectasis within the bilateral lower lobes.  Hepatobiliary: The liver is normal in size and contour. No focal hepatic  lesion. Probable small amount of sludge within the gallbladder lumen. No intrahepatic or extrahepatic biliary ductal dilatation.  Pancreas: Unremarkable  Spleen: Multiple calcified granulomata in the spleen.  Adrenals/Urinary Tract: Adrenal glands are normal. Kidneys enhance symmetrically with contrast. Urinary bladder is unremarkable.  Stomach/Bowel: There is persistent circumferential wall thickening of the mid and distal small bowel, predominantly within the central abdomen. Persistent mesenteric free fluid. No free intraperitoneal air. Oral contrast material is demonstrated within the colon.  Vascular/Lymphatic: Normal caliber abdominal aorta. No retroperitoneal lymphadenopathy. Interval increase in free fluid within the pelvis. Persistent perihepatic free fluid.  Other: None  Musculoskeletal: Lower thoracic and lumbar spine  degenerative changes. No aggressive or acute appearing osseous lesions.  IMPRESSION: Grossly unchanged circumferential wall thickening of the mid small bowel within the central abdomen with associated mesenteric and pelvic free fluid. Findings are favored to represent a severe infectious or possibly inflammatory enteritis given appearance over time. Ischemic etiology is not favored however not entirely excluded.  Interval increase in free fluid predominately within the pelvis.   Electronically Signed   By: Lovey Newcomer M.D.   On: 01/27/2015 12:31        Scheduled Meds: . amLODipine  10 mg Oral Daily  . ciprofloxacin  500 mg Oral BID  . enoxaparin (LOVENOX) injection  75 mg Subcutaneous Q24H  . furosemide  40 mg Oral QODAY  . insulin aspart  0-9 Units Subcutaneous 6 times per day  . metroNIDAZOLE  250 mg Oral 3 times per day  . pantoprazole  80 mg Oral Daily   Continuous Infusions:    Principal Problem:   Enteritis Active Problems:   Type 2 diabetes mellitus   Essential hypertension   Chronic vomiting   Ascites   Emesis, persistent    Time spent: 15 minutes.    Vernell Leep, MD, FACP, FHM. [facility] Pager [phone]  If 7PM-7AM, please contact night-coverage [email or web address] Password [email or web address] 01/28/2015, 3:04 PM    LOS: 3 days

## 2015-01-29 ENCOUNTER — Ambulatory Visit (HOSPITAL_COMMUNITY): Payer: 59

## 2015-01-29 DIAGNOSIS — K529 Noninfective gastroenteritis and colitis, unspecified: Secondary | ICD-10-CM

## 2015-01-29 DIAGNOSIS — E876 Hypokalemia: Secondary | ICD-10-CM

## 2015-01-29 DIAGNOSIS — R1033 Periumbilical pain: Secondary | ICD-10-CM

## 2015-01-29 LAB — CBC
HEMATOCRIT: 38.2 % (ref 36.0–46.0)
Hemoglobin: 12.8 g/dL (ref 12.0–15.0)
MCH: 28.8 pg (ref 26.0–34.0)
MCHC: 33.5 g/dL (ref 30.0–36.0)
MCV: 85.8 fL (ref 78.0–100.0)
Platelets: 237 10*3/uL (ref 150–400)
RBC: 4.45 MIL/uL (ref 3.87–5.11)
RDW: 13.4 % (ref 11.5–15.5)
WBC: 3.1 10*3/uL — AB (ref 4.0–10.5)

## 2015-01-29 LAB — BASIC METABOLIC PANEL
ANION GAP: 8 (ref 5–15)
BUN: <5 mg/dL — ABNORMAL LOW (ref 6–20)
CALCIUM: 8.5 mg/dL — AB (ref 8.9–10.3)
CO2: 29 mmol/L (ref 22–32)
Chloride: 103 mmol/L (ref 101–111)
Creatinine, Ser: 0.77 mg/dL (ref 0.44–1.00)
GFR calc Af Amer: >60 mL/min (ref >60)
GFR calc non Af Amer: >60 mL/min (ref >60)
GLUCOSE: 114 mg/dL — AB (ref 65–99)
POTASSIUM: 3.1 mmol/L — AB (ref 3.5–5.1)
Sodium: 140 mmol/L (ref 135–145)

## 2015-01-29 LAB — GLUCOSE, CAPILLARY
GLUCOSE-CAPILLARY: 102 mg/dL — AB (ref 65–99)
Glucose-Capillary: 93 mg/dL (ref 65–99)
Glucose-Capillary: 115 mg/dL — ABNORMAL HIGH (ref 65–99)
Glucose-Capillary: 133 mg/dL — ABNORMAL HIGH (ref 65–99)

## 2015-01-29 MED ORDER — CIPROFLOXACIN HCL 500 MG PO TABS: 500.0000 mg | ORAL_TABLET | Freq: Two times a day (BID) | ORAL | Status: DC

## 2015-01-29 MED ORDER — POTASSIUM CHLORIDE CRYS ER 10 MEQ PO TBCR: 20.0000 meq | EXTENDED_RELEASE_TABLET | Freq: Every day | ORAL | Status: DC

## 2015-01-29 MED ORDER — FUROSEMIDE 40 MG PO TABS: 40.0000 mg | ORAL_TABLET | ORAL | Status: DC

## 2015-01-29 MED ORDER — FLUTICASONE PROPIONATE 50 MCG/ACT NA SUSP: 2.0000 | Freq: Every day | NASAL | Status: DC | PRN

## 2015-01-29 MED ORDER — AMLODIPINE BESYLATE 10 MG PO TABS: 10.0000 mg | ORAL_TABLET | Freq: Every day | ORAL | Status: DC

## 2015-01-29 MED ORDER — METRONIDAZOLE 250 MG PO TABS: 250.0000 mg | ORAL_TABLET | Freq: Three times a day (TID) | ORAL | Status: DC

## 2015-01-29 MED ORDER — LORATADINE 10 MG PO TABS: 10.0000 mg | ORAL_TABLET | Freq: Every day | ORAL | Status: DC | PRN

## 2015-01-29 MED ORDER — POTASSIUM CHLORIDE CRYS ER 20 MEQ PO TBCR: 40.0000 meq | EXTENDED_RELEASE_TABLET | Freq: Once | ORAL | Status: DC

## 2015-01-29 MED ORDER — ACETAMINOPHEN 325 MG PO TABS: 650.0000 mg | ORAL_TABLET | Freq: Four times a day (QID) | ORAL | Status: DC | PRN

## 2015-01-29 NOTE — Progress Notes (Signed)
L PICC D/C per MD order 48cm intact/vaseline pressure dsg applied/pressure x5 min/no bleeding.

## 2015-01-29 NOTE — Progress Notes (Signed)
[facility] Progress Note  Subjective: Afeb. Tol full liquids. Hungry, but feels full after a few bites. Would like to try soft diet.  Has been up ambulating. Less abd pain. Feels she is getting better.  Says before she came in she often felt full soon, and felt like she had to burp. Felt relief with burping.   Objective:  Vital signs in last 24 hours: Temp:  [97.9 F (36.6 C)-99 F (37.2 C)] 99 F (37.2 C) (09/26 0430) Pulse Rate:  [67-84] 84 (09/26 0430) Resp:  [16-18] 16 (09/26 0430) BP: (138-155)/(62-68) 138/68 mmHg (09/26 0430) SpO2:  [96 %-100 %] 96 % (09/26 0430) Last BM Date: 01/28/15 General:   Alert,  Well-developed,    in NAD Heart:  Regular rate and rhythm; no murmurs Pulm;lungs clear Abdomen:  Soft, mild mid abd tenderness to palpation, Normal bowel sounds, without guarding, and without rebound.   Extremities: 3+ edema Neurologic:  Alert and  oriented x4;  grossly normal neurologically. Psych: Alert and cooperative. Normal mood and affect.  Intake/Output from previous day: 09/25 0701 - 09/26 0700 In: 1042.5 [P.O.:780; I.V.:262.5] Out: -   Ct Entero Abd/pelvis W/cm  01/27/2015   CLINICAL DATA:  Patient with worsening abdominal pain. Prior abnormal CT of the abdomen.  EXAM: CT ABDOMEN AND PELVIS WITH CONTRAST (ENTEROGRAPHY)  TECHNIQUE: Multidetector CT of the abdomen and pelvis during bolus administration of intravenous contrast. Negative oral contrast VoLumen was given.  CONTRAST:  100 cc Omnipaque 300  COMPARISON:  CT abdomen pelvis 01/25/2015  FINDINGS: Lower chest: Normal heart size. Dependent atelectasis within the bilateral lower lobes.  Hepatobiliary: The liver is normal in size and contour. No focal hepatic lesion. Probable small amount of sludge within the gallbladder lumen. No intrahepatic or extrahepatic biliary ductal dilatation.  Pancreas: Unremarkable  Spleen: Multiple calcified granulomata in the spleen.  Adrenals/Urinary Tract: Adrenal  glands are normal. Kidneys enhance symmetrically with contrast. Urinary bladder is unremarkable.  Stomach/Bowel: There is persistent circumferential wall thickening of the mid and distal small bowel, predominantly within the central abdomen. Persistent mesenteric free fluid. No free intraperitoneal air. Oral contrast material is demonstrated within the colon.  Vascular/Lymphatic: Normal caliber abdominal aorta. No retroperitoneal lymphadenopathy. Interval increase in free fluid within the pelvis. Persistent perihepatic free fluid.  Other: None  Musculoskeletal: Lower thoracic and lumbar spine degenerative changes. No aggressive or acute appearing osseous lesions.  IMPRESSION: Grossly unchanged circumferential wall thickening of the mid small bowel within the central abdomen with associated mesenteric and pelvic free fluid. Findings are favored to represent a severe infectious or possibly inflammatory enteritis given appearance over time. Ischemic etiology is not favored however not entirely excluded.  Interval increase in free fluid predominately within the pelvis.   Electronically Signed   By: Lovey Newcomer M.D.   On: 01/27/2015 12:31    ASSESSMENT/PLAN:  54 yo female admitted with abdominal pain with circumferential small bowel thickening with free fluid in the pelvis, etiology unclear.  Seems to be improving on cipro and flagyl. Will give trial of soft/low residue diet.     LOS: 4 days   Hvozdovic, Deloris Ping 01/29/2015, Pager [phone]     Attending physician's note   I have taken an interval history, reviewed the chart and examined the patient. I agree with the Advanced Practitioner's note, impression and recommendations. Enteritis clinically improving, etiology unclear. Continue Cipro/Flagyl for now. Advance diet as tolerated. If she tolerates advanced diet and pain is controlled on oral  medications will be ready for discharge from GI standpoint. She will need outpatient follow up with Dr.  Carlean Purl.   Pricilla Riffle. Fuller Plan, MD FACG [phone] Mon-Fri 8a-5p [phone] Mon-Fri 5p-8a, weekends, holidays or per [facility]

## 2015-01-29 NOTE — Progress Notes (Signed)
Patient tolerating soft diet well, denies any pain at present.  Patient had BM this am and is passing gas.  Vital signs are stable, walking in halls without difficulty.  Discharge instructions reviewed with patient.  Prescription given.  Patient verbalizes understanding and will be discharged to home.

## 2015-01-29 NOTE — Discharge Summary (Signed)
Physician Discharge Summary  Tammy [name] EKC:[id number] [name]: 1961/03/28 DOA: 01/25/2015  PCP: Garnet Koyanagi, DO  Admit date: 01/25/2015 Discharge date: 01/29/2015  Time spent: Less than 30 minutes  Recommendations for Outpatient Follow-up:  1. Dr. Garnet Koyanagi, PCP in 5 days with repeat labs (CBC & BMP). Please follow hemoglobin A1c, CEA, CA 125 that were sent from the hospital. 2. Dr. Silvano Rusk, [facility]  Discharge Diagnoses:  Principal Problem:   Enteritis Active Problems:   Type 2 diabetes mellitus   Essential hypertension   Chronic vomiting   Ascites   Emesis, persistent   Discharge Condition: Improved & Stable  Diet recommendation: Soft and low fiber diet.  Filed Weights   01/25/15 1222 01/25/15 1545  Weight: 153.316 kg (338 lb) 153.316 kg (338 lb)    History of present illness:  54 y.o. female with a past medical history significant for hypertension, type 2 diabetes on Victoza, GERD, and chronic intermittent abdominal pain who presents with 5 days severe, colicky abdominal pain, vomiting, and inability to take by mouth. No fever, diarrhea reported. No recent travel or antibiotic exposure. Since the beginning of this episode the patient has been to the ER at [facility], the [facility], and her PCPs office twice, without relief of pain or nausea. Of note the patient had called Dr. Celesta Aver office who scheduled her for a paracentesis this coming Monday (history of large volume ascites of unknown source on and off on multiple imaging studies which also was thought to be the source of her abdominal pain). In the ED, CT abdomen showed new focal segment of small bowel thickening and some ascites. Admitted for further evaluation and management.  Hospital Course:   Recurrent/chronic abdominal pain, nausea & vomiting - Patient has long history of chronic intermittent abdominal pain, nausea, vomiting and ascites by imaging studies. Some diarrhea a week prior to admission but no  BM 4 days PTA. No sickly contacts, eating anything unusual or antibiotic exposure. - As per review of records, normal EGD, normal small bowel follow-through. - Patient apparently had been referred to GYN/Dr. Glo Herring where pelvic ultrasound showed ovarian cyst which was not thought to be a cause of her symptoms. - DD: Acute viral GE, diabetes related gastroparesis, related to ascites of unclear etiology, bowel angioedema related to use of ACEI - IR consulted for diagnostic paracentesis - not enough fluid to tap. - Bowel rest, IV fluids, supportive treatment and empirically started on antibiotics (Cipro and send Flagyl) - GI input appreciated: CT enterography requested: Shows circumferential wall thickening of the mid small bowel within the central abdomen with associated mesenteric and pelvic free fluid. Suspecting infectious versus inflammatory etiology.  -  as per GI, diet was gradually advanced. Over the last 24 hours, patient has made significant improvement. She denies nausea, vomiting. Tolerated small amount of soft diet this morning. No abdominal pain or BM since yesterday afternoon. GI has seen and cleared her for discharge home with outpatient follow-up with GI. She will complete total 10 days of oral antibiotics for presumed infectious enteritis.  -  pregnancy test: Negative   Essential hypertension - Benazepril discontinued 9/24 due to concern for angioedema as cause of her bowel symptoms. Started amlodipine 9/24. - Controlled  Type II DM, controlled - treated with SSI in the hospital with reasonable control. Resume Victoza at discharge.  Morbid obesity  Poor IV access - Status post PICC line placed by IR on 9/23-removed at discharge.   Hypokalemia  - Replaced  prior to discharge. Resume home dose of Lasix and potassium supplements at discharge. Outpatient follow-up with repeat CBC and BMP in a few days.      Consultants:  [facility]  Interventional  radiology  Procedures:  PICC line 9/23-removed 9/26   Antibiotics:  Oral Cipro 9/22>  Oral Flagyl 9/22>   Discharge Exam:  Complaints: Feels much better. Tolerated soft diet this morning. No nausea or vomiting. No abdominal pain or BM since yesterday evening.  Filed Vitals:   01/28/15 3888 01/28/15 1406 01/28/15 2216 01/29/15 0430  BP: 149/80 143/62 155/68 138/68  Pulse: 73 83 67 84  Temp: 98.5 F (36.9 C) 97.9 F (36.6 C) 98.4 F (36.9 C) 99 F (37.2 C)  TempSrc: Oral Oral Oral Oral  Resp: 16 18 16 16   Height:      Weight:      SpO2: 98% 100% 100% 96%    General exam: Moderately built and morbidly obese female patient seen sitting up & eating breakfast this morning. Respiratory system: Clear. No increased work of breathing. Cardiovascular system: S1 & S2 heard, RRR. No JVD, murmurs, gallops, clicks or pedal edema. Gastrointestinal system: Abdomen is nondistended, soft & non tender. Normal bowel sounds heard. Central nervous system: Alert and oriented. No focal neurological deficits. Extremities: Symmetric 5 x 5 power.  Discharge Instructions      Discharge Instructions    Call MD for:  extreme fatigue    Complete by:  As directed      Call MD for:  persistant dizziness or light-headedness    Complete by:  As directed      Call MD for:  persistant nausea and vomiting    Complete by:  As directed      Call MD for:  severe uncontrolled pain    Complete by:  As directed      Call MD for:  temperature >100.4    Complete by:  As directed      Diet Carb Modified    Complete by:  As directed      Discharge instructions    Complete by:  As directed   Diet: Soft and low fiber diet.     Increase activity slowly    Complete by:  As directed             Medication List    STOP taking these medications        benazepril 40 MG tablet  Commonly known as:  LOTENSIN      TAKE these medications        acetaminophen 325 MG tablet  Commonly known as:  TYLENOL   Take 2 tablets (650 mg total) by mouth every 6 (six) hours as needed for mild pain, moderate pain, fever or headache.     amLODipine 10 MG tablet  Commonly known as:  NORVASC  Take 1 tablet (10 mg total) by mouth daily.     ciprofloxacin 500 MG tablet  Commonly known as:  CIPRO  Take 1 tablet (500 mg total) by mouth 2 (two) times daily.     fluticasone 50 MCG/ACT nasal spray  Commonly known as:  FLONASE  Place 2 sprays into both nostrils daily as needed for allergies.     furosemide 40 MG tablet  Commonly known as:  LASIX  Take 1 tablet (40 mg total) by mouth every other day.     FUTURO SHEER SUPPORT HOSE Misc  1 packet by Other route daily. 1 pair of support hose  to wear daily     glucose blood test strip  Check blood sugar twice daily     Insulin Pen Needle 32G X 6 MM Misc  Commonly known as:  NOVOFINE  Inject 1.8 mg into the skin daily.     Liraglutide 18 MG/3ML Sopn  Commonly known as:  VICTOZA  Inject 0.3 mLs (1.8 mg total) into the skin daily.     loratadine 10 MG tablet  Commonly known as:  CLARITIN  Take 1 tablet (10 mg total) by mouth daily as needed for allergies.     metroNIDAZOLE 250 MG tablet  Commonly known as:  FLAGYL  Take 1 tablet (250 mg total) by mouth 3 (three) times daily.     pantoprazole 40 MG tablet  Commonly known as:  PROTONIX  Take 2 tablets (80 mg total) by mouth daily.     potassium chloride 10 MEQ tablet  Commonly known as:  K-DUR,KLOR-CON  Take 2 tablets (20 mEq total) by mouth daily.       Follow-up Information    Follow up with Garnet Koyanagi, DO. Schedule an appointment as soon as possible for a visit in 5 days.   Specialty:  Family Medicine   Why:  to be seen with repeat labs (CBC & BMP).   Contact information:   [address] [place] [address] [phone]       Schedule an appointment as soon as possible for a visit with Silvano Rusk, MD.   Specialty:  Gastroenterology   Contact information:   [address] [place] [address] [phone]        The results of significant diagnostics from this hospitalization (including imaging, microbiology, ancillary and laboratory) are listed below for reference.    Significant Diagnostic Studies: US Transvaginal Non-ob  02-16-2015   CLINICAL DATA:  Known LEFT ovarian cyst, increased pain on LEFT side since Saturday, hypertension, diabetes mellitus  EXAM: TRANSABDOMINAL AND TRANSVAGINAL ULTRASOUND OF PELVIS  TECHNIQUE: Both transabdominal and transvaginal ultrasound examinations of the pelvis were performed. Transabdominal technique was performed for global imaging of the pelvis including uterus, ovaries, adnexal regions, and pelvic cul-de-sac. It was necessary to proceed with endovaginal exam following the transabdominal exam to visualize the ovaries.  COMPARISON:  None  FINDINGS: Uterus  Surgically absent  Endometrium  N/A  Right ovary  Not visualized on either transabdominal or endovaginal imaging question related to atrophy versus obscuration by bowel  Left ovary  Measurements: 4.1 x 3.4 x 3.5 cm. Small simple appearing cysts measuring 2.5 x 1.3 x 2.4 cm and 2.4 x 2.0 x 1.8 cm. No solid mass.  Other findings  Large amount of free pelvic fluid/ascites, simple in character. No other masses.  IMPRESSION: Large amount of free pelvic fluid/ ascites.  Small simple appearing LEFT ovarian cyst.  Surgical absence of uterus with nonvisualization of RIGHT ovary.   Electronically Signed   By: Lavonia Dana M.D.   On: 02/16/15 18:17   US Transvaginal Non-ob  01/03/2015   Ultrasound today: Uterus absent. Vaginal cuff normal. Right ovary normal. Left ovary 2  thinwall echo-free avascular cyst one measuring 16 x 14 mm second 1 24 x  29 x 23 mm. There was some fluid noted in the cul-de-sac previous fluid at  the right adnexal region not seen from prior ultrasound exam.  US Pelvis Complete  February 16, 2015   CLINICAL DATA:  Known LEFT ovarian cyst, increased pain on  LEFT side since Saturday,  hypertension, diabetes mellitus  EXAM: TRANSABDOMINAL AND TRANSVAGINAL ULTRASOUND OF PELVIS  TECHNIQUE: Both transabdominal and transvaginal ultrasound examinations of the pelvis were performed. Transabdominal technique was performed for global imaging of the pelvis including uterus, ovaries, adnexal regions, and pelvic cul-de-sac. It was necessary to proceed with endovaginal exam following the transabdominal exam to visualize the ovaries.  COMPARISON:  None  FINDINGS: Uterus  Surgically absent  Endometrium  N/A  Right ovary  Not visualized on either transabdominal or endovaginal imaging question related to atrophy versus obscuration by bowel  Left ovary  Measurements: 4.1 x 3.4 x 3.5 cm. Small simple appearing cysts measuring 2.5 x 1.3 x 2.4 cm and 2.4 x 2.0 x 1.8 cm. No solid mass.  Other findings  Large amount of free pelvic fluid/ascites, simple in character. No other masses.  IMPRESSION: Large amount of free pelvic fluid/ ascites.  Small simple appearing LEFT ovarian cyst.  Surgical absence of uterus with nonvisualization of RIGHT ovary.   Electronically Signed   By: Lavonia Dana M.D.   On: 01/22/2015 18:17   Ct Abdomen Pelvis W Contrast  01/25/2015   CLINICAL DATA:  Abdominal pain and distention for the past 3 weeks. Followup ascites.  EXAM: CT ABDOMEN AND PELVIS WITH CONTRAST  TECHNIQUE: Multidetector CT imaging of the abdomen and pelvis was performed using the standard protocol following bolus administration of intravenous contrast.  CONTRAST:  12m OMNIPAQUE IOHEXOL 300 MG/ML SOLN, 1063mOMNIPAQUE IOHEXOL 300 MG/ML SOLN  COMPARISON:  Previous CT and ultrasound examinations, including the abdomen and pelvis CT dated 08/08/2014.  FINDINGS: The examination is somewhat limited by photon starvation due to the large size of the patient and the fact that she was image with her arms by her sides.  Small amount of free peritoneal fluid, with improvement since 08/08/2014.  The liver,  spleen, pancreas, gallbladder, adrenal glands, kidneys, urinary bladder and ureters are grossly normal. There is a small bowel loop in the right mid to lower abdomen and upper pelvis with diffuse wall thickening, with a maximum thickness of 10 mm. Mild adjacent mesenteric soft tissue stranding.  No colonic abnormalities. Mildly prominent retroperitoneal lymph nodes are unchanged, with no pathologically enlarged lymph nodes seen.  Surgically absent uterus. No adnexal masses are seen. Post appendectomy changes. The interstitial markings at the lung bases remain prominent. A small hiatal hernia is again demonstrated.  IMPRESSION: 1. Moderate diffuse wall thickening with mild adjacent mesenteric soft tissue stranding involving a small bowel loop in the right mid lower abdomen and upper pelvis. Differential considerations include infectious and inflammatory enteritis. Ischemic enteritis is less likely but not excluded. Small bowel lymphoma can also have this appearance. 2. Small amount of ascites, with improvement. 3. Small hiatal hernia. 4. Chronic interstitial lung disease or interstitial edema at the lung bases.   Electronically Signed   By: StClaudie Revering.D.   On: 01/25/2015 14:47   UsKoreabdomen Limited  01/26/2015   CLINICAL DATA:  Evaluate ascites for paracentesis  EXAM: LIMITED ABDOMEN ULTRASOUND FOR ASCITES  TECHNIQUE: Limited ultrasound survey for ascites was performed in all four abdominal quadrants.  COMPARISON:  01/25/2015  FINDINGS: Four quadrant survey of the abdomen was performed demonstrating a small volume of ascites within the right side of pelvis. There is insufficient volume of ascites for paracentesis.  IMPRESSION: 1. Small volume of ascites identified within the right side of pelvis.   Electronically Signed   By: TaKerby Moors.D.   On: 01/26/2015 16:24   Ir  Fluoro Guide Cv Line Left  01/26/2015   INDICATION: Poor venous access. In need of intravenous access for medication administration and  blood draws.  EXAM: ULTRASOUND AND FLUOROSCOPIC GUIDED PICC LINE INSERTION  MEDICATIONS: None.  CONTRAST:  None  FLUOROSCOPY TIME:  12 seconds.  COMPLICATIONS: None immediate  TECHNIQUE: The procedure, risks, benefits, and alternatives were explained to the patient and informed written consent was obtained. A timeout was performed prior to the initiation of the procedure.  The left upper extremity was prepped with chlorhexidine in a sterile fashion, and a sterile drape was applied covering the operative field. Maximum barrier sterile technique with sterile gowns and gloves were used for the procedure. A timeout was performed prior to the initiation of the procedure. Local anesthesia was provided with 1% lidocaine.  Under direct ultrasound guidance, the left brachia vein was accessed with a micropuncture kit after the overlying soft tissues were anesthetized with 1% lidocaine. An ultrasound image was saved for documentation purposes. A guidewire was advanced to the level of the superior caval-atrial junction for measurement purposes and the PICC line was cut to length. A peel-away sheath was placed and a 52 cm, 5 [place], dual lumen was inserted to level of the superior caval-atrial junction. A post procedure spot fluoroscopic was obtained. The catheter easily aspirated and flushed and was sutured in place. A dressing was placed. The patient tolerated the procedure well without immediate post procedural complication.  FINDINGS: After catheter placement, the tip lies within the superior cavoatrial junction. The catheter aspirates and flushes normally and is ready for immediate use.  IMPRESSION: Successful ultrasound and fluoroscopic guided placement of a left brachial vein approach, 5 French, dual lumen PICC with tip at the superior caval-atrial junction. The PICC line is ready for immediate use.   Electronically Signed   By: Sandi Mariscal M.D.   On: 01/26/2015 17:45   Ir US Guide Vasc Access Right  01/26/2015    INDICATION: Poor venous access. In need of intravenous access for medication administration and blood draws.  EXAM: ULTRASOUND AND FLUOROSCOPIC GUIDED PICC LINE INSERTION  MEDICATIONS: None.  CONTRAST:  None  FLUOROSCOPY TIME:  12 seconds.  COMPLICATIONS: None immediate  TECHNIQUE: The procedure, risks, benefits, and alternatives were explained to the patient and informed written consent was obtained. A timeout was performed prior to the initiation of the procedure.  The left upper extremity was prepped with chlorhexidine in a sterile fashion, and a sterile drape was applied covering the operative field. Maximum barrier sterile technique with sterile gowns and gloves were used for the procedure. A timeout was performed prior to the initiation of the procedure. Local anesthesia was provided with 1% lidocaine.  Under direct ultrasound guidance, the left brachia vein was accessed with a micropuncture kit after the overlying soft tissues were anesthetized with 1% lidocaine. An ultrasound image was saved for documentation purposes. A guidewire was advanced to the level of the superior caval-atrial junction for measurement purposes and the PICC line was cut to length. A peel-away sheath was placed and a 52 cm, 5 [place], dual lumen was inserted to level of the superior caval-atrial junction. A post procedure spot fluoroscopic was obtained. The catheter easily aspirated and flushed and was sutured in place. A dressing was placed. The patient tolerated the procedure well without immediate post procedural complication.  FINDINGS: After catheter placement, the tip lies within the superior cavoatrial junction. The catheter aspirates and flushes normally and is ready for immediate use.  IMPRESSION: Successful  ultrasound and fluoroscopic guided placement of a left brachial vein approach, 5 [place], dual lumen PICC with tip at the superior caval-atrial junction. The PICC line is ready for immediate use.   Electronically Signed    By: Sandi Mariscal M.D.   On: 01/26/2015 17:45   Ct Entero Abd/pelvis W/cm  01/27/2015   CLINICAL DATA:  Patient with worsening abdominal pain. Prior abnormal CT of the abdomen.  EXAM: CT ABDOMEN AND PELVIS WITH CONTRAST (ENTEROGRAPHY)  TECHNIQUE: Multidetector CT of the abdomen and pelvis during bolus administration of intravenous contrast. Negative oral contrast VoLumen was given.  CONTRAST:  100 cc Omnipaque 300  COMPARISON:  CT abdomen pelvis 01/25/2015  FINDINGS: Lower chest: Normal heart size. Dependent atelectasis within the bilateral lower lobes.  Hepatobiliary: The liver is normal in size and contour. No focal hepatic lesion. Probable small amount of sludge within the gallbladder lumen. No intrahepatic or extrahepatic biliary ductal dilatation.  Pancreas: Unremarkable  Spleen: Multiple calcified granulomata in the spleen.  Adrenals/Urinary Tract: Adrenal glands are normal. Kidneys enhance symmetrically with contrast. Urinary bladder is unremarkable.  Stomach/Bowel: There is persistent circumferential wall thickening of the mid and distal small bowel, predominantly within the central abdomen. Persistent mesenteric free fluid. No free intraperitoneal air. Oral contrast material is demonstrated within the colon.  Vascular/Lymphatic: Normal caliber abdominal aorta. No retroperitoneal lymphadenopathy. Interval increase in free fluid within the pelvis. Persistent perihepatic free fluid.  Other: None  Musculoskeletal: Lower thoracic and lumbar spine degenerative changes. No aggressive or acute appearing osseous lesions.  IMPRESSION: Grossly unchanged circumferential wall thickening of the mid small bowel within the central abdomen with associated mesenteric and pelvic free fluid. Findings are favored to represent a severe infectious or possibly inflammatory enteritis given appearance over time. Ischemic etiology is not favored however not entirely excluded.  Interval increase in free fluid predominately within the  pelvis.   Electronically Signed   By: Lovey Newcomer M.D.   On: 01/27/2015 12:31   Dg Chest Port 1 View  01/26/2015   CLINICAL DATA:  PICC line placement.  EXAM: PORTABLE CHEST 1 VIEW  COMPARISON:  May 09, 2007.  FINDINGS: The heart size and mediastinal contours are within normal limits. Both lungs are clear. No pneumothorax or pleural effusion is noted. No PICC line is noted. The visualized skeletal structures are unremarkable.  IMPRESSION: No acute cardiopulmonary abnormality seen.   Electronically Signed   By: Marijo Conception, M.D.   On: 01/26/2015 09:29    Microbiology: No results found for this or any previous visit (from the past 240 hour(s)).   Labs: Basic Metabolic Panel:  Recent Labs Lab 01/22/15 1616 01/25/15 1330 01/29/15 0940  NA 137 137 140  K 3.9 3.5 3.1*  CL 105 104 103  CO2 21* 23 29  GLUCOSE 119* 96 114*  BUN 11 11 <5*  CREATININE 0.85 0.84 0.77  CALCIUM 8.4* 8.1* 8.5*   Liver Function Tests:  Recent Labs Lab 01/22/15 1616 01/25/15 1330  AST 21 22  ALT 18 15  ALKPHOS 66 58  BILITOT 0.9 1.0  PROT 7.7 7.2  ALBUMIN 4.1 3.7    Recent Labs Lab 01/22/15 1616 01/25/15 1330  LIPASE 23 17*   No results for input(s): AMMONIA in the last 168 hours. CBC:  Recent Labs Lab 01/22/15 1616 01/25/15 1330 01/29/15 0940  WBC 6.2 5.6 3.1*  NEUTROABS  --  4.1  --   HGB 14.1 14.1 12.8  HCT 42.5 42.3 38.2  MCV 87.4  85.3 85.8  PLT 279 205 237   Cardiac Enzymes: No results for input(s): CKTOTAL, CKMB, CKMBINDEX, TROPONINI in the last 168 hours. BNP: BNP (last 3 results) No results for input(s): BNP in the last 8760 hours.  ProBNP (last 3 results) No results for input(s): PROBNP in the last 8760 hours.  CBG:  Recent Labs Lab 01/28/15 1949 01/28/15 2358 01/29/15 0428 01/29/15 0752 01/29/15 1235  GLUCAP 159* 102* 93 115* 133*        Signed:  Vernell Leep, MD, FACP, FHM. [facility] Pager [phone]  If 7PM-7AM, please contact  night-coverage [email or web address] Password [email or web address] 01/29/2015, 1:00 PM

## 2015-01-29 NOTE — Discharge Instructions (Signed)

## 2015-01-30 ENCOUNTER — Telehealth: Payer: Self-pay

## 2015-01-30 ENCOUNTER — Telehealth: Payer: Self-pay | Admitting: Internal Medicine

## 2015-01-30 LAB — HEMOGLOBIN A1C
HEMOGLOBIN A1C: 6.2 % — AB (ref 4.8–5.6)
MEAN PLASMA GLUCOSE: 131 mg/dL

## 2015-01-30 NOTE — Telephone Encounter (Signed)
Patient is scheduled for follow up with Arta Bruce, PA for 02/14/15 1:45

## 2015-01-30 NOTE — Telephone Encounter (Signed)
Admit date: 01/25/2015 Discharge date: 01/29/2015  Time spent: Less than 30 minutes  Recommendations for Outpatient Follow-up:  1. Dr. Garnet Koyanagi, PCP in 5 days with repeat labs (CBC & BMP). Please follow hemoglobin A1c, CEA, CA 125 that were sent from the hospital. 2. Dr. Silvano Rusk, [facility] follow up appt: 02/02/15 with Dr. Etter Sjogren at 11:30 am.    Transition Care Management Follow-up Telephone Call  How have you been since you were released from the hospital?  Pt states she is doing okay.  Abd pain continues to persists.  Abd pain is intermittent and is mostly felt after she takes medications with water.  Pain lasts anywhere from 30 mins to an hour after taking medications.  She tries to eat small bites with meds, but states she fears eliciting pain so she does not eat much.  She is also not drinking much fluids.      She denies fever, n/v/d.  Last BM: yesterday-Liquid stool.  No blood.  No mucous.  No change in odor.  Stools greenish black.     She says occasionally she has also noted feeling dizzy whenever she stands.    She was encouraged to eat small meals--Soft, carb modified diet with no fiber, to drink plenty of fluids and to move slowly when changing positions.  She stated understanding and agreed to try.     Blood sugars-118 last night.   Do you understand why you were in the hospital? yes   Do you understand the discharge instructions? yes  Items Reviewed:  Medications reviewed: yes  Allergies reviewed: yes  Dietary changes reviewed: yes  Referrals reviewed: yes, pt encouraged to schedule appointment with Dr. Carlean Purl.    Functional Questionnaire:   Activities of Daily Living (ADLs):   She states they are independent in the following: ambulation, bathing and hygiene, feeding, continence, grooming, toileting and dressing States they require assistance with the following: Pt's husband assists some during the day.    Any transportation  issues/concerns?: no   Any patient concerns? no   Confirmed importance and date/time of follow-up visits scheduled: yes   Confirmed with patient if condition begins to worsen call PCP or go to the ER: yes

## 2015-01-30 NOTE — Telephone Encounter (Signed)
Needs hosp f/u. 

## 2015-01-31 ENCOUNTER — Telehealth: Payer: Self-pay | Admitting: *Deleted

## 2015-02-02 ENCOUNTER — Encounter: Payer: Self-pay | Admitting: Family Medicine

## 2015-02-02 ENCOUNTER — Ambulatory Visit (INDEPENDENT_AMBULATORY_CARE_PROVIDER_SITE_OTHER): Payer: 59 | Admitting: Family Medicine

## 2015-02-02 DIAGNOSIS — R1084 Generalized abdominal pain: Secondary | ICD-10-CM

## 2015-02-02 DIAGNOSIS — N8320 Unspecified ovarian cysts: Secondary | ICD-10-CM

## 2015-02-02 DIAGNOSIS — K529 Noninfective gastroenteritis and colitis, unspecified: Secondary | ICD-10-CM

## 2015-02-02 DIAGNOSIS — E1165 Type 2 diabetes mellitus with hyperglycemia: Secondary | ICD-10-CM

## 2015-02-02 DIAGNOSIS — IMO0002 Reserved for concepts with insufficient information to code with codable children: Secondary | ICD-10-CM

## 2015-02-02 DIAGNOSIS — N83209 Unspecified ovarian cyst, unspecified side: Secondary | ICD-10-CM

## 2015-02-02 DIAGNOSIS — R609 Edema, unspecified: Secondary | ICD-10-CM | POA: Diagnosis not present

## 2015-02-02 MED ORDER — FUTURO SHEER SUPPORT HOSE MISC: 1.0000 | Freq: Every day | Status: DC

## 2015-02-02 MED ORDER — GLUCOSE BLOOD VI STRP: ORAL_STRIP | Status: DC

## 2015-02-02 NOTE — Progress Notes (Signed)
Pre visit review using our clinic review tool, if applicable. No additional management support is needed unless otherwise documented below in the visit note. 

## 2015-02-02 NOTE — Telephone Encounter (Signed)
error 

## 2015-02-02 NOTE — Assessment & Plan Note (Signed)
?   Angioedema Ace stopped and pt put on norvasc Pt is also on soft diet--- she will advance diet slowly F/u GI Repeat labs today

## 2015-02-02 NOTE — Progress Notes (Signed)
Patient ID: Tammy [name], female    [name]: 04-May-1961  Age: 54 y.o. MRN: [id number]    Subjective:  Subjective HPI Tammy [name] presents for f/u from hospital.  She was admitted 9/22 and d/c 9/26 with enteritis-- she saw GI in hosp and was d/c on abx.and soft diet.  Her pain has subsided a lot and is almost 100% gone.   Pt has app with GI on 10/10.    Review of Systems  Constitutional: Negative for diaphoresis, appetite change, fatigue and unexpected weight change.  Eyes: Negative for pain, redness and visual disturbance.  Respiratory: Negative for cough, chest tightness, shortness of breath and wheezing.   Cardiovascular: Negative for chest pain, palpitations and leg swelling.  Endocrine: Negative for cold intolerance, heat intolerance, polydipsia, polyphagia and polyuria.  Genitourinary: Negative for dysuria, frequency and difficulty urinating.  Neurological: Negative for dizziness, light-headedness, numbness and headaches.  All other systems reviewed and are negative.   History Past Medical History  Diagnosis Date  . GERD (gastroesophageal reflux disease)   . Hypertension   . Hyperlipidemia   . Diabetes mellitus     TYPE 2  . Pneumonia   . Sinusitis   . Acute on chronic appendicitis s/p lap appy 06/10/2012 06/10/2012  . Morbid obesity - BMI > 50 12/13/2012    She has past surgical history that includes Abdominal hysterectomy; Knee surgery (Right); Upper gastrointestinal endoscopy (04/14/2006); Colonoscopy (04/11/11); and laparoscopic appendectomy (06/10/2012).   Her family history includes Cancer (age of onset: 30) in her mother; Diabetes in her father and paternal grandmother; Hyperlipidemia in her brother and mother; Hypertension in her mother. There is no history of Colon cancer, Heart attack, or Sudden death.She reports that she has never smoked. She has never used smokeless tobacco. She reports that she does not drink alcohol or use illicit drugs.  Current Outpatient  Prescriptions on File Prior to Visit  Medication Sig Dispense Refill  . amLODipine (NORVASC) 10 MG tablet Take 1 tablet (10 mg total) by mouth daily. 30 tablet 0  . ciprofloxacin (CIPRO) 500 MG tablet Take 1 tablet (500 mg total) by mouth 2 (two) times daily. 12 tablet 0  . fluticasone (FLONASE) 50 MCG/ACT nasal spray Place 2 sprays into both nostrils daily as needed for allergies.    . furosemide (LASIX) 40 MG tablet Take 1 tablet (40 mg total) by mouth every other day.    . Insulin Pen Needle (NOVOFINE) 32G X 6 MM MISC Inject 1.8 mg into the skin daily. 100 each 3  . loratadine (CLARITIN) 10 MG tablet Take 1 tablet (10 mg total) by mouth daily as needed for allergies.    . metroNIDAZOLE (FLAGYL) 250 MG tablet Take 1 tablet (250 mg total) by mouth 3 (three) times daily. 18 tablet 0  . pantoprazole (PROTONIX) 40 MG tablet Take 2 tablets (80 mg total) by mouth daily. 180 tablet 3  . acetaminophen (TYLENOL) 325 MG tablet Take 2 tablets (650 mg total) by mouth every 6 (six) hours as needed for mild pain, moderate pain, fever or headache. (Patient not taking: Reported on 02/02/2015)    . Liraglutide (VICTOZA) 18 MG/3ML SOPN Inject 0.3 mLs (1.8 mg total) into the skin daily. (Patient not taking: Reported on 02/02/2015) 15 mL 0  . potassium chloride (K-DUR,KLOR-CON) 10 MEQ tablet Take 2 tablets (20 mEq total) by mouth daily. (Patient not taking: Reported on 02/02/2015)     No current facility-administered medications on file prior to visit.  Objective:  Objective Physical Exam  Constitutional: She is oriented to person, place, and time. She appears well-developed and well-nourished.  HENT:  Head: Normocephalic and atraumatic.  Eyes: Conjunctivae and EOM are normal.  Neck: Normal range of motion. Neck supple. No JVD present. Carotid bruit is not present. No thyromegaly present.  Cardiovascular: Normal rate, regular rhythm and normal heart sounds.   No murmur heard. Pulmonary/Chest: Effort normal  and breath sounds normal. No respiratory distress. She has no wheezes. She has no rales. She exhibits no tenderness.  Musculoskeletal: She exhibits no edema.  Neurological: She is alert and oriented to person, place, and time.  Psychiatric: She has a normal mood and affect. Her behavior is normal.   BP 140/76 mmHg  Pulse 76  Temp(Src) 98.1 F (36.7 C) (Oral)  Ht $R'5\' 6"'pf$  (1.676 m)  Wt 326 lb 3.2 oz (147.963 kg)  BMI 52.68 kg/m2  SpO2 99% Wt Readings from Last 3 Encounters:  02/02/15 326 lb 3.2 oz (147.963 kg)  01/25/15 338 lb (153.316 kg)  01/22/15 338 lb 6.4 oz (153.497 kg)     Lab Results  Component Value Date   WBC 3.1* 01/29/2015   HGB 12.8 01/29/2015   HCT 38.2 01/29/2015   PLT 237 01/29/2015   GLUCOSE 114* 01/29/2015   CHOL 207* 05/02/2014   TRIG 57.0 05/02/2014   HDL 69.80 05/02/2014   LDLDIRECT 129.1 10/17/2009   LDLCALC 126* 05/02/2014   ALT 15 01/25/2015   AST 22 01/25/2015   NA 140 01/29/2015   K 3.1* 01/29/2015   CL 103 01/29/2015   CREATININE 0.77 01/29/2015   BUN <5* 01/29/2015   CO2 29 01/29/2015   TSH 3.181 02/21/2014   INR 1.1* 10/16/2014   HGBA1C 6.2* 01/29/2015   MICROALBUR 0.58 10/22/2012    Ct Abdomen Pelvis W Contrast  01/25/2015   CLINICAL DATA:  Abdominal pain and distention for the past 3 weeks. Followup ascites.  EXAM: CT ABDOMEN AND PELVIS WITH CONTRAST  TECHNIQUE: Multidetector CT imaging of the abdomen and pelvis was performed using the standard protocol following bolus administration of intravenous contrast.  CONTRAST:  62mL OMNIPAQUE IOHEXOL 300 MG/ML SOLN, 150mL OMNIPAQUE IOHEXOL 300 MG/ML SOLN  COMPARISON:  Previous CT and ultrasound examinations, including the abdomen and pelvis CT dated 08/08/2014.  FINDINGS: The examination is somewhat limited by photon starvation due to the large size of the patient and the fact that she was image with her arms by her sides.  Small amount of free peritoneal fluid, with improvement since 08/08/2014.  The  liver, spleen, pancreas, gallbladder, adrenal glands, kidneys, urinary bladder and ureters are grossly normal. There is a small bowel loop in the right mid to lower abdomen and upper pelvis with diffuse wall thickening, with a maximum thickness of 10 mm. Mild adjacent mesenteric soft tissue stranding.  No colonic abnormalities. Mildly prominent retroperitoneal lymph nodes are unchanged, with no pathologically enlarged lymph nodes seen.  Surgically absent uterus. No adnexal masses are seen. Post appendectomy changes. The interstitial markings at the lung bases remain prominent. A small hiatal hernia is again demonstrated.  IMPRESSION: 1. Moderate diffuse wall thickening with mild adjacent mesenteric soft tissue stranding involving a small bowel loop in the right mid lower abdomen and upper pelvis. Differential considerations include infectious and inflammatory enteritis. Ischemic enteritis is less likely but not excluded. Small bowel lymphoma can also have this appearance. 2. Small amount of ascites, with improvement. 3. Small hiatal hernia. 4. Chronic interstitial lung disease or interstitial  edema at the lung bases.   Electronically Signed   By: Claudie Revering M.D.   On: 01/25/2015 14:47   US Abdomen Limited  01/26/2015   CLINICAL DATA:  Evaluate ascites for paracentesis  EXAM: LIMITED ABDOMEN ULTRASOUND FOR ASCITES  TECHNIQUE: Limited ultrasound survey for ascites was performed in all four abdominal quadrants.  COMPARISON:  01/25/2015  FINDINGS: Four quadrant survey of the abdomen was performed demonstrating a small volume of ascites within the right side of pelvis. There is insufficient volume of ascites for paracentesis.  IMPRESSION: 1. Small volume of ascites identified within the right side of pelvis.   Electronically Signed   By: Kerby Moors M.D.   On: 01/26/2015 16:24   Ir Fluoro Guide Cv Line Left  01/26/2015   INDICATION: Poor venous access. In need of intravenous access for medication  administration and blood draws.  EXAM: ULTRASOUND AND FLUOROSCOPIC GUIDED PICC LINE INSERTION  MEDICATIONS: None.  CONTRAST:  None  FLUOROSCOPY TIME:  12 seconds.  COMPLICATIONS: None immediate  TECHNIQUE: The procedure, risks, benefits, and alternatives were explained to the patient and informed written consent was obtained. A timeout was performed prior to the initiation of the procedure.  The left upper extremity was prepped with chlorhexidine in a sterile fashion, and a sterile drape was applied covering the operative field. Maximum barrier sterile technique with sterile gowns and gloves were used for the procedure. A timeout was performed prior to the initiation of the procedure. Local anesthesia was provided with 1% lidocaine.  Under direct ultrasound guidance, the left brachia vein was accessed with a micropuncture kit after the overlying soft tissues were anesthetized with 1% lidocaine. An ultrasound image was saved for documentation purposes. A guidewire was advanced to the level of the superior caval-atrial junction for measurement purposes and the PICC line was cut to length. A peel-away sheath was placed and a 52 cm, 5 [place], dual lumen was inserted to level of the superior caval-atrial junction. A post procedure spot fluoroscopic was obtained. The catheter easily aspirated and flushed and was sutured in place. A dressing was placed. The patient tolerated the procedure well without immediate post procedural complication.  FINDINGS: After catheter placement, the tip lies within the superior cavoatrial junction. The catheter aspirates and flushes normally and is ready for immediate use.  IMPRESSION: Successful ultrasound and fluoroscopic guided placement of a left brachial vein approach, 5 French, dual lumen PICC with tip at the superior caval-atrial junction. The PICC line is ready for immediate use.   Electronically Signed   By: Sandi Mariscal M.D.   On: 01/26/2015 17:45   Ir US Guide Vasc Access  Right  01/26/2015   INDICATION: Poor venous access. In need of intravenous access for medication administration and blood draws.  EXAM: ULTRASOUND AND FLUOROSCOPIC GUIDED PICC LINE INSERTION  MEDICATIONS: None.  CONTRAST:  None  FLUOROSCOPY TIME:  12 seconds.  COMPLICATIONS: None immediate  TECHNIQUE: The procedure, risks, benefits, and alternatives were explained to the patient and informed written consent was obtained. A timeout was performed prior to the initiation of the procedure.  The left upper extremity was prepped with chlorhexidine in a sterile fashion, and a sterile drape was applied covering the operative field. Maximum barrier sterile technique with sterile gowns and gloves were used for the procedure. A timeout was performed prior to the initiation of the procedure. Local anesthesia was provided with 1% lidocaine.  Under direct ultrasound guidance, the left brachia vein was accessed with a micropuncture  kit after the overlying soft tissues were anesthetized with 1% lidocaine. An ultrasound image was saved for documentation purposes. A guidewire was advanced to the level of the superior caval-atrial junction for measurement purposes and the PICC line was cut to length. A peel-away sheath was placed and a 52 cm, 5 [place], dual lumen was inserted to level of the superior caval-atrial junction. A post procedure spot fluoroscopic was obtained. The catheter easily aspirated and flushed and was sutured in place. A dressing was placed. The patient tolerated the procedure well without immediate post procedural complication.  FINDINGS: After catheter placement, the tip lies within the superior cavoatrial junction. The catheter aspirates and flushes normally and is ready for immediate use.  IMPRESSION: Successful ultrasound and fluoroscopic guided placement of a left brachial vein approach, 5 French, dual lumen PICC with tip at the superior caval-atrial junction. The PICC line is ready for immediate use.    Electronically Signed   By: Sandi Mariscal M.D.   On: 01/26/2015 17:45   Dg Chest Port 1 View  01/26/2015   CLINICAL DATA:  PICC line placement.  EXAM: PORTABLE CHEST 1 VIEW  COMPARISON:  May 09, 2007.  FINDINGS: The heart size and mediastinal contours are within normal limits. Both lungs are clear. No pneumothorax or pleural effusion is noted. No PICC line is noted. The visualized skeletal structures are unremarkable.  IMPRESSION: No acute cardiopulmonary abnormality seen.   Electronically Signed   By: Marijo Conception, M.D.   On: 01/26/2015 09:29     Assessment & Plan:  Plan I am having Tammy [name] maintain her pantoprazole, Insulin Pen Needle, Liraglutide, acetaminophen, loratadine, amLODipine, fluticasone, furosemide, ciprofloxacin, potassium chloride, metroNIDAZOLE, FUTURO SHEER SUPPORT HOSE, and glucose blood.  Meds ordered this encounter  Medications  . Elastic Bandages & Supports (FUTURO SHEER SUPPORT HOSE) MISC    Sig: 1 packet by Other route daily. 1 pair of support hose to wear daily    Dispense:  1 each    Refill:  0  . glucose blood test strip    Sig: Check blood sugar twice daily    Dispense:  100 each    Refill:  11    Onetouch ultra blue test strips    Problem List Items Addressed This Visit    Enteritis    ? Angioedema Ace stopped and pt put on norvasc Pt is also on soft diet--- she will advance diet slowly F/u GI Repeat labs today      Relevant Orders   Comp Met (CMET)   ANKLE EDEMA, CHRONIC   Relevant Medications   Elastic Bandages & Supports (FUTURO SHEER SUPPORT HOSE) MISC   Other Relevant Orders   CBC with Differential/Platelet    Other Visit Diagnoses    Cyst of ovary, unspecified laterality        Relevant Orders    CA 125    CEA    Generalized abdominal pain        Relevant Orders    Comp Met (CMET)    Diabetes mellitus type II, uncontrolled        Relevant Medications    glucose blood test strip       Follow-up: Return in about 3 weeks  (around 02/23/2015), or if symptoms worsen or fail to improve, for f/u.  Garnet Koyanagi, DO

## 2015-02-02 NOTE — Assessment & Plan Note (Signed)
Lab Results  Component Value Date   HGBA1C 6.2* 01/29/2015   Pt has been off victoza since hosp----  Glucose running under 100 per pt F/u 3 weeks--- just before gastric bypass.

## 2015-02-05 ENCOUNTER — Encounter: Payer: 59 | Attending: Surgery

## 2015-02-05 DIAGNOSIS — Z6841 Body Mass Index (BMI) 40.0 and over, adult: Secondary | ICD-10-CM | POA: Insufficient documentation

## 2015-02-05 DIAGNOSIS — Z713 Dietary counseling and surveillance: Secondary | ICD-10-CM | POA: Diagnosis not present

## 2015-02-05 NOTE — Progress Notes (Signed)
  Pre-Operative Nutrition Class:  Appt start time: 830   End time:  930.  Patient was seen on 02/05/2015 for Pre-Operative Bariatric Surgery Education at [facility].   Surgery date: 02/27/2015 Surgery type: RYGB Start weight at [facility]: 336 lbs on 02/11/2014 Weight today: 332.4 lbs  TANITA  BODY COMP RESULTS  02/05/15   BMI (kg/m^2) N/A   Fat Mass (lbs)    Fat Free Mass (lbs)    Total Body Water (lbs)    Samples given per MNT protocol. Patient educated on appropriate usage: Celebrate Multivitamin chew (orange - qty 1) Lot #: [id number] Exp: 07/2016  PB2 (chocolate - qty 1) Lot #: N/A Exp: 03/2015  Premier protein shake (vanilla - qty 1) Lot #: [id number] Exp: 05/2015  Unjury protein powder (unflavored - qty 1) Lot #: [id number] Exp: 11/2015  The following the learning objectives were met by the patient during this course:  Identify Pre-Op Dietary Goals and will begin 2 weeks pre-operatively  Identify appropriate sources of fluids and proteins   State protein recommendations and appropriate sources pre and post-operatively  Identify Post-Operative Dietary Goals and will follow for 2 weeks post-operatively  Identify appropriate multivitamin and calcium sources  Describe the need for physical activity post-operatively and will follow MD recommendations  State when to call healthcare provider regarding medication questions or post-operative complications  Handouts given during class include:  Pre-Op Bariatric Surgery Diet Handout  Protein Shake Handout  Post-Op Bariatric Surgery Nutrition Handout  BELT Program Information Flyer  Support Group Information Flyer  [facility] Bariatric Supplements Price List  Follow-Up Plan: Patient will follow-up at [facility] 2 weeks post operatively for diet advancement per MD.

## 2015-02-06 ENCOUNTER — Other Ambulatory Visit (INDEPENDENT_AMBULATORY_CARE_PROVIDER_SITE_OTHER): Payer: 59

## 2015-02-06 ENCOUNTER — Encounter: Payer: Self-pay | Admitting: Family Medicine

## 2015-02-06 ENCOUNTER — Ambulatory Visit (INDEPENDENT_AMBULATORY_CARE_PROVIDER_SITE_OTHER): Payer: 59 | Admitting: Family Medicine

## 2015-02-06 VITALS — BP 126/72 | HR 75 | Temp 98.0°F | Wt 336.2 lb

## 2015-02-06 DIAGNOSIS — K529 Noninfective gastroenteritis and colitis, unspecified: Secondary | ICD-10-CM

## 2015-02-06 DIAGNOSIS — R609 Edema, unspecified: Secondary | ICD-10-CM

## 2015-02-06 DIAGNOSIS — R1084 Generalized abdominal pain: Secondary | ICD-10-CM

## 2015-02-06 DIAGNOSIS — E785 Hyperlipidemia, unspecified: Secondary | ICD-10-CM

## 2015-02-06 DIAGNOSIS — N83209 Unspecified ovarian cyst, unspecified side: Secondary | ICD-10-CM

## 2015-02-06 DIAGNOSIS — Z01810 Encounter for preprocedural cardiovascular examination: Secondary | ICD-10-CM | POA: Diagnosis not present

## 2015-02-06 NOTE — Assessment & Plan Note (Signed)
Off meds  diet controlled

## 2015-02-06 NOTE — Patient Instructions (Signed)
Exercise to Lose Weight Exercise and a healthy diet may help you lose weight. Your doctor may suggest specific exercises. EXERCISE IDEAS AND TIPS  Choose low-cost things you enjoy doing, such as walking, bicycling, or exercising to workout videos.  Take stairs instead of the elevator.  Walk during your lunch break.  Park your car further away from work or school.  Go to a gym or an exercise class.  Start with 5 to 10 minutes of exercise each day. Build up to 30 minutes of exercise 4 to 6 days a week.  Wear shoes with good support and comfortable clothes.  Stretch before and after working out.  Work out until you breathe harder and your heart beats faster.  Drink extra water when you exercise.  Do not do so much that you hurt yourself, feel dizzy, or get very short of breath. Exercises that burn about 150 calories:  Running 1  miles in 15 minutes.  Playing volleyball for 45 to 60 minutes.  Washing and waxing a car for 45 to 60 minutes.  Playing touch football for 45 minutes.  Walking 1  miles in 35 minutes.  Pushing a stroller 1  miles in 30 minutes.  Playing basketball for 30 minutes.  Raking leaves for 30 minutes.  Bicycling 5 miles in 30 minutes.  Walking 2 miles in 30 minutes.  Dancing for 30 minutes.  Shoveling snow for 15 minutes.  Swimming laps for 20 minutes.  Walking up stairs for 15 minutes.  Bicycling 4 miles in 15 minutes.  Gardening for 30 to 45 minutes.  Jumping rope for 15 minutes.  Washing windows or floors for 45 to 60 minutes. Document Released: 05/24/2010 Document Revised: 07/14/2011 Document Reviewed: 05/24/2010 [employer] Patient Information 2015 [employer]. This information is not intended to replace advice given to you by your health care provider. Make sure you discuss any questions you have with your health care provider.  

## 2015-02-06 NOTE — Progress Notes (Signed)
Patient ID: Tammy [name], female    [name]: December 09, 1960  Age: 54 y.o. MRN: [id number]    Subjective:  Subjective HPI [name] presents for ov prior to gastric bypass. Pt states she just needs [name] to review her meds and discuss f/u after surgery.    Review of Systems  Constitutional: Negative for diaphoresis, appetite change, fatigue and unexpected weight change.  Eyes: Negative for pain, redness and visual disturbance.  Respiratory: Negative for cough, chest tightness, shortness of breath and wheezing.   Cardiovascular: Negative for chest pain, palpitations and leg swelling.  Endocrine: Negative for cold intolerance, heat intolerance, polydipsia, polyphagia and polyuria.  Genitourinary: Negative for dysuria, frequency and difficulty urinating.  Neurological: Negative for dizziness, light-headedness, numbness and headaches.    History Past Medical History  Diagnosis Date  . GERD (gastroesophageal reflux disease)   . Hypertension   . Hyperlipidemia   . Diabetes mellitus     TYPE 2  . Pneumonia   . Sinusitis   . Acute on chronic appendicitis s/p lap appy 06/10/2012 06/10/2012  . Morbid obesity - BMI > 50 12/13/2012    She has past surgical history that includes Abdominal hysterectomy; Knee surgery (Right); Upper gastrointestinal endoscopy (04/14/2006); Colonoscopy (04/11/11); and laparoscopic appendectomy (06/10/2012).   Her family history includes Cancer (age of onset: 51) in her mother; Diabetes in her father and paternal grandmother; Hyperlipidemia in her brother and mother; Hypertension in her mother. There is no history of Colon cancer, Heart attack, or Sudden death.She reports that she has never smoked. She has never used smokeless tobacco. She reports that she does not drink alcohol or use illicit drugs.  Current Outpatient Prescriptions on File Prior to Visit  Medication Sig Dispense Refill  . amLODipine (NORVASC) 10 MG tablet Take 1 tablet (10 mg total) by mouth daily. 30  tablet 0  . Elastic Bandages & Supports (FUTURO SHEER SUPPORT HOSE) MISC 1 packet by Other route daily. 1 pair of support hose to wear daily 1 each 0  . fluticasone (FLONASE) 50 MCG/ACT nasal spray Place 2 sprays into both nostrils daily as needed for allergies.    . furosemide (LASIX) 40 MG tablet Take 1 tablet (40 mg total) by mouth every other day.    Marland Kitchen glucose blood test strip Check blood sugar twice daily 100 each 11  . loratadine (CLARITIN) 10 MG tablet Take 1 tablet (10 mg total) by mouth daily as needed for allergies.    . pantoprazole (PROTONIX) 40 MG tablet Take 2 tablets (80 mg total) by mouth daily. 180 tablet 3  . potassium chloride (K-DUR,KLOR-CON) 10 MEQ tablet Take 2 tablets (20 mEq total) by mouth daily.     No current facility-administered medications on file prior to visit.     Objective:  Objective Physical Exam  Constitutional: She is oriented to person, place, and time. She appears well-developed and well-nourished.  HENT:  Head: Normocephalic and atraumatic.  Eyes: Conjunctivae and EOM are normal.  Neck: Normal range of motion. Neck supple. No JVD present. Carotid bruit is not present. No thyromegaly present.  Cardiovascular: Normal rate, regular rhythm and normal heart sounds.   No murmur heard. Pulmonary/Chest: Effort normal and breath sounds normal. No respiratory distress. She has no wheezes. She has no rales. She exhibits no tenderness.  Musculoskeletal: She exhibits no edema.  Neurological: She is alert and oriented to person, place, and time.  Psychiatric: She has a normal mood and affect. Her behavior is normal. Judgment and thought  content normal.   BP 126/72 mmHg  Pulse 75  Temp(Src) 98 F (36.7 C) (Oral)  Wt 336 lb 3.2 oz (152.499 kg)  SpO2 100% Wt Readings from Last 3 Encounters:  02/06/15 336 lb 3.2 oz (152.499 kg)  02/05/15 332 lb 6.4 oz (150.776 kg)  02/02/15 326 lb 3.2 oz (147.963 kg)     Lab Results  Component Value Date   WBC 3.1*  01/29/2015   HGB 12.8 01/29/2015   HCT 38.2 01/29/2015   PLT 237 01/29/2015   GLUCOSE 114* 01/29/2015   CHOL 207* 05/02/2014   TRIG 57.0 05/02/2014   HDL 69.80 05/02/2014   LDLDIRECT 129.1 10/17/2009   LDLCALC 126* 05/02/2014   ALT 15 01/25/2015   AST 22 01/25/2015   NA 140 01/29/2015   K 3.1* 01/29/2015   CL 103 01/29/2015   CREATININE 0.77 01/29/2015   BUN <5* 01/29/2015   CO2 29 01/29/2015   TSH 3.181 02/21/2014   INR 1.1* 10/16/2014   HGBA1C 6.2* 01/29/2015   MICROALBUR 0.58 10/22/2012    Ct Abdomen Pelvis W Contrast  01/25/2015   CLINICAL DATA:  Abdominal pain and distention for the past 3 weeks. Followup ascites.  EXAM: CT ABDOMEN AND PELVIS WITH CONTRAST  TECHNIQUE: Multidetector CT imaging of the abdomen and pelvis was performed using the standard protocol following bolus administration of intravenous contrast.  CONTRAST:  45m OMNIPAQUE IOHEXOL 300 MG/ML SOLN, 1033mOMNIPAQUE IOHEXOL 300 MG/ML SOLN  COMPARISON:  Previous CT and ultrasound examinations, including the abdomen and pelvis CT dated 08/08/2014.  FINDINGS: The examination is somewhat limited by photon starvation due to the large size of the patient and the fact that she was image with her arms by her sides.  Small amount of free peritoneal fluid, with improvement since 08/08/2014.  The liver, spleen, pancreas, gallbladder, adrenal glands, kidneys, urinary bladder and ureters are grossly normal. There is a small bowel loop in the right mid to lower abdomen and upper pelvis with diffuse wall thickening, with a maximum thickness of 10 mm. Mild adjacent mesenteric soft tissue stranding.  No colonic abnormalities. Mildly prominent retroperitoneal lymph nodes are unchanged, with no pathologically enlarged lymph nodes seen.  Surgically absent uterus. No adnexal masses are seen. Post appendectomy changes. The interstitial markings at the lung bases remain prominent. A small hiatal hernia is again demonstrated.  IMPRESSION: 1.  Moderate diffuse wall thickening with mild adjacent mesenteric soft tissue stranding involving a small bowel loop in the right mid lower abdomen and upper pelvis. Differential considerations include infectious and inflammatory enteritis. Ischemic enteritis is less likely but not excluded. Small bowel lymphoma can also have this appearance. 2. Small amount of ascites, with improvement. 3. Small hiatal hernia. 4. Chronic interstitial lung disease or interstitial edema at the lung bases.   Electronically Signed   By: StClaudie Revering.D.   On: 01/25/2015 14:47   UsKoreabdomen Limited  01/26/2015   CLINICAL DATA:  Evaluate ascites for paracentesis  EXAM: LIMITED ABDOMEN ULTRASOUND FOR ASCITES  TECHNIQUE: Limited ultrasound survey for ascites was performed in all four abdominal quadrants.  COMPARISON:  01/25/2015  FINDINGS: Four quadrant survey of the abdomen was performed demonstrating a small volume of ascites within the right side of pelvis. There is insufficient volume of ascites for paracentesis.  IMPRESSION: 1. Small volume of ascites identified within the right side of pelvis.   Electronically Signed   By: TaKerby Moors.D.   On: 01/26/2015 16:24   Ir Fluoro Guide  Cv Line Left  01/26/2015   INDICATION: Poor venous access. In need of intravenous access for medication administration and blood draws.  EXAM: ULTRASOUND AND FLUOROSCOPIC GUIDED PICC LINE INSERTION  MEDICATIONS: None.  CONTRAST:  None  FLUOROSCOPY TIME:  12 seconds.  COMPLICATIONS: None immediate  TECHNIQUE: The procedure, risks, benefits, and alternatives were explained to the patient and informed written consent was obtained. A timeout was performed prior to the initiation of the procedure.  The left upper extremity was prepped with chlorhexidine in a sterile fashion, and a sterile drape was applied covering the operative field. Maximum barrier sterile technique with sterile gowns and gloves were used for the procedure. A timeout was performed prior  to the initiation of the procedure. Local anesthesia was provided with 1% lidocaine.  Under direct ultrasound guidance, the left brachia vein was accessed with a micropuncture kit after the overlying soft tissues were anesthetized with 1% lidocaine. An ultrasound image was saved for documentation purposes. A guidewire was advanced to the level of the superior caval-atrial junction for measurement purposes and the PICC line was cut to length. A peel-away sheath was placed and a 52 cm, 5 [place], dual lumen was inserted to level of the superior caval-atrial junction. A post procedure spot fluoroscopic was obtained. The catheter easily aspirated and flushed and was sutured in place. A dressing was placed. The patient tolerated the procedure well without immediate post procedural complication.  FINDINGS: After catheter placement, the tip lies within the superior cavoatrial junction. The catheter aspirates and flushes normally and is ready for immediate use.  IMPRESSION: Successful ultrasound and fluoroscopic guided placement of a left brachial vein approach, 5 French, dual lumen PICC with tip at the superior caval-atrial junction. The PICC line is ready for immediate use.   Electronically Signed   By: Sandi Mariscal M.D.   On: 01/26/2015 17:45   Ir US Guide Vasc Access Right  01/26/2015   INDICATION: Poor venous access. In need of intravenous access for medication administration and blood draws.  EXAM: ULTRASOUND AND FLUOROSCOPIC GUIDED PICC LINE INSERTION  MEDICATIONS: None.  CONTRAST:  None  FLUOROSCOPY TIME:  12 seconds.  COMPLICATIONS: None immediate  TECHNIQUE: The procedure, risks, benefits, and alternatives were explained to the patient and informed written consent was obtained. A timeout was performed prior to the initiation of the procedure.  The left upper extremity was prepped with chlorhexidine in a sterile fashion, and a sterile drape was applied covering the operative field. Maximum barrier sterile  technique with sterile gowns and gloves were used for the procedure. A timeout was performed prior to the initiation of the procedure. Local anesthesia was provided with 1% lidocaine.  Under direct ultrasound guidance, the left brachia vein was accessed with a micropuncture kit after the overlying soft tissues were anesthetized with 1% lidocaine. An ultrasound image was saved for documentation purposes. A guidewire was advanced to the level of the superior caval-atrial junction for measurement purposes and the PICC line was cut to length. A peel-away sheath was placed and a 52 cm, 5 [place], dual lumen was inserted to level of the superior caval-atrial junction. A post procedure spot fluoroscopic was obtained. The catheter easily aspirated and flushed and was sutured in place. A dressing was placed. The patient tolerated the procedure well without immediate post procedural complication.  FINDINGS: After catheter placement, the tip lies within the superior cavoatrial junction. The catheter aspirates and flushes normally and is ready for immediate use.  IMPRESSION: Successful ultrasound and  fluoroscopic guided placement of a left brachial vein approach, 5 [place], dual lumen PICC with tip at the superior caval-atrial junction. The PICC line is ready for immediate use.   Electronically Signed   By: Sandi Mariscal M.D.   On: 01/26/2015 17:45   Dg Chest Port 1 View  01/26/2015   CLINICAL DATA:  PICC line placement.  EXAM: PORTABLE CHEST 1 VIEW  COMPARISON:  May 09, 2007.  FINDINGS: The heart size and mediastinal contours are within normal limits. Both lungs are clear. No pneumothorax or pleural effusion is noted. No PICC line is noted. The visualized skeletal structures are unremarkable.  IMPRESSION: No acute cardiopulmonary abnormality seen.   Electronically Signed   By: Marijo Conception, M.D.   On: 01/26/2015 09:29     Assessment & Plan:  Plan I have discontinued Ms. Parady's Insulin Pen Needle, Liraglutide,  acetaminophen, ciprofloxacin, and metroNIDAZOLE. I am also having her maintain her pantoprazole, loratadine, amLODipine, fluticasone, furosemide, potassium chloride, FUTURO SHEER SUPPORT HOSE, and glucose blood.  No orders of the defined types were placed in this encounter.    Problem List Items Addressed This Visit    None    Visit Diagnoses    Pre-operative cardiovascular examination    -  Primary    Relevant Orders    EKG 12-Lead (Completed)       Follow-up: Return in about 4 weeks (around 03/06/2015), or if symptoms worsen or fail to improve, for hypertension.  Garnet Koyanagi, DO

## 2015-02-06 NOTE — Assessment & Plan Note (Signed)
con't  Diet and exercise as tolerated Pt having lap band end of month

## 2015-02-06 NOTE — Assessment & Plan Note (Signed)
Check labs Lab Results  Component Value Date   CHOL 207* 05/02/2014   HDL 69.80 05/02/2014   LDLCALC 126* 05/02/2014   LDLDIRECT 129.1 10/17/2009   TRIG 57.0 05/02/2014   CHOLHDL 3 05/02/2014

## 2015-02-06 NOTE — Progress Notes (Signed)
Pre visit review using our clinic review tool, if applicable. No additional management support is needed unless otherwise documented below in the visit note. 

## 2015-02-07 LAB — CBC WITH DIFFERENTIAL/PLATELET
Basophils Absolute: 0 10*3/uL (ref 0.0–0.1)
Basophils Relative: 1 % (ref 0.0–3.0)
EOS PCT: 2.7 % (ref 0.0–5.0)
Eosinophils Absolute: 0.1 10*3/uL (ref 0.0–0.7)
HEMATOCRIT: 40.5 % (ref 36.0–46.0)
HEMOGLOBIN: 13.5 g/dL (ref 12.0–15.0)
LYMPHS PCT: 38.2 % (ref 12.0–46.0)
Lymphs Abs: 1.4 10*3/uL (ref 0.7–4.0)
MCHC: 33.3 g/dL (ref 30.0–36.0)
MCV: 85.8 fl (ref 78.0–100.0)
MONO ABS: 0.2 10*3/uL (ref 0.1–1.0)
Monocytes Relative: 7 % (ref 3.0–12.0)
Neutro Abs: 1.8 10*3/uL (ref 1.4–7.7)
Neutrophils Relative %: 51.1 % (ref 43.0–77.0)
Platelets: 295 10*3/uL (ref 150.0–400.0)
RBC: 4.72 Mil/uL (ref 3.87–5.11)
RDW: 15 % (ref 11.5–15.5)
WBC: 3.6 10*3/uL — AB (ref 4.0–10.5)

## 2015-02-07 LAB — COMPREHENSIVE METABOLIC PANEL
ALBUMIN: 4 g/dL (ref 3.5–5.2)
ALK PHOS: 85 U/L (ref 39–117)
ALT: 26 U/L (ref 0–35)
AST: 19 U/L (ref 0–37)
BUN: 14 mg/dL (ref 6–23)
CO2: 28 mEq/L (ref 19–32)
Calcium: 9 mg/dL (ref 8.4–10.5)
Chloride: 106 mEq/L (ref 96–112)
Creatinine, Ser: 1.02 mg/dL (ref 0.40–1.20)
GFR: 72.62 mL/min (ref >60.00)
Glucose, Bld: 114 mg/dL — ABNORMAL HIGH (ref 70–99)
POTASSIUM: 3.8 meq/L (ref 3.5–5.1)
SODIUM: 141 meq/L (ref 135–145)
TOTAL PROTEIN: 7.4 g/dL (ref 6.0–8.3)
Total Bilirubin: 0.3 mg/dL (ref 0.2–1.2)

## 2015-02-07 LAB — CA 125: CA 125: 51 U/mL — AB (ref <35)

## 2015-02-07 LAB — CEA

## 2015-02-07 NOTE — Progress Notes (Signed)
Please put orders in Epic surgery 02-27-15 pre op 02-19-15 Thanks

## 2015-02-08 ENCOUNTER — Telehealth: Payer: Self-pay | Admitting: Family Medicine

## 2015-02-08 ENCOUNTER — Telehealth: Payer: Self-pay | Admitting: *Deleted

## 2015-02-08 NOTE — Telephone Encounter (Signed)
To MD and JG.     KP

## 2015-02-08 NOTE — Telephone Encounter (Signed)
RTW auth received via fax from [employer]. Forwarded to Dr. Etter Sjogren. JG//CMA

## 2015-02-08 NOTE — Telephone Encounter (Signed)
i filled it out already

## 2015-02-08 NOTE — Telephone Encounter (Signed)
Caller name: Chavon Lucarelli   Relationship to patient: Self   Can be reached:[phone]  Reason for call: pt says that her employer is sending over a form for her leave of absence at work. She says that she is to return on the 10th. She says that you can fill it out and either fax back to employer or she will come in and pick up and take it to them instead.    Please advise.

## 2015-02-09 ENCOUNTER — Other Ambulatory Visit: Payer: Self-pay | Admitting: Family Medicine

## 2015-02-09 DIAGNOSIS — R971 Elevated cancer antigen 125 [CA 125]: Secondary | ICD-10-CM

## 2015-02-09 NOTE — Telephone Encounter (Signed)
Completed form faxed successfully to [employer]. JG//CMA

## 2015-02-09 NOTE — Telephone Encounter (Signed)
See other phone note

## 2015-02-12 NOTE — Telephone Encounter (Addendum)
Relation to TF:TDDU Call back number: [phone]   Reason for call:   [employer] will fax over questionnaire and as per insurance questionnaire needs to be filled out and all paperwork needs to be re faxed. Insurance company will fax to [phone] or [phone].

## 2015-02-13 ENCOUNTER — Telehealth: Payer: Self-pay | Admitting: *Deleted

## 2015-02-13 NOTE — Telephone Encounter (Signed)
We just received the medical questionnaire today and Dr. Etter Sjogren has not completed it. Our form turnaround is 5-7 business days.

## 2015-02-13 NOTE — Telephone Encounter (Signed)
Pt states that insurance did not receive the info faxed below. Please resend it to them along with the other form they told her they were sending [name] yesterday.

## 2015-02-13 NOTE — Telephone Encounter (Signed)
Tammy [name] medical questionnaire received placed on [place] [facility]. Desk

## 2015-02-13 NOTE — Telephone Encounter (Signed)
Opened in error

## 2015-02-14 ENCOUNTER — Encounter: Payer: Self-pay | Admitting: Physician Assistant

## 2015-02-14 ENCOUNTER — Ambulatory Visit (INDEPENDENT_AMBULATORY_CARE_PROVIDER_SITE_OTHER): Payer: 59 | Admitting: Physician Assistant

## 2015-02-14 VITALS — BP 108/70 | HR 72 | Ht 66.0 in | Wt 331.0 lb

## 2015-02-14 DIAGNOSIS — K529 Noninfective gastroenteritis and colitis, unspecified: Secondary | ICD-10-CM | POA: Diagnosis not present

## 2015-02-14 NOTE — Telephone Encounter (Signed)
Medical questionnaire, RTW Josem Kaufmann, and Disability Claim forms all faxed to [employer] successfully at [phone]. JG//CMA

## 2015-02-14 NOTE — Progress Notes (Signed)
Patient ID: [id number] Tammy [name], female   [name]: Nov 13, 1960, 54 y.o.   MRN: [id number]     History of Present Illness: Tammy [name] is a pleasant 54 year old female known to Dr. Carlean Purl. She has a past medical history significant for hypertension, type 2 diabetes, GERD, and chronic intermittent abdominal pain. She was recently admitted to the hospital 01/25/2015 through 01/29/2015. She presented with 5 days of severe colicky abdominal pain, vomiting, and inability to take anything by mouth. She had no associated fever or diarrhea. She had been scheduled for a paracentesis due to a history of large volume ascites of unknown source but ended up going to the emergency room where CT showed a new focal segment of small bowel thickening and some ascites. She had not had a bowel movement for 4 days prior to admission. Review of records had revealed that she had a normal EGD and normal small bowel follow-through IR was consult in for a diagnostic paracentesis but there was not enough fluid to tap. She was treated with bowel rest, IV fluids, supportive treatment, and empirically started on Cipro and Flagyl. She had a CT enterography that showed circumferential wall thickening of the mid small bowel within the central abdomen with associated mesenteric and pelvic free fluid, suspecting infectious versus inflammatory etiology. Her diet was gradually advanced and the patient made significant improvement she was discharged home with a 10 day course of Cipro and Flagyl and returns for follow-up. She states since leaving the hospital she feels well she has had no further nausea or vomiting. She is moving her bowels regularly. She has minimal abdominal pain if any. She states she is scheduled to see Dr. Hassell Done of [facility] surgery tomorrow and is slated to undergo gastric bypass surgery on October 25.   Past Medical History  Diagnosis Date  . GERD (gastroesophageal reflux disease)   . Hypertension   .  Hyperlipidemia   . Diabetes mellitus     TYPE 2  . Pneumonia   . Sinusitis   . Acute on chronic appendicitis s/p lap appy 06/10/2012 06/10/2012  . Morbid obesity - BMI > 50 12/13/2012    Past Surgical History  Procedure Laterality Date  . Abdominal hysterectomy    . Knee surgery Right      KNEE SURGERY   . Upper gastrointestinal endoscopy  04/14/2006    Normal  . Colonoscopy  04/11/11    5 mm polyp removed but not recovered  . Laparoscopic appendectomy  06/10/2012    Procedure: APPENDECTOMY LAPAROSCOPIC;  Surgeon: Adin Hector, MD;  Location: [facility];  Service: General;  Laterality: N/A;   Family History  Problem Relation Age of Onset  . Hyperlipidemia Mother   . Hypertension Mother   . Cancer Mother 10    hodgkins lymphoma  . Diabetes Father   . Hyperlipidemia Brother   . Colon cancer Neg Hx   . Heart attack Neg Hx   . Sudden death Neg Hx   . Diabetes Paternal Grandmother    Social History  Substance Use Topics  . Smoking status: Never Smoker   . Smokeless tobacco: Never Used  . Alcohol Use: No   Current Outpatient Prescriptions  Medication Sig Dispense Refill  . amLODipine (NORVASC) 10 MG tablet Take 1 tablet (10 mg total) by mouth daily. 30 tablet 0  . Elastic Bandages & Supports (FUTURO SHEER SUPPORT HOSE) MISC 1 packet by Other route daily. 1 pair of support hose to wear daily 1 each 0  .  fluticasone (FLONASE) 50 MCG/ACT nasal spray Place 2 sprays into both nostrils daily as needed for allergies.    . furosemide (LASIX) 40 MG tablet Take 1 tablet (40 mg total) by mouth every other day.    Marland Kitchen glucose blood test strip Check blood sugar twice daily 100 each 11  . loratadine (CLARITIN) 10 MG tablet Take 1 tablet (10 mg total) by mouth daily as needed for allergies.    . pantoprazole (PROTONIX) 40 MG tablet Take 2 tablets (80 mg total) by mouth daily. 180 tablet 3  . potassium chloride (K-DUR,KLOR-CON) 10 MEQ tablet Take 2 tablets (20 mEq total) by mouth daily.     No  current facility-administered medications for this visit.   Allergies  Allergen Reactions  . Ace Inhibitors Anaphylaxis    Bowel angioedema  . Cheese Nausea And Vomiting  . Red Dye Nausea And Vomiting  . [name] Flash [Fish Allergy] Nausea And Vomiting     Review of Systems: Gen: Denies any fever, chills, sweats, anorexia, fatigue, weakness, malaise, weight loss, and sleep disorder CV: Denies chest pain, angina, palpitations, syncope, orthopnea, PND, peripheral edema, and claudication. Resp: Denies dyspnea at rest, dyspnea with exercise, cough, sputum, wheezing, coughing up blood, and pleurisy. GI: Denies vomiting blood, jaundice, and fecal incontinence.   Denies dysphagia or odynophagia. GU : Denies urinary burning, blood in urine, urinary frequency, urinary hesitancy, nocturnal urination, and urinary incontinence. MS: Denies joint pain, limitation of movement, and swelling, stiffness, low back pain, extremity pain. Denies muscle weakness, cramps, atrophy.  Derm: Denies rash, itching, dry skin, hives, moles, warts, or unhealing ulcers.  Psych: Denies depression, anxiety, memory loss, suicidal ideation, hallucinations, paranoia, and confusion. Heme: Denies bruising, bleeding, and enlarged lymph nodes. Neuro:  Denies any headaches, dizziness, paresthesia Endo:  Denies any problems with DM, thyroid, adrenal    Studies:   US Transvaginal Non-ob  01/22/2015  CLINICAL DATA:  Known LEFT ovarian cyst, increased pain on LEFT side since Saturday, hypertension, diabetes mellitus EXAM: TRANSABDOMINAL AND TRANSVAGINAL ULTRASOUND OF PELVIS TECHNIQUE: Both transabdominal and transvaginal ultrasound examinations of the pelvis were performed. Transabdominal technique was performed for global imaging of the pelvis including uterus, ovaries, adnexal regions, and pelvic cul-de-sac. It was necessary to proceed with endovaginal exam following the transabdominal exam to visualize the ovaries. COMPARISON:  None  FINDINGS: Uterus Surgically absent Endometrium N/A Right ovary Not visualized on either transabdominal or endovaginal imaging question related to atrophy versus obscuration by bowel Left ovary Measurements: 4.1 x 3.4 x 3.5 cm. Small simple appearing cysts measuring 2.5 x 1.3 x 2.4 cm and 2.4 x 2.0 x 1.8 cm. No solid mass. Other findings Large amount of free pelvic fluid/ascites, simple in character. No other masses. IMPRESSION: Large amount of free pelvic fluid/ ascites. Small simple appearing LEFT ovarian cyst. Surgical absence of uterus with nonvisualization of RIGHT ovary. Electronically Signed   By: Lavonia Dana M.D.   On: 01/22/2015 18:17   US Pelvis Complete  01/22/2015  CLINICAL DATA:  Known LEFT ovarian cyst, increased pain on LEFT side since Saturday, hypertension, diabetes mellitus EXAM: TRANSABDOMINAL AND TRANSVAGINAL ULTRASOUND OF PELVIS TECHNIQUE: Both transabdominal and transvaginal ultrasound examinations of the pelvis were performed. Transabdominal technique was performed for global imaging of the pelvis including uterus, ovaries, adnexal regions, and pelvic cul-de-sac. It was necessary to proceed with endovaginal exam following the transabdominal exam to visualize the ovaries. COMPARISON:  None FINDINGS: Uterus Surgically absent Endometrium N/A Right ovary Not visualized on either transabdominal or endovaginal imaging  question related to atrophy versus obscuration by bowel Left ovary Measurements: 4.1 x 3.4 x 3.5 cm. Small simple appearing cysts measuring 2.5 x 1.3 x 2.4 cm and 2.4 x 2.0 x 1.8 cm. No solid mass. Other findings Large amount of free pelvic fluid/ascites, simple in character. No other masses. IMPRESSION: Large amount of free pelvic fluid/ ascites. Small simple appearing LEFT ovarian cyst. Surgical absence of uterus with nonvisualization of RIGHT ovary. Electronically Signed   By: Lavonia Dana M.D.   On: 01/22/2015 18:17   Ct Abdomen Pelvis W Contrast  01/25/2015  CLINICAL DATA:   Abdominal pain and distention for the past 3 weeks. Followup ascites. EXAM: CT ABDOMEN AND PELVIS WITH CONTRAST TECHNIQUE: Multidetector CT imaging of the abdomen and pelvis was performed using the standard protocol following bolus administration of intravenous contrast. CONTRAST:  103m OMNIPAQUE IOHEXOL 300 MG/ML SOLN, 1066mOMNIPAQUE IOHEXOL 300 MG/ML SOLN COMPARISON:  Previous CT and ultrasound examinations, including the abdomen and pelvis CT dated 08/08/2014. FINDINGS: The examination is somewhat limited by photon starvation due to the large size of the patient and the fact that she was image with her arms by her sides. Small amount of free peritoneal fluid, with improvement since 08/08/2014. The liver, spleen, pancreas, gallbladder, adrenal glands, kidneys, urinary bladder and ureters are grossly normal. There is a small bowel loop in the right mid to lower abdomen and upper pelvis with diffuse wall thickening, with a maximum thickness of 10 mm. Mild adjacent mesenteric soft tissue stranding. No colonic abnormalities. Mildly prominent retroperitoneal lymph nodes are unchanged, with no pathologically enlarged lymph nodes seen. Surgically absent uterus. No adnexal masses are seen. Post appendectomy changes. The interstitial markings at the lung bases remain prominent. A small hiatal hernia is again demonstrated. IMPRESSION: 1. Moderate diffuse wall thickening with mild adjacent mesenteric soft tissue stranding involving a small bowel loop in the right mid lower abdomen and upper pelvis. Differential considerations include infectious and inflammatory enteritis. Ischemic enteritis is less likely but not excluded. Small bowel lymphoma can also have this appearance. 2. Small amount of ascites, with improvement. 3. Small hiatal hernia. 4. Chronic interstitial lung disease or interstitial edema at the lung bases. Electronically Signed   By: StClaudie Revering.D.   On: 01/25/2015 14:47   UsKoreabdomen Limited  01/26/2015   CLINICAL DATA:  Evaluate ascites for paracentesis EXAM: LIMITED ABDOMEN ULTRASOUND FOR ASCITES TECHNIQUE: Limited ultrasound survey for ascites was performed in all four abdominal quadrants. COMPARISON:  01/25/2015 FINDINGS: Four quadrant survey of the abdomen was performed demonstrating a small volume of ascites within the right side of pelvis. There is insufficient volume of ascites for paracentesis. IMPRESSION: 1. Small volume of ascites identified within the right side of pelvis. Electronically Signed   By: TaKerby Moors.D.   On: 01/26/2015 16:24   Ir Fluoro Guide Cv Line Left  01/26/2015  INDICATION: Poor venous access. In need of intravenous access for medication administration and blood draws. EXAM: ULTRASOUND AND FLUOROSCOPIC GUIDED PICC LINE INSERTION MEDICATIONS: None. CONTRAST:  None FLUOROSCOPY TIME:  12 seconds. COMPLICATIONS: None immediate TECHNIQUE: The procedure, risks, benefits, and alternatives were explained to the patient and informed written consent was obtained. A timeout was performed prior to the initiation of the procedure. The left upper extremity was prepped with chlorhexidine in a sterile fashion, and a sterile drape was applied covering the operative field. Maximum barrier sterile technique with sterile gowns and gloves were used for the procedure. A timeout was performed  prior to the initiation of the procedure. Local anesthesia was provided with 1% lidocaine. Under direct ultrasound guidance, the left brachia vein was accessed with a micropuncture kit after the overlying soft tissues were anesthetized with 1% lidocaine. An ultrasound image was saved for documentation purposes. A guidewire was advanced to the level of the superior caval-atrial junction for measurement purposes and the PICC line was cut to length. A peel-away sheath was placed and a 52 cm, 5 [place], dual lumen was inserted to level of the superior caval-atrial junction. A post procedure spot fluoroscopic was  obtained. The catheter easily aspirated and flushed and was sutured in place. A dressing was placed. The patient tolerated the procedure well without immediate post procedural complication. FINDINGS: After catheter placement, the tip lies within the superior cavoatrial junction. The catheter aspirates and flushes normally and is ready for immediate use. IMPRESSION: Successful ultrasound and fluoroscopic guided placement of a left brachial vein approach, 5 French, dual lumen PICC with tip at the superior caval-atrial junction. The PICC line is ready for immediate use. Electronically Signed   By: Sandi Mariscal M.D.   On: 01/26/2015 17:45   Ir US Guide Vasc Access Right  01/26/2015  INDICATION: Poor venous access. In need of intravenous access for medication administration and blood draws. EXAM: ULTRASOUND AND FLUOROSCOPIC GUIDED PICC LINE INSERTION MEDICATIONS: None. CONTRAST:  None FLUOROSCOPY TIME:  12 seconds. COMPLICATIONS: None immediate TECHNIQUE: The procedure, risks, benefits, and alternatives were explained to the patient and informed written consent was obtained. A timeout was performed prior to the initiation of the procedure. The left upper extremity was prepped with chlorhexidine in a sterile fashion, and a sterile drape was applied covering the operative field. Maximum barrier sterile technique with sterile gowns and gloves were used for the procedure. A timeout was performed prior to the initiation of the procedure. Local anesthesia was provided with 1% lidocaine. Under direct ultrasound guidance, the left brachia vein was accessed with a micropuncture kit after the overlying soft tissues were anesthetized with 1% lidocaine. An ultrasound image was saved for documentation purposes. A guidewire was advanced to the level of the superior caval-atrial junction for measurement purposes and the PICC line was cut to length. A peel-away sheath was placed and a 52 cm, 5 [place], dual lumen was inserted to level  of the superior caval-atrial junction. A post procedure spot fluoroscopic was obtained. The catheter easily aspirated and flushed and was sutured in place. A dressing was placed. The patient tolerated the procedure well without immediate post procedural complication. FINDINGS: After catheter placement, the tip lies within the superior cavoatrial junction. The catheter aspirates and flushes normally and is ready for immediate use. IMPRESSION: Successful ultrasound and fluoroscopic guided placement of a left brachial vein approach, 5 French, dual lumen PICC with tip at the superior caval-atrial junction. The PICC line is ready for immediate use. Electronically Signed   By: Sandi Mariscal M.D.   On: 01/26/2015 17:45   Ct Entero Abd/pelvis W/cm  01/27/2015  CLINICAL DATA:  Patient with worsening abdominal pain. Prior abnormal CT of the abdomen. EXAM: CT ABDOMEN AND PELVIS WITH CONTRAST (ENTEROGRAPHY) TECHNIQUE: Multidetector CT of the abdomen and pelvis during bolus administration of intravenous contrast. Negative oral contrast VoLumen was given. CONTRAST:  100 cc Omnipaque 300 COMPARISON:  CT abdomen pelvis 01/25/2015 FINDINGS: Lower chest: Normal heart size. Dependent atelectasis within the bilateral lower lobes. Hepatobiliary: The liver is normal in size and contour. No focal hepatic lesion. Probable small amount  of sludge within the gallbladder lumen. No intrahepatic or extrahepatic biliary ductal dilatation. Pancreas: Unremarkable Spleen: Multiple calcified granulomata in the spleen. Adrenals/Urinary Tract: Adrenal glands are normal. Kidneys enhance symmetrically with contrast. Urinary bladder is unremarkable. Stomach/Bowel: There is persistent circumferential wall thickening of the mid and distal small bowel, predominantly within the central abdomen. Persistent mesenteric free fluid. No free intraperitoneal air. Oral contrast material is demonstrated within the colon. Vascular/Lymphatic: Normal caliber abdominal  aorta. No retroperitoneal lymphadenopathy. Interval increase in free fluid within the pelvis. Persistent perihepatic free fluid. Other: None Musculoskeletal: Lower thoracic and lumbar spine degenerative changes. No aggressive or acute appearing osseous lesions. IMPRESSION: Grossly unchanged circumferential wall thickening of the mid small bowel within the central abdomen with associated mesenteric and pelvic free fluid. Findings are favored to represent a severe infectious or possibly inflammatory enteritis given appearance over time. Ischemic etiology is not favored however not entirely excluded. Interval increase in free fluid predominately within the pelvis. Electronically Signed   By: Lovey Newcomer M.D.   On: 01/27/2015 12:31   Dg Chest Port 1 View  01/26/2015  CLINICAL DATA:  PICC line placement. EXAM: PORTABLE CHEST 1 VIEW COMPARISON:  May 09, 2007. FINDINGS: The heart size and mediastinal contours are within normal limits. Both lungs are clear. No pneumothorax or pleural effusion is noted. No PICC line is noted. The visualized skeletal structures are unremarkable. IMPRESSION: No acute cardiopulmonary abnormality seen. Electronically Signed   By: Marijo Conception, M.D.   On: 01/26/2015 09:29     Physical Exam: BP 108/70 mmHg  Pulse 72  Ht 5' 6"  (1.676 m)  Wt 331 lb (150.141 kg)  BMI 53.45 kg/m2 General: Pleasant, well developed , can American female in no acute distress Head: Normocephalic and atraumatic Eyes:  sclerae anicteric, conjunctiva pink  Ears: Normal auditory acuity Lungs: Clear throughout to auscultation Heart: Regular rate and rhythm Abdomen: Soft, non distended, non-tender. No masses, no hepatomegaly. Normal bowel sounds Musculoskeletal: Symmetrical with no gross deformities  Extremities: No edema  Neurological: Alert oriented x 4, grossly nonfocal Psychological:  Alert and cooperative. Normal mood and affect  Assessment and Recommendations: 54 year old female with a  history of chronic abdominal pain status post admission for an enteritis, here for follow-up. She has completed a course of Cipro and Flagyl for her enteritis and has noted significant improvement. She has not had any further nausea or vomiting, she is moving her bowels normally, and she has no pain. She will follow up here in approximately 6 months, sooner if needed but will proceed with her plans for bariatric surgery later this month.        Denym Rahimi, Vita Barley PA-C 02/14/2015,

## 2015-02-14 NOTE — Patient Instructions (Signed)
Please follow up with Dr. Carlean Purl in 6 months

## 2015-02-15 ENCOUNTER — Ambulatory Visit: Payer: Self-pay | Admitting: Surgery

## 2015-02-15 NOTE — H&P (Signed)
Chief Complaint:  Morbid obesity  History of Present Illness:  Tammy [name] is an 54 y.o. female who Has been seen and evaluated in the office regarding her morbid obesity.  Her current BMI is 53.5.  Previously we have discussed both Roux-en-Y gastric bypass and sleeve gastrectomy.  She's had complaints of reflux stricture treated with a PPI with success and for that reason I had discouraged her initial desire to have a sleeve gastrectomy.  Upper GI however show some esophageal dysmotility and no hiatal hernia.  2 weeks ago she was hospitalized with abdominal pain and was found to have an infectious enteritis.  She's been seen by Dr. Carlean Purl and apparently it was not thought to represent Crohn's disease.  I reviewed her CT scan which showed some thickened loops of small bowel.  Once again and talk to her about her surgery and told her that we might need to look at a sleeve gastrectomy if the adhesions are too bad at the time of her surgery.  She is otherwise ready to go ahead and have her Roux-en-Y gastric bypass possible sleeve gastrectomy.  Past Medical History  Diagnosis Date  . GERD (gastroesophageal reflux disease)   . Hypertension   . Hyperlipidemia   . Diabetes mellitus     TYPE 2  . Pneumonia   . Sinusitis   . Acute on chronic appendicitis s/p lap appy 06/10/2012 06/10/2012  . Morbid obesity - BMI > 50 12/13/2012    Past Surgical History  Procedure Laterality Date  . Abdominal hysterectomy    . Knee surgery Right      KNEE SURGERY   . Upper gastrointestinal endoscopy  04/14/2006    Normal  . Colonoscopy  04/11/11    5 mm polyp removed but not recovered  . Laparoscopic appendectomy  06/10/2012    Procedure: APPENDECTOMY LAPAROSCOPIC;  Surgeon: Adin Hector, MD;  Location: [facility];  Service: General;  Laterality: N/A;    Current Outpatient Prescriptions  Medication Sig Dispense Refill  . amLODipine (NORVASC) 10 MG tablet Take 1 tablet (10 mg total) by mouth daily. 30 tablet 0   . Elastic Bandages & Supports (FUTURO SHEER SUPPORT HOSE) MISC 1 packet by Other route daily. 1 pair of support hose to wear daily 1 each 0  . fluticasone (FLONASE) 50 MCG/ACT nasal spray Place 2 sprays into both nostrils daily as needed for allergies.    . furosemide (LASIX) 40 MG tablet Take 1 tablet (40 mg total) by mouth every other day.    Marland Kitchen glucose blood test strip Check blood sugar twice daily 100 each 11  . loratadine (CLARITIN) 10 MG tablet Take 1 tablet (10 mg total) by mouth daily as needed for allergies.    . pantoprazole (PROTONIX) 40 MG tablet Take 2 tablets (80 mg total) by mouth daily. 180 tablet 3  . potassium chloride (K-DUR,KLOR-CON) 10 MEQ tablet Take 2 tablets (20 mEq total) by mouth daily.     No current facility-administered medications for this visit.   Ace inhibitors; Cheese; Red dye; and Tuna Family History  Problem Relation Age of Onset  . Hyperlipidemia Mother   . Hypertension Mother   . Cancer Mother 90    hodgkins lymphoma  . Diabetes Father   . Hyperlipidemia Brother   . Colon cancer Neg Hx   . Heart attack Neg Hx   . Sudden death Neg Hx   . Diabetes Paternal Grandmother    Social History:   reports that she  has never smoked. She has never used smokeless tobacco. She reports that she does not drink alcohol or use illicit drugs.   REVIEW OF SYSTEMS : Negative except for see problem list  Physical Exam:   There were no vitals taken for this visit. BMI 54. There is no weight on file to calculate BMI.  Gen:  WDWN AAF NAD  Neurological: Alert and oriented to person, place, and time. Motor and sensory function is grossly intact  Head: Normocephalic and atraumatic.  Eyes: Conjunctivae are normal. Pupils are equal, round, and reactive to light. No scleral icterus.  Neck: Normal range of motion. Neck supple. No tracheal deviation or thyromegaly present.  Cardiovascular:  SR without murmurs or gallops.  No carotid bruits Breast:  Not  examined Respiratory: Effort normal.  No respiratory distress. No chest wall tenderness. Breath sounds normal.  No wheezes, rales or rhonchi.  Abdomen:  Obese with prior longitundinal hysterectomy incisions GU:  Not examined Musculoskeletal: Normal range of motion. Extremities are nontender. No cyanosis, edema or clubbing noted Lymphadenopathy: No cervical, preauricular, postauricular or axillary adenopathy is present Skin: Skin is warm and dry. No rash noted. No diaphoresis. No erythema. No pallor. Pscyh: Normal mood and affect. Behavior is normal. Judgment and thought content normal.   LABORATORY RESULTS: No results found for this or any previous visit (from the past 48 hour(s)).   RADIOLOGY RESULTS: No results found.  Problem List: Patient Active Problem List   Diagnosis Date Noted  . Emesis, persistent 01/26/2015  . Enteritis 01/25/2015  . Ascites 01/25/2015  . Abdominal pain 01/22/2015  . Ovarian cyst, left 01/03/2015  . Allergic rhinitis 12/21/2013  . Right ankle pain 11/10/2013  . Morbid obesity - BMI > 50 12/13/2012  . Chronic vomiting 12/13/2012  . Lactose intolerance 10/22/2012  . Left knee pain 07/26/2012  . Bacterial vaginosis 06/10/2012  . Bradycardia, sinus 03/28/2012  . Central positional vertigo 03/27/2012  . Tongue swelling 08/21/2011  . Urgency of urination 05/14/2011  . Personal history of colonic polyps 04/11/2011  . Otalgia 11/14/2010  . OTHER TENOSYNOVITIS OF HAND AND WRIST 07/19/2010  . DYSPEPSIA 10/17/2009  . BACK PAIN, LUMBAR, WITH RADICULOPATHY 12/27/2008  . VITAMIN B12 DEFICIENCY 09/29/2008  . SYNCOPE 08/21/2008  . ANKLE EDEMA, CHRONIC 12/16/2006  . Type 2 diabetes mellitus ([place]) 11/09/2006  . Hyperlipidemia 11/09/2006  . Essential hypertension 05/26/2006  . GERD 05/26/2006    Assessment & Plan: Morbid obesity for roux Y gastric bypass  Possible sleeve gastrectomy      Matt B. Hassell Done, MD, [facility] [phone] beeper [phone]  02/15/2015 11:34 AM

## 2015-02-16 NOTE — Patient Instructions (Addendum)
Hi-Nella  02/16/2015   Your procedure is scheduled on:   02-27-2015 Tuesday.  Enter through [facility]  Entrance and follow signs to [facility]. Arrive at     0515   AM .  (Limit 1 person with you).  Call this number if you have problems the morning of surgery: [phone]  Or Presurgical Testing [phone].   For Living Will and/or Health Care Power Attorney Forms: please provide copy for your medical record,may bring AM of surgery(Forms should be already notarized -we do not provide this service).(02-19-15 No, information preferred today and given).  Remember: Follow any bowel prep instructions per MD office.    Do not eat food/ or drink: After Midnight.      Take these medicines the morning of surgery with A SIP OF WATER-   (DO NOT TAKE ANY DIABETIC MEDS AM OF SURGERY) : Amlodipine. Pantoprazole.   Do not wear jewelry, make-up or nail polish.  Do not wear deodorant, lotions, powders, or perfumes.   Do not shave legs and under arms- 48 hours(2 days) prior to first CHG shower.(Shaving face and neck okay.)  Do not bring valuables to the hospital.(Hospital is not responsible for lost valuables).  Contacts, dentures or removable bridgework, body piercing, hair pins may not be worn into surgery.  Leave suitcase in the car. After surgery it may be brought to your room.  For patients admitted to the hospital, checkout time is 11:00 AM the day of discharge.(Restricted visitors-Any Persons displaying flu-like symptoms or illness).    Patients discharged the day of surgery will not be allowed to drive home. Must have responsible person with you x 24 hours once discharged.  Name and phone number of your driver: Spouse -Paislea Hatton -[phone] cell      Please read over the following fact sheets that you were given:  CHG(Chlorhexidine Gluconate 4% Surgical Soap) use.          [place] - Preparing for Surgery Before surgery, you can play an important  role.  Because skin is not sterile, your skin needs to be as free of germs as possible.  You can reduce the number of germs on your skin by washing with CHG (chlorahexidine gluconate) soap before surgery.  CHG is an antiseptic cleaner which kills germs and bonds with the skin to continue killing germs even after washing. Please DO NOT use if you have an allergy to CHG or antibacterial soaps.  If your skin becomes reddened/irritated stop using the CHG and inform your nurse when you arrive at Short Stay. Do not shave (including legs and underarms) for at least 48 hours prior to the first CHG shower.  You may shave your face/neck. Please follow these instructions carefully:  1.  Shower with CHG Soap the night before surgery and the  morning of Surgery.  2.  If you choose to wash your hair, wash your hair first as usual with your  normal  shampoo.  3.  After you shampoo, rinse your hair and body thoroughly to remove the  shampoo.                           4.  Use CHG as you would any other liquid soap.  You can apply chg directly  to the skin and wash  Gently with a scrungie or clean washcloth.  5.  Apply the CHG Soap to your body ONLY FROM THE NECK DOWN.   Do not use on face/ open                           Wound or open sores. Avoid contact with eyes, ears mouth and genitals (private parts).                       Wash face,  Genitals (private parts) with your normal soap.             6.  Wash thoroughly, paying special attention to the area where your surgery  will be performed.  7.  Thoroughly rinse your body with warm water from the neck down.  8.  DO NOT shower/wash with your normal soap after using and rinsing off  the CHG Soap.                9.  Pat yourself dry with a clean towel.            10.  Wear clean pajamas.            11.  Place clean sheets on your bed the night of your first shower and do not  sleep with pets. Day of Surgery : Do not apply any lotions/deodorants  the morning of surgery.  Please wear clean clothes to the hospital/surgery center.  FAILURE TO FOLLOW THESE INSTRUCTIONS MAY RESULT IN THE CANCELLATION OF YOUR SURGERY PATIENT SIGNATURE_________________________________  NURSE SIGNATURE__________________________________  ________________________________________________________________________

## 2015-02-19 ENCOUNTER — Encounter (HOSPITAL_COMMUNITY)
Admission: RE | Admit: 2015-02-19 | Discharge: 2015-02-19 | Disposition: A | Discharge status: Home / Self Care | Payer: 59 | Source: Ambulatory Visit | Attending: Surgery | Admitting: Surgery

## 2015-02-19 ENCOUNTER — Encounter (HOSPITAL_COMMUNITY): Payer: Self-pay

## 2015-02-19 DIAGNOSIS — Z01818 Encounter for other preprocedural examination: Secondary | ICD-10-CM | POA: Insufficient documentation

## 2015-02-19 HISTORY — DX: Other specified disorders of veins: I87.8

## 2015-02-19 NOTE — Pre-Procedure Instructions (Signed)
02-19-15 labs CBC//d,CMP,CEA,CA125 -Epic to be used. EKG 02-06-15,CXR 1 view-01-26-15

## 2015-02-20 ENCOUNTER — Telehealth: Payer: Self-pay | Admitting: Family Medicine

## 2015-02-20 DIAGNOSIS — E43 Unspecified severe protein-calorie malnutrition: Secondary | ICD-10-CM

## 2015-02-20 MED ORDER — AMLODIPINE BESYLATE 10 MG PO TABS: 10.0000 mg | ORAL_TABLET | Freq: Every day | ORAL | Status: DC

## 2015-02-20 MED ORDER — FUROSEMIDE 40 MG PO TABS: 40.0000 mg | ORAL_TABLET | Freq: Every day | ORAL | Status: DC

## 2015-02-20 NOTE — Telephone Encounter (Signed)
Relation to IR:WERX Call back number:[phone] Pharmacy: [id number] - [place],  - [address] AT [place] [phone] (Phone) [phone] (Fax)         Reason for call:  Patient requesting a refill amLODipine (NORVASC) 10 MG tablet and furosemide (LASIX) 40 MG tablet

## 2015-02-20 NOTE — Telephone Encounter (Signed)
Rx faxed and the patient is aware.       KP 

## 2015-02-23 NOTE — Progress Notes (Signed)
Agree w/ Ms. Hvozdovic's note and mangement.  

## 2015-02-27 ENCOUNTER — Inpatient Hospital Stay (HOSPITAL_COMMUNITY)
Admission: RE | Admit: 2015-02-27 | Discharge: 2015-03-01 | DRG: 621 | Disposition: A | Discharge status: Home / Self Care | Payer: 59 | Source: Ambulatory Visit | Attending: Surgery | Admitting: Surgery

## 2015-02-27 ENCOUNTER — Encounter (HOSPITAL_COMMUNITY): Payer: Self-pay | Admitting: *Deleted

## 2015-02-27 ENCOUNTER — Encounter (HOSPITAL_COMMUNITY)
Admission: RE | Disposition: A | Discharge status: Home / Self Care | Payer: Self-pay | Source: Ambulatory Visit | Attending: Surgery

## 2015-02-27 ENCOUNTER — Inpatient Hospital Stay (HOSPITAL_COMMUNITY): Payer: 59 | Admitting: Anesthesiology

## 2015-02-27 DIAGNOSIS — K219 Gastro-esophageal reflux disease without esophagitis: Secondary | ICD-10-CM | POA: Diagnosis present

## 2015-02-27 DIAGNOSIS — Z79899 Other long term (current) drug therapy: Secondary | ICD-10-CM

## 2015-02-27 DIAGNOSIS — Z6841 Body Mass Index (BMI) 40.0 and over, adult: Secondary | ICD-10-CM

## 2015-02-27 DIAGNOSIS — Z807 Family history of other malignant neoplasms of lymphoid, hematopoietic and related tissues: Secondary | ICD-10-CM

## 2015-02-27 DIAGNOSIS — E785 Hyperlipidemia, unspecified: Secondary | ICD-10-CM | POA: Diagnosis present

## 2015-02-27 DIAGNOSIS — I10 Essential (primary) hypertension: Secondary | ICD-10-CM | POA: Diagnosis present

## 2015-02-27 DIAGNOSIS — Z8249 Family history of ischemic heart disease and other diseases of the circulatory system: Secondary | ICD-10-CM

## 2015-02-27 DIAGNOSIS — Z833 Family history of diabetes mellitus: Secondary | ICD-10-CM

## 2015-02-27 DIAGNOSIS — Z01812 Encounter for preprocedural laboratory examination: Secondary | ICD-10-CM | POA: Diagnosis not present

## 2015-02-27 DIAGNOSIS — K224 Dyskinesia of esophagus: Secondary | ICD-10-CM | POA: Diagnosis present

## 2015-02-27 DIAGNOSIS — E119 Type 2 diabetes mellitus without complications: Secondary | ICD-10-CM | POA: Diagnosis present

## 2015-02-27 DIAGNOSIS — Z9884 Bariatric surgery status: Secondary | ICD-10-CM

## 2015-02-27 HISTORY — PX: GASTRIC ROUX-EN-Y: SHX5262

## 2015-02-27 LAB — GLUCOSE, CAPILLARY
Glucose-Capillary: 91 mg/dL (ref 65–99)
Glucose-Capillary: 159 mg/dL — ABNORMAL HIGH (ref 65–99)

## 2015-02-27 SURGERY — LAPAROSCOPIC ROUX-EN-Y GASTRIC BYPASS WITH UPPER ENDOSCOPY
Anesthesia: General

## 2015-02-27 MED ORDER — ACETAMINOPHEN 160 MG/5ML PO SOLN
325.0000 mg | ORAL | Status: DC | PRN
Start: 2015-02-28 — End: 2015-03-01

## 2015-02-27 MED ORDER — ONDANSETRON HCL 4 MG/2ML IJ SOLN
INTRAMUSCULAR | Status: DC | PRN
  Administered 2015-02-27: 4 mg via INTRAVENOUS

## 2015-02-27 MED ORDER — ROCURONIUM BROMIDE 100 MG/10ML IV SOLN
INTRAVENOUS | Status: DC | PRN
  Administered 2015-02-27 (×2): 10 mg via INTRAVENOUS
  Administered 2015-02-27: 20 mg via INTRAVENOUS
  Administered 2015-02-27: 10 mg via INTRAVENOUS
  Administered 2015-02-27: 40 mg via INTRAVENOUS
  Administered 2015-02-27: 10 mg via INTRAVENOUS

## 2015-02-27 MED ORDER — PROPOFOL 10 MG/ML IV BOLUS
INTRAVENOUS | Status: AC
  Filled 2015-02-27: qty 20

## 2015-02-27 MED ORDER — LIDOCAINE HCL (CARDIAC) 20 MG/ML IV SOLN
INTRAVENOUS | Status: AC
  Filled 2015-02-27: qty 5

## 2015-02-27 MED ORDER — FENTANYL CITRATE (PF) 100 MCG/2ML IJ SOLN
INTRAMUSCULAR | Status: AC
  Filled 2015-02-27: qty 4

## 2015-02-27 MED ORDER — MIDAZOLAM HCL 5 MG/5ML IJ SOLN
INTRAMUSCULAR | Status: DC | PRN
  Administered 2015-02-27 (×2): 1 mg via INTRAVENOUS

## 2015-02-27 MED ORDER — DEXAMETHASONE SODIUM PHOSPHATE 4 MG/ML IJ SOLN
INTRAMUSCULAR | Status: DC | PRN
Start: 2015-02-27 — End: 2015-02-27
  Administered 2015-02-27: 10 mg via INTRAVENOUS

## 2015-02-27 MED ORDER — BUPIVACAINE LIPOSOME 1.3 % IJ SUSP
20.0000 mL | Freq: Once | INTRAMUSCULAR | Status: AC
  Administered 2015-02-27: 20 mL
  Filled 2015-02-27: qty 20

## 2015-02-27 MED ORDER — DEXTROSE 5 % IV SOLN
INTRAVENOUS | Status: AC
  Filled 2015-02-27: qty 2

## 2015-02-27 MED ORDER — GLYCOPYRROLATE 0.2 MG/ML IJ SOLN
INTRAMUSCULAR | Status: AC
  Filled 2015-02-27: qty 2

## 2015-02-27 MED ORDER — CHLORHEXIDINE GLUCONATE CLOTH 2 % EX PADS: 6.0000 | MEDICATED_PAD | Freq: Once | CUTANEOUS | Status: DC

## 2015-02-27 MED ORDER — PROMETHAZINE HCL 25 MG/ML IJ SOLN
6.2500 mg | INTRAMUSCULAR | Status: DC | PRN
  Administered 2015-02-27: 6.25 mg via INTRAVENOUS

## 2015-02-27 MED ORDER — ACETAMINOPHEN 160 MG/5ML PO SOLN: 650.0000 mg | ORAL | Status: DC | PRN

## 2015-02-27 MED ORDER — METOCLOPRAMIDE HCL 5 MG/ML IJ SOLN
INTRAMUSCULAR | Status: DC | PRN
  Administered 2015-02-27: 10 mg via INTRAVENOUS

## 2015-02-27 MED ORDER — LIDOCAINE HCL (CARDIAC) 20 MG/ML IV SOLN
INTRAVENOUS | Status: DC | PRN
  Administered 2015-02-27: 50 mg via INTRAVENOUS

## 2015-02-27 MED ORDER — MORPHINE SULFATE (PF) 2 MG/ML IV SOLN
2.0000 mg | INTRAVENOUS | Status: DC | PRN
  Administered 2015-02-27: 2 mg via INTRAVENOUS
  Administered 2015-02-28 (×2): 4 mg via INTRAVENOUS
  Filled 2015-02-27: qty 1
  Filled 2015-02-27 (×2): qty 2

## 2015-02-27 MED ORDER — HYDROMORPHONE HCL 1 MG/ML IJ SOLN: 0.2500 mg | INTRAMUSCULAR | Status: DC | PRN

## 2015-02-27 MED ORDER — FENTANYL CITRATE (PF) 100 MCG/2ML IJ SOLN
INTRAMUSCULAR | Status: DC | PRN
  Administered 2015-02-27 (×3): 50 ug via INTRAVENOUS
  Administered 2015-02-27: 25 ug via INTRAVENOUS
  Administered 2015-02-27: 100 ug via INTRAVENOUS
  Administered 2015-02-27: 25 ug via INTRAVENOUS
  Administered 2015-02-27: 50 ug via INTRAVENOUS

## 2015-02-27 MED ORDER — SUCCINYLCHOLINE CHLORIDE 20 MG/ML IJ SOLN
INTRAMUSCULAR | Status: DC | PRN
  Administered 2015-02-27: 100 mg via INTRAVENOUS

## 2015-02-27 MED ORDER — EVICEL 5 ML EX KIT
5.0000 mL | PACK | Freq: Once | CUTANEOUS | Status: DC
  Filled 2015-02-27: qty 1

## 2015-02-27 MED ORDER — DEXTROSE 5 % IV SOLN
2.0000 g | INTRAVENOUS | Status: AC
  Administered 2015-02-27 (×2): 2 g via INTRAVENOUS

## 2015-02-27 MED ORDER — NEOSTIGMINE METHYLSULFATE 10 MG/10ML IV SOLN
INTRAVENOUS | Status: DC | PRN
Start: 2015-02-27 — End: 2015-02-27
  Administered 2015-02-27: 4 mg via INTRAVENOUS

## 2015-02-27 MED ORDER — ONDANSETRON HCL 4 MG/2ML IJ SOLN
4.0000 mg | INTRAMUSCULAR | Status: DC | PRN
  Administered 2015-02-27: 4 mg via INTRAVENOUS
  Filled 2015-02-27: qty 2

## 2015-02-27 MED ORDER — PHENYLEPHRINE 40 MCG/ML (10ML) SYRINGE FOR IV PUSH (FOR BLOOD PRESSURE SUPPORT)
PREFILLED_SYRINGE | INTRAVENOUS | Status: AC
  Filled 2015-02-27: qty 10

## 2015-02-27 MED ORDER — EVICEL 5 ML EX KIT
PACK | Freq: Once | CUTANEOUS | Status: DC
  Filled 2015-02-27: qty 1

## 2015-02-27 MED ORDER — PROMETHAZINE HCL 25 MG/ML IJ SOLN
INTRAMUSCULAR | Status: AC
  Filled 2015-02-27: qty 1

## 2015-02-27 MED ORDER — PROPOFOL 10 MG/ML IV BOLUS
INTRAVENOUS | Status: DC | PRN
  Administered 2015-02-27: 170 mg via INTRAVENOUS

## 2015-02-27 MED ORDER — DEXAMETHASONE SODIUM PHOSPHATE 10 MG/ML IJ SOLN
INTRAMUSCULAR | Status: AC
  Filled 2015-02-27: qty 1

## 2015-02-27 MED ORDER — EVICEL 5 ML EX KIT
PACK | Freq: Once | CUTANEOUS | Status: AC
  Administered 2015-02-27: 1
  Filled 2015-02-27: qty 1

## 2015-02-27 MED ORDER — EPHEDRINE SULFATE 50 MG/ML IJ SOLN
INTRAMUSCULAR | Status: DC | PRN
  Administered 2015-02-27: 10 mg via INTRAVENOUS

## 2015-02-27 MED ORDER — HEPARIN SODIUM (PORCINE) 5000 UNIT/ML IJ SOLN
5000.0000 [IU] | Freq: Three times a day (TID) | INTRAMUSCULAR | Status: DC
  Administered 2015-02-27 – 2015-03-01 (×5): 5000 [IU] via SUBCUTANEOUS
  Filled 2015-02-27 (×8): qty 1

## 2015-02-27 MED ORDER — GLYCOPYRROLATE 0.2 MG/ML IJ SOLN
INTRAMUSCULAR | Status: DC | PRN
Start: 2015-02-27 — End: 2015-02-27
  Administered 2015-02-27: 0.4 mg via INTRAVENOUS

## 2015-02-27 MED ORDER — EPHEDRINE SULFATE 50 MG/ML IJ SOLN
INTRAMUSCULAR | Status: AC
  Filled 2015-02-27: qty 1

## 2015-02-27 MED ORDER — NEOSTIGMINE METHYLSULFATE 10 MG/10ML IV SOLN
INTRAVENOUS | Status: AC
  Filled 2015-02-27: qty 1

## 2015-02-27 MED ORDER — SODIUM CHLORIDE 0.9 % IJ SOLN
INTRAMUSCULAR | Status: AC
  Filled 2015-02-27: qty 10

## 2015-02-27 MED ORDER — OXYCODONE HCL 5 MG/5ML PO SOLN
5.0000 mg | ORAL | Status: DC | PRN
  Administered 2015-03-01: 10 mg via ORAL
  Administered 2015-03-01: 5 mg via ORAL
  Filled 2015-02-27: qty 5
  Filled 2015-02-27: qty 10

## 2015-02-27 MED ORDER — HEPARIN SODIUM (PORCINE) 5000 UNIT/ML IJ SOLN
5000.0000 [IU] | INTRAMUSCULAR | Status: AC
Start: 2015-02-27 — End: 2015-02-27
  Administered 2015-02-27: 5000 [IU] via SUBCUTANEOUS
  Filled 2015-02-27: qty 1

## 2015-02-27 MED ORDER — PHENYLEPHRINE HCL 10 MG/ML IJ SOLN
INTRAMUSCULAR | Status: DC | PRN
  Administered 2015-02-27: 40 ug via INTRAVENOUS
  Administered 2015-02-27: 80 ug via INTRAVENOUS
  Administered 2015-02-27: 40 ug via INTRAVENOUS

## 2015-02-27 MED ORDER — METOCLOPRAMIDE HCL 5 MG/ML IJ SOLN
INTRAMUSCULAR | Status: AC
  Filled 2015-02-27: qty 2

## 2015-02-27 MED ORDER — FENTANYL CITRATE (PF) 250 MCG/5ML IJ SOLN
INTRAMUSCULAR | Status: AC
  Filled 2015-02-27: qty 25

## 2015-02-27 MED ORDER — PREMIER PROTEIN SHAKE
2.0000 [oz_av] | Freq: Four times a day (QID) | ORAL | Status: DC
  Administered 2015-03-01 (×2): 2 [oz_av] via ORAL

## 2015-02-27 MED ORDER — MIDAZOLAM HCL 2 MG/2ML IJ SOLN
INTRAMUSCULAR | Status: AC
  Filled 2015-02-27: qty 4

## 2015-02-27 MED ORDER — ROCURONIUM BROMIDE 100 MG/10ML IV SOLN
INTRAVENOUS | Status: AC
  Filled 2015-02-27: qty 1

## 2015-02-27 MED ORDER — LACTATED RINGERS IR SOLN
Status: DC | PRN
  Administered 2015-02-27: 500 mL

## 2015-02-27 MED ORDER — KCL IN DEXTROSE-NACL 20-5-0.45 MEQ/L-%-% IV SOLN
INTRAVENOUS | Status: DC
  Administered 2015-02-27: 15:00:00 via INTRAVENOUS
  Administered 2015-02-28 (×2): 100 mL/h via INTRAVENOUS
  Filled 2015-02-27 (×6): qty 1000

## 2015-02-27 MED ORDER — LACTATED RINGERS IV SOLN
INTRAVENOUS | Status: DC | PRN
  Administered 2015-02-27 (×3): via INTRAVENOUS

## 2015-02-27 MED ORDER — ONDANSETRON HCL 4 MG/2ML IJ SOLN
INTRAMUSCULAR | Status: AC
  Filled 2015-02-27: qty 2

## 2015-02-27 MED ORDER — TISSEEL VH 10 ML EX KIT
PACK | CUTANEOUS | Status: AC
  Filled 2015-02-27: qty 1

## 2015-02-27 MED ORDER — PANTOPRAZOLE SODIUM 40 MG IV SOLR
40.0000 mg | Freq: Every day | INTRAVENOUS | Status: DC
  Administered 2015-02-27 – 2015-02-28 (×2): 40 mg via INTRAVENOUS
  Filled 2015-02-27 (×3): qty 40

## 2015-02-27 SURGICAL SUPPLY — 77 items
APL SKNCLS STERI-STRIP NONHPOA (GAUZE/BANDAGES/DRESSINGS)
APL SRG 32X5 SNPLK LF DISP (MISCELLANEOUS) ×1
APPLICATOR COTTON TIP 6IN STRL (MISCELLANEOUS) ×4 IMPLANT
APPLIER CLIP ROT 10 11.4 M/L (STAPLE)
APPLIER CLIP ROT 13.4 12 LRG (CLIP)
APR CLP LRG 13.4X12 ROT 20 MLT (CLIP)
APR CLP MED LRG 11.4X10 (STAPLE)
BENZOIN TINCTURE PRP APPL 2/3 (GAUZE/BANDAGES/DRESSINGS) IMPLANT
BLADE SURG 15 STRL LF DISP TIS (BLADE) ×1 IMPLANT
BLADE SURG 15 STRL SS (BLADE) ×2
CABLE HIGH FREQUENCY MONO STRZ (ELECTRODE) IMPLANT
CLIP APPLIE ROT 10 11.4 M/L (STAPLE) IMPLANT
CLIP APPLIE ROT 13.4 12 LRG (CLIP) IMPLANT
CLIP SUT LAPRA TY ABSORB (SUTURE) ×2 IMPLANT
COVER SURGICAL LIGHT HANDLE (MISCELLANEOUS) ×2 IMPLANT
DEVICE SUT QUICK LOAD TK 5 (STAPLE) ×1 IMPLANT
DEVICE SUT TI-KNOT TK 5X26 (MISCELLANEOUS) IMPLANT
DEVICE SUTURE ENDOST 10MM (ENDOMECHANICALS) ×2 IMPLANT
DISSECTOR BLUNT TIP ENDO 5MM (MISCELLANEOUS) IMPLANT
DRAIN PENROSE 18X1/4 LTX STRL (WOUND CARE) ×2 IMPLANT
DRAPE CAMERA CLOSED 9X96 (DRAPES) ×2 IMPLANT
EVICEL AIRLESS SPRAY ACCES (MISCELLANEOUS) ×1 IMPLANT
GAUZE SPONGE 4X4 12PLY STRL (GAUZE/BANDAGES/DRESSINGS) IMPLANT
GAUZE SPONGE 4X4 16PLY XRAY LF (GAUZE/BANDAGES/DRESSINGS) ×2 IMPLANT
GLOVE BIOGEL M 8.0 STRL (GLOVE) ×2 IMPLANT
GOWN STRL REUS W/TWL XL LVL3 (GOWN DISPOSABLE) ×8 IMPLANT
HANDLE STAPLE EGIA 4 XL (STAPLE) ×2 IMPLANT
HOVERMATT SINGLE USE (MISCELLANEOUS) ×2 IMPLANT
KIT BASIN OR (CUSTOM PROCEDURE TRAY) ×2 IMPLANT
KIT GASTRIC LAVAGE 34FR ADT (SET/KITS/TRAYS/PACK) ×2 IMPLANT
NDL SPNL 22GX3.5 QUINCKE BK (NEEDLE) ×1 IMPLANT
NEEDLE SPNL 22GX3.5 QUINCKE BK (NEEDLE) ×2 IMPLANT
PACK CARDIOVASCULAR III (CUSTOM PROCEDURE TRAY) ×2 IMPLANT
PEN SKIN MARKING BROAD (MISCELLANEOUS) ×1 IMPLANT
RELOAD EGIA 45 MED/THCK PURPLE (STAPLE) ×1 IMPLANT
RELOAD EGIA 45 TAN VASC (STAPLE) ×1 IMPLANT
RELOAD EGIA 60 MED/THCK PURPLE (STAPLE) ×4 IMPLANT
RELOAD EGIA 60 TAN VASC (STAPLE) IMPLANT
RELOAD ENDO STITCH 2.0 (ENDOMECHANICALS) ×18
RELOAD STAPLE 45 PURP MED/THCK (STAPLE) IMPLANT
RELOAD STAPLE 60 MED/THCK ART (STAPLE) IMPLANT
RELOAD SUT SNGL STCH ABSRB 2-0 (ENDOMECHANICALS) ×5 IMPLANT
RELOAD SUT SNGL STCH BLK 2-0 (ENDOMECHANICALS) ×4 IMPLANT
RELOAD TRI 45 ART MED THCK PUR (STAPLE) IMPLANT
RELOAD TRI 60 ART MED THCK PUR (STAPLE) IMPLANT
SCISSORS LAP 5X45 EPIX DISP (ENDOMECHANICALS) ×2 IMPLANT
SCRUB PCMX 4 OZ (MISCELLANEOUS) ×3 IMPLANT
SEALANT SURGICAL APPL DUAL CAN (MISCELLANEOUS) ×2 IMPLANT
SET IRRIG TUBING LAPAROSCOPIC (IRRIGATION / IRRIGATOR) ×2 IMPLANT
SHEARS CURVED HARMONIC AC 45CM (MISCELLANEOUS) ×2 IMPLANT
SLEEVE ADV FIXATION 12X100MM (TROCAR) ×4 IMPLANT
SLEEVE ADV FIXATION 5X100MM (TROCAR) IMPLANT
SOLUTION ANTI FOG 6CC (MISCELLANEOUS) ×2 IMPLANT
STAPLER VISISTAT 35W (STAPLE) ×2 IMPLANT
STRIP CLOSURE SKIN 1/2X4 (GAUZE/BANDAGES/DRESSINGS) IMPLANT
SUT RELOAD ENDO STITCH 2 48X1 (ENDOMECHANICALS) ×4
SUT RELOAD ENDO STITCH 2.0 (ENDOMECHANICALS) ×5
SUT SURGIDAC NAB ES-9 0 48 120 (SUTURE) IMPLANT
SUT VIC AB 2-0 SH 27 (SUTURE) ×2
SUT VIC AB 2-0 SH 27X BRD (SUTURE) ×1 IMPLANT
SUT VIC AB 4-0 SH 18 (SUTURE) ×2 IMPLANT
SUTURE RELOAD END STTCH 2 48X1 (ENDOMECHANICALS) ×4 IMPLANT
SUTURE RELOAD ENDO STITCH 2.0 (ENDOMECHANICALS) ×5 IMPLANT
SYR 10ML ECCENTRIC (SYRINGE) ×2 IMPLANT
SYR 20CC LL (SYRINGE) ×4 IMPLANT
SYR 50ML LL SCALE MARK (SYRINGE) ×2 IMPLANT
TOWEL OR 17X26 10 PK STRL BLUE (TOWEL DISPOSABLE) ×4 IMPLANT
TOWEL OR NON WOVEN STRL DISP B (DISPOSABLE) ×2 IMPLANT
TRAY FOLEY W/METER SILVER 14FR (SET/KITS/TRAYS/PACK) ×2 IMPLANT
TRAY FOLEY W/METER SILVER 16FR (SET/KITS/TRAYS/PACK) ×2 IMPLANT
TROCAR ADV FIXATION 12X100MM (TROCAR) ×2 IMPLANT
TROCAR ADV FIXATION 5X100MM (TROCAR) ×2 IMPLANT
TROCAR BLADELESS OPT 5 100 (ENDOMECHANICALS) ×2 IMPLANT
TROCAR XCEL 12X100 BLDLESS (ENDOMECHANICALS) ×2 IMPLANT
TUBING CONNECTING 10 (TUBING) ×2 IMPLANT
TUBING ENDO SMARTCAP PENTAX (MISCELLANEOUS) ×2 IMPLANT
TUBING FILTER THERMOFLATOR (ELECTROSURGICAL) ×2 IMPLANT

## 2015-02-27 NOTE — Interval H&P Note (Signed)
History and Physical Interval Note:  02/27/2015 7:17 AM  Tammy [name]  has presented today for surgery, with the diagnosis of Morbid Obesity  The various methods of treatment have been discussed with the patient and family. After consideration of risks, benefits and other options for treatment, the patient has consented to  Procedure(s): LAPAROSCOPIC ROUX-EN-Y GASTRIC BYPASS WITH UPPER ENDOSCOPY (N/A) as a surgical intervention .  The patient's history has been reviewed, patient examined, no change in status, stable for surgery.  I have reviewed the patient's chart and labs.  Questions were answered to the patient's satisfaction.     Barbette Mcglaun B

## 2015-02-27 NOTE — Op Note (Signed)
Surgeon: Althea Grimmer. Hassell Done, MD, FACS Asst:  Greer Pickerel, MD, FACS Anesthesia: General endotracheal Drains: None  Procedure: Laparoscopic Roux en Y gastric bypass with 40 cm BP limb and 100 cm Roux limb, antecolic, antegastric, candy cane to the left.  Closure of Peterson's defect. Upper endoscopy.   Description of Procedure:  The patient was taken to OR 1 at [facility] and given general anesthesia.  The abdomen was prepped with PCMX and draped sterilely.  A time out was performed.    The operation began by identifying the ligament of Treitz. I measured 40 cm downstream and divided the bowel with a 6 cm Covidian stapler. There was no evidence of prior small bowel inflammation.    I sutured a Penrose drain along the Roux limb end.  I measured a 1 meter (100 cm) Roux limb and then placed the distal bowels to the BP limb side by side and performed a stapled jejunojejunostomy. The common defect was closed from either end with 4-0 Vicryl using the Endo Stitch. The mesenteric defect was closed with a running 2-0 silk using the Endo Stitch. Tisseel was applied to the suture line.  The omentum was divided with the harmonic scalpel.  The Nathanson retractor was inserted in the left lateral segment of liver was retracted. The foregut dissection ensued.  A 4--5 cm pouch was measured and divided along the lessor curvature.  Two loads of the Coviden purple 6 cm stapler were followed by purple loads with TRS reinforcement to complete the pouch.   The Roux limb was then brought up with the candycane pointed left and a back row of sutures of 2-0 Vicryl were placed. I opened along the right side of each structure and inserted the 4.5 cm stapler to create the gastrojejunostomy. The common defect was closed from either end with 2-0 Vicryl and a second row was placed anterior to that the Ewald tube acting as a stent across the anastomosis. The Penrose drain was removed. Peterson's defect was closed with 2-0 silk.   Endoscopy was  performed by Dr. Fuller Plan bubbles or bleeding were noted.  .    The incisions were injected with Exparel and were closed with 4-0 Vicryl and Liquiban  The patient was taken to the recovery room in satisfactory condition.  Matt B. Hassell Done, MD, FACS

## 2015-02-27 NOTE — Anesthesia Procedure Notes (Signed)
Procedure Name: Intubation Date/Time: 02/27/2015 7:35 AM Performed by: Deliah Boston Pre-anesthesia Checklist: Patient identified, Emergency Drugs available, Suction available and Patient being monitored Patient Re-evaluated:Patient Re-evaluated prior to inductionOxygen Delivery Method: Circle System Utilized Preoxygenation: Pre-oxygenation with 100% oxygen Intubation Type: IV induction Ventilation: Mask ventilation without difficulty Laryngoscope Size: Mac and 3 Grade View: Grade II Tube type: Oral Number of attempts: 1 Airway Equipment and Method: Stylet and Oral airway Placement Confirmation: ETT inserted through vocal cords under direct vision,  positive ETCO2 and breath sounds checked- equal and bilateral Secured at: 22 cm Tube secured with: Tape Dental Injury: Teeth and Oropharynx as per pre-operative assessment

## 2015-02-27 NOTE — Transfer of Care (Signed)
Immediate Anesthesia Transfer of Care Note  Patient: Tammy [name]  Procedure(s) Performed: Procedure(s): LAPAROSCOPIC ROUX-EN-Y GASTRIC BYPASS WITH UPPER ENDOSCOPY (N/A)  Patient Location: PACU  Anesthesia Type:General  Level of Consciousness: sedated  Airway & Oxygen Therapy: Patient Spontanous Breathing and Patient connected to face mask oxygen  Post-op Assessment: Report given to RN and Post -op Vital signs reviewed and stable  Post vital signs: Reviewed and stable  Last Vitals:  Filed Vitals:   02/27/15 0534  BP: 139/82  Pulse: 97  Temp: 36.6 C  Resp: 18    Complications: No apparent anesthesia complications

## 2015-02-27 NOTE — Anesthesia Preprocedure Evaluation (Signed)
Anesthesia Evaluation  Patient identified by MRN, date of birth, ID band Patient awake    Reviewed: Allergy & Precautions, H&P , NPO status , Patient's Chart, lab work & pertinent test results  History of Anesthesia Complications (+) DIFFICULT IV STICK / SPECIAL LINE and history of anesthetic complications  Airway Mallampati: II  TM Distance: >3 FB Neck ROM: full    Dental no notable dental hx.    Pulmonary neg pulmonary ROS,    Pulmonary exam normal breath sounds clear to auscultation       Cardiovascular Exercise Tolerance: Good hypertension, Pt. on medications Normal cardiovascular exam Rhythm:regular Rate:Normal     Neuro/Psych negative neurological ROS  negative psych ROS   GI/Hepatic negative GI ROS, Neg liver ROS, GERD  Medicated and Controlled,  Endo/Other  negative endocrine ROSdiabetes, Well Controlled, Type 2, Insulin DependentMorbid obesity  Renal/GU negative Renal ROS  negative genitourinary   Musculoskeletal   Abdominal (+) + obese,   Peds  Hematology negative hematology ROS (+)   Anesthesia Other Findings Grade 1 airway DL in 2014  Reproductive/Obstetrics negative OB ROS                             Anesthesia Physical  Anesthesia Plan  ASA: III and emergent  Anesthesia Plan: General   Post-op Pain Management:    Induction: Intravenous  Airway Management Planned: Oral ETT  Additional Equipment:   Intra-op Plan:   Post-operative Plan: Extubation in OR  Informed Consent: I have reviewed the patients History and Physical, chart, labs and discussed the procedure including the risks, benefits and alternatives for the proposed anesthesia with the patient or authorized representative who has indicated his/her understanding and acceptance.   Dental Advisory Given  Plan Discussed with: CRNA, Surgeon and Anesthesiologist  Anesthesia Plan Comments:          Anesthesia Quick Evaluation

## 2015-02-27 NOTE — Anesthesia Postprocedure Evaluation (Signed)
  Anesthesia Post-op Note  Patient: Tammy [name]  Procedure(s) Performed: Procedure(s) (LRB): LAPAROSCOPIC ROUX-EN-Y GASTRIC BYPASS WITH UPPER ENDOSCOPY (N/A)  Patient Location: PACU  Anesthesia Type: General  Level of Consciousness: awake and alert   Airway and Oxygen Therapy: Patient Spontanous Breathing  Post-op Pain: mild  Post-op Assessment: Post-op Vital signs reviewed, Patient's Cardiovascular Status Stable, Respiratory Function Stable, Patent Airway and No signs of Nausea or vomiting  Last Vitals:  Filed Vitals:   02/27/15 0534  BP: 139/82  Pulse: 97  Temp: 36.6 C  Resp: 18    Post-op Vital Signs: stable   Complications: No apparent anesthesia complications

## 2015-02-27 NOTE — Progress Notes (Signed)
PACU note---order noted for pt to have H & H drawn in PACU; lab personnel x 2 attempted blood draw and Dagmar Hait, RN attempted x 2; veins assessed with vein finder and unable to locate any veins to draw blood; Dr. Hassell Done notified and discontinued order for H & H to be drawn in PACU postop

## 2015-02-27 NOTE — Op Note (Signed)
Tammy [name] [id number] February 01, 1961 02/27/2015  Preoperative diagnosis: Morbid obesity  Postoperative diagnosis: Same   Procedure: Upper endoscopy   Surgeon: Gayland Curry M.D., FACS   Anesthesia: Gen.   Indications for procedure: 54 yo female undergoing a laparoscopic roux en y gastric bypass and an upper endoscopy was requested to evaluate the anastomosis.  Description of procedure: After we have completed the new gastrojejunostomy, I scrubbed out and obtained the Pentax endoscope. I gently placed endoscope in the patient's oropharynx and gently glided it down the esophagus without any difficulty under direct visualization. Once I was in the gastric pouch, I insufflated the pouch was air. The pouch was approximately 4-5cm in size. I was able to cannulate and advanced the scope through the gastrojejunostomy. Dr.Martin had placed saline in the upper abdomen. Upon further insufflation of the gastric pouch there was no evidence of bubbles. Upon further inspection of the gastric pouch, the mucosa appeared normal. There is no evidence of any mucosal abnormality. The gastric pouch and Roux limb were decompressed. The width of the gastrojejunal anastomosis was at least 2.5 cm. The scope was withdrawn. The patient tolerated this portion of the procedure well. Please see Dr Earlie Server operative note for details regarding the laparoscopic roux-en-y gastric bypass.  Tammy Ruff. Redmond Pulling, MD, FACS General, Bariatric, & Minimally Invasive Surgery [facility], [place]

## 2015-02-27 NOTE — H&P (View-Only) (Signed)
Chief Complaint:  Morbid obesity  History of Present Illness:  Tammy [name] is an 53 y.o. female who Has been seen and evaluated in the office regarding her morbid obesity.  Her current BMI is 53.5.  Previously we have discussed both Roux-en-Y gastric bypass and sleeve gastrectomy.  She's had complaints of reflux stricture treated with a PPI with success and for that reason I had discouraged her initial desire to have a sleeve gastrectomy.  Upper GI however show some esophageal dysmotility and no hiatal hernia.  2 weeks ago she was hospitalized with abdominal pain and was found to have an infectious enteritis.  She's been seen by Dr. Carlean Purl and apparently it was not thought to represent Crohn's disease.  I reviewed her CT scan which showed some thickened loops of small bowel.  Once again and talk to her about her surgery and told her that we might need to look at a sleeve gastrectomy if the adhesions are too bad at the time of her surgery.  She is otherwise ready to go ahead and have her Roux-en-Y gastric bypass possible sleeve gastrectomy.  Past Medical History  Diagnosis Date  . GERD (gastroesophageal reflux disease)   . Hypertension   . Hyperlipidemia   . Diabetes mellitus     TYPE 2  . Pneumonia   . Sinusitis   . Acute on chronic appendicitis s/p lap appy 06/10/2012 06/10/2012  . Morbid obesity - BMI > 50 12/13/2012    Past Surgical History  Procedure Laterality Date  . Abdominal hysterectomy    . Knee surgery Right      KNEE SURGERY   . Upper gastrointestinal endoscopy  04/14/2006    Normal  . Colonoscopy  04/11/11    5 mm polyp removed but not recovered  . Laparoscopic appendectomy  06/10/2012    Procedure: APPENDECTOMY LAPAROSCOPIC;  Surgeon: Adin Hector, MD;  Location: [facility] ORS;  Service: General;  Laterality: N/A;    Current Outpatient Prescriptions  Medication Sig Dispense Refill  . amLODipine (NORVASC) 10 MG tablet Take 1 tablet (10 mg total) by mouth daily. 30 tablet 0   . Elastic Bandages & Supports (FUTURO SHEER SUPPORT HOSE) MISC 1 packet by Other route daily. 1 pair of support hose to wear daily 1 each 0  . fluticasone (FLONASE) 50 MCG/ACT nasal spray Place 2 sprays into both nostrils daily as needed for allergies.    . furosemide (LASIX) 40 MG tablet Take 1 tablet (40 mg total) by mouth every other day.    Marland Kitchen glucose blood test strip Check blood sugar twice daily 100 each 11  . loratadine (CLARITIN) 10 MG tablet Take 1 tablet (10 mg total) by mouth daily as needed for allergies.    . pantoprazole (PROTONIX) 40 MG tablet Take 2 tablets (80 mg total) by mouth daily. 180 tablet 3  . potassium chloride (K-DUR,KLOR-CON) 10 MEQ tablet Take 2 tablets (20 mEq total) by mouth daily.     No current facility-administered medications for this visit.   Ace inhibitors; Cheese; Red dye; and Tuna Family History  Problem Relation Age of Onset  . Hyperlipidemia Mother   . Hypertension Mother   . Cancer Mother 43    hodgkins lymphoma  . Diabetes Father   . Hyperlipidemia Brother   . Colon cancer Neg Hx   . Heart attack Neg Hx   . Sudden death Neg Hx   . Diabetes Paternal Grandmother    Social History:   reports that she  has never smoked. She has never used smokeless tobacco. She reports that she does not drink alcohol or use illicit drugs.   REVIEW OF SYSTEMS : Negative except for see problem list  Physical Exam:   There were no vitals taken for this visit. BMI 54. There is no weight on file to calculate BMI.  Gen:  WDWN AAF NAD  Neurological: Alert and oriented to person, place, and time. Motor and sensory function is grossly intact  Head: Normocephalic and atraumatic.  Eyes: Conjunctivae are normal. Pupils are equal, round, and reactive to light. No scleral icterus.  Neck: Normal range of motion. Neck supple. No tracheal deviation or thyromegaly present.  Cardiovascular:  SR without murmurs or gallops.  No carotid bruits Breast:  Not  examined Respiratory: Effort normal.  No respiratory distress. No chest wall tenderness. Breath sounds normal.  No wheezes, rales or rhonchi.  Abdomen:  Obese with prior longitundinal hysterectomy incisions GU:  Not examined Musculoskeletal: Normal range of motion. Extremities are nontender. No cyanosis, edema or clubbing noted Lymphadenopathy: No cervical, preauricular, postauricular or axillary adenopathy is present Skin: Skin is warm and dry. No rash noted. No diaphoresis. No erythema. No pallor. Pscyh: Normal mood and affect. Behavior is normal. Judgment and thought content normal.   LABORATORY RESULTS: No results found for this or any previous visit (from the past 48 hour(s)).   RADIOLOGY RESULTS: No results found.  Problem List: Patient Active Problem List   Diagnosis Date Noted  . Emesis, persistent 01/26/2015  . Enteritis 01/25/2015  . Ascites 01/25/2015  . Abdominal pain 01/22/2015  . Ovarian cyst, left 01/03/2015  . Allergic rhinitis 12/21/2013  . Right ankle pain 11/10/2013  . Morbid obesity - BMI > 50 12/13/2012  . Chronic vomiting 12/13/2012  . Lactose intolerance 10/22/2012  . Left knee pain 07/26/2012  . Bacterial vaginosis 06/10/2012  . Bradycardia, sinus 03/28/2012  . Central positional vertigo 03/27/2012  . Tongue swelling 08/21/2011  . Urgency of urination 05/14/2011  . Personal history of colonic polyps 04/11/2011  . Otalgia 11/14/2010  . OTHER TENOSYNOVITIS OF HAND AND WRIST 07/19/2010  . DYSPEPSIA 10/17/2009  . BACK PAIN, LUMBAR, WITH RADICULOPATHY 12/27/2008  . VITAMIN B12 DEFICIENCY 09/29/2008  . SYNCOPE 08/21/2008  . ANKLE EDEMA, CHRONIC 12/16/2006  . Type 2 diabetes mellitus ([place]) 11/09/2006  . Hyperlipidemia 11/09/2006  . Essential hypertension 05/26/2006  . GERD 05/26/2006    Assessment & Plan: Morbid obesity for roux Y gastric bypass  Possible sleeve gastrectomy      Matt B. Hassell Done, MD, [facility] [phone] beeper [phone]  02/15/2015 11:34 AM

## 2015-02-27 NOTE — Progress Notes (Signed)
PACU note---foley cath removed per order by H Flinchum-Land, NT 3

## 2015-02-28 ENCOUNTER — Inpatient Hospital Stay (HOSPITAL_COMMUNITY): Payer: 59

## 2015-02-28 LAB — HEMOGLOBIN AND HEMATOCRIT, BLOOD
HEMATOCRIT: 35.2 % — AB (ref 36.0–46.0)
HEMOGLOBIN: 11.7 g/dL — AB (ref 12.0–15.0)

## 2015-02-28 LAB — CBC WITH DIFFERENTIAL/PLATELET
BASOS PCT: 0 %
Basophils Absolute: 0 10*3/uL (ref 0.0–0.1)
EOS ABS: 0 10*3/uL (ref 0.0–0.7)
EOS PCT: 0 %
HCT: 36.3 % (ref 36.0–46.0)
HEMOGLOBIN: 12 g/dL (ref 12.0–15.0)
Lymphocytes Relative: 7 %
Lymphs Abs: 0.7 10*3/uL (ref 0.7–4.0)
MCH: 28.5 pg (ref 26.0–34.0)
MCHC: 33.1 g/dL (ref 30.0–36.0)
MCV: 86.2 fL (ref 78.0–100.0)
MONOS PCT: 5 %
Monocytes Absolute: 0.5 10*3/uL (ref 0.1–1.0)
NEUTROS PCT: 88 %
Neutro Abs: 8.5 10*3/uL — ABNORMAL HIGH (ref 1.7–7.7)
PLATELETS: 237 10*3/uL (ref 150–400)
RBC: 4.21 MIL/uL (ref 3.87–5.11)
RDW: 13.7 % (ref 11.5–15.5)
WBC: 9.6 10*3/uL (ref 4.0–10.5)

## 2015-02-28 MED ORDER — IOHEXOL 300 MG/ML  SOLN
50.0000 mL | Freq: Once | INTRAMUSCULAR | Status: DC | PRN
  Administered 2015-02-28: 50 mL via ORAL
  Filled 2015-02-28: qty 50

## 2015-02-28 NOTE — Plan of Care (Signed)
Problem: Food- and Nutrition-Related Knowledge Deficit (NB-1.1) Goal: Nutrition education Formal process to instruct or train a patient/client in a skill or to impart knowledge to help patients/clients voluntarily manage or modify food choices and eating behavior to maintain or improve health. Outcome: Completed/Met Date Met:  02/28/15 Nutrition Education Note  Received consult for diet education per DROP protocol.   Discussed 2 week post op diet with pt. Emphasized that liquids must be non carbonated, non caffeinated, and sugar free. Fluid goals discussed. Pt to follow up with outpatient bariatric RD for further diet progression after 2 weeks. Multivitamins and minerals also reviewed. Teach back method used, pt expressed understanding, expect good compliance.   Diet: First 2 Weeks  You will see the nutritionist about two (2) weeks after your surgery. The nutritionist will increase the types of foods you can eat if you are handling liquids well:  If you have severe vomiting or nausea and cannot handle clear liquids lasting longer than 1 day, call your surgeon  Protein Shake  Drink at least 2 ounces of shake 5-6 times per day  Each serving of protein shakes (usually 8 - 12 ounces) should have a minimum of:  15 grams of protein  And no more than 5 grams of carbohydrate  Goal for protein each day:  Men = 80 grams per day  Women = 60 grams per day  Protein powder may be added to fluids such as non-fat milk or Lactaid milk or Soy milk (limit to 35 grams added protein powder per serving)   Hydration  Slowly increase the amount of water and other clear liquids as tolerated (See Acceptable Fluids)  Slowly increase the amount of protein shake as tolerated  Sip fluids slowly and throughout the day  May use sugar substitutes in small amounts (no more than 6 - 8 packets per day; i.e. Splenda)   Fluid Goal  The first goal is to drink at least 8 ounces of protein shake/drink per day (or as directed  by the nutritionist); some examples of protein shakes are [employer], [employer], EAS Edge HP, and Unjury. See handout from pre-op Bariatric Education Class:  Slowly increase the amount of protein shake you drink as tolerated  You may find it easier to slowly sip shakes throughout the day  It is important to get your proteins in first  Your fluid goal is to drink 64 - 100 ounces of fluid daily  It may take a few weeks to build up to this  32 oz (or more) should be clear liquids  And  32 oz (or more) should be full liquids (see below for examples)  Liquids should not contain sugar, caffeine, or carbonation   Clear Liquids:  Water or Sugar-free flavored water (i.e. Fruit H2O, Propel)  Decaffeinated coffee or tea (sugar-free)  Crystal Lite, Wyler's Lite, Minute Maid Lite  Sugar-free Jell-O  Bouillon or broth  Sugar-free Popsicle: *Less than 20 calories each; Limit 1 per day   Full Liquids:  Protein Shakes/Drinks + 2 choices per day of other full liquids  Full liquids must be:  No More Than 12 grams of Carbs per serving  No More Than 3 grams of Fat per serving  Strained low-fat cream soup  Non-Fat milk  Fat-free Lactaid Milk  Sugar-free yogurt (Dannon Lite & Fit, Greek yogurt)     Clayton Bibles, MS, RD, LDN Pager: [phone] After Hours Pager: [phone]

## 2015-02-28 NOTE — Care Management Note (Signed)
Case Management Note  Patient Details  Name: Tammy [name] MRN: [id number] Date of Birth: 03/22/1961  Subjective/Objective:          S/p Laparoscopic Roux en Y gastric bypass           Action/Plan: Discharge planning, no new needs  Expected Discharge Date:                  Expected Discharge Plan:  Home/Self Care  In-House Referral:  NA  Discharge planning Services  CM Consult  Post Acute Care Choice:  NA Choice offered to:  NA  DME Arranged:  N/A DME Agency:  NA  HH Arranged:  NA HH Agency:  NA  Status of Service:  Completed, signed off  Medicare Important Message Given:    Date Medicare IM Given:    Medicare IM give by:    Date Additional Medicare IM Given:    Additional Medicare Important Message give by:     If discussed at [place] of Stay Meetings, dates discussed:    Additional Comments:  Guadalupe Maple, RN 02/28/2015, 12:43 PM

## 2015-02-28 NOTE — Progress Notes (Signed)
Patient alert and oriented, Post op day 1.  Provided support and encouragement.  Encouraged pulmonary toilet, ambulation and small sips of liquids.  All questions answered.  Will continue to monitor. 

## 2015-02-28 NOTE — Progress Notes (Signed)
Patient ID: Tammy [name], female   [name]: [birth year], 54 y.o.   MRN: [id number] [facility] Progress Note:   1 Day Post-Op  Subjective: Mental status is clear.  Explained procedure to her.   Objective: Vital signs in last 24 hours: Temp:  [97.5 F (36.4 C)-99.1 F (37.3 C)] 99.1 F (37.3 C) (10/26 1024) Pulse Rate:  [61-98] 74 (10/26 1024) Resp:  [15-20] 18 (10/26 1024) BP: (120-156)/(58-84) 120/62 mmHg (10/26 1024) SpO2:  [95 %-100 %] 95 % (10/26 1024)  Intake/Output from previous day: 10/25 0701 - 10/26 0700 In: 3883.3 [I.V.:3883.3] Out: 1270 [Urine:1270] Intake/Output this shift:    Physical Exam: Work of breathing is normal.  Incisions ok  Lab Results:  Results for orders placed or performed during the hospital encounter of 02/27/15 (from the past 48 hour(s))  Glucose, capillary     Status: None   Collection Time: 02/27/15  5:30 AM  Result Value Ref Range   Glucose-Capillary 91 65 - 99 mg/dL   Comment 1 Notify RN   Glucose, capillary     Status: Abnormal   Collection Time: 02/27/15 11:29 AM  Result Value Ref Range   Glucose-Capillary 159 (H) 65 - 99 mg/dL   Comment 1 Notify RN    Comment 2 Document in Chart   CBC WITH DIFFERENTIAL     Status: Abnormal   Collection Time: 02/28/15  5:18 AM  Result Value Ref Range   WBC 9.6 4.0 - 10.5 K/uL   RBC 4.21 3.87 - 5.11 MIL/uL   Hemoglobin 12.0 12.0 - 15.0 g/dL   HCT 36.3 36.0 - 46.0 %   MCV 86.2 78.0 - 100.0 fL   MCH 28.5 26.0 - 34.0 pg   MCHC 33.1 30.0 - 36.0 g/dL   RDW 13.7 11.5 - 15.5 %   Platelets 237 150 - 400 K/uL   Neutrophils Relative % 88 %   Neutro Abs 8.5 (H) 1.7 - 7.7 K/uL   Lymphocytes Relative 7 %   Lymphs Abs 0.7 0.7 - 4.0 K/uL   Monocytes Relative 5 %   Monocytes Absolute 0.5 0.1 - 1.0 K/uL   Eosinophils Relative 0 %   Eosinophils Absolute 0.0 0.0 - 0.7 K/uL   Basophils Relative 0 %   Basophils Absolute 0.0 0.0 - 0.1 K/uL    Radiology/Results: Dg Ugi W/water Sol Cm  02/28/2015   CLINICAL DATA:  Status post Roux-en-Y gastric bypass surgery. EXAM: WATER SOLUBLE UPPER GI SERIES TECHNIQUE: Single-column upper GI series was performed using water soluble contrast. CONTRAST:  25mL OMNIPAQUE IOHEXOL 300 MG/ML  SOLN COMPARISON:  CT of the abdomen 01/27/2015 FLUOROSCOPY TIME:  Fluoroscopy Time (in minutes and seconds): 55 seconds Number of Acquired Images:  18 FINDINGS: The visualized portion of the lower esophagus was smooth in contour and demonstrated normal motility. There was no narrowing or stricture. There was no hiatal hernia. The gastric pouch has a size of approximately 2 vertebral bodies height. Delayed passage of contrast is seen from the gastric pouch to the jejunal Roux limb. The gastrojejunal anastomosis appears intact. There is no evidence of extraluminal contrast extravasation. There is a slow advancement of contrast. The jejuno-jejunal anastomosis is not visualized after 15 minutes of the initial contrast administration. IMPRESSION: Status post Roux-en-Y gastric bypass, with normal appearance of the gastrojejunal anastomosis, and no evidence of extraluminal contrast extravasation. Extremely slow advancement of contrast through the jejunal Roux limb, with non visualization of the jejuno-jejunal anastomosis. These results were called by telephone at  the time of interpretation on 02/28/2015 at 10:39 am to Dr. Johnathan Hausen , who verbally acknowledged these results. Electronically Signed   By: Fidela Salisbury M.D.   On: 02/28/2015 10:39    Anti-infectives: Anti-infectives    Start     Dose/Rate Route Frequency Ordered Stop   02/27/15 0531  cefOXitin (MEFOXIN) 2 g in dextrose 5 % 50 mL IVPB     2 g 100 mL/hr over 30 Minutes Intravenous On call to O.R. 02/27/15 0531 02/27/15 0937      Assessment/Plan: Problem List: Patient Active Problem List   Diagnosis Date Noted  . 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 02/27/2015  . Emesis, persistent 01/26/2015  . Enteritis  01/25/2015  . Ascites 01/25/2015  . Abdominal pain 01/22/2015  . Ovarian cyst, left 01/03/2015  . Allergic rhinitis 12/21/2013  . Right ankle pain 11/10/2013  . Morbid obesity - BMI > 50 12/13/2012  . Chronic vomiting 12/13/2012  . Lactose intolerance 10/22/2012  . Left knee pain 07/26/2012  . Bacterial vaginosis 06/10/2012  . Bradycardia, sinus 03/28/2012  . Central positional vertigo 03/27/2012  . Tongue swelling 08/21/2011  . Urgency of urination 05/14/2011  . Personal history of colonic polyps 04/11/2011  . Otalgia 11/14/2010  . OTHER TENOSYNOVITIS OF HAND AND WRIST 07/19/2010  . DYSPEPSIA 10/17/2009  . BACK PAIN, LUMBAR, WITH RADICULOPATHY 12/27/2008  . VITAMIN B12 DEFICIENCY 09/29/2008  . SYNCOPE 08/21/2008  . ANKLE EDEMA, CHRONIC 12/16/2006  . Type 2 diabetes mellitus ([place]) 11/09/2006  . Hyperlipidemia 11/09/2006  . Essential hypertension 05/26/2006  . GERD 05/26/2006    UGI looked ok.  Will begin pd 1 diet 1 Day Post-Op    LOS: 1 day   Matt B. Hassell Done, MD, [facility] [phone] beeper [phone]  02/28/2015 11:27 AM

## 2015-03-01 LAB — CBC WITH DIFFERENTIAL/PLATELET
Basophils Absolute: 0 10*3/uL (ref 0.0–0.1)
Basophils Relative: 0 %
EOS ABS: 0 10*3/uL (ref 0.0–0.7)
EOS PCT: 0 %
HCT: 32.8 % — ABNORMAL LOW (ref 36.0–46.0)
HEMOGLOBIN: 11 g/dL — AB (ref 12.0–15.0)
LYMPHS ABS: 1.3 10*3/uL (ref 0.7–4.0)
Lymphocytes Relative: 24 %
MCH: 28.8 pg (ref 26.0–34.0)
MCHC: 33.5 g/dL (ref 30.0–36.0)
MCV: 85.9 fL (ref 78.0–100.0)
MONOS PCT: 7 %
Monocytes Absolute: 0.4 10*3/uL (ref 0.1–1.0)
NEUTROS PCT: 69 %
Neutro Abs: 3.7 10*3/uL (ref 1.7–7.7)
Platelets: 199 10*3/uL (ref 150–400)
RBC: 3.82 MIL/uL — ABNORMAL LOW (ref 3.87–5.11)
RDW: 14 % (ref 11.5–15.5)
WBC: 5.5 10*3/uL (ref 4.0–10.5)

## 2015-03-01 MED ORDER — ONDANSETRON 4 MG PO TBDP
4.0000 mg | ORAL_TABLET | Freq: Once | ORAL | Status: AC
  Administered 2015-03-01: 4 mg via ORAL
  Filled 2015-03-01: qty 1

## 2015-03-01 NOTE — Discharge Summary (Signed)
Physician Discharge Summary  Patient ID: Tammy [name] MRN: [id number] [name]/AGE: Sep 02, 1960 54 y.o.  Admit date: 02/27/2015 Discharge date: 03/01/2015  Admission Diagnoses:  Morbid obesity and DM  Discharge Diagnoses:  same  Principal Problem:   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 Active Problems:   Morbid obesity - BMI > 50   Surgery:  Laparoscopic roux en Y gastric bypass  Discharged Condition: improved   Hospital Course:   Had gastric bypass.  UGI on PD 1 was ok.  Diet advanced and ready for discharge on PD 2  Consults: none  Significant Diagnostic Studies: UGI    Discharge Exam: Blood pressure 135/61, pulse 78, temperature 98.1 F (36.7 C), temperature source Oral, resp. rate 21, height 5\' 6"  (1.676 m), weight 150.197 kg (331 lb 2 oz), SpO2 98 %. Incisions OK.   Disposition: 01-Home or Self Care  Discharge Instructions    Ambulate hourly while awake    Complete by:  As directed      Call MD for:  difficulty breathing, headache or visual disturbances    Complete by:  As directed      Call MD for:  persistant dizziness or light-headedness    Complete by:  As directed      Call MD for:  persistant nausea and vomiting    Complete by:  As directed      Call MD for:  redness, tenderness, or signs of infection (pain, swelling, redness, odor or green/yellow discharge around incision site)    Complete by:  As directed      Call MD for:  severe uncontrolled pain    Complete by:  As directed      Call MD for:  temperature >101 F    Complete by:  As directed      Diet bariatric full liquid    Complete by:  As directed      Incentive spirometry    Complete by:  As directed   Perform hourly while awake            Medication List    TAKE these medications        amLODipine 10 MG tablet  Commonly known as:  NORVASC  Take 1 tablet (10 mg total) by mouth daily.  Notes to Patient:  Monitor Blood Pressure Daily and keep a log for primary care physician.  You may  need to make changes to your medications with rapid weight loss.        fluticasone 50 MCG/ACT nasal spray  Commonly known as:  FLONASE  Place 2 sprays into both nostrils daily as needed for allergies.     furosemide 40 MG tablet  Commonly known as:  LASIX  Take 1 tablet (40 mg total) by mouth daily.  Notes to Patient:  Monitor Blood Pressure Daily and keep a log for primary care physician.  Monitor for symptoms of dehydration.  You may need to make changes to your medications with rapid weight loss.        FUTURO SHEER SUPPORT HOSE Misc  1 packet by Other route daily. 1 pair of support hose to wear daily     glucose blood test strip  Check blood sugar twice daily     loratadine 10 MG tablet  Commonly known as:  CLARITIN  Take 1 tablet (10 mg total) by mouth daily as needed for allergies.     pantoprazole 40 MG tablet  Commonly known as:  PROTONIX  Take 2 tablets (  80 mg total) by mouth daily.     potassium chloride 10 MEQ tablet  Commonly known as:  K-DUR,KLOR-CON  Take 2 tablets (20 mEq total) by mouth daily.           Follow-up Information    Follow up with Pedro Earls, MD. Go on 03/15/2015.   Specialty:  General Surgery   Why:  For Post-Op Check at 10:45   Contact information:   [address] [place] [address] [phone]       Signed: Pedro Earls 03/01/2015, 9:17 AM

## 2015-03-01 NOTE — Discharge Instructions (Signed)
° ° ° °GASTRIC BYPASS/SLEEVE ° Home Care Instructions ° ° These instructions are to help you care for yourself when you go home. ° °Call: If you have any problems. °• Call [phone] and ask for the surgeon on call °• If you need immediate assistance come to the ER at [facility]. Tell the ER staff you are a new post-op gastric bypass or gastric sleeve patient  °Signs and symptoms to report: • Severe  vomiting or nausea °o If you cannot handle clear liquids for longer than 1 day, call your surgeon °• Abdominal pain which does not get better after taking your pain medication °• Fever greater than 100.4°  F and chills °• Heart rate over 100 beats a minute °• Trouble breathing °• Chest pain °• Redness,  swelling, drainage, or foul odor at incision (surgical) sites °• If your incisions open or pull apart °• Swelling or pain in calf (lower leg) °• Diarrhea (Loose bowel movements that happen often), frequent watery, uncontrolled bowel movements °• Constipation, (no bowel movements for 3 days) if this happens: °o Take Milk of Magnesia, 2 tablespoons by mouth, 3 times a day for 2 days if needed °o Stop taking Milk of Magnesia once you have had a bowel movement °o Call your doctor if constipation continues °Or °o Take Miralax  (instead of Milk of Magnesia) following the label instructions °o Stop taking Miralax once you have had a bowel movement °o Call your doctor if constipation continues °• Anything you think is “abnormal for you” °  °Normal side effects after surgery: • Unable to sleep at night or unable to concentrate °• Irritability °• Being tearful (crying) or depressed ° °These are common complaints, possibly related to your anesthesia, stress of surgery, and change in lifestyle, that usually go away a few weeks after surgery. If these feelings continue, call your medical doctor.  °Wound Care: You may have surgical glue, steri-strips, or staples over your incisions after surgery °• Surgical glue: Looks like clear  film over your incisions and will wear off a little at a time °• Steri-strips: Adhesive strips of tape over your incisions. You may notice a yellowish color on skin under the steri-strips. This is used to make the steri-strips stick better. Do not pull the steri-strips off - let them fall off °• Staples: Staples may be removed before you leave the hospital °o If you go home with staples, call [facility] for an appointment with your surgeon’s nurse to have staples removed 10 days after surgery, [phone] °• Showering: You may shower two (2) days after your surgery unless your surgeon tells you differently °o Wash gently around incisions with warm soapy water, rinse well, and gently pat dry °o If you have a drain (tube from your incision), you may need someone to hold this while you shower °o No tub baths until staples are removed and incisions are healed °  °Medications: • Medications should be liquid or crushed if larger than the size of a dime °• Extended release pills (medication that releases a little bit at a time through the  day) should not be crushed °• Depending on the size and number of medications you take, you may need to space (take a few throughout the day)/change the time you take your medications so that you do not over-fill your pouch (smaller stomach) °• Make sure you follow-up with you primary care physician to make medication changes needed during rapid weight loss and life -style changes °•   If you have diabetes, follow up with your doctor that orders your diabetes medication(s) within one week after surgery and check your blood sugar regularly ° °• Do not drive while taking narcotics (pain medications) ° °• Do not take acetaminophen (Tylenol) and Roxicet or Lortab Elixir at the same time since these pain medications contain acetaminophen °  °Diet:  °First 2 Weeks You will see the nutritionist about two (2) weeks after your surgery. The nutritionist will increase the types of  foods you can eat if you are handling liquids well: °• If you have severe vomiting or nausea and cannot handle clear liquids lasting longer than 1 day call your surgeon °Protein Shake °• Drink at least 2 ounces of shake 5-6 times per day °• Each serving of protein shakes (usually 8-12 ounces) should have a minimum of: °o 15 grams of protein °o And no more than 5 grams of carbohydrate °• Goal for protein each day: °o Men = 80 grams per day °o Women = 60 grams per day °  ° • Protein powder may be added to fluids such as non-fat milk or Lactaid milk or Soy milk (limit to 35 grams added protein powder per serving) ° °Hydration °• Slowly increase the amount of water and other clear liquids as tolerated (See Acceptable Fluids) °• Slowly increase the amount of protein shake as tolerated °• Sip fluids slowly and throughout the day °• May use sugar substitutes in small amounts (no more than 6-8 packets per day; i.e. Splenda) ° °Fluid Goal °• The first goal is to drink at least 8 ounces of protein shake/drink per day (or as directed by the nutritionist); some examples of protein shakes are Syntrax Nectar, Adkins Advantage, EAS Edge HP, and Unjury. - See handout from pre-op Bariatric Education Class: °o Slowly increase the amount of protein shake you drink as tolerated °o You may find it easier to slowly sip shakes throughout the day °o It is important to get your proteins in first °• Your fluid goal is to drink 64-100 ounces of fluid daily °o It may take a few weeks to build up to this  °• 32 oz. (or more) should be clear liquids °And °• 32 oz. (or more) should be full liquids (see below for examples) °• Liquids should not contain sugar, caffeine, or carbonation ° °Clear Liquids: °• Water of Sugar-free flavored water (i.e. Fruit H²O, Propel) °• Decaffeinated coffee or tea (sugar-free) °• Crystal lite, Wyler’s Lite, Minute Maid Lite °• Sugar-free Jell-O °• Bouillon or broth °• Sugar-free Popsicle:    - Less than 20 calories  each; Limit 1 per day ° °Full Liquids: °                  Protein Shakes/Drinks + 2 choices per day of other full liquids °• Full liquids must be: °o No More Than 12 grams of Carbs per serving °o No More Than 3 grams of Fat per serving °• Strained low-fat cream soup °• Non-Fat milk °• Fat-free Lactaid Milk °• Sugar-free yogurt (Dannon Lite & Fit, Greek yogurt) ° °  °Vitamins and Minerals • Start 1 day after surgery unless otherwise directed by your surgeon °• 2 Chewable Multivitamin / Multimineral Supplement with iron (i.e. Centrum for Adults) °• Vitamin B-12, 350-500 micrograms sub-lingual (place tablet under the tongue) each day °• Chewable Calcium Citrate with Vitamin D-3 °(Example: 3 Chewable Calcium  Plus 600 with Vitamin D-3) °o Take 500 mg three (3) times a day for   a total of 1500 mg each day °o Do not take all 3 doses of calcium at one time as it may cause constipation, and you can only absorb 500 mg at a time °o Do not mix multivitamins containing iron with calcium supplements;  take 2 hours apart °o Do not substitute Tums (calcium carbonate) for your calcium °• Menstruating women and those at risk for anemia ( a blood disease that causes weakness) may need extra iron °o Talk to your doctor to see if you need more iron °• If you need extra iron: Total daily Iron recommendation (including Vitamins) is 50 to 100 mg Iron/day °• Do not stop taking or change any vitamins or minerals until you talk to your nutritionist or surgeon °• Your nutritionist and/or surgeon must approve all vitamin and mineral supplements °  °Activity and Exercise: It is important to continue walking at home. Limit your physical activity as instructed by your doctor. During this time, use these guidelines: °• Do not lift anything greater than ten  (10) pounds for at least two (2) weeks °• Do not go back to work or drive until your surgeon says you can °• You may have sex when you feel comfortable °o It is VERY important for female  patients to use a reliable birth control method; fertility often increase after surgery °o Do not get pregnant for at least 18 months °• Start exercising as soon as your doctor tells you that you can °o Make sure your doctor approves any physical activity °• Start with a simple walking program °• Walk 5-15 minutes each day, 7 days per week °• Slowly increase until you are walking 30-45 minutes per day °• Consider joining our BELT program. [phone] or email [email or web address] °  °Special Instructions Things to remember: °• Free counseling is available for you and your family through collaboration between [place] and [employer]. Please call [phone] and leave a message °• Use your CPAP when sleeping if this applies to you °• Consider buying a medical alert bracelet that says you had lap-band surgery °  °  You will likely have your first fill (fluid added to your band) 6 - 8 weeks after surgery °• [facility] has a free Bariatric Surgery Support Group that meets monthly, the 3rd Thursday, 6pm. [facility] Classrooms. You can see classes online at [email or web address] °• It is very important to keep all follow up appointments with your surgeon, nutritionist, primary care physician, and behavioral health practitioner °o After the first year, please follow up with your bariatric surgeon and nutritionist at least once a year in order to maintain best weight loss results °      °             [facility]:  [phone] ° °             [facility]: [phone] ° °             Bariatric Nurse Coordinator: [phone]  °Gastric Bypass/Sleeve Home Care Instructions  Rev. 06/2012    ° °                                                    Reviewed and Endorsed °                                                     by [facility] Patient Education Committee, Jan, 2014             GASTRIC BYPASS/SLEEVE  Home Care Instructions    These instructions are to help you care for yourself when you go home.  Call: If you have any problems.  Call [phone] and ask for the surgeon on call  If you need immediate assistance come to the ER at [facility]. Tell the ER staff you are a new post-op gastric bypass or gastric sleeve patient  Signs and symptoms to report:  Severe  vomiting or nausea o If you cannot handle clear liquids for longer than 1 day, call your surgeon  Abdominal pain which does not get better after taking your pain medication  Fever greater than 100.4  F and chills  Heart rate over 100 beats a minute  Trouble breathing  Chest pain  Redness,  swelling, drainage, or foul odor at incision (surgical) sites  If your incisions open or pull apart  Swelling or pain in calf (lower leg)  Diarrhea (Loose bowel movements that happen often), frequent watery, uncontrolled bowel movements  Constipation, (no bowel movements for 3 days) if this happens: o Take Milk of Magnesia, 2 tablespoons by mouth, 3 times a day for 2 days if needed o Stop taking Milk of Magnesia once you have had a bowel movement o Call your doctor if constipation continues Or o Take Miralax  (instead of Milk of Magnesia) following the label instructions o Stop taking Miralax once you have had a bowel movement o Call your doctor if constipation continues  Anything you think is abnormal for you   Normal side effects after surgery:  Unable to sleep at night or unable to concentrate  Irritability  Being tearful (crying) or depressed  These are common complaints, possibly related to your anesthesia, stress of surgery, and change in lifestyle, that usually go away a few weeks after surgery. If these feelings continue, call your medical doctor.  Wound Care: You may have surgical glue, steri-strips, or staples over your incisions after surgery  Surgical glue: Looks like clear film over your incisions and will wear off a little at a  time  Steri-strips: Adhesive strips of tape over your incisions. You may notice a yellowish color on skin under the steri-strips. This is used to make the steri-strips stick better. Do not pull the steri-strips off - let them fall off  Staples: Staples may be removed before you leave the hospital o If you go home with staples, call [facility] for an appointment with your surgeons nurse to have staples removed 10 days after surgery, [phone]  Showering: You may shower two (2) days after your surgery unless your surgeon tells you differently o Wash gently around incisions with warm soapy water, rinse well, and gently pat dry o If you have a drain (tube from your incision), you may need someone to hold this while you shower o No tub baths until staples are removed and incisions are healed   Medications:  Medications should be liquid or crushed if larger than the size of a dime  Extended release pills (medication that releases a little bit at a time through the  day) should not be crushed  Depending on the size and number of medications you take, you may need to space (take a few throughout the day)/change the time you take your medications so that you do not over-fill your pouch (smaller stomach)  Make sure you follow-up  with you primary care physician to make medication changes needed during rapid weight loss and life -style changes  If you have diabetes, follow up with your doctor that orders your diabetes medication(s) within one week after surgery and check your blood sugar regularly   Do not drive while taking narcotics (pain medications)   Do not take acetaminophen (Tylenol) and Roxicet or Lortab Elixir at the same time since these pain medications contain acetaminophen   Diet:  First 2 Weeks You will see the nutritionist about two (2) weeks after your surgery. The nutritionist will increase the types of foods you can eat if you are handling liquids well:  If  you have severe vomiting or nausea and cannot handle clear liquids lasting longer than 1 day call your surgeon Protein Shake  Drink at least 2 ounces of shake 5-6 times per day  Each serving of protein shakes (usually 8-12 ounces) should have a minimum of: o 15 grams of protein o And no more than 5 grams of carbohydrate  Goal for protein each day: o Men = 80 grams per day o Women = 60 grams per day     Protein powder may be added to fluids such as non-fat milk or Lactaid milk or Soy milk (limit to 35 grams added protein powder per serving)  Hydration  Slowly increase the amount of water and other clear liquids as tolerated (See Acceptable Fluids)  Slowly increase the amount of protein shake as tolerated  Sip fluids slowly and throughout the day  May use sugar substitutes in small amounts (no more than 6-8 packets per day; i.e. Splenda)  Fluid Goal  The first goal is to drink at least 8 ounces of protein shake/drink per day (or as directed by the nutritionist); some examples of protein shakes are [employer], [employer], EAS Edge HP, and Unjury. - See handout from pre-op Bariatric Education Class: o Slowly increase the amount of protein shake you drink as tolerated o You may find it easier to slowly sip shakes throughout the day o It is important to get your proteins in first  Your fluid goal is to drink 64-100 ounces of fluid daily o It may take a few weeks to build up to this   32 oz. (or more) should be clear liquids And  32 oz. (or more) should be full liquids (see below for examples)  Liquids should not contain sugar, caffeine, or carbonation  Clear Liquids:  Water of Sugar-free flavored water (i.e. Fruit HO, Propel)  Decaffeinated coffee or tea (sugar-free)  Crystal lite, Wylers Lite, Minute Maid Lite  Sugar-free Jell-O  Bouillon or broth  Sugar-free Popsicle:    - Less than 20 calories each; Limit 1 per day  Full Liquids:                    Protein Shakes/Drinks + 2 choices per day of other full liquids  Full liquids must be: o No More Than 12 grams of Carbs per serving o No More Than 3 grams of Fat per serving  Strained low-fat cream soup  Non-Fat milk  Fat-free Lactaid Milk  Sugar-free yogurt (Dannon Lite & Fit, Greek yogurt)    Vitamins and Minerals  Start 1 day after surgery unless otherwise directed by your surgeon  2 Chewable Multivitamin / Multimineral Supplement with iron (i.e. Centrum for Adults)  Vitamin B-12, 350-500 micrograms sub-lingual (place tablet under the tongue) each day  Chewable Calcium Citrate with Vitamin D-3 (Example:  3 Chewable Calcium  Plus 600 with Vitamin D-3) o Take 500 mg three (3) times a day for a total of 1500 mg each day o Do not take all 3 doses of calcium at one time as it may cause constipation, and you can only absorb 500 mg at a time o Do not mix multivitamins containing iron with calcium supplements;  take 2 hours apart o Do not substitute Tums (calcium carbonate) for your calcium  Menstruating women and those at risk for anemia ( a blood disease that causes weakness) may need extra iron o Talk to your doctor to see if you need more iron  If you need extra iron: Total daily Iron recommendation (including Vitamins) is 50 to 100 mg Iron/day  Do not stop taking or change any vitamins or minerals until you talk to your nutritionist or surgeon  Your nutritionist and/or surgeon must approve all vitamin and mineral supplements   Activity and Exercise: It is important to continue walking at home. Limit your physical activity as instructed by your doctor. During this time, use these guidelines:  Do not lift anything greater than ten  (10) pounds for at least two (2) weeks  Do not go back to work or drive until [employer] says you can  You may have sex when you feel comfortable o It is VERY important for female patients to use a reliable birth control method; fertility often  increase after surgery o Do not get pregnant for at least 18 months  Start exercising as soon as your doctor tells you that you can o Make sure your doctor approves any physical activity  Start with a simple walking program  Walk 5-15 minutes each day, 7 days per week  Slowly increase until you are walking 30-45 minutes per day  Consider joining our [place] program. [phone] or email [email or web address]   Special Instructions Things to remember:  Free counseling is available for you and your family through collaboration between [facility] and [facility]. Please call [phone] and leave a message  Use your CPAP when sleeping if this applies to you  Consider buying a medical alert bracelet that says you had lap-band surgery     You will likely have your first fill (fluid added to your band) 6 - 8 weeks after surgery  [facility] has a free Bariatric Surgery Support Group that meets monthly, the 3rd Thursday, [place]. You can see classes online at [email or web address]  It is very important to keep all follow up appointments with your surgeon, nutritionist, primary care physician, and behavioral health practitioner o After the first year, please follow up with your bariatric surgeon and nutritionist at least once a year in order to maintain best weight loss results                    [facility]:  [place]: [phone]               Bariatric Nurse Coordinator: [phone]  Gastric Bypass/Sleeve Home Care Instructions  Rev. 06/2012  Reviewed and Endorsed                                                    by [facility] Patient Education Committee, Jan, 2014

## 2015-03-01 NOTE — Progress Notes (Signed)
Patient alert and oriented, pain controlled. Patient given discharge instructions and all questions answered. Patient verbalized understanding of instructions.

## 2015-03-01 NOTE — Progress Notes (Signed)
Patient alert and oriented, pain is controlled. Patient is tolerating fluids, advance to protein shake today, patient tolerated well. Reviewed Gastric Bypass discharge instructions with patient and patient is able to articulate understanding. Provided information on BELT program, Support Group and [facility] outpatient pharmacy. All questions answered, will continue to monitor.

## 2015-03-05 ENCOUNTER — Telehealth (HOSPITAL_COMMUNITY): Payer: Self-pay

## 2015-03-05 NOTE — Telephone Encounter (Signed)
Made discharge phone call to patient per DROP protocol. Asking the following questions.    1. Do you have someone to care for you now that you are home?  Yes 2. Are you having pain now that is not relieved by your pain medication?  No  3. Are you able to drink the recommended daily amount of fluids (48 ounces minimum/day) and protein (60-80 grams/day) as prescribed by the dietitian or nutritional counselor?  Yes to both  4. Are you taking the vitamins and minerals as prescribed?  Yes  5. Do you have the on call number to contact your surgeon if you have a problem or question?  Yes  6. Are your incisions free of redness, swelling or drainage? (If steri strips, address that these can fall off, shower as tolerated) No 7. Have your bowels moved since your surgery?  If not, are you passing gas?  yes 8. Are you up and walking 3-4 times per day?  yes 9.  Do you have an appointment made to see your surgeon in the next month?  yes

## 2015-03-06 ENCOUNTER — Telehealth: Payer: Self-pay | Admitting: Family Medicine

## 2015-03-06 MED ORDER — POTASSIUM CHLORIDE 20 MEQ PO PACK: 10.0000 meq | PACK | Freq: Every day | ORAL | Status: DC

## 2015-03-06 MED ORDER — POTASSIUM CHLORIDE 20 MEQ PO PACK: 20.0000 meq | PACK | Freq: Every day | ORAL | Status: DC

## 2015-03-06 NOTE — Telephone Encounter (Signed)
Please advise      KP 

## 2015-03-06 NOTE — Telephone Encounter (Signed)
It only comes in 20 meq------- she can use 1/2 packet daily

## 2015-03-06 NOTE — Telephone Encounter (Signed)
Pt dropped off paperwork, [employer] form, pt would like to pick up form when complete, form was placed in your tray at front office.

## 2015-03-06 NOTE — Telephone Encounter (Signed)
°  Relation to DB:ZMCE Call back number:[phone] Pharmacy:[facility] [place]  Reason for call: pt would like to know if the rx , comes in a liquid.  potassium chloride (K-DUR,KLOR-CON) 10 MEQ tablet      States her surgent said if the pills are bigger than a dime he does not suggest that she swallow it, wants to know if the rx comes in liquid form.

## 2015-03-06 NOTE — Telephone Encounter (Signed)
It comes in a powder she can mix in water

## 2015-03-06 NOTE — Telephone Encounter (Signed)
I can not find it in 10 mg powder, please advise     KP

## 2015-03-13 ENCOUNTER — Encounter: Payer: 59 | Attending: Surgery

## 2015-03-13 DIAGNOSIS — Z6841 Body Mass Index (BMI) 40.0 and over, adult: Secondary | ICD-10-CM | POA: Diagnosis not present

## 2015-03-13 DIAGNOSIS — Z713 Dietary counseling and surveillance: Secondary | ICD-10-CM | POA: Insufficient documentation

## 2015-03-13 NOTE — Telephone Encounter (Signed)
Forms with Dr. Etter Sjogren. JG//CMA

## 2015-03-13 NOTE — Progress Notes (Signed)
Bariatric Class:  Appt start time: 1530 end time:  1630.  2 Week Post-Operative Nutrition Class  Patient was seen on 03/13/2015 for Post[facility]ative [facility]   Surgery date: 02/27/2015 Surgery type: RYGB Start weight at [facility]: 336 lbs on 02/11/2014 Weight today: 312.5 lbs  Weight change: 19.9 lbs  TANITA  BODY COMP RESULTS  02/05/15 03/13/15   BMI (kg/m^2) N/A 50.4   Fat Mass (lbs)  154.5   Fat Free Mass (lbs)  158.0   Total Body Water (lbs)  115.5    The following the learning objectives were met by the patient during this course:  Identifies Phase 3A (Soft, High Proteins) Dietary Goals and will begin from 2 weeks post-operatively to 2 months post-operatively  Identifies appropriate sources of fluids and proteins   States protein recommendations and appropriate sources post-operatively  Identifies the need for appropriate texture modifications, mastication, and bite sizes when consuming solids  Identifies appropriate multivitamin and calcium sources post-operatively  Describes the need for physical activity post-operatively and will follow MD recommendations  States when to call healthcare provider regarding medication questions or post-operative complications  Handouts given during class include:  Phase 3A: Soft, High Protein Diet Handout  Follow-Up Plan: Patient will follow-up at [facility] in 6 weeks for 2 month post-op nutrition visit for diet advancement per MD.

## 2015-03-14 NOTE — Telephone Encounter (Signed)
Patient calling checking on the status of paperwork., patient states deadline was 03/10/15. Informed patient of message below and stated turn around 5 to 7 day turn around.best # [phone]

## 2015-03-15 NOTE — Telephone Encounter (Signed)
Attempted to reach pt via phone and received a busy signal, will try call at a later time. JG//CMA

## 2015-03-15 NOTE — Telephone Encounter (Signed)
Attempted to reach pt via phone and received a busy signal for the 2nd time. Will attempt to call pt at a later time. JG//CMA

## 2015-03-16 ENCOUNTER — Ambulatory Visit (INDEPENDENT_AMBULATORY_CARE_PROVIDER_SITE_OTHER): Payer: 59 | Admitting: Family Medicine

## 2015-03-16 ENCOUNTER — Encounter: Payer: Self-pay | Admitting: Family Medicine

## 2015-03-16 VITALS — BP 115/80 | HR 79 | Temp 97.8°F | Ht 66.0 in | Wt 315.2 lb

## 2015-03-16 DIAGNOSIS — E1151 Type 2 diabetes mellitus with diabetic peripheral angiopathy without gangrene: Secondary | ICD-10-CM

## 2015-03-16 DIAGNOSIS — I10 Essential (primary) hypertension: Secondary | ICD-10-CM

## 2015-03-16 DIAGNOSIS — E785 Hyperlipidemia, unspecified: Secondary | ICD-10-CM | POA: Diagnosis not present

## 2015-03-16 DIAGNOSIS — Z9884 Bariatric surgery status: Secondary | ICD-10-CM

## 2015-03-16 DIAGNOSIS — R971 Elevated cancer antigen 125 [CA 125]: Secondary | ICD-10-CM

## 2015-03-16 MED ORDER — AMLODIPINE BESYLATE 5 MG PO TABS: 5.0000 mg | ORAL_TABLET | Freq: Every day | ORAL | Status: DC

## 2015-03-16 NOTE — Assessment & Plan Note (Signed)
Only taking norvasc 5 mg As weight decreases we will dec dose again

## 2015-03-16 NOTE — Progress Notes (Signed)
Patient ID: Tammy [name], female    [name]: 07/17/1960  Age: 54 y.o. MRN: [id number]    Subjective:  Subjective HPI NUSAIBAH HEIDEL presents for f/u from gastric bypass.  She has been doing well with diet.  She is also here to f/u dm, htn and cholesterol.  Labs were done while she was in the hospital.  bp has been running low so she dec norvasc to 5 mg.  Her bs have been great off meds.       Review of Systems  Constitutional: Negative for diaphoresis, appetite change, fatigue and unexpected weight change.  Eyes: Negative for pain, redness and visual disturbance.  Respiratory: Negative for cough, chest tightness, shortness of breath and wheezing.   Cardiovascular: Negative for chest pain, palpitations and leg swelling.  Endocrine: Negative for cold intolerance, heat intolerance, polydipsia, polyphagia and polyuria.  Genitourinary: Negative for dysuria, frequency and difficulty urinating.  Neurological: Negative for dizziness, light-headedness, numbness and headaches.    History Past Medical History  Diagnosis Date  . GERD (gastroesophageal reflux disease)   . Hypertension   . Hyperlipidemia   . Sinusitis   . Acute on chronic appendicitis s/p lap appy 06/10/2012 06/10/2012    surgery done 06-10-12  . Morbid obesity - BMI > 50 12/13/2012  . Pneumonia     none recent  . Diabetes mellitus     TYPE 2- no meds in several months-was taken off meds -monitoring blood sugar.  Marland Kitchen Poor venous access     "usually difficult stick"    She has past surgical history that includes Abdominal hysterectomy; Knee surgery (Right); Upper gastrointestinal endoscopy (04/14/2006); Colonoscopy (04/11/11); laparoscopic appendectomy (06/10/2012); Appendectomy; and Gastric Roux-En-Y (N/A, 02/27/2015).   Her family history includes Cancer (age of onset: 43) in her mother; Diabetes in her father and paternal grandmother; Hyperlipidemia in her brother and mother; Hypertension in her mother. There is no history of Colon  cancer, Heart attack, or Sudden death.She reports that she has never smoked. She has never used smokeless tobacco. She reports that she does not drink alcohol or use illicit drugs.  Current Outpatient Prescriptions on File Prior to Visit  Medication Sig Dispense Refill  . Elastic Bandages & Supports (FUTURO SHEER SUPPORT HOSE) MISC 1 packet by Other route daily. 1 pair of support hose to wear daily 1 each 0  . fluticasone (FLONASE) 50 MCG/ACT nasal spray Place 2 sprays into both nostrils daily as needed for allergies.    . furosemide (LASIX) 40 MG tablet Take 1 tablet (40 mg total) by mouth daily. (Patient taking differently: Take 40 mg by mouth every other day. ) 90 tablet 1  . glucose blood test strip Check blood sugar twice daily 100 each 11  . loratadine (CLARITIN) 10 MG tablet Take 1 tablet (10 mg total) by mouth daily as needed for allergies.    . pantoprazole (PROTONIX) 40 MG tablet Take 2 tablets (80 mg total) by mouth daily. 180 tablet 3  . potassium chloride (KLOR-CON) 20 MEQ packet Take 20 mEq by mouth daily. 30 packet 5   No current facility-administered medications on file prior to visit.     Objective:  Objective Physical Exam  Constitutional: She is oriented to person, place, and time. She appears well-developed and well-nourished.  HENT:  Head: Normocephalic and atraumatic.  Eyes: Conjunctivae and EOM are normal.  Neck: Normal range of motion. Neck supple. No JVD present. Carotid bruit is not present. No thyromegaly present.  Cardiovascular: Normal rate, regular  rhythm and normal heart sounds.   No murmur heard. Pulmonary/Chest: Effort normal and breath sounds normal. No respiratory distress. She has no wheezes. She has no rales. She exhibits no tenderness.  Musculoskeletal: She exhibits no edema.  Neurological: She is alert and oriented to person, place, and time.  Psychiatric: She has a normal mood and affect.   BP 115/80 mmHg  Pulse 79  Temp(Src) 97.8 F (36.6 C)  (Oral)  Ht 5\' 6"  (1.676 m)  Wt 315 lb 3.2 oz (142.974 kg)  BMI 50.90 kg/m2  SpO2 100% Wt Readings from Last 3 Encounters:  03/16/15 315 lb 3.2 oz (142.974 kg)  03/13/15 312 lb 12.8 oz (141.885 kg)  02/27/15 331 lb 2 oz (150.197 kg)     Lab Results  Component Value Date   WBC 5.5 03/01/2015   HGB 11.0* 03/01/2015   HCT 32.8* 03/01/2015   PLT 199 03/01/2015   GLUCOSE 114* 02/06/2015   CHOL 207* 05/02/2014   TRIG 57.0 05/02/2014   HDL 69.80 05/02/2014   LDLDIRECT 129.1 10/17/2009   LDLCALC 126* 05/02/2014   ALT 26 02/06/2015   AST 19 02/06/2015   NA 141 02/06/2015   K 3.8 02/06/2015   CL 106 02/06/2015   CREATININE 1.02 02/06/2015   BUN 14 02/06/2015   CO2 28 02/06/2015   TSH 3.181 02/21/2014   INR 1.1* 10/16/2014   HGBA1C 6.2* 01/29/2015   MICROALBUR 0.58 10/22/2012    No results found.   Assessment & Plan:  Plan I have discontinued Ms. Lanahan's amLODipine. I am also having her start on amLODipine. Additionally, I am having her maintain her pantoprazole, loratadine, fluticasone, FUTURO SHEER SUPPORT HOSE, glucose blood, furosemide, and potassium chloride.  Meds ordered this encounter  Medications  . amLODipine (NORVASC) 5 MG tablet    Sig: Take 1 tablet (5 mg total) by mouth daily.    Dispense:  90 tablet    Refill:  3    Problem List Items Addressed This Visit    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    Per Dr Hassell Done Pt doing well with diet so far      Hyperlipidemia - Primary    Off meds Recheck in 3 months      Relevant Medications   amLODipine (NORVASC) 5 MG tablet   Essential hypertension    Only taking norvasc 5 mg As weight decreases we will dec dose again      Relevant Medications   amLODipine (NORVASC) 5 MG tablet   Elevated CA-125    Repeat today       Other Visit Diagnoses    DM (diabetes mellitus) type II controlled peripheral vascular disorder ([place])        Relevant Medications    amLODipine (NORVASC) 5 MG tablet        Follow-up: Return in about 6 months (around 09/13/2015), or if symptoms worsen or fail to improve.  Garnet Koyanagi, DO

## 2015-03-16 NOTE — Progress Notes (Signed)
Pre visit review using our clinic review tool, if applicable. No additional management support is needed unless otherwise documented below in the visit note. 

## 2015-03-16 NOTE — Assessment & Plan Note (Signed)
Off meds Recheck in 3 months

## 2015-03-16 NOTE — Assessment & Plan Note (Signed)
Repeat today

## 2015-03-16 NOTE — Patient Instructions (Signed)
Diabetes and Exercise Exercising regularly is important. It is not just about losing weight. It has many health benefits, such as:  Improving your overall fitness, flexibility, and endurance.  Increasing your bone density.  Helping with weight control.  Decreasing your body fat.  Increasing your muscle strength.  Reducing stress and tension.  Improving your overall health. People with diabetes who exercise gain additional benefits because exercise:  Reduces appetite.  Improves the body's use of blood sugar (glucose).  Helps lower or control blood glucose.  Decreases blood pressure.  Helps control blood lipids (such as cholesterol and triglycerides).  Improves the body's use of the hormone insulin by:  Increasing the body's insulin sensitivity.  Reducing the body's insulin needs.  Decreases the risk for heart disease because exercising:  Lowers cholesterol and triglycerides levels.  Increases the levels of good cholesterol (such as high-density lipoproteins [HDL]) in the body.  Lowers blood glucose levels. YOUR ACTIVITY PLAN  Choose an activity that you enjoy, and set realistic goals. To exercise safely, you should begin practicing any new physical activity slowly, and gradually increase the intensity of the exercise over time. Your health care provider or diabetes educator can help create an activity plan that works for you. General recommendations include:  Encouraging children to engage in at least 60 minutes of physical activity each day.  Stretching and performing strength training exercises, such as yoga or weight lifting, at least 2 times per week.  Performing a total of at least 150 minutes of moderate-intensity exercise each week, such as brisk walking or water aerobics.  Exercising at least 3 days per week, making sure you allow no more than 2 consecutive days to pass without exercising.  Avoiding long periods of inactivity (90 minutes or more). When you  have to spend an extended period of time sitting down, take frequent breaks to walk or stretch. RECOMMENDATIONS FOR EXERCISING WITH TYPE 1 OR TYPE 2 DIABETES   Check your blood glucose before exercising. If blood glucose levels are greater than 240 mg/dL, check for urine ketones. Do not exercise if ketones are present.  Avoid injecting insulin into areas of the body that are going to be exercised. For example, avoid injecting insulin into:  The arms when playing tennis.  The legs when jogging.  Keep a record of:  Food intake before and after you exercise.  Expected peak times of insulin action.  Blood glucose levels before and after you exercise.  The type and amount of exercise you have done.  Review your records with your health care provider. Your health care provider will help you to develop guidelines for adjusting food intake and insulin amounts before and after exercising.  If you take insulin or oral hypoglycemic agents, watch for signs and symptoms of hypoglycemia. They include:  Dizziness.  Shaking.  Sweating.  Chills.  Confusion.  Drink plenty of water while you exercise to prevent dehydration or heat stroke. Body water is lost during exercise and must be replaced.  Talk to your health care provider before starting an exercise program to make sure it is safe for you. Remember, almost any type of activity is better than none.   This information is not intended to replace advice given to you by your health care provider. Make sure you discuss any questions you have with your health care provider.   Document Released: 07/12/2003 Document Revised: 09/05/2014 Document Reviewed: 09/28/2012 Elsevier Interactive Patient Education 2016 [employer]  

## 2015-03-16 NOTE — Assessment & Plan Note (Signed)
Off meds Glucose readings have been < 100 Recheck in 3 months

## 2015-03-16 NOTE — Assessment & Plan Note (Signed)
Per Dr Hassell Done Pt doing well with diet so far

## 2015-03-21 ENCOUNTER — Other Ambulatory Visit (INDEPENDENT_AMBULATORY_CARE_PROVIDER_SITE_OTHER): Payer: 59

## 2015-03-21 DIAGNOSIS — E1151 Type 2 diabetes mellitus with diabetic peripheral angiopathy without gangrene: Secondary | ICD-10-CM

## 2015-03-21 DIAGNOSIS — R971 Elevated cancer antigen 125 [CA 125]: Secondary | ICD-10-CM | POA: Diagnosis not present

## 2015-03-21 DIAGNOSIS — I10 Essential (primary) hypertension: Secondary | ICD-10-CM

## 2015-03-21 DIAGNOSIS — E785 Hyperlipidemia, unspecified: Secondary | ICD-10-CM

## 2015-03-22 LAB — COMPREHENSIVE METABOLIC PANEL
ALBUMIN: 4.2 g/dL (ref 3.5–5.2)
ALK PHOS: 61 U/L (ref 39–117)
ALT: 26 U/L (ref 0–35)
AST: 27 U/L (ref 0–37)
BILIRUBIN TOTAL: 0.6 mg/dL (ref 0.2–1.2)
BUN: 14 mg/dL (ref 6–23)
CALCIUM: 9.9 mg/dL (ref 8.4–10.5)
CO2: 28 mEq/L (ref 19–32)
Chloride: 101 mEq/L (ref 96–112)
Creatinine, Ser: 0.98 mg/dL (ref 0.40–1.20)
GFR: 76.02 mL/min (ref >60.00)
Glucose, Bld: 83 mg/dL (ref 70–99)
Potassium: 3.8 mEq/L (ref 3.5–5.1)
Sodium: 142 mEq/L (ref 135–145)
TOTAL PROTEIN: 8.1 g/dL (ref 6.0–8.3)

## 2015-03-22 LAB — LIPID PANEL
CHOLESTEROL: 164 mg/dL (ref 0–200)
HDL: 48.4 mg/dL (ref >39.00)
LDL CALC: 103 mg/dL — AB (ref 0–99)
NonHDL: 115.34
Total CHOL/HDL Ratio: 3
Triglycerides: 60 mg/dL (ref 0.0–149.0)
VLDL: 12 mg/dL (ref 0.0–40.0)

## 2015-03-22 LAB — CA 125: CA 125: 16 U/mL (ref <35)

## 2015-03-22 LAB — HEMOGLOBIN A1C: Hgb A1c MFr Bld: 6.2 % (ref 4.6–6.5)

## 2015-04-11 ENCOUNTER — Other Ambulatory Visit: Payer: 59

## 2015-04-11 ENCOUNTER — Ambulatory Visit: Payer: 59 | Admitting: Gynecology

## 2015-04-16 ENCOUNTER — Other Ambulatory Visit: Payer: Self-pay

## 2015-04-16 DIAGNOSIS — Z1231 Encounter for screening mammogram for malignant neoplasm of breast: Secondary | ICD-10-CM

## 2015-04-17 ENCOUNTER — Ambulatory Visit
Admission: RE | Admit: 2015-04-17 | Discharge: 2015-04-17 | Disposition: A | Discharge status: Home / Self Care | Payer: 59 | Source: Ambulatory Visit

## 2015-04-17 ENCOUNTER — Encounter: Payer: Self-pay | Admitting: Dietician

## 2015-04-17 ENCOUNTER — Encounter: Payer: 59 | Attending: Surgery | Admitting: Dietician

## 2015-04-17 DIAGNOSIS — Z713 Dietary counseling and surveillance: Secondary | ICD-10-CM | POA: Insufficient documentation

## 2015-04-17 DIAGNOSIS — Z1231 Encounter for screening mammogram for malignant neoplasm of breast: Secondary | ICD-10-CM

## 2015-04-17 DIAGNOSIS — Z6841 Body Mass Index (BMI) 40.0 and over, adult: Secondary | ICD-10-CM | POA: Diagnosis not present

## 2015-04-17 NOTE — Progress Notes (Signed)
  Follow-up visit:  8 Weeks Post-Operative RYGB Surgery  Medical Nutrition Therapy:  Appt start time: [id number] end time:  1115  Primary concerns today: Post-operative Bariatric Surgery Nutrition Management.  Bina returns today having lost 9 pounds. She declined Tanita body composition scale as she is wearing support/compression stockings. She reports her stomach issues are improving. She does not weigh herself at home.   Surgery date: 02/27/2015 Surgery type: RYGB Start weight at [facility]: 336 lbs on 02/11/2014 Weight today: 303.8 Weight change: 9 lbs Total weight lost: 32.2 lbs  TANITA  BODY COMP RESULTS  02/05/15 03/13/15 04/17/15   BMI (kg/m^2) N/A 50.4 N/A   Fat Mass (lbs)  154.5    Fat Free Mass (lbs)  158.0    Total Body Water (lbs)  115.5     Preferred Learning Style:   No preference indicated   Learning Readiness:   Ready   24-hr recall: B (AM): Premier protein shake (30g) Snk (AM): boiled egg, 1/2 piece [place] sausage (10g)  L (PM): Pinto beans, sometimes with green beans or turnip greens (7g)  Snk (PM): chicken salad (7g)  D (PM): 2 oz baked/broiled fish (14g) Snk (PM): sugar free jello and/or premier protein shake (30g)  Fluid intake: 34 oz Propel and water with sugar free flavoring, 22 oz of protein shake Estimated total protein intake: 98g  Medications: taking 1/2 blood pressure pill Supplementation: taking  CBG monitoring: morning and evening Average CBG per patient: 85-120 mg/dL Last patient reported A1c: scheduled in January  Using straws: no Drinking while eating: sometimes forgets Hair loss: unknown Carbonated beverages: none N/V/D/C: vomited 1x with chicken; constipation (taking Milk of Magnesia occasionally) Dumping syndrome: none  Recent physical activity:  Walking 30 minutes on days off  Progress Towards Goal(s):  In progress.  Handouts given during visit include:  Phase 3B lean protein + non starchy vegetables   Nutritional Diagnosis:   [place]-3.3 Overweight/obesity related to past poor dietary habits and physical inactivity as evidenced by patient w/ recent RYGB surgery following dietary guidelines for continued weight loss.     Intervention:  Nutrition counseling provided.  Teaching Method Utilized:  Visual Auditory Hands on  Barriers to learning/adherence to lifestyle change: none  Demonstrated degree of understanding via:  Teach Back   Monitoring/Evaluation:  Dietary intake, exercise, and body weight. Follow up in 1 months for 3 month post-op visit.

## 2015-04-17 NOTE — Patient Instructions (Addendum)
Goals:  Follow Phase 3B: High Protein + Non-Starchy Vegetables  Eat 3-6 small meals/snacks, every 3-5 hrs  Increase lean protein foods to meet 60g goal  Start having 1 protein shake per day instead of 2  Increase fluid intake to 64oz +  Avoid drinking 15 minutes before, during and 30 minutes after eating  Aim for >30 min of physical activity daily  Get a multivitamin with iron (Flintstones complete) OR add an iron supplement to take with vitamin  Surgery date: 02/27/2015 Surgery type: RYGB Start weight at [facility]: 336 lbs on 02/11/2014 Weight today: 303.8 Weight change: 9 lbs Total weight lost: 32.2 lbs

## 2015-04-25 LAB — HM DIABETES EYE EXAM

## 2015-05-02 ENCOUNTER — Ambulatory Visit (INDEPENDENT_AMBULATORY_CARE_PROVIDER_SITE_OTHER): Payer: 59 | Admitting: Podiatry

## 2015-05-02 ENCOUNTER — Encounter: Payer: Self-pay | Admitting: Podiatry

## 2015-05-02 DIAGNOSIS — M722 Plantar fascial fibromatosis: Secondary | ICD-10-CM

## 2015-05-03 NOTE — Progress Notes (Signed)
Subjective:     Patient ID: Tammy [name], female   [name]: [birth year], 54 y.o.   MRN: [id number]  HPI patient states I've lost a lot of weight I need to be active and exercising and I need new orthotics   Review of Systems     Objective:   Physical Exam Neurovascular status intact muscle strength adequate range of motion within normal limits with patient having discomfort in the plantar feet bilateral moderate in intensity and nonspecific in nature    Assessment:     Continue tendinitis plantar    Plan:     Advised on physical therapy shoe gear modifications and did scanned for a new orthotic at this time. Patient will be seen back when ready and we'll continue to try to lose weight at this time

## 2015-05-09 ENCOUNTER — Encounter: Payer: Self-pay | Admitting: Family Medicine

## 2015-05-23 ENCOUNTER — Ambulatory Visit: Payer: 59 | Admitting: *Deleted

## 2015-05-23 DIAGNOSIS — M722 Plantar fascial fibromatosis: Secondary | ICD-10-CM

## 2015-05-23 NOTE — Progress Notes (Signed)
Patient ID: Tammy [name], female   [name]: 12-07-1960, 55 y.o.   MRN: [id number] Patient presents for orthotic pick up.  Verbal and written break in and wear instructions given.  Patient will follow up in 4 weeks if symptoms worsen or fail to improve.

## 2015-05-23 NOTE — Patient Instructions (Signed)

## 2015-05-24 ENCOUNTER — Encounter: Payer: Self-pay | Admitting: Family Medicine

## 2015-05-24 ENCOUNTER — Ambulatory Visit (INDEPENDENT_AMBULATORY_CARE_PROVIDER_SITE_OTHER): Payer: 59 | Admitting: Family Medicine

## 2015-05-24 VITALS — BP 118/70 | HR 89 | Temp 98.4°F | Ht 66.0 in | Wt 298.8 lb

## 2015-05-24 DIAGNOSIS — J209 Acute bronchitis, unspecified: Secondary | ICD-10-CM | POA: Insufficient documentation

## 2015-05-24 MED ORDER — ALBUTEROL SULFATE HFA 108 (90 BASE) MCG/ACT IN AERS: 2.0000 | INHALATION_SPRAY | Freq: Four times a day (QID) | RESPIRATORY_TRACT | Status: DC | PRN

## 2015-05-24 MED ORDER — PROMETHAZINE-DM 6.25-15 MG/5ML PO SYRP: 5.0000 mL | ORAL_SOLUTION | Freq: Four times a day (QID) | ORAL | Status: DC | PRN

## 2015-05-24 NOTE — Patient Instructions (Signed)
Follow up as needed This appears to be a viral illness and should improve w/ time Drink plenty of fluids REST! Tea w/ honey for your voice Ibuprofen will also improve laryngitis Use the inhaler- 2 puffs every 4 hrs as needed for cough/shortness of breath Use the cough syrup for nights/weekends Call with any questions or concerns Hang in there!!!

## 2015-05-24 NOTE — Progress Notes (Signed)
Pre visit review using our clinic review tool, if applicable. No additional management support is needed unless otherwise documented below in the visit note. 

## 2015-05-24 NOTE — Progress Notes (Signed)
   Subjective:    Patient ID: Tammy [name], female    [name]: 04-07-1961, 55 y.o.   MRN: [id number]  HPI URI- sxs started on Monday w/ hoarseness.  + cough.  Intermittently productive.  No fevers.  Had initial sinus pain/pressure but improved w/ Claritin and Flonase.  R ear pain.  No N/V.  + SOB, fatigue.  Possible sick contacts at work.   Review of Systems For ROS see HPI     Objective:   Physical Exam  Constitutional: She appears well-developed and well-nourished. No distress.  obese  HENT:  Head: Normocephalic and atraumatic.  TMs normal bilaterally Mild nasal congestion Throat w/out erythema, edema, or exudate + laryngitis  Eyes: Conjunctivae and EOM are normal. Pupils are equal, round, and reactive to light.  Neck: Normal range of motion. Neck supple.  Cardiovascular: Normal rate, regular rhythm, normal heart sounds and intact distal pulses.   No murmur heard. Pulmonary/Chest: Effort normal. No respiratory distress. She has no wheezes.  Decreased air movement throughout- improved s/p neb tx in office  Lymphadenopathy:    She has no cervical adenopathy.  Vitals reviewed.         Assessment & Plan:

## 2015-05-24 NOTE — Assessment & Plan Note (Signed)
New.  Most likely viral as there is no evidence of bacterial infxn that would require abx.  Pt's air movement improved and SOB improved s/p neb tx.  Start Albuterol inhaler.  Cough meds prn.  Reviewed supportive care and red flags that should prompt return.  Pt expressed understanding and is in agreement w/ plan.

## 2015-05-29 ENCOUNTER — Encounter: Payer: 59 | Attending: Surgery | Admitting: Dietician

## 2015-05-29 ENCOUNTER — Encounter: Payer: Self-pay | Admitting: Dietician

## 2015-05-29 DIAGNOSIS — Z713 Dietary counseling and surveillance: Secondary | ICD-10-CM | POA: Insufficient documentation

## 2015-05-29 DIAGNOSIS — Z6841 Body Mass Index (BMI) 40.0 and over, adult: Secondary | ICD-10-CM | POA: Insufficient documentation

## 2015-05-29 NOTE — Progress Notes (Signed)
  Follow-up visit:  3 months Post-Operative RYGB Surgery  Medical Nutrition Therapy:  Appt start time: 1140 end time:  1210  Primary concerns today: Post-operative Bariatric Surgery Nutrition Management.  [name] returns today having lost 9 pounds. She declined Tanita body composition scale as she is wearing support/compression stockings. She reports her stomach issues are improving. She does not weigh herself at home.   Feeling dehydrated (dry lips and dark urine some days). Has been sick with cold/sinus issues. Cholesterol is improving. Blood sugars have been elevated lately in the evenings, likely associated with sickness. Not tolerating leafy vegetables (kale, turnip/collard greens, and cabbage) well. Has found that vegetables need to be well cooked and chopped.   Surgery date: 02/27/2015 Surgery type: RYGB Start weight at [facility]: 336 lbs on 02/11/2014 Weight today: 295.7 lbs Weight change: 8 lbs Total weight lost: 40 lbs  Preferred Learning Style:   No preference indicated   Learning Readiness:   Ready   24-hr recall: B (AM): 1/2 Premier protein shake and vitamins (15g) Snk (10 AM): 1/2 piece [place] sausage, sometimes boiled egg (5-10g)  L (PM): beans and sausage (14g) Snk (PM): rest of protein shake (15g) D (PM): 1-2 oz chicken teriyaki on salad with cheese (7-14g) Snk (PM):   Fluid intake: 34 oz water, sometimes decaf coffee, 11 oz of protein shake Estimated total protein intake: 56-68g  Medications: taking 1/2 blood pressure pill Supplementation: taking  CBG monitoring: morning and evening Average CBG per patient: 85-120 mg/dL Last patient reported A1c: scheduled in January  Using straws: no Drinking while eating: sometimes forgets Hair loss: yes Carbonated beverages: none N/V/D/C: vomited 1x recently with potato salad; constipation improving with regular vegetable intake Dumping syndrome: none  Recent physical activity:  Has not been as active  lately  Progress Towards Goal(s):  In progress.  Handouts given during visit include:    Nutritional Diagnosis:  [place]-3.3 Overweight/obesity related to past poor dietary habits and physical inactivity as evidenced by patient w/ recent RYGB surgery following dietary guidelines for continued weight loss.     Intervention:  Nutrition counseling provided.  Teaching Method Utilized:  Visual Auditory Hands on  Barriers to learning/adherence to lifestyle change: none  Demonstrated degree of understanding via:  Teach Back   Monitoring/Evaluation:  Dietary intake, exercise, and body weight. Follow up in 2 months for 5 month post-op visit.

## 2015-05-29 NOTE — Patient Instructions (Addendum)
Goals:  Follow Phase 3B: High Protein + Non-Starchy Vegetables  Eat 3-6 small meals/snacks, every 3-5 hrs  Increase lean protein foods to meet 60g goal  Increase fluid intake to 64oz +  Avoid drinking 15 minutes before, during and 30 minutes after eating  Aim for >30 min of physical activity daily  Try Citracal Petites (Calcium in tablet form)   Surgery date: 02/27/2015 Surgery type: RYGB Start weight at [facility]: 336 lbs on 02/11/2014 Weight today: 295.7 lbs Weight change: 8 lbs Total weight lost: 40 lbs

## 2015-06-15 ENCOUNTER — Encounter: Payer: Self-pay | Admitting: Family Medicine

## 2015-06-15 ENCOUNTER — Ambulatory Visit (INDEPENDENT_AMBULATORY_CARE_PROVIDER_SITE_OTHER): Payer: 59 | Admitting: Family Medicine

## 2015-06-15 VITALS — BP 132/76 | HR 71 | Temp 98.2°F | Ht 66.0 in | Wt 301.2 lb

## 2015-06-15 DIAGNOSIS — R490 Dysphonia: Secondary | ICD-10-CM

## 2015-06-15 NOTE — Progress Notes (Signed)
   Subjective:    Patient ID: Tammy [name], female    [name]: 05-07-1960, 55 y.o.   MRN: [id number]  HPI Hoarseness- pt reports voice is normal in the AM but will have progressive hoarseness as the day goes on.  No sore throat.  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  Taking protonix daily.  Pt had URI ~3 weeks ago.  Review of Systems For ROS see HPI     Objective:   Physical Exam  Constitutional: She appears well-developed and well-nourished. No distress.  HENT:  Head: Normocephalic and atraumatic.  Right Ear: Tympanic membrane normal.  Left Ear: Tympanic membrane normal.  Nose: Mucosal edema and rhinorrhea present. Right sinus exhibits no maxillary sinus tenderness and no frontal sinus tenderness. Left sinus exhibits no maxillary sinus tenderness and no frontal sinus tenderness.  Mouth/Throat: Mucous membranes are normal. Posterior oropharyngeal erythema (w/ PND) present.  Eyes: Conjunctivae and EOM are normal. Pupils are equal, round, and reactive to light.  Neck: Normal range of motion. Neck supple.  Cardiovascular: Normal rate, regular rhythm and normal heart sounds.   Pulmonary/Chest: Effort normal and breath sounds normal. No respiratory distress. She has no wheezes. She has no rales.  Lymphadenopathy:    She has no cervical adenopathy.  Vitals reviewed.         Assessment & Plan:

## 2015-06-15 NOTE — Patient Instructions (Signed)
Follow up by phone or MyChart on Monday/Tuesday to let me know how the voice is doing Drink plenty of fluids Continue the Protonix for acid reflux Increase the Flonase to daily for the next week Ibuprofen 400mg  (2 tabs) twice daily- take w/ food- for vocal cord inflammation REST your voice as much as possible Call with any questions or concerns Hang in there!!!

## 2015-06-15 NOTE — Progress Notes (Signed)
Pre visit review using our clinic review tool, if applicable. No additional management support is needed unless otherwise documented below in the visit note. 

## 2015-06-17 NOTE — Assessment & Plan Note (Signed)
New to provider, recurrent issue for pt.  Start ibuprofen for vocal cord inflammation.  Continue PPI.  Start nasal spray daily.  If no improvement, will need ENT referral for complete evaluation.  Pt expressed understanding and is in agreement w/ plan.

## 2015-06-24 ENCOUNTER — Encounter: Payer: Self-pay | Admitting: Family Medicine

## 2015-06-25 ENCOUNTER — Other Ambulatory Visit: Payer: 59

## 2015-06-25 ENCOUNTER — Ambulatory Visit: Payer: 59 | Admitting: Gynecology

## 2015-06-25 NOTE — Telephone Encounter (Signed)
Please schedule this patient an apt for Edema.     KP

## 2015-06-26 ENCOUNTER — Encounter: Payer: Self-pay | Admitting: Family Medicine

## 2015-06-26 ENCOUNTER — Ambulatory Visit (INDEPENDENT_AMBULATORY_CARE_PROVIDER_SITE_OTHER): Payer: 59 | Admitting: Family Medicine

## 2015-06-26 VITALS — BP 153/90 | HR 61 | Temp 97.9°F | Wt 300.2 lb

## 2015-06-26 DIAGNOSIS — R600 Localized edema: Secondary | ICD-10-CM

## 2015-06-26 MED ORDER — NONFORMULARY OR COMPOUNDED ITEM: Status: DC

## 2015-06-26 NOTE — Patient Instructions (Signed)
Edema °Edema is an abnormal buildup of fluids in your body tissues. Edema is somewhat dependent on gravity to pull the fluid to the lowest place in your body. That makes the condition more common in the legs and thighs (lower extremities). Painless swelling of the feet and ankles is common and becomes more likely as you get older. It is also common in looser tissues, like around your eyes.  °When the affected area is squeezed, the fluid may move out of that spot and leave a dent for a few moments. This dent is called pitting.  °CAUSES  °There are many possible causes of edema. Eating too much salt and being on your feet or sitting for a long time can cause edema in your legs and ankles. Hot weather may make edema worse. Common medical causes of edema include: °· Heart failure. °· Liver disease. °· Kidney disease. °· Weak blood vessels in your legs. °· Cancer. °· An injury. °· Pregnancy. °· Some medications. °· Obesity.  °SYMPTOMS  °Edema is usually painless. Your skin may look swollen or shiny.  °DIAGNOSIS  °Your health care provider may be able to diagnose edema by asking about your medical history and doing a physical exam. You may need to have tests such as X-rays, an electrocardiogram, or blood tests to check for medical conditions that may cause edema.  °TREATMENT  °Edema treatment depends on the cause. If you have heart, liver, or kidney disease, you need the treatment appropriate for these conditions. General treatment may include: °· Elevation of the affected body part above the level of your heart. °· Compression of the affected body part. Pressure from elastic bandages or support stockings squeezes the tissues and forces fluid back into the blood vessels. This keeps fluid from entering the tissues. °· Restriction of fluid and salt intake. °· Use of a water pill (diuretic). These medications are appropriate only for some types of edema. They pull fluid out of your body and make you urinate more often. This  gets rid of fluid and reduces swelling, but diuretics can have side effects. Only use diuretics as directed by your health care provider. °HOME CARE INSTRUCTIONS  °· Keep the affected body part above the level of your heart when you are lying down.   °· Do not sit still or stand for prolonged periods.   °· Do not put anything directly under your knees when lying down. °· Do not wear constricting clothing or garters on your upper legs.   °· Exercise your legs to work the fluid back into your blood vessels. This may help the swelling go down.   °· Wear elastic bandages or support stockings to reduce ankle swelling as directed by your health care provider.   °· Eat a low-salt diet to reduce fluid if your health care provider recommends it.   °· Only take medicines as directed by your health care provider.  °SEEK MEDICAL CARE IF:  °· Your edema is not responding to treatment. °· You have heart, liver, or kidney disease and notice symptoms of edema. °· You have edema in your legs that does not improve after elevating them.   °· You have sudden and unexplained weight gain. °SEEK IMMEDIATE MEDICAL CARE IF:  °· You develop shortness of breath or chest pain.   °· You cannot breathe when you lie down. °· You develop pain, redness, or warmth in the swollen areas.   °· You have heart, liver, or kidney disease and suddenly get edema. °· You have a fever and your symptoms suddenly get worse. °MAKE SURE YOU:  °·   Understand these instructions. °· Will watch your condition. °· Will get help right away if you are not doing well or get worse. °  °This information is not intended to replace advice given to you by your health care provider. Make sure you discuss any questions you have with your health care provider. °  °Document Released: 04/21/2005 Document Revised: 05/12/2014 Document Reviewed: 02/11/2013 °Elsevier Interactive Patient Education ©2016 [employer] ° °

## 2015-06-26 NOTE — Progress Notes (Signed)
Pre visit review using our clinic review tool, if applicable. No additional management support is needed unless otherwise documented below in the visit note. 

## 2015-06-26 NOTE — Progress Notes (Signed)
Patient ID: Tammy [name], female    [name]: [birth year]  Age: 55 y.o. MRN: [id number]    Subjective:  Subjective HPI Tammy [name] presents for swellling in both low ext.  No calf pain , no sob, no errythema and they do not feel hot per pt.   Pt is wearing compression socks occasionally but  Only has one pair.     Review of Systems  Constitutional: Negative for diaphoresis, appetite change, fatigue and unexpected weight change.  Eyes: Negative for pain, redness and visual disturbance.  Respiratory: Negative for cough, chest tightness, shortness of breath and wheezing.   Cardiovascular: Negative for chest pain, palpitations and leg swelling.  Endocrine: Negative for cold intolerance, heat intolerance, polydipsia, polyphagia and polyuria.  Genitourinary: Negative for dysuria, frequency and difficulty urinating.  Neurological: Negative for dizziness, light-headedness, numbness and headaches.    History Past Medical History  Diagnosis Date  . GERD (gastroesophageal reflux disease)   . Hypertension   . Hyperlipidemia   . Sinusitis   . Acute on chronic appendicitis s/p lap appy 06/10/2012 06/10/2012    surgery done 06-10-12  . Morbid obesity - BMI > 50 12/13/2012  . Pneumonia     none recent  . Diabetes mellitus     TYPE 2- no meds in several months-was taken off meds -monitoring blood sugar.  Marland Kitchen Poor venous access     "usually difficult stick"    She has past surgical history that includes Abdominal hysterectomy; Knee surgery (Right); Upper gastrointestinal endoscopy (04/14/2006); Colonoscopy (04/11/11); laparoscopic appendectomy (06/10/2012); Appendectomy; and Gastric Roux-En-Y (N/A, 02/27/2015).   Her family history includes Cancer (age of onset: 65) in her mother; Diabetes in her father and paternal grandmother; Hyperlipidemia in her brother and mother; Hypertension in her mother. There is no history of Colon cancer, Heart attack, or Sudden death.She reports that she has never smoked.  She has never used smokeless tobacco. She reports that she does not drink alcohol or use illicit drugs.  Current Outpatient Prescriptions on File Prior to Visit  Medication Sig Dispense Refill  . albuterol (PROVENTIL HFA;VENTOLIN HFA) 108 (90 Base) MCG/ACT inhaler Inhale 2 puffs into the lungs every 6 (six) hours as needed for wheezing or shortness of breath. 1 Inhaler 2  . amLODipine (NORVASC) 5 MG tablet Take 1 tablet (5 mg total) by mouth daily. 90 tablet 3  . Elastic Bandages & Supports (FUTURO SHEER SUPPORT HOSE) MISC 1 packet by Other route daily. 1 pair of support hose to wear daily 1 each 0  . fluticasone (FLONASE) 50 MCG/ACT nasal spray Place 2 sprays into both nostrils daily as needed for allergies.    . furosemide (LASIX) 40 MG tablet Take 1 tablet (40 mg total) by mouth daily. (Patient taking differently: Take 40 mg by mouth every other day. ) 90 tablet 1  . glucose blood test strip Check blood sugar twice daily 100 each 11  . loratadine (CLARITIN) 10 MG tablet Take 1 tablet (10 mg total) by mouth daily as needed for allergies.    . pantoprazole (PROTONIX) 40 MG tablet Take 2 tablets (80 mg total) by mouth daily. 180 tablet 3  . potassium chloride (KLOR-CON) 20 MEQ packet Take 20 mEq by mouth daily. 30 packet 5   No current facility-administered medications on file prior to visit.     Objective:  Objective Physical Exam  Constitutional: She is oriented to person, place, and time. She appears well-developed and well-nourished.  HENT:  Head: Normocephalic and atraumatic.  Eyes: Conjunctivae and EOM are normal.  Neck: Normal range of motion. Neck supple. No JVD present. Carotid bruit is not present. No thyromegaly present.  Cardiovascular: Normal rate, regular rhythm and normal heart sounds.   No murmur heard. Pulmonary/Chest: Effort normal and breath sounds normal. No respiratory distress. She has no wheezes. She has no rales. She exhibits no tenderness.  Musculoskeletal: She  exhibits edema. She exhibits no tenderness.  Neurological: She is alert and oriented to person, place, and time.  Psychiatric: She has a normal mood and affect.  Nursing note and vitals reviewed.  BP 153/90 mmHg  Pulse 61  Temp(Src) 97.9 F (36.6 C) (Oral)  Wt 300 lb 3.2 oz (136.17 kg)  SpO2 100% Wt Readings from Last 3 Encounters:  06/26/15 300 lb 3.2 oz (136.17 kg)  06/15/15 301 lb 3.2 oz (136.623 kg)  05/29/15 295 lb 11.2 oz (134.129 kg)     Lab Results  Component Value Date   WBC 5.5 03/01/2015   HGB 11.0* 03/01/2015   HCT 32.8* 03/01/2015   PLT 199 03/01/2015   GLUCOSE 83 03/21/2015   CHOL 164 03/21/2015   TRIG 60.0 03/21/2015   HDL 48.40 03/21/2015   LDLDIRECT 129.1 10/17/2009   LDLCALC 103* 03/21/2015   ALT 26 03/21/2015   AST 27 03/21/2015   NA 142 03/21/2015   K 3.8 03/21/2015   CL 101 03/21/2015   CREATININE 0.98 03/21/2015   BUN 14 03/21/2015   CO2 28 03/21/2015   TSH 3.181 02/21/2014   INR 1.1* 10/16/2014   HGBA1C 6.2 03/21/2015   MICROALBUR 0.58 10/22/2012    Mm Digital Screening Bilateral  04/18/2015  CLINICAL DATA:  Screening. EXAM: DIGITAL SCREENING BILATERAL MAMMOGRAM WITH CAD COMPARISON:  Previous exam(s). ACR Breast Density Category b: There are scattered areas of fibroglandular density. FINDINGS: There are no findings suspicious for malignancy. Images were processed with CAD. IMPRESSION: No mammographic evidence of malignancy. A result letter of this screening mammogram will be mailed directly to the patient. RECOMMENDATION: Screening mammogram in one year. (Code:SM-B-01Y) BI-RADS CATEGORY  1: Negative. Electronically Signed   By: Fidela Salisbury M.D.   On: 04/18/2015 07:46     Assessment & Plan:  Plan I have discontinued Ms. Drach's promethazine-dextromethorphan. I am also having her start on NONFORMULARY OR COMPOUNDED ITEM. Additionally, I am having her maintain her pantoprazole, loratadine, fluticasone, FUTURO SHEER SUPPORT HOSE,  glucose blood, furosemide, potassium chloride, amLODipine, and albuterol.  Meds ordered this encounter  Medications  . NONFORMULARY OR COMPOUNDED ITEM    Sig: Compression stockings 20-30 mm hg #1 dx-edema    Dispense:  1 each    Refill:  0    Problem List Items Addressed This Visit    None    Visit Diagnoses    Bilateral edema of lower extremity    -  Primary    Relevant Medications    NONFORMULARY OR COMPOUNDED ITEM       Follow-up: Return if symptoms worsen or fail to improve.  Garnet Koyanagi, DO

## 2015-06-27 NOTE — Assessment & Plan Note (Signed)
Elevated legs Wear compression stockings

## 2015-07-03 ENCOUNTER — Other Ambulatory Visit: Payer: Self-pay

## 2015-07-03 DIAGNOSIS — I890 Lymphedema, not elsewhere classified: Secondary | ICD-10-CM

## 2015-07-04 DIAGNOSIS — R600 Localized edema: Secondary | ICD-10-CM | POA: Insufficient documentation

## 2015-07-04 NOTE — Assessment & Plan Note (Signed)
Not responding to weight loss or diuretics Compression hose help very little and are extremely uncomfortable for pt Refer to pt --lymphedema clinic

## 2015-07-19 ENCOUNTER — Encounter: Payer: Self-pay | Admitting: Podiatry

## 2015-07-19 ENCOUNTER — Ambulatory Visit (INDEPENDENT_AMBULATORY_CARE_PROVIDER_SITE_OTHER): Payer: 59 | Admitting: Podiatry

## 2015-07-19 VITALS — BP 128/65 | HR 61 | Resp 16

## 2015-07-19 DIAGNOSIS — M779 Enthesopathy, unspecified: Secondary | ICD-10-CM

## 2015-07-19 MED ORDER — TRIAMCINOLONE ACETONIDE 10 MG/ML IJ SUSP
10.0000 mg | Freq: Once | INTRAMUSCULAR | Status: AC
  Administered 2015-07-19: 10 mg

## 2015-07-25 NOTE — Progress Notes (Signed)
Subjective:     Patient ID: Tammy [name], female   [name]: 07/25/1960, 55 y.o.   MRN: [id number]  HPI patient states I've lost approximately 70 pounds and I am trying to be very active in my right foot has started to hurt again   Review of Systems     Objective:   Physical Exam Neurovascular status intact muscle strength is adequate with severe depression of the arch noted bilateral with inflammation around the medial tendon posterior tib right at its insertion navicular and minimal discomfort in the plantar fascia. Patient has increased activity quite significantly    Assessment:     Tendinitis secondary to increase in activity levels    Plan:     Careful sheath injection administered 3 mg Kenalog 5 mg Xylocaine and advised on physical therapy anti-inflammatories and possibility for AFO type device if symptoms persist. Patient will be seen back to reevaluate in the next 4 weeks or earlier if needed

## 2015-07-30 ENCOUNTER — Other Ambulatory Visit: Payer: Self-pay | Admitting: Family Medicine

## 2015-08-01 ENCOUNTER — Encounter: Payer: Self-pay | Admitting: Dietician

## 2015-08-01 ENCOUNTER — Encounter: Payer: 59 | Attending: Surgery | Admitting: Dietician

## 2015-08-01 DIAGNOSIS — Z6841 Body Mass Index (BMI) 40.0 and over, adult: Secondary | ICD-10-CM | POA: Insufficient documentation

## 2015-08-01 DIAGNOSIS — Z713 Dietary counseling and surveillance: Secondary | ICD-10-CM | POA: Diagnosis not present

## 2015-08-01 NOTE — Patient Instructions (Addendum)
Goals:  Follow Phase 3B: High Protein + Non-Starchy Vegetables  Eat 3-6 small meals/snacks, every 3-5 hrs  Increase lean protein foods to meet 60g goal  Increase fluid intake to 64oz +  Avoid drinking 15 minutes before, during and 30 minutes after eating  Continue exercise routine  Try Citracal Petites (Calcium in tablet form)  Aim for 1 serving of fruit per day or less  Avoid bread and crackers and granola  Keep logging foods  Keep some easy, high protein snacks on hand  Boiled eggs, egg/chicken salad, deli meat, [place] sausage, Jimmy Dean fritattas, Quest protein chips   Surgery date: 02/27/2015 Surgery type: RYGB Start weight at [facility]: 336 lbs on 02/11/2014 (highest weight 355 lbs per patient) Weight today: 291.8 lbs Weight change: 3 lbs Total weight lost: 43 lbs (63.2 lbs)

## 2015-08-01 NOTE — Progress Notes (Signed)
  Follow-up visit:  5 months Post-Operative RYGB Surgery  Medical Nutrition Therapy:  Appt start time: 1140 end time:  1215  Primary concerns today: Post-operative Bariatric Surgery Nutrition Management.  Tammy [name] returns today having lost 3 pounds. She has been having trouble with swelling in lower extremities. Also had shots in feet and she states that this makes her crave foods. She is feeling depressed about her lack of weight loss. Has added carbs like crackers, bread, granola, and fruit, sometimes eats 2 bananas a day. She states that she weighed 274 lbs at home about a month ago. Blood pressure and blood sugars have been good.   Surgery date: 02/27/2015 Surgery type: RYGB Start weight at [facility]: 336 lbs on 02/11/2014 (highest weight 355 lbs per patient) Weight today: 291.8 lbs Weight change: 3 lbs Total weight lost: 43 lbs (63.2 lbs)  Preferred Learning Style:   No preference indicated   Learning Readiness:   Ready   24-hr recall: B (AM): 1/2 Premier protein shake and vitamins (15g) Snk (10 AM): rest of protein shake, boiled egg, handful of cheerios, and a banana (21g) L (PM): steak, green beans, stewed apples, almonds (21g) Snk (PM): banana, crackers, peanuts Snk (PM): yogurt with granola and peach D (PM): 1-2 oz chicken teriyaki on salad with cheese (7-14g) Snk (PM):   Fluid intake: 70 oz water, sometimes decaf coffee, 11 oz of protein shake Estimated total protein intake: 60+ g/day  Medications: taking 1/2 blood pressure pill Supplementation: taking  CBG monitoring: morning and evening Average CBG per patient: 89-110 mg/dL Last patient reported A1c: scheduled in January  Using straws: no Drinking while eating: sometimes forgets Hair loss: yes Carbonated beverages: none N/V/D/C: vomited 1x recently with potato salad; constipation improving with regular vegetable intake Dumping syndrome: none  Recent physical activity:  Walking and biking (45 minutes on days  off)  Progress Towards Goal(s):  In progress.  Handouts given during visit include:    Nutritional Diagnosis:  [place]-3.3 Overweight/obesity related to past poor dietary habits and physical inactivity as evidenced by patient w/ recent RYGB surgery following dietary guidelines for continued weight loss.     Intervention:  Nutrition counseling provided. Goals:  Follow Phase 3B: High Protein + Non-Starchy Vegetables  Eat 3-6 small meals/snacks, every 3-5 hrs  Increase lean protein foods to meet 60g goal  Increase fluid intake to 64oz +  Avoid drinking 15 minutes before, during and 30 minutes after eating  Continue exercise routine  Try Citracal Petites (Calcium in tablet form)  Aim for 1 serving of fruit per day or less  Avoid bread and crackers and granola  Keep logging foods  Keep some easy, high protein snacks on hand  Boiled eggs, egg/chicken salad, deli meat, [place] sausage, Jimmy Dean fritattas, Quest protein chips  Teaching Method Utilized:  Visual Auditory Hands on  Barriers to learning/adherence to lifestyle change: none  Demonstrated degree of understanding via:  Teach Back   Monitoring/Evaluation:  Dietary intake, exercise, and body weight. Follow up in 2.5 months for 6.5 month post-op visit.

## 2015-08-15 ENCOUNTER — Ambulatory Visit (INDEPENDENT_AMBULATORY_CARE_PROVIDER_SITE_OTHER): Payer: 59 | Admitting: Podiatry

## 2015-08-15 ENCOUNTER — Encounter: Payer: Self-pay | Admitting: Podiatry

## 2015-08-15 DIAGNOSIS — M779 Enthesopathy, unspecified: Secondary | ICD-10-CM | POA: Diagnosis not present

## 2015-08-15 MED ORDER — TRIAMCINOLONE ACETONIDE 10 MG/ML IJ SUSP
10.0000 mg | Freq: Once | INTRAMUSCULAR | Status: AC
  Administered 2015-08-15: 10 mg

## 2015-08-16 ENCOUNTER — Ambulatory Visit (HOSPITAL_BASED_OUTPATIENT_CLINIC_OR_DEPARTMENT_OTHER)
Admission: RE | Admit: 2015-08-16 | Discharge: 2015-08-16 | Disposition: A | Discharge status: Home / Self Care | Payer: 59 | Source: Ambulatory Visit | Attending: Family Medicine | Admitting: Family Medicine

## 2015-08-16 ENCOUNTER — Telehealth: Payer: Self-pay | Admitting: *Deleted

## 2015-08-16 ENCOUNTER — Encounter: Payer: Self-pay | Admitting: Family Medicine

## 2015-08-16 ENCOUNTER — Ambulatory Visit (INDEPENDENT_AMBULATORY_CARE_PROVIDER_SITE_OTHER): Payer: 59 | Admitting: Family Medicine

## 2015-08-16 VITALS — BP 118/60 | HR 91 | Temp 99.9°F | Wt 285.6 lb

## 2015-08-16 DIAGNOSIS — R059 Cough, unspecified: Secondary | ICD-10-CM

## 2015-08-16 DIAGNOSIS — R05 Cough: Secondary | ICD-10-CM

## 2015-08-16 DIAGNOSIS — J189 Pneumonia, unspecified organism: Secondary | ICD-10-CM | POA: Insufficient documentation

## 2015-08-16 DIAGNOSIS — I10 Essential (primary) hypertension: Secondary | ICD-10-CM | POA: Diagnosis not present

## 2015-08-16 MED ORDER — AMLODIPINE BESYLATE 5 MG PO TABS: 5.0000 mg | ORAL_TABLET | Freq: Every day | ORAL | Status: DC

## 2015-08-16 MED ORDER — LEVOFLOXACIN 500 MG PO TABS: 500.0000 mg | ORAL_TABLET | Freq: Every day | ORAL | Status: DC

## 2015-08-16 NOTE — Progress Notes (Signed)
Subjective:     Patient ID: Tammy [name], female   [name]: 1961-03-09, 55 y.o.   MRN: [id number]  HPI patient states I'm continuing to lose weight and I'm still getting a lot of pain on the inside of his ankle and is hoping maybe 1 more injection which get it better   Review of Systems     Objective:   Physical Exam Neurovascular status intact with patient being significantly obese with lower extremity lymphatic edema with discomfort posterior tibial tendon near its insertion into the navicular    Assessment:     Continue tendinitis with patient that's difficult because we cannot make her any kind of brace systems or other modalities    Plan:     Careful sheath injection readministered and advised on not going barefoot and trying to wear support and patient be seen back as needed and may require other treatments depending on response

## 2015-08-16 NOTE — Telephone Encounter (Signed)
-----   Message from [name], DO sent at 08/16/2015  3:11 PM EDT ----- + pneumonia----  Take antibiotic--- recheck patient in 2 weeks or sooner prn

## 2015-08-16 NOTE — Progress Notes (Signed)
Patient ID: Tammy [name], female    [name]: Apr 20, 1961  Age: 55 y.o. MRN: [id number]    Subjective:  Subjective HPI Tammy [name] presents for cough and sob x 3 days.  + fevers and body aches.  No otc meds. + sinus pressure and congestion,  + productive cough.  No chest pain.    Review of Systems  Constitutional: Positive for fever and chills.  HENT: Positive for congestion, postnasal drip, rhinorrhea, sinus pressure and sore throat.   Respiratory: Positive for cough, chest tightness and shortness of breath. Negative for wheezing.   Cardiovascular: Negative for chest pain, palpitations and leg swelling.  Allergic/Immunologic: Negative for environmental allergies.    History Past Medical History  Diagnosis Date  . GERD (gastroesophageal reflux disease)   . Hypertension   . Hyperlipidemia   . Sinusitis   . Acute on chronic appendicitis s/p lap appy 06/10/2012 06/10/2012    surgery done 06-10-12  . Morbid obesity - BMI > 50 12/13/2012  . Pneumonia     none recent  . Diabetes mellitus     TYPE 2- no meds in several months-was taken off meds -monitoring blood sugar.  Marland Kitchen Poor venous access     "usually difficult stick"    She has past surgical history that includes Abdominal hysterectomy; Knee surgery (Right); Upper gastrointestinal endoscopy (04/14/2006); Colonoscopy (04/11/11); laparoscopic appendectomy (06/10/2012); Appendectomy; and Gastric Roux-En-Y (N/A, 02/27/2015).   Her family history includes Cancer (age of onset: 79) in her mother; Diabetes in her father and paternal grandmother; Hyperlipidemia in her brother and mother; Hypertension in her mother. There is no history of Colon cancer, Heart attack, or Sudden death.She reports that she has never smoked. She has never used smokeless tobacco. She reports that she does not drink alcohol or use illicit drugs.  Current Outpatient Prescriptions on File Prior to Visit  Medication Sig Dispense Refill  . albuterol (PROVENTIL  HFA;VENTOLIN HFA) 108 (90 Base) MCG/ACT inhaler Inhale 2 puffs into the lungs every 6 (six) hours as needed for wheezing or shortness of breath. 1 Inhaler 2  . Elastic Bandages & Supports (FUTURO SHEER SUPPORT HOSE) MISC 1 packet by Other route daily. 1 pair of support hose to wear daily 1 each 0  . fluticasone (FLONASE) 50 MCG/ACT nasal spray Place 2 sprays into both nostrils daily as needed for allergies.    . furosemide (LASIX) 40 MG tablet Take 1 tablet (40 mg total) by mouth daily. (Patient taking differently: Take 40 mg by mouth every other day. ) 90 tablet 1  . glucose blood test strip Check blood sugar twice daily 100 each 11  . loratadine (CLARITIN) 10 MG tablet Take 1 tablet (10 mg total) by mouth daily as needed for allergies.    . NONFORMULARY OR COMPOUNDED ITEM Compression stockings 20-30 mm hg #1 dx-edema 1 each 0  . pantoprazole (PROTONIX) 40 MG tablet Take 2 tablets by mouth  daily 180 tablet 0  . potassium chloride (KLOR-CON) 20 MEQ packet Take 20 mEq by mouth daily. 30 packet 5   No current facility-administered medications on file prior to visit.     Objective:  Objective Physical Exam  Constitutional: She is oriented to person, place, and time. She appears well-developed and well-nourished.  HENT:  Head: Normocephalic and atraumatic.  Right Ear: External ear normal.  Left Ear: External ear normal.  + PND + errythema  Eyes: Conjunctivae and EOM are normal. Right eye exhibits no discharge. Left eye exhibits no discharge.  Neck: Normal range of motion. Neck supple. No JVD present. Carotid bruit is not present. No thyromegaly present.  Cardiovascular: Normal rate, regular rhythm and normal heart sounds.   No murmur heard. Pulmonary/Chest: Effort normal. No respiratory distress. She has decreased breath sounds in the right lower field. She has no wheezes. She has no rhonchi. She has rales in the right lower field. She exhibits no tenderness.  Musculoskeletal: She exhibits  no edema.  Lymphadenopathy:    She has cervical adenopathy.  Neurological: She is alert and oriented to person, place, and time.  Psychiatric: She has a normal mood and affect.  Nursing note and vitals reviewed.  BP 118/60 mmHg  Pulse 91  Temp(Src) 99.9 F (37.7 C) (Oral)  Wt 285 lb 9.6 oz (129.547 kg)  SpO2 93% Wt Readings from Last 3 Encounters:  08/16/15 285 lb 9.6 oz (129.547 kg)  08/01/15 291 lb 12.8 oz (132.36 kg)  06/26/15 300 lb 3.2 oz (136.17 kg)     Lab Results  Component Value Date   WBC 5.5 03/01/2015   HGB 11.0* 03/01/2015   HCT 32.8* 03/01/2015   PLT 199 03/01/2015   GLUCOSE 83 03/21/2015   CHOL 164 03/21/2015   TRIG 60.0 03/21/2015   HDL 48.40 03/21/2015   LDLDIRECT 129.1 10/17/2009   LDLCALC 103* 03/21/2015   ALT 26 03/21/2015   AST 27 03/21/2015   NA 142 03/21/2015   K 3.8 03/21/2015   CL 101 03/21/2015   CREATININE 0.98 03/21/2015   BUN 14 03/21/2015   CO2 28 03/21/2015   TSH 3.181 02/21/2014   INR 1.1* 10/16/2014   HGBA1C 6.2 03/21/2015   MICROALBUR 0.58 10/22/2012    Mm Digital Screening Bilateral  04/18/2015  CLINICAL DATA:  Screening. EXAM: DIGITAL SCREENING BILATERAL MAMMOGRAM WITH CAD COMPARISON:  Previous exam(s). ACR Breast Density Category b: There are scattered areas of fibroglandular density. FINDINGS: There are no findings suspicious for malignancy. Images were processed with CAD. IMPRESSION: No mammographic evidence of malignancy. A result letter of this screening mammogram will be mailed directly to the patient. RECOMMENDATION: Screening mammogram in one year. (Code:SM-B-01Y) BI-RADS CATEGORY  1: Negative. Electronically Signed   By: Fidela Salisbury M.D.   On: 04/18/2015 07:46     Assessment & Plan:  Plan I am having Ms. Wideman start on levofloxacin. I am also having her maintain her loratadine, fluticasone, FUTURO SHEER SUPPORT HOSE, glucose blood, furosemide, potassium chloride, albuterol, NONFORMULARY OR COMPOUNDED ITEM,  pantoprazole, and amLODipine.  Meds ordered this encounter  Medications  . amLODipine (NORVASC) 5 MG tablet    Sig: Take 1 tablet (5 mg total) by mouth daily.    Dispense:  90 tablet    Refill:  3  . levofloxacin (LEVAQUIN) 500 MG tablet    Sig: Take 1 tablet (500 mg total) by mouth daily.    Dispense:  10 tablet    Refill:  0    Problem List Items Addressed This Visit      Unprioritized   Essential hypertension - Primary   Relevant Medications   amLODipine (NORVASC) 5 MG tablet    Other Visit Diagnoses    Cough        Relevant Orders    DG Chest 2 View    CAP (community acquired pneumonia)        Relevant Medications    levofloxacin (LEVAQUIN) 500 MG tablet     con't flonase, antihistamine and cough med--- start abx--- call or rto if you need  anything Follow-up: Return in about 2 weeks (around 08/30/2015), or if symptoms worsen or fail to improve, for f/u pneumonia.  [name], DO

## 2015-08-16 NOTE — Patient Instructions (Signed)

## 2015-08-16 NOTE — Progress Notes (Signed)
Pre visit review using our clinic review tool, if applicable. No additional management support is needed unless otherwise documented below in the visit note. 

## 2015-08-16 NOTE — Telephone Encounter (Signed)
Notified pt and placed xray order.

## 2015-08-18 ENCOUNTER — Emergency Department (HOSPITAL_COMMUNITY)
Admission: EM | Admit: 2015-08-18 | Discharge: 2015-08-18 | Disposition: A | Discharge status: Home / Self Care | Payer: 59 | Attending: Emergency Medicine | Admitting: Emergency Medicine

## 2015-08-18 ENCOUNTER — Encounter (HOSPITAL_COMMUNITY): Payer: Self-pay | Admitting: Emergency Medicine

## 2015-08-18 DIAGNOSIS — Z792 Long term (current) use of antibiotics: Secondary | ICD-10-CM | POA: Diagnosis not present

## 2015-08-18 DIAGNOSIS — K219 Gastro-esophageal reflux disease without esophagitis: Secondary | ICD-10-CM | POA: Diagnosis not present

## 2015-08-18 DIAGNOSIS — R202 Paresthesia of skin: Secondary | ICD-10-CM | POA: Insufficient documentation

## 2015-08-18 DIAGNOSIS — Z9884 Bariatric surgery status: Secondary | ICD-10-CM | POA: Diagnosis not present

## 2015-08-18 DIAGNOSIS — T368X5A Adverse effect of other systemic antibiotics, initial encounter: Secondary | ICD-10-CM | POA: Diagnosis not present

## 2015-08-18 DIAGNOSIS — I10 Essential (primary) hypertension: Secondary | ICD-10-CM | POA: Diagnosis not present

## 2015-08-18 DIAGNOSIS — Z7951 Long term (current) use of inhaled steroids: Secondary | ICD-10-CM | POA: Diagnosis not present

## 2015-08-18 DIAGNOSIS — Z79899 Other long term (current) drug therapy: Secondary | ICD-10-CM | POA: Diagnosis not present

## 2015-08-18 DIAGNOSIS — J159 Unspecified bacterial pneumonia: Secondary | ICD-10-CM | POA: Diagnosis not present

## 2015-08-18 DIAGNOSIS — Z8701 Personal history of pneumonia (recurrent): Secondary | ICD-10-CM | POA: Insufficient documentation

## 2015-08-18 DIAGNOSIS — E119 Type 2 diabetes mellitus without complications: Secondary | ICD-10-CM | POA: Diagnosis not present

## 2015-08-18 DIAGNOSIS — T50905A Adverse effect of unspecified drugs, medicaments and biological substances, initial encounter: Secondary | ICD-10-CM

## 2015-08-18 DIAGNOSIS — J189 Pneumonia, unspecified organism: Secondary | ICD-10-CM

## 2015-08-18 LAB — BASIC METABOLIC PANEL
Anion gap: 10 (ref 5–15)
BUN: 10 mg/dL (ref 6–20)
CO2: 25 mmol/L (ref 22–32)
CREATININE: 0.79 mg/dL (ref 0.44–1.00)
Calcium: 8.4 mg/dL — ABNORMAL LOW (ref 8.9–10.3)
Chloride: 109 mmol/L (ref 101–111)
Glucose, Bld: 90 mg/dL (ref 65–99)
POTASSIUM: 3.6 mmol/L (ref 3.5–5.1)
SODIUM: 144 mmol/L (ref 135–145)

## 2015-08-18 LAB — CBC
HEMATOCRIT: 36.3 % (ref 36.0–46.0)
Hemoglobin: 12.3 g/dL (ref 12.0–15.0)
MCH: 29.1 pg (ref 26.0–34.0)
MCHC: 33.9 g/dL (ref 30.0–36.0)
MCV: 85.8 fL (ref 78.0–100.0)
Platelets: 205 10*3/uL (ref 150–400)
RBC: 4.23 MIL/uL (ref 3.87–5.11)
RDW: 13.2 % (ref 11.5–15.5)
WBC: 2.6 10*3/uL — ABNORMAL LOW (ref 4.0–10.5)

## 2015-08-18 MED ORDER — AMOXICILLIN-POT CLAVULANATE 875-125 MG PO TABS: 1.0000 | ORAL_TABLET | Freq: Two times a day (BID) | ORAL | Status: DC

## 2015-08-18 MED ORDER — DOXYCYCLINE HYCLATE 100 MG PO CAPS: 100.0000 mg | ORAL_CAPSULE | Freq: Two times a day (BID) | ORAL | Status: DC

## 2015-08-18 MED ORDER — CALCIUM CARBONATE ANTACID 500 MG PO CHEW
1.0000 | CHEWABLE_TABLET | Freq: Once | ORAL | Status: AC
  Administered 2015-08-18: 200 mg via ORAL
  Filled 2015-08-18: qty 1

## 2015-08-18 NOTE — ED Notes (Addendum)
Pt reported that she was seen via PMD and dx with PNA and given Levaquin po. Pt reported on Friday she started having side effects of tingling in rt arm, burning in chest, and ankle popping from taking ABT and was instructed if she has these symptoms to f/u with MD. No rash/hives, dyspnea, or difficulty swallowing noted.

## 2015-08-18 NOTE — Discharge Instructions (Signed)
Return to the ED with any concerns including difficulty breathing, vomiting and not able to keep down liquids or antibiotics, abdominal pain, decreased level of alertness/lethargy, or any other alarming symptoms  The 2 antibiotics prescribed today take the place of the levaquin that you were prescribed by your primary doctor

## 2015-08-18 NOTE — ED Provider Notes (Signed)
CSN: [id number]     Arrival date & time 08/18/15  1010 History   First MD Initiated Contact with Patient 08/18/15 1032     Chief Complaint  Patient presents with  . Medication Reaction     (Consider location/radiation/quality/duration/timing/severity/associated sxs/prior Treatment) HPI  Pt presenting with c/o concern for reaction to levaquin.  She was seen by her PMD yesterday and started on levaquin for pneumonia.  She states that approx 2 hours after taking first dose of levaquin she began feel tingling in her arms and legs, popping in her right ankle.  States she felt like her chest was on fire and burning.  She states all these side effects were listed on the antibiotic and she feels this antibiotic is too strong for her to take.  She denies having any difficulty breathing, no lip or tongue swelling, no rash.  No vomiting or fever.  She states that the majority of these symptoms have resolved at this time but she does not wish to take this medication and was worried if her pneumonia would get worse without treatment.  There are no other associated systemic symptoms, there are no other alleviating or modifying factors.   Past Medical History  Diagnosis Date  . GERD (gastroesophageal reflux disease)   . Hypertension   . Hyperlipidemia   . Sinusitis   . Acute on chronic appendicitis s/p lap appy 06/10/2012 06/10/2012    surgery done 06-10-12  . Morbid obesity - BMI > 50 12/13/2012  . Pneumonia     none recent  . Diabetes mellitus     TYPE 2- no meds in several months-was taken off meds -monitoring blood sugar.  Marland Kitchen Poor venous access     "usually difficult stick"   Past Surgical History  Procedure Laterality Date  . Abdominal hysterectomy    . Knee surgery Right      KNEE SURGERY   . Upper gastrointestinal endoscopy  04/14/2006    Normal  . Colonoscopy  04/11/11    5 mm polyp removed but not recovered  . Laparoscopic appendectomy  06/10/2012    Procedure: APPENDECTOMY LAPAROSCOPIC;   Surgeon: Adin Hector, MD;  Location: [facility];  Service: General;  Laterality: N/A;  . Appendectomy      2'14  . Gastric roux-en-y N/A 02/27/2015    Procedure: LAPAROSCOPIC ROUX-EN-Y GASTRIC BYPASS WITH UPPER ENDOSCOPY;  Surgeon: Johnathan Hausen, MD;  Location: [facility];  Service: General;  Laterality: N/A;   Family History  Problem Relation Age of Onset  . Hyperlipidemia Mother   . Hypertension Mother   . Cancer Mother 25    hodgkins lymphoma  . Diabetes Father   . Hyperlipidemia Brother   . Colon cancer Neg Hx   . Heart attack Neg Hx   . Sudden death Neg Hx   . Diabetes Paternal Grandmother    Social History  Substance Use Topics  . Smoking status: Never Smoker   . Smokeless tobacco: Never Used  . Alcohol Use: No   OB History    No data available     Review of Systems  ROS reviewed and all otherwise negative except for mentioned in HPI    Allergies  Ace inhibitors; Cheese; Red dye; and Tuna  Home Medications   Prior to Admission medications   Medication Sig Start Date End Date Taking? Authorizing Provider  albuterol (PROVENTIL HFA;VENTOLIN HFA) 108 (90 Base) MCG/ACT inhaler Inhale 2 puffs into the lungs every 6 (six) hours as needed for wheezing  or shortness of breath. 05/24/15   Midge Minium, MD  amLODipine (NORVASC) 5 MG tablet Take 1 tablet (5 mg total) by mouth daily. 08/16/15   Rosalita Chessman Chase, DO  amoxicillin-clavulanate (AUGMENTIN) 875-125 MG tablet Take 1 tablet by mouth every 12 (twelve) hours. 08/18/15   Alfonzo Beers, MD  doxycycline (VIBRAMYCIN) 100 MG capsule Take 1 capsule (100 mg total) by mouth 2 (two) times daily. 08/18/15   Alfonzo Beers, MD  Elastic Bandages & Supports (FUTURO SHEER SUPPORT HOSE) MISC 1 packet by Other route daily. 1 pair of support hose to wear daily 02/02/15   Rosalita Chessman Chase, DO  fluticasone [facility]) 50 MCG/ACT nasal spray Place 2 sprays into both nostrils daily as needed for allergies. 01/29/15   Modena Jansky, MD   furosemide (LASIX) 40 MG tablet Take 1 tablet (40 mg total) by mouth daily. Patient taking differently: Take 40 mg by mouth every other day.  02/20/15 06/18/16  Rosalita Chessman Chase, DO  glucose blood test strip Check blood sugar twice daily 02/02/15   Rosalita Chessman Chase, DO  levofloxacin (LEVAQUIN) 500 MG tablet Take 1 tablet (500 mg total) by mouth daily. 08/16/15   Rosalita Chessman Chase, DO  loratadine (CLARITIN) 10 MG tablet Take 1 tablet (10 mg total) by mouth daily as needed for allergies. 01/29/15   Modena Jansky, MD  NONFORMULARY OR COMPOUNDED ITEM Compression stockings 20-30 mm hg #1 dx-edema 06/26/15   Rosalita Chessman Chase, DO  pantoprazole (PROTONIX) 40 MG tablet Take 2 tablets by mouth  daily 07/30/15   Rosalita Chessman Chase, DO  potassium chloride (KLOR-CON) 20 MEQ packet Take 20 mEq by mouth daily. 03/06/15   Yvonne R Lowne Chase, DO   BP 134/79 mmHg  Pulse 75  Temp(Src) 97.9 F (36.6 C) (Oral)  Resp 18  Ht 5\' 6"  (1.676 m)  Wt 280 lb (127.007 kg)  BMI 45.21 kg/m2  SpO2 95%  Vitals reviewed Physical Exam  Physical Examination: General appearance - alert, well appearing, and in no distress Mental status - alert, oriented to person, place, and time Eyes - no conjunctival injection no scleral icterus Mouth - mucous membranes moist, pharynx normal without lesions Chest - clear to auscultation, no wheezes, rales or rhonchi, symmetric air entry Heart - normal rate, regular rhythm, normal S1, S2, no murmurs, rubs, clicks or gallops Abdomen - soft, nontender, nondistended, no masses or organomegaly Neurological - alert, oriented, normal speech, nor cranial nerve defect, strength 5/5 in extremities x 4, sensation intact Extremities - peripheral pulses normal, no pedal edema, no clubbing or cyanosis Skin - normal coloration and turgor, no rashes  ED Course  Procedures (including critical care time) Labs Review Labs Reviewed  CBC - Abnormal; Notable for the following:    WBC 2.6  (*)    All other components within normal limits  BASIC METABOLIC PANEL - Abnormal; Notable for the following:    Calcium 8.4 (*)    All other components within normal limits    Imaging Review No results found. I have personally reviewed and evaluated these images and lab results as part of my medical decision-making.   EKG Interpretation   Date/Time:  Saturday August 18 2015 11:05:45 EDT Ventricular Rate:  61 PR Interval:  150 QRS Duration: 86 QT Interval:  437 QTC Calculation: 440 R Axis:   32 Text Interpretation:  Sinus rhythm Low voltage, precordial leads Abnormal  R-wave progression, early transition No significant change  since last  tracing Confirmed by [facility]  MD, [place] [phone]) on 08/18/2015 12:07:03 PM      MDM   Final diagnoses:  Community acquired pneumonia  Medication side effect, initial encounter    Pt presenting with c/o unpleasant symptoms after taking levaquin- not a true allergy.  She is no longer willing to take this medication and requests a different antibiotic, CXR from clinic visit reviewed and does show pneumonia.  Pt has no hypoxia or tachypnea- she is in no distress- feel outpatient management is reasonable.  Will need double coverage with augmentin and doxycycline.  Prescriptions given.  Discharged with strict return precautions.  Pt agreeable with plan.    Alfonzo Beers, MD 08/19/15 [phone]

## 2015-08-18 NOTE — ED Notes (Signed)
Attempted blood draw with no success.     

## 2015-08-18 NOTE — ED Notes (Signed)
I ATTEMPTED TO COLLECT LABS AND WAS UNSUCCESSFUL. 

## 2015-08-18 NOTE — ED Notes (Signed)
Awake. Verbally responsive. A/O x4. Resp even and unlabored. No audible adventitious breath sounds noted. ABC's intact.  

## 2015-08-20 ENCOUNTER — Telehealth: Payer: Self-pay | Admitting: Family Medicine

## 2015-08-20 DIAGNOSIS — D72829 Elevated white blood cell count, unspecified: Secondary | ICD-10-CM

## 2015-08-20 NOTE — Telephone Encounter (Signed)
Ok to give a note.

## 2015-08-20 NOTE — Telephone Encounter (Signed)
She is calling for her lab results from the hospital. Please advise    KP

## 2015-08-20 NOTE — Telephone Encounter (Signed)
Er note send it was levaquin not amolodopine?  Is that correct?+

## 2015-08-20 NOTE — Telephone Encounter (Signed)
Please advise      KP 

## 2015-08-20 NOTE — Telephone Encounter (Signed)
Repeat cbc when abx finished------  They were essentially normal except for wbc

## 2015-08-20 NOTE — Telephone Encounter (Signed)
Relation to [id number] Call back number:[phone]   Reason for call:  Patient was last seen 08/16/15 and states amLODipine (NORVASC) 5 MG tablet had her "jerking" patient stopped medication.   Due to symtomps not improving patient went to ED 08/18/15 and amoxicillin-clavulanate (AUGMENTIN) 875-125 MG tablet and doxycycline (VIBRAMYCIN) 100 MG capsule 5-125 MG tablet was prescribed. Patient refuse to schedule ED follow up at this time due to financial issues. In addition patient is inquiring about labs drawn in the ED on 08/18/15.

## 2015-08-20 NOTE — Telephone Encounter (Signed)
I discussed with patient and she stated, the Levaquin made her sick, I advised of her CBC results and she voiced understanding,  She is still feeling bad and needs a work note for Wed and Thursday of this week, these are the only two days that she works this week.  Please advise     KP

## 2015-08-21 NOTE — Telephone Encounter (Signed)
Patient informed. 

## 2015-08-21 NOTE — Telephone Encounter (Signed)
Would you call the patient and make her aware her letter is ready for pick up.       KP

## 2015-10-25 ENCOUNTER — Encounter: Payer: Self-pay | Admitting: Dietician

## 2015-10-25 ENCOUNTER — Encounter: Payer: 59 | Attending: Family Medicine | Admitting: Dietician

## 2015-10-25 DIAGNOSIS — Z029 Encounter for administrative examinations, unspecified: Secondary | ICD-10-CM | POA: Diagnosis present

## 2015-10-25 NOTE — Progress Notes (Signed)
  Follow-up visit:  8 months Post-Operative RYGB Surgery  Medical Nutrition Therapy:  Appt start time: 200 end time:  235  Primary concerns today: Post-operative Bariatric Surgery Nutrition Management.  Tammy [name] returns today having lost another 11 pounds. Feels like her previous lack of weight loss was due to steroids. Started taking Miralax for constipation and felt stomach pain and has resumed taking Milk of Magnesia. No longer having any vomiting. Plans to go to the [facility] today to start water aerobics and wants to get back into logging foods. Tammy [name] has been limiting fruit and notices her blood sugars have improved.  Surgery date: 02/27/2015 Surgery type: RYGB Start weight at [facility]: 336 lbs on 02/11/2014 (highest weight 355 lbs per patient) Weight today: 280.8 lbs Weight change: 11 lbs Total weight lost: 54 lbs (74 lbs)  Goal: lose 100 pounds!  Preferred Learning Style:   No preference indicated   Learning Readiness:   Ready   24-hr recall: B (AM): 1/2 banana and peanut butter OR protein shake  Snk (10 AM): rest of banana and handful of Special K cereal L (PM): [place] and flatbread rollup and almonds  Snk (PM): 100-calorie peach cup D (PM): fish, chicken, or [place] with beans or cabbage Snk (PM): nuts or peanut butter  Fluid intake: 70 oz water, sometimes decaf coffee, 11 oz of protein shake Estimated total protein intake: 60+ g/day  Medications: taking 1/2 blood pressure pill Supplementation: taking  CBG monitoring: morning and evening Average CBG per patient: 89-110 mg/dL Last patient reported A1c: none recently  Using straws: no Drinking while eating: sometimes forgets Hair loss: yes, taking Hair Skin and Nails Carbonated beverages: none N/V/D/C: constipation resolved with Milk of Magnesia Dumping syndrome: none  Recent physical activity:  none  Progress Towards Goal(s):  In progress.  Handouts given during visit include:    Nutritional Diagnosis:  [place]-3.3  Overweight/obesity related to past poor dietary habits and physical inactivity as evidenced by patient w/ recent RYGB surgery following dietary guidelines for continued weight loss.     Intervention:  Nutrition counseling provided.  Teaching Method Utilized:  Visual Auditory Hands on  Barriers to learning/adherence to lifestyle change: none  Demonstrated degree of understanding via:  Teach Back   Monitoring/Evaluation:  Dietary intake, exercise, and body weight. Follow up in 3 months for 11 month post-op visit.

## 2015-10-25 NOTE — Patient Instructions (Addendum)
Goals:  Follow Phase 3B: High Protein + Non-Starchy Vegetables  Eat 3-6 small meals/snacks, every 3-5 hrs  Increase lean protein foods to meet 60g goal  Increase fluid intake to 64oz +  Avoid drinking 15 minutes before, during and 30 minutes after eating  Get into water aerobics  Try Citracal Petites (Calcium in tablet form)  Aim for 1 serving of fruit per day or less  Avoid bread and crackers and granola (limit cereal and fruit)  Get back to logging foods  Keep some easy, high protein snacks on hand  Boiled eggs, egg/chicken salad, deli meat, [place] sausage, [employer] fritattas, [employer] protein chips   Surgery date: 02/27/2015 Surgery type: RYGB Start weight: 355 lbs Weight today: 280.8 lbs Weight change: 11 lbs Total weight lost: 74 lbs

## 2015-10-27 ENCOUNTER — Other Ambulatory Visit: Payer: Self-pay | Admitting: Family Medicine

## 2015-12-24 ENCOUNTER — Telehealth: Payer: Self-pay | Admitting: General Practice

## 2015-12-24 ENCOUNTER — Encounter: Payer: Self-pay | Admitting: Family Medicine

## 2015-12-24 NOTE — Telephone Encounter (Signed)
Please let pt know that we are accepting new pts and are now located in [place] (she lives in [place])

## 2015-12-24 NOTE — Telephone Encounter (Signed)
mychart message sent to pt to inform.

## 2015-12-24 NOTE — Telephone Encounter (Signed)
Per pt mychart message would like to transfer care to Dr. Birdie Riddle. Please advise?   "ARE YOU ACCEPTING NEW PATIENCE, I'M NOT REALLY NEW YOU'VE SEEN ME SEVERAL TIMES BEFORE. WHERE ARE YOU LOCATED NOW?"

## 2016-01-11 ENCOUNTER — Ambulatory Visit (INDEPENDENT_AMBULATORY_CARE_PROVIDER_SITE_OTHER): Payer: 59

## 2016-01-11 ENCOUNTER — Ambulatory Visit (INDEPENDENT_AMBULATORY_CARE_PROVIDER_SITE_OTHER): Payer: 59 | Admitting: Podiatry

## 2016-01-11 VITALS — BP 139/76

## 2016-01-11 DIAGNOSIS — M79671 Pain in right foot: Secondary | ICD-10-CM

## 2016-01-11 DIAGNOSIS — M779 Enthesopathy, unspecified: Secondary | ICD-10-CM | POA: Diagnosis not present

## 2016-01-11 MED ORDER — TRIAMCINOLONE ACETONIDE 10 MG/ML IJ SUSP
10.0000 mg | Freq: Once | INTRAMUSCULAR | Status: AC
  Administered 2016-01-11: 10 mg

## 2016-01-13 NOTE — Progress Notes (Signed)
Subjective:     Patient ID: Tammy [name], female   [name]: [birth year], 55 y.o.   MRN: [id number]  HPI patient states my right foot is still sore in the ankle. It did improve but is back and I'm still trying to lose weight   Review of Systems     Objective:   Physical Exam Neurovascular status intact with continued discomfort around the posterior tibial tendon as it comes under the medial malleolus with extreme pressure secondary to flatfoot and lower leg lymphedema bilateral    Assessment:     Tendinitis secondary to structural changes and pathology    Plan:     Careful sheath injection administered 3 mg Kenalog 5 mg Xylocaine advised on support and reappoint to recheck

## 2016-01-22 ENCOUNTER — Ambulatory Visit: Payer: 59 | Admitting: Dietician

## 2016-02-18 ENCOUNTER — Telehealth: Payer: Self-pay | Admitting: Family Medicine

## 2016-02-18 DIAGNOSIS — E43 Unspecified severe protein-calorie malnutrition: Secondary | ICD-10-CM

## 2016-02-18 MED ORDER — FUROSEMIDE 40 MG PO TABS: 40.0000 mg | ORAL_TABLET | Freq: Every day | ORAL | 0 refills | Status: DC

## 2016-02-18 NOTE — Telephone Encounter (Signed)
Message left to return my call.    KP

## 2016-02-18 NOTE — Telephone Encounter (Signed)
Relation to [id number] Call back [place] - [address] AT [place]  Reason for call:   Patient would like to discuss furosemide (LASIX) 40 MG tablet unsure if she should continue to take medication. Please advise

## 2016-02-18 NOTE — Telephone Encounter (Signed)
Patient needed a refill on her Lasix. #30 faxed to [facility].    KP

## 2016-02-19 ENCOUNTER — Encounter: Payer: 59 | Attending: Surgery | Admitting: Dietician

## 2016-02-19 ENCOUNTER — Encounter: Payer: Self-pay | Admitting: Dietician

## 2016-02-19 DIAGNOSIS — Z9884 Bariatric surgery status: Secondary | ICD-10-CM | POA: Insufficient documentation

## 2016-02-19 DIAGNOSIS — Z713 Dietary counseling and surveillance: Secondary | ICD-10-CM | POA: Insufficient documentation

## 2016-02-19 NOTE — Progress Notes (Signed)
  Follow-up visit:  12 months Post-Operative RYGB Surgery  Medical Nutrition Therapy:  Appt start time: 205 end time: 220  Primary concerns today: Post-operative Bariatric Surgery Nutrition Management.  [place] returns today having gained 15 pounds. However, she states she is doing very well. She states she will continue to need injections in her feet. Having some stress at work and notices stress eating. She reports that she is remaining in good spirits. She and her husband have committed to increasing exercise.  Surgery date: 02/27/2015 Surgery type: RYGB Start weight at [facility]: 336 lbs on 02/11/2014 (highest weight 355 lbs per patient) Weight today: 295.6 Weight change: 15 lbs gain  Total weight lost: 60 lbs  Preferred Learning Style:   No preference indicated   Learning Readiness:   Ready  24-hr recall: B (AM): 1/2 banana and peanut butter OR protein shake  Snk (10 AM): rest of banana and handful of Special K cereal L (PM): [place] and flatbread rollup and almonds  Snk (PM): 100-calorie peach cup D (PM): fish, chicken, or [place] with beans or cabbage Snk (PM): nuts or peanut butter  Fluid intake: 70 oz water, sometimes decaf coffee, 11 oz of protein shake Estimated total protein intake: 60+ g/day  Medications: taking 1/2 blood pressure pill Supplementation: taking  CBG monitoring: morning and evening Average CBG per patient: 89-110 mg/dL Last patient reported A1c: none recently  Using straws: no Drinking while eating: sometimes forgets Hair loss: yes, taking Hair Skin and Nails Carbonated beverages: none N/V/D/C: constipation resolved with Milk of Magnesia Dumping syndrome: none  Recent physical activity:  none  Progress Towards Goal(s):  In progress.  Handouts given during visit include:    Nutritional Diagnosis:  [place]-3.3 Overweight/obesity related to past poor dietary habits and physical inactivity as evidenced by patient w/ recent RYGB surgery following  dietary guidelines for continued weight loss.     Intervention:  Nutrition counseling provided.  Teaching Method Utilized:  Visual Auditory Hands on  Barriers to learning/adherence to lifestyle change: none  Demonstrated degree of understanding via:  Teach Back   Monitoring/Evaluation:  Dietary intake, exercise, and body weight. Follow up in 4 months for 16 month post-op visit.

## 2016-02-19 NOTE — Patient Instructions (Addendum)
Goals:  -Continue to avoid bread and other starches  -Stay away from places where you are tempted  -Try cauliflower breadsticks or pizza crust  -Avoid trigger foods  -Goal: exercise at least 3x a week  -Bike, arm and leg strength training  -Deck of cards workout  *Keep your positive attitude!

## 2016-02-27 ENCOUNTER — Ambulatory Visit: Payer: 59 | Admitting: Family Medicine

## 2016-03-18 ENCOUNTER — Ambulatory Visit: Payer: 59 | Admitting: Family Medicine

## 2016-03-24 ENCOUNTER — Other Ambulatory Visit: Payer: Self-pay | Admitting: Family Medicine

## 2016-03-24 DIAGNOSIS — Z1231 Encounter for screening mammogram for malignant neoplasm of breast: Secondary | ICD-10-CM

## 2016-03-31 ENCOUNTER — Ambulatory Visit (INDEPENDENT_AMBULATORY_CARE_PROVIDER_SITE_OTHER): Payer: 59 | Admitting: Family Medicine

## 2016-03-31 ENCOUNTER — Ambulatory Visit (HOSPITAL_BASED_OUTPATIENT_CLINIC_OR_DEPARTMENT_OTHER)
Admission: RE | Admit: 2016-03-31 | Discharge: 2016-03-31 | Disposition: A | Discharge status: Home / Self Care | Payer: 59 | Source: Ambulatory Visit | Attending: Family Medicine | Admitting: Family Medicine

## 2016-03-31 ENCOUNTER — Encounter: Payer: Self-pay | Admitting: Family Medicine

## 2016-03-31 VITALS — BP 128/74 | HR 73 | Temp 98.3°F | Resp 16 | Ht 66.0 in | Wt 299.2 lb

## 2016-03-31 DIAGNOSIS — Z23 Encounter for immunization: Secondary | ICD-10-CM

## 2016-03-31 DIAGNOSIS — M5441 Lumbago with sciatica, right side: Secondary | ICD-10-CM

## 2016-03-31 DIAGNOSIS — I10 Essential (primary) hypertension: Secondary | ICD-10-CM

## 2016-03-31 DIAGNOSIS — R600 Localized edema: Secondary | ICD-10-CM | POA: Diagnosis not present

## 2016-03-31 DIAGNOSIS — E43 Unspecified severe protein-calorie malnutrition: Secondary | ICD-10-CM

## 2016-03-31 DIAGNOSIS — E1151 Type 2 diabetes mellitus with diabetic peripheral angiopathy without gangrene: Secondary | ICD-10-CM

## 2016-03-31 MED ORDER — AMLODIPINE BESYLATE 5 MG PO TABS: 5.0000 mg | ORAL_TABLET | Freq: Every day | ORAL | 3 refills | Status: DC

## 2016-03-31 MED ORDER — CYCLOBENZAPRINE HCL 10 MG PO TABS: 10.0000 mg | ORAL_TABLET | Freq: Three times a day (TID) | ORAL | 0 refills | Status: DC | PRN

## 2016-03-31 MED ORDER — NONFORMULARY OR COMPOUNDED ITEM: 1 refills | Status: DC

## 2016-03-31 MED ORDER — FUROSEMIDE 40 MG PO TABS: 40.0000 mg | ORAL_TABLET | Freq: Every day | ORAL | 3 refills | Status: DC

## 2016-03-31 NOTE — Progress Notes (Signed)
Pre visit review using our clinic review tool, if applicable. No additional management support is needed unless otherwise documented below in the visit note. 

## 2016-03-31 NOTE — Progress Notes (Signed)
Patient ID: Tammy [name], female    [name]: 12-25-1960  Age: 55 y.o. MRN: [id number]    Subjective:  Subjective  HPI Tammy [name] presents for f/u dm, chol dmII.  Her back started bothering her over T day weekend.  No known injury.    Review of Systems  Constitutional: Negative for appetite change, diaphoresis, fatigue and unexpected weight change.  Eyes: Negative for pain, redness and visual disturbance.  Respiratory: Negative for cough, chest tightness, shortness of breath and wheezing.   Cardiovascular: Negative for chest pain, palpitations and leg swelling.  Endocrine: Negative for cold intolerance, heat intolerance, polydipsia, polyphagia and polyuria.  Genitourinary: Negative for difficulty urinating, dysuria and frequency.  Neurological: Negative for dizziness, light-headedness, numbness and headaches.    History Past Medical History:  Diagnosis Date  . Acute on chronic appendicitis s/p lap appy 06/10/2012 06/10/2012   surgery done 06-10-12  . Diabetes mellitus    TYPE 2- no meds in several months-was taken off meds -monitoring blood sugar.  Marland Kitchen GERD (gastroesophageal reflux disease)   . Hyperlipidemia   . Hypertension   . Morbid obesity - BMI > 50 12/13/2012  . Pneumonia    none recent  . Poor venous access    "usually difficult stick"  . Sinusitis     She has a past surgical history that includes Abdominal hysterectomy; Knee surgery (Right); Upper gastrointestinal endoscopy (04/14/2006); Colonoscopy (04/11/11); laparoscopic appendectomy (06/10/2012); Appendectomy; and Gastric Roux-En-Y (N/A, 02/27/2015).   Her family history includes Cancer (age of onset: 12) in her mother; Diabetes in her father and paternal grandmother; Hyperlipidemia in her brother and mother; Hypertension in her mother.She reports that she has never smoked. She has never used smokeless tobacco. She reports that she does not drink alcohol or use drugs.  Current Outpatient Prescriptions on File Prior to  Visit  Medication Sig Dispense Refill  . albuterol (PROVENTIL HFA;VENTOLIN HFA) 108 (90 Base) MCG/ACT inhaler Inhale 2 puffs into the lungs every 6 (six) hours as needed for wheezing or shortness of breath. 1 Inhaler 2  . Elastic Bandages & Supports (FUTURO SHEER SUPPORT HOSE) MISC 1 packet by Other route daily. 1 pair of support hose to wear daily 1 each 0  . fluticasone (FLONASE) 50 MCG/ACT nasal spray Place 2 sprays into both nostrils daily as needed for allergies.    Marland Kitchen glucose blood test strip Check blood sugar twice daily 100 each 11  . loratadine (CLARITIN) 10 MG tablet Take 1 tablet (10 mg total) by mouth daily as needed for allergies.    . NONFORMULARY OR COMPOUNDED ITEM Compression stockings 20-30 mm hg #1 dx-edema 1 each 0  . pantoprazole (PROTONIX) 40 MG tablet Take 2 tablets by mouth  daily 180 tablet 3  . potassium chloride (KLOR-CON) 20 MEQ packet Take 20 mEq by mouth daily. 30 packet 5   No current facility-administered medications on file prior to visit.      Objective:  Objective  Physical Exam  Constitutional: She is oriented to person, place, and time. She appears well-developed and well-nourished.  HENT:  Head: Normocephalic and atraumatic.  Eyes: Conjunctivae and EOM are normal.  Neck: Normal range of motion. Neck supple. No JVD present. Carotid bruit is not present. No thyromegaly present.  Cardiovascular: Normal rate, regular rhythm and normal heart sounds.   No murmur heard. Pulmonary/Chest: Effort normal and breath sounds normal. No respiratory distress. She has no wheezes. She has no rales. She exhibits no tenderness.  Musculoskeletal: Normal range of  motion. She exhibits no edema or tenderness.  Neurological: She is alert and oriented to person, place, and time. She has normal reflexes. She displays normal reflexes. She exhibits normal muscle tone. Coordination normal.  Psychiatric: She has a normal mood and affect. Her behavior is normal. Judgment and thought  content normal.  Nursing note and vitals reviewed.  BP 128/74 (BP Location: Left Arm, Patient Position: Sitting, Cuff Size: Large)   Pulse 73   Temp 98.3 F (36.8 C) (Oral)   Resp 16   Ht 5\' 6"  (1.676 m)   Wt 299 lb 3.2 oz (135.7 kg)   SpO2 99%   BMI 48.29 kg/m  Wt Readings from Last 3 Encounters:  03/31/16 299 lb 3.2 oz (135.7 kg)  02/19/16 295 lb 9.6 oz (134.1 kg)  10/25/15 280 lb 12.8 oz (127.4 kg)     Lab Results  Component Value Date   WBC 2.6 (L) 08/18/2015   HGB 12.3 08/18/2015   HCT 36.3 08/18/2015   PLT 205 08/18/2015   GLUCOSE 90 08/18/2015   CHOL 164 03/21/2015   TRIG 60.0 03/21/2015   HDL 48.40 03/21/2015   LDLDIRECT 129.1 10/17/2009   LDLCALC 103 (H) 03/21/2015   ALT 26 03/21/2015   AST 27 03/21/2015   NA 144 08/18/2015   K 3.6 08/18/2015   CL 109 08/18/2015   CREATININE 0.79 08/18/2015   BUN 10 08/18/2015   CO2 25 08/18/2015   TSH 3.181 02/21/2014   INR 1.1 (H) 10/16/2014   HGBA1C 6.2 03/21/2015   MICROALBUR 0.58 10/22/2012    No results found.   Assessment & Plan:  Plan  I have discontinued Tammy [name]'s levofloxacin, doxycycline, and amoxicillin-clavulanate. I am also having her start on NONFORMULARY OR COMPOUNDED ITEM and cyclobenzaprine. Additionally, I am having her maintain her loratadine, fluticasone, FUTURO SHEER SUPPORT HOSE, glucose blood, potassium chloride, albuterol, NONFORMULARY OR COMPOUNDED ITEM, pantoprazole, amLODipine, and furosemide.  Meds ordered this encounter  Medications  . amLODipine (NORVASC) 5 MG tablet    Sig: Take 1 tablet (5 mg total) by mouth daily.    Dispense:  90 tablet    Refill:  3  . furosemide (LASIX) 40 MG tablet    Sig: Take 1 tablet (40 mg total) by mouth daily.    Dispense:  90 tablet    Refill:  3  . NONFORMULARY OR COMPOUNDED ITEM    Sig: Compression socks-- dx edema   20-30 mm/ hg    Dispense:  1 each    Refill:  1  . cyclobenzaprine (FLEXERIL) 10 MG tablet    Sig: Take 1 tablet (10 mg  total) by mouth 3 (three) times daily as needed for muscle spasms.    Dispense:  30 tablet    Refill:  0    Problem List Items Addressed This Visit      Unprioritized   Essential hypertension   Relevant Medications   amLODipine (NORVASC) 5 MG tablet   furosemide (LASIX) 40 MG tablet   Other Relevant Orders   Hemoglobin A1c   Lipid panel   Comprehensive metabolic panel   POCT urinalysis dipstick   Microalbumin / creatinine urine ratio   ANKLE EDEMA, CHRONIC   Relevant Medications   furosemide (LASIX) 40 MG tablet   Lower extremity edema   Relevant Medications   NONFORMULARY OR COMPOUNDED ITEM    Other Visit Diagnoses    Low back pain with right-sided sciatica, unspecified back pain laterality, unspecified chronicity    -  Primary   Relevant Medications   cyclobenzaprine (FLEXERIL) 10 MG tablet   Other Relevant Orders   DG Lumbar Spine Complete (Completed)   DM (diabetes mellitus) type II controlled peripheral vascular disorder (HCC)       Relevant Medications   amLODipine (NORVASC) 5 MG tablet   furosemide (LASIX) 40 MG tablet   Other Relevant Orders   Hemoglobin A1c   Comprehensive metabolic panel   POCT urinalysis dipstick   Microalbumin / creatinine urine ratio   Encounter for immunization       Relevant Orders   Flu Vaccine QUAD 36+ mos IM (Completed)      Follow-up: Return in about 6 months (around 09/28/2016), or if symptoms worsen or fail to improve, for hypertension, hyperlipidemia, diabetes II, annual exam, fasting.  [name], DO

## 2016-03-31 NOTE — Patient Instructions (Signed)
Back Pain, Adult Introduction Back pain is very common. The pain often gets better over time. The cause of back pain is usually not dangerous. Most people can learn to manage their back pain on their own. Follow these instructions at home: Watch your back pain for any changes. The following actions may help to lessen any pain you are feeling:  Stay active. Start with short walks on flat ground if you can. Try to walk farther each day.  Exercise regularly as told by your doctor. Exercise helps your back heal faster. It also helps avoid future injury by keeping your muscles strong and flexible.  Do not sit, drive, or stand in one place for more than 30 minutes.  Do not stay in bed. Resting more than 1-2 days can slow down your recovery.  Be careful when you bend or lift an object. Use good form when lifting:  Bend at your knees.  Keep the object close to your body.  Do not twist.  Sleep on a firm mattress. Lie on your side, and bend your knees. If you lie on your back, put a pillow under your knees.  Take medicines only as told by your doctor.  Put ice on the injured area.  Put ice in a plastic bag.  Place a towel between your skin and the bag.  Leave the ice on for 20 minutes, 2-3 times a day for the first 2-3 days. After that, you can switch between ice and heat packs.  Avoid feeling anxious or stressed. Find good ways to deal with stress, such as exercise.  Maintain a healthy weight. Extra weight puts stress on your back. Contact a doctor if:  You have pain that does not go away with rest or medicine.  You have worsening pain that goes down into your legs or buttocks.  You have pain that does not get better in one week.  You have pain at night.  You lose weight.  You have a fever or chills. Get help right away if:  You cannot control when you poop (bowel movement) or pee (urinate).  Your arms or legs feel weak.  Your arms or legs lose feeling  (numbness).  You feel sick to your stomach (nauseous) or throw up (vomit).  You have belly (abdominal) pain.  You feel like you may pass out (faint). This information is not intended to replace advice given to you by your health care provider. Make sure you discuss any questions you have with your health care provider. Document Released: 10/08/2007 Document Revised: 09/27/2015 Document Reviewed: 08/23/2013  2017 Elsevier  

## 2016-04-04 ENCOUNTER — Other Ambulatory Visit: Payer: 59

## 2016-04-15 ENCOUNTER — Encounter: Payer: Self-pay | Admitting: Internal Medicine

## 2016-04-18 ENCOUNTER — Ambulatory Visit
Admission: RE | Admit: 2016-04-18 | Discharge: 2016-04-18 | Disposition: A | Discharge status: Home / Self Care | Payer: 59 | Source: Ambulatory Visit | Attending: Family Medicine | Admitting: Family Medicine

## 2016-04-18 ENCOUNTER — Ambulatory Visit: Payer: 59

## 2016-04-18 ENCOUNTER — Other Ambulatory Visit: Payer: Self-pay | Admitting: Family Medicine

## 2016-04-18 ENCOUNTER — Telehealth: Payer: Self-pay | Admitting: Family Medicine

## 2016-04-18 DIAGNOSIS — G8929 Other chronic pain: Secondary | ICD-10-CM

## 2016-04-18 DIAGNOSIS — Z1231 Encounter for screening mammogram for malignant neoplasm of breast: Secondary | ICD-10-CM

## 2016-04-18 DIAGNOSIS — M25562 Pain in left knee: Principal | ICD-10-CM

## 2016-04-18 NOTE — Telephone Encounter (Signed)
Caller name: Aynara Urueta Relationship to patient: self Can be reached: [phone]  Reason for call: Pt called in stating her back is feeling better but her left knee is still hurting. She said we may refer to [facility]. Requesting call back from nurse.

## 2016-04-18 NOTE — Telephone Encounter (Signed)
Referral put in.

## 2016-04-18 NOTE — Telephone Encounter (Signed)
Patient having left knee pain for several months. She was seen by Dr. Barbaraann Barthel before and wants to go back to him as he saw her for this. Would like something scheduled before the end of the year if possible.

## 2016-04-23 ENCOUNTER — Ambulatory Visit (INDEPENDENT_AMBULATORY_CARE_PROVIDER_SITE_OTHER): Payer: 59 | Admitting: Family Medicine

## 2016-04-23 DIAGNOSIS — M25562 Pain in left knee: Secondary | ICD-10-CM | POA: Diagnosis not present

## 2016-04-23 NOTE — Patient Instructions (Signed)
You have a mild flare of arthritis and potentially a very mild patellar subluxation. Icing (or heat at this point) 15 minutes at a time 3-4 times a day. Knee brace or sleeve for support for next 4-6 weeks. Straight leg raises, straight leg raises with foot turned outwards, and knee extensions 3 sets of 10 once a day. Add ankle weight if these become too easy. If you use ibuprofen or aleve regularly only do so for 7-10 days then as needed. Follow up with me in 6 weeks for reevaluation (or as needed if you're doing well).

## 2016-04-29 LAB — HM DIABETES EYE EXAM

## 2016-05-01 NOTE — Progress Notes (Signed)
PCP: [name], DO  Subjective:   HPI: Patient is a 55 y.o. female here for left knee pain.  Patient seen previously for left knee. Overall has been doing well up until 3 weeks ago. Felt an anterior pop with left foot planted and pivoting. Some swelling - especially notices this if on her feet too long. Pain level 5/10 but up to 8/10 at worst, sharp. No skin changes, numbness.  Past Medical History:  Diagnosis Date  . Acute on chronic appendicitis s/p lap appy 06/10/2012 06/10/2012   surgery done 06-10-12  . Diabetes mellitus    TYPE 2- no meds in several months-was taken off meds -monitoring blood sugar.  Marland Kitchen GERD (gastroesophageal reflux disease)   . Hyperlipidemia   . Hypertension   . Morbid obesity - BMI > 50 12/13/2012  . Pneumonia    none recent  . Poor venous access    "usually difficult stick"  . Sinusitis     Current Outpatient Prescriptions on File Prior to Visit  Medication Sig Dispense Refill  . albuterol (PROVENTIL HFA;VENTOLIN HFA) 108 (90 Base) MCG/ACT inhaler Inhale 2 puffs into the lungs every 6 (six) hours as needed for wheezing or shortness of breath. 1 Inhaler 2  . amLODipine (NORVASC) 5 MG tablet Take 1 tablet (5 mg total) by mouth daily. 90 tablet 3  . cyclobenzaprine (FLEXERIL) 10 MG tablet Take 1 tablet (10 mg total) by mouth 3 (three) times daily as needed for muscle spasms. 30 tablet 0  . Elastic Bandages & Supports (FUTURO SHEER SUPPORT HOSE) MISC 1 packet by Other route daily. 1 pair of support hose to wear daily 1 each 0  . fluticasone (FLONASE) 50 MCG/ACT nasal spray Place 2 sprays into both nostrils daily as needed for allergies.    . furosemide (LASIX) 40 MG tablet Take 1 tablet (40 mg total) by mouth daily. 90 tablet 3  . glucose blood test strip Check blood sugar twice daily 100 each 11  . loratadine (CLARITIN) 10 MG tablet Take 1 tablet (10 mg total) by mouth daily as needed for allergies.    . NONFORMULARY OR COMPOUNDED ITEM Compression  stockings 20-30 mm hg #1 dx-edema 1 each 0  . NONFORMULARY OR COMPOUNDED ITEM Compression socks-- dx edema   20-30 mm/ hg 1 each 1  . pantoprazole (PROTONIX) 40 MG tablet Take 2 tablets by mouth  daily 180 tablet 3  . potassium chloride (KLOR-CON) 20 MEQ packet Take 20 mEq by mouth daily. 30 packet 5   No current facility-administered medications on file prior to visit.     Past Surgical History:  Procedure Laterality Date  . ABDOMINAL HYSTERECTOMY    . APPENDECTOMY     2'14  . COLONOSCOPY  04/11/11   5 mm polyp removed but not recovered  . GASTRIC ROUX-EN-Y N/A 02/27/2015   Procedure: LAPAROSCOPIC ROUX-EN-Y GASTRIC BYPASS WITH UPPER ENDOSCOPY;  Surgeon: Johnathan Hausen, MD;  Location: [facility];  Service: General;  Laterality: N/A;  . KNEE SURGERY Right     KNEE SURGERY   . LAPAROSCOPIC APPENDECTOMY  06/10/2012   Procedure: APPENDECTOMY LAPAROSCOPIC;  Surgeon: Adin Hector, MD;  Location: [facility];  Service: General;  Laterality: N/A;  . UPPER GASTROINTESTINAL ENDOSCOPY  04/14/2006   Normal    Allergies  Allergen Reactions  . Ace Inhibitors Anaphylaxis    Bowel angioedema  . Cheese Nausea And Vomiting  . Red Dye Nausea And Vomiting  . [name] Flash [Fish Allergy] Nausea And Vomiting  Social History   Social History  . Marital status: Married    Spouse name: N/A  . Number of children: 0  . Years of education: N/A   Occupational History  . 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 Smoking status: Never Smoker  . Smokeless tobacco: Never Used  . Alcohol use No  . Drug use: No  . Sexual activity: Yes    Partners: Male   Other Topics Concern  . Not on file   Social History Narrative  . No narrative on file    Family History  Problem Relation Age of Onset  . Hyperlipidemia Mother   . Hypertension Mother   . Cancer Mother 59    hodgkins lymphoma  . Diabetes Father   . Hyperlipidemia Brother   . Diabetes Paternal Grandmother   . Colon cancer  Neg Hx   . Heart attack Neg Hx   . Sudden death Neg Hx     BP 134/84   Pulse 80   Ht 5\' 6"  (1.676 m)   Wt 282 lb (127.9 kg)   BMI 45.52 kg/m   Review of Systems: See HPI above.     Objective:  Physical Exam:  Gen: NAD, comfortable in exam room  Left knee: No gross deformity, ecchymoses, effusion. TTP medial joint line mildly, post patellar facets. FROM. Negative ant/post drawers. Negative valgus/varus testing. Negative lachmanns. Negative mcmurrays, apleys, patellar apprehension. NV intact distally.  Right knee: FROM without pain.   Assessment & Plan:  1. Left knee pain - likely due to flare of arthritis, possible mild subluxation but MPFL intact on exam.  Knee brace/sleeve for support.  Icing/heat.  Ibuprofen or aleve if needed.  Shown home exercises to do daily.  F/u in 6 weeks.

## 2016-05-01 NOTE — Assessment & Plan Note (Signed)
likely due to flare of arthritis, possible mild subluxation but MPFL intact on exam.  Knee brace/sleeve for support.  Icing/heat.  Ibuprofen or aleve if needed.  Shown home exercises to do daily.  F/u in 6 weeks.

## 2016-05-07 ENCOUNTER — Other Ambulatory Visit (INDEPENDENT_AMBULATORY_CARE_PROVIDER_SITE_OTHER): Payer: 59

## 2016-05-07 DIAGNOSIS — I10 Essential (primary) hypertension: Secondary | ICD-10-CM

## 2016-05-07 DIAGNOSIS — E1151 Type 2 diabetes mellitus with diabetic peripheral angiopathy without gangrene: Secondary | ICD-10-CM | POA: Diagnosis not present

## 2016-05-07 DIAGNOSIS — D72829 Elevated white blood cell count, unspecified: Secondary | ICD-10-CM

## 2016-05-07 LAB — POC URINALSYSI DIPSTICK (AUTOMATED)
BILIRUBIN UA: NEGATIVE
Blood, UA: NEGATIVE
GLUCOSE UA: NEGATIVE
Ketones, UA: NEGATIVE
LEUKOCYTES UA: NEGATIVE
NITRITE UA: NEGATIVE
PH UA: 6
Protein, UA: NEGATIVE
Spec Grav, UA: 1.02
Urobilinogen, UA: NEGATIVE

## 2016-05-07 LAB — COMPREHENSIVE METABOLIC PANEL
ALT: 36 U/L — AB (ref 0–35)
AST: 23 U/L (ref 0–37)
Albumin: 3.9 g/dL (ref 3.5–5.2)
Alkaline Phosphatase: 93 U/L (ref 39–117)
BILIRUBIN TOTAL: 0.5 mg/dL (ref 0.2–1.2)
BUN: 14 mg/dL (ref 6–23)
CO2: 31 meq/L (ref 19–32)
Calcium: 9 mg/dL (ref 8.4–10.5)
Chloride: 105 mEq/L (ref 96–112)
Creatinine, Ser: 0.85 mg/dL (ref 0.40–1.20)
GFR: 89.22 mL/min (ref >60.00)
Glucose, Bld: 106 mg/dL — ABNORMAL HIGH (ref 70–99)
Potassium: 3.5 mEq/L (ref 3.5–5.1)
Sodium: 141 mEq/L (ref 135–145)
Total Protein: 6.9 g/dL (ref 6.0–8.3)

## 2016-05-07 LAB — LIPID PANEL
CHOL/HDL RATIO: 2
Cholesterol: 164 mg/dL (ref 0–200)
HDL: 70 mg/dL (ref >39.00)
LDL Cholesterol: 87 mg/dL (ref 0–99)
NonHDL: 94.27
TRIGLYCERIDES: 36 mg/dL (ref 0.0–149.0)
VLDL: 7.2 mg/dL (ref 0.0–40.0)

## 2016-05-07 LAB — CBC WITH DIFFERENTIAL/PLATELET
BASOS ABS: 0 10*3/uL (ref 0.0–0.1)
Basophils Relative: 0.9 % (ref 0.0–3.0)
Eosinophils Absolute: 0.1 10*3/uL (ref 0.0–0.7)
Eosinophils Relative: 2.8 % (ref 0.0–5.0)
HCT: 36.6 % (ref 36.0–46.0)
Hemoglobin: 12.6 g/dL (ref 12.0–15.0)
LYMPHS ABS: 1.3 10*3/uL (ref 0.7–4.0)
Lymphocytes Relative: 40.4 % (ref 12.0–46.0)
MCHC: 34.5 g/dL (ref 30.0–36.0)
MCV: 86.9 fl (ref 78.0–100.0)
MONOS PCT: 8.1 % (ref 3.0–12.0)
Monocytes Absolute: 0.3 10*3/uL (ref 0.1–1.0)
NEUTROS ABS: 1.5 10*3/uL (ref 1.4–7.7)
NEUTROS PCT: 47.8 % (ref 43.0–77.0)
PLATELETS: 206 10*3/uL (ref 150.0–400.0)
RBC: 4.21 Mil/uL (ref 3.87–5.11)
RDW: 14.2 % (ref 11.5–15.5)
WBC: 3.2 10*3/uL — ABNORMAL LOW (ref 4.0–10.5)

## 2016-05-07 LAB — MICROALBUMIN / CREATININE URINE RATIO
Creatinine,U: 181.8 mg/dL
Microalb Creat Ratio: 0.4 mg/g (ref 0.0–30.0)
Microalb, Ur: <0.7 mg/dL (ref 0.0–1.9)

## 2016-05-07 LAB — HEMOGLOBIN A1C: Hgb A1c MFr Bld: 5.7 % (ref 4.6–6.5)

## 2016-05-12 IMAGING — RF DG UGI W/ GASTROGRAFIN
14 of 18 series · 14 of 18 positions shown · IV contrast (omnipaque)
Comparison: CT of the abdomen 01/27/2015

CLINICAL DATA: Status post Roux-en-Y gastric bypass surgery.

EXAM:
WATER SOLUBLE UPPER GI SERIES
TECHNIQUE: Single-column upper GI series was performed using water soluble
contrast.
CONTRAST:  50mL OMNIPAQUE IOHEXOL 300 MG/ML  SOLN

[Series 1: run · 1 of 1 slices shown (1 of 14)]
[im 1/1]
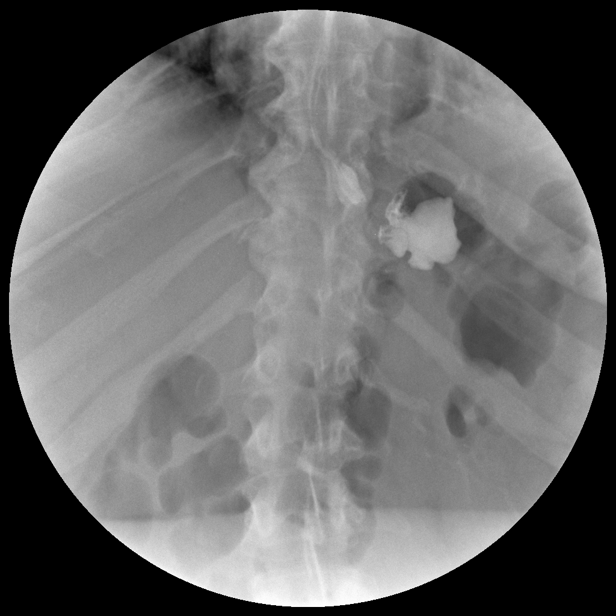

[Series 2: run · 1 of 1 slices shown (2 of 14)]
[im 1/1]
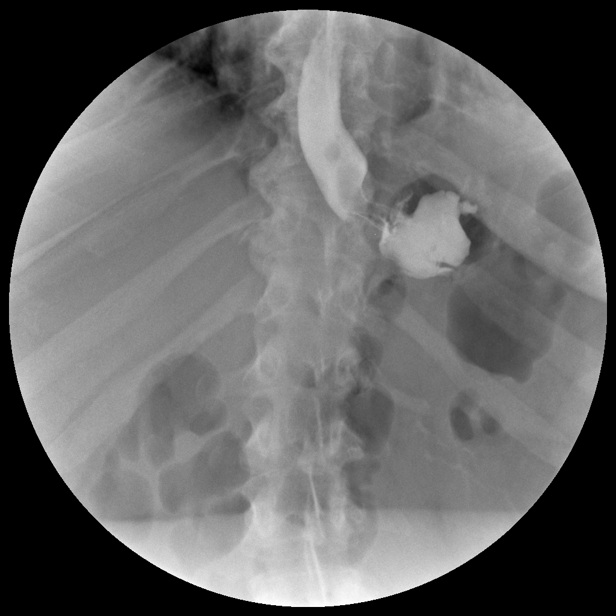

[Series 4: run · 1 of 1 slices shown (3 of 14)]
[im 1/1]
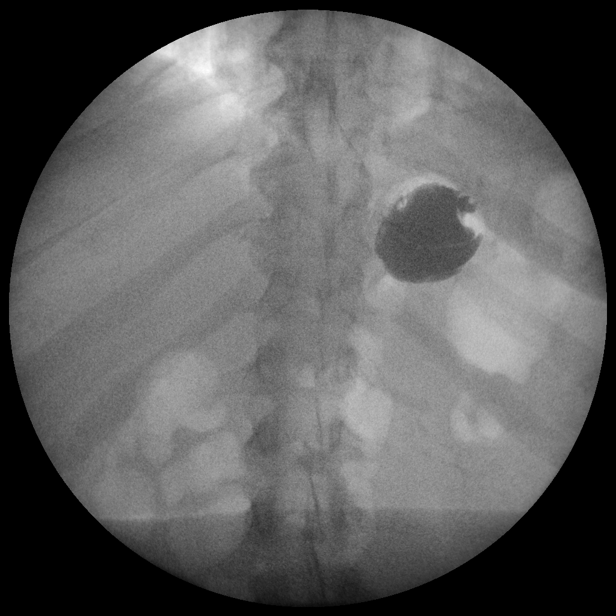

[Series 5: run · 1 of 1 slices shown (4 of 14)]
[im 1/1]
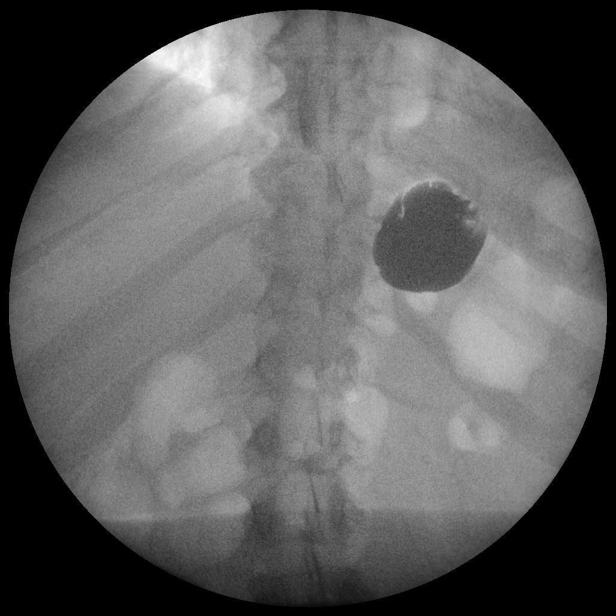

[Series 6: run · 1 of 1 slices shown (5 of 14)]
[im 1/1]
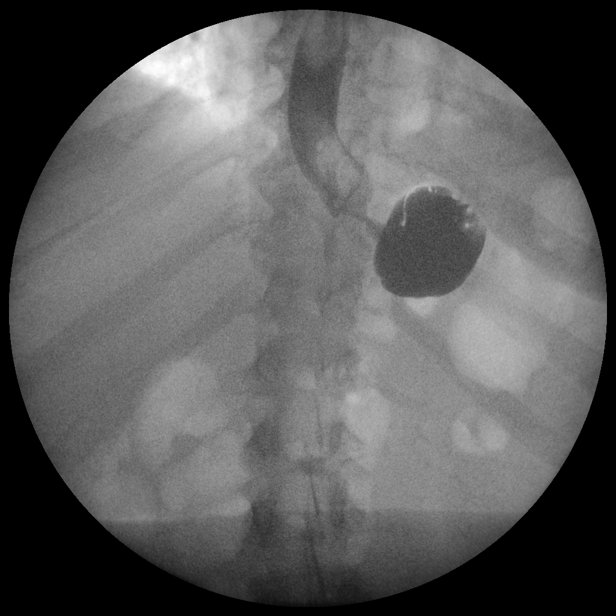

[Series 8: run · 1 of 1 slices shown (6 of 14)]
[im 1/1]
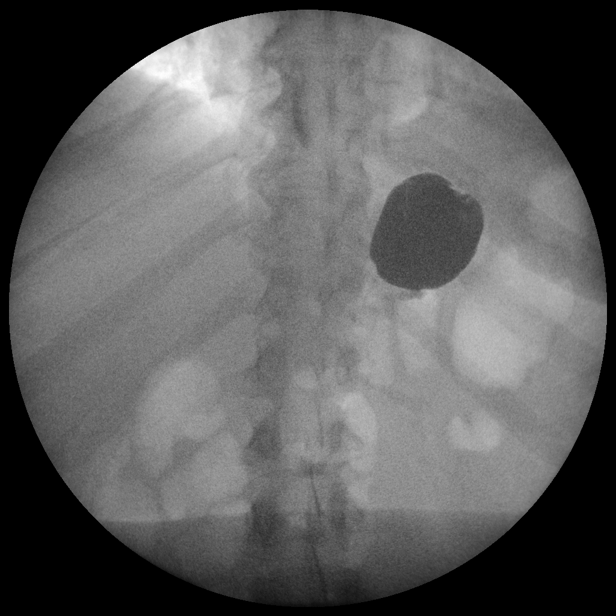

[Series 9: run · 1 of 1 slices shown (7 of 14)]
[im 1/1]
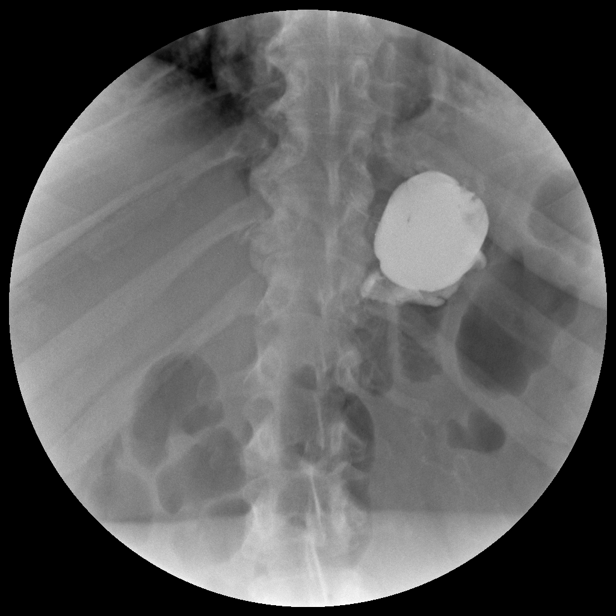

[Series 10: run · 1 of 1 slices shown (8 of 14)]
[im 1/1]
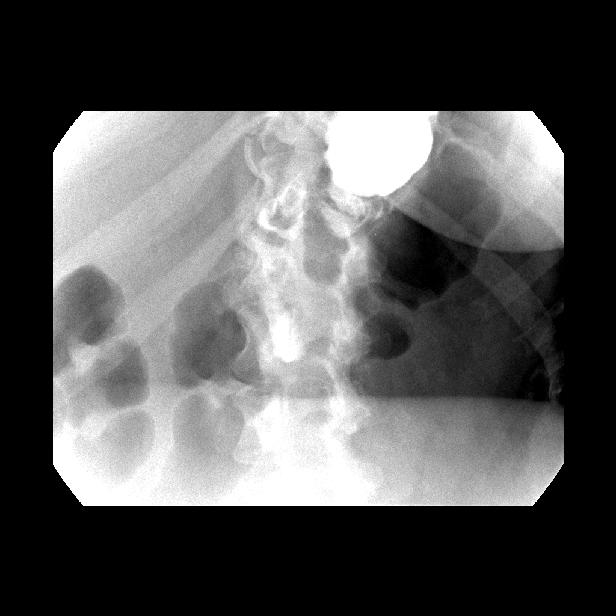

[Series 11: run · 1 of 1 slices shown (9 of 14)]
[im 1/1]
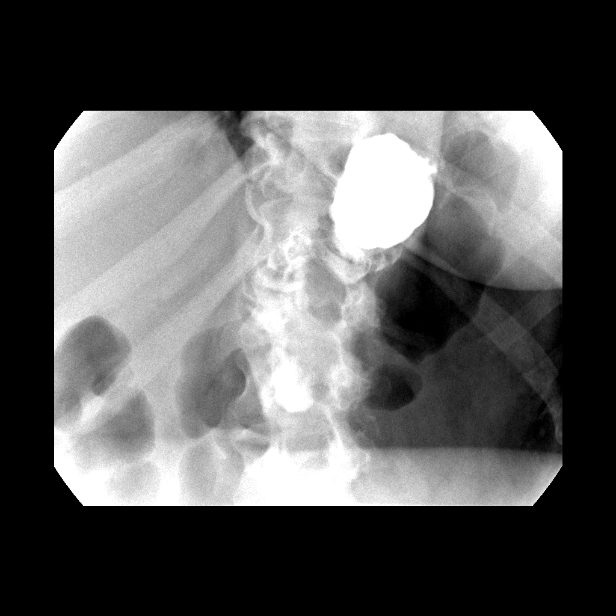

[Series 13: run · 1 of 1 slices shown (10 of 14)]
[im 1/1]
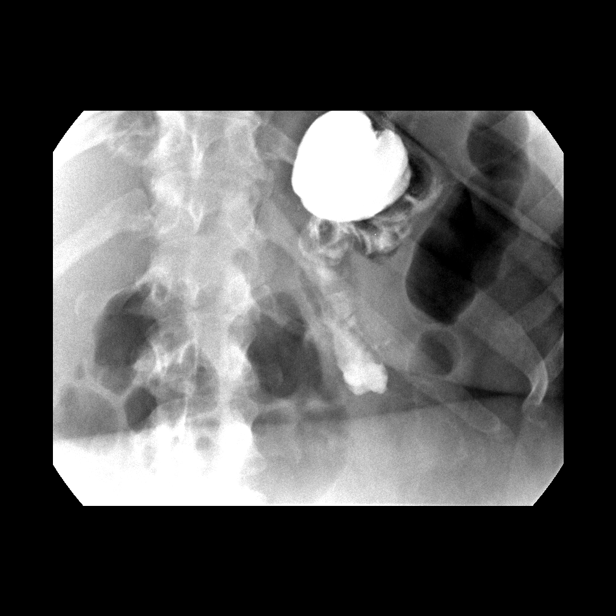

[Series 14: run · 1 of 1 slices shown (11 of 14)]
[im 1/1]
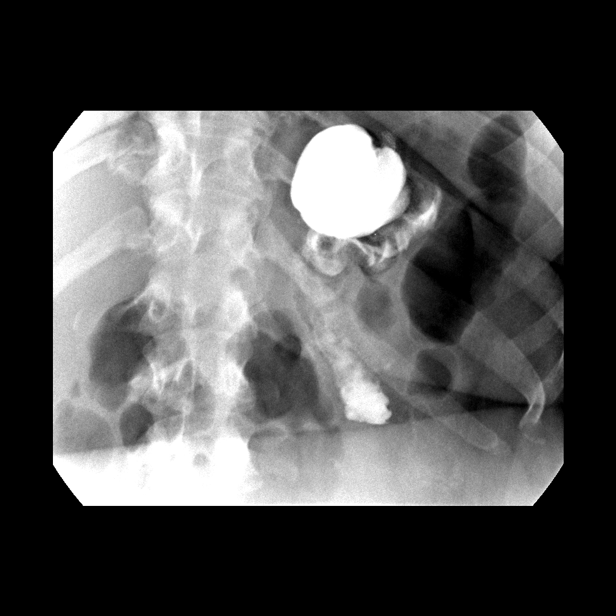

[Series 15: run · 1 of 1 slices shown (12 of 14)]
[im 1/1]
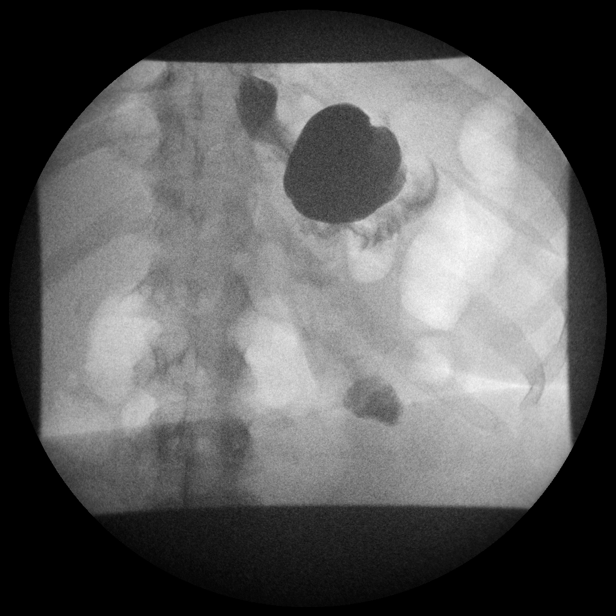

[Series 17: run · 1 of 1 slices shown (13 of 14)]
[im 1/1]
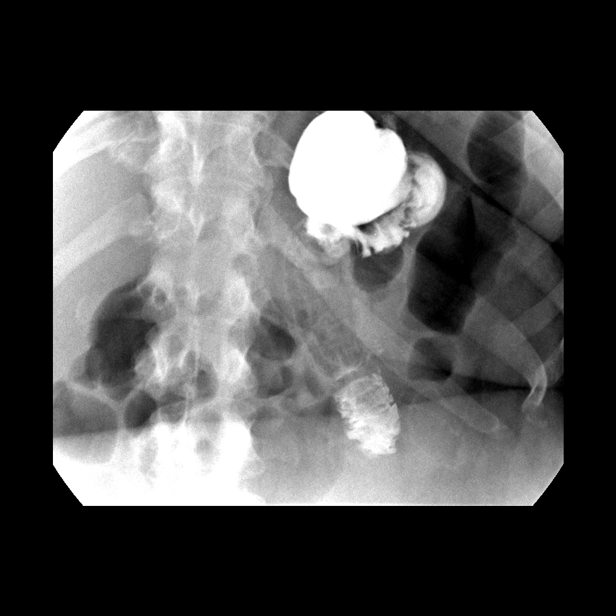

[Series 18: run · 1 of 1 slices shown (14 of 14)]
[im 1/1]
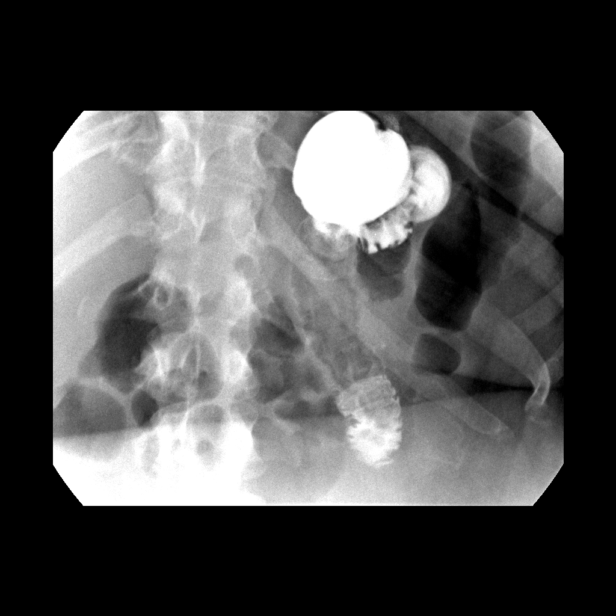

[14 of 18 positions shown; findings below may reference images not displayed]

FLUOROSCOPY TIME:  Fluoroscopy Time (in minutes and seconds): 55
seconds

Number of Acquired Images:  18
FINDINGS: The visualized portion of the lower esophagus was smooth in contour
and demonstrated normal motility. There was no narrowing or
stricture. There was no hiatal hernia. The gastric pouch has a size
of approximately 2 vertebral bodies height. Delayed passage of
contrast is seen from the gastric pouch to the jejunal Roux limb.
The gastrojejunal anastomosis appears intact. There is no evidence
of extraluminal contrast extravasation. There is a slow advancement
of contrast. The jejuno-jejunal anastomosis is not visualized after
15 minutes of the initial contrast administration.
IMPRESSION: Status post Roux-en-Y gastric bypass, with normal appearance of the
gastrojejunal anastomosis, and no evidence of extraluminal contrast
extravasation.

Extremely slow advancement of contrast through the jejunal Roux
limb, with non visualization of the jejuno-jejunal anastomosis.

These results were called by telephone at the time of interpretation
on 02/28/2015 at [DATE] to Dr. FERIENHAUS ERXLEBEN , who verbally
acknowledged these results.

## 2016-06-24 ENCOUNTER — Ambulatory Visit: Payer: 59 | Admitting: Skilled Nursing Facility1

## 2016-07-30 ENCOUNTER — Emergency Department (HOSPITAL_BASED_OUTPATIENT_CLINIC_OR_DEPARTMENT_OTHER)
Admission: EM | Admit: 2016-07-30 | Discharge: 2016-07-30 | Disposition: A | Discharge status: Home / Self Care | Payer: 59 | Attending: Emergency Medicine | Admitting: Emergency Medicine

## 2016-07-30 ENCOUNTER — Encounter (HOSPITAL_BASED_OUTPATIENT_CLINIC_OR_DEPARTMENT_OTHER): Payer: Self-pay | Admitting: Emergency Medicine

## 2016-07-30 DIAGNOSIS — I10 Essential (primary) hypertension: Secondary | ICD-10-CM | POA: Insufficient documentation

## 2016-07-30 DIAGNOSIS — E119 Type 2 diabetes mellitus without complications: Secondary | ICD-10-CM | POA: Diagnosis not present

## 2016-07-30 DIAGNOSIS — R197 Diarrhea, unspecified: Secondary | ICD-10-CM | POA: Diagnosis present

## 2016-07-30 DIAGNOSIS — R112 Nausea with vomiting, unspecified: Secondary | ICD-10-CM | POA: Diagnosis not present

## 2016-07-30 DIAGNOSIS — R531 Weakness: Secondary | ICD-10-CM | POA: Insufficient documentation

## 2016-07-30 LAB — BASIC METABOLIC PANEL
Anion gap: 8 (ref 5–15)
BUN: 16 mg/dL (ref 6–20)
CALCIUM: 8.6 mg/dL — AB (ref 8.9–10.3)
CO2: 24 mmol/L (ref 22–32)
CREATININE: 0.9 mg/dL (ref 0.44–1.00)
Chloride: 108 mmol/L (ref 101–111)
GFR calc Af Amer: >60 mL/min (ref >60)
GFR calc non Af Amer: >60 mL/min (ref >60)
GLUCOSE: 126 mg/dL — AB (ref 65–99)
Potassium: 3.7 mmol/L (ref 3.5–5.1)
Sodium: 140 mmol/L (ref 135–145)

## 2016-07-30 LAB — CBC WITH DIFFERENTIAL/PLATELET
Basophils Absolute: 0 10*3/uL (ref 0.0–0.1)
Basophils Relative: 0 %
Eosinophils Absolute: 0 10*3/uL (ref 0.0–0.7)
Eosinophils Relative: 0 %
HCT: 39.5 % (ref 36.0–46.0)
Hemoglobin: 13.2 g/dL (ref 12.0–15.0)
LYMPHS PCT: 16 %
Lymphs Abs: 0.7 10*3/uL (ref 0.7–4.0)
MCH: 29.7 pg (ref 26.0–34.0)
MCHC: 33.4 g/dL (ref 30.0–36.0)
MCV: 89 fL (ref 78.0–100.0)
MONO ABS: 0.1 10*3/uL (ref 0.1–1.0)
MONOS PCT: 2 %
NEUTROS ABS: 3.7 10*3/uL (ref 1.7–7.7)
Neutrophils Relative %: 82 %
Platelets: 181 10*3/uL (ref 150–400)
RBC: 4.44 MIL/uL (ref 3.87–5.11)
RDW: 13.2 % (ref 11.5–15.5)
WBC: 4.5 10*3/uL (ref 4.0–10.5)

## 2016-07-30 LAB — CBG MONITORING, ED: Glucose-Capillary: 116 mg/dL — ABNORMAL HIGH (ref 65–99)

## 2016-07-30 MED ORDER — METOCLOPRAMIDE HCL 10 MG PO TABS: 10.0000 mg | ORAL_TABLET | Freq: Four times a day (QID) | ORAL | 0 refills | Status: DC | PRN

## 2016-07-30 MED ORDER — SODIUM CHLORIDE 0.9 % IV BOLUS (SEPSIS)
1000.0000 mL | Freq: Once | INTRAVENOUS | Status: AC
  Administered 2016-07-30: 1000 mL via INTRAVENOUS

## 2016-07-30 MED ORDER — ACETAMINOPHEN 325 MG PO TABS
650.0000 mg | ORAL_TABLET | Freq: Once | ORAL | Status: AC
  Administered 2016-07-30: 650 mg via ORAL
  Filled 2016-07-30: qty 2

## 2016-07-30 NOTE — ED Provider Notes (Signed)
[facility] Provider Note   CSN: [id number] Arrival date & time: 07/30/16  1311     History   Chief Complaint Chief Complaint  Patient presents with  . Emesis  . Hypotension    HPI Tammy [name] is a 56 y.o. female.  HPI Patient felt generally weak this morning upon awakening she checked her blood sugar which was 66. She checked her blood pressure this morning which was noted to be 197/97. She took her blood pressure medicine slightly early. She drank orange Juice which brought her blood sugar up to 119 she subsequently had 3 episodes of vomiting and 3 episodes of diarrhea she presently feels well except for a mild diffuse headache across her face which feels like a sinus headache. She is feeling improved spontaneously without treatment. Nothing makes symptoms better or worse. She denies abdominal pain denies chest pain denies fever. Denies other associated symptoms.No abdominal pain no chest pain no fever no urinary symptoms Past Medical History:  Diagnosis Date  . Acute on chronic appendicitis s/p lap appy 06/10/2012 06/10/2012   surgery done 06-10-12  . Diabetes mellitus    TYPE 2- no meds in several months-was taken off meds -monitoring blood sugar.  Marland Kitchen GERD (gastroesophageal reflux disease)   . Hyperlipidemia   . Hypertension   . Morbid obesity - BMI > 50 12/13/2012  . Pneumonia    none recent  . Poor venous access    "usually difficult stick"  . Sinusitis     Patient Active Problem List   Diagnosis Date Noted  . Lower extremity edema 07/04/2015  . Hoarseness 06/15/2015  . Bronchospasm with bronchitis, acute 05/24/2015  . Elevated CA-125 03/16/2015  . 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 02/27/2015  . Emesis, persistent 01/26/2015  . Enteritis 01/25/2015  . Ascites 01/25/2015  . Abdominal pain 01/22/2015  . Ovarian cyst, left 01/03/2015  . Allergic rhinitis 12/21/2013  . Right ankle pain 11/10/2013  . Morbid obesity - BMI > 50 12/13/2012  . Chronic  vomiting 12/13/2012  . Lactose intolerance 10/22/2012  . Left knee pain 07/26/2012  . Bacterial vaginosis 06/10/2012  . Bradycardia, sinus 03/28/2012  . Central positional vertigo 03/27/2012  . Tongue swelling 08/21/2011  . Urgency of urination 05/14/2011  . Personal history of colonic polyps 04/11/2011  . Otalgia 11/14/2010  . OTHER TENOSYNOVITIS OF HAND AND WRIST 07/19/2010  . DYSPEPSIA 10/17/2009  . BACK PAIN, LUMBAR, WITH RADICULOPATHY 12/27/2008  . VITAMIN B12 DEFICIENCY 09/29/2008  . SYNCOPE 08/21/2008  . ANKLE EDEMA, CHRONIC 12/16/2006  . Type 2 diabetes mellitus ([place]) 11/09/2006  . Hyperlipidemia 11/09/2006  . Essential hypertension 05/26/2006  . GERD 05/26/2006    Past Surgical History:  Procedure Laterality Date  . ABDOMINAL HYSTERECTOMY    . APPENDECTOMY     2'14  . COLONOSCOPY  04/11/11   5 mm polyp removed but not recovered  . GASTRIC ROUX-EN-Y N/A 02/27/2015   Procedure: LAPAROSCOPIC ROUX-EN-Y GASTRIC BYPASS WITH UPPER ENDOSCOPY;  Surgeon: Johnathan Hausen, MD;  Location: [facility];  Service: General;  Laterality: N/A;  . KNEE SURGERY Right     KNEE SURGERY   . LAPAROSCOPIC APPENDECTOMY  06/10/2012   Procedure: APPENDECTOMY LAPAROSCOPIC;  Surgeon: Adin Hector, MD;  Location: [facility];  Service: General;  Laterality: N/A;  . UPPER GASTROINTESTINAL ENDOSCOPY  04/14/2006   Normal    OB History    No data available       Home Medications    Prior to  Admission medications   Medication Sig Start Date End Date Taking? Authorizing Provider  albuterol (PROVENTIL HFA;VENTOLIN HFA) 108 (90 Base) MCG/ACT inhaler Inhale 2 puffs into the lungs every 6 (six) hours as needed for wheezing or shortness of breath. 05/24/15   Midge Minium, MD  amLODipine (NORVASC) 5 MG tablet Take 1 tablet (5 mg total) by mouth daily. 03/31/16   Rosalita Chessman Chase, DO  cyclobenzaprine (FLEXERIL) 10 MG tablet Take 1 tablet (10 mg total) by mouth 3 (three) times daily as needed for muscle  spasms. 03/31/16   [name], DO  Elastic Bandages & Supports (FUTURO SHEER SUPPORT HOSE) MISC 1 packet by Other route daily. 1 pair of support hose to wear daily 02/02/15   Rosalita Chessman Chase, DO  fluticasone [facility]) 50 MCG/ACT nasal spray Place 2 sprays into both nostrils daily as needed for allergies. 01/29/15   Modena Jansky, MD  furosemide (LASIX) 40 MG tablet Take 1 tablet (40 mg total) by mouth daily. 03/31/16 07/28/17  Rosalita Chessman Chase, DO  glucose blood test strip Check blood sugar twice daily 02/02/15   Rosalita Chessman Chase, DO  loratadine (CLARITIN) 10 MG tablet Take 1 tablet (10 mg total) by mouth daily as needed for allergies. 01/29/15   Modena Jansky, MD  NONFORMULARY OR COMPOUNDED ITEM Compression stockings 20-30 mm hg #1 dx-edema 06/26/15   Rosalita Chessman Chase, DO  NONFORMULARY OR COMPOUNDED ITEM Compression socks-- dx edema   20-30 mm/ hg 03/31/16   Rosalita Chessman Chase, DO  pantoprazole (PROTONIX) 40 MG tablet Take 2 tablets by mouth  daily 10/29/15   Rosalita Chessman Chase, DO  potassium chloride (KLOR-CON) 20 MEQ packet Take 20 mEq by mouth daily. 03/06/15   [name], DO    Family History Family History  Problem Relation Age of Onset  . Hyperlipidemia Mother   . Hypertension Mother   . Cancer Mother 63    hodgkins lymphoma  . Diabetes Father   . Hyperlipidemia Brother   . Diabetes Paternal Grandmother   . Colon cancer Neg Hx   . Heart attack Neg Hx   . Sudden death Neg Hx     Social History Social History  Substance Use Topics  . Smoking status: Never Smoker  . Smokeless tobacco: Never Used  . Alcohol use No     Allergies   Ace inhibitors; Cheese; Red dye; and Blain Pais allergy]   Review of Systems Review of Systems  HENT: Positive for sinus pressure.   Gastrointestinal: Positive for diarrhea and vomiting.  Allergic/Immunologic: Positive for immunocompromised state.       Diabetic  Neurological: Positive for weakness.    All other systems reviewed and are negative.    Physical Exam Updated Vital Signs BP (!) 157/76 (BP Location: Left Arm)   Pulse 60   Temp 98.2 F (36.8 C) (Oral)   Resp 18   Ht 5\' 6"  (1.676 m)   Wt 278 lb (126.1 kg)   SpO2 100%   BMI 44.87 kg/m   Physical Exam  Constitutional: She appears well-developed and well-nourished. No distress.  HENT:  Head: Normocephalic and atraumatic.  Eyes: Conjunctivae are normal. Pupils are equal, round, and reactive to light.  Neck: Neck supple. No tracheal deviation present. No thyromegaly present.  Cardiovascular: Normal rate and regular rhythm.   No murmur heard. Pulmonary/Chest: Effort normal and breath sounds normal.  Abdominal: Soft. Bowel sounds are normal. She  exhibits no distension. There is no tenderness.  Obese  Musculoskeletal: Normal range of motion. She exhibits no edema or tenderness.  Neurological: She is alert. Coordination normal.  Skin: Skin is warm and dry. No rash noted.  Psychiatric: She has a normal mood and affect.  Nursing note and vitals reviewed.    ED Treatments / Results  Labs (all labs ordered are listed, but only abnormal results are displayed) Labs Reviewed  CBG MONITORING, ED - Abnormal; Notable for the following:       Result Value   Glucose-Capillary 116 (*)    All other components within normal limits   Results for orders placed or performed during the hospital encounter of 07/30/16  CBC with Differential/Platelet  Result Value Ref Range   WBC 4.5 4.0 - 10.5 K/uL   RBC 4.44 3.87 - 5.11 MIL/uL   Hemoglobin 13.2 12.0 - 15.0 g/dL   HCT 39.5 36.0 - 46.0 %   MCV 89.0 78.0 - 100.0 fL   MCH 29.7 26.0 - 34.0 pg   MCHC 33.4 30.0 - 36.0 g/dL   RDW 13.2 11.5 - 15.5 %   Platelets 181 150 - 400 K/uL   Neutrophils Relative % 82 %   Neutro Abs 3.7 1.7 - 7.7 K/uL   Lymphocytes Relative 16 %   Lymphs Abs 0.7 0.7 - 4.0 K/uL   Monocytes Relative 2 %   Monocytes Absolute 0.1 0.1 - 1.0 K/uL   Eosinophils  Relative 0 %   Eosinophils Absolute 0.0 0.0 - 0.7 K/uL   Basophils Relative 0 %   Basophils Absolute 0.0 0.0 - 0.1 K/uL  Basic metabolic panel  Result Value Ref Range   Sodium 140 135 - 145 mmol/L   Potassium 3.7 3.5 - 5.1 mmol/L   Chloride 108 101 - 111 mmol/L   CO2 24 22 - 32 mmol/L   Glucose, Bld 126 (H) 65 - 99 mg/dL   BUN 16 6 - 20 mg/dL   Creatinine, Ser 0.90 0.44 - 1.00 mg/dL   Calcium 8.6 (L) 8.9 - 10.3 mg/dL   GFR calc non Af Amer >60 >60 mL/min   GFR calc Af Amer >60 >60 mL/min   Anion gap 8 5 - 15  CBG monitoring, ED  Result Value Ref Range   Glucose-Capillary 116 (H) 65 - 99 mg/dL   No results found. EKG  EKG Interpretation None       Radiology No results found.  Procedures Procedures (including critical care time)  Medications Ordered in ED Medications - No data to display   Initial Impression / Assessment and Plan / ED Course  I have reviewed the triage vital signs and the nursing notes.  Pertinent labs & imaging results that were available during my care of the patient were reviewed by me and considered in my medical decision making (see chart for details).     4:15 PM patient feels improved after treatment with intravenous hydration and Tylenol. She is alert and ambulatory. Not lightheaded on standing. Feels ready to go home. She is able to drink without nausea or vomiting. Plan encourage oral hydration. Tylenol for aches Blood pressure recheck 3 weeks. Prescription Reglan. Imodium for diarrhea. Avoid dairy. Final Clinical Impressions(s) / ED Diagnoses  Diagnosis #1 nausea vomiting and diarrhea #2 elevated blood pressure Final diagnoses:  None    New Prescriptions New Prescriptions   No medications on file     Orlie Dakin, MD 07/30/16 1625

## 2016-07-30 NOTE — Discharge Instructions (Signed)
Avoid milk or foods that contain milk such as cheese restroom all having diarrhea. Take Imodium as directed for diarrhea. Take the medication prescribed as needed for nausea.Make sure that you drink at least six 8 ounce glasses of water or sugar free Gatorade each day in order to stay well-hydrated. Take Tylenol as directed for aches. See Dr.Lowne in the office if not feeling better by next week. Return if unable to hold down fluids without vomiting after taking the medication prescribed or if concern for any reason

## 2016-07-30 NOTE — ED Triage Notes (Signed)
Pt states vomiting, diarrhea, low blood sugar, high BP and dizziness all starting this monring

## 2016-08-04 ENCOUNTER — Telehealth: Payer: Self-pay | Admitting: Family Medicine

## 2016-08-04 ENCOUNTER — Encounter: Payer: Self-pay | Admitting: Family Medicine

## 2016-08-04 ENCOUNTER — Ambulatory Visit (INDEPENDENT_AMBULATORY_CARE_PROVIDER_SITE_OTHER): Payer: 59 | Admitting: Family Medicine

## 2016-08-04 VITALS — BP 126/70 | HR 78 | Temp 98.0°F | Ht 66.0 in | Wt 303.8 lb

## 2016-08-04 DIAGNOSIS — J301 Allergic rhinitis due to pollen: Secondary | ICD-10-CM

## 2016-08-04 DIAGNOSIS — R030 Elevated blood-pressure reading, without diagnosis of hypertension: Secondary | ICD-10-CM

## 2016-08-04 MED ORDER — FLUTICASONE PROPIONATE 50 MCG/ACT NA SUSP: 2.0000 | Freq: Every day | NASAL | 3 refills | Status: DC

## 2016-08-04 NOTE — Addendum Note (Signed)
Addended by: Ames Coupe on: 08/04/2016 03:07 PM   Modules accepted: Level of Service

## 2016-08-04 NOTE — Progress Notes (Signed)
Pre visit review using our clinic review tool, if applicable. No additional management support is needed unless otherwise documented below in the visit note. 

## 2016-08-04 NOTE — Progress Notes (Addendum)
Chief Complaint  Patient presents with  . Follow-up    for elevated BP-fasical pressure and around the back of neck    Tammy [name] here for URI complaints.  Duration: 4 days  Associated symptoms: sinus headache, sinus pain, itchy/watery eyes and ear fullness Denies: sinus congestion, rhinorrhea, ear drainage, sore throat, shortness of breath and myalgia Treatment to date: Claritin Sick contacts: No   The patient also had an episode of vomiting, sweating, and elevated blood pressure on Thursday. She went to the emergency department and had a negative workup. She is concerned as to what causes transient elevation and what to do moving forward. Her blood pressure has since normalized when checking at home. She is compliant with her medicine.  ROS:  Const: Denies fevers HEENT: As noted in HPI Lungs: No SOB  Past Medical History:  Diagnosis Date  . Acute on chronic appendicitis s/p lap appy 06/10/2012 06/10/2012   surgery done 06-10-12  . Diabetes mellitus    TYPE 2- no meds in several months-was taken off meds -monitoring blood sugar.  Marland Kitchen GERD (gastroesophageal reflux disease)   . Hyperlipidemia   . Hypertension   . Morbid obesity - BMI > 50 12/13/2012  . Pneumonia    none recent  . Poor venous access    "usually difficult stick"  . Sinusitis    Family History  Problem Relation Age of Onset  . Hyperlipidemia Mother   . Hypertension Mother   . Cancer Mother 6    hodgkins lymphoma  . Diabetes Father   . Hyperlipidemia Brother   . Diabetes Paternal Grandmother   . Colon cancer Neg Hx   . Heart attack Neg Hx   . Sudden death Neg Hx     BP 126/70 (BP Location: Left Arm, Patient Position: Sitting, Cuff Size: Large)   Pulse 78   Temp 98 F (36.7 C) (Oral)   Ht 5\' 6"  (1.676 m)   Wt (!) 303 lb 12.8 oz (137.8 kg)   SpO2 97%   BMI 49.03 kg/m  General: Awake, alert, appears stated age HEENT: AT, [place], ears patent b/l and TM's neg, nares patent w/o discharge, some pressure  over max sinuses b/l, pharynx pink and without exudates, MMM Neck: No masses or asymmetry Heart: RRR, no murmurs, no bruits Lungs: CTAB, no accessory muscle use Psych: Age appropriate judgment and insight, normal mood and affect  Elevated blood pressure reading  Chronic seasonal allergic rhinitis due to pollen - Plan: fluticasone (FLONASE) 50 MCG/ACT nasal spray  Orders as above. Sounds more like allergies than a bacterial sinus infection given improvement with Claritin and pollen spreading its reaches in the area.  Restart Flonase, could start stronger PO antihistamine. Options provided in AVS. Continue to push fluids, practice good hand hygiene, cover mouth when coughing. We'll have her check her home blood pressure at home and return for a nurse visit in the next 2 weeks. Depending on these readings, may need to change her blood pressure medication regimen. If starting to experience fevers, shaking, or shortness of breath, seek immediate care. F/u in 2 weeks for BP nurse visit. Pt voiced understanding and agreement to the plan.  [place], DO 08/04/16 3:07 PM

## 2016-08-04 NOTE — Telephone Encounter (Signed)
We already okd this a while back---

## 2016-08-04 NOTE — Patient Instructions (Addendum)
Around 3 times per week, check your blood pressure 4 times per day. Twice in the morning and twice in the evening. The readings should be at least one minute apart. Write down these values and bring them to your next nurse visit/appointment.  When you check your BP, make sure you have been doing something calm/relaxing 5 minutes prior to checking. Both feet should be flat on the floor and you should be sitting. Use your left arm and make sure it is in a relaxed position (on a table), and that the cuff is at the approximate level/height of your heart.   Claritin (loratadine), Allegra (fexofenadine), Zyrtec (cetirizine); these are listed in order from weakest to strongest. Generic, and therefore cheaper, options are in the parentheses.   Flonase (fluticasone); nasal spray that is over the counter. 2 sprays each nostril, once daily. Aim towards the same side eye when you spray.  There are available OTC, and the generic versions, which may be cheaper, are in parentheses. Show this to a pharmacist if you have trouble finding any of these items.

## 2016-08-04 NOTE — Telephone Encounter (Signed)
Patient requesting to change pcp from Dr. Carollee Herter to Dr. Birdie Riddle.  Patient states Dr. Virgil Benedict expertise is what she needs for her specific medical conditions.  She states she has seen Dr. Birdie Riddle in the past for acute visits.

## 2016-08-10 NOTE — Telephone Encounter (Signed)
Ok to switch 

## 2016-08-14 ENCOUNTER — Ambulatory Visit: Payer: 59 | Admitting: Skilled Nursing Facility1

## 2016-08-22 DIAGNOSIS — H1045 Other chronic allergic conjunctivitis: Secondary | ICD-10-CM | POA: Diagnosis not present

## 2016-08-25 NOTE — Telephone Encounter (Signed)
Left voice mail message for patient to call back to schedule appt.

## 2016-08-26 NOTE — Telephone Encounter (Signed)
Patient returned call - attempted to schedule appointment with her and she stated that she would call back to schedule.

## 2016-09-02 ENCOUNTER — Telehealth: Payer: Self-pay | Admitting: Family Medicine

## 2016-09-02 NOTE — Telephone Encounter (Signed)
Called and gave her the website address for [employer] [place]. Also mailed her a brochure.

## 2016-09-02 NOTE — Telephone Encounter (Signed)
Caller name: Relationship to patient: Self Can be reached: [phone]  Pharmacy:  Reason for call: Patient request a call to get the name of the website that provider gave her to purchase compression socks.

## 2016-09-17 ENCOUNTER — Encounter: Payer: Self-pay | Admitting: Gynecology

## 2016-10-31 DIAGNOSIS — Z9884 Bariatric surgery status: Secondary | ICD-10-CM | POA: Diagnosis not present

## 2016-11-28 ENCOUNTER — Ambulatory Visit (INDEPENDENT_AMBULATORY_CARE_PROVIDER_SITE_OTHER): Payer: 59 | Admitting: Family Medicine

## 2016-11-28 ENCOUNTER — Encounter: Payer: Self-pay | Admitting: Family Medicine

## 2016-11-28 VITALS — BP 126/80 | HR 65 | Temp 98.1°F | Resp 16 | Ht 66.0 in | Wt 319.0 lb

## 2016-11-28 DIAGNOSIS — R600 Localized edema: Secondary | ICD-10-CM | POA: Diagnosis not present

## 2016-11-28 DIAGNOSIS — Z889 Allergy status to unspecified drugs, medicaments and biological substances status: Secondary | ICD-10-CM | POA: Diagnosis not present

## 2016-11-28 DIAGNOSIS — E1151 Type 2 diabetes mellitus with diabetic peripheral angiopathy without gangrene: Secondary | ICD-10-CM

## 2016-11-28 DIAGNOSIS — Z114 Encounter for screening for human immunodeficiency virus [HIV]: Secondary | ICD-10-CM

## 2016-11-28 DIAGNOSIS — E785 Hyperlipidemia, unspecified: Secondary | ICD-10-CM | POA: Diagnosis not present

## 2016-11-28 DIAGNOSIS — I10 Essential (primary) hypertension: Secondary | ICD-10-CM

## 2016-11-28 LAB — COMPREHENSIVE METABOLIC PANEL
ALBUMIN: 3.8 g/dL (ref 3.5–5.2)
ALT: 27 U/L (ref 0–35)
AST: 24 U/L (ref 0–37)
Alkaline Phosphatase: 87 U/L (ref 39–117)
BILIRUBIN TOTAL: 0.7 mg/dL (ref 0.2–1.2)
BUN: 11 mg/dL (ref 6–23)
CHLORIDE: 106 meq/L (ref 96–112)
CO2: 27 mEq/L (ref 19–32)
CREATININE: 0.94 mg/dL (ref 0.40–1.20)
Calcium: 8.8 mg/dL (ref 8.4–10.5)
GFR: 79.27 mL/min (ref >60.00)
Glucose, Bld: 89 mg/dL (ref 70–99)
Potassium: 3.5 mEq/L (ref 3.5–5.1)
SODIUM: 141 meq/L (ref 135–145)
Total Protein: 7.1 g/dL (ref 6.0–8.3)

## 2016-11-28 LAB — LIPID PANEL
CHOLESTEROL: 163 mg/dL (ref 0–200)
HDL: 62.5 mg/dL (ref >39.00)
LDL Cholesterol: 93 mg/dL (ref 0–99)
NONHDL: 100.53
TRIGLYCERIDES: 39 mg/dL (ref 0.0–149.0)
Total CHOL/HDL Ratio: 3
VLDL: 7.8 mg/dL (ref 0.0–40.0)

## 2016-11-28 LAB — HEMOGLOBIN A1C: Hgb A1c MFr Bld: 5.8 % (ref 4.6–6.5)

## 2016-11-28 MED ORDER — NONFORMULARY OR COMPOUNDED ITEM: 0 refills | Status: DC

## 2016-11-28 MED ORDER — GLUCOSE BLOOD VI STRP: ORAL_STRIP | 11 refills | Status: DC

## 2016-11-28 MED ORDER — LORATADINE 10 MG PO TABS: 10.0000 mg | ORAL_TABLET | Freq: Every day | ORAL | Status: DC | PRN

## 2016-11-28 NOTE — Patient Instructions (Signed)

## 2016-11-28 NOTE — Progress Notes (Signed)
Patient ID: Tammy [name], female   [name]: 02-21-1961, 56 y.o.   MRN: [id number]     Subjective:  I acted as a Education administrator for Dr. Carollee [name].  Tammy [name], [place]   Patient ID: Tammy [name], female    [name]: Dec 17, 1960, 56 y.o.   MRN: [id number]  Chief Complaint  Patient presents with  . Hypertension  . Hyperlipidemia  . Diabetes    HPI  Patient is in today for follow up cholesterol, diabetes, and cholesterol.  She checks her sugars twice a day.  Her morning readings for glucose is about 80-95 and aftenoon reading is about 110 120.  Patient Care Team: Tammy [name], Tammy Apa, DO as PCP - General Tammy [name] Tammy Neas, MD as Consulting Physician (Gastroenterology)   Past Medical History:  Diagnosis Date  . Acute on chronic appendicitis s/p lap appy 06/10/2012 06/10/2012   surgery done 06-10-12  . Diabetes mellitus    TYPE 2- no meds in several months-was taken off meds -monitoring blood sugar.  Marland Kitchen GERD (gastroesophageal reflux disease)   . Hyperlipidemia   . Hypertension   . Morbid obesity - BMI > 50 12/13/2012  . Pneumonia    none recent  . Poor venous access    "usually difficult stick"  . Sinusitis     Past Surgical History:  Procedure Laterality Date  . ABDOMINAL HYSTERECTOMY    . APPENDECTOMY     2'14  . COLONOSCOPY  04/11/11   5 mm polyp removed but not recovered  . GASTRIC ROUX-EN-Y N/A 02/27/2015   Procedure: LAPAROSCOPIC ROUX-EN-Y GASTRIC BYPASS WITH UPPER ENDOSCOPY;  Surgeon: Tammy Hausen, MD;  Location: [facility];  Service: General;  Laterality: N/A;  . KNEE SURGERY Right     KNEE SURGERY   . LAPAROSCOPIC APPENDECTOMY  06/10/2012   Procedure: APPENDECTOMY LAPAROSCOPIC;  Surgeon: Tammy Hector, MD;  Location: [facility];  Service: General;  Laterality: N/A;  . UPPER GASTROINTESTINAL ENDOSCOPY  04/14/2006   Normal    Family History  Problem Relation Age of Onset  . Hyperlipidemia Mother   . Hypertension Mother   . Cancer Mother 50       hodgkins lymphoma  . Diabetes Father     . Heart failure Father   . Hyperlipidemia Brother   . Diabetes Paternal Grandmother   . Colon cancer Neg Hx   . Heart attack Neg Hx   . Sudden death Neg Hx     Social History   Social History  . Marital status: Married    Spouse name: N/A  . Number of children: 0  . Years of education: N/A   Occupational History  . 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 Smoking status: Never Smoker  . Smokeless tobacco: Never Used  . Alcohol use No  . Drug use: No  . Sexual activity: Yes    Partners: Male   Other Topics Concern  . Not on file   Social History Narrative  . No narrative on file    Outpatient Medications Prior to Visit  Medication Sig Dispense Refill  . amLODipine (NORVASC) 5 MG tablet Take 1 tablet (5 mg total) by mouth daily. 90 tablet 3  . fluticasone (FLONASE) 50 MCG/ACT nasal spray Place 2 sprays into both nostrils daily. 48 g 3  . furosemide (LASIX) 40 MG tablet Take 1 tablet (40 mg total) by mouth daily. 90 tablet 3  . pantoprazole (PROTONIX) 40 MG tablet Take 2 tablets by  mouth  daily 180 tablet 3  . potassium chloride (KLOR-CON) 20 MEQ packet Take 20 mEq by mouth daily. 30 packet 5  . glucose blood test strip Check blood sugar twice daily 100 each 11  . loratadine (CLARITIN) 10 MG tablet Take 1 tablet (10 mg total) by mouth daily as needed for allergies.    Marland Kitchen albuterol (PROVENTIL HFA;VENTOLIN HFA) 108 (90 Base) MCG/ACT inhaler Inhale 2 puffs into the lungs every 6 (six) hours as needed for wheezing or shortness of breath. 1 Inhaler 2  . cyclobenzaprine (FLEXERIL) 10 MG tablet Take 1 tablet (10 mg total) by mouth 3 (three) times daily as needed for muscle spasms. 30 tablet 0  . Elastic Bandages & Supports (FUTURO SHEER SUPPORT HOSE) MISC 1 packet by Other route daily. 1 pair of support hose to wear daily 1 each 0  . metoCLOPramide (REGLAN) 10 MG tablet Take 1 tablet (10 mg total) by mouth every 6 (six) hours as needed for nausea  (nausea/headache). 6 tablet 0  . NONFORMULARY OR COMPOUNDED ITEM Compression stockings 20-30 mm hg #1 dx-edema 1 each 0  . NONFORMULARY OR COMPOUNDED ITEM Compression socks-- dx edema   20-30 mm/ hg 1 each 1   No facility-administered medications prior to visit.     Allergies  Allergen Reactions  . Ace Inhibitors Anaphylaxis    Bowel angioedema  . Cheese Nausea And Vomiting  . Red Dye Nausea And Vomiting  . [name] Flash [Fish Allergy] Nausea And Vomiting    Review of Systems  Constitutional: Negative for fever and malaise/fatigue.  HENT: Negative for congestion.   Eyes: Negative for blurred vision.  Respiratory: Negative for cough and shortness of breath.   Cardiovascular: Negative for chest pain, palpitations and leg swelling.  Gastrointestinal: Negative for vomiting.  Musculoskeletal: Negative for back pain.  Skin: Negative for rash.  Neurological: Negative for loss of consciousness and headaches.       Objective:    Physical Exam  Constitutional: She is oriented to person, place, and time. No distress.  HENT:  Head: Normocephalic and atraumatic.  Eyes: Conjunctivae and EOM are normal.  Neck: Normal range of motion. Neck supple. No JVD present. Carotid bruit is not present. No thyromegaly present.  Cardiovascular: Normal rate, regular rhythm and normal heart sounds.   No murmur heard. Pulmonary/Chest: Effort normal and breath sounds normal. No respiratory distress. She has no wheezes. She has no rales. She exhibits no tenderness.  Abdominal: Soft. Bowel sounds are normal. There is no tenderness.  Musculoskeletal: Normal range of motion. She exhibits edema. She exhibits no tenderness or deformity.       Right ankle: She exhibits swelling.       Left ankle: She exhibits swelling.       Right lower leg: She exhibits edema.       Left lower leg: She exhibits edema.  Neurological: She is alert and oriented to person, place, and time.  Skin: Skin is warm and dry. She is not  diaphoretic.  Psychiatric: She has a normal mood and affect.  Nursing note and vitals reviewed.   BP 126/80 (BP Location: Left Arm, Cuff Size: Large)   Pulse 65   Temp 98.1 F (36.7 C) (Oral)   Resp 16   Ht 5\' 6"  (1.676 m)   Wt (!) 319 lb (144.7 kg)   SpO2 98%   BMI 51.49 kg/m  Wt Readings from Last 3 Encounters:  11/28/16 (!) 319 lb (144.7 kg)  08/04/16 (!) 303 lb  12.8 oz (137.8 kg)  07/30/16 278 lb (126.1 kg)   BP Readings from Last 3 Encounters:  11/28/16 126/80  08/04/16 126/70  07/30/16 (!) 152/79     Immunization History  Administered Date(s) Administered  . Influenza Whole 03/03/2008  . Influenza,inj,Quad PF,36+ Mos 03/31/2016  . Pneumococcal Polysaccharide-23 03/19/2007, 10/13/2013  . Td 07/20/2007    Health Maintenance  Topic Date Due  . FOOT EXAM  10/14/2014  . COLONOSCOPY  04/10/2016  . INFLUENZA VACCINE  12/03/2016  . OPHTHALMOLOGY EXAM  04/29/2017  . URINE MICROALBUMIN  05/07/2017  . HEMOGLOBIN A1C  05/31/2017  . TETANUS/TDAP  07/19/2017  . MAMMOGRAM  04/18/2018  . PNEUMOCOCCAL POLYSACCHARIDE VACCINE  Completed  . Hepatitis C Screening  Completed  . HIV Screening  Completed    Lab Results  Component Value Date   WBC 4.5 07/30/2016   HGB 13.2 07/30/2016   HCT 39.5 07/30/2016   PLT 181 07/30/2016   GLUCOSE 89 11/28/2016   CHOL 163 11/28/2016   TRIG 39.0 11/28/2016   HDL 62.50 11/28/2016   LDLDIRECT 129.1 10/17/2009   LDLCALC 93 11/28/2016   ALT 27 11/28/2016   AST 24 11/28/2016   NA 141 11/28/2016   K 3.5 11/28/2016   CL 106 11/28/2016   CREATININE 0.94 11/28/2016   BUN 11 11/28/2016   CO2 27 11/28/2016   TSH 3.181 02/21/2014   INR 1.1 (H) 10/16/2014   HGBA1C 5.8 11/28/2016   MICROALBUR <0.7 05/07/2016    Lab Results  Component Value Date   TSH 3.181 02/21/2014   Lab Results  Component Value Date   WBC 4.5 07/30/2016   HGB 13.2 07/30/2016   HCT 39.5 07/30/2016   MCV 89.0 07/30/2016   PLT 181 07/30/2016   Lab Results    Component Value Date   NA 141 11/28/2016   K 3.5 11/28/2016   CO2 27 11/28/2016   GLUCOSE 89 11/28/2016   BUN 11 11/28/2016   CREATININE 0.94 11/28/2016   BILITOT 0.7 11/28/2016   ALKPHOS 87 11/28/2016   AST 24 11/28/2016   ALT 27 11/28/2016   PROT 7.1 11/28/2016   ALBUMIN 3.8 11/28/2016   CALCIUM 8.8 11/28/2016   ANIONGAP 8 07/30/2016   GFR 79.27 11/28/2016   Lab Results  Component Value Date   CHOL 163 11/28/2016   Lab Results  Component Value Date   HDL 62.50 11/28/2016   Lab Results  Component Value Date   LDLCALC 93 11/28/2016   Lab Results  Component Value Date   TRIG 39.0 11/28/2016   Lab Results  Component Value Date   CHOLHDL 3 11/28/2016   Lab Results  Component Value Date   HGBA1C 5.8 11/28/2016         Assessment & Plan:   Problem List Items Addressed This Visit      Unprioritized   DM (diabetes mellitus) type II, controlled, with peripheral vascular disorder ([place]) - Primary    hgba1c to be done, minimize simple carbs. Increase exercise as tolerated. Continue current meds      Relevant Medications   glucose blood test strip   NONFORMULARY OR COMPOUNDED ITEM   NONFORMULARY OR COMPOUNDED ITEM   Other Relevant Orders   Hemoglobin A1c (Completed)   Comprehensive metabolic panel (Completed)   Essential hypertension    Well controlled, no changes to meds. Encouraged heart healthy diet such as the DASH diet and exercise as tolerated.       Relevant Orders   Lipid panel (Completed)  Hyperlipidemia LDL goal <70    Tolerating statin, encouraged heart healthy diet, avoid trans fats, minimize simple carbs and saturated fats. Increase exercise as tolerated      Relevant Orders   Comprehensive metabolic panel (Completed)   Lipid panel (Completed)   Lower extremity edema    Lymphedema-- refer to lymph edema clinic      Relevant Orders   Ambulatory referral to Physical Therapy    Other Visit Diagnoses    Encounter for screening for HIV        Relevant Orders   HIV antibody (Completed)   Multiple allergies       Relevant Medications   loratadine (CLARITIN) 10 MG tablet      I have discontinued Ms. Fiorito's FUTURO SHEER SUPPORT HOSE, albuterol, NONFORMULARY OR COMPOUNDED ITEM, NONFORMULARY OR COMPOUNDED ITEM, cyclobenzaprine, and metoCLOPramide. I am also having her start on NONFORMULARY OR COMPOUNDED ITEM and NONFORMULARY OR COMPOUNDED ITEM. Additionally, I am having her maintain her potassium chloride, pantoprazole, amLODipine, furosemide, fluticasone, loratadine, and glucose blood.  Meds ordered this encounter  Medications  . loratadine (CLARITIN) 10 MG tablet    Sig: Take 1 tablet (10 mg total) by mouth daily as needed for allergies.  Marland Kitchen glucose blood test strip    Sig: Check blood sugar twice daily    Dispense:  100 each    Refill:  11    Onetouch ultra blue test strips  . NONFORMULARY OR COMPOUNDED ITEM    Sig: Compression stockings 20-81mm/hg #1  Diagnosis: Edema    Dispense:  2 each    Refill:  0  . NONFORMULARY OR COMPOUNDED ITEM    Sig: Compression socks 20-45mm/hg   DX: edema    Dispense:  2 each    Refill:  0    CMA served as scribe during this visit. History, Physical and Plan performed by medical provider. Documentation and orders reviewed and attested to.  [name], DO

## 2016-11-29 ENCOUNTER — Other Ambulatory Visit: Payer: Self-pay | Admitting: Family Medicine

## 2016-11-29 LAB — HIV ANTIBODY (ROUTINE TESTING W REFLEX): HIV: NONREACTIVE

## 2016-11-30 NOTE — Assessment & Plan Note (Signed)
hgba1c to be done, minimize simple carbs. Increase exercise as tolerated. Continue current meds  

## 2016-11-30 NOTE — Assessment & Plan Note (Signed)
Lymphedema-- refer to lymph edema clinic

## 2016-11-30 NOTE — Assessment & Plan Note (Signed)
Tolerating statin, encouraged heart healthy diet, avoid trans fats, minimize simple carbs and saturated fats. Increase exercise as tolerated 

## 2016-11-30 NOTE — Assessment & Plan Note (Signed)
Well controlled, no changes to meds. Encouraged heart healthy diet such as the DASH diet and exercise as tolerated.  °

## 2016-12-01 ENCOUNTER — Other Ambulatory Visit: Payer: Self-pay | Admitting: Family Medicine

## 2016-12-01 DIAGNOSIS — E785 Hyperlipidemia, unspecified: Secondary | ICD-10-CM

## 2016-12-22 ENCOUNTER — Ambulatory Visit: Payer: 59 | Admitting: Physical Therapy

## 2017-01-09 DIAGNOSIS — I890 Lymphedema, not elsewhere classified: Secondary | ICD-10-CM | POA: Diagnosis not present

## 2017-01-14 ENCOUNTER — Encounter: Payer: Self-pay | Admitting: Registered"

## 2017-01-14 ENCOUNTER — Encounter: Payer: 59 | Attending: Surgery | Admitting: Registered"

## 2017-01-14 DIAGNOSIS — E119 Type 2 diabetes mellitus without complications: Secondary | ICD-10-CM

## 2017-01-14 DIAGNOSIS — Z713 Dietary counseling and surveillance: Secondary | ICD-10-CM | POA: Diagnosis present

## 2017-01-14 NOTE — Patient Instructions (Addendum)
Goals:  Follow Bariatric Surgery Specialized Post-Op Diet (ideas)  Aim for maximum of 15 grams of carbs per meal/10-15 grams per snack  Avoid white starches and starchy veggies (potatoes, peas, corn, etc)  Avoid sweetened drinks  Eat 3-6 small meals/snacks, every 3-5 hrs  No meal skipping  Increase lean protein foods to meet 60-80g goal  Always have a protein source with carbs  Increase fluid intake to 64oz +  Aim for >30 min of physical activity daily    Resume daily vitamin supplementation   *(1) complete multivitamin with iron and 2-3 doses of 500 mg calcium citrate  * Make sure to separate the multivitamin and calcium by 2 hours to prevent iron/calcium from binding together and leaving your body  * Make sure to take your calcium in 3 separate doses because your body cannot absorb more than 500 mg at a time  - Track food and fluid using Baritastic App.   Try Bariatric-specific vitamins: Procare  - Try over-the-counter calcium supplements: TUMS, Viactiv, Citracal.

## 2017-01-14 NOTE — Progress Notes (Signed)
Follow-up visit:  23 Months Post-Operative RYGB Surgery  Medical Nutrition Therapy:  Appt start time: 4:00 end time:  4:45.  Primary concerns today: Post-operative Bariatric Surgery Nutrition Management.  Non scale victories: none stated  Surgery date: 02/27/2015 Surgery type: RYGB Start weight at [facility]: 336 lbs on 02/11/2014 Weight today: Pt declined Weight change: N/A from 295.6 on 02/19/2016 due to pt declining weight today Total weight lost: 40.4 lbs Weight loss goal: none stated  TANITA  BODY COMP RESULTS  01/14/2017   BMI (kg/m^2) Pt declined   Fat Mass (lbs)    Fat Free Mass (lbs)    Total Body Water (lbs)     Pt states she had visit with surgeon and hrealizes she has gotten off track. Pt states she "eats by mood"; eats whenever. Pt states this routine started in 2023-05-10 after her dad passed away unexpectedly. Pt states she has an emotional attachment to eating. 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 Pt states she likes peanut butter and eats a lot of peanut butter crackers. Pt states she recently started drinking more water. Pt states she is seeing someone at [facility] to help with managing water weight and swelling in legs. Pt states she was advised not to exercise until they are able to get rid of some of her swelling. Pt states she will be able to exercise after Thanksgiving 2018.   Preferred Learning Style:   No preference indicated   Learning Readiness:   Contemplating  Ready  Change in progress  24-hr recall: B (AM): banana, peanut butter crackers (4g) Snk (AM): none  L (PM): BLT sandwich or salad with grilled chicken (21g) Snk (PM): none  D (PM): skips Snk (PM): Special K w/ pecans (4g), 2% lactaid milk (8g)  Fluid intake: tea, coffee, water w/ lemon; ~ 36 oz Estimated total protein intake: ~37g protein  Medications: See list Supplementation: Flintstones + B12  CBG monitoring:  morning and at bedtime Average CBG per patient: FBS (89-92), at bedtime (110-125) Last patient reported A1c: 5.8 on 11/28/2016  Using straws: yes Drinking while eating: yes Having you been chewing well: yes Chewing/swallowing difficulties: no Changes in vision: no Changes to mood/headaches: no Hair loss/Changes to skin/Changes to nails: yes soon after surgery, no, no Any difficulty focusing or concentrating: no Sweating: yes sometimes Dizziness/Lightheaded: no Palpitations: no  Carbonated beverages: sometimes N/V/D/C/GAS: no, no, yes to greens, gas from using straws Abdominal Pain: no Dumping syndrome: no Last Lap-Band fill: N/A  Recent physical activity:  none  Progress Towards Goal(s):  In progress.  Handouts given during visit include:  Phase 3A: High Protein  Vitamin and Mineral Recommendations  Protein Shakes   Nutritional Diagnosis:  Inadequate fluid intake As related to bariatric surgery post-op recommendations.  As evidenced by pt report of less than 64 oz fluid daily. NI-5.7.1 Inadequate protein intake As related to bariatric surgery post-op recommendations .  As evidenced by pt report of less than 60g protein daily.    Intervention:  Nutrition education and counseling. RD educated and counseled pt on importance of meeting protein and fluid needs. Pt was educated and counseling on bariatric-specific multivitamins and appropriate calcium supplements.   Goals:  Follow Bariatric Surgery Specialized Post-Op Diet (ideas)  Aim for maximum of 15 grams of carbs per meal/10-15 grams per snack  Avoid white starches and starchy veggies (potatoes, peas, corn, etc)  Avoid sweetened drinks  Eat 3-6  small meals/snacks, every 3-5 hrs  No meal skipping  Increase lean protein foods to meet 60-80g goal  Always have a protein source with carbs  Increase fluid intake to 64oz +  Aim for >30 min of physical activity daily    Resume daily vitamin supplementation    *(1) complete multivitamin with iron and 2-3 doses of 500 mg calcium citrate  * Make sure to separate the multivitamin and calcium by 2 hours to prevent iron/calcium from binding together and leaving your body  * Make sure to take your calcium in 3 separate doses because your body cannot absorb more than 500 mg at a time - Track food and fluid using Baritastic App.  Try Bariatric-specific vitamins: Procare - Try over-the-counter calcium supplements: TUMS, Viactiv, Citracal.  Teaching Method Utilized:  Visual Auditory  Barriers to learning/adherence to lifestyle change: work-life balance  Demonstrated degree of understanding via:  Teach Back   Monitoring/Evaluation:  Dietary intake, exercise, lap band fills, and body weight. Follow up in 1 months for 2 year post-op visit.

## 2017-02-16 ENCOUNTER — Ambulatory Visit: Payer: 59 | Admitting: Registered"

## 2017-02-19 ENCOUNTER — Encounter: Payer: Self-pay | Admitting: Internal Medicine

## 2017-02-25 DIAGNOSIS — I890 Lymphedema, not elsewhere classified: Secondary | ICD-10-CM | POA: Diagnosis not present

## 2017-03-03 ENCOUNTER — Telehealth: Payer: Self-pay | Admitting: Family Medicine

## 2017-03-03 DIAGNOSIS — R600 Localized edema: Secondary | ICD-10-CM

## 2017-03-03 NOTE — Telephone Encounter (Signed)
Call pt for details. Pt states COMPRESSION WRAP  FOR WHOLE BODY FOR LIPIDEMIA needs prescribed by Carollee Herter so therapist can order it for her. Please call pt [phone] today so it can be faxed tomorrow.

## 2017-03-03 NOTE — Telephone Encounter (Signed)
Ok to send rx for this

## 2017-03-04 ENCOUNTER — Telehealth: Payer: Self-pay | Admitting: Family Medicine

## 2017-03-04 NOTE — Telephone Encounter (Signed)
Pt called back. States we referred her to [facility] for edema. They are requesting Rx for bilateral lower extremity compression from foot to thigh @ 20-40 mm/Hg be faxed to [phone]. Order faxed. Message sent to pt via mychart re: status of Rx.

## 2017-03-04 NOTE — Telephone Encounter (Signed)
Left message for pt to return my call. Needs information of where to send Rx to / fax #.

## 2017-03-10 ENCOUNTER — Telehealth: Payer: Self-pay

## 2017-03-10 NOTE — Telephone Encounter (Signed)
If her BMI is > 50 when you see her she can be done direct at hospital

## 2017-03-10 NOTE — Telephone Encounter (Signed)
Dr Carlean Purl ([facility])'  July 2018 BMI>50.  Direct [facility] pt or would you like an OV first? Adoria Kawamoto/PV

## 2017-03-13 ENCOUNTER — Encounter: Payer: 59 | Admitting: Internal Medicine

## 2017-03-16 ENCOUNTER — Other Ambulatory Visit: Payer: Self-pay

## 2017-03-16 ENCOUNTER — Ambulatory Visit (AMBULATORY_SURGERY_CENTER): Payer: Self-pay | Admitting: *Deleted

## 2017-03-16 VITALS — Ht 66.0 in | Wt 321.6 lb

## 2017-03-16 DIAGNOSIS — Z86010 Personal history of colonic polyps: Secondary | ICD-10-CM

## 2017-03-16 DIAGNOSIS — I890 Lymphedema, not elsewhere classified: Secondary | ICD-10-CM | POA: Diagnosis not present

## 2017-03-16 NOTE — Progress Notes (Signed)
No egg or soy allergy known to patient  No issues with past sedation with any surgeries  or procedures, no intubation problems   Some nausea after  No diet pills per patient No home 02 use per patient  No blood thinners per patient  Pt denies issues with constipation once in a while after surgery.  Currently not. No A fib or A flutter  EMMI video sent to pt's e mail

## 2017-03-17 ENCOUNTER — Telehealth: Payer: Self-pay | Admitting: Family Medicine

## 2017-03-17 NOTE — Telephone Encounter (Signed)
Patient Name: Tammy [name]  [name]: 06-Oct-1960    Initial Comment Caller states c/o left shoulder and arm ache. She thinks it's from carrying her purse.   Nurse Assessment  Nurse: Raphael Gibney, RN, Vanita Ingles Date/Time Eilene Ghazi Time): 03/17/2017 1:04:08 PM  Confirm and document reason for call. If symptomatic, describe symptoms. ---Caller states she is having pain in her left shoulder and left arm. Has had pain for 2 weeks. Thinks it is from carrying her purse. Pain is more of an ache than a pain. Pain level 4.  Does the patient have any new or worsening symptoms? ---Yes  Will a triage be completed? ---Yes  Related visit to physician within the last 2 weeks? ---No  Does the PT have any chronic conditions? (i.e. diabetes, asthma, etc.) ---Yes  List chronic conditions. ---diabetes; HTN  Is this a behavioral health or substance abuse call? ---No     Guidelines    Guideline Title Affirmed Question Affirmed Notes  Arm Pain [1] MODERATE pain (e.g., interferes with normal activities) AND [2] present > 3 days    Final Disposition User   See PCP When Office is Open (within 3 days) Raphael Gibney, RN, Vanita Ingles    Comments  appt scheduled for 03/20/17 at 11:15 am with Dr. Roma Schanz   Referrals  REFERRED TO PCP OFFICE   Caller Disagree/Comply Comply  Caller Understands Yes  PreDisposition Call Doctor

## 2017-03-18 ENCOUNTER — Encounter: Payer: Self-pay | Admitting: Internal Medicine

## 2017-03-20 ENCOUNTER — Ambulatory Visit: Payer: 59 | Admitting: Family Medicine

## 2017-03-20 ENCOUNTER — Encounter: Payer: Self-pay | Admitting: Family Medicine

## 2017-03-20 VITALS — BP 136/78 | HR 52 | Temp 98.4°F | Wt 318.2 lb

## 2017-03-20 DIAGNOSIS — M25512 Pain in left shoulder: Secondary | ICD-10-CM | POA: Diagnosis not present

## 2017-03-20 DIAGNOSIS — Z889 Allergy status to unspecified drugs, medicaments and biological substances status: Secondary | ICD-10-CM | POA: Diagnosis not present

## 2017-03-20 DIAGNOSIS — M25522 Pain in left elbow: Secondary | ICD-10-CM | POA: Diagnosis not present

## 2017-03-20 MED ORDER — MELOXICAM 7.5 MG PO TABS: 7.5000 mg | ORAL_TABLET | Freq: Every day | ORAL | 2 refills | Status: DC

## 2017-03-20 MED ORDER — LORATADINE 10 MG PO TABS: 10.0000 mg | ORAL_TABLET | Freq: Every day | ORAL | 1 refills | Status: DC | PRN

## 2017-03-20 NOTE — Patient Instructions (Signed)
Shoulder Pain Many things can cause shoulder pain, including:  An injury to the area.  Overuse of the shoulder.  Arthritis. The source of the pain can be:  Inflammation.  An injury to the shoulder joint.  An injury to a tendon, ligament, or bone. Follow these instructions at home: Take these actions to help with your pain:  Squeeze a soft ball or a foam pad as much as possible. This helps to keep the shoulder from swelling. It also helps to strengthen the arm.  Take over-the-counter and prescription medicines only as told by your health care provider.  If directed, apply ice to the area:  Put ice in a plastic bag.  Place a towel between your skin and the bag.  Leave the ice on for 20 minutes, 2-3 times per day. Stop applying ice if it does not help with the pain.  If you were given a shoulder sling or immobilizer:  Wear it as told.  Remove it to shower or bathe.  Move your arm as little as possible, but keep your hand moving to prevent swelling. Contact a health care provider if:  Your pain gets worse.  Your pain is not relieved with medicines.  New pain develops in your arm, hand, or fingers. Get help right away if:  Your arm, hand, or fingers:  Tingle.  Become numb.  Become swollen.  Become painful.  Turn white or blue. This information is not intended to replace advice given to you by your health care provider. Make sure you discuss any questions you have with your health care provider. Document Released: 01/29/2005 Document Revised: 12/16/2015 Document Reviewed: 08/14/2014 Elsevier Interactive Patient Education  2017 [employer]  

## 2017-03-20 NOTE — Progress Notes (Signed)
Patient ID: Tammy [name], female    [name]: July 04, 1960  Age: 56 y.o. MRN: [id number]    Subjective:  Subjective  HPI Tammy [name] presents for L shoulder / arm pain.  X 2 weeks.  No injury.  No cp, no sob.  Pt does carry a heavy pocketbook over her L shoulder   She tried motrin occasionally and that helps.    Review of Systems  Constitutional: Negative for appetite change, diaphoresis, fatigue and unexpected weight change.  Eyes: Negative for pain, redness and visual disturbance.  Respiratory: Negative for cough, chest tightness, shortness of breath and wheezing.   Cardiovascular: Negative for chest pain, palpitations and leg swelling.  Endocrine: Negative for cold intolerance, heat intolerance, polydipsia, polyphagia and polyuria.  Genitourinary: Negative for difficulty urinating, dysuria and frequency.  Neurological: Negative for dizziness, light-headedness, numbness and headaches.    History Past Medical History:  Diagnosis Date  . Acute on chronic appendicitis s/p lap appy 06/10/2012 06/10/2012   surgery done 06-10-12  . Diabetes mellitus    TYPE 2- no meds in several months-was taken off meds -monitoring blood sugar.  Marland Kitchen GERD (gastroesophageal reflux disease)   . Hyperlipidemia   . Hypertension   . Morbid obesity - BMI > 50 12/13/2012  . Pneumonia    none recent  . Poor venous access    "usually difficult stick"  . Sinusitis     She has a past surgical history that includes Abdominal hysterectomy; Knee surgery (Right); Upper gastrointestinal endoscopy (04/14/2006); Colonoscopy (04/11/11); laparoscopic appendectomy (06/10/2012); Appendectomy; Gastric Roux-En-Y (N/A, 02/27/2015); and Polypectomy.   Her family history includes Cancer (age of onset: 51) in her mother; Diabetes in her father and paternal grandmother; Heart failure in her father; Hyperlipidemia in her brother and mother; Hypertension in her mother.She reports that  has never smoked. she has never used smokeless  tobacco. She reports that she does not drink alcohol or use drugs.  Current Outpatient Medications on File Prior to Visit  Medication Sig Dispense Refill  . amLODipine (NORVASC) 5 MG tablet Take 1 tablet (5 mg total) by mouth daily. 90 tablet 3  . bisacodyl (DULCOLAX) 5 MG EC tablet Take 5 mg daily as needed by mouth for moderate constipation. As directed per prep instructions/colonoscopy    . calcium carbonate (TUMS - DOSED IN MG ELEMENTAL CALCIUM) 500 MG chewable tablet Chew 1 tablet 2 (two) times daily by mouth.    . fluticasone (FLONASE) 50 MCG/ACT nasal spray Place 2 sprays into both nostrils daily. 48 g 3  . furosemide (LASIX) 40 MG tablet Take 1 tablet (40 mg total) by mouth daily. 90 tablet 3  . glucose blood test strip Check blood sugar twice daily 100 each 11  . LOTEMAX 0.5 % GEL INSTILL 1 GTT INTO OU TID  2  . Multiple Vitamins-Minerals (MULTIVITAMIN ADULT PO) Take 1 tablet daily by mouth. flintstone vitamins    . NONFORMULARY OR COMPOUNDED ITEM Compression stockings 20-39mm/hg #1  Diagnosis: Edema 2 each 0  . pantoprazole (PROTONIX) 40 MG tablet TAKE 2 TABLETS BY MOUTH  DAILY 180 tablet 1  . polyethylene glycol powder (MIRALAX) powder Take 1 Container once by mouth. As directed for prep instructions for colonoscopy    . potassium chloride (KLOR-CON) 20 MEQ packet Take 20 mEq by mouth daily. 30 packet 5   No current facility-administered medications on file prior to visit.      Objective:  Objective  Physical Exam  Constitutional: She is oriented to person,  place, and time. She appears well-developed and well-nourished.  HENT:  Head: Normocephalic and atraumatic.  Eyes: Conjunctivae and EOM are normal.  Neck: Normal range of motion. Neck supple. No JVD present. Carotid bruit is not present. No thyromegaly present.  Cardiovascular: Normal rate, regular rhythm and normal heart sounds.  No murmur heard. Pulmonary/Chest: Effort normal and breath sounds normal. No respiratory  distress. She has no wheezes. She has no rales. She exhibits no tenderness.  Musculoskeletal: She exhibits edema.       Left shoulder: She exhibits decreased range of motion and tenderness. She exhibits no swelling, no effusion and no crepitus.       Left elbow: Tenderness found.  Neurological: She is alert and oriented to person, place, and time.  Psychiatric: She has a normal mood and affect.  Nursing note and vitals reviewed.  BP 136/78 (BP Location: Left Arm, Patient Position: Sitting, Cuff Size: Large)   Pulse (!) 52   Temp 98.4 F (36.9 C) (Oral)   Wt (!) 318 lb 4 oz (144.4 kg)   SpO2 98%   BMI 51.37 kg/m  Wt Readings from Last 3 Encounters:  03/20/17 (!) 318 lb 4 oz (144.4 kg)  03/16/17 (!) 321 lb 9.6 oz (145.9 kg)  11/28/16 (!) 319 lb (144.7 kg)     Lab Results  Component Value Date   WBC 4.5 07/30/2016   HGB 13.2 07/30/2016   HCT 39.5 07/30/2016   PLT 181 07/30/2016   GLUCOSE 89 11/28/2016   CHOL 163 11/28/2016   TRIG 39.0 11/28/2016   HDL 62.50 11/28/2016   LDLDIRECT 129.1 10/17/2009   LDLCALC 93 11/28/2016   ALT 27 11/28/2016   AST 24 11/28/2016   NA 141 11/28/2016   K 3.5 11/28/2016   CL 106 11/28/2016   CREATININE 0.94 11/28/2016   BUN 11 11/28/2016   CO2 27 11/28/2016   TSH 3.181 02/21/2014   INR 1.1 (H) 10/16/2014   HGBA1C 5.8 11/28/2016   MICROALBUR <0.7 05/07/2016    No results found.   Assessment & Plan:  Plan  I have changed Tammy [name]'s loratadine. I am also having her start on meloxicam. Additionally, I am having her maintain her potassium chloride, amLODipine, furosemide, fluticasone, glucose blood, NONFORMULARY OR COMPOUNDED ITEM, pantoprazole, LOTEMAX, calcium carbonate, Multiple Vitamins-Minerals (MULTIVITAMIN ADULT PO), polyethylene glycol powder, and bisacodyl.  Meds ordered this encounter  Medications  . loratadine (CLARITIN) 10 MG tablet    Sig: Take 1 tablet (10 mg total) daily as needed by mouth for allergies.     Dispense:  90 tablet    Refill:  1  . meloxicam (MOBIC) 7.5 MG tablet    Sig: Take 1 tablet (7.5 mg total) daily by mouth.    Dispense:  30 tablet    Refill:  2    Problem List Items Addressed This Visit    None    Visit Diagnoses    Acute pain of left shoulder    -  Primary   Relevant Medications   meloxicam (MOBIC) 7.5 MG tablet   Multiple allergies       Relevant Medications   loratadine (CLARITIN) 10 MG tablet   Elbow pain, left       Relevant Medications   meloxicam (MOBIC) 7.5 MG tablet    refer to sport med/ ortho if pain does not improve Follow-up: Return if symptoms worsen or fail to improve.  [name], DO

## 2017-03-31 ENCOUNTER — Other Ambulatory Visit: Payer: Self-pay | Admitting: Family Medicine

## 2017-03-31 DIAGNOSIS — Z1231 Encounter for screening mammogram for malignant neoplasm of breast: Secondary | ICD-10-CM

## 2017-04-03 ENCOUNTER — Telehealth: Payer: Self-pay | Admitting: Family Medicine

## 2017-04-03 DIAGNOSIS — M79602 Pain in left arm: Secondary | ICD-10-CM

## 2017-04-03 DIAGNOSIS — M25512 Pain in left shoulder: Secondary | ICD-10-CM

## 2017-04-03 NOTE — Telephone Encounter (Signed)
Copied from [place] [phone]. Topic: General - Other >> Apr 03, 2017 11:43 AM Darl Householder, RMA wrote: Reason for CRM: patient is returning call to Dr. Cheri Rous concerning her left arm and shoulder pain, pt states she is not feeling any better and wants to know the next step she should take concerning physical therapy

## 2017-04-06 NOTE — Telephone Encounter (Signed)
I had recommended sport med if pain persists---  Just check on pt preference to sport med her or at [place] and ok to put in referral

## 2017-04-07 NOTE — Telephone Encounter (Signed)
Called patient left detailed message regarding referral to Sports Med. Patient to call back with location preference.

## 2017-04-08 DIAGNOSIS — I890 Lymphedema, not elsewhere classified: Secondary | ICD-10-CM | POA: Diagnosis not present

## 2017-04-09 ENCOUNTER — Other Ambulatory Visit: Payer: Self-pay | Admitting: Family Medicine

## 2017-04-09 ENCOUNTER — Encounter: Payer: Self-pay | Admitting: Internal Medicine

## 2017-04-09 NOTE — Telephone Encounter (Signed)
Patient returned call and states she would like referral placed with Dr. Karlton Lemon

## 2017-04-09 NOTE — Telephone Encounter (Signed)
Copied from [place]. Topic: Quick Communication - See Telephone Encounter >> Apr 09, 2017  4:15 PM Oneta Rack wrote: CRM for notification. See Telephone encounter for:   04/09/17.   Relation to pt: self Call back number:[phone] Pharmacy: [facility] - [place], [place] - [address] [phone] (Phone) [phone] (Fax)   Reason for call:  Patient was advised by mail order loratadine (CLARITIN) 10 MG tablet is considered OTC and to contact PCP requesting 90 day supply Rx, please send to retail, please advise

## 2017-04-10 ENCOUNTER — Other Ambulatory Visit: Payer: Self-pay | Admitting: *Deleted

## 2017-04-10 DIAGNOSIS — Z889 Allergy status to unspecified drugs, medicaments and biological substances status: Secondary | ICD-10-CM

## 2017-04-10 MED ORDER — LORATADINE 10 MG PO TABS: 10.0000 mg | ORAL_TABLET | Freq: Every day | ORAL | 1 refills | Status: DC | PRN

## 2017-04-10 NOTE — Telephone Encounter (Signed)
Ok to refer.

## 2017-04-10 NOTE — Telephone Encounter (Signed)
Message forwarded to Dr. Etter Sjogren and referral coordinator

## 2017-04-15 NOTE — Telephone Encounter (Signed)
Referral placed and patient notified.  

## 2017-04-20 ENCOUNTER — Other Ambulatory Visit: Payer: Self-pay | Admitting: Family Medicine

## 2017-04-20 DIAGNOSIS — E43 Unspecified severe protein-calorie malnutrition: Secondary | ICD-10-CM

## 2017-04-20 DIAGNOSIS — I10 Essential (primary) hypertension: Secondary | ICD-10-CM

## 2017-04-20 MED ORDER — LORATADINE 10 MG PO TABS: 10.0000 mg | ORAL_TABLET | Freq: Every day | ORAL | 3 refills | Status: DC

## 2017-04-20 NOTE — Telephone Encounter (Signed)
Rx sent 

## 2017-04-27 ENCOUNTER — Telehealth: Payer: Self-pay | Admitting: *Deleted

## 2017-04-27 NOTE — Telephone Encounter (Signed)
Received Physician Orders/CMN for Pneumatic Compression Devices from [facility]; forwarded to provider/SLS 12/24

## 2017-04-29 ENCOUNTER — Ambulatory Visit
Admission: RE | Admit: 2017-04-29 | Discharge: 2017-04-29 | Disposition: A | Discharge status: Home / Self Care | Payer: 59 | Source: Ambulatory Visit | Attending: Family Medicine | Admitting: Family Medicine

## 2017-04-29 DIAGNOSIS — Z1231 Encounter for screening mammogram for malignant neoplasm of breast: Secondary | ICD-10-CM | POA: Diagnosis not present

## 2017-04-30 ENCOUNTER — Encounter: Payer: Self-pay | Admitting: Internal Medicine

## 2017-05-01 ENCOUNTER — Encounter (HOSPITAL_COMMUNITY): Payer: Self-pay

## 2017-05-01 ENCOUNTER — Other Ambulatory Visit: Payer: Self-pay

## 2017-05-07 DIAGNOSIS — I890 Lymphedema, not elsewhere classified: Secondary | ICD-10-CM | POA: Diagnosis not present

## 2017-05-10 NOTE — Anesthesia Preprocedure Evaluation (Addendum)
Anesthesia Evaluation  Patient identified by MRN, date of birth, ID band Patient awake    Reviewed: Allergy & Precautions, H&P , Patient's Chart, lab work & pertinent test results, reviewed documented beta blocker date and time   Airway Mallampati: II  TM Distance: >3 FB Neck ROM: full    Dental no notable dental hx.    Pulmonary    Pulmonary exam normal breath sounds clear to auscultation       Cardiovascular hypertension,  Rhythm:regular Rate:Normal     Neuro/Psych    GI/Hepatic   Endo/Other  diabetesMorbid obesity  Renal/GU      Musculoskeletal   Abdominal   Peds  Hematology   Anesthesia Other Findings Was BMI 53 before gastic bypass; easy to intubate and mask in 2014   Reproductive/Obstetrics                            Anesthesia Physical Anesthesia Plan  ASA: III  Anesthesia Plan: MAC   Post-op Pain Management:    Induction: Intravenous  PONV Risk Score and Plan:   Airway Management Planned: Mask and Natural Airway  Additional Equipment:   Intra-op Plan:   Post-operative Plan:   Informed Consent: I have reviewed the patients History and Physical, chart, labs and discussed the procedure including the risks, benefits and alternatives for the proposed anesthesia with the patient or authorized representative who has indicated his/her understanding and acceptance.   Dental Advisory Given  Plan Discussed with: CRNA and Surgeon  Anesthesia Plan Comments:        Anesthesia Quick Evaluation

## 2017-05-11 ENCOUNTER — Ambulatory Visit (HOSPITAL_COMMUNITY): Payer: 59 | Admitting: Anesthesiology

## 2017-05-11 ENCOUNTER — Encounter (HOSPITAL_COMMUNITY): Payer: Self-pay | Admitting: *Deleted

## 2017-05-11 ENCOUNTER — Ambulatory Visit (HOSPITAL_COMMUNITY)
Admission: RE | Admit: 2017-05-11 | Discharge: 2017-05-11 | Disposition: A | Discharge status: Home / Self Care | Payer: 59 | Source: Ambulatory Visit | Attending: Internal Medicine | Admitting: Internal Medicine

## 2017-05-11 ENCOUNTER — Other Ambulatory Visit: Payer: Self-pay

## 2017-05-11 ENCOUNTER — Encounter (HOSPITAL_COMMUNITY)
Admission: RE | Disposition: A | Discharge status: Home / Self Care | Payer: Self-pay | Source: Ambulatory Visit | Attending: Internal Medicine

## 2017-05-11 DIAGNOSIS — Z86010 Personal history of colonic polyps: Secondary | ICD-10-CM | POA: Diagnosis not present

## 2017-05-11 DIAGNOSIS — Z1211 Encounter for screening for malignant neoplasm of colon: Secondary | ICD-10-CM | POA: Insufficient documentation

## 2017-05-11 DIAGNOSIS — Z9884 Bariatric surgery status: Secondary | ICD-10-CM | POA: Diagnosis not present

## 2017-05-11 DIAGNOSIS — E119 Type 2 diabetes mellitus without complications: Secondary | ICD-10-CM | POA: Insufficient documentation

## 2017-05-11 DIAGNOSIS — Z91018 Allergy to other foods: Secondary | ICD-10-CM | POA: Diagnosis not present

## 2017-05-11 DIAGNOSIS — Z79899 Other long term (current) drug therapy: Secondary | ICD-10-CM | POA: Diagnosis not present

## 2017-05-11 DIAGNOSIS — Z888 Allergy status to other drugs, medicaments and biological substances status: Secondary | ICD-10-CM | POA: Insufficient documentation

## 2017-05-11 DIAGNOSIS — Z6841 Body Mass Index (BMI) 40.0 and over, adult: Secondary | ICD-10-CM | POA: Diagnosis not present

## 2017-05-11 DIAGNOSIS — E785 Hyperlipidemia, unspecified: Secondary | ICD-10-CM | POA: Diagnosis not present

## 2017-05-11 DIAGNOSIS — I10 Essential (primary) hypertension: Secondary | ICD-10-CM | POA: Insufficient documentation

## 2017-05-11 DIAGNOSIS — Z91048 Other nonmedicinal substance allergy status: Secondary | ICD-10-CM | POA: Insufficient documentation

## 2017-05-11 DIAGNOSIS — K219 Gastro-esophageal reflux disease without esophagitis: Secondary | ICD-10-CM | POA: Insufficient documentation

## 2017-05-11 DIAGNOSIS — R109 Unspecified abdominal pain: Secondary | ICD-10-CM | POA: Diagnosis not present

## 2017-05-11 HISTORY — DX: Unspecified osteoarthritis, unspecified site: M19.90

## 2017-05-11 HISTORY — DX: Personal history of colonic polyps: Z86.010

## 2017-05-11 HISTORY — PX: COLONOSCOPY WITH PROPOFOL: SHX5780

## 2017-05-11 HISTORY — DX: Other specified postprocedural states: Z98.890

## 2017-05-11 HISTORY — DX: Other complications of anesthesia, initial encounter: T88.59XA

## 2017-05-11 HISTORY — DX: Personal history of colon polyps, unspecified: Z86.0100

## 2017-05-11 HISTORY — DX: Adverse effect of unspecified anesthetic, initial encounter: T41.45XA

## 2017-05-11 HISTORY — DX: Nausea with vomiting, unspecified: R11.2

## 2017-05-11 SURGERY — COLONOSCOPY WITH PROPOFOL
Anesthesia: Monitor Anesthesia Care

## 2017-05-11 MED ORDER — PROPOFOL 10 MG/ML IV BOLUS
INTRAVENOUS | Status: AC
  Filled 2017-05-11: qty 40

## 2017-05-11 MED ORDER — SODIUM CHLORIDE 0.9 % IV SOLN: INTRAVENOUS | Status: DC

## 2017-05-11 MED ORDER — LACTATED RINGERS IV SOLN
INTRAVENOUS | Status: DC | PRN
  Administered 2017-05-11: 09:00:00 via INTRAVENOUS

## 2017-05-11 MED ORDER — PROPOFOL 10 MG/ML IV BOLUS
INTRAVENOUS | Status: DC | PRN
  Administered 2017-05-11: 20 mg via INTRAVENOUS
  Administered 2017-05-11: 30 mg via INTRAVENOUS
  Administered 2017-05-11: 10 mg via INTRAVENOUS
  Administered 2017-05-11: 20 mg via INTRAVENOUS
  Administered 2017-05-11: 10 mg via INTRAVENOUS
  Administered 2017-05-11: 20 mg via INTRAVENOUS
  Administered 2017-05-11: 30 mg via INTRAVENOUS
  Administered 2017-05-11 (×5): 20 mg via INTRAVENOUS
  Administered 2017-05-11: 10 mg via INTRAVENOUS

## 2017-05-11 SURGICAL SUPPLY — 22 items

## 2017-05-11 NOTE — Discharge Instructions (Signed)
° °  No polyps today! Next routine colonoscopy or other screening test in 10 years - 2029.  I appreciate the opportunity to care for you. Gatha Mayer, MD, FACG  YOU HAD AN ENDOSCOPIC PROCEDURE TODAY: Refer to the procedure report and other information in the discharge instructions given to you for any specific questions about what was found during the examination. If this information does not answer your questions, please call Dr. Celesta Aver office at [phone] to clarify.   YOU SHOULD EXPECT: Some feelings of bloating in the abdomen. Passage of more gas than usual. Walking can help get rid of the air that was put into your GI tract during the procedure and reduce the bloating. If you had a lower endoscopy (such as a colonoscopy or flexible sigmoidoscopy) you may notice spotting of blood in your stool or on the toilet paper. Some abdominal soreness may be present for a day or two, also.  DIET: Your first meal following the procedure should be a light meal and then it is ok to progress to your normal diet. A half-sandwich or bowl of soup is an example of a good first meal. Heavy or fried foods are harder to digest and may make you feel nauseous or bloated. Drink plenty of fluids but you should avoid alcoholic beverages for 24 hours.   ACTIVITY: Your care partner should take you home directly after the procedure. You should plan to take it easy, moving slowly for the rest of the day. You can resume normal activity the day after the procedure however YOU SHOULD NOT DRIVE, use power tools, machinery or perform tasks that involve climbing or major physical exertion for 24 hours (because of the sedation medicines used during the test).   SYMPTOMS TO REPORT IMMEDIATELY: A gastroenterologist can be reached at any hour. Please call [phone]  for any of the following symptoms:  Following lower endoscopy (colonoscopy, flexible sigmoidoscopy) Excessive amounts of blood in the stool  Significant  tenderness, worsening of abdominal pains  Swelling of the abdomen that is new, acute  Fever of 100 or higher

## 2017-05-11 NOTE — H&P (Signed)
[facility] History and Physical   Primary Care Physician:  [name], DO   Reason for Procedure:   History of colon polyps  Plan:    Colonoscopy The risks and benefits as well as alternatives of endoscopic procedure(s) have been reviewed. All questions answered. The patient agrees to proceed.   HPI: Tammy [name] is a 57 y.o. female with hx colonic polyps - last had 5 mm polyp removed but not recovered about 5 years ago. Hx adenoma before that. Denies GI problems at this time.   Past Medical History:  Diagnosis Date  . Acute on chronic appendicitis s/p lap appy 06/10/2012 06/10/2012   surgery done 06-10-12  . Arthritis    ankle  . Complication of anesthesia   . Diabetes mellitus    TYPE 2- no meds in several months-was taken off meds -monitoring blood sugar.  Marland Kitchen GERD (gastroesophageal reflux disease)   . Hyperlipidemia   . Hypertension   . Morbid obesity - BMI > 50 12/13/2012  . Pneumonia    none recent  . PONV (postoperative nausea and vomiting)   . Poor venous access    "usually difficult stick"  . Sinusitis     Past Surgical History:  Procedure Laterality Date  . ABDOMINAL HYSTERECTOMY    . APPENDECTOMY     2'14  . COLONOSCOPY  04/11/11   5 mm polyp removed but not recovered  . GASTRIC ROUX-EN-Y N/A 02/27/2015   Procedure: LAPAROSCOPIC ROUX-EN-Y GASTRIC BYPASS WITH UPPER ENDOSCOPY;  Surgeon: Johnathan Hausen, MD;  Location: [facility];  Service: General;  Laterality: N/A;  . KNEE SURGERY Right     KNEE SURGERY   . LAPAROSCOPIC APPENDECTOMY  06/10/2012   Procedure: APPENDECTOMY LAPAROSCOPIC;  Surgeon: Adin Hector, MD;  Location: [facility];  Service: General;  Laterality: N/A;  . UPPER GASTROINTESTINAL ENDOSCOPY  04/14/2006   Normal    Prior to Admission medications   Medication Sig Start Date End Date Taking? Authorizing Provider  amLODipine (NORVASC) 5 MG tablet TAKE 1 TABLET BY MOUTH  DAILY 04/20/17  Yes Roma Schanz R, DO  bisacodyl  (DULCOLAX) 5 MG EC tablet Take 5 mg daily as needed by mouth for moderate constipation. As directed per prep instructions/colonoscopy   Yes [provider]  calcium carbonate (TUMS - DOSED IN MG ELEMENTAL CALCIUM) 500 MG chewable tablet Chew 1 tablet 2 (two) times daily by mouth.   Yes [provider]  Cyanocobalamin (VITAMIN B-12 SL) Place 1 drop under the tongue daily. 1 dropperful   Yes [provider]  fluticasone (FLONASE) 50 MCG/ACT nasal spray Place 2 sprays into both nostrils daily. Patient taking differently: Place 2 sprays into both nostrils daily as needed (for allergies.).  08/04/16  Yes Shelda Pal, DO  furosemide (LASIX) 40 MG tablet TAKE 1 TABLET BY MOUTH  DAILY 04/20/17  Yes Roma Schanz R, DO  ibuprofen (ADVIL,MOTRIN) 200 MG tablet Take 400 mg by mouth every 8 (eight) hours as needed (for pain).   Yes [provider]  loratadine (CLARITIN) 10 MG tablet Take 1 tablet (10 mg total) by mouth daily. Patient taking differently: Take 10 mg by mouth at bedtime as needed (for allergies.).  04/20/17  Yes Roma Schanz R, DO  Loteprednol Etabonate (LOTEMAX) 0.5 % GEL Place 1 drop into both eyes 3 (three) times daily as needed (for dry eyes.).   Yes [provider]  Multiple Vitamins-Minerals (MULTIVITAMIN ADULT PO) Take 1 tablet daily  by mouth. flintstone vitamins   Yes [provider]  pantoprazole (PROTONIX) 40 MG tablet TAKE 2 TABLETS BY MOUTH  DAILY 12/01/16  Yes Carollee Herter, Yvonne R, DO  polyethylene glycol powder (MIRALAX) powder Take 1 Container once by mouth. As directed for prep instructions for colonoscopy   Yes [provider]  potassium chloride (KLOR-CON) 20 MEQ packet Take 20 mEq by mouth daily. Patient taking differently: Take 20 mEq by mouth every Wednesday.  03/06/15  Yes Roma Schanz R, DO  glucose blood test strip Check blood sugar twice daily 11/28/16   Carollee Herter, Alferd Apa, DO   meloxicam (MOBIC) 7.5 MG tablet Take 1 tablet (7.5 mg total) daily by mouth. Patient not taking: Reported on 04/30/2017 03/20/17   Roma Schanz R, DO  NONFORMULARY OR COMPOUNDED ITEM Compression stockings 20-64mm/hg #1  Diagnosis: Edema 11/28/16   [name], DO    Current Facility-Administered Medications  Medication Dose Route Frequency Provider Last Rate Last Dose  . 0.9 %  sodium chloride infusion   Intravenous Continuous Gatha Mayer, MD        Allergies as of 03/16/2017 - Review Complete 03/16/2017  Allergen Reaction Noted  . Ace inhibitors Anaphylaxis 02/06/2015  . Cheese Nausea And Vomiting 10/13/2013  . Red dye Nausea And Vomiting 03/23/2012  . [name] Flash [fish allergy] Nausea And Vomiting 03/23/2012    Family History  Problem Relation Age of Onset  . Hyperlipidemia Mother   . Hypertension Mother   . Cancer Mother 74       hodgkins lymphoma  . Diabetes Father   . Heart failure Father   . Hyperlipidemia Brother   . Diabetes Paternal Grandmother   . Breast cancer Paternal Aunt        unsure of age  . Breast cancer Paternal Aunt   . Colon cancer Neg Hx   . Heart attack Neg Hx   . Sudden death Neg Hx   . Colon polyps Neg Hx   . Esophageal cancer Neg Hx   . Stomach cancer Neg Hx   . Rectal cancer Neg Hx     Social History   Socioeconomic History  . Marital status: Married                                         Occupational History  . 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[employer]  Tobacco Use  . Smoking status: Never Smoker  . Smokeless tobacco: Never Used  Substance and Sexual Activity  . Alcohol use: No    Alcohol/week: 0.0 oz  . Drug use: No  . Sexual activity: Yes    Partners: Male   Review of Systems:  All other review of systems negative except as mentioned in the HPI.  Physical Exam: Vital signs in last 24 hours: Temp:  [99.1 F (37.3 C)] 99.1 F (37.3 C) (01/07 0732) Resp:  [17] 17 (01/07 0732) BP:  (154)/(65) 154/65 (01/07 0732) SpO2:  [100 %] 100 % (01/07 0732) Weight:  [310 lb (140.6 kg)] 310 lb (140.6 kg) (01/07 0732)   General:   Alert,  Well-developed, well-nourished, pleasant and cooperative in NAD Lungs:  Clear throughout to auscultation.   Heart:  Regular rate and rhythm; no murmurs, clicks, rubs,  or gallops. Abdomen:  Soft, nontender and nondistended. Normal bowel sounds.   Neuro/Psych:  Alert  and cooperative. Normal mood and affect. A and O x 3   @Masin Shatto  Simonne Maffucci, MD, [facility] [phone] (pager) 05/11/2017 8:38 AM@

## 2017-05-11 NOTE — Op Note (Signed)
[facility] Patient Name: Tammy [name] Procedure Date: 05/11/2017 MRN: [id number] Attending MD: Gatha Mayer , MD Date of Birth: 09-16-1960 CSN: [id number] Age: 57 Admit Type: Outpatient Procedure:                Colonoscopy Indications:              Surveillance: Personal history of colonic polyps                            (unknown histology) on last colonoscopy 5 years ago Providers:                Gatha Mayer, MD, Kingsley Plan, RN, Cherylynn Ridges, Technician, Derrek Gu. Alday CRNA, CRNA Referring MD:              Medicines:                Propofol per Anesthesia, Monitored Anesthesia Care Complications:            No immediate complications. Estimated Blood Loss:     Estimated blood loss: none. Procedure:                Pre-Anesthesia Assessment:                           - Prior to the procedure, a History and Physical                            was performed, and patient medications and                            allergies were reviewed. The patient's tolerance of                            previous anesthesia was also reviewed. The risks                            and benefits of the procedure and the sedation                            options and risks were discussed with the patient.                            All questions were answered, and informed consent                            was obtained. Prior Anticoagulants: The patient has                            taken no previous anticoagulant or antiplatelet                            agents. ASA Grade Assessment: III - A patient with  severe systemic disease. After reviewing the risks                            and benefits, the patient was deemed in                            satisfactory condition to undergo the procedure.                           After obtaining informed consent, the colonoscope                            was passed under  direct vision. Throughout the                            procedure, the patient's blood pressure, pulse, and                            oxygen saturations were monitored continuously. The                            EC-[id number] ([id number]) scope was introduced through                            the anus and advanced to the the cecum, identified                            by appendiceal orifice and ileocecal valve. The                            colonoscopy was performed without difficulty. The                            patient tolerated the procedure well. The quality                            of the bowel preparation was good. The ileocecal                            valve, appendiceal orifice, and rectum were                            photographed. Scope In: 8:57:12 AM Scope Out: 9:14:11 AM Scope Withdrawal Time: 0 hours 12 minutes 7 seconds  Total Procedure Duration: 0 hours 16 minutes 59 seconds  Findings:      The perianal and digital rectal examinations were normal.      The entire examined colon appeared normal on direct and retroflexion       views. Impression:               - The entire examined colon is normal on direct and                            retroflexion views.                           -  No specimens collected. Moderate Sedation:      N/A- Per Anesthesia Care Recommendation:           - Patient has a contact number available for                            emergencies. The signs and symptoms of potential                            delayed complications were discussed with the                            patient. Return to normal activities tomorrow.                            Written discharge instructions were provided to the                            patient.                           - Resume previous diet.                           - Continue present medications.                           - Repeat colonoscopy in 10 years. Procedure Code(s):        ---  Professional ---                           S3419, Colorectal cancer screening; colonoscopy on                            individual at high risk Diagnosis Code(s):        --- Professional ---                           Z86.010, Personal history of colonic polyps CPT copyright 2016 American Medical Association. All rights reserved. The codes documented in this report are preliminary and upon coder review may  be revised to meet current compliance requirements. Gatha Mayer, MD 05/11/2017 9:22:23 AM This report has been signed electronically. Number of Addenda: 0

## 2017-05-11 NOTE — Anesthesia Postprocedure Evaluation (Signed)
Anesthesia Post Note  Patient: Tammy [name]  Procedure(s) Performed: COLONOSCOPY WITH PROPOFOL (N/A )     Patient location during evaluation: PACU Anesthesia Type: MAC Level of consciousness: awake and alert Pain management: pain level controlled Vital Signs Assessment: post-procedure vital signs reviewed and stable Respiratory status: spontaneous breathing, nonlabored ventilation, respiratory function stable and patient connected to nasal cannula oxygen Cardiovascular status: stable and blood pressure returned to baseline Postop Assessment: no apparent nausea or vomiting Anesthetic complications: no    Last Vitals:  Vitals:   05/11/17 0930 05/11/17 0940  BP: (!) 155/86 (!) 160/72  Pulse: 68 (!) 54  Resp: 11 20  Temp:    SpO2: 99% 100%    Last Pain:  Vitals:   05/11/17 0919  TempSrc: Oral                 Ramirez Fullbright EDWARD

## 2017-05-11 NOTE — Transfer of Care (Signed)
Immediate Anesthesia Transfer of Care Note  Patient: Tammy [name]  Procedure(s) Performed: COLONOSCOPY WITH PROPOFOL (N/A )  Patient Location: PACU  Anesthesia Type:MAC  Level of Consciousness: sedated  Airway & Oxygen Therapy: Patient Spontanous Breathing and Patient connected to nasal cannula oxygen  Post-op Assessment: Report given to RN and Post -op Vital signs reviewed and stable  Post vital signs: Reviewed and stable  Last Vitals:  Vitals:   05/11/17 0732  BP: (!) 154/65  Resp: 17  Temp: 37.3 C  SpO2: 100%    Last Pain:  Vitals:   05/11/17 0732  TempSrc: Oral         Complications: No apparent anesthesia complications

## 2017-05-12 ENCOUNTER — Encounter (HOSPITAL_COMMUNITY): Payer: Self-pay | Admitting: Internal Medicine

## 2017-05-25 DIAGNOSIS — Z719 Counseling, unspecified: Secondary | ICD-10-CM | POA: Diagnosis not present

## 2017-06-03 DIAGNOSIS — Z719 Counseling, unspecified: Secondary | ICD-10-CM | POA: Diagnosis not present

## 2017-06-10 DIAGNOSIS — Z719 Counseling, unspecified: Secondary | ICD-10-CM | POA: Diagnosis not present

## 2017-06-17 DIAGNOSIS — Z719 Counseling, unspecified: Secondary | ICD-10-CM | POA: Diagnosis not present

## 2017-06-18 DIAGNOSIS — I890 Lymphedema, not elsewhere classified: Secondary | ICD-10-CM | POA: Diagnosis not present

## 2017-06-18 DIAGNOSIS — R600 Localized edema: Secondary | ICD-10-CM | POA: Diagnosis not present

## 2017-06-24 DIAGNOSIS — I890 Lymphedema, not elsewhere classified: Secondary | ICD-10-CM | POA: Diagnosis not present

## 2017-07-01 DIAGNOSIS — Z719 Counseling, unspecified: Secondary | ICD-10-CM | POA: Diagnosis not present

## 2017-07-10 DIAGNOSIS — Z719 Counseling, unspecified: Secondary | ICD-10-CM | POA: Diagnosis not present

## 2017-07-15 DIAGNOSIS — Z719 Counseling, unspecified: Secondary | ICD-10-CM | POA: Diagnosis not present

## 2017-07-16 DIAGNOSIS — I890 Lymphedema, not elsewhere classified: Secondary | ICD-10-CM | POA: Diagnosis not present

## 2017-07-22 DIAGNOSIS — Z719 Counseling, unspecified: Secondary | ICD-10-CM | POA: Diagnosis not present

## 2017-07-24 DIAGNOSIS — Z9884 Bariatric surgery status: Secondary | ICD-10-CM | POA: Diagnosis not present

## 2017-07-29 DIAGNOSIS — Z719 Counseling, unspecified: Secondary | ICD-10-CM | POA: Diagnosis not present

## 2017-08-03 DIAGNOSIS — I890 Lymphedema, not elsewhere classified: Secondary | ICD-10-CM | POA: Diagnosis not present

## 2017-08-04 ENCOUNTER — Encounter: Payer: Self-pay | Admitting: Family Medicine

## 2017-08-04 ENCOUNTER — Telehealth: Payer: Self-pay | Admitting: *Deleted

## 2017-08-04 ENCOUNTER — Ambulatory Visit (INDEPENDENT_AMBULATORY_CARE_PROVIDER_SITE_OTHER): Payer: 59 | Admitting: Family Medicine

## 2017-08-04 VITALS — BP 130/66 | HR 74 | Temp 98.0°F | Resp 16 | Ht 66.0 in | Wt 329.0 lb

## 2017-08-04 DIAGNOSIS — K219 Gastro-esophageal reflux disease without esophagitis: Secondary | ICD-10-CM | POA: Diagnosis not present

## 2017-08-04 DIAGNOSIS — E785 Hyperlipidemia, unspecified: Secondary | ICD-10-CM | POA: Diagnosis not present

## 2017-08-04 DIAGNOSIS — J301 Allergic rhinitis due to pollen: Secondary | ICD-10-CM | POA: Diagnosis not present

## 2017-08-04 DIAGNOSIS — E1151 Type 2 diabetes mellitus with diabetic peripheral angiopathy without gangrene: Secondary | ICD-10-CM

## 2017-08-04 DIAGNOSIS — I890 Lymphedema, not elsewhere classified: Secondary | ICD-10-CM

## 2017-08-04 DIAGNOSIS — I10 Essential (primary) hypertension: Secondary | ICD-10-CM

## 2017-08-04 DIAGNOSIS — Z23 Encounter for immunization: Secondary | ICD-10-CM | POA: Diagnosis not present

## 2017-08-04 DIAGNOSIS — IMO0002 Reserved for concepts with insufficient information to code with codable children: Secondary | ICD-10-CM

## 2017-08-04 DIAGNOSIS — E1165 Type 2 diabetes mellitus with hyperglycemia: Secondary | ICD-10-CM

## 2017-08-04 DIAGNOSIS — Z0000 Encounter for general adult medical examination without abnormal findings: Secondary | ICD-10-CM

## 2017-08-04 MED ORDER — LORATADINE 10 MG PO TABS: 10.0000 mg | ORAL_TABLET | Freq: Every day | ORAL | 3 refills | Status: DC

## 2017-08-04 MED ORDER — NONFORMULARY OR COMPOUNDED ITEM: 1 refills | Status: DC

## 2017-08-04 MED ORDER — PANTOPRAZOLE SODIUM 40 MG PO TBEC
80.0000 mg | DELAYED_RELEASE_TABLET | Freq: Every day | ORAL | 1 refills | Status: DC
Start: 2017-08-04 — End: 2017-12-28

## 2017-08-04 MED ORDER — FLUTICASONE PROPIONATE 50 MCG/ACT NA SUSP: 2.0000 | Freq: Every day | NASAL | 5 refills | Status: DC | PRN

## 2017-08-04 NOTE — Telephone Encounter (Signed)
error 

## 2017-08-04 NOTE — Progress Notes (Signed)
Subjective:     Tammy [name] is a 57 y.o. female and is here for a comprehensive physical exam. The patient reports problems - lymphedema b/l low ext-- per pt .  Social History   Socioeconomic History  . Marital status: Married    Spouse name: Not on file  . Number of children: 0  . Years of education: Not on file  . Highest education level: Not on file  Occupational History  . 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[employer]  . Financial resource strain: Not on file  . Food insecurity:    Worry: Not on file    Inability: Not on file  . Transportation needs:    Medical: Not on file    Non-medical: Not on file  Tobacco Use  . Smoking status: Never Smoker  . Smokeless tobacco: Never Used  Substance and Sexual Activity  . Alcohol use: No    Alcohol/week: 0.0 oz  . Drug use: No  . Sexual activity: Yes    Partners: Male  Lifestyle  . Physical activity:    Days per week: Not on file    Minutes per session: Not on file  . Stress: Not on file  Relationships  . Social connections:    Talks on phone: Not on file    Gets together: Not on file    Attends religious service: Not on file    Active member of club or organization: Not on file    Attends meetings of clubs or organizations: Not on file    Relationship status: Not on file  . Intimate partner violence:    Fear of current or ex partner: Not on file    Emotionally abused: Not on file    Physically abused: Not on file    Forced sexual activity: Not on file  Other Topics Concern  . Not on file  Social History Narrative   Exercise- no   Health Maintenance  Topic Date Due  . OPHTHALMOLOGY EXAM  04/29/2017  . URINE MICROALBUMIN  05/07/2017  . HEMOGLOBIN A1C  05/31/2017  . TETANUS/TDAP  07/19/2017  . INFLUENZA VACCINE  12/03/2017  . MAMMOGRAM  04/29/2018  . FOOT EXAM  08/05/2018  . COLONOSCOPY  05/11/2022  . PNEUMOCOCCAL POLYSACCHARIDE VACCINE  Completed  . Hepatitis C Screening   Completed  . HIV Screening  Completed    The following portions of the patient's history were reviewed and updated as appropriate:  She  has a past medical history of Acute on chronic appendicitis s/p lap appy 06/10/2012 (06/10/2012), Arthritis, Complication of anesthesia, Diabetes mellitus, GERD (gastroesophageal reflux disease), History of colon polyps, Hyperlipidemia, Hypertension, Morbid obesity - BMI > 50 (12/13/2012), Pneumonia, PONV (postoperative nausea and vomiting), Poor venous access, and Sinusitis. She does not have any pertinent problems on file. She  has a past surgical history that includes Abdominal hysterectomy; Knee surgery (Right); Upper gastrointestinal endoscopy (04/14/2006); Colonoscopy (04/11/11); laparoscopic appendectomy (06/10/2012); Appendectomy; Gastric Roux-En-Y (N/A, 02/27/2015); and Colonoscopy with propofol (N/A, 05/11/2017). Her family history includes Breast cancer in her paternal aunt and paternal aunt; Cancer (age of onset: 17) in her mother; Diabetes in her father and paternal grandmother; Heart failure in her father; Hyperlipidemia in her brother and mother; Hypertension in her mother. She  reports that she has never smoked. She has never used smokeless tobacco. She reports that she does not drink alcohol or use drugs. She has a current medication list which includes the following  prescription(s): amlodipine, calcium carbonate, cyanocobalamin, fluticasone, furosemide, glucose blood, ibuprofen, loratadine, loteprednol etabonate, multiple vitamins-minerals, NONFORMULARY OR COMPOUNDED ITEM, pantoprazole, potassium chloride, and NONFORMULARY OR COMPOUNDED ITEM. Current Outpatient Medications on File Prior to Visit  Medication Sig Dispense Refill  . amLODipine (NORVASC) 5 MG tablet TAKE 1 TABLET BY MOUTH  DAILY 90 tablet 3  . calcium carbonate (TUMS - DOSED IN MG ELEMENTAL CALCIUM) 500 MG chewable tablet Chew 1 tablet 2 (two) times daily by mouth.    . Cyanocobalamin (VITAMIN  B-12 SL) Place 1 drop under the tongue daily. 1 dropperful    . furosemide (LASIX) 40 MG tablet TAKE 1 TABLET BY MOUTH  DAILY 90 tablet 3  . glucose blood test strip Check blood sugar twice daily 100 each 11  . ibuprofen (ADVIL,MOTRIN) 200 MG tablet Take 400 mg by mouth every 8 (eight) hours as needed (for pain).    . Loteprednol Etabonate (LOTEMAX) 0.5 % GEL Place 1 drop into both eyes 3 (three) times daily as needed (for dry eyes.).    Marland Kitchen Multiple Vitamins-Minerals (MULTIVITAMIN ADULT PO) Take 1 tablet daily by mouth. flintstone vitamins    . NONFORMULARY OR COMPOUNDED ITEM Compression stockings 20-63mm/hg #1  Diagnosis: Edema 2 each 0  . potassium chloride (KLOR-CON) 20 MEQ packet Take 20 mEq by mouth daily. (Patient taking differently: Take 20 mEq by mouth every Wednesday. ) 30 packet 5   No current facility-administered medications on file prior to visit.    She is allergic to ace inhibitors; cheese; red dye; and tuna [fish allergy]..  Review of Systems Review of Systems  Constitutional: Negative for activity change, appetite change and fatigue.  HENT: Negative for hearing loss, congestion, tinnitus and ear discharge.  dentist q18m Eyes: Negative for visual disturbance (see optho q1y -- vision corrected to 20/20 with glasses).  Respiratory: Negative for cough, chest tightness and shortness of breath.   Cardiovascular: Negative for chest pain, palpitations and + leg swelling  Gastrointestinal: Negative for abdominal pain, diarrhea, constipation and abdominal distention.  Genitourinary: Negative for urgency, frequency, decreased urine volume and difficulty urinating.  Musculoskeletal: Negative for back pain, arthralgias and gait problem.  Skin: Negative for color change, pallor and rash.  Neurological: Negative for dizziness, light-headedness, numbness and headaches.  Hematological: Negative for adenopathy. Does not bruise/bleed easily.  Psychiatric/Behavioral: Negative for suicidal ideas,  confusion, sleep disturbance, self-injury, dysphoric mood, decreased concentration and agitation.       Objective:    BP 130/66 (BP Location: Left Arm, Cuff Size: Large)   Pulse 74   Temp 98 F (36.7 C) (Oral)   Resp 16   Ht 5\' 6"  (1.676 m)   Wt (!) 329 lb (149.2 kg)   SpO2 94%   BMI 53.10 kg/m  General appearance: alert, cooperative, appears stated age and no distress Head: Normocephalic, without obvious abnormality, atraumatic Eyes: conjunctivae/corneas clear. PERRL, EOM's intact. Fundi benign. Ears: normal TM's and external ear canals both ears Nose: Nares normal. Septum midline. Mucosa normal. No drainage or sinus tenderness. Throat: lips, mucosa, and tongue normal; teeth and gums normal Neck: no adenopathy, no carotid bruit, no JVD, supple, symmetrical, trachea midline and thyroid not enlarged, symmetric, no tenderness/mass/nodules Back: symmetric, no curvature. ROM normal. No CVA tenderness. Lungs: clear to auscultation bilaterally Breasts: normal appearance, no masses or tenderness Heart: regular rate and rhythm, S1, S2 normal, no murmur, click, rub or gallop Abdomen: soft, non-tender; bowel sounds normal; no masses,  no organomegaly Pelvic: not indicated; status post hysterectomy, negative  ROS Extremities: edema lymphedema  b/l low ext Pulses: 2+ and symmetric Skin: Skin color, texture, turgor normal. No rashes or lesions Lymph nodes: Cervical, supraclavicular, and axillary nodes normal. Neurologic: Alert and oriented X 3, normal strength and tone. Normal symmetric reflexes. Normal coordination and gait    Assessment:    Healthy female exam.      Plan:    ghm utd Check labs  See After Visit Summary for Counseling Recommendations    1. Chronic seasonal allergic rhinitis due to pollen stable - fluticasone (FLONASE) 50 MCG/ACT nasal spray; Place 2 sprays into both nostrils daily as needed (for allergies.).  Dispense: 16 g; Refill: 5  2. DM (diabetes mellitus)  type II uncontrolled, periph vascular disorder ([place]) Check labs  con't meds  - Hemoglobin A1c; Future - Comprehensive metabolic panel; Future - Microalbumin / creatinine urine ratio; Future  3. Essential hypertension Well controlled, no changes to meds. Encouraged heart healthy diet such as the DASH diet and exercise as tolerated. --- pt is off meds  - CBC with Differential/Platelet; Future - Comprehensive metabolic panel; Future - Microalbumin / creatinine urine ratio; Future  4. Hyperlipidemia LDL goal <70 Encouraged heart healthy diet, increase exercise, avoid trans fats, consider a krill oil cap daily - Lipid panel; Future - Microalbumin / creatinine urine ratio; Future  5. Preventative health care See above - CBC with Differential/Platelet; Future - Comprehensive metabolic panel; Future - Lipid panel; Future - TSH; Future - Microalbumin / creatinine urine ratio; Future  6. Lymphedema of both lower extremities con't pt and compression stocking s - NONFORMULARY OR COMPOUNDED ITEM; Compression stockings  #1  Dx lymphedema lower extremities  Dispense: 1 each; Refill: 1

## 2017-08-04 NOTE — Patient Instructions (Signed)
Preventive Care 40-64 Years, Female Preventive care refers to lifestyle choices and visits with your health care provider that can promote health and wellness. What does preventive care include?  A yearly physical exam. This is also called an annual well check.  Dental exams once or twice a year.  Routine eye exams. Ask your health care provider how often you should have your eyes checked.  Personal lifestyle choices, including: ? Daily care of your teeth and gums. ? Regular physical activity. ? Eating a healthy diet. ? Avoiding tobacco and drug use. ? Limiting alcohol use. ? Practicing safe sex. ? Taking low-dose aspirin daily starting at age 57. ? Taking vitamin and mineral supplements as recommended by your health care provider. What happens during an annual well check? The services and screenings done by your health care provider during your annual well check will depend on your age, overall health, lifestyle risk factors, and family history of disease. Counseling Your health care provider may ask you questions about your:  Alcohol use.  Tobacco use.  Drug use.  Emotional well-being.  Home and relationship well-being.  Sexual activity.  Eating habits.  Work and work Statistician.  Method of birth control.  Menstrual cycle.  Pregnancy history.  Screening You may have the following tests or measurements:  Height, weight, and BMI.  Blood pressure.  Lipid and cholesterol levels. These may be checked every 5 years, or more frequently if you are over 57 years old.  Skin check.  Lung cancer screening. You may have this screening every year starting at age 57 if you have a 30-pack-year history of smoking and currently smoke or have quit within the past 15 years.  Fecal occult blood test (FOBT) of the stool. You may have this test every year starting at age 57.  Flexible sigmoidoscopy or colonoscopy. You may have a sigmoidoscopy every 5 years or a colonoscopy  every 10 years starting at age 30.  Hepatitis C blood test.  Hepatitis B blood test.  Sexually transmitted disease (STD) testing.  Diabetes screening. This is done by checking your blood sugar (glucose) after you have not eaten for a while (fasting). You may have this done every 1-3 years.  Mammogram. This may be done every 1-2 years. Talk to your health care provider about when you should start having regular mammograms. This may depend on whether you have a family history of breast cancer.  BRCA-related cancer screening. This may be done if you have a family history of breast, ovarian, tubal, or peritoneal cancers.  Pelvic exam and Pap test. This may be done every 3 years starting at age 57. Starting at age 36, this may be done every 5 years if you have a Pap test in combination with an HPV test.  Bone density scan. This is done to screen for osteoporosis. You may have this scan if you are at high risk for osteoporosis.  Discuss your test results, treatment options, and if necessary, the need for more tests with your health care provider. Vaccines Your health care provider may recommend certain vaccines, such as:  Influenza vaccine. This is recommended every year.  Tetanus, diphtheria, and acellular pertussis (Tdap, Td) vaccine. You may need a Td booster every 10 years.  Varicella vaccine. You may need this if you have not been vaccinated.  Zoster vaccine. You may need this after age 57.  Measles, mumps, and rubella (MMR) vaccine. You may need at least one dose of MMR if you were born in  1957 or later. You may also need a second dose.  Pneumococcal 13-valent conjugate (PCV13) vaccine. You may need this if you have certain conditions and were not previously vaccinated.  Pneumococcal polysaccharide (PPSV23) vaccine. You may need one or two doses if you smoke cigarettes or if you have certain conditions.  Meningococcal vaccine. You may need this if you have certain  conditions.  Hepatitis A vaccine. You may need this if you have certain conditions or if you travel or work in places where you may be exposed to hepatitis A.  Hepatitis B vaccine. You may need this if you have certain conditions or if you travel or work in places where you may be exposed to hepatitis B.  Haemophilus influenzae type b (Hib) vaccine. You may need this if you have certain conditions.  Talk to your health care provider about which screenings and vaccines you need and how often you need them. This information is not intended to replace advice given to you by your health care provider. Make sure you discuss any questions you have with your health care provider. Document Released: 05/18/2015 Document Revised: 01/09/2016 Document Reviewed: 02/20/2015 Elsevier Interactive Patient Education  2018 [employer]  

## 2017-08-05 DIAGNOSIS — Z719 Counseling, unspecified: Secondary | ICD-10-CM | POA: Diagnosis not present

## 2017-08-07 ENCOUNTER — Other Ambulatory Visit (INDEPENDENT_AMBULATORY_CARE_PROVIDER_SITE_OTHER): Payer: 59

## 2017-08-07 ENCOUNTER — Other Ambulatory Visit: Payer: 59

## 2017-08-07 DIAGNOSIS — Z0000 Encounter for general adult medical examination without abnormal findings: Secondary | ICD-10-CM | POA: Diagnosis not present

## 2017-08-07 DIAGNOSIS — E1151 Type 2 diabetes mellitus with diabetic peripheral angiopathy without gangrene: Secondary | ICD-10-CM

## 2017-08-07 DIAGNOSIS — I10 Essential (primary) hypertension: Secondary | ICD-10-CM

## 2017-08-07 DIAGNOSIS — E785 Hyperlipidemia, unspecified: Secondary | ICD-10-CM | POA: Diagnosis not present

## 2017-08-07 DIAGNOSIS — E1165 Type 2 diabetes mellitus with hyperglycemia: Secondary | ICD-10-CM

## 2017-08-07 DIAGNOSIS — IMO0002 Reserved for concepts with insufficient information to code with codable children: Secondary | ICD-10-CM

## 2017-08-07 LAB — CBC WITH DIFFERENTIAL/PLATELET
BASOS PCT: 0.7 % (ref 0.0–3.0)
Basophils Absolute: 0 10*3/uL (ref 0.0–0.1)
EOS ABS: 0.1 10*3/uL (ref 0.0–0.7)
EOS PCT: 2.5 % (ref 0.0–5.0)
HEMATOCRIT: 38.1 % (ref 36.0–46.0)
HEMOGLOBIN: 12.7 g/dL (ref 12.0–15.0)
LYMPHS PCT: 30.4 % (ref 12.0–46.0)
Lymphs Abs: 1 10*3/uL (ref 0.7–4.0)
MCHC: 33.3 g/dL (ref 30.0–36.0)
MCV: 88 fl (ref 78.0–100.0)
MONOS PCT: 12 % (ref 3.0–12.0)
Monocytes Absolute: 0.4 10*3/uL (ref 0.1–1.0)
Neutro Abs: 1.8 10*3/uL (ref 1.4–7.7)
Neutrophils Relative %: 54.4 % (ref 43.0–77.0)
Platelets: 242 10*3/uL (ref 150.0–400.0)
RBC: 4.33 Mil/uL (ref 3.87–5.11)
RDW: 14.3 % (ref 11.5–15.5)
WBC: 3.3 10*3/uL — AB (ref 4.0–10.5)

## 2017-08-07 LAB — COMPREHENSIVE METABOLIC PANEL
ALBUMIN: 3.8 g/dL (ref 3.5–5.2)
ALT: 17 U/L (ref 0–35)
AST: 19 U/L (ref 0–37)
Alkaline Phosphatase: 83 U/L (ref 39–117)
BUN: 13 mg/dL (ref 6–23)
CALCIUM: 8.6 mg/dL (ref 8.4–10.5)
CHLORIDE: 103 meq/L (ref 96–112)
CO2: 30 mEq/L (ref 19–32)
Creatinine, Ser: 0.82 mg/dL (ref 0.40–1.20)
GFR: 92.57 mL/min (ref >60.00)
Glucose, Bld: 87 mg/dL (ref 70–99)
Potassium: 3.9 mEq/L (ref 3.5–5.1)
Sodium: 139 mEq/L (ref 135–145)
Total Bilirubin: 0.5 mg/dL (ref 0.2–1.2)
Total Protein: 7.2 g/dL (ref 6.0–8.3)

## 2017-08-07 LAB — LIPID PANEL
CHOLESTEROL: 170 mg/dL (ref 0–200)
HDL: 70.1 mg/dL (ref >39.00)
LDL CALC: 93 mg/dL (ref 0–99)
NonHDL: 100.04
TRIGLYCERIDES: 37 mg/dL (ref 0.0–149.0)
Total CHOL/HDL Ratio: 2
VLDL: 7.4 mg/dL (ref 0.0–40.0)

## 2017-08-07 LAB — TSH: TSH: 2.89 u[IU]/mL (ref 0.35–4.50)

## 2017-08-07 LAB — HEMOGLOBIN A1C: Hgb A1c MFr Bld: 5.9 % (ref 4.6–6.5)

## 2017-08-07 LAB — MICROALBUMIN / CREATININE URINE RATIO
Creatinine,U: 170.4 mg/dL
Microalb Creat Ratio: 0.5 mg/g (ref 0.0–30.0)
Microalb, Ur: 0.8 mg/dL (ref 0.0–1.9)

## 2017-08-12 DIAGNOSIS — Z719 Counseling, unspecified: Secondary | ICD-10-CM | POA: Diagnosis not present

## 2017-08-12 DIAGNOSIS — H16212 Exposure keratoconjunctivitis, left eye: Secondary | ICD-10-CM | POA: Diagnosis not present

## 2017-08-17 ENCOUNTER — Telehealth: Payer: Self-pay | Admitting: *Deleted

## 2017-08-17 ENCOUNTER — Encounter: Payer: Self-pay | Admitting: Family Medicine

## 2017-08-17 NOTE — Telephone Encounter (Signed)
Advised patient per Dr. Etter Sjogren to see podiatrist for her foot pain.

## 2017-08-17 NOTE — Telephone Encounter (Signed)
Copied from [place] [phone]. Topic: Inquiry >> Aug 17, 2017 10:40 AM Aurelio Brash B wrote: Reason for CRM: PT is asking if Dr Lawson Radar wants her to see a specialist  for rt pain since she is a diabetic or should she continue seeing Dr Paulla Dolly (her foot Dr) who was giving her injections

## 2017-08-19 DIAGNOSIS — Z719 Counseling, unspecified: Secondary | ICD-10-CM | POA: Diagnosis not present

## 2017-12-28 ENCOUNTER — Telehealth: Payer: Self-pay | Admitting: Family Medicine

## 2017-12-28 DIAGNOSIS — K219 Gastro-esophageal reflux disease without esophagitis: Secondary | ICD-10-CM

## 2017-12-28 MED ORDER — PANTOPRAZOLE SODIUM 40 MG PO TBEC: 80.0000 mg | DELAYED_RELEASE_TABLET | Freq: Every day | ORAL | 0 refills | Status: DC

## 2017-12-28 NOTE — Telephone Encounter (Signed)
Copied from [place]. Topic: Quick Communication - Rx Refill/Question >> Dec 28, 2017  4:18 PM Sheran Luz wrote: Medication: pantoprazole (PROTONIX) 40 MG tablet [[id number]]    Preferred Pharmacy (with phone number or street name): [place], [place] [phone] (Phone) [phone] (Fax)    Agent: Please be advised that RX refills may take up to 3 business days. We ask that you follow-up with your pharmacy.

## 2017-12-28 NOTE — Telephone Encounter (Signed)
Refill sent to [facility] as requested. Medication was previously filled at [facility]  [address]

## 2018-02-16 ENCOUNTER — Ambulatory Visit: Payer: 59 | Admitting: Family Medicine

## 2018-02-25 ENCOUNTER — Ambulatory Visit: Payer: 59 | Admitting: Family Medicine

## 2018-03-02 ENCOUNTER — Encounter: Payer: Self-pay | Admitting: Family Medicine

## 2018-03-02 ENCOUNTER — Ambulatory Visit (INDEPENDENT_AMBULATORY_CARE_PROVIDER_SITE_OTHER): Payer: 59 | Admitting: Family Medicine

## 2018-03-02 VITALS — BP 153/64 | HR 54 | Temp 98.6°F | Resp 16 | Ht 66.0 in | Wt 339.0 lb

## 2018-03-02 DIAGNOSIS — R600 Localized edema: Secondary | ICD-10-CM

## 2018-03-02 DIAGNOSIS — I10 Essential (primary) hypertension: Secondary | ICD-10-CM | POA: Diagnosis not present

## 2018-03-02 DIAGNOSIS — E785 Hyperlipidemia, unspecified: Secondary | ICD-10-CM

## 2018-03-02 DIAGNOSIS — Z23 Encounter for immunization: Secondary | ICD-10-CM | POA: Diagnosis not present

## 2018-03-02 DIAGNOSIS — Z9884 Bariatric surgery status: Secondary | ICD-10-CM

## 2018-03-02 DIAGNOSIS — E1151 Type 2 diabetes mellitus with diabetic peripheral angiopathy without gangrene: Secondary | ICD-10-CM

## 2018-03-02 DIAGNOSIS — K219 Gastro-esophageal reflux disease without esophagitis: Secondary | ICD-10-CM

## 2018-03-02 DIAGNOSIS — M5441 Lumbago with sciatica, right side: Secondary | ICD-10-CM

## 2018-03-02 MED ORDER — PANTOPRAZOLE SODIUM 40 MG PO TBEC: 80.0000 mg | DELAYED_RELEASE_TABLET | Freq: Every day | ORAL | 1 refills | Status: DC

## 2018-03-02 MED ORDER — PANTOPRAZOLE SODIUM 40 MG PO TBEC: 80.0000 mg | DELAYED_RELEASE_TABLET | Freq: Every day | ORAL | 3 refills | Status: DC

## 2018-03-02 MED ORDER — NONFORMULARY OR COMPOUNDED ITEM: 1 refills | Status: DC

## 2018-03-02 NOTE — Patient Instructions (Signed)

## 2018-03-02 NOTE — Addendum Note (Signed)
Addended by: Harl Bowie on: 03/02/2018 05:25 PM   Modules accepted: Orders

## 2018-03-02 NOTE — Progress Notes (Signed)
Patient ID: Tammy [name], female    [name]: 15-Feb-1961  Age: 57 y.o. MRN: [id number]    Subjective:  Subjective  HPI Tammy [name] presents for f/u dm, chol and bp.   HYPERTENSION   Blood pressure range-not checking   Chest pain- no      Dyspnea- no Lightheadedness- no   Edema- yes Other side effects - no   Medication compliance: good Low salt diet- yes    DIABETES    Blood Sugar ranges-92-132  Polyuria- no New Visual problems- no  Hypoglycemic symptoms- no  Other side effects-no Medication compliance - good Last eye exam- 04/2017 Foot exam- podiatry   HYPERLIPIDEMIA  Medication compliance- good RUQ pain- no  Muscle aches- no Other side effects-no     Review of Systems  Constitutional: Negative for appetite change, diaphoresis, fatigue and unexpected weight change.  Eyes: Negative for pain, redness and visual disturbance.  Respiratory: Negative for cough, chest tightness, shortness of breath and wheezing.   Cardiovascular: Negative for chest pain, palpitations and leg swelling.  Endocrine: Negative for cold intolerance, heat intolerance, polydipsia, polyphagia and polyuria.  Genitourinary: Negative for difficulty urinating, dysuria and frequency.  Musculoskeletal: Positive for back pain.  Neurological: Negative for dizziness, light-headedness, numbness and headaches.    History Past Medical History:  Diagnosis Date  . Acute on chronic appendicitis s/p lap appy 06/10/2012 06/10/2012   surgery done 06-10-12  . Arthritis    ankle  . Complication of anesthesia   . Diabetes mellitus    TYPE 2- no meds in several months-was taken off meds -monitoring blood sugar.  Marland Kitchen GERD (gastroesophageal reflux disease)   . History of colon polyps   . Hyperlipidemia   . Hypertension   . Morbid obesity - BMI > 50 12/13/2012  . Pneumonia    none recent  . PONV (postoperative nausea and vomiting)   . Poor venous access    "usually difficult stick"  . Sinusitis     She has a  past surgical history that includes Abdominal hysterectomy; Knee surgery (Right); Upper gastrointestinal endoscopy (04/14/2006); Colonoscopy (04/11/11); laparoscopic appendectomy (06/10/2012); Appendectomy; Gastric Roux-En-Y (N/A, 02/27/2015); and Colonoscopy with propofol (N/A, 05/11/2017).   Her family history includes Breast cancer in her paternal aunt and paternal aunt; Cancer (age of onset: 87) in her mother; Diabetes in her father and paternal grandmother; Heart failure in her father; Hyperlipidemia in her brother and mother; Hypertension in her mother.She reports that she has never smoked. She has never used smokeless tobacco. She reports that she does not drink alcohol or use drugs.  Current Outpatient Medications on File Prior to Visit  Medication Sig Dispense Refill  . calcium carbonate (TUMS - DOSED IN MG ELEMENTAL CALCIUM) 500 MG chewable tablet Chew 1 tablet 2 (two) times daily by mouth.    . Cyanocobalamin (VITAMIN B-12 SL) Place 1 drop under the tongue daily. 1 dropperful    . furosemide (LASIX) 40 MG tablet TAKE 1 TABLET BY MOUTH  DAILY 90 tablet 3  . glucose blood test strip Check blood sugar twice daily 100 each 11  . ibuprofen (ADVIL,MOTRIN) 200 MG tablet Take 400 mg by mouth every 8 (eight) hours as needed (for pain).    Marland Kitchen loratadine (CLARITIN) 10 MG tablet Take 1 tablet (10 mg total) by mouth daily. 90 tablet 3  . Loteprednol Etabonate (LOTEMAX) 0.5 % GEL Place 1 drop into both eyes 3 (three) times daily as needed (for dry eyes.).    Marland Kitchen Multiple Vitamins-Minerals (  MULTIVITAMIN ADULT PO) Take 1 tablet daily by mouth. flintstone vitamins    . potassium chloride (KLOR-CON) 20 MEQ packet Take 20 mEq by mouth daily. (Patient taking differently: Take 20 mEq by mouth every Wednesday. ) 30 packet 5   No current facility-administered medications on file prior to visit.      Objective:  Objective  Physical Exam  Constitutional: She is oriented to person, place, and time. She appears  well-developed and well-nourished.  HENT:  Head: Normocephalic and atraumatic.  Eyes: Conjunctivae and EOM are normal.  Neck: Normal range of motion. Neck supple. No JVD present. Carotid bruit is not present. No thyromegaly present.  Cardiovascular: Normal rate, regular rhythm and normal heart sounds.  No murmur heard. Pulmonary/Chest: Effort normal and breath sounds normal. No respiratory distress. She has no wheezes. She has no rales. She exhibits no tenderness.  Musculoskeletal: She exhibits edema.  Neurological: She is alert and oriented to person, place, and time.  Psychiatric: She has a normal mood and affect.  Nursing note and vitals reviewed.  BP (!) 153/64 (BP Location: Right Arm, Cuff Size: Normal)   Pulse (!) 54   Temp 98.6 F (37 C) (Oral)   Resp 16   Ht 5\' 6"  (1.676 m)   Wt (!) 339 lb (153.8 kg)   SpO2 100%   BMI 54.72 kg/m  Wt Readings from Last 3 Encounters:  03/02/18 (!) 339 lb (153.8 kg)  08/04/17 (!) 329 lb (149.2 kg)  05/11/17 (!) 310 lb (140.6 kg)     Lab Results  Component Value Date   WBC 3.3 (L) 08/07/2017   HGB 12.7 08/07/2017   HCT 38.1 08/07/2017   PLT 242.0 08/07/2017   GLUCOSE 87 08/07/2017   CHOL 170 08/07/2017   TRIG 37.0 08/07/2017   HDL 70.10 08/07/2017   LDLDIRECT 129.1 10/17/2009   LDLCALC 93 08/07/2017   ALT 17 08/07/2017   AST 19 08/07/2017   NA 139 08/07/2017   K 3.9 08/07/2017   CL 103 08/07/2017   CREATININE 0.82 08/07/2017   BUN 13 08/07/2017   CO2 30 08/07/2017   TSH 2.89 08/07/2017   INR 1.1 (H) 10/16/2014   HGBA1C 5.9 08/07/2017   MICROALBUR 0.8 08/07/2017    Mm Digital Screening Bilateral  Result Date: 04/29/2017 CLINICAL DATA:  Screening. EXAM: DIGITAL SCREENING BILATERAL MAMMOGRAM WITH CAD COMPARISON:  Previous exam(s). ACR Breast Density Category b: There are scattered areas of fibroglandular density. FINDINGS: There are no findings suspicious for malignancy. Images were processed with CAD. IMPRESSION: No  mammographic evidence of malignancy. A result letter of this screening mammogram will be mailed directly to the patient. RECOMMENDATION: Screening mammogram in one year. (Code:SM-B-01Y) BI-RADS CATEGORY  1: Negative. Electronically Signed   By: Dorise Bullion III M.D   On: 04/29/2017 16:26     Assessment & Plan:  Plan  I have discontinued Ledia P. Fuster's NONFORMULARY OR COMPOUNDED ITEM, amLODipine, fluticasone, and NONFORMULARY OR COMPOUNDED ITEM. I am also having her start on NONFORMULARY OR COMPOUNDED ITEM. Additionally, I am having her maintain her potassium chloride, glucose blood, calcium carbonate, Multiple Vitamins-Minerals (MULTIVITAMIN ADULT PO), furosemide, Cyanocobalamin (VITAMIN B-12 SL), Loteprednol Etabonate, ibuprofen, loratadine, and pantoprazole.  Meds ordered this encounter  Medications  . DISCONTD: pantoprazole (PROTONIX) 40 MG tablet    Sig: Take 2 tablets (80 mg total) by mouth daily.    Dispense:  180 tablet    Refill:  1  . NONFORMULARY OR COMPOUNDED ITEM    Sig: Thigh  high compression socks  20-30 mmhg    Re- low ext edema    Dispense:  1 each    Refill:  1  . pantoprazole (PROTONIX) 40 MG tablet    Sig: Take 2 tablets (80 mg total) by mouth daily.    Dispense:  180 tablet    Refill:  3    Problem List Items Addressed This Visit      Unprioritized   DM (diabetes mellitus) type II, controlled, with peripheral vascular disorder ([place])    hgba1c to be checked -- minimize simple carbs. Increase exercise as tolerated. Continue current meds       Essential hypertension - Primary    Well controlled, no changes to meds. Encouraged heart healthy diet such as the DASH diet and exercise as tolerated.  Repeat bp good today      Relevant Orders   Hemoglobin A1c   Comprehensive metabolic panel   Lipid panel   GERD   Relevant Medications   pantoprazole (PROTONIX) 40 MG tablet   Hyperlipidemia LDL goal <70    Encouraged heart healthy diet, increase exercise,  avoid trans fats, consider a krill oil cap daily      Relevant Orders   Hemoglobin A1c   Comprehensive metabolic panel   Lipid panel   Lower extremity edema    prob lymphedema con't compression stockings  con't weight loss      Relevant Medications   NONFORMULARY OR COMPOUNDED ITEM   Morbid obesity - BMI > 50   Relevant Orders   Amb Ref to Medical Weight Management   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    Refer to healthy weight and wellness       Other Visit Diagnoses    Acute right-sided low back pain with right-sided sciatica       Relevant Orders   DG Lumbar Spine Complete   Need for shingles vaccine       Relevant Orders   Varicella-zoster vaccine IM (Shingrix) (Completed)   Influenza vaccine administered       Relevant Orders   Flu Vaccine QUAD 6+ mos PF IM (Fluarix Quad PF) (Completed)      Follow-up: Return in about 6 months (around 09/01/2018), or if symptoms worsen or fail to improve, for hypertension, hyperlipidemia, diabetes II.  [name], DO

## 2018-03-02 NOTE — Assessment & Plan Note (Signed)
-   Refer to healthy weight and wellness 

## 2018-03-02 NOTE — Assessment & Plan Note (Signed)
hgba1c to be checked minimize simple carbs. Increase exercise as tolerated. Continue current meds 

## 2018-03-02 NOTE — Assessment & Plan Note (Signed)
Encouraged heart healthy diet, increase exercise, avoid trans fats, consider a krill oil cap daily 

## 2018-03-02 NOTE — Assessment & Plan Note (Signed)
prob lymphedema con't compression stockings  con't weight loss

## 2018-03-02 NOTE — Assessment & Plan Note (Signed)
Well controlled, no changes to meds. Encouraged heart healthy diet such as the DASH diet and exercise as tolerated.  Repeat bp good today

## 2018-03-10 ENCOUNTER — Other Ambulatory Visit (INDEPENDENT_AMBULATORY_CARE_PROVIDER_SITE_OTHER): Payer: 59

## 2018-03-10 ENCOUNTER — Other Ambulatory Visit: Payer: 59

## 2018-03-10 DIAGNOSIS — I10 Essential (primary) hypertension: Secondary | ICD-10-CM | POA: Diagnosis not present

## 2018-03-10 DIAGNOSIS — E785 Hyperlipidemia, unspecified: Secondary | ICD-10-CM | POA: Diagnosis not present

## 2018-03-10 LAB — COMPREHENSIVE METABOLIC PANEL
ALBUMIN: 3.9 g/dL (ref 3.5–5.2)
ALK PHOS: 92 U/L (ref 39–117)
ALT: 19 U/L (ref 0–35)
AST: 21 U/L (ref 0–37)
BILIRUBIN TOTAL: 0.5 mg/dL (ref 0.2–1.2)
BUN: 17 mg/dL (ref 6–23)
CALCIUM: 8.9 mg/dL (ref 8.4–10.5)
CO2: 29 mEq/L (ref 19–32)
CREATININE: 0.92 mg/dL (ref 0.40–1.20)
Chloride: 102 mEq/L (ref 96–112)
GFR: 80.89 mL/min (ref >60.00)
Glucose, Bld: 97 mg/dL (ref 70–99)
Potassium: 4.1 mEq/L (ref 3.5–5.1)
Sodium: 139 mEq/L (ref 135–145)
TOTAL PROTEIN: 7.1 g/dL (ref 6.0–8.3)

## 2018-03-10 LAB — LIPID PANEL
Cholesterol: 181 mg/dL (ref 0–200)
HDL: 61.7 mg/dL (ref >39.00)
LDL Cholesterol: 109 mg/dL — ABNORMAL HIGH (ref 0–99)
NonHDL: 119.72
Total CHOL/HDL Ratio: 3
Triglycerides: 52 mg/dL (ref 0.0–149.0)
VLDL: 10.4 mg/dL (ref 0.0–40.0)

## 2018-03-10 LAB — HEMOGLOBIN A1C: Hgb A1c MFr Bld: 6.1 % (ref 4.6–6.5)

## 2018-03-19 ENCOUNTER — Ambulatory Visit (INDEPENDENT_AMBULATORY_CARE_PROVIDER_SITE_OTHER)
Admission: RE | Admit: 2018-03-19 | Discharge: 2018-03-19 | Disposition: A | Discharge status: Home / Self Care | Payer: 59 | Source: Ambulatory Visit | Attending: Family Medicine | Admitting: Family Medicine

## 2018-03-19 DIAGNOSIS — M545 Low back pain: Secondary | ICD-10-CM | POA: Diagnosis not present

## 2018-03-19 DIAGNOSIS — M5441 Lumbago with sciatica, right side: Secondary | ICD-10-CM

## 2018-03-25 ENCOUNTER — Telehealth: Payer: Self-pay | Admitting: *Deleted

## 2018-03-25 NOTE — Telephone Encounter (Signed)
Copied from [place] #[id number]. Topic: General - Inquiry >> Mar 25, 2018  8:49 AM Judyann Munson wrote: Reason for CRM: patient is calling to state she is needing a call back from [facility] nurse in regards to her X-ray results. The patient stated she is off work today and if she can be schedule to be seen because her back pain is not changing. Please Advise

## 2018-03-25 NOTE — Telephone Encounter (Signed)
Author phoned pt. To review lumbar xray results and Dr. Nonda Lou recommendation for PT. Pt. states legs from knees and above are stiff now. Pt. states she is taking motrin occasionally, and author encouraged her to take it more regularly, as much as tid per order in the meantime and follow-up with Dr. Etter Sjogren in the office. Appointment made for 11/25 at 330PM. Pt. declined PT referral at this time.

## 2018-03-29 ENCOUNTER — Ambulatory Visit: Payer: 59 | Admitting: Family Medicine

## 2018-03-29 ENCOUNTER — Encounter: Payer: Self-pay | Admitting: Family Medicine

## 2018-03-29 VITALS — BP 123/59 | HR 65 | Temp 98.7°F | Resp 16 | Ht 66.0 in | Wt 338.4 lb

## 2018-03-29 DIAGNOSIS — M25562 Pain in left knee: Secondary | ICD-10-CM | POA: Diagnosis not present

## 2018-03-29 DIAGNOSIS — M25561 Pain in right knee: Secondary | ICD-10-CM

## 2018-03-29 NOTE — Progress Notes (Signed)
Patient ID: Tammy [name], female    [name]: 13-Apr-1961  Age: 57 y.o. MRN: [id number]    Subjective:  Subjective  HPI kne Song P Florio presents for b/l knee pain.  The L knee popped last Thursday.  Pain in both knees over patella.  No known injury But standing and walking are painful  Review of Systems  Constitutional: Negative for appetite change, diaphoresis, fatigue and unexpected weight change.  Eyes: Negative for pain, redness and visual disturbance.  Respiratory: Negative for cough, chest tightness, shortness of breath and wheezing.   Cardiovascular: Negative for chest pain, palpitations and leg swelling.  Endocrine: Negative for cold intolerance, heat intolerance, polydipsia, polyphagia and polyuria.  Genitourinary: Negative for difficulty urinating, dysuria and frequency.  Musculoskeletal: Positive for arthralgias and gait problem.  Neurological: Negative for dizziness, light-headedness, numbness and headaches.    History Past Medical History:  Diagnosis Date  . Acute on chronic appendicitis s/p lap appy 06/10/2012 06/10/2012   surgery done 06-10-12  . Arthritis    ankle  . Complication of anesthesia   . Diabetes mellitus    TYPE 2- no meds in several months-was taken off meds -monitoring blood sugar.  Marland Kitchen GERD (gastroesophageal reflux disease)   . History of colon polyps   . Hyperlipidemia   . Hypertension   . Morbid obesity - BMI > 50 12/13/2012  . Pneumonia    none recent  . PONV (postoperative nausea and vomiting)   . Poor venous access    "usually difficult stick"  . Sinusitis     She has a past surgical history that includes Abdominal hysterectomy; Knee surgery (Right); Upper gastrointestinal endoscopy (04/14/2006); Colonoscopy (04/11/11); laparoscopic appendectomy (06/10/2012); Appendectomy; Gastric Roux-En-Y (N/A, 02/27/2015); and Colonoscopy with propofol (N/A, 05/11/2017).   Her family history includes Breast cancer in her paternal aunt and paternal aunt; Cancer  (age of onset: 36) in her mother; Diabetes in her father and paternal grandmother; Heart failure in her father; Hyperlipidemia in her brother and mother; Hypertension in her mother.She reports that she has never smoked. She has never used smokeless tobacco. She reports that she does not drink alcohol or use drugs.  Current Outpatient Medications on File Prior to Visit  Medication Sig Dispense Refill  . calcium carbonate (TUMS - DOSED IN MG ELEMENTAL CALCIUM) 500 MG chewable tablet Chew 1 tablet 2 (two) times daily by mouth.    . Cyanocobalamin (VITAMIN B-12 SL) Place 1 drop under the tongue daily. 1 dropperful    . furosemide (LASIX) 40 MG tablet TAKE 1 TABLET BY MOUTH  DAILY 90 tablet 3  . glucose blood test strip Check blood sugar twice daily 100 each 11  . ibuprofen (ADVIL,MOTRIN) 200 MG tablet Take 400 mg by mouth every 8 (eight) hours as needed (for pain).    Marland Kitchen loratadine (CLARITIN) 10 MG tablet Take 1 tablet (10 mg total) by mouth daily. 90 tablet 3  . Loteprednol Etabonate (LOTEMAX) 0.5 % GEL Place 1 drop into both eyes 3 (three) times daily as needed (for dry eyes.).    Marland Kitchen Multiple Vitamins-Minerals (MULTIVITAMIN ADULT PO) Take 1 tablet daily by mouth. flintstone vitamins    . NONFORMULARY OR COMPOUNDED ITEM Thigh high compression socks  20-30 mmhg    Re- low ext edema 1 each 1  . pantoprazole (PROTONIX) 40 MG tablet Take 2 tablets (80 mg total) by mouth daily. 180 tablet 3  . potassium chloride (KLOR-CON) 20 MEQ packet Take 20 mEq by mouth daily. (Patient taking differently:  Take 20 mEq by mouth every Wednesday. ) 30 packet 5   No current facility-administered medications on file prior to visit.      Objective:  Objective  Physical Exam  Constitutional: She is oriented to person, place, and time. She appears well-developed and well-nourished.  HENT:  Head: Normocephalic and atraumatic.  Eyes: Conjunctivae and EOM are normal.  Neck: Normal range of motion. Neck supple. No JVD  present. Carotid bruit is not present. No thyromegaly present.  Cardiovascular: Normal rate, regular rhythm and normal heart sounds.  No murmur heard. Pulmonary/Chest: Effort normal and breath sounds normal. No respiratory distress. She has no wheezes. She has no rales. She exhibits no tenderness.  Musculoskeletal: She exhibits edema and tenderness.       Right knee: Tenderness found. Patellar tendon tenderness noted.       Left knee: She exhibits swelling. Tenderness found. Patellar tendon tenderness noted.  Neurological: She is alert and oriented to person, place, and time.  Psychiatric: She has a normal mood and affect.  Nursing note and vitals reviewed.  BP (!) 123/59   Pulse 65   Temp 98.7 F (37.1 C) (Oral)   Resp 16   Ht 5\' 6"  (1.676 m)   Wt (!) 338 lb 6.4 oz (153.5 kg)   SpO2 100%   BMI 54.62 kg/m  Wt Readings from Last 3 Encounters:  03/29/18 (!) 338 lb 6.4 oz (153.5 kg)  03/02/18 (!) 339 lb (153.8 kg)  08/04/17 (!) 329 lb (149.2 kg)     Lab Results  Component Value Date   WBC 3.3 (L) 08/07/2017   HGB 12.7 08/07/2017   HCT 38.1 08/07/2017   PLT 242.0 08/07/2017   GLUCOSE 97 03/10/2018   CHOL 181 03/10/2018   TRIG 52.0 03/10/2018   HDL 61.70 03/10/2018   LDLDIRECT 129.1 10/17/2009   LDLCALC 109 (H) 03/10/2018   ALT 19 03/10/2018   AST 21 03/10/2018   NA 139 03/10/2018   K 4.1 03/10/2018   CL 102 03/10/2018   CREATININE 0.92 03/10/2018   BUN 17 03/10/2018   CO2 29 03/10/2018   TSH 2.89 08/07/2017   INR 1.1 (H) 10/16/2014   HGBA1C 6.1 03/10/2018   MICROALBUR 0.8 08/07/2017    Dg Lumbar Spine Complete  Result Date: 03/20/2018 CLINICAL DATA:  Low back pain history of fall EXAM: LUMBAR SPINE - COMPLETE 4+ VIEW COMPARISON:  03/31/2016 FINDINGS: Lumbar alignment is within normal limits. The vertebral body heights are maintained. Mild degenerative changes at L5-S1. Small anterior osteophytes at most levels of the lumbar spine. IMPRESSION: Mild degenerative  changes. No acute osseous abnormality. Electronically Signed   By: Donavan Foil M.D.   On: 03/20/2018 19:10     Assessment & Plan:  Plan  I am having [place] maintain her potassium chloride, glucose blood, calcium carbonate, Multiple Vitamins-Minerals (MULTIVITAMIN ADULT PO), furosemide, Cyanocobalamin (VITAMIN B-12 SL), Loteprednol Etabonate, ibuprofen, loratadine, NONFORMULARY OR COMPOUNDED ITEM, and pantoprazole.  No orders of the defined types were placed in this encounter.   Problem List Items Addressed This Visit    None    Visit Diagnoses    Acute pain of both knees    -  Primary   Relevant Orders   Ambulatory referral to Sports Medicine   DG Knee Complete 4 Views Right   DG Knee Complete 4 Views Right    pt has appointment for sport med tomorrow am Put xray in for [facility] due to pt having high deductible plan  Follow-up: Return if symptoms worsen or fail to improve.  [name], DO

## 2018-03-29 NOTE — Patient Instructions (Signed)

## 2018-03-30 ENCOUNTER — Other Ambulatory Visit: Payer: Self-pay | Admitting: Family Medicine

## 2018-03-30 ENCOUNTER — Encounter: Payer: Self-pay | Admitting: Family Medicine

## 2018-03-30 ENCOUNTER — Ambulatory Visit (INDEPENDENT_AMBULATORY_CARE_PROVIDER_SITE_OTHER): Payer: 59 | Admitting: Family Medicine

## 2018-03-30 DIAGNOSIS — Z1231 Encounter for screening mammogram for malignant neoplasm of breast: Secondary | ICD-10-CM

## 2018-03-30 DIAGNOSIS — M25561 Pain in right knee: Secondary | ICD-10-CM

## 2018-03-30 DIAGNOSIS — M25562 Pain in left knee: Secondary | ICD-10-CM | POA: Diagnosis not present

## 2018-03-30 MED ORDER — METHYLPREDNISOLONE ACETATE 40 MG/ML IJ SUSP
40.0000 mg | Freq: Once | INTRAMUSCULAR | Status: AC
  Administered 2018-03-30: 40 mg via INTRA_ARTICULAR

## 2018-03-30 NOTE — Progress Notes (Signed)
PCP and consultation requested by: Tammy Held, DO  Subjective:   HPI: Patient is a 57 y.o. female here for bilateral knee pain.  Patient has known history of arthritis and reports that she was walking to the store after getting out of the car on November 21 and heard a pop in the anterior aspect of her left knee. No pain immediately but as soon as she was walking around the store she started limping with anterior knee pain that has worsened since then and is now at an 8 out of 10 level, a tightness. She feels like she is developing some compensatory right knee pain that is 5 out of 10 and anterior as well. She is tried ibuprofen and icing with mild benefit. Associated swelling. No skin changes or numbness.  Past Medical History:  Diagnosis Date  . Acute on chronic appendicitis s/p lap appy 06/10/2012 06/10/2012   surgery done 06-10-12  . Arthritis    ankle  . Complication of anesthesia   . Diabetes mellitus    TYPE 2- no meds in several months-was taken off meds -monitoring blood sugar.  Marland Kitchen GERD (gastroesophageal reflux disease)   . History of colon polyps   . Hyperlipidemia   . Hypertension   . Morbid obesity - BMI > 50 12/13/2012  . Pneumonia    none recent  . PONV (postoperative nausea and vomiting)   . Poor venous access    "usually difficult stick"  . Sinusitis     Current Outpatient Medications on File Prior to Visit  Medication Sig Dispense Refill  . calcium carbonate (TUMS - DOSED IN MG ELEMENTAL CALCIUM) 500 MG chewable tablet Chew 1 tablet 2 (two) times daily by mouth.    . Cyanocobalamin (VITAMIN B-12 SL) Place 1 drop under the tongue daily. 1 dropperful    . furosemide (LASIX) 40 MG tablet TAKE 1 TABLET BY MOUTH  DAILY 90 tablet 3  . glucose blood test strip Check blood sugar twice daily 100 each 11  . ibuprofen (ADVIL,MOTRIN) 200 MG tablet Take 400 mg by mouth every 8 (eight) hours as needed (for pain).    Marland Kitchen loratadine (CLARITIN) 10 MG tablet Take 1 tablet  (10 mg total) by mouth daily. 90 tablet 3  . Loteprednol Etabonate (LOTEMAX) 0.5 % GEL Place 1 drop into both eyes 3 (three) times daily as needed (for dry eyes.).    Marland Kitchen Multiple Vitamins-Minerals (MULTIVITAMIN ADULT PO) Take 1 tablet daily by mouth. flintstone vitamins    . NONFORMULARY OR COMPOUNDED ITEM Thigh high compression socks  20-30 mmhg    Re- low ext edema 1 each 1  . pantoprazole (PROTONIX) 40 MG tablet Take 2 tablets (80 mg total) by mouth daily. 180 tablet 3  . potassium chloride (KLOR-CON) 20 MEQ packet Take 20 mEq by mouth daily. (Patient taking differently: Take 20 mEq by mouth every Wednesday. ) 30 packet 5   No current facility-administered medications on file prior to visit.     Past Surgical History:  Procedure Laterality Date  . ABDOMINAL HYSTERECTOMY    . APPENDECTOMY     2'14  . COLONOSCOPY  04/11/11   5 mm polyp removed but not recovered  . COLONOSCOPY WITH PROPOFOL N/A 05/11/2017   Procedure: COLONOSCOPY WITH PROPOFOL;  Surgeon: Gatha Mayer, MD;  Location: [facility];  Service: Endoscopy;  Laterality: N/A;  . GASTRIC ROUX-EN-Y N/A 02/27/2015   Procedure: LAPAROSCOPIC ROUX-EN-Y GASTRIC BYPASS WITH UPPER ENDOSCOPY;  Surgeon: Johnathan Hausen, MD;  Location: [facility];  Service: General;  Laterality: N/A;  . KNEE SURGERY Right     KNEE SURGERY   . LAPAROSCOPIC APPENDECTOMY  06/10/2012   Procedure: APPENDECTOMY LAPAROSCOPIC;  Surgeon: Adin Hector, MD;  Location: [facility];  Service: General;  Laterality: N/A;  . UPPER GASTROINTESTINAL ENDOSCOPY  04/14/2006   Normal    Allergies  Allergen Reactions  . Ace Inhibitors Anaphylaxis    Bowel angioedema  . Cheese Nausea And Vomiting  . Red Dye Nausea And Vomiting  . [name] Flash [Fish Allergy] Nausea And Vomiting    Social History   Socioeconomic History  . Marital status: Married    Spouse name: Not on file  . Number of children: 0  . Years of education: Not on file  . Highest education level: Not on file   Occupational History  . 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[employer]  . Financial resource strain: Not on file  . Food insecurity:    Worry: Not on file    Inability: Not on file  . Transportation needs:    Medical: Not on file    Non-medical: Not on file  Tobacco Use  . Smoking status: Never Smoker  . Smokeless tobacco: Never Used  Substance and Sexual Activity  . Alcohol use: No    Alcohol/week: 0.0 standard drinks  . Drug use: No  . Sexual activity: Yes    Partners: Male  Lifestyle  . Physical activity:    Days per week: Not on file    Minutes per session: Not on file  . Stress: Not on file  Relationships  . Social connections:    Talks on phone: Not on file    Gets together: Not on file    Attends religious service: Not on file    Active member of club or organization: Not on file    Attends meetings of clubs or organizations: Not on file    Relationship status: Not on file  . Intimate partner violence:    Fear of current or ex partner: Not on file    Emotionally abused: Not on file    Physically abused: Not on file    Forced sexual activity: Not on file  Other Topics Concern  . Not on file  Social History Narrative   Exercise- no    Family History  Problem Relation Age of Onset  . Hyperlipidemia Mother   . Hypertension Mother   . Cancer Mother 32       hodgkins lymphoma  . Diabetes Father   . Heart failure Father   . Hyperlipidemia Brother   . Diabetes Paternal Grandmother   . Breast cancer Paternal Aunt        unsure of age  . Breast cancer Paternal Aunt   . Colon cancer Neg Hx   . Heart attack Neg Hx   . Sudden death Neg Hx   . Colon polyps Neg Hx   . Esophageal cancer Neg Hx   . Stomach cancer Neg Hx   . Rectal cancer Neg Hx     BP 134/88   Pulse 68   Ht 5\' 6"  (1.676 m)   Wt (!) 338 lb (153.3 kg)   BMI 54.55 kg/m   Review of Systems: See HPI above.     Objective:  Physical Exam:  Gen: NAD,  comfortable in exam room  Right knee: Difficult to detect effusion due to habitus.  No gross deformity, ecchymoses.  TTP medial and lateral joint lines, post patellar facets ROM 0 - 100 degrees with 5/5 strength flexion and extension. Negative ant/post drawers. Negative valgus/varus testing. Negative lachmans. Negative mcmurrays, apleys, patellar apprehension. NV intact distally.  Left knee: Difficult to detect effusion due to habitus.  No gross deformity, ecchymoses. TTP medial and lateral joint lines, post patellar facets ROM 0 - 100 degrees with 5/5 strength flexion and extension. Negative ant/post drawers. Negative valgus/varus testing. Negative lachmans. Negative mcmurrays, apleys, patellar apprehension. NV intact distally.  MSK u/s:  Patellar tendons intact bilateral knees without abnormalities.  Mild knee effusions bilaterally.   Assessment & Plan:  1. Bilateral knee pain - exam reassuring.  Consistent with flare of known arthritis.  She was given bilateral steroid injections today.  We discussed Tylenol, topical medications, supplements, Aleve.  She was shown home exercises to do daily.  Consider physical therapy.  Icing, compression and elevation.  Follow-up in 1 month.  After informed written consent timeout was performed, patient was seated in chair in exam room. Right knee was prepped with alcohol swab and utilizing superolateral approach with ultrasound guidance, patient's right knee was injected intraarticularly with 3:1 bupivicaine: depomedrol. Patient tolerated the procedure well without immediate complications.  After informed written consent timeout was performed, patient was seated in chair in exam room. Left knee was prepped with alcohol swab and utilizing superolateral approach with ultrasound guidance, patient's left knee was injected intraarticularly with 3:1 bupivicaine: depomedrol. Patient tolerated the procedure well without immediate complications.

## 2018-03-30 NOTE — Patient Instructions (Signed)
Your pain is due to a flare of arthritis These are the different medications you can take for this: Tylenol 500mg  1-2 tabs three times a day for pain. Capsaicin, aspercreme, or biofreeze topically up to four times a day may also help with pain. Some supplements that may help for arthritis: Boswellia extract, curcumin, pycnogenol Aleve 2 tabs twice a day with food - take for 7-10 days then as needed. Cortisone injections are an option - we can do these today if you want. It's important that you continue to stay active. Straight leg raises, knee extensions 3 sets of 10 once a day (add ankle weight if these become too easy). Consider physical therapy to strengthen muscles around the joint that hurts to take pressure off of the joint itself. Shoe inserts with good arch support may be helpful. Ice 15 minutes at a time 3-4 times a day as needed to help with pain. Try your knee brace - if this isn't helping an ACE wrap, knee sleeve may help with the pain and swelling. Follow up with me in 1 month but call me sooner if you're not improving.

## 2018-04-06 ENCOUNTER — Ambulatory Visit: Payer: Self-pay | Admitting: *Deleted

## 2018-04-06 DIAGNOSIS — R42 Dizziness and giddiness: Secondary | ICD-10-CM | POA: Diagnosis not present

## 2018-04-06 DIAGNOSIS — I10 Essential (primary) hypertension: Secondary | ICD-10-CM | POA: Diagnosis not present

## 2018-04-06 DIAGNOSIS — R112 Nausea with vomiting, unspecified: Secondary | ICD-10-CM | POA: Diagnosis not present

## 2018-04-06 NOTE — Telephone Encounter (Signed)
Patient is calling with elevated BP- she has headache, vomiting and dizziness. Earlier today- 188/87 and 184/74. It did come down to 140/67- but has gone back up to 166/82 now. Advised ED for treatment and evaluation.  Reason for Disposition . [1] Systolic BP  >= 610 OR Diastolic >= 960 AND [4] cardiac or neurologic symptoms (e.g., chest pain, difficulty breathing, unsteady gait, blurred vision)  Answer Assessment - Initial Assessment Questions 1. BLOOD PRESSURE: "What is the blood pressure?" "Did you take at least two measurements 5 minutes apart?"     166/82, 161/74 P 54 2. ONSET: "When did you take your blood pressure?"     11:15, 11:45 3. HOW: "How did you obtain the blood pressure?" (e.g., visiting nurse, automatic home BP monitor)     Automatic cuff- arm 4. HISTORY: "Do you have a history of high blood pressure?"     Yes- with sinus headache or sinus pressure, headache 5. MEDICATIONS: "Are you taking any medications for blood pressure?" "Have you missed any doses recently?"     no 6. OTHER SYMPTOMS: "Do you have any symptoms?" (e.g., headache, chest pain, blurred vision, difficulty breathing, weakness)     Headache, nausea/vomiting, dizzy, panting if walking around 7. PREGNANCY: "Is there any chance you are pregnant?" "When was your last menstrual period?"     n/a  Protocols used: HIGH BLOOD PRESSURE-A-AH

## 2018-04-07 ENCOUNTER — Encounter: Payer: Self-pay | Admitting: Internal Medicine

## 2018-04-07 ENCOUNTER — Ambulatory Visit (INDEPENDENT_AMBULATORY_CARE_PROVIDER_SITE_OTHER): Payer: 59 | Admitting: Internal Medicine

## 2018-04-07 ENCOUNTER — Ambulatory Visit: Payer: Self-pay | Admitting: *Deleted

## 2018-04-07 VITALS — BP 178/98 | HR 51 | Temp 98.1°F | Resp 16 | Ht 66.0 in | Wt 341.1 lb

## 2018-04-07 DIAGNOSIS — R110 Nausea: Secondary | ICD-10-CM | POA: Diagnosis not present

## 2018-04-07 DIAGNOSIS — R51 Headache: Secondary | ICD-10-CM | POA: Diagnosis not present

## 2018-04-07 DIAGNOSIS — R519 Headache, unspecified: Secondary | ICD-10-CM

## 2018-04-07 DIAGNOSIS — I10 Essential (primary) hypertension: Secondary | ICD-10-CM | POA: Diagnosis not present

## 2018-04-07 MED ORDER — CARVEDILOL 12.5 MG PO TABS: 12.5000 mg | ORAL_TABLET | Freq: Two times a day (BID) | ORAL | 3 refills | Status: DC

## 2018-04-07 NOTE — Telephone Encounter (Signed)
Author spoke with Dr. Larose Kells, who agreed to add pt. onto his schedule at [facility]. Author phoned pt. to notify. Pt. Stated she feels better, but BP still high. Agreed to be seen by Dr. Larose Kells, appreciative. Appointment made.

## 2018-04-07 NOTE — Telephone Encounter (Signed)
Noted  

## 2018-04-07 NOTE — Progress Notes (Signed)
Subjective:    Patient ID: Tammy [name], female    [name]: 06-30-1960, 57 y.o.   MRN: [id number]  DOS:  04/07/2018 Type of visit - description :  Acute visit. She went to an outside ER yesterday with elevated blood pressure, nausea, vomiting, diarrhea, headache and dizziness. Prior to the ER arrival BP at home was 166/80. At the ER BP was 173/87. She got clonidine and Zofran. CMP reviewed, potassium 3.5, creatinine 0.76, calcium 8.3.  Sodium 138.  Since she left the ER she is feeling better. Nausea and vomiting are resolved. She still feels slightly lightheaded She still has a mild headache, mostly at the forehead, she thinks related to sinuses however she denies any runny nose or nasal discharge.  BP Readings from Last 3 Encounters:  04/07/18 (!) 178/98  03/30/18 134/88  03/29/18 (!) 123/59    Review of Systems  Denies fevers, had some chills. No chest pain no difficulty breathing.  No edema Denies any nosebleeds. Denies recent use of NSAIDs or decongestants Denies symptoms such as slurred speech, diplopia or facial numbness.  Past Medical History:  Diagnosis Date  . Acute on chronic appendicitis s/p lap appy 06/10/2012 06/10/2012   surgery done 06-10-12  . Arthritis    ankle  . Complication of anesthesia   . Diabetes mellitus    TYPE 2- no meds in several months-was taken off meds -monitoring blood sugar.  Marland Kitchen GERD (gastroesophageal reflux disease)   . History of colon polyps   . Hyperlipidemia   . Hypertension   . Morbid obesity - BMI > 50 12/13/2012  . Pneumonia    none recent  . PONV (postoperative nausea and vomiting)   . Poor venous access    "usually difficult stick"  . Sinusitis     Past Surgical History:  Procedure Laterality Date  . ABDOMINAL HYSTERECTOMY    . APPENDECTOMY     2'14  . COLONOSCOPY  04/11/11   5 mm polyp removed but not recovered  . COLONOSCOPY WITH PROPOFOL N/A 05/11/2017   Procedure: COLONOSCOPY WITH PROPOFOL;  Surgeon: Gatha Mayer,  MD;  Location: [facility];  Service: Endoscopy;  Laterality: N/A;  . GASTRIC ROUX-EN-Y N/A 02/27/2015   Procedure: LAPAROSCOPIC ROUX-EN-Y GASTRIC BYPASS WITH UPPER ENDOSCOPY;  Surgeon: Johnathan Hausen, MD;  Location: [facility];  Service: General;  Laterality: N/A;  . KNEE SURGERY Right     KNEE SURGERY   . LAPAROSCOPIC APPENDECTOMY  06/10/2012   Procedure: APPENDECTOMY LAPAROSCOPIC;  Surgeon: Adin Hector, MD;  Location: [facility];  Service: General;  Laterality: N/A;  . UPPER GASTROINTESTINAL ENDOSCOPY  04/14/2006   Normal    Social History   Socioeconomic History  . Marital status: Married    Spouse name: Not on file  . Number of children: 0  . Years of education: Not on file  . Highest education level: Not on file  Occupational History  . 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[employer]  . Financial resource strain: Not on file  . Food insecurity:    Worry: Not on file    Inability: Not on file  . Transportation needs:    Medical: Not on file    Non-medical: Not on file  Tobacco Use  . Smoking status: Never Smoker  . Smokeless tobacco: Never Used  Substance and Sexual Activity  . Alcohol use: No    Alcohol/week: 0.0 standard drinks  . Drug use: No  . Sexual  activity: Yes    Partners: Male  Lifestyle  . Physical activity:    Days per week: Not on file    Minutes per session: Not on file  . Stress: Not on file  Relationships  . Social connections:    Talks on phone: Not on file    Gets together: Not on file    Attends religious service: Not on file    Active member of club or organization: Not on file    Attends meetings of clubs or organizations: Not on file    Relationship status: Not on file  . Intimate partner violence:    Fear of current or ex partner: Not on file    Emotionally abused: Not on file    Physically abused: Not on file    Forced sexual activity: Not on file  Other Topics Concern  . Not on file  Social History  Narrative   Exercise- no      Allergies as of 04/07/2018      Reactions   Ace Inhibitors Anaphylaxis   Bowel angioedema   Cheese Nausea And Vomiting   Red Dye Nausea And Vomiting   Tuna [fish Allergy] Nausea And Vomiting      Medication List        Accurate as of 04/07/18 11:59 PM. Always use your most recent med list.          calcium carbonate 500 MG chewable tablet Commonly known as:  TUMS - dosed in mg elemental calcium Chew 1 tablet 2 (two) times daily by mouth.   carvedilol 12.5 MG tablet Commonly known as:  COREG Take 1 tablet (12.5 mg total) by mouth 2 (two) times daily with a meal.   furosemide 40 MG tablet Commonly known as:  LASIX TAKE 1 TABLET BY MOUTH  DAILY   glucose blood test strip Check blood sugar twice daily   ibuprofen 200 MG tablet Commonly known as:  ADVIL,MOTRIN Take 400 mg by mouth every 8 (eight) hours as needed (for pain).   loratadine 10 MG tablet Commonly known as:  CLARITIN Take 1 tablet (10 mg total) by mouth daily.   LOTEMAX 0.5 % Gel Generic drug:  Loteprednol Etabonate Place 1 drop into both eyes 3 (three) times daily as needed (for dry eyes.).   MULTIVITAMIN ADULT PO Take 1 tablet daily by mouth. flintstone vitamins   NONFORMULARY OR COMPOUNDED ITEM Thigh high compression socks  20-30 mmhg    Re- low ext edema   ondansetron 4 MG disintegrating tablet Commonly known as:  ZOFRAN-ODT Take 1 tablet by mouth every 8 (eight) hours as needed for nausea/vomiting.   pantoprazole 40 MG tablet Commonly known as:  PROTONIX Take 2 tablets (80 mg total) by mouth daily.   VITAMIN B-12 SL Place 1 drop under the tongue daily. 1 dropperful           Objective:   Physical Exam BP (!) 178/98 (BP Location: Left Arm, Patient Position: Sitting, Cuff Size: Normal)   Pulse (!) 51   Temp 98.1 F (36.7 C) (Oral)   Resp 16   Ht 5\' 6"  (1.676 m)   Wt (!) 341 lb 2 oz (154.7 kg)   SpO2 98%   BMI 55.06 kg/m  General:   Well developed,  NAD, BMI noted. HEENT:  Normocephalic . Face symmetric, atraumatic Lungs:  CTA B Normal respiratory effort, no intercostal retractions, no accessory muscle use. Heart: RRR,  no murmur.  No pretibial edema bilaterally  Skin: Not  pale. Not jaundice Neurologic:  alert & oriented X3.  Speech normal, gait somewhat limited by BMI EOMI, pupils equal and reactive. Psych--  Cognition and judgment appear intact.  Cooperative with normal attention span and concentration.  Behavior appropriate. No anxious or depressed appearing.      Assessment & Plan:   56 year old female, PMH includes HTN, DM, GERD, high cholesterol, morbid obesity s/p laparoscopic surgery, presents with  HTN: History of HTN, at some point took medications, after her gastric bypass BP meds were stopped.  She is currently on Lasix (w/o K+supplementation) due to edema. Here BP has been elevated lately, she had an anaphylactic reaction to ACE inhibitors, amlodipine was used before  but with a history of edema I prefer to avoid it at this time.  Heart rate at home usually 74 to the 80s. Plan:  Carvedilol 12.5 twice daily, monitor BPs. Headache: Advised patient that I am somewhat concerned about the headache, recommend a CT, she is very hesitant.  Explained that headache may be related to a spike on her BP creating an acute intracranial problem or could be secondary to simply not feeling well with nausea, vomiting and diarrhea. At the end we agreed to monitor the headache, if she is not back to normal within 48 hours she will let me know.  See AVS Nausea vomiting diarrhea: Resolved. RTC 2 weeks w/ PCP

## 2018-04-07 NOTE — Telephone Encounter (Signed)
Today b/p 186/82. She was seen at [facility] yesterday for high b/p and N/V. At discharge her b/p was 160/88. Sent home with anti-nausea medication. She continues to have a headache and nausea this morning. Denies vision changes/weakness/CP/SOB. She does have facial sinus discomfort and has taken only claritin. Stated she was previously on Norvasc and flonase but was discontinued in [place] by PCP. No availability at practice today or tomorrow. Routing to PCP for further consideration of appointment.  Reason for Disposition . Systolic BP  >= 943 OR Diastolic >= 276  Answer Assessment - Initial Assessment Questions 1. BLOOD PRESSURE: "What is the blood pressure?" "Did you take at least two measurements 5 minutes apart?"     186/82 p. 74 today 2. ONSET: "When did you take your blood pressure?"     Mid morning today 3. HOW: "How did you obtain the blood pressure?" (e.g., visiting nurse, automatic home BP monitor)     Home auto cuff 4. HISTORY: "Do you have a history of high blood pressure?"     Yes.  5. MEDICATIONS: "Are you taking any medications for blood pressure?" "Have you missed any doses recently?"     no 6. OTHER SYMPTOMS: "Do you have any symptoms?" (e.g., headache, chest pain, blurred vision, difficulty breathing, weakness)     Headache and facial sinus pain. 7. PREGNANCY: "Is there any chance you are pregnant?" "When was your last menstrual period?"     no  Protocols used: HIGH BLOOD PRESSURE-A-AH

## 2018-04-07 NOTE — Progress Notes (Signed)
Pre visit review using our clinic review tool, if applicable. No additional management support is needed unless otherwise documented below in the visit note. 

## 2018-04-07 NOTE — Telephone Encounter (Signed)
Seen at [facility] for high b/p, headaches, n/v. Blood work done and b/p 180's. Was given oral b/p medication on site and discharged with a b/p of around 160/88. Sent home with anit-nausea medication.. 186/82 b/p hr 74 bpm today with her home cuff. Headache and nausea again today. She has had facial sinus pain for 3 days. She did take claritin yesterday. Stated she was previously on flonase and norvasc.

## 2018-04-07 NOTE — Patient Instructions (Signed)
  GO TO THE FRONT DESK Schedule your next appointment for a checkup with your primary doctor in 2 weeks  Continue the same medications  Add carvedilol 12.5 mg 1 tablet twice a day for your blood pressure  Use Flonase 2 sprays on each side of the nose daily  If your headaches persist for more than 48 hours, please call or go to the ER.    Check the  blood pressure 2 or 3 times a week Be sure your blood pressure is between 110/65 and  135/85. If it is consistently higher or lower, let me know

## 2018-04-08 ENCOUNTER — Telehealth: Payer: Self-pay | Admitting: Internal Medicine

## 2018-04-08 NOTE — Telephone Encounter (Signed)
Was seen w/ [facility] and elevated BPs. Please check on pt, feeling better? BP? Heart rates?

## 2018-04-09 ENCOUNTER — Telehealth: Payer: Self-pay | Admitting: Family Medicine

## 2018-04-09 DIAGNOSIS — G4452 New daily persistent headache (NDPH): Secondary | ICD-10-CM

## 2018-04-09 MED ORDER — CARVEDILOL 12.5 MG PO TABS: 18.7500 mg | ORAL_TABLET | Freq: Two times a day (BID) | ORAL | 3 refills | Status: DC

## 2018-04-09 NOTE — Telephone Encounter (Signed)
My Chart message sent

## 2018-04-09 NOTE — Telephone Encounter (Signed)
See telephone note 04/09/2018.

## 2018-04-09 NOTE — Telephone Encounter (Signed)
Spoke w/ Pt- informed of recommendations. Pt requesting CT as discussed at [facility] the other day. Okay per Dr. Larose Kells- STAT CT head wo, Pt requests to have completed in [facility]- will order to [facility] imaging.

## 2018-04-09 NOTE — Telephone Encounter (Signed)
Copied from [place] [id number]. Topic: General - Other >> Apr 09, 2018 11:32 AM Lennox Solders wrote: Reason for CRM: Pt saw dr Larose Kells 12/4 and was told to callback with bp reading 12/5 at 11 am 162/85 and at 11:45pm bp 152/82. 12/6  bp 156/87 this morning  before she take her bp med. Please advise

## 2018-04-09 NOTE — Telephone Encounter (Signed)
Advised patient: HTN:  making some progress.  Increase carvedilol 12.5 to: 1.5 tablets twice daily. Needs to continue watching her heart rate, don't need to be less than the 50s. Also needs to call if the HA is not gradually better

## 2018-04-12 ENCOUNTER — Ambulatory Visit
Admission: RE | Admit: 2018-04-12 | Discharge: 2018-04-12 | Disposition: A | Discharge status: Home / Self Care | Payer: 59 | Source: Ambulatory Visit | Attending: Internal Medicine | Admitting: Internal Medicine

## 2018-04-12 DIAGNOSIS — R51 Headache: Secondary | ICD-10-CM | POA: Diagnosis not present

## 2018-04-13 ENCOUNTER — Ambulatory Visit (INDEPENDENT_AMBULATORY_CARE_PROVIDER_SITE_OTHER): Payer: 59 | Admitting: Family Medicine

## 2018-04-13 ENCOUNTER — Encounter: Payer: Self-pay | Admitting: Family Medicine

## 2018-04-13 VITALS — BP 132/80 | HR 65 | Temp 99.6°F | Resp 16 | Ht 66.0 in | Wt 341.6 lb

## 2018-04-13 DIAGNOSIS — J324 Chronic pansinusitis: Secondary | ICD-10-CM

## 2018-04-13 MED ORDER — FLUTICASONE PROPIONATE 50 MCG/ACT NA SUSP: 2.0000 | Freq: Every day | NASAL | 6 refills | Status: DC

## 2018-04-13 MED ORDER — AMOXICILLIN-POT CLAVULANATE 875-125 MG PO TABS: 1.0000 | ORAL_TABLET | Freq: Two times a day (BID) | ORAL | 0 refills | Status: DC

## 2018-04-13 NOTE — Progress Notes (Signed)
Patient ID: Tammy [name], female    [name]: 1961/02/01  Age: 57 y.o. MRN: [id number]    Subjective:  Subjective  HPI SHANTAI TIEDEMAN presents for f/u headache--- headaches is better  But is congested   Pt sinus pressure.  No fever see previous visits.    Review of Systems  Constitutional: Positive for chills. Negative for fever.  HENT: Positive for congestion, postnasal drip, rhinorrhea and sinus pressure.   Respiratory: Positive for cough and wheezing. Negative for chest tightness and shortness of breath.   Cardiovascular: Negative for chest pain, palpitations and leg swelling.  Allergic/Immunologic: Negative for environmental allergies.    History Past Medical History:  Diagnosis Date  . Acute on chronic appendicitis s/p lap appy 06/10/2012 06/10/2012   surgery done 06-10-12  . Arthritis    ankle  . Complication of anesthesia   . Diabetes mellitus    TYPE 2- no meds in several months-was taken off meds -monitoring blood sugar.  Marland Kitchen GERD (gastroesophageal reflux disease)   . History of colon polyps   . Hyperlipidemia   . Hypertension   . Morbid obesity - BMI > 50 12/13/2012  . Pneumonia    none recent  . PONV (postoperative nausea and vomiting)   . Poor venous access    "usually difficult stick"  . Sinusitis     She has a past surgical history that includes Abdominal hysterectomy; Knee surgery (Right); Upper gastrointestinal endoscopy (04/14/2006); Colonoscopy (04/11/11); laparoscopic appendectomy (06/10/2012); Appendectomy; Gastric Roux-En-Y (N/A, 02/27/2015); and Colonoscopy with propofol (N/A, 05/11/2017).   Her family history includes Breast cancer in her paternal aunt and paternal aunt; Cancer (age of onset: 45) in her mother; Diabetes in her father and paternal grandmother; Heart failure in her father; Hyperlipidemia in her brother and mother; Hypertension in her mother.She reports that she has never smoked. She has never used smokeless tobacco. She reports that she does not  drink alcohol or use drugs.  Current Outpatient Medications on File Prior to Visit  Medication Sig Dispense Refill  . calcium carbonate (TUMS - DOSED IN MG ELEMENTAL CALCIUM) 500 MG chewable tablet Chew 1 tablet 2 (two) times daily by mouth.    . carvedilol (COREG) 12.5 MG tablet Take 1.5 tablets (18.75 mg total) by mouth 2 (two) times daily with a meal. 90 tablet 3  . Cyanocobalamin (VITAMIN B-12 SL) Place 1 drop under the tongue daily. 1 dropperful    . furosemide (LASIX) 40 MG tablet TAKE 1 TABLET BY MOUTH  DAILY 90 tablet 3  . glucose blood test strip Check blood sugar twice daily 100 each 11  . ibuprofen (ADVIL,MOTRIN) 200 MG tablet Take 400 mg by mouth every 8 (eight) hours as needed (for pain).    Marland Kitchen loratadine (CLARITIN) 10 MG tablet Take 1 tablet (10 mg total) by mouth daily. 90 tablet 3  . Loteprednol Etabonate (LOTEMAX) 0.5 % GEL Place 1 drop into both eyes 3 (three) times daily as needed (for dry eyes.).    Marland Kitchen Multiple Vitamins-Minerals (MULTIVITAMIN ADULT PO) Take 1 tablet daily by mouth. flintstone vitamins    . NONFORMULARY OR COMPOUNDED ITEM Thigh high compression socks  20-30 mmhg    Re- low ext edema 1 each 1  . pantoprazole (PROTONIX) 40 MG tablet Take 2 tablets (80 mg total) by mouth daily. 180 tablet 3   No current facility-administered medications on file prior to visit.      Objective:  Objective  Physical Exam Vitals signs and nursing note reviewed.  Constitutional:      Appearance: She is well-developed and well-nourished.  HENT:     Right Ear: External ear normal.     Left Ear: External ear normal.     Nose:     Right Sinus: Maxillary sinus tenderness and frontal sinus tenderness present.     Left Sinus: Maxillary sinus tenderness and frontal sinus tenderness present.     Mouth/Throat:     Pharynx: No posterior oropharyngeal edema or posterior oropharyngeal erythema.      Comments: + PND + errythemaEyes:     General:        Right eye: No discharge.         Left eye: No discharge.     Conjunctiva/sclera: Conjunctivae normal.  Cardiovascular:     Rate and Rhythm: Normal rate and regular rhythm.     Heart sounds: Normal heart sounds. No murmur.  Pulmonary:     Effort: Pulmonary effort is normal. No respiratory distress.     Breath sounds: Normal breath sounds. No wheezing or rales.  Chest:     Chest wall: No tenderness.  Musculoskeletal:        General: No edema.  Lymphadenopathy:     Cervical: Cervical adenopathy present.  Neurological:     Mental Status: She is alert and oriented to person, place, and time.    BP 132/80 (BP Location: Left Arm, Cuff Size: Large)   Pulse 65   Temp 99.6 F (37.6 C) (Oral)   Resp 16   Ht 5\' 6"  (1.676 m)   Wt (!) 341 lb 9.6 oz (154.9 kg)   SpO2 98%   BMI 55.14 kg/m  Wt Readings from Last 3 Encounters:  04/13/18 (!) 341 lb 9.6 oz (154.9 kg)  04/07/18 (!) 341 lb 2 oz (154.7 kg)  03/30/18 (!) 338 lb (153.3 kg)     Lab Results  Component Value Date   WBC 3.3 (L) 08/07/2017   HGB 12.7 08/07/2017   HCT 38.1 08/07/2017   PLT 242.0 08/07/2017   GLUCOSE 97 03/10/2018   CHOL 181 03/10/2018   TRIG 52.0 03/10/2018   HDL 61.70 03/10/2018   LDLDIRECT 129.1 10/17/2009   LDLCALC 109 (H) 03/10/2018   ALT 19 03/10/2018   AST 21 03/10/2018   NA 139 03/10/2018   K 4.1 03/10/2018   CL 102 03/10/2018   CREATININE 0.92 03/10/2018   BUN 17 03/10/2018   CO2 29 03/10/2018   TSH 2.89 08/07/2017   INR 1.1 (H) 10/16/2014   HGBA1C 6.1 03/10/2018   MICROALBUR 0.8 08/07/2017    Ct Head Wo Contrast  Result Date: 04/12/2018 CLINICAL DATA:  Headache, pressure sensation and difficulty focusing this morning. History of hypertension, hyperlipidemia, diabetes. EXAM: CT HEAD WITHOUT CONTRAST TECHNIQUE: Contiguous axial images were obtained from the base of the skull through the vertex without intravenous contrast. COMPARISON:  CT HEAD March 27, 2012 FINDINGS: BRAIN: No intraparenchymal hemorrhage, mass effect nor  midline shift. The ventricles and sulci are normal. No acute large vascular territory infarcts. No abnormal extra-axial fluid collections. Basal cisterns are patent. VASCULAR: Trace calcific atherosclerosis. SKULL/SOFT TISSUES: No skull fracture. No significant soft tissue swelling. ORBITS/SINUSES: The included ocular globes and orbital contents are normal.RIGHT maxillary antral wall calcification likely reflects unerupted tooth. Trace paranasal sinus mucosal thickening. Mastoid air cells are well aerated. OTHER: None. IMPRESSION: Normal noncontrast CT HEAD for age. Electronically Signed   By: Elon Alas M.D.   On: 04/12/2018 17:08     Assessment &  Plan:  Plan  I am having Laona start on amoxicillin-clavulanate and fluticasone. I am also having her maintain her glucose blood, calcium carbonate, Multiple Vitamins-Minerals (MULTIVITAMIN ADULT PO), furosemide, Cyanocobalamin (VITAMIN B-12 SL), Loteprednol Etabonate, ibuprofen, loratadine, NONFORMULARY OR COMPOUNDED ITEM, pantoprazole, and carvedilol.  Meds ordered this encounter  Medications  . amoxicillin-clavulanate (AUGMENTIN) 875-125 MG tablet    Sig: Take 1 tablet by mouth 2 (two) times daily.    Dispense:  20 tablet    Refill:  0  . fluticasone (FLONASE) 50 MCG/ACT nasal spray    Sig: Place 2 sprays into both nostrils daily.    Dispense:  16 g    Refill:  6    Problem List Items Addressed This Visit    None    Visit Diagnoses    Pansinusitis, unspecified chronicity    -  Primary   Relevant Medications   amoxicillin-clavulanate (AUGMENTIN) 875-125 MG tablet   fluticasone (FLONASE) 50 MCG/ACT nasal spray    rto prn   Follow-up: Return in about 4 weeks (around 05/11/2018) for hypertension.  [name], DO

## 2018-04-13 NOTE — Patient Instructions (Signed)

## 2018-04-22 ENCOUNTER — Telehealth: Payer: Self-pay | Admitting: *Deleted

## 2018-04-22 ENCOUNTER — Ambulatory Visit: Payer: 59 | Admitting: Family Medicine

## 2018-04-22 ENCOUNTER — Encounter (HOSPITAL_BASED_OUTPATIENT_CLINIC_OR_DEPARTMENT_OTHER): Payer: Self-pay | Admitting: Emergency Medicine

## 2018-04-22 ENCOUNTER — Encounter: Payer: Self-pay | Admitting: Family Medicine

## 2018-04-22 ENCOUNTER — Emergency Department (HOSPITAL_BASED_OUTPATIENT_CLINIC_OR_DEPARTMENT_OTHER): Payer: 59

## 2018-04-22 ENCOUNTER — Other Ambulatory Visit: Payer: Self-pay

## 2018-04-22 ENCOUNTER — Emergency Department (HOSPITAL_BASED_OUTPATIENT_CLINIC_OR_DEPARTMENT_OTHER)
Admission: EM | Admit: 2018-04-22 | Discharge: 2018-04-22 | Disposition: A | Discharge status: Home / Self Care | Payer: 59 | Attending: Emergency Medicine | Admitting: Emergency Medicine

## 2018-04-22 ENCOUNTER — Ambulatory Visit (INDEPENDENT_AMBULATORY_CARE_PROVIDER_SITE_OTHER): Payer: 59 | Admitting: Family Medicine

## 2018-04-22 VITALS — BP 156/77 | HR 54 | Ht 66.0 in | Wt 330.0 lb

## 2018-04-22 DIAGNOSIS — Z79899 Other long term (current) drug therapy: Secondary | ICD-10-CM | POA: Insufficient documentation

## 2018-04-22 DIAGNOSIS — I10 Essential (primary) hypertension: Secondary | ICD-10-CM | POA: Diagnosis not present

## 2018-04-22 DIAGNOSIS — R001 Bradycardia, unspecified: Secondary | ICD-10-CM | POA: Diagnosis not present

## 2018-04-22 DIAGNOSIS — R079 Chest pain, unspecified: Secondary | ICD-10-CM | POA: Diagnosis present

## 2018-04-22 DIAGNOSIS — R0789 Other chest pain: Secondary | ICD-10-CM | POA: Insufficient documentation

## 2018-04-22 DIAGNOSIS — M25561 Pain in right knee: Secondary | ICD-10-CM | POA: Diagnosis not present

## 2018-04-22 DIAGNOSIS — G8929 Other chronic pain: Secondary | ICD-10-CM | POA: Diagnosis not present

## 2018-04-22 DIAGNOSIS — M25562 Pain in left knee: Secondary | ICD-10-CM

## 2018-04-22 DIAGNOSIS — E119 Type 2 diabetes mellitus without complications: Secondary | ICD-10-CM | POA: Diagnosis not present

## 2018-04-22 LAB — CBC
HCT: 40.4 % (ref 36.0–46.0)
Hemoglobin: 12.4 g/dL (ref 12.0–15.0)
MCH: 27.8 pg (ref 26.0–34.0)
MCHC: 30.7 g/dL (ref 30.0–36.0)
MCV: 90.6 fL (ref 80.0–100.0)
PLATELETS: 228 10*3/uL (ref 150–400)
RBC: 4.46 MIL/uL (ref 3.87–5.11)
RDW: 13.6 % (ref 11.5–15.5)
WBC: 3.6 10*3/uL — ABNORMAL LOW (ref 4.0–10.5)
nRBC: 0 % (ref 0.0–0.2)

## 2018-04-22 LAB — BASIC METABOLIC PANEL
Anion gap: 5 (ref 5–15)
BUN: 14 mg/dL (ref 6–20)
CO2: 27 mmol/L (ref 22–32)
Calcium: 8.2 mg/dL — ABNORMAL LOW (ref 8.9–10.3)
Chloride: 107 mmol/L (ref 98–111)
Creatinine, Ser: 0.88 mg/dL (ref 0.44–1.00)
GFR calc Af Amer: >60 mL/min (ref >60)
GFR calc non Af Amer: >60 mL/min (ref >60)
Glucose, Bld: 129 mg/dL — ABNORMAL HIGH (ref 70–99)
Potassium: 3.6 mmol/L (ref 3.5–5.1)
Sodium: 139 mmol/L (ref 135–145)

## 2018-04-22 LAB — TROPONIN I
Troponin I: <0.03 ng/mL (ref <0.03)
Troponin I: <0.03 ng/mL (ref <0.03)

## 2018-04-22 MED ORDER — NITROGLYCERIN 0.4 MG SL SUBL: 0.4000 mg | SUBLINGUAL_TABLET | SUBLINGUAL | Status: DC | PRN

## 2018-04-22 MED ORDER — MELOXICAM 15 MG PO TABS: 15.0000 mg | ORAL_TABLET | Freq: Every day | ORAL | 2 refills | Status: DC

## 2018-04-22 MED ORDER — CARVEDILOL 12.5 MG PO TABS: 12.5000 mg | ORAL_TABLET | Freq: Two times a day (BID) | ORAL | 3 refills | Status: DC

## 2018-04-22 MED ORDER — ASPIRIN 81 MG PO CHEW
324.0000 mg | CHEWABLE_TABLET | Freq: Once | ORAL | Status: AC
  Administered 2018-04-22: 324 mg via ORAL
  Filled 2018-04-22: qty 4

## 2018-04-22 NOTE — ED Notes (Signed)
Pt c/o slight soreness to LT chest that increases with movement; sts she feels better over all.

## 2018-04-22 NOTE — Patient Instructions (Signed)
Get the x-rays of your knee at [facility] - make sure you get the ones I've ordered for you. These are the different medications you can take for this: Tylenol 500mg  1-2 tabs three times a day for pain. Capsaicin, aspercreme, or biofreeze topically up to four times a day may also help with pain. Some supplements that may help for arthritis: Boswellia extract, curcumin, pycnogenol Meloxicam 15mg  daily with food for pain and inflammation. Repeating cortisone injections is an option. Straight leg raises, knee extensions 3 sets of 10 once a day (add ankle weight if these become too easy). Consider physical therapy to strengthen muscles around the joint that hurts to take pressure off of the joint itself. Shoe inserts with good arch support may be helpful. Heat or ice 15 minutes at a time 3-4 times a day as needed to help with pain. Follow up and next steps will depend on the x-rays.

## 2018-04-22 NOTE — ED Notes (Signed)
Patient transported to X-ray 

## 2018-04-22 NOTE — ED Notes (Signed)
Attempted IV access x 1 w/o success; obtained blood and sent to lab; EDP aware.

## 2018-04-22 NOTE — Discharge Instructions (Addendum)
Decrease carvedilol (COREG) back to just 1 tablet daily.

## 2018-04-22 NOTE — Telephone Encounter (Signed)
Patient came in with complaints of her heart fluttering and left arm pain.  She had wanted to know Dr. Etter Sjogren findings from her last visit.  Advised that visit was only about sinuses and no tests were done that day.  Advised patient to go down to ER.  Patient stated that she will go to the ER to get checked out.  She has an appt on Monday so we will see her at that time.

## 2018-04-22 NOTE — ED Notes (Signed)
Patient left at this time with all belongings. 

## 2018-04-22 NOTE — ED Provider Notes (Signed)
[facility] EMERGENCY DEPARTMENT Provider Note   CSN: [id number] Arrival date & time: 04/22/18  1544     History   Chief Complaint Chief Complaint  Patient presents with  . Chest Pain    HPI Tammy [name] is a 57 y.o. female.  Pt presents to the ED today with CP.  She said she felt it starting last night.  It has continued today.  She denies sob.  She said it goes to her neck and to her shoulder.  Initially, she thought it was from her handbag.  She was also recently diagnosed with HTN and Coreg was increased.  Her HR has been low.     Past Medical History:  Diagnosis Date  . Acute on chronic appendicitis s/p lap appy 06/10/2012 06/10/2012   surgery done 06-10-12  . Arthritis    ankle  . Complication of anesthesia   . Diabetes mellitus    TYPE 2- no meds in several months-was taken off meds -monitoring blood sugar.  Marland Kitchen GERD (gastroesophageal reflux disease)   . History of colon polyps   . Hyperlipidemia   . Hypertension   . Morbid obesity - BMI > 50 12/13/2012  . Pneumonia    none recent  . PONV (postoperative nausea and vomiting)   . Poor venous access    "usually difficult stick"  . Sinusitis     Patient Active Problem List   Diagnosis Date Noted  . Lower extremity edema 07/04/2015  . Hoarseness 06/15/2015  . Bronchospasm with bronchitis, acute 05/24/2015  . Elevated CA-125 03/16/2015  . 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 02/27/2015  . Enteritis 01/25/2015  . Ascites 01/25/2015  . Abdominal pain 01/22/2015  . Ovarian cyst, left 01/03/2015  . Allergic rhinitis 12/21/2013  . Right ankle pain 11/10/2013  . Morbid obesity - BMI > 50 12/13/2012  . Chronic vomiting 12/13/2012  . Lactose intolerance 10/22/2012  . Left knee pain 07/26/2012  . Bacterial vaginosis 06/10/2012  . Bradycardia, sinus 03/28/2012  . Central positional vertigo 03/27/2012  . Tongue swelling 08/21/2011  . Urgency of urination 05/14/2011  . Personal history of colonic polyps  04/11/2011  . Otalgia 11/14/2010  . OTHER TENOSYNOVITIS OF HAND AND WRIST 07/19/2010  . DYSPEPSIA 10/17/2009  . BACK PAIN, LUMBAR, WITH RADICULOPATHY 12/27/2008  . VITAMIN B12 DEFICIENCY 09/29/2008  . SYNCOPE 08/21/2008  . ANKLE EDEMA, CHRONIC 12/16/2006  . DM (diabetes mellitus) type II, controlled, with peripheral vascular disorder ([place]) 11/09/2006  . Hyperlipidemia LDL goal <70 11/09/2006  . Essential hypertension 05/26/2006  . GERD 05/26/2006    Past Surgical History:  Procedure Laterality Date  . ABDOMINAL HYSTERECTOMY    . APPENDECTOMY     2'14  . COLONOSCOPY  04/11/11   5 mm polyp removed but not recovered  . COLONOSCOPY WITH PROPOFOL N/A 05/11/2017   Procedure: COLONOSCOPY WITH PROPOFOL;  Surgeon: Gatha Mayer, MD;  Location: [facility] ENDOSCOPY;  Service: Endoscopy;  Laterality: N/A;  . GASTRIC ROUX-EN-Y N/A 02/27/2015   Procedure: LAPAROSCOPIC ROUX-EN-Y GASTRIC BYPASS WITH UPPER ENDOSCOPY;  Surgeon: Johnathan Hausen, MD;  Location: [facility] ORS;  Service: General;  Laterality: N/A;  . KNEE SURGERY Right     KNEE SURGERY   . LAPAROSCOPIC APPENDECTOMY  06/10/2012   Procedure: APPENDECTOMY LAPAROSCOPIC;  Surgeon: Adin Hector, MD;  Location: [facility] ORS;  Service: General;  Laterality: N/A;  . UPPER GASTROINTESTINAL ENDOSCOPY  04/14/2006   Normal     OB History   No obstetric history  on file.      Home Medications    Prior to Admission medications   Medication Sig Start Date End Date Taking? Authorizing Provider  amoxicillin-clavulanate (AUGMENTIN) 875-125 MG tablet Take 1 tablet by mouth 2 (two) times daily. 04/13/18   [name], DO  calcium carbonate (TUMS - DOSED IN MG ELEMENTAL CALCIUM) 500 MG chewable tablet Chew 1 tablet 2 (two) times daily by mouth.    [provider]  carvedilol (COREG) 12.5 MG tablet Take 1 tablet (12.5 mg total) by mouth 2 (two) times daily with a meal. 04/22/18   Isla Pence, MD  Cyanocobalamin (VITAMIN B-12 SL) Place 1 drop under  the tongue daily. 1 dropperful    [provider]  fluticasone (FLONASE) 50 MCG/ACT nasal spray Place 2 sprays into both nostrils daily. 04/13/18   [name], DO  furosemide (LASIX) 40 MG tablet TAKE 1 TABLET BY MOUTH  DAILY 04/20/17   Carollee Herter, Kendrick Fries R, DO  glucose blood test strip Check blood sugar twice daily 11/28/16   Carollee Herter, Alferd Apa, DO  ibuprofen (ADVIL,MOTRIN) 200 MG tablet Take 400 mg by mouth every 8 (eight) hours as needed (for pain).    [provider]  loratadine (CLARITIN) 10 MG tablet Take 1 tablet (10 mg total) by mouth daily. 08/04/17   [name], DO  Loteprednol Etabonate (LOTEMAX) 0.5 % GEL Place 1 drop into both eyes 3 (three) times daily as needed (for dry eyes.).    [provider]  meloxicam (MOBIC) 15 MG tablet Take 1 tablet (15 mg total) by mouth daily. 04/22/18   Dene Gentry, MD  Multiple Vitamins-Minerals (MULTIVITAMIN ADULT PO) Take 1 tablet daily by mouth. flintstone vitamins    [provider]  NONFORMULARY OR COMPOUNDED ITEM Thigh high compression socks  20-30 mmhg    Re- low ext edema 03/02/18   Carollee Herter, Alferd Apa, DO  pantoprazole (PROTONIX) 40 MG tablet Take 2 tablets (80 mg total) by mouth daily. 03/02/18   [name], DO    Family History Family History  Problem Relation Age of Onset  . Hyperlipidemia Mother   . Hypertension Mother   . Cancer Mother 76       hodgkins lymphoma  . Diabetes Father   . Heart failure Father   . Hyperlipidemia Brother   . Diabetes Paternal Grandmother   . Breast cancer Paternal Aunt        unsure of age  . Breast cancer Paternal Aunt   . Colon cancer Neg Hx   . Heart attack Neg Hx   . Sudden death Neg Hx   . Colon polyps Neg Hx   . Esophageal cancer Neg Hx   . Stomach cancer Neg Hx   . Rectal cancer Neg Hx     Social History Social History   Tobacco Use  . Smoking status: Never Smoker  . Smokeless tobacco: Never Used  Substance  Use Topics  . Alcohol use: No    Alcohol/week: 0.0 standard drinks  . Drug use: No     Allergies   Ace inhibitors; Cheese; Red dye; and Tuna [fish allergy]   Review of Systems Review of Systems  Cardiovascular: Positive for chest pain.  All other systems reviewed and are negative.    Physical Exam Updated Vital Signs BP (!) 145/77   Pulse (!) 48   Temp 98.2 F (36.8 C) (Oral)   Resp 20   Ht  5\' 6"  (1.676 m)   Wt (!) 149 kg   SpO2 100%   BMI 53.02 kg/m   Physical Exam Vitals signs and nursing note reviewed.  Constitutional:      Appearance: She is well-developed. She is obese.  HENT:     Head: Normocephalic and atraumatic.  Eyes:     Extraocular Movements: Extraocular movements intact.     Pupils: Pupils are equal, round, and reactive to light.  Neck:     Musculoskeletal: Normal range of motion and neck supple.  Cardiovascular:     Rate and Rhythm: Regular rhythm. Bradycardia present.     Heart sounds: Normal heart sounds.  Pulmonary:     Effort: Pulmonary effort is normal.     Breath sounds: Normal breath sounds.  Abdominal:     General: Bowel sounds are normal.     Palpations: Abdomen is soft.  Musculoskeletal: Normal range of motion.  Skin:    General: Skin is warm and dry.     Capillary Refill: Capillary refill takes less than 2 seconds.  Neurological:     General: No focal deficit present.     Mental Status: She is alert and oriented to person, place, and time.      ED Treatments / Results  Labs (all labs ordered are listed, but only abnormal results are displayed) Labs Reviewed  CBC - Abnormal; Notable for the following components:      Result Value   WBC 3.6 (*)    All other components within normal limits  BASIC METABOLIC PANEL - Abnormal; Notable for the following components:   Glucose, Bld 129 (*)    Calcium 8.2 (*)    All other components within normal limits  TROPONIN I  TROPONIN I    EKG EKG  Interpretation  Date/Time:  Thursday April 22 2018 15:52:43 EST Ventricular Rate:  59 PR Interval:    QRS Duration: 89 QT Interval:  442 QTC Calculation: 438 R Axis:   33 Text Interpretation:  Sinus rhythm No significant change since last tracing Confirmed by Isla Pence [phone]) on 04/22/2018 4:04:35 PM   Radiology Dg Chest 2 View  Result Date: 04/22/2018 CLINICAL DATA:  Chest pain EXAM: CHEST - 2 VIEW COMPARISON:  08/16/2015 FINDINGS: Heart size upper normal. Vascularity normal. Negative for infiltrate effusion or mass. IMPRESSION: No active cardiopulmonary disease. Electronically Signed   By: Franchot Gallo M.D.   On: 04/22/2018 16:21    Procedures Procedures (including critical care time)  Medications Ordered in ED Medications  nitroGLYCERIN (NITROSTAT) SL tablet 0.4 mg (0 mg Sublingual Hold 04/22/18 1720)  aspirin chewable tablet 324 mg (324 mg Oral Given 04/22/18 1603)     Initial Impression / Assessment and Plan / ED Course  I have reviewed the triage vital signs and the nursing notes.  Pertinent labs & imaging results that were available during my care of the patient were reviewed by me and considered in my medical decision making (see chart for details).    Pt has not had CP while here.  2 sets of troponins ok.  Pt is stable for d/c.  I will have her f/u with cardiology.  She knows to return if worse.  Final Clinical Impressions(s) / ED Diagnoses   Final diagnoses:  Sinus bradycardia  Atypical chest pain    ED Discharge Orders         Ordered    carvedilol (COREG) 12.5 MG tablet  2 times daily with meals  04/22/18 1903           Isla Pence, MD 04/22/18 [phone]

## 2018-04-22 NOTE — ED Notes (Signed)
ED Provider at bedside. 

## 2018-04-22 NOTE — ED Triage Notes (Addendum)
Felt like heart was fluttering last night and has resolved. Left chest pain that radiates to neck. Was referred by PCP

## 2018-04-26 ENCOUNTER — Encounter: Payer: Self-pay | Admitting: Family Medicine

## 2018-04-26 ENCOUNTER — Ambulatory Visit
Admission: RE | Admit: 2018-04-26 | Discharge: 2018-04-26 | Disposition: A | Discharge status: Home / Self Care | Payer: 59 | Source: Ambulatory Visit | Attending: Family Medicine | Admitting: Family Medicine

## 2018-04-26 ENCOUNTER — Ambulatory Visit (INDEPENDENT_AMBULATORY_CARE_PROVIDER_SITE_OTHER): Payer: 59 | Admitting: Family Medicine

## 2018-04-26 ENCOUNTER — Ambulatory Visit (INDEPENDENT_AMBULATORY_CARE_PROVIDER_SITE_OTHER)
Admission: RE | Admit: 2018-04-26 | Discharge: 2018-04-26 | Disposition: A | Discharge status: Home / Self Care | Payer: 59 | Source: Ambulatory Visit | Attending: Family Medicine | Admitting: Family Medicine

## 2018-04-26 VITALS — BP 136/86 | HR 62 | Temp 98.1°F | Resp 16 | Ht 66.0 in | Wt 341.2 lb

## 2018-04-26 DIAGNOSIS — I10 Essential (primary) hypertension: Secondary | ICD-10-CM | POA: Diagnosis not present

## 2018-04-26 DIAGNOSIS — M25561 Pain in right knee: Secondary | ICD-10-CM

## 2018-04-26 DIAGNOSIS — G8929 Other chronic pain: Secondary | ICD-10-CM

## 2018-04-26 DIAGNOSIS — M1711 Unilateral primary osteoarthritis, right knee: Secondary | ICD-10-CM | POA: Diagnosis not present

## 2018-04-26 DIAGNOSIS — M25562 Pain in left knee: Secondary | ICD-10-CM

## 2018-04-26 DIAGNOSIS — M1712 Unilateral primary osteoarthritis, left knee: Secondary | ICD-10-CM | POA: Diagnosis not present

## 2018-04-26 NOTE — Assessment & Plan Note (Addendum)
Poorly controlled will alter medications, encouraged DASH diet, minimize caffeine and obtain adequate sleep. Report concerning symptoms and follow up as directed and as needed Add back 1/2 tab 5 mg norvasc F/u 3-4 weeks or sooner prn

## 2018-04-26 NOTE — Progress Notes (Signed)
Patient ID: Tammy [name], female    [name]: 1961-03-02  Age: 57 y.o. MRN: [id number]    Subjective:  Subjective  HPI Tammy [name] presents for bp f/u.  No complaints.    Review of Systems  Constitutional: Negative for appetite change, chills, diaphoresis, fatigue, fever and unexpected weight change.  HENT: Negative for congestion and hearing loss.   Eyes: Negative for pain, discharge, redness and visual disturbance.  Respiratory: Negative for cough, chest tightness, shortness of breath and wheezing.   Cardiovascular: Negative for chest pain, palpitations and leg swelling.  Gastrointestinal: Negative for abdominal pain, blood in stool, constipation, diarrhea, nausea and vomiting.  Endocrine: Negative for cold intolerance, heat intolerance, polydipsia, polyphagia and polyuria.  Genitourinary: Negative for difficulty urinating, dysuria, frequency, hematuria and urgency.  Musculoskeletal: Negative for back pain and myalgias.  Skin: Negative for rash.  Allergic/Immunologic: Negative for environmental allergies.  Neurological: Negative for dizziness, weakness, light-headedness, numbness and headaches.  Hematological: Does not bruise/bleed easily.  Psychiatric/Behavioral: Negative for suicidal ideas. The patient is not nervous/anxious.     History Past Medical History:  Diagnosis Date  . Acute on chronic appendicitis s/p lap appy 06/10/2012 06/10/2012   surgery done 06-10-12  . Arthritis    ankle  . Complication of anesthesia   . Diabetes mellitus    TYPE 2- no meds in several months-was taken off meds -monitoring blood sugar.  Marland Kitchen GERD (gastroesophageal reflux disease)   . History of colon polyps   . Hyperlipidemia   . Hypertension   . Morbid obesity - BMI > 50 12/13/2012  . Pneumonia    none recent  . PONV (postoperative nausea and vomiting)   . Poor venous access    "usually difficult stick"  . Sinusitis     She has a past surgical history that includes Abdominal  hysterectomy; Knee surgery (Right); Upper gastrointestinal endoscopy (04/14/2006); Colonoscopy (04/11/11); laparoscopic appendectomy (06/10/2012); Appendectomy; Gastric Roux-En-Y (N/A, 02/27/2015); and Colonoscopy with propofol (N/A, 05/11/2017).   Her family history includes Breast cancer in her paternal aunt and paternal aunt; Cancer (age of onset: 61) in her mother; Diabetes in her father and paternal grandmother; Heart failure in her father; Hyperlipidemia in her brother and mother; Hypertension in her mother.She reports that she has never smoked. She has never used smokeless tobacco. She reports that she does not drink alcohol or use drugs.  Current Outpatient Medications on File Prior to Visit  Medication Sig Dispense Refill  . calcium carbonate (TUMS - DOSED IN MG ELEMENTAL CALCIUM) 500 MG chewable tablet Chew 1 tablet 2 (two) times daily by mouth.    . carvedilol (COREG) 12.5 MG tablet Take 1 tablet (12.5 mg total) by mouth 2 (two) times daily with a meal. 90 tablet 3  . Cyanocobalamin (VITAMIN B-12 SL) Place 1 drop under the tongue daily. 1 dropperful    . fluticasone (FLONASE) 50 MCG/ACT nasal spray Place 2 sprays into both nostrils daily. 16 g 6  . furosemide (LASIX) 40 MG tablet TAKE 1 TABLET BY MOUTH  DAILY 90 tablet 3  . glucose blood test strip Check blood sugar twice daily 100 each 11  . ibuprofen (ADVIL,MOTRIN) 200 MG tablet Take 400 mg by mouth every 8 (eight) hours as needed (for pain).    Marland Kitchen loratadine (CLARITIN) 10 MG tablet Take 1 tablet (10 mg total) by mouth daily. 90 tablet 3  . Loteprednol Etabonate (LOTEMAX) 0.5 % GEL Place 1 drop into both eyes 3 (three) times daily as needed (for  dry eyes.).    Marland Kitchen meloxicam (MOBIC) 15 MG tablet Take 1 tablet (15 mg total) by mouth daily. 30 tablet 2  . Multiple Vitamins-Minerals (MULTIVITAMIN ADULT PO) Take 1 tablet daily by mouth. flintstone vitamins    . NONFORMULARY OR COMPOUNDED ITEM Thigh high compression socks  20-30 mmhg    Re- low ext  edema 1 each 1  . pantoprazole (PROTONIX) 40 MG tablet Take 2 tablets (80 mg total) by mouth daily. 180 tablet 3   No current facility-administered medications on file prior to visit.      Objective:  Objective  Physical Exam Vitals signs and nursing note reviewed.  Constitutional:      Appearance: She is well-developed.  HENT:     Head: Normocephalic and atraumatic.  Eyes:     Conjunctiva/sclera: Conjunctivae normal.  Neck:     Musculoskeletal: Normal range of motion and neck supple.     Thyroid: No thyromegaly.     Vascular: No carotid bruit or JVD.  Cardiovascular:     Rate and Rhythm: Normal rate and regular rhythm.     Heart sounds: Normal heart sounds. No murmur.  Pulmonary:     Effort: Pulmonary effort is normal. No respiratory distress.     Breath sounds: Normal breath sounds. No wheezing or rales.  Chest:     Chest wall: No tenderness.  Neurological:     Mental Status: She is alert and oriented to person, place, and time.    BP 136/86 (BP Location: Left Wrist, Cuff Size: Normal)   Pulse 62   Temp 98.1 F (36.7 C) (Oral)   Resp 16   Ht 5\' 6"  (1.676 m)   Wt (!) 341 lb 3.2 oz (154.8 kg)   SpO2 97%   BMI 55.07 kg/m  Wt Readings from Last 3 Encounters:  04/26/18 (!) 341 lb 3.2 oz (154.8 kg)  04/22/18 (!) 328 lb 7.8 oz (149 kg)  04/22/18 (!) 330 lb (149.7 kg)     Lab Results  Component Value Date   WBC 3.6 (L) 04/22/2018   HGB 12.4 04/22/2018   HCT 40.4 04/22/2018   PLT 228 04/22/2018   GLUCOSE 129 (H) 04/22/2018   CHOL 181 03/10/2018   TRIG 52.0 03/10/2018   HDL 61.70 03/10/2018   LDLDIRECT 129.1 10/17/2009   LDLCALC 109 (H) 03/10/2018   ALT 19 03/10/2018   AST 21 03/10/2018   NA 139 04/22/2018   K 3.6 04/22/2018   CL 107 04/22/2018   CREATININE 0.88 04/22/2018   BUN 14 04/22/2018   CO2 27 04/22/2018   TSH 2.89 08/07/2017   INR 1.1 (H) 10/16/2014   HGBA1C 6.1 03/10/2018   MICROALBUR 0.8 08/07/2017    No results found.   Assessment &  Plan:  Plan  I have discontinued Tammy [name]'s amoxicillin-clavulanate. I am also having her maintain her glucose blood, calcium carbonate, Multiple Vitamins-Minerals (MULTIVITAMIN ADULT PO), furosemide, Cyanocobalamin (VITAMIN B-12 SL), Loteprednol Etabonate, ibuprofen, loratadine, NONFORMULARY OR COMPOUNDED ITEM, pantoprazole, fluticasone, meloxicam, and carvedilol. 1. Essential hypertension Poorly controlled will alter medications, encouraged DASH diet, minimize caffeine and obtain adequate sleep. Report concerning symptoms and follow up as directed and as needed Add back norvasc 5 mg 1/2 tab daily F/u 3-4 weeks or sooner prn   No orders of the defined types were placed in this encounter.     Follow-up: Return in about 3 weeks (around 05/17/2018), or if symptoms worsen or fail to improve, for bp check.  Rosalita Chessman  Chase, DO

## 2018-04-26 NOTE — Patient Instructions (Signed)

## 2018-04-30 ENCOUNTER — Encounter: Payer: Self-pay | Admitting: Family Medicine

## 2018-04-30 LAB — HM DIABETES EYE EXAM

## 2018-04-30 NOTE — Progress Notes (Signed)
PCP and consultation requested by: [name], DO  Subjective:   HPI: Patient is a 57 y.o. female here for bilateral knee pain.  11/26: Patient has known history of arthritis and reports that she was walking to the store after getting out of the car on November 21 and heard a pop in the anterior aspect of her left knee. No pain immediately but as soon as she was walking around the store she started limping with anterior knee pain that has worsened since then and is now at an 8 out of 10 level, a tightness. She feels like she is developing some compensatory right knee pain that is 5 out of 10 and anterior as well. She is tried ibuprofen and icing with mild benefit. Associated swelling. No skin changes or numbness.  12/19: Patient returns reporting injections only helped with a heaviness sensation in her knees. Pain is 9/10 left knee, 7/10 right knee, sharp, worse with ambulation. No skin changes. Still with swelling.  Past Medical History:  Diagnosis Date  . Acute on chronic appendicitis s/p lap appy 06/10/2012 06/10/2012   surgery done 06-10-12  . Arthritis    ankle  . Complication of anesthesia   . Diabetes mellitus    TYPE 2- no meds in several months-was taken off meds -monitoring blood sugar.  Marland Kitchen GERD (gastroesophageal reflux disease)   . History of colon polyps   . Hyperlipidemia   . Hypertension   . Morbid obesity - BMI > 50 12/13/2012  . Pneumonia    none recent  . PONV (postoperative nausea and vomiting)   . Poor venous access    "usually difficult stick"  . Sinusitis     Current Outpatient Medications on File Prior to Visit  Medication Sig Dispense Refill  . calcium carbonate (TUMS - DOSED IN MG ELEMENTAL CALCIUM) 500 MG chewable tablet Chew 1 tablet 2 (two) times daily by mouth.    . Cyanocobalamin (VITAMIN B-12 SL) Place 1 drop under the tongue daily. 1 dropperful    . fluticasone (FLONASE) 50 MCG/ACT nasal spray Place 2 sprays into both nostrils daily. 16  g 6  . furosemide (LASIX) 40 MG tablet TAKE 1 TABLET BY MOUTH  DAILY 90 tablet 3  . glucose blood test strip Check blood sugar twice daily 100 each 11  . ibuprofen (ADVIL,MOTRIN) 200 MG tablet Take 400 mg by mouth every 8 (eight) hours as needed (for pain).    Marland Kitchen loratadine (CLARITIN) 10 MG tablet Take 1 tablet (10 mg total) by mouth daily. 90 tablet 3  . Loteprednol Etabonate (LOTEMAX) 0.5 % GEL Place 1 drop into both eyes 3 (three) times daily as needed (for dry eyes.).    Marland Kitchen Multiple Vitamins-Minerals (MULTIVITAMIN ADULT PO) Take 1 tablet daily by mouth. flintstone vitamins    . NONFORMULARY OR COMPOUNDED ITEM Thigh high compression socks  20-30 mmhg    Re- low ext edema 1 each 1  . pantoprazole (PROTONIX) 40 MG tablet Take 2 tablets (80 mg total) by mouth daily. 180 tablet 3   No current facility-administered medications on file prior to visit.     Past Surgical History:  Procedure Laterality Date  . ABDOMINAL HYSTERECTOMY    . APPENDECTOMY     2'14  . COLONOSCOPY  04/11/11   5 mm polyp removed but not recovered  . COLONOSCOPY WITH PROPOFOL N/A 05/11/2017   Procedure: COLONOSCOPY WITH PROPOFOL;  Surgeon: Gatha Mayer, MD;  Location: [facility];  Service: Endoscopy;  Laterality: N/A;  . GASTRIC ROUX-EN-Y N/A 02/27/2015   Procedure: LAPAROSCOPIC ROUX-EN-Y GASTRIC BYPASS WITH UPPER ENDOSCOPY;  Surgeon: Johnathan Hausen, MD;  Location: [facility];  Service: General;  Laterality: N/A;  . KNEE SURGERY Right     KNEE SURGERY   . LAPAROSCOPIC APPENDECTOMY  06/10/2012   Procedure: APPENDECTOMY LAPAROSCOPIC;  Surgeon: Adin Hector, MD;  Location: [facility];  Service: General;  Laterality: N/A;  . UPPER GASTROINTESTINAL ENDOSCOPY  04/14/2006   Normal    Allergies  Allergen Reactions  . Ace Inhibitors Anaphylaxis    Bowel angioedema  . Cheese Nausea And Vomiting  . Red Dye Nausea And Vomiting  . [name] Flash [Fish Allergy] Nausea And Vomiting    Social History   Socioeconomic History  . Marital  status: Married    Spouse name: Not on file  . Number of children: 0  . Years of education: Not on file  . Highest education level: Not on file  Occupational History  . 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[employer]  . Financial resource strain: Not on file  . Food insecurity:    Worry: Not on file    Inability: Not on file  . Transportation needs:    Medical: Not on file    Non-medical: Not on file  Tobacco Use  . Smoking status: Never Smoker  . Smokeless tobacco: Never Used  Substance and Sexual Activity  . Alcohol use: No    Alcohol/week: 0.0 standard drinks  . Drug use: No  . Sexual activity: Yes    Partners: Male  Lifestyle  . Physical activity:    Days per week: Not on file    Minutes per session: Not on file  . Stress: Not on file  Relationships  . Social connections:    Talks on phone: Not on file    Gets together: Not on file    Attends religious service: Not on file    Active member of club or organization: Not on file    Attends meetings of clubs or organizations: Not on file    Relationship status: Not on file  . Intimate partner violence:    Fear of current or ex partner: Not on file    Emotionally abused: Not on file    Physically abused: Not on file    Forced sexual activity: Not on file  Other Topics Concern  . Not on file  Social History Narrative   Exercise- no    Family History  Problem Relation Age of Onset  . Hyperlipidemia Mother   . Hypertension Mother   . Cancer Mother 21       hodgkins lymphoma  . Diabetes Father   . Heart failure Father   . Hyperlipidemia Brother   . Diabetes Paternal Grandmother   . Breast cancer Paternal Aunt        unsure of age  . Breast cancer Paternal Aunt   . Colon cancer Neg Hx   . Heart attack Neg Hx   . Sudden death Neg Hx   . Colon polyps Neg Hx   . Esophageal cancer Neg Hx   . Stomach cancer Neg Hx   . Rectal cancer Neg Hx     BP (!) 156/77   Pulse (!) 54   Ht 5'  6" (1.676 m)   Wt (!) 330 lb (149.7 kg)   BMI 53.26 kg/m   Review of Systems: See HPI above.  Objective:  Physical Exam:  Gen: NAD, comfortable in exam room  Right knee: No gross deformity, ecchymoses.  Difficult to detect effusion due to habitus. TTP medial and lateral joint lines, post patellar facets. ROM 0 - 100 degrees, 5/5 strength flexion and extension. Negative ant/post drawers. Negative valgus/varus testing. Negative lachmans. Negative mcmurrays, apleys, patellar apprehension. NV intact distally.  Left knee: No gross deformity, ecchymoses.  Difficult to detect effusion due to habitus. TTP medial and lateral joint lines, post patellar facets. ROM 0 - 100 degrees, 5/5 strength flexion and extension. Negative ant/post drawers. Negative valgus/varus testing. Negative lachmans. Negative mcmurrays, apleys, patellar apprehension. NV intact distally.   Assessment & Plan:  1. Bilateral knee pain - not improving as expected if this was due to arthritis though discussed if arthritis is severe injections may not provide relief.  Ordered weight-bearing radiographs of her knees to further assess.  Depending on results may consider MRI vs ortho referral vs viscosupplementation.  Tylenol, topical medications, meloxicam, home exercises.  Heat or ice.

## 2018-05-07 ENCOUNTER — Telehealth: Payer: Self-pay | Admitting: *Deleted

## 2018-05-07 NOTE — Telephone Encounter (Signed)
Received Diabetic Eye Exam Report from [facility]; forwarded to provider/SLS 01/03

## 2018-05-11 NOTE — Addendum Note (Signed)
Addended by: Dene Gentry on: 05/11/2018 02:42 PM   Modules accepted: Orders

## 2018-05-18 ENCOUNTER — Ambulatory Visit: Payer: 59

## 2018-05-20 ENCOUNTER — Encounter (INDEPENDENT_AMBULATORY_CARE_PROVIDER_SITE_OTHER): Payer: Self-pay

## 2018-05-20 ENCOUNTER — Encounter: Payer: Self-pay | Admitting: Family Medicine

## 2018-05-20 ENCOUNTER — Ambulatory Visit
Admission: RE | Admit: 2018-05-20 | Discharge: 2018-05-20 | Disposition: A | Discharge status: Home / Self Care | Payer: 59 | Source: Ambulatory Visit | Attending: Family Medicine | Admitting: Family Medicine

## 2018-05-20 DIAGNOSIS — Z1231 Encounter for screening mammogram for malignant neoplasm of breast: Secondary | ICD-10-CM | POA: Diagnosis not present

## 2018-05-25 ENCOUNTER — Telehealth: Payer: Self-pay | Admitting: Family Medicine

## 2018-05-25 ENCOUNTER — Ambulatory Visit (INDEPENDENT_AMBULATORY_CARE_PROVIDER_SITE_OTHER): Payer: 59 | Admitting: Family Medicine

## 2018-05-25 ENCOUNTER — Encounter (INDEPENDENT_AMBULATORY_CARE_PROVIDER_SITE_OTHER): Payer: Self-pay | Admitting: Family Medicine

## 2018-05-25 ENCOUNTER — Other Ambulatory Visit: Payer: Self-pay | Admitting: Family Medicine

## 2018-05-25 VITALS — BP 135/81 | HR 50 | Temp 97.6°F | Ht 65.0 in | Wt 334.0 lb

## 2018-05-25 DIAGNOSIS — Z1331 Encounter for screening for depression: Secondary | ICD-10-CM

## 2018-05-25 DIAGNOSIS — R5383 Other fatigue: Secondary | ICD-10-CM | POA: Diagnosis not present

## 2018-05-25 DIAGNOSIS — R0602 Shortness of breath: Secondary | ICD-10-CM | POA: Diagnosis not present

## 2018-05-25 DIAGNOSIS — M25569 Pain in unspecified knee: Secondary | ICD-10-CM

## 2018-05-25 DIAGNOSIS — E559 Vitamin D deficiency, unspecified: Secondary | ICD-10-CM

## 2018-05-25 DIAGNOSIS — R7303 Prediabetes: Secondary | ICD-10-CM

## 2018-05-25 DIAGNOSIS — Z6841 Body Mass Index (BMI) 40.0 and over, adult: Secondary | ICD-10-CM

## 2018-05-25 DIAGNOSIS — Z9189 Other specified personal risk factors, not elsewhere classified: Secondary | ICD-10-CM

## 2018-05-25 DIAGNOSIS — I10 Essential (primary) hypertension: Secondary | ICD-10-CM | POA: Diagnosis not present

## 2018-05-25 DIAGNOSIS — Z0289 Encounter for other administrative examinations: Secondary | ICD-10-CM

## 2018-05-25 NOTE — Progress Notes (Signed)
Office: [phone]  /  Fax: [phone]   Dear Dr. Carollee [name],   Thank you for referring Tammy [name] to our clinic. The following note includes my evaluation and treatment recommendations.  HPI:   Chief Complaint: OBESITY    Tammy [name] has been referred by Tammy Held, DO for consultation regarding her obesity and obesity related comorbidities.    Tammy [name] (MR# [id number]) is a 58 y.o. female who presents on 05/25/2018 for obesity evaluation and treatment. Current BMI is Body mass index is 55.58 kg/m.Marland Kitchen Tammy [name] has been struggling with her weight for many years and has been unsuccessful in either losing weight, maintaining weight loss, or reaching her healthy weight goal.     Tammy [name] has a history of gastric bypass, and she went to 280 lbs. She is lactose intolerant, but she can drink 2% milk.     Tammy [name] attended our information session and states she is currently in the action stage of change and ready to dedicate time achieving and maintaining a healthier weight. Tammy [name] is interested in becoming our patient and working on intensive lifestyle modifications including (but not limited to) diet, exercise and weight loss.    Tammy [name] states she thinks her family will eat healthier with  her her desired weight loss is 124 lbs she has been heavy most of  her life she started gaining weight in 1986 her heaviest weight ever was 362 lbs she is a picky eater and doesn't like to eat healthier foods  she has significant food cravings issues  she snacks frequently in the evenings she skips meals frequently she is frequently drinking liquids with calories she frequently makes poor food choices she frequently eats larger portions than normal  she struggles with emotional eating    Fatigue Tammy [name] feels her energy is lower than it should be. This has worsened with weight gain and has not worsened recently. Tammy [name] admits to daytime somnolence and  denies waking up  still tired. Patient is at risk for obstructive sleep apnea. Patent has a history of symptoms of daytime fatigue. Patient generally gets 6 hours of sleep per night, and states they generally have generally restful sleep. Snoring is present. Apneic episodes are not present. Epworth Sleepiness Score is 9.  Dyspnea on exertion Tammy [name] notes increasing shortness of breath with exercising and seems to be worsening over time with weight gain. She notes getting out of breath sooner with activity than she used to. This has not gotten worse recently. EKG-Normal sinus rhythm (bradtcardia) on 04/23/18. Tammy [name] denies orthopnea.  Hypertension Tammy [name] is a 58 y.o. female with hypertension. Tammy [name]'s blood pressure is controlled today. She denies chest pain, chest pressure, or headaches. She is working weight loss to help control her blood pressure with the goal of decreasing her risk of heart attack and stroke.   Pre-Diabetes Tammy [name] has a diagnosis of pre-diabetes based on her last elevated Hgb A1c in pre-diabetes range. She has had pre-diabetes for at least 5 years. She was informed this puts her at greater risk of developing diabetes. She is not taking metformin currently and continues to work on diet and exercise to decrease risk of diabetes. She denies nausea or hypoglycemia.  At risk for diabetes Tammy [name] is at higher than average risk for developing diabetes due to her obesity and pre-diabetes. She currently denies polyuria or polydipsia.  Vitamin D Deficiency Tammy [name] has a diagnosis of vitamin D deficiency. She is not on OTC Vit D supplementation. She notes fatigue  and denies nausea, vomiting or muscle weakness.  Depression Screen Tammy [name]'s Food and Mood (modified PHQ-9) score was  Depression screen PHQ 2/9 05/25/2018  Decreased Interest 1  Down, Depressed, Hopeless 0  PHQ - 2 Score 1  Altered sleeping 1  Tired, decreased energy 1  Change in appetite 1  Feeling bad or failure about yourself  0    Trouble concentrating 1  Moving slowly or fidgety/restless 0  Suicidal thoughts 0  PHQ-9 Score 5  Difficult doing work/chores Somewhat difficult  Some recent data might be hidden    ASSESSMENT AND PLAN:  Other fatigue - Plan: Vitamin B12, CBC With Differential, T3, T4, free, TSH  Shortness of breath on exertion - Plan: Lipid Panel With LDL/HDL Ratio  Prediabetes - Plan: Comprehensive metabolic panel, Folate, Hemoglobin A1c, Insulin, random  Essential hypertension  Vitamin D deficiency - Plan: VITAMIN D 25 Hydroxy (Vit-D Deficiency, Fractures)  Depression screening  At risk for diabetes mellitus  Class 3 severe obesity with serious comorbidity and body mass index (BMI) of 50.0 to 59.9 in adult, unspecified obesity type (HCC)  PLAN:  Fatigue Tammy [name] was informed that her fatigue may be related to obesity, depression or many other causes. Labs will be ordered, and in the meanwhile Tammy [name] has agreed to work on diet, exercise and weight loss to help with fatigue. Proper sleep hygiene was discussed including the need for 7-8 hours of quality sleep each night. A sleep study was not ordered based on symptoms and Epworth score.  Dyspnea on exertion Tammy [name]'s shortness of breath appears to be obesity related and exercise induced. She has agreed to work on weight loss and gradually increase exercise to treat her exercise induced shortness of breath. If Tammy [name] follows our instructions and loses weight without improvement of her shortness of breath, we will plan to refer to pulmonology. We will monitor this condition regularly. Tammy [name] agrees to this plan.  Hypertension We discussed sodium restriction, working on healthy weight loss, and a regular exercise program as the means to achieve improved blood pressure control. Tammy [name] agreed with this plan and agreed to follow up as directed. We will continue to monitor her blood pressure as well as her progress with the above lifestyle modifications.  Tammy [name] agrees to continue her medications as prescribed and will watch for signs of hypotension as she continues her lifestyle modifications. We will check CMP and FLP today. Tammy [name] agrees to follow up with our clinic in 2 weeks.  Pre-Diabetes Tammy [name] will continue to work on weight loss, exercise, and decreasing simple carbohydrates in her diet to help decrease the risk of diabetes. We dicussed metformin including benefits and risks. She was informed that eating too many simple carbohydrates or too many calories at one sitting increases the likelihood of GI side effects. Tammy [name] declined metformin for now and a prescription was not written today. We will check Hgb A1c and insulin today. Stella agrees to follow up with our clinic in 2 weeks as directed to monitor her progress.  Diabetes risk counselling Tammy [name] was given extended (15 minutes) diabetes prevention counseling today. She is 58 y.o. female and has risk factors for diabetes including obesity and pre-diabetes. We discussed intensive lifestyle modifications today with an emphasis on weight loss as well as increasing exercise and decreasing simple carbohydrates in her diet.  Vitamin D Deficiency Lachele was informed that low vitamin D levels contributes to fatigue and are associated with obesity, breast, and colon cancer. She will follow up for routine testing of  vitamin D, at least 2-3 times per year. She was informed of the risk of over-replacement of vitamin D and agrees to not increase her dose unless she discusses this with [name] first. We will check Vit D level today. Shakema agrees to follow up with our clinic in 2 weeks.  Depression Screen Taran had a mildly positive depression screening. Depression is commonly associated with obesity and often results in emotional eating behaviors. We will monitor this closely and work on CBT to help improve the non-hunger eating patterns. Referral to Psychology may be required if no improvement is seen as she  continues in our clinic.  Obesity Xochilth is currently in the action stage of change and her goal is to continue with weight loss efforts. I recommend Donnis begin the structured treatment plan as follows:  She has agreed to follow the Category 3 plan Waleska has been instructed to eventually work up to a goal of 150 minutes of combined cardio and strengthening exercise per week for weight loss and overall health benefits. We discussed the following Behavioral Modification Strategies today: increasing lean protein intake, increasing vegetables, work on meal planning and easy cooking plans, and planning for success   She was informed of the importance of frequent follow up visits to maximize her success with intensive lifestyle modifications for her multiple health conditions. She was informed we would discuss her lab results at her next visit unless there is a critical issue that needs to be addressed sooner. Hatsue agreed to keep her next visit at the agreed upon time to discuss these results.  ALLERGIES: Allergies  Allergen Reactions  . Ace Inhibitors Anaphylaxis    Bowel angioedema  . Cheese Nausea And Vomiting  . Red Dye Nausea And Vomiting  . [name] Flash [Fish Allergy] Nausea And Vomiting    MEDICATIONS: Current Outpatient Medications on File Prior to Visit  Medication Sig Dispense Refill  . amLODipine (NORVASC) 5 MG tablet Take 5 mg by mouth daily.    . carvedilol (COREG) 12.5 MG tablet Take 1 tablet (12.5 mg total) by mouth 2 (two) times daily with a meal. 90 tablet 3  . fluticasone (FLONASE) 50 MCG/ACT nasal spray Place 2 sprays into both nostrils daily. 16 g 6  . furosemide (LASIX) 40 MG tablet TAKE 1 TABLET BY MOUTH  DAILY 90 tablet 3  . glucose blood test strip Check blood sugar twice daily 100 each 11  . loratadine (CLARITIN) 10 MG tablet Take 1 tablet (10 mg total) by mouth daily. 90 tablet 3  . Multiple Vitamins-Minerals (MULTIVITAMIN ADULT PO) Take 1 tablet daily by mouth.  flintstone vitamins    . NONFORMULARY OR COMPOUNDED ITEM Thigh high compression socks  20-30 mmhg    Re- low ext edema 1 each 1  . pantoprazole (PROTONIX) 40 MG tablet Take 2 tablets (80 mg total) by mouth daily. 180 tablet 3   No current facility-administered medications on file prior to visit.     PAST MEDICAL HISTORY: Past Medical History:  Diagnosis Date  . Acute on chronic appendicitis s/p lap appy 06/10/2012 06/10/2012   surgery done 06-10-12  . Arthritis    ankle  . Back pain   . Chest pain   . Complication of anesthesia   . Constipation   . Diabetes mellitus    TYPE 2- no meds in several months-was taken off meds -monitoring blood sugar.  Marland Kitchen GERD (gastroesophageal reflux disease)   . History of colon polyps   . Hyperlipidemia   .  Hypertension   . Joint pain   . Lactose intolerance   . Morbid obesity - BMI > 50 12/13/2012  . Pneumonia    none recent  . PONV (postoperative nausea and vomiting)   . Poor venous access    "usually difficult stick"  . Prediabetes   . Sinusitis   . SOB (shortness of breath)   . Swelling   . Vitamin D deficiency     PAST SURGICAL HISTORY: Past Surgical History:  Procedure Laterality Date  . ABDOMINAL HYSTERECTOMY    . APPENDECTOMY     2'14  . COLONOSCOPY  04/11/11   5 mm polyp removed but not recovered  . COLONOSCOPY WITH PROPOFOL N/A 05/11/2017   Procedure: COLONOSCOPY WITH PROPOFOL;  Surgeon: Gatha Mayer, MD;  Location: [facility];  Service: Endoscopy;  Laterality: N/A;  . GASTRIC ROUX-EN-Y N/A 02/27/2015   Procedure: LAPAROSCOPIC ROUX-EN-Y GASTRIC BYPASS WITH UPPER ENDOSCOPY;  Surgeon: Johnathan Hausen, MD;  Location: [facility];  Service: General;  Laterality: N/A;  . KNEE SURGERY Right     KNEE SURGERY   . LAPAROSCOPIC APPENDECTOMY  06/10/2012   Procedure: APPENDECTOMY LAPAROSCOPIC;  Surgeon: Adin Hector, MD;  Location: [facility];  Service: General;  Laterality: N/A;  . UPPER GASTROINTESTINAL ENDOSCOPY  04/14/2006   Normal     SOCIAL HISTORY: Social History   Tobacco Use  . Smoking status: Never Smoker  . Smokeless tobacco: Never Used  Substance Use Topics  . Alcohol use: No    Alcohol/week: 0.0 standard drinks  . Drug use: No    FAMILY HISTORY: Family History  Problem Relation Age of Onset  . Hyperlipidemia Mother   . Hypertension Mother   . Cancer Mother 58       hodgkins lymphoma  . Diabetes Father   . Heart failure Father   . Heart disease Father   . Hyperlipidemia Brother   . Diabetes Paternal Grandmother   . Breast cancer Paternal Aunt        unsure of age  . Breast cancer Paternal Aunt   . Colon cancer Neg Hx   . Heart attack Neg Hx   . Sudden death Neg Hx   . Colon polyps Neg Hx   . Esophageal cancer Neg Hx   . Stomach cancer Neg Hx   . Rectal cancer Neg Hx     ROS: Review of Systems  Constitutional: Positive for malaise/fatigue. Negative for weight loss.  HENT: Positive for sinus pain.   Eyes:       + Wear glasses or contacts  Respiratory: Positive for shortness of breath (with exertion).   Cardiovascular: Negative for chest pain and orthopnea.       Negative chest pressure + Calf/leg pain with walking  Gastrointestinal: Positive for heartburn. Negative for nausea and vomiting.  Genitourinary: Negative for frequency.  Musculoskeletal:       Negative muscle weakness + Neck stiffness + Muscle or joint pain  Neurological: Negative for headaches.  Endo/Heme/Allergies: Negative for polydipsia.       Negative hypoglycemia    PHYSICAL EXAM: Blood pressure 135/81, pulse (!) 50, temperature 97.6 F (36.4 C), temperature source Oral, height 5\' 5"  (1.651 m), weight (!) 334 lb (151.5 kg), SpO2 100 %. Body mass index is 55.58 kg/m. Physical Exam Vitals signs reviewed.  Constitutional:      Appearance: Normal appearance. She is obese.  HENT:     Head: Normocephalic and atraumatic.     Nose: Nose normal.  Eyes:  General: No scleral icterus.    Extraocular Movements:  Extraocular movements intact.  Neck:     Musculoskeletal: Normal range of motion and neck supple.     Comments: No thyromegaly present Cardiovascular:     Rate and Rhythm: Regular rhythm. Bradycardia present.     Pulses: Normal pulses.     Heart sounds: Normal heart sounds.  Pulmonary:     Effort: Pulmonary effort is normal.     Breath sounds: Normal breath sounds.  Abdominal:     Palpations: Abdomen is soft.     Tenderness: There is no abdominal tenderness.     Comments: + Obesity  Musculoskeletal: Normal range of motion.     Right lower leg: No edema.     Left lower leg: No edema.  Skin:    General: Skin is warm and dry.  Neurological:     Mental Status: She is alert and oriented to person, place, and time.     Coordination: Coordination normal.  Psychiatric:        Mood and Affect: Mood normal.        Behavior: Behavior normal.     RECENT LABS AND TESTS: BMET    Component Value Date/Time   NA 139 04/22/2018 1713   K 3.6 04/22/2018 1713   CL 107 04/22/2018 1713   CO2 27 04/22/2018 1713   GLUCOSE 129 (H) 04/22/2018 1713   BUN 14 04/22/2018 1713   CREATININE 0.88 04/22/2018 1713   CREATININE 0.84 02/21/2014 1022   CALCIUM 8.2 (L) 04/22/2018 1713   GFRNONAA >60 04/22/2018 1713   GFRAA >60 04/22/2018 1713   Lab Results  Component Value Date   HGBA1C 6.1 03/10/2018   No results found for: INSULIN CBC    Component Value Date/Time   WBC 3.6 (L) 04/22/2018 1631   RBC 4.46 04/22/2018 1631   HGB 12.4 04/22/2018 1631   HCT 40.4 04/22/2018 1631   PLT 228 04/22/2018 1631   MCV 90.6 04/22/2018 1631   MCH 27.8 04/22/2018 1631   MCHC 30.7 04/22/2018 1631   RDW 13.6 04/22/2018 1631   LYMPHSABS 1.0 08/07/2017 0711   MONOABS 0.4 08/07/2017 0711   EOSABS 0.1 08/07/2017 0711   BASOSABS 0.0 08/07/2017 0711   Iron/TIBC/Ferritin/ %Sat No results found for: IRON, TIBC, FERRITIN, IRONPCTSAT Lipid Panel     Component Value Date/Time   CHOL 181 03/10/2018 0814   TRIG  52.0 03/10/2018 0814   HDL 61.70 03/10/2018 0814   CHOLHDL 3 03/10/2018 0814   VLDL 10.4 03/10/2018 0814   LDLCALC 109 (H) 03/10/2018 0814   LDLDIRECT 129.1 10/17/2009 0825   Hepatic Function Panel     Component Value Date/Time   PROT 7.1 03/10/2018 0814   ALBUMIN 3.9 03/10/2018 0814   AST 21 03/10/2018 0814   ALT 19 03/10/2018 0814   ALKPHOS 92 03/10/2018 0814   BILITOT 0.5 03/10/2018 0814   BILIDIR 0.0 05/02/2014 1503      Component Value Date/Time   TSH 2.89 08/07/2017 0711   TSH 3.181 02/21/2014 1038   TSH 1.87 10/13/2013 1132    ECG  shows NSR with a rate of 59 BPM INDIRECT CALORIMETER done today shows a VO2 of 251 and a REE of 1749.  Her calculated basal metabolic rate is 8841 thus her basal metabolic rate is worse than expected.       OBESITY BEHAVIORAL INTERVENTION VISIT  Today's visit was # 1   Starting weight: 334 lbs Starting date: 05/25/2018 Today's weight :  334 lbs Today's date: 05/25/2018 Total lbs lost to date: 0    ASK: We discussed the diagnosis of obesity with Mylinda Latina today and Kazia agreed to give [name] permission to discuss obesity behavioral modification therapy today.  ASSESS: Averyanna has the diagnosis of obesity and her BMI today is 55.58 Stasha is in the action stage of change   ADVISE: Kedra was educated on the multiple health risks of obesity as well as the benefit of weight loss to improve her health. She was advised of the need for long term treatment and the importance of lifestyle modifications to improve her current health and to decrease her risk of future health problems.  AGREE: Multiple dietary modification options and treatment options were discussed and  Trina agreed to follow the recommendations documented in the above note.  ARRANGE: Idamae was educated on the importance of frequent visits to treat obesity as outlined per CMS and USPSTF guidelines and agreed to schedule her next follow up appointment today.  I,  Trixie Dredge, am acting as transcriptionist for Ilene Qua, MD   I have reviewed the above documentation for accuracy and completeness, and I agree with the above. - Ilene Qua, MD

## 2018-05-25 NOTE — Telephone Encounter (Signed)
Patient notified that referral was placed.  She had also wanted to know if it is ok with Dr. Etter Sjogren for [facility] to manage BP while she is seeing them.  Advised patient per Dr. Etter Sjogren will await on provider to put in notes and then she will make decision.

## 2018-05-25 NOTE — Telephone Encounter (Signed)
FYI. Please advise regarding sports med referral.

## 2018-05-25 NOTE — Telephone Encounter (Signed)
Copied from [place] [phone]. Topic: General - Other >> May 25, 2018  3:19 PM Keene Breath wrote: Reason for CRM: Patient called to inform Dr. Etter Sjogren that Dr. Adair Patter from [facility] woult kike to manage her BP medication.  Also patient is requesting a referral  for her knee from the sports medicine doctors.  Please advise and call patient back at [phone]

## 2018-05-25 NOTE — Telephone Encounter (Signed)
Referral in

## 2018-05-28 ENCOUNTER — Other Ambulatory Visit: Payer: Self-pay | Admitting: *Deleted

## 2018-05-28 DIAGNOSIS — M25569 Pain in unspecified knee: Secondary | ICD-10-CM

## 2018-05-28 NOTE — Telephone Encounter (Signed)
Can you review notes from [facility]

## 2018-06-02 ENCOUNTER — Ambulatory Visit (INDEPENDENT_AMBULATORY_CARE_PROVIDER_SITE_OTHER): Payer: 59 | Admitting: Family Medicine

## 2018-06-02 ENCOUNTER — Ambulatory Visit: Payer: 59 | Admitting: Family Medicine

## 2018-06-02 ENCOUNTER — Encounter: Payer: Self-pay | Admitting: Family Medicine

## 2018-06-02 VITALS — BP 135/78 | HR 49 | Ht 65.0 in | Wt 332.0 lb

## 2018-06-02 DIAGNOSIS — M25562 Pain in left knee: Secondary | ICD-10-CM

## 2018-06-02 DIAGNOSIS — M1712 Unilateral primary osteoarthritis, left knee: Secondary | ICD-10-CM | POA: Diagnosis not present

## 2018-06-02 DIAGNOSIS — M25561 Pain in right knee: Secondary | ICD-10-CM | POA: Diagnosis not present

## 2018-06-02 DIAGNOSIS — G8929 Other chronic pain: Secondary | ICD-10-CM

## 2018-06-02 NOTE — Progress Notes (Signed)
PCP and consultation requested by: [name], DO  Subjective:   HPI: Patient is a 58 y.o. female here for bilateral knee pain.  11/26: Patient has known history of arthritis and reports that she was walking to the store after getting out of the car on November 21 and heard a pop in the anterior aspect of her left knee. No pain immediately but as soon as she was walking around the store she started limping with anterior knee pain that has worsened since then and is now at an 8 out of 10 level, a tightness. She feels like she is developing some compensatory right knee pain that is 5 out of 10 and anterior as well. She is tried ibuprofen and icing with mild benefit. Associated swelling. No skin changes or numbness.  12/19: Patient returns reporting injections only helped with a heaviness sensation in her knees. Pain is 9/10 left knee, 7/10 right knee, sharp, worse with ambulation. No skin changes. Still with swelling.  06/02/18: Patient returns today to start viscosupplementation on left knee. No skin changes. Pain is 10/10 and sharp, worse with walking.  Past Medical History:  Diagnosis Date  . Acute on chronic appendicitis s/p lap appy 06/10/2012 06/10/2012   surgery done 06-10-12  . Arthritis    ankle  . Back pain   . Chest pain   . Complication of anesthesia   . Constipation   . Diabetes mellitus    TYPE 2- no meds in several months-was taken off meds -monitoring blood sugar.  Marland Kitchen GERD (gastroesophageal reflux disease)   . History of colon polyps   . Hyperlipidemia   . Hypertension   . Joint pain   . Lactose intolerance   . Morbid obesity - BMI > 50 12/13/2012  . Pneumonia    none recent  . PONV (postoperative nausea and vomiting)   . Poor venous access    "usually difficult stick"  . Prediabetes   . Sinusitis   . SOB (shortness of breath)   . Swelling   . Vitamin D deficiency     Current Outpatient Medications on File Prior to Visit  Medication Sig  Dispense Refill  . amLODipine (NORVASC) 5 MG tablet Take 5 mg by mouth daily.    . carvedilol (COREG) 12.5 MG tablet Take 1 tablet (12.5 mg total) by mouth 2 (two) times daily with a meal. 90 tablet 3  . fluticasone (FLONASE) 50 MCG/ACT nasal spray Place 2 sprays into both nostrils daily. 16 g 6  . furosemide (LASIX) 40 MG tablet TAKE 1 TABLET BY MOUTH  DAILY 90 tablet 3  . glucose blood test strip Check blood sugar twice daily 100 each 11  . loratadine (CLARITIN) 10 MG tablet Take 1 tablet (10 mg total) by mouth daily. 90 tablet 3  . Multiple Vitamins-Minerals (MULTIVITAMIN ADULT PO) Take 1 tablet daily by mouth. flintstone vitamins    . NONFORMULARY OR COMPOUNDED ITEM Thigh high compression socks  20-30 mmhg    Re- low ext edema 1 each 1  . pantoprazole (PROTONIX) 40 MG tablet Take 2 tablets (80 mg total) by mouth daily. 180 tablet 3   No current facility-administered medications on file prior to visit.     Past Surgical History:  Procedure Laterality Date  . ABDOMINAL HYSTERECTOMY    . APPENDECTOMY     2'14  . COLONOSCOPY  04/11/11   5 mm polyp removed but not recovered  . COLONOSCOPY WITH PROPOFOL N/A 05/11/2017   Procedure:  COLONOSCOPY WITH PROPOFOL;  Surgeon: Gatha Mayer, MD;  Location: [facility];  Service: Endoscopy;  Laterality: N/A;  . GASTRIC ROUX-EN-Y N/A 02/27/2015   Procedure: LAPAROSCOPIC ROUX-EN-Y GASTRIC BYPASS WITH UPPER ENDOSCOPY;  Surgeon: Johnathan Hausen, MD;  Location: [facility];  Service: General;  Laterality: N/A;  . KNEE SURGERY Right     KNEE SURGERY   . LAPAROSCOPIC APPENDECTOMY  06/10/2012   Procedure: APPENDECTOMY LAPAROSCOPIC;  Surgeon: Adin Hector, MD;  Location: [facility];  Service: General;  Laterality: N/A;  . UPPER GASTROINTESTINAL ENDOSCOPY  04/14/2006   Normal    Allergies  Allergen Reactions  . Ace Inhibitors Anaphylaxis    Bowel angioedema  . Cheese Nausea And Vomiting  . Red Dye Nausea And Vomiting  . [name] Flash [Fish Allergy] Nausea And Vomiting     Social History   Socioeconomic History  . Marital status: Married    Spouse name: Dorothyann Peng  . Number of children: 0  . Years of education: Not on file  . Highest education level: Not on file  Occupational History  . 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 Financial resource strain: Not on file  . Food insecurity:    Worry: Not on file    Inability: Not on file  . Transportation needs:    Medical: Not on file    Non-medical: Not on file  Tobacco Use  . Smoking status: Never Smoker  . Smokeless tobacco: Never Used  Substance and Sexual Activity  . Alcohol use: No    Alcohol/week: 0.0 standard drinks  . Drug use: No  . Sexual activity: Yes    Partners: Male  Lifestyle  . Physical activity:    Days per week: Not on file    Minutes per session: Not on file  . Stress: Not on file  Relationships  . Social connections:    Talks on phone: Not on file    Gets together: Not on file    Attends religious service: Not on file    Active member of club or organization: Not on file    Attends meetings of clubs or organizations: Not on file    Relationship status: Not on file  . Intimate partner violence:    Fear of current or ex partner: Not on file    Emotionally abused: Not on file    Physically abused: Not on file    Forced sexual activity: Not on file  Other Topics Concern  . Not on file  Social History Narrative   Exercise- no    Family History  Problem Relation Age of Onset  . Hyperlipidemia Mother   . Hypertension Mother   . Cancer Mother 52       hodgkins lymphoma  . Diabetes Father   . Heart failure Father   . Heart disease Father   . Hyperlipidemia Brother   . Diabetes Paternal Grandmother   . Breast cancer Paternal Aunt        unsure of age  . Breast cancer Paternal Aunt   . Colon cancer Neg Hx   . Heart attack Neg Hx   . Sudden death Neg Hx   . Colon polyps Neg Hx   . Esophageal cancer Neg Hx   . Stomach  cancer Neg Hx   . Rectal cancer Neg Hx     BP 135/78   Pulse (!) 49   Ht 5\' 5"  (1.651 m)   Wt (!) 332  lb (150.6 kg)   BMI 55.25 kg/m   Review of Systems: See HPI above.     Objective:  Physical Exam:  Gen: NAD, comfortable in exam room Rest of exam not repeated today. Right knee: No gross deformity, ecchymoses.  Difficult to detect effusion due to habitus. TTP medial and lateral joint lines, post patellar facets. ROM 0 - 100 degrees, 5/5 strength flexion and extension. Negative ant/post drawers. Negative valgus/varus testing. Negative lachmans. Negative mcmurrays, apleys, patellar apprehension. NV intact distally.  Left knee: No gross deformity, ecchymoses.  Difficult to detect effusion due to habitus. TTP medial and lateral joint lines, post patellar facets. ROM 0 - 100 degrees, 5/5 strength flexion and extension. Negative ant/post drawers. Negative valgus/varus testing. Negative lachmans. Negative mcmurrays, apleys, patellar apprehension. NV intact distally.   Assessment & Plan:  1. Bilateral knee pain - 2/2 arthritis worse in left knee.  Started viscosupplementation with gelsyn-3 today.  Will follow up in 1 week for second injection.  Consider ortho referral if not improving.  After informed written consent timeout was performed, patient was lying supine on exam table. Left knee was prepped with alcohol swab and utilizing superolateral approach with ultrasound guidance, patient's left knee was injected intraarticularly with 21mL bupivicaine followed by Maryann Alar. Patient tolerated the procedure well without immediate complications.

## 2018-06-02 NOTE — Patient Instructions (Signed)
Follow up with me in 1 week for the second injection.

## 2018-06-03 DIAGNOSIS — R0602 Shortness of breath: Secondary | ICD-10-CM | POA: Diagnosis not present

## 2018-06-03 DIAGNOSIS — R5383 Other fatigue: Secondary | ICD-10-CM | POA: Diagnosis not present

## 2018-06-03 DIAGNOSIS — R7303 Prediabetes: Secondary | ICD-10-CM | POA: Diagnosis not present

## 2018-06-04 LAB — CBC WITH DIFFERENTIAL
Basophils Absolute: 0 10*3/uL (ref 0.0–0.2)
Basos: 1 %
EOS (ABSOLUTE): 0.1 10*3/uL (ref 0.0–0.4)
Eos: 3 %
HEMOGLOBIN: 12.9 g/dL (ref 11.1–15.9)
Hematocrit: 37.9 % (ref 34.0–46.6)
Immature Grans (Abs): 0 10*3/uL (ref 0.0–0.1)
Immature Granulocytes: 0 %
Lymphocytes Absolute: 1.4 10*3/uL (ref 0.7–3.1)
Lymphs: 44 %
MCH: 28.8 pg (ref 26.6–33.0)
MCHC: 34 g/dL (ref 31.5–35.7)
MCV: 85 fL (ref 79–97)
MONOCYTES: 9 %
Monocytes Absolute: 0.3 10*3/uL (ref 0.1–0.9)
NEUTROS ABS: 1.4 10*3/uL (ref 1.4–7.0)
Neutrophils: 43 %
RBC: 4.48 x10E6/uL (ref 3.77–5.28)
RDW: 13.5 % (ref 11.7–15.4)
WBC: 3.2 10*3/uL — ABNORMAL LOW (ref 3.4–10.8)

## 2018-06-04 LAB — T4, FREE: Free T4: 1.25 ng/dL (ref 0.82–1.77)

## 2018-06-04 LAB — LIPID PANEL WITH LDL/HDL RATIO
Cholesterol, Total: 183 mg/dL (ref 100–199)
HDL: 68 mg/dL (ref >39)
LDL Calculated: 105 mg/dL — ABNORMAL HIGH (ref 0–99)
LDl/HDL Ratio: 1.5 ratio (ref 0.0–3.2)
Triglycerides: 49 mg/dL (ref 0–149)
VLDL Cholesterol Cal: 10 mg/dL (ref 5–40)

## 2018-06-04 LAB — VITAMIN D 25 HYDROXY (VIT D DEFICIENCY, FRACTURES): Vit D, 25-Hydroxy: 6.8 ng/mL — ABNORMAL LOW (ref 30.0–100.0)

## 2018-06-04 LAB — HEMOGLOBIN A1C
Est. average glucose Bld gHb Est-mCnc: 128 mg/dL
Hgb A1c MFr Bld: 6.1 % — ABNORMAL HIGH (ref 4.8–5.6)

## 2018-06-04 LAB — COMPREHENSIVE METABOLIC PANEL
ALT: 24 IU/L (ref 0–32)
AST: 25 IU/L (ref 0–40)
Albumin/Globulin Ratio: 1.6 (ref 1.2–2.2)
Albumin: 4.3 g/dL (ref 3.8–4.9)
Alkaline Phosphatase: 100 IU/L (ref 39–117)
BUN/Creatinine Ratio: 13 (ref 9–23)
BUN: 12 mg/dL (ref 6–24)
Bilirubin Total: 0.5 mg/dL (ref 0.0–1.2)
CO2: 24 mmol/L (ref 20–29)
Calcium: 8.8 mg/dL (ref 8.7–10.2)
Chloride: 102 mmol/L (ref 96–106)
Creatinine, Ser: 0.9 mg/dL (ref 0.57–1.00)
GFR calc Af Amer: 82 mL/min/{1.73_m2} (ref >59)
GFR calc non Af Amer: 71 mL/min/{1.73_m2} (ref >59)
Globulin, Total: 2.7 g/dL (ref 1.5–4.5)
Glucose: 94 mg/dL (ref 65–99)
Potassium: 4.1 mmol/L (ref 3.5–5.2)
Sodium: 140 mmol/L (ref 134–144)
TOTAL PROTEIN: 7 g/dL (ref 6.0–8.5)

## 2018-06-04 LAB — FOLATE: Folate: 12.3 ng/mL (ref >3.0)

## 2018-06-04 LAB — TSH: TSH: 1.85 u[IU]/mL (ref 0.450–4.500)

## 2018-06-04 LAB — VITAMIN B12: Vitamin B-12: 547 pg/mL (ref 232–1245)

## 2018-06-04 LAB — T3: T3 TOTAL: 86 ng/dL (ref 71–180)

## 2018-06-04 LAB — INSULIN, RANDOM: INSULIN: 6.9 u[IU]/mL (ref 2.6–24.9)

## 2018-06-07 ENCOUNTER — Ambulatory Visit: Payer: 59 | Admitting: Family Medicine

## 2018-06-08 ENCOUNTER — Encounter (INDEPENDENT_AMBULATORY_CARE_PROVIDER_SITE_OTHER): Payer: Self-pay | Admitting: Family Medicine

## 2018-06-08 ENCOUNTER — Ambulatory Visit (INDEPENDENT_AMBULATORY_CARE_PROVIDER_SITE_OTHER): Payer: 59 | Admitting: Family Medicine

## 2018-06-08 VITALS — BP 111/71 | HR 50 | Temp 98.3°F | Ht 65.0 in | Wt 327.0 lb

## 2018-06-08 DIAGNOSIS — Z9189 Other specified personal risk factors, not elsewhere classified: Secondary | ICD-10-CM | POA: Diagnosis not present

## 2018-06-08 DIAGNOSIS — E1165 Type 2 diabetes mellitus with hyperglycemia: Secondary | ICD-10-CM

## 2018-06-08 DIAGNOSIS — E559 Vitamin D deficiency, unspecified: Secondary | ICD-10-CM | POA: Diagnosis not present

## 2018-06-08 DIAGNOSIS — Z6841 Body Mass Index (BMI) 40.0 and over, adult: Secondary | ICD-10-CM

## 2018-06-08 MED ORDER — METFORMIN HCL 500 MG PO TABS: 500.0000 mg | ORAL_TABLET | Freq: Two times a day (BID) | ORAL | 3 refills | Status: DC

## 2018-06-08 MED ORDER — VITAMIN D (ERGOCALCIFEROL) 1.25 MG (50000 UNIT) PO CAPS: 50000.0000 [IU] | ORAL_CAPSULE | ORAL | 0 refills | Status: DC

## 2018-06-09 NOTE — Progress Notes (Signed)
Office: [phone]  /  Fax: [phone]   HPI:   Chief Complaint: OBESITY Tammy [name] is here to discuss her progress with her obesity treatment plan. She is on the Category 3 plan and is following her eating plan approximately 50 % of the time. She states she is exercising 0 minutes 0 times per week. Tammy [name]'s first week was difficult trying to follow the plan secondary to changing food and stress at work. The second week was a bit easier, she switched lunch and dinner. She denies hunger and she is not a fn of milk.  Her weight is (!) 327 lb (148.3 kg) today and has had a weight loss of 7 pounds over a period of 2 weeks since her last visit. She has lost 7 lbs since starting treatment with [name].  Vitamin D Deficiency Tammy [name] has a diagnosis of vitamin D deficiency. She is not currently taking OTC Vit D. She notes fatigue and denies nausea, vomiting or muscle weakness.  At risk for osteopenia and osteoporosis Tammy [name] is at higher risk of osteopenia and osteoporosis due to vitamin D deficiency.   Diabetes II with Hyperglycemia Tammy [name] has a diagnosis of diabetes type II. Tammy [name] is not on any medications. She has had diabetes mellitus for at least 8 years. She is not on ACE/ARB, statin, or aspirin. Last A1c was 6.1. She denies hypoglycemia. She has been working on intensive lifestyle modifications including diet, exercise, and weight loss to help control her blood glucose levels.  ASSESSMENT AND PLAN:  Vitamin D deficiency - Plan: Vitamin D, Ergocalciferol, (DRISDOL) 1.25 MG (50000 UT) CAPS capsule  Type 2 diabetes mellitus with hyperglycemia, without long-term current use of insulin (HCC) - Plan: metFORMIN (GLUCOPHAGE) 500 MG tablet  At risk for osteoporosis  Class 3 severe obesity with serious comorbidity and body mass index (BMI) of 50.0 to 59.9 in adult, unspecified obesity type ([place])  PLAN:  Vitamin D Deficiency Hetal was informed that low vitamin D levels contributes to fatigue and  are associated with obesity, breast, and colon cancer. [name] agrees to start prescription Vit D @50 ,000 IU every week #4 with no refills. She will follow up for routine testing of vitamin D, at least 2-3 times per year. She was informed of the risk of over-replacement of vitamin D and agrees to not increase her dose unless she discusses this with [name] first. [name] agrees to follow up with our clinic in 2 weeks.  At risk for osteopenia and osteoporosis Tammy [name] was given extended (30 minutes) osteoporosis prevention counseling today. Tammy [name] is at risk for osteopenia and osteoporsis due to her vitamin D deficiency. She was encouraged to take her vitamin D and follow her higher calcium diet and increase strengthening exercise to help strengthen her bones and decrease her risk of osteopenia and osteoporosis.  Diabetes II with Hyperglycemia Tammy [name] has been given extensive diabetes education by myself today including ideal fasting and post-prandial blood glucose readings, individual ideal Hgb A1c goals and hypoglycemia prevention. We discussed the importance of good blood sugar control to decrease the likelihood of diabetic complications such as nephropathy, neuropathy, limb loss, blindness, coronary artery disease, and death. We discussed the importance of intensive lifestyle modification including diet, exercise and weight loss as the first line treatment for diabetes. Tammy [name] agrees to start metformin 500 mg PO q AM #30 with no refills. Tammy [name] agrees to follow up with our clinic in 2 weeks.  Obesity Tammy [name] is currently in the action stage of change. As such, her goal  is to continue with weight loss efforts She has agreed to follow the Category 3 plan Tammy [name] has been instructed to work up to a goal of 150 minutes of combined cardio and strengthening exercise per week for weight loss and overall health benefits. We discussed the following Behavioral Modification Strategies today: increasing lean protein  intake, increasing vegetables and work on meal planning and easy cooking plans, and planning for success Tammy [name] was given additional options for bread (english muffins, etc). No milk, she was given 2 oz of meat with 1st breakfast option or greek yogurt.  Tammy [name] has agreed to follow up with our clinic in 2 weeks. She was informed of the importance of frequent follow up visits to maximize her success with intensive lifestyle modifications for her multiple health conditions.  ALLERGIES: Allergies  Allergen Reactions  . Ace Inhibitors Anaphylaxis    Bowel angioedema  . Cheese Nausea And Vomiting  . Red Dye Nausea And Vomiting  . Tammy [name] Flash [Fish Allergy] Nausea And Vomiting    MEDICATIONS: Current Outpatient Medications on File Prior to Visit  Medication Sig Dispense Refill  . amLODipine (NORVASC) 5 MG tablet Take 5 mg by mouth daily.    . carvedilol (COREG) 12.5 MG tablet Take 1 tablet (12.5 mg total) by mouth 2 (two) times daily with a meal. 90 tablet 3  . fluticasone (FLONASE) 50 MCG/ACT nasal spray Place 2 sprays into both nostrils daily. 16 g 6  . furosemide (LASIX) 40 MG tablet TAKE 1 TABLET BY MOUTH  DAILY 90 tablet 3  . glucose blood test strip Check blood sugar twice daily 100 each 11  . loratadine (CLARITIN) 10 MG tablet Take 1 tablet (10 mg total) by mouth daily. 90 tablet 3  . Multiple Vitamins-Minerals (MULTIVITAMIN ADULT PO) Take 1 tablet daily by mouth. flintstone vitamins    . NONFORMULARY OR COMPOUNDED ITEM Thigh high compression socks  20-30 mmhg    Re- low ext edema 1 each 1  . pantoprazole (PROTONIX) 40 MG tablet Take 2 tablets (80 mg total) by mouth daily. 180 tablet 3   No current facility-administered medications on file prior to visit.     PAST MEDICAL HISTORY: Past Medical History:  Diagnosis Date  . Acute on chronic appendicitis s/p lap appy 06/10/2012 06/10/2012   surgery done 06-10-12  . Arthritis    ankle  . Back pain   . Chest pain   . Complication of anesthesia    . Constipation   . Diabetes mellitus    TYPE 2- no meds in several months-was taken off meds -monitoring blood sugar.  Marland Kitchen GERD (gastroesophageal reflux disease)   . History of colon polyps   . Hyperlipidemia   . Hypertension   . Joint pain   . Lactose intolerance   . Morbid obesity - BMI > 50 12/13/2012  . Pneumonia    none recent  . PONV (postoperative nausea and vomiting)   . Poor venous access    "usually difficult stick"  . Prediabetes   . Sinusitis   . SOB (shortness of breath)   . Swelling   . Vitamin D deficiency     PAST SURGICAL HISTORY: Past Surgical History:  Procedure Laterality Date  . ABDOMINAL HYSTERECTOMY    . APPENDECTOMY     2'14  . COLONOSCOPY  04/11/11   5 mm polyp removed but not recovered  . COLONOSCOPY WITH PROPOFOL N/A 05/11/2017   Procedure: COLONOSCOPY WITH PROPOFOL;  Surgeon: Gatha Mayer, MD;  Location: [facility];  Service: Endoscopy;  Laterality: N/A;  . GASTRIC ROUX-EN-Y N/A 02/27/2015   Procedure: LAPAROSCOPIC ROUX-EN-Y GASTRIC BYPASS WITH UPPER ENDOSCOPY;  Surgeon: Johnathan Hausen, MD;  Location: [facility];  Service: General;  Laterality: N/A;  . KNEE SURGERY Right     KNEE SURGERY   . LAPAROSCOPIC APPENDECTOMY  06/10/2012   Procedure: APPENDECTOMY LAPAROSCOPIC;  Surgeon: Adin Hector, MD;  Location: [facility];  Service: General;  Laterality: N/A;  . UPPER GASTROINTESTINAL ENDOSCOPY  04/14/2006   Normal    SOCIAL HISTORY: Social History   Tobacco Use  . Smoking status: Never Smoker  . Smokeless tobacco: Never Used  Substance Use Topics  . Alcohol use: No    Alcohol/week: 0.0 standard drinks  . Drug use: No    FAMILY HISTORY: Family History  Problem Relation Age of Onset  . Hyperlipidemia Mother   . Hypertension Mother   . Cancer Mother 24       hodgkins lymphoma  . Diabetes Father   . Heart failure Father   . Heart disease Father   . Hyperlipidemia Brother   . Diabetes Paternal Grandmother   . Breast cancer Paternal Aunt          unsure of age  . Breast cancer Paternal Aunt   . Colon cancer Neg Hx   . Heart attack Neg Hx   . Sudden death Neg Hx   . Colon polyps Neg Hx   . Esophageal cancer Neg Hx   . Stomach cancer Neg Hx   . Rectal cancer Neg Hx     ROS: Review of Systems  Constitutional: Positive for malaise/fatigue and weight loss.  Gastrointestinal: Negative for nausea and vomiting.  Musculoskeletal:       Negative muscle weakness  Endo/Heme/Allergies:       Negative hypoglycemia    PHYSICAL EXAM: Blood pressure 111/71, pulse (!) 50, temperature 98.3 F (36.8 C), temperature source Oral, height 5\' 5"  (1.651 m), weight (!) 327 lb (148.3 kg), SpO2 99 %. Body mass index is 54.42 kg/m. Physical Exam Vitals signs reviewed.  Constitutional:      Appearance: Normal appearance. She is obese.  Cardiovascular:     Rate and Rhythm: Normal rate.     Pulses: Normal pulses.  Pulmonary:     Effort: Pulmonary effort is normal.     Breath sounds: Normal breath sounds.  Musculoskeletal: Normal range of motion.  Skin:    General: Skin is warm and dry.  Neurological:     Mental Status: She is alert and oriented to person, place, and time.  Psychiatric:        Mood and Affect: Mood normal.        Behavior: Behavior normal.     RECENT LABS AND TESTS: BMET    Component Value Date/Time   NA 140 06/03/2018 1404   K 4.1 06/03/2018 1404   CL 102 06/03/2018 1404   CO2 24 06/03/2018 1404   GLUCOSE 94 06/03/2018 1404   GLUCOSE 129 (H) 04/22/2018 1713   BUN 12 06/03/2018 1404   CREATININE 0.90 06/03/2018 1404   CREATININE 0.84 02/21/2014 1022   CALCIUM 8.8 06/03/2018 1404   GFRNONAA 71 06/03/2018 1404   GFRAA 82 06/03/2018 1404   Lab Results  Component Value Date   HGBA1C 6.1 (H) 06/03/2018   HGBA1C 6.1 03/10/2018   HGBA1C 5.9 08/07/2017   HGBA1C 5.8 11/28/2016   HGBA1C 5.7 05/07/2016   Lab Results  Component Value Date   INSULIN 6.9  06/03/2018   CBC    Component Value Date/Time    WBC 3.2 (L) 06/03/2018 1404   WBC 3.6 (L) 04/22/2018 1631   RBC 4.48 06/03/2018 1404   RBC 4.46 04/22/2018 1631   HGB 12.9 06/03/2018 1404   HCT 37.9 06/03/2018 1404   PLT 228 04/22/2018 1631   MCV 85 06/03/2018 1404   MCH 28.8 06/03/2018 1404   MCH 27.8 04/22/2018 1631   MCHC 34.0 06/03/2018 1404   MCHC 30.7 04/22/2018 1631   RDW 13.5 06/03/2018 1404   LYMPHSABS 1.4 06/03/2018 1404   MONOABS 0.4 08/07/2017 0711   EOSABS 0.1 06/03/2018 1404   BASOSABS 0.0 06/03/2018 1404   Iron/TIBC/Ferritin/ %Sat No results found for: IRON, TIBC, FERRITIN, IRONPCTSAT Lipid Panel     Component Value Date/Time   CHOL 183 06/03/2018 1404   TRIG 49 06/03/2018 1404   HDL 68 06/03/2018 1404   CHOLHDL 3 03/10/2018 0814   VLDL 10.4 03/10/2018 0814   LDLCALC 105 (H) 06/03/2018 1404   LDLDIRECT 129.1 10/17/2009 0825   Hepatic Function Panel     Component Value Date/Time   PROT 7.0 06/03/2018 1404   ALBUMIN 4.3 06/03/2018 1404   AST 25 06/03/2018 1404   ALT 24 06/03/2018 1404   ALKPHOS 100 06/03/2018 1404   BILITOT 0.5 06/03/2018 1404   BILIDIR 0.0 05/02/2014 1503      Component Value Date/Time   TSH 1.850 06/03/2018 1404   TSH 2.89 08/07/2017 0711   TSH 3.181 02/21/2014 1038      OBESITY BEHAVIORAL INTERVENTION VISIT  Today's visit was # 2   Starting weight: 334 lbs Starting date: 05/25/2018 Today's weight : 327 lbs  Today's date: 06/08/2018 Total lbs lost to date: 7    ASK: We discussed the diagnosis of obesity with Mylinda Latina today and Hava agreed to give [name] permission to discuss obesity behavioral modification therapy today.  ASSESS: Heidi has the diagnosis of obesity and her BMI today is 54.42 Gennette is in the action stage of change   ADVISE: Belita was educated on the multiple health risks of obesity as well as the benefit of weight loss to improve her health. She was advised of the need for long term treatment and the importance of lifestyle modifications to  improve her current health and to decrease her risk of future health problems.  AGREE: Multiple dietary modification options and treatment options were discussed and  Tasharra agreed to follow the recommendations documented in the above note.  ARRANGE: Mikalah was educated on the importance of frequent visits to treat obesity as outlined per CMS and USPSTF guidelines and agreed to schedule her next follow up appointment today.  I, Trixie Dredge, am acting as transcriptionist for Ilene Qua, MD  I have reviewed the above documentation for accuracy and completeness, and I agree with the above. - Ilene Qua, MD

## 2018-06-11 ENCOUNTER — Ambulatory Visit: Payer: 59 | Admitting: Family Medicine

## 2018-06-16 ENCOUNTER — Encounter: Payer: Self-pay | Admitting: Family Medicine

## 2018-06-16 ENCOUNTER — Ambulatory Visit (INDEPENDENT_AMBULATORY_CARE_PROVIDER_SITE_OTHER): Payer: 59 | Admitting: Family Medicine

## 2018-06-16 DIAGNOSIS — M1712 Unilateral primary osteoarthritis, left knee: Secondary | ICD-10-CM

## 2018-06-16 NOTE — Progress Notes (Signed)
PCP and consultation requested by: Tammy Held, DO  Subjective:   HPI: Patient is a 58 y.o. female here for bilateral knee pain.  11/26: Patient has known history of arthritis and reports that she was walking to the store after getting out of the car on November 21 and heard a pop in the anterior aspect of her left knee. No pain immediately but as soon as she was walking around the store she started limping with anterior knee pain that has worsened since then and is now at an 8 out of 10 level, a tightness. She feels like she is developing some compensatory right knee pain that is 5 out of 10 and anterior as well. She is tried ibuprofen and icing with mild benefit. Associated swelling. No skin changes or numbness.  12/19: Patient returns reporting injections only helped with a heaviness sensation in her knees. Pain is 9/10 left knee, 7/10 right knee, sharp, worse with ambulation. No skin changes. Still with swelling.  06/02/18: Patient returns today to start viscosupplementation on left knee. No skin changes. Pain is 10/10 and sharp, worse with walking.  2/12: Patient reports she's continued to have pain at 8/10 level now, a little better. No skin changes.  Past Medical History:  Diagnosis Date  . Acute on chronic appendicitis s/p lap appy 06/10/2012 06/10/2012   surgery done 06-10-12  . Arthritis    ankle  . Back pain   . Chest pain   . Complication of anesthesia   . Constipation   . Diabetes mellitus    TYPE 2- no meds in several months-was taken off meds -monitoring blood sugar.  Marland Kitchen GERD (gastroesophageal reflux disease)   . History of colon polyps   . Hyperlipidemia   . Hypertension   . Joint pain   . Lactose intolerance   . Morbid obesity - BMI > 50 12/13/2012  . Pneumonia    none recent  . PONV (postoperative nausea and vomiting)   . Poor venous access    "usually difficult stick"  . Prediabetes   . Sinusitis   . SOB (shortness of breath)   . Swelling    . Vitamin D deficiency     Current Outpatient Medications on File Prior to Visit  Medication Sig Dispense Refill  . amLODipine (NORVASC) 5 MG tablet Take 5 mg by mouth daily.    . carvedilol (COREG) 12.5 MG tablet Take 1 tablet (12.5 mg total) by mouth 2 (two) times daily with a meal. 90 tablet 3  . fluticasone (FLONASE) 50 MCG/ACT nasal spray Place 2 sprays into both nostrils daily. 16 g 6  . furosemide (LASIX) 40 MG tablet TAKE 1 TABLET BY MOUTH  DAILY 90 tablet 3  . glucose blood test strip Check blood sugar twice daily 100 each 11  . loratadine (CLARITIN) 10 MG tablet Take 1 tablet (10 mg total) by mouth daily. 90 tablet 3  . metFORMIN (GLUCOPHAGE) 500 MG tablet Take 1 tablet (500 mg total) by mouth 2 (two) times daily with a meal. 180 tablet 3  . Multiple Vitamins-Minerals (MULTIVITAMIN ADULT PO) Take 1 tablet daily by mouth. flintstone vitamins    . NONFORMULARY OR COMPOUNDED ITEM Thigh high compression socks  20-30 mmhg    Re- low ext edema 1 each 1  . pantoprazole (PROTONIX) 40 MG tablet Take 2 tablets (80 mg total) by mouth daily. 180 tablet 3  . Vitamin D, Ergocalciferol, (DRISDOL) 1.25 MG (50000 UT) CAPS capsule Take 1 capsule (50,000  Units total) by mouth every 7 (seven) days. 4 capsule 0   No current facility-administered medications on file prior to visit.     Past Surgical History:  Procedure Laterality Date  . ABDOMINAL HYSTERECTOMY    . APPENDECTOMY     2'14  . COLONOSCOPY  04/11/11   5 mm polyp removed but not recovered  . COLONOSCOPY WITH PROPOFOL N/A 05/11/2017   Procedure: COLONOSCOPY WITH PROPOFOL;  Surgeon: Gatha Mayer, MD;  Location: [facility];  Service: Endoscopy;  Laterality: N/A;  . GASTRIC ROUX-EN-Y N/A 02/27/2015   Procedure: LAPAROSCOPIC ROUX-EN-Y GASTRIC BYPASS WITH UPPER ENDOSCOPY;  Surgeon: Johnathan Hausen, MD;  Location: [facility];  Service: General;  Laterality: N/A;  . KNEE SURGERY Right     KNEE SURGERY   . LAPAROSCOPIC APPENDECTOMY  06/10/2012    Procedure: APPENDECTOMY LAPAROSCOPIC;  Surgeon: Adin Hector, MD;  Location: [facility];  Service: General;  Laterality: N/A;  . UPPER GASTROINTESTINAL ENDOSCOPY  04/14/2006   Normal    Allergies  Allergen Reactions  . Ace Inhibitors Anaphylaxis    Bowel angioedema  . Cheese Nausea And Vomiting  . Red Dye Nausea And Vomiting  . [name] Flash [Fish Allergy] Nausea And Vomiting    Social History   Socioeconomic History  . Marital status: Married    Spouse name: Dorothyann Peng  . Number of children: 0  . Years of education: Not on file  . Highest education level: Not on file  Occupational History  . 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[employer]  . Financial resource strain: Not on file  . Food insecurity:    Worry: Not on file    Inability: Not on file  . Transportation needs:    Medical: Not on file    Non-medical: Not on file  Tobacco Use  . Smoking status: Never Smoker  . Smokeless tobacco: Never Used  Substance and Sexual Activity  . Alcohol use: No    Alcohol/week: 0.0 standard drinks  . Drug use: No  . Sexual activity: Yes    Partners: Male  Lifestyle  . Physical activity:    Days per week: Not on file    Minutes per session: Not on file  . Stress: Not on file  Relationships  . Social connections:    Talks on phone: Not on file    Gets together: Not on file    Attends religious service: Not on file    Active member of club or organization: Not on file    Attends meetings of clubs or organizations: Not on file    Relationship status: Not on file  . Intimate partner violence:    Fear of current or ex partner: Not on file    Emotionally abused: Not on file    Physically abused: Not on file    Forced sexual activity: Not on file  Other Topics Concern  . Not on file  Social History Narrative   Exercise- no    Family History  Problem Relation Age of Onset  . Hyperlipidemia Mother   . Hypertension Mother   . Cancer Mother 46        hodgkins lymphoma  . Diabetes Father   . Heart failure Father   . Heart disease Father   . Hyperlipidemia Brother   . Diabetes Paternal Grandmother   . Breast cancer Paternal Aunt        unsure of age  . Breast cancer Paternal Aunt   .  Colon cancer Neg Hx   . Heart attack Neg Hx   . Sudden death Neg Hx   . Colon polyps Neg Hx   . Esophageal cancer Neg Hx   . Stomach cancer Neg Hx   . Rectal cancer Neg Hx     BP (!) 151/81   Pulse (!) 54   Ht 5\' 6"  (1.676 m)   Wt (!) 332 lb (150.6 kg)   BMI 53.59 kg/m   Review of Systems: See HPI above.     Objective:  Physical Exam:  Gen: NAD, comfortable in exam room Rest of exam not repeated today. Right knee: No gross deformity, ecchymoses.  Difficult to detect effusion due to habitus. TTP medial and lateral joint lines, post patellar facets. ROM 0 - 100 degrees, 5/5 strength flexion and extension. Negative ant/post drawers. Negative valgus/varus testing. Negative lachmans. Negative mcmurrays, apleys, patellar apprehension. NV intact distally.  Left knee: No gross deformity, ecchymoses.  Difficult to detect effusion due to habitus. TTP medial and lateral joint lines, post patellar facets. ROM 0 - 100 degrees, 5/5 strength flexion and extension. Negative ant/post drawers. Negative valgus/varus testing. Negative lachmans. Negative mcmurrays, apleys, patellar apprehension. NV intact distally.   Assessment & Plan:  1. Bilateral knee pain - 2/2 arthritis worse in left knee.  Second gelsyn-3 injection given today - f/u in 1 week for third injection.  Consider ortho referral if not improving.  After informed written consent timeout was performed, patient was lying supine on exam table.  Left knee was prepped with alcohol swab and utilizing superolateral approach with ultrasound guidance, patient's left knee was injected intraarticularly with 82mL bupivicaine followed by gelsyn-3.  Patient tolerated procedure well without immediate  complications.

## 2018-06-21 ENCOUNTER — Encounter (INDEPENDENT_AMBULATORY_CARE_PROVIDER_SITE_OTHER): Payer: Self-pay | Admitting: Family Medicine

## 2018-06-21 ENCOUNTER — Ambulatory Visit (INDEPENDENT_AMBULATORY_CARE_PROVIDER_SITE_OTHER): Payer: 59 | Admitting: Family Medicine

## 2018-06-21 VITALS — BP 128/77 | HR 58 | Temp 98.1°F | Ht 65.0 in | Wt 329.0 lb

## 2018-06-21 DIAGNOSIS — R7303 Prediabetes: Secondary | ICD-10-CM

## 2018-06-21 DIAGNOSIS — Z9189 Other specified personal risk factors, not elsewhere classified: Secondary | ICD-10-CM

## 2018-06-21 DIAGNOSIS — E559 Vitamin D deficiency, unspecified: Secondary | ICD-10-CM | POA: Diagnosis not present

## 2018-06-21 DIAGNOSIS — Z6841 Body Mass Index (BMI) 40.0 and over, adult: Secondary | ICD-10-CM

## 2018-06-21 MED ORDER — VITAMIN D (ERGOCALCIFEROL) 1.25 MG (50000 UNIT) PO CAPS: 50000.0000 [IU] | ORAL_CAPSULE | ORAL | 0 refills | Status: DC

## 2018-06-21 NOTE — Progress Notes (Signed)
Office: [phone]  /  Fax: [phone]   HPI:   Chief Complaint: OBESITY Tammy [name] is here to discuss her progress with her obesity treatment plan. She is on the Category 3 plan and is following her eating plan approximately 40 % of the time. She states she is exercising 0 minutes 0 times per week. Tammy [name] took metformin and felt a bit sick until she found out she needed to take it with food. She is still struggling with meal planning. She is finding that she needs to swap lunch and dinner for convenience sake.  Her weight is (!) 329 lb (149.2 kg) today and has gained 2 pounds since her last visit. She has lost 5 lbs since starting treatment with [name].  Vitamin D Deficiency Tammy [name] has a diagnosis of vitamin D deficiency. She is currently taking prescription Vit D. She notes fatigue and denies nausea, vomiting or muscle weakness.  At risk for osteopenia and osteoporosis Tammy [name] is at higher risk of osteopenia and osteoporosis due to vitamin D deficiency.   Pre-Diabetes Tammy [name] has a diagnosis of pre-diabetes based on her elevated Hgb A1c and was informed this puts her at greater risk of developing diabetes. She notes occasional carbohydrate cravings, and minimal but improved GI side effects on metformin. She continues to work on diet and exercise to decrease risk of diabetes. She denies hypoglycemia.  ASSESSMENT AND PLAN:  Vitamin D deficiency - Plan: Vitamin D, Ergocalciferol, (DRISDOL) 1.25 MG (50000 UT) CAPS capsule  Prediabetes  At risk for osteoporosis  Class 3 severe obesity with serious comorbidity and body mass index (BMI) of 50.0 to 59.9 in adult, unspecified obesity type ([place])  PLAN:  Vitamin D Deficiency Tammy [name] was informed that low vitamin D levels contributes to fatigue and are associated with obesity, breast, and colon cancer. Tammy [name] agrees to continue taking prescription Vit D @50 ,000 IU every week #4 and we will refill for 1 month. She will follow up for routine testing  of vitamin D, at least 2-3 times per year. She was informed of the risk of over-replacement of vitamin D and agrees to not increase her dose unless she discusses this with [name] first. Tammy [name] agrees to follow up with our clinic in 2 weeks.  At risk for osteopenia and osteoporosis Tammy [name] was given extended (15 minutes) osteoporosis prevention counseling today. [name] is at risk for osteopenia and osteoporsis due to her vitamin D deficiency. She was encouraged to take her vitamin D and follow her higher calcium diet and increase strengthening exercise to help strengthen her bones and decrease her risk of osteopenia and osteoporosis.  Pre-Diabetes Tammy [name] will continue to work on weight loss, exercise, and decreasing simple carbohydrates in her diet to help decrease the risk of diabetes. We dicussed metformin including benefits and risks. She was informed that eating too many simple carbohydrates or too many calories at one sitting increases the likelihood of GI side effects. Tammy [name] agrees to continue taking metformin, and she agrees to follow up with our clinic in 2 weeks as directed to monitor her progress.  Obesity Tammy [name] is currently in the action stage of change. As such, her goal is to continue with weight loss efforts She has agreed to follow the Category 3 plan Tammy [name] has been instructed to work up to a goal of 150 minutes of combined cardio and strengthening exercise per week for weight loss and overall health benefits. We discussed the following Behavioral Modification Strategies today: increasing lean protein intake, increasing vegetables and work on  meal planning and easy cooking plans, and planning for success Tammy [name] is to start prepping food prior to workdays.  Tammy [name] has agreed to follow up with our clinic in 2 weeks. She was informed of the importance of frequent follow up visits to maximize her success with intensive lifestyle modifications for her multiple health  conditions.  ALLERGIES: Allergies  Allergen Reactions  . Ace Inhibitors Anaphylaxis    Bowel angioedema  . Cheese Nausea And Vomiting  . Red Dye Nausea And Vomiting  . [name] Flash [Fish Allergy] Nausea And Vomiting    MEDICATIONS: Current Outpatient Medications on File Prior to Visit  Medication Sig Dispense Refill  . amLODipine (NORVASC) 5 MG tablet Take 5 mg by mouth daily.    . carvedilol (COREG) 12.5 MG tablet Take 1 tablet (12.5 mg total) by mouth 2 (two) times daily with a meal. 90 tablet 3  . fluticasone (FLONASE) 50 MCG/ACT nasal spray Place 2 sprays into both nostrils daily. 16 g 6  . furosemide (LASIX) 40 MG tablet TAKE 1 TABLET BY MOUTH  DAILY 90 tablet 3  . glucose blood test strip Check blood sugar twice daily 100 each 11  . loratadine (CLARITIN) 10 MG tablet Take 1 tablet (10 mg total) by mouth daily. 90 tablet 3  . metFORMIN (GLUCOPHAGE) 500 MG tablet Take 1 tablet (500 mg total) by mouth 2 (two) times daily with a meal. 180 tablet 3  . Multiple Vitamins-Minerals (MULTIVITAMIN ADULT PO) Take 1 tablet daily by mouth. flintstone vitamins    . NONFORMULARY OR COMPOUNDED ITEM Thigh high compression socks  20-30 mmhg    Re- low ext edema 1 each 1  . pantoprazole (PROTONIX) 40 MG tablet Take 2 tablets (80 mg total) by mouth daily. 180 tablet 3   No current facility-administered medications on file prior to visit.     PAST MEDICAL HISTORY: Past Medical History:  Diagnosis Date  . Acute on chronic appendicitis s/p lap appy 06/10/2012 06/10/2012   surgery done 06-10-12  . Arthritis    ankle  . Back pain   . Chest pain   . Complication of anesthesia   . Constipation   . Diabetes mellitus    TYPE 2- no meds in several months-was taken off meds -monitoring blood sugar.  Marland Kitchen GERD (gastroesophageal reflux disease)   . History of colon polyps   . Hyperlipidemia   . Hypertension   . Joint pain   . Lactose intolerance   . Morbid obesity - BMI > 50 12/13/2012  . Pneumonia    none  recent  . PONV (postoperative nausea and vomiting)   . Poor venous access    "usually difficult stick"  . Prediabetes   . Sinusitis   . SOB (shortness of breath)   . Swelling   . Vitamin D deficiency     PAST SURGICAL HISTORY: Past Surgical History:  Procedure Laterality Date  . ABDOMINAL HYSTERECTOMY    . APPENDECTOMY     2'14  . COLONOSCOPY  04/11/11   5 mm polyp removed but not recovered  . COLONOSCOPY WITH PROPOFOL N/A 05/11/2017   Procedure: COLONOSCOPY WITH PROPOFOL;  Surgeon: Gatha Mayer, MD;  Location: [facility];  Service: Endoscopy;  Laterality: N/A;  . GASTRIC ROUX-EN-Y N/A 02/27/2015   Procedure: LAPAROSCOPIC ROUX-EN-Y GASTRIC BYPASS WITH UPPER ENDOSCOPY;  Surgeon: Johnathan Hausen, MD;  Location: [facility];  Service: General;  Laterality: N/A;  . KNEE SURGERY Right     KNEE SURGERY   . LAPAROSCOPIC  APPENDECTOMY  06/10/2012   Procedure: APPENDECTOMY LAPAROSCOPIC;  Surgeon: Adin Hector, MD;  Location: [facility];  Service: General;  Laterality: N/A;  . UPPER GASTROINTESTINAL ENDOSCOPY  04/14/2006   Normal    SOCIAL HISTORY: Social History   Tobacco Use  . Smoking status: Never Smoker  . Smokeless tobacco: Never Used  Substance Use Topics  . Alcohol use: No    Alcohol/week: 0.0 standard drinks  . Drug use: No    FAMILY HISTORY: Family History  Problem Relation Age of Onset  . Hyperlipidemia Mother   . Hypertension Mother   . Cancer Mother 59       hodgkins lymphoma  . Diabetes Father   . Heart failure Father   . Heart disease Father   . Hyperlipidemia Brother   . Diabetes Paternal Grandmother   . Breast cancer Paternal Aunt        unsure of age  . Breast cancer Paternal Aunt   . Colon cancer Neg Hx   . Heart attack Neg Hx   . Sudden death Neg Hx   . Colon polyps Neg Hx   . Esophageal cancer Neg Hx   . Stomach cancer Neg Hx   . Rectal cancer Neg Hx     ROS: Review of Systems  Constitutional: Positive for malaise/fatigue. Negative for weight  loss.  Gastrointestinal: Negative for nausea and vomiting.  Musculoskeletal:       Negative muscle weakness  Endo/Heme/Allergies:       Negative hypogycemia    PHYSICAL EXAM: Blood pressure 128/77, pulse (!) 58, temperature 98.1 F (36.7 C), temperature source Oral, height 5\' 5"  (1.651 m), weight (!) 329 lb (149.2 kg), SpO2 99 %. Body mass index is 54.75 kg/m. Physical Exam Vitals signs reviewed.  Constitutional:      Appearance: Normal appearance. She is obese.  Cardiovascular:     Rate and Rhythm: Normal rate.     Pulses: Normal pulses.  Pulmonary:     Effort: Pulmonary effort is normal.     Breath sounds: Normal breath sounds.  Musculoskeletal: Normal range of motion.  Skin:    General: Skin is warm and dry.  Neurological:     Mental Status: She is alert and oriented to person, place, and time.  Psychiatric:        Mood and Affect: Mood normal.        Behavior: Behavior normal.     RECENT LABS AND TESTS: BMET    Component Value Date/Time   NA 140 06/03/2018 1404   K 4.1 06/03/2018 1404   CL 102 06/03/2018 1404   CO2 24 06/03/2018 1404   GLUCOSE 94 06/03/2018 1404   GLUCOSE 129 (H) 04/22/2018 1713   BUN 12 06/03/2018 1404   CREATININE 0.90 06/03/2018 1404   CREATININE 0.84 02/21/2014 1022   CALCIUM 8.8 06/03/2018 1404   GFRNONAA 71 06/03/2018 1404   GFRAA 82 06/03/2018 1404   Lab Results  Component Value Date   HGBA1C 6.1 (H) 06/03/2018   HGBA1C 6.1 03/10/2018   HGBA1C 5.9 08/07/2017   HGBA1C 5.8 11/28/2016   HGBA1C 5.7 05/07/2016   Lab Results  Component Value Date   INSULIN 6.9 06/03/2018   CBC    Component Value Date/Time   WBC 3.2 (L) 06/03/2018 1404   WBC 3.6 (L) 04/22/2018 1631   RBC 4.48 06/03/2018 1404   RBC 4.46 04/22/2018 1631   HGB 12.9 06/03/2018 1404   HCT 37.9 06/03/2018 1404   PLT 228 04/22/2018  1631   MCV 85 06/03/2018 1404   MCH 28.8 06/03/2018 1404   MCH 27.8 04/22/2018 1631   MCHC 34.0 06/03/2018 1404   MCHC 30.7  04/22/2018 1631   RDW 13.5 06/03/2018 1404   LYMPHSABS 1.4 06/03/2018 1404   MONOABS 0.4 08/07/2017 0711   EOSABS 0.1 06/03/2018 1404   BASOSABS 0.0 06/03/2018 1404   Iron/TIBC/Ferritin/ %Sat No results found for: IRON, TIBC, FERRITIN, IRONPCTSAT Lipid Panel     Component Value Date/Time   CHOL 183 06/03/2018 1404   TRIG 49 06/03/2018 1404   HDL 68 06/03/2018 1404   CHOLHDL 3 03/10/2018 0814   VLDL 10.4 03/10/2018 0814   LDLCALC 105 (H) 06/03/2018 1404   LDLDIRECT 129.1 10/17/2009 0825   Hepatic Function Panel     Component Value Date/Time   PROT 7.0 06/03/2018 1404   ALBUMIN 4.3 06/03/2018 1404   AST 25 06/03/2018 1404   ALT 24 06/03/2018 1404   ALKPHOS 100 06/03/2018 1404   BILITOT 0.5 06/03/2018 1404   BILIDIR 0.0 05/02/2014 1503      Component Value Date/Time   TSH 1.850 06/03/2018 1404   TSH 2.89 08/07/2017 0711   TSH 3.181 02/21/2014 1038      OBESITY BEHAVIORAL INTERVENTION VISIT  Today's visit was # 3   Starting weight: 334 lbs Starting date: 05/25/2018 Today's weight : 329 lbs Today's date: 06/21/2018 Total lbs lost to date: 5    ASK: We discussed the diagnosis of obesity with Tammy [name] today and Tammy [name] agreed to give [name] permission to discuss obesity behavioral modification therapy today.  ASSESS: [place] has the diagnosis of obesity and her BMI today is 54.75 Tammy [name] is in the action stage of change   ADVISE: Tammy [name] was educated on the multiple health risks of obesity as well as the benefit of weight loss to improve her health. She was advised of the need for long term treatment and the importance of lifestyle modifications to improve her current health and to decrease her risk of future health problems.  AGREE: Multiple dietary modification options and treatment options were discussed and  Tammy [name] agreed to follow the recommendations documented in the above note.  ARRANGE: Tammy [name] was educated on the importance of frequent visits to treat  obesity as outlined per CMS and USPSTF guidelines and agreed to schedule her next follow up appointment today.  I, Trixie Dredge, am acting as transcriptionist for Ilene Qua, MD  I have reviewed the above documentation for accuracy and completeness, and I agree with the above. - Ilene Qua, MD

## 2018-06-25 ENCOUNTER — Encounter: Payer: Self-pay | Admitting: Family Medicine

## 2018-06-25 ENCOUNTER — Ambulatory Visit (INDEPENDENT_AMBULATORY_CARE_PROVIDER_SITE_OTHER): Payer: 59 | Admitting: Family Medicine

## 2018-06-25 DIAGNOSIS — M1712 Unilateral primary osteoarthritis, left knee: Secondary | ICD-10-CM | POA: Diagnosis not present

## 2018-06-28 ENCOUNTER — Encounter: Payer: Self-pay | Admitting: Family Medicine

## 2018-06-28 NOTE — Progress Notes (Signed)
PCP and consultation requested by: [name], DO  Subjective:   HPI: Patient is a 58 y.o. female here for bilateral knee pain.  11/26: Patient has known history of arthritis and reports that she was walking to the store after getting out of the car on November 21 and heard a pop in the anterior aspect of her left knee. No pain immediately but as soon as she was walking around the store she started limping with anterior knee pain that has worsened since then and is now at an 8 out of 10 level, a tightness. She feels like she is developing some compensatory right knee pain that is 5 out of 10 and anterior as well. She is tried ibuprofen and icing with mild benefit. Associated swelling. No skin changes or numbness.  12/19: Patient returns reporting injections only helped with a heaviness sensation in her knees. Pain is 9/10 left knee, 7/10 right knee, sharp, worse with ambulation. No skin changes. Still with swelling.  06/02/18: Patient returns today to start viscosupplementation on left knee. No skin changes. Pain is 10/10 and sharp, worse with walking.  2/12: Patient reports she's continued to have pain at 8/10 level now, a little better. No skin changes.  2/21: Patient reports pain has improved some - currently 0/10 level. No skin changes.  Past Medical History:  Diagnosis Date  . Acute on chronic appendicitis s/p lap appy 06/10/2012 06/10/2012   surgery done 06-10-12  . Arthritis    ankle  . Back pain   . Chest pain   . Complication of anesthesia   . Constipation   . Diabetes mellitus    TYPE 2- no meds in several months-was taken off meds -monitoring blood sugar.  Marland Kitchen GERD (gastroesophageal reflux disease)   . History of colon polyps   . Hyperlipidemia   . Hypertension   . Joint pain   . Lactose intolerance   . Morbid obesity - BMI > 50 12/13/2012  . Pneumonia    none recent  . PONV (postoperative nausea and vomiting)   . Poor venous access    "usually  difficult stick"  . Prediabetes   . Sinusitis   . SOB (shortness of breath)   . Swelling   . Vitamin D deficiency     Current Outpatient Medications on File Prior to Visit  Medication Sig Dispense Refill  . amLODipine (NORVASC) 5 MG tablet Take 5 mg by mouth daily.    . carvedilol (COREG) 12.5 MG tablet Take 1 tablet (12.5 mg total) by mouth 2 (two) times daily with a meal. 90 tablet 3  . fluticasone (FLONASE) 50 MCG/ACT nasal spray Place 2 sprays into both nostrils daily. 16 g 6  . furosemide (LASIX) 40 MG tablet TAKE 1 TABLET BY MOUTH  DAILY 90 tablet 3  . glucose blood test strip Check blood sugar twice daily 100 each 11  . loratadine (CLARITIN) 10 MG tablet Take 1 tablet (10 mg total) by mouth daily. 90 tablet 3  . metFORMIN (GLUCOPHAGE) 500 MG tablet Take 1 tablet (500 mg total) by mouth 2 (two) times daily with a meal. 180 tablet 3  . Multiple Vitamins-Minerals (MULTIVITAMIN ADULT PO) Take 1 tablet daily by mouth. flintstone vitamins    . NONFORMULARY OR COMPOUNDED ITEM Thigh high compression socks  20-30 mmhg    Re- low ext edema 1 each 1  . pantoprazole (PROTONIX) 40 MG tablet Take 2 tablets (80 mg total) by mouth daily. 180 tablet 3  .  Vitamin D, Ergocalciferol, (DRISDOL) 1.25 MG (50000 UT) CAPS capsule Take 1 capsule (50,000 Units total) by mouth every 7 (seven) days. 4 capsule 0   No current facility-administered medications on file prior to visit.     Past Surgical History:  Procedure Laterality Date  . ABDOMINAL HYSTERECTOMY    . APPENDECTOMY     2'14  . COLONOSCOPY  04/11/11   5 mm polyp removed but not recovered  . COLONOSCOPY WITH PROPOFOL N/A 05/11/2017   Procedure: COLONOSCOPY WITH PROPOFOL;  Surgeon: Gatha Mayer, MD;  Location: [facility];  Service: Endoscopy;  Laterality: N/A;  . GASTRIC ROUX-EN-Y N/A 02/27/2015   Procedure: LAPAROSCOPIC ROUX-EN-Y GASTRIC BYPASS WITH UPPER ENDOSCOPY;  Surgeon: Johnathan Hausen, MD;  Location: [facility];  Service: General;   Laterality: N/A;  . KNEE SURGERY Right     KNEE SURGERY   . LAPAROSCOPIC APPENDECTOMY  06/10/2012   Procedure: APPENDECTOMY LAPAROSCOPIC;  Surgeon: Adin Hector, MD;  Location: [facility];  Service: General;  Laterality: N/A;  . UPPER GASTROINTESTINAL ENDOSCOPY  04/14/2006   Normal    Allergies  Allergen Reactions  . Ace Inhibitors Anaphylaxis    Bowel angioedema  . Cheese Nausea And Vomiting  . Red Dye Nausea And Vomiting  . [name] Flash [Fish Allergy] Nausea And Vomiting    Social History   Socioeconomic History  . Marital status: Married    Spouse name: Dorothyann Peng  . Number of children: 0  . Years of education: Not on file  . Highest education level: Not on file  Occupational History  . 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[employer]  . Financial resource strain: Not on file  . Food insecurity:    Worry: Not on file    Inability: Not on file  . Transportation needs:    Medical: Not on file    Non-medical: Not on file  Tobacco Use  . Smoking status: Never Smoker  . Smokeless tobacco: Never Used  Substance and Sexual Activity  . Alcohol use: No    Alcohol/week: 0.0 standard drinks  . Drug use: No  . Sexual activity: Yes    Partners: Male  Lifestyle  . Physical activity:    Days per week: Not on file    Minutes per session: Not on file  . Stress: Not on file  Relationships  . Social connections:    Talks on phone: Not on file    Gets together: Not on file    Attends religious service: Not on file    Active member of club or organization: Not on file    Attends meetings of clubs or organizations: Not on file    Relationship status: Not on file  . Intimate partner violence:    Fear of current or ex partner: Not on file    Emotionally abused: Not on file    Physically abused: Not on file    Forced sexual activity: Not on file  Other Topics Concern  . Not on file  Social History Narrative   Exercise- no    Family History  Problem  Relation Age of Onset  . Hyperlipidemia Mother   . Hypertension Mother   . Cancer Mother 58       hodgkins lymphoma  . Diabetes Father   . Heart failure Father   . Heart disease Father   . Hyperlipidemia Brother   . Diabetes Paternal Grandmother   . Breast cancer Paternal Aunt  unsure of age  . Breast cancer Paternal Aunt   . Colon cancer Neg Hx   . Heart attack Neg Hx   . Sudden death Neg Hx   . Colon polyps Neg Hx   . Esophageal cancer Neg Hx   . Stomach cancer Neg Hx   . Rectal cancer Neg Hx     BP 115/73   Pulse 64   Ht 5\' 6"  (1.676 m)   Wt (!) 326 lb (147.9 kg)   BMI 52.62 kg/m   Review of Systems: See HPI above.     Objective:  Physical Exam:  Gen: NAD, comfortable in exam room  Rest of exam not repeated today.  Right knee: No gross deformity, ecchymoses.  Difficult to detect effusion due to habitus. TTP medial and lateral joint lines, post patellar facets. ROM 0 - 100 degrees, 5/5 strength flexion and extension. Negative ant/post drawers. Negative valgus/varus testing. Negative lachmans. Negative mcmurrays, apleys, patellar apprehension. NV intact distally.  Left knee: No gross deformity, ecchymoses.  Difficult to detect effusion due to habitus. TTP medial and lateral joint lines, post patellar facets. ROM 0 - 100 degrees, 5/5 strength flexion and extension. Negative ant/post drawers. Negative valgus/varus testing. Negative lachmans. Negative mcmurrays, apleys, patellar apprehension. NV intact distally.   Assessment & Plan:  1. Bilateral knee pain - 2/2 arthritis worse in left knee.  Third gelsyn-3 injection given today.  Interested in doing this for right knee as well - will check on approval for this.  Let us know how left knee is doing in about 4 weeks.  Consider ortho referral if not improving.  After informed written consent timeout was performed, patient was lying supine on exam table. Left knee was prepped with alcohol swab and utilizing  superolateral approach with ultrasound guidance, patient's left knee was injected intraarticularly with 47mL bupivicaine followed by gelsyn-3. Patient tolerated the procedure well without immediate complications.

## 2018-07-06 ENCOUNTER — Encounter (INDEPENDENT_AMBULATORY_CARE_PROVIDER_SITE_OTHER): Payer: Self-pay | Admitting: Family Medicine

## 2018-07-06 ENCOUNTER — Ambulatory Visit (INDEPENDENT_AMBULATORY_CARE_PROVIDER_SITE_OTHER): Payer: 59 | Admitting: Family Medicine

## 2018-07-06 VITALS — BP 118/71 | HR 58 | Temp 98.1°F | Ht 65.0 in | Wt 330.0 lb

## 2018-07-06 DIAGNOSIS — E559 Vitamin D deficiency, unspecified: Secondary | ICD-10-CM

## 2018-07-06 DIAGNOSIS — E1165 Type 2 diabetes mellitus with hyperglycemia: Secondary | ICD-10-CM

## 2018-07-06 DIAGNOSIS — I10 Essential (primary) hypertension: Secondary | ICD-10-CM | POA: Diagnosis not present

## 2018-07-06 DIAGNOSIS — Z6841 Body Mass Index (BMI) 40.0 and over, adult: Secondary | ICD-10-CM

## 2018-07-06 DIAGNOSIS — Z9189 Other specified personal risk factors, not elsewhere classified: Secondary | ICD-10-CM

## 2018-07-06 MED ORDER — AMLODIPINE BESYLATE 2.5 MG PO TABS: 2.5000 mg | ORAL_TABLET | Freq: Every day | ORAL | 0 refills | Status: DC

## 2018-07-06 NOTE — Progress Notes (Signed)
Office: [phone]  /  Fax: [phone]   HPI:   Chief Complaint: OBESITY Jesenya is here to discuss her progress with her obesity treatment plan. She is on the Category 3 plan and is following her eating plan approximately 75 % of the time. She states she is exercising 0 minutes 0 times per week. Katryna voices she has felt "off" taking metformin and she stopped taking it last week and has felt better. She is doing some walking at work.  Her weight is (!) 330 lb (149.7 kg) today and has gained 1 pound since her last visit. She has lost 4 lbs since starting treatment with [name].  Hypertension Noam P Slagel is a 58 y.o. female with hypertension. Amiria's blood pressure is well controlled again today. She denies dizziness or lightheadedness. She is working on weight loss to help control her blood pressure with the goal of decreasing her risk of heart attack and stroke.   Diabetes II with Hyperglycemia Shemika has a diagnosis of diabetes type II. Tonique notes GI side effects as well as feeling "zoned out". Last A1c was 6.1. She has been working on intensive lifestyle modifications including diet, exercise, and weight loss to help control her blood glucose levels.  At risk for cardiovascular disease Jordy is at a higher than average risk for cardiovascular disease due to obesity, hypertension, and diabetes II. She currently denies any chest pain.  Vitamin D Deficiency Zykera has a diagnosis of vitamin D deficiency. She is currently taking prescription Vit D. She notes fatigue and denies nausea, vomiting or muscle weakness.  ASSESSMENT AND PLAN:  Essential hypertension - Plan: amLODipine (NORVASC) 2.5 MG tablet  Vitamin D deficiency  Type 2 diabetes mellitus with hyperglycemia, without long-term current use of insulin (HCC)  At risk for heart disease  Class 3 severe obesity with serious comorbidity and body mass index (BMI) of 50.0 to 59.9 in adult, unspecified obesity type  ([place])  PLAN:  Hypertension We discussed sodium restriction, working on healthy weight loss, and a regular exercise program as the means to achieve improved blood pressure control. Kamariyah agreed with this plan and agreed to follow up as directed. We will continue to monitor her blood pressure as well as her progress with the above lifestyle modifications. Deola agrees to continue taking amlodipine 2.5 mg PO daily #30 and we will refill for 1 month. She will watch for signs of hypotension as she continues her lifestyle modifications. Annelyse agrees to follow up with our clinic in 2 weeks.  Diabetes II with Hyperglycemia Quetzali has been given extensive diabetes education by myself today including ideal fasting and post-prandial blood glucose readings, individual ideal Hgb A1c goals and hypoglycemia prevention. We discussed the importance of good blood sugar control to decrease the likelihood of diabetic complications such as nephropathy, neuropathy, limb loss, blindness, coronary artery disease, and death. We discussed the importance of intensive lifestyle modification including diet, exercise and weight loss as the first line treatment for diabetes. Ellina agrees to continue stop metformin for now, and she agrees to follow up with our clinic in 2 weeks.  Cardiovascular risk counseling Folasade was given extended (15 minutes) coronary artery disease prevention counseling today. She is 58 y.o. female and has risk factors for heart disease including obesity, hypertension, and diabetes II. We discussed intensive lifestyle modifications today with an emphasis on specific weight loss instructions and strategies. Pt was also informed of the importance of increasing exercise and decreasing saturated fats to help prevent heart  disease.  Vitamin D Deficiency Aishwarya was informed that low vitamin D levels contributes to fatigue and are associated with obesity, breast, and colon cancer. Jesyka agrees to continue taking  prescription Vit D @50 ,000 IU every week, no refill needed. She will follow up for routine testing of vitamin D, at least 2-3 times per year. She was informed of the risk of over-replacement of vitamin D and agrees to not increase her dose unless she discusses this with [name] first. Lakhia agrees to follow up with our clinic in 2 weeks.  Obesity Preeya is currently in the action stage of change. As such, her goal is to continue with weight loss efforts She has agreed to keep a food journal with 300-400 calories and 25+ grams of protein at breakfast daily and follow the Category 3 plan Harman has been instructed to work up to a goal of 150 minutes of combined cardio and strengthening exercise per week for weight loss and overall health benefits. We discussed the following Behavioral Modification Strategies today: increasing lean protein intake, increasing vegetables, work on meal planning and easy cooking plans, better snacking choices, and planning for success   Amunique has agreed to follow up with our clinic in 2 weeks. She was informed of the importance of frequent follow up visits to maximize her success with intensive lifestyle modifications for her multiple health conditions.  ALLERGIES: Allergies  Allergen Reactions  . Ace Inhibitors Anaphylaxis    Bowel angioedema  . Cheese Nausea And Vomiting  . Red Dye Nausea And Vomiting  . [name] Flash [Fish Allergy] Nausea And Vomiting    MEDICATIONS: Current Outpatient Medications on File Prior to Visit  Medication Sig Dispense Refill  . carvedilol (COREG) 12.5 MG tablet Take 1 tablet (12.5 mg total) by mouth 2 (two) times daily with a meal. 90 tablet 3  . fluticasone (FLONASE) 50 MCG/ACT nasal spray Place 2 sprays into both nostrils daily. 16 g 6  . furosemide (LASIX) 40 MG tablet TAKE 1 TABLET BY MOUTH  DAILY 90 tablet 3  . glucose blood test strip Check blood sugar twice daily 100 each 11  . loratadine (CLARITIN) 10 MG tablet Take 1 tablet (10 mg  total) by mouth daily. 90 tablet 3  . Multiple Vitamins-Minerals (MULTIVITAMIN ADULT PO) Take 1 tablet daily by mouth. flintstone vitamins    . NONFORMULARY OR COMPOUNDED ITEM Thigh high compression socks  20-30 mmhg    Re- low ext edema 1 each 1  . pantoprazole (PROTONIX) 40 MG tablet Take 2 tablets (80 mg total) by mouth daily. 180 tablet 3  . Vitamin D, Ergocalciferol, (DRISDOL) 1.25 MG (50000 UT) CAPS capsule Take 1 capsule (50,000 Units total) by mouth every 7 (seven) days. 4 capsule 0   No current facility-administered medications on file prior to visit.     PAST MEDICAL HISTORY: Past Medical History:  Diagnosis Date  . Acute on chronic appendicitis s/p lap appy 06/10/2012 06/10/2012   surgery done 06-10-12  . Arthritis    ankle  . Back pain   . Chest pain   . Complication of anesthesia   . Constipation   . Diabetes mellitus    TYPE 2- no meds in several months-was taken off meds -monitoring blood sugar.  Marland Kitchen GERD (gastroesophageal reflux disease)   . History of colon polyps   . Hyperlipidemia   . Hypertension   . Joint pain   . Lactose intolerance   . Morbid obesity - BMI > 50 12/13/2012  . Pneumonia  none recent  . PONV (postoperative nausea and vomiting)   . Poor venous access    "usually difficult stick"  . Prediabetes   . Sinusitis   . SOB (shortness of breath)   . Swelling   . Vitamin D deficiency     PAST SURGICAL HISTORY: Past Surgical History:  Procedure Laterality Date  . ABDOMINAL HYSTERECTOMY    . APPENDECTOMY     2'14  . COLONOSCOPY  04/11/11   5 mm polyp removed but not recovered  . COLONOSCOPY WITH PROPOFOL N/A 05/11/2017   Procedure: COLONOSCOPY WITH PROPOFOL;  Surgeon: Gatha Mayer, MD;  Location: [facility];  Service: Endoscopy;  Laterality: N/A;  . GASTRIC ROUX-EN-Y N/A 02/27/2015   Procedure: LAPAROSCOPIC ROUX-EN-Y GASTRIC BYPASS WITH UPPER ENDOSCOPY;  Surgeon: Johnathan Hausen, MD;  Location: [facility];  Service: General;  Laterality: N/A;  . KNEE  SURGERY Right     KNEE SURGERY   . LAPAROSCOPIC APPENDECTOMY  06/10/2012   Procedure: APPENDECTOMY LAPAROSCOPIC;  Surgeon: Adin Hector, MD;  Location: [facility];  Service: General;  Laterality: N/A;  . UPPER GASTROINTESTINAL ENDOSCOPY  04/14/2006   Normal    SOCIAL HISTORY: Social History   Tobacco Use  . Smoking status: Never Smoker  . Smokeless tobacco: Never Used  Substance Use Topics  . Alcohol use: No    Alcohol/week: 0.0 standard drinks  . Drug use: No    FAMILY HISTORY: Family History  Problem Relation Age of Onset  . Hyperlipidemia Mother   . Hypertension Mother   . Cancer Mother 70       hodgkins lymphoma  . Diabetes Father   . Heart failure Father   . Heart disease Father   . Hyperlipidemia Brother   . Diabetes Paternal Grandmother   . Breast cancer Paternal Aunt        unsure of age  . Breast cancer Paternal Aunt   . Colon cancer Neg Hx   . Heart attack Neg Hx   . Sudden death Neg Hx   . Colon polyps Neg Hx   . Esophageal cancer Neg Hx   . Stomach cancer Neg Hx   . Rectal cancer Neg Hx     ROS: Review of Systems  Constitutional: Positive for malaise/fatigue. Negative for weight loss.  Cardiovascular: Negative for chest pain.  Gastrointestinal: Negative for nausea and vomiting.  Musculoskeletal:       Negative muscle weakness  Neurological: Negative for dizziness.       Negative lightheadedness  Endo/Heme/Allergies:       Negative hypoglycemia    PHYSICAL EXAM: Blood pressure 118/71, pulse (!) 58, temperature 98.1 F (36.7 C), temperature source Oral, height 5\' 5"  (1.651 m), weight (!) 330 lb (149.7 kg), SpO2 100 %. Body mass index is 54.91 kg/m. Physical Exam Vitals signs reviewed.  Constitutional:      Appearance: Normal appearance. She is obese.  Cardiovascular:     Rate and Rhythm: Normal rate.     Pulses: Normal pulses.  Pulmonary:     Effort: Pulmonary effort is normal.     Breath sounds: Normal breath sounds.  Musculoskeletal:  Normal range of motion.  Skin:    General: Skin is warm and dry.  Neurological:     Mental Status: She is alert and oriented to person, place, and time.  Psychiatric:        Mood and Affect: Mood normal.        Behavior: Behavior normal.     RECENT  LABS AND TESTS: BMET    Component Value Date/Time   NA 140 06/03/2018 1404   K 4.1 06/03/2018 1404   CL 102 06/03/2018 1404   CO2 24 06/03/2018 1404   GLUCOSE 94 06/03/2018 1404   GLUCOSE 129 (H) 04/22/2018 1713   BUN 12 06/03/2018 1404   CREATININE 0.90 06/03/2018 1404   CREATININE 0.84 02/21/2014 1022   CALCIUM 8.8 06/03/2018 1404   GFRNONAA 71 06/03/2018 1404   GFRAA 82 06/03/2018 1404   Lab Results  Component Value Date   HGBA1C 6.1 (H) 06/03/2018   HGBA1C 6.1 03/10/2018   HGBA1C 5.9 08/07/2017   HGBA1C 5.8 11/28/2016   HGBA1C 5.7 05/07/2016   Lab Results  Component Value Date   INSULIN 6.9 06/03/2018   CBC    Component Value Date/Time   WBC 3.2 (L) 06/03/2018 1404   WBC 3.6 (L) 04/22/2018 1631   RBC 4.48 06/03/2018 1404   RBC 4.46 04/22/2018 1631   HGB 12.9 06/03/2018 1404   HCT 37.9 06/03/2018 1404   PLT 228 04/22/2018 1631   MCV 85 06/03/2018 1404   MCH 28.8 06/03/2018 1404   MCH 27.8 04/22/2018 1631   MCHC 34.0 06/03/2018 1404   MCHC 30.7 04/22/2018 1631   RDW 13.5 06/03/2018 1404   LYMPHSABS 1.4 06/03/2018 1404   MONOABS 0.4 08/07/2017 0711   EOSABS 0.1 06/03/2018 1404   BASOSABS 0.0 06/03/2018 1404   Iron/TIBC/Ferritin/ %Sat No results found for: IRON, TIBC, FERRITIN, IRONPCTSAT Lipid Panel     Component Value Date/Time   CHOL 183 06/03/2018 1404   TRIG 49 06/03/2018 1404   HDL 68 06/03/2018 1404   CHOLHDL 3 03/10/2018 0814   VLDL 10.4 03/10/2018 0814   LDLCALC 105 (H) 06/03/2018 1404   LDLDIRECT 129.1 10/17/2009 0825   Hepatic Function Panel     Component Value Date/Time   PROT 7.0 06/03/2018 1404   ALBUMIN 4.3 06/03/2018 1404   AST 25 06/03/2018 1404   ALT 24 06/03/2018 1404    ALKPHOS 100 06/03/2018 1404   BILITOT 0.5 06/03/2018 1404   BILIDIR 0.0 05/02/2014 1503      Component Value Date/Time   TSH 1.850 06/03/2018 1404   TSH 2.89 08/07/2017 0711   TSH 3.181 02/21/2014 1038      OBESITY BEHAVIORAL INTERVENTION VISIT  Today's visit was # 4   Starting weight: 334 lbs Starting date: 05/25/2018 Today's weight : 330 lbs Today's date: 07/06/2018 Total lbs lost to date: 4    07/06/2018  Height 5\' 5"  (1.651 m)  Weight 330 lb (149.7 kg) (A)  BMI (Calculated) 54.91  BLOOD PRESSURE - SYSTOLIC 235  BLOOD PRESSURE - DIASTOLIC 71   Body Fat % 66 %     ASK: We discussed the diagnosis of obesity with Mylinda Latina today and Jkayla agreed to give [name] permission to discuss obesity behavioral modification therapy today.  ASSESS: Taheera has the diagnosis of obesity and her BMI today is 54.91 Ezrah is in the action stage of change   ADVISE: Jazzmyn was educated on the multiple health risks of obesity as well as the benefit of weight loss to improve her health. She was advised of the need for long term treatment and the importance of lifestyle modifications to improve her current health and to decrease her risk of future health problems.  AGREE: Multiple dietary modification options and treatment options were discussed and  Calisa agreed to follow the recommendations documented in the above note.  ARRANGE: Jeannett Senior  was educated on the importance of frequent visits to treat obesity as outlined per CMS and USPSTF guidelines and agreed to schedule her next follow up appointment today.  I, Trixie Dredge, am acting as transcriptionist for Ilene Qua, MD  I have reviewed the above documentation for accuracy and completeness, and I agree with the above. - Ilene Qua, MD

## 2018-07-09 ENCOUNTER — Ambulatory Visit (INDEPENDENT_AMBULATORY_CARE_PROVIDER_SITE_OTHER): Payer: 59 | Admitting: *Deleted

## 2018-07-09 ENCOUNTER — Telehealth: Payer: Self-pay | Admitting: *Deleted

## 2018-07-09 DIAGNOSIS — Z23 Encounter for immunization: Secondary | ICD-10-CM | POA: Diagnosis not present

## 2018-07-09 NOTE — Telephone Encounter (Signed)
Can she do a protein drink ?

## 2018-07-09 NOTE — Telephone Encounter (Signed)
Patient stated that she could do protein drink.  Advised to do so while fasting and take medication.

## 2018-07-09 NOTE — Telephone Encounter (Signed)
Patient stated that she is supposed to let you know when she fast.  She is fasting next week and takes blood pressure medication. She will be doing juice and water for 7 days. Is there anything that she needs to do?

## 2018-07-09 NOTE — Progress Notes (Signed)
Patient in today for second shingles vaccine  Vaccine given and patient tolerated well.

## 2018-07-27 ENCOUNTER — Encounter (INDEPENDENT_AMBULATORY_CARE_PROVIDER_SITE_OTHER): Payer: Self-pay

## 2018-07-28 ENCOUNTER — Ambulatory Visit (INDEPENDENT_AMBULATORY_CARE_PROVIDER_SITE_OTHER): Payer: 59 | Admitting: Family Medicine

## 2018-07-29 ENCOUNTER — Encounter (INDEPENDENT_AMBULATORY_CARE_PROVIDER_SITE_OTHER): Payer: Self-pay

## 2018-08-17 ENCOUNTER — Encounter (INDEPENDENT_AMBULATORY_CARE_PROVIDER_SITE_OTHER): Payer: Self-pay | Admitting: Family Medicine

## 2018-08-25 ENCOUNTER — Ambulatory Visit (INDEPENDENT_AMBULATORY_CARE_PROVIDER_SITE_OTHER): Payer: 59 | Admitting: Bariatrics

## 2018-08-25 ENCOUNTER — Encounter (INDEPENDENT_AMBULATORY_CARE_PROVIDER_SITE_OTHER): Payer: Self-pay | Admitting: Bariatrics

## 2018-08-25 ENCOUNTER — Other Ambulatory Visit: Payer: Self-pay

## 2018-08-25 DIAGNOSIS — E1165 Type 2 diabetes mellitus with hyperglycemia: Secondary | ICD-10-CM

## 2018-08-25 DIAGNOSIS — I10 Essential (primary) hypertension: Secondary | ICD-10-CM

## 2018-08-25 DIAGNOSIS — E559 Vitamin D deficiency, unspecified: Secondary | ICD-10-CM | POA: Diagnosis not present

## 2018-08-25 DIAGNOSIS — Z6841 Body Mass Index (BMI) 40.0 and over, adult: Secondary | ICD-10-CM

## 2018-08-25 DIAGNOSIS — E66813 Obesity, class 3: Secondary | ICD-10-CM

## 2018-08-25 MED ORDER — CARVEDILOL 12.5 MG PO TABS: 12.5000 mg | ORAL_TABLET | Freq: Two times a day (BID) | ORAL | 0 refills | Status: DC

## 2018-08-26 NOTE — Progress Notes (Signed)
Office: [phone]  /  Fax: [phone] TeleHealth Visit:  [name] has verbally consented to this TeleHealth visit today. The patient is located at home, the provider is located at the [facility] office. The participants in this visit include the listed provider and patient and any and all parties involved. The visit was conducted today via FaceTime.  HPI:   Chief Complaint: OBESITY Tammy [name] is here to discuss her progress with her obesity treatment plan. She is on the Category 3 plan and is following her eating plan approximately 10 to 15 % of the time. She states she is walking 45 minutes 2 times per week. Tammy [name] thinks that she has gained 7 to 8 pounds. She has been doing some stress eating. She thinks that she is retaining fluid. We were unable to weigh the patient today for this TeleHealth visit. She feels as if she has gained weight since her last visit. She has lost 0 lbs since starting treatment with [name].  Diabetes II with hyperglycemia Tammy [name] has a diagnosis of diabetes type II (had metformin). Tammy [name] states fasting BGs range in the 90's and 2 hour post prandial BGs range betwee 110 and 120's. Tammy [name] denies any hypoglycemic episodes. Last A1c was at 6.1 and last insulin level was at 6.9 She has been working on intensive lifestyle modifications including diet, exercise, and weight loss to help control her blood glucose levels.  Vitamin D deficiency Tammy [name] has a diagnosis of vitamin D deficiency. She is is taking vit D and denies nausea, vomiting or muscle weakness.  Hypertension Tammy [name] is a 58 y.o. female with hypertension. Her blood pressure is 138/78. Tammy [name] denies lightheadedness. She is working weight loss to help control her blood pressure with the goal of decreasing her risk of heart attack and stroke. Tammy [name] blood pressure is currently controlled.  ASSESSMENT AND PLAN:  Type 2 diabetes mellitus with hyperglycemia, without  long-term current use of insulin (HCC)  Vitamin D deficiency  Essential hypertension - Plan: carvedilol (COREG) 12.5 MG tablet  Class 3 severe obesity with serious comorbidity and body mass index (BMI) of 50.0 to 59.9 in adult, unspecified obesity type ([place])  PLAN:  Diabetes II with hyperglycemia Tammy [name] has been given extensive diabetes education by myself today including ideal fasting and post-prandial blood glucose readings, individual ideal Hgb A1c goals and hypoglycemia prevention. We discussed the importance of good blood sugar control to decrease the likelihood of diabetic complications such as nephropathy, neuropathy, limb loss, blindness, coronary artery disease, and death. We discussed the importance of intensive lifestyle modification including diet, exercise and weight loss as the first line treatment for diabetes. Tammy [name] will continue to work on decreasing simple carbohydrates in her diet and increasing lean protein. She will continue to work on exercise and she will resume metformin in the morning. Suzette agreed to follow up at the agreed upon time.  Vitamin D Deficiency Tammy [name] was informed that low vitamin D levels contributes to fatigue and are associated with obesity, breast, and colon cancer. She will continue to take prescription Vit D @50 ,000 IU every week and will follow up for routine testing of vitamin D, at least 2-3 times per year. She was informed of the risk of over-replacement of vitamin D and agrees to not increase her dose unless she discusses this with [name] first.  Hypertension We discussed sodium restriction, working on healthy weight loss, and a regular exercise program as the means to achieve improved blood pressure  control. Tammy [name] agreed with this plan and agreed to follow up as directed. We will continue to monitor her blood pressure as well as her progress with the above lifestyle modifications. She agrees to take Carvedilol 12.5 mg 2 times daily with meals #60 with  no refills and she will start Lasix daily until fluid goes down (currently taking every other day). Tammy [name] will watch for signs of hypotension as she continues her lifestyle modifications.  Obesity [name] is currently in the action stage of change. As such, her goal is to continue with weight loss efforts She has agreed to keep a food journal with 300 to 400 calories and 25+ grams of protein at breakfast daily and follow the Category 3 plan [name] will continue exercising and staying active for weight loss and overall health benefits. We discussed the following Behavioral Modification Strategies today: increase H2O intake, no skipping meals, keeping healthy foods in the home, increasing lean protein intake, decreasing simple carbohydrates, increasing vegetables, decrease eating out and work on meal planning and easy cooking plans [name] will weigh herself at home before each visit. Tammy [name] will decrease processed dinners.  Tammy [name] has agreed to follow up with our clinic in 2 weeks. She was informed of the importance of frequent follow up visits to maximize her success with intensive lifestyle modifications for her multiple health conditions.  ALLERGIES: Allergies  Allergen Reactions   Ace Inhibitors Anaphylaxis    Bowel angioedema   Cheese Nausea And Vomiting   Red Dye Nausea And Vomiting   Tuna [Fish Allergy] Nausea And Vomiting    MEDICATIONS: Current Outpatient Medications on File Prior to Visit  Medication Sig Dispense Refill   amLODipine (NORVASC) 2.5 MG tablet Take 1 tablet (2.5 mg total) by mouth daily. 30 tablet 0   fluticasone (FLONASE) 50 MCG/ACT nasal spray Place 2 sprays into both nostrils daily. 16 g 6   furosemide (LASIX) 40 MG tablet TAKE 1 TABLET BY MOUTH  DAILY 90 tablet 3   glucose blood test strip Check blood sugar twice daily 100 each 11   loratadine (CLARITIN) 10 MG tablet Take 1 tablet (10 mg total) by mouth daily. 90 tablet 3   Multiple Vitamins-Minerals  (MULTIVITAMIN ADULT PO) Take 1 tablet daily by mouth. flintstone vitamins     NONFORMULARY OR COMPOUNDED ITEM Thigh high compression socks  20-30 mmhg    Re- low ext edema 1 each 1   pantoprazole (PROTONIX) 40 MG tablet Take 2 tablets (80 mg total) by mouth daily. 180 tablet 3   Vitamin D, Ergocalciferol, (DRISDOL) 1.25 MG (50000 UT) CAPS capsule Take 1 capsule (50,000 Units total) by mouth every 7 (seven) days. 4 capsule 0   No current facility-administered medications on file prior to visit.     PAST MEDICAL HISTORY: Past Medical History:  Diagnosis Date   Acute on chronic appendicitis s/p lap appy 06/10/2012 06/10/2012   surgery done 06-10-12   Arthritis    ankle   Back pain    Chest pain    Complication of anesthesia    Constipation    Diabetes mellitus    TYPE 2- no meds in several months-was taken off meds -monitoring blood sugar.   GERD (gastroesophageal reflux disease)    History of colon polyps    Hyperlipidemia    Hypertension    Joint pain    Lactose intolerance    Morbid obesity - BMI > 50 12/13/2012   Pneumonia    none recent   PONV (postoperative nausea  and vomiting)    Poor venous access    "usually difficult stick"   Prediabetes    Sinusitis    SOB (shortness of breath)    Swelling    Vitamin D deficiency     PAST SURGICAL HISTORY: Past Surgical History:  Procedure Laterality Date   ABDOMINAL HYSTERECTOMY     APPENDECTOMY     2'14   COLONOSCOPY  04/11/11   5 mm polyp removed but not recovered   COLONOSCOPY WITH PROPOFOL N/A 05/11/2017   Procedure: COLONOSCOPY WITH PROPOFOL;  Surgeon: Gatha Mayer, MD;  Location: [facility];  Service: Endoscopy;  Laterality: N/A;   GASTRIC ROUX-EN-Y N/A 02/27/2015   Procedure: LAPAROSCOPIC ROUX-EN-Y GASTRIC BYPASS WITH UPPER ENDOSCOPY;  Surgeon: Johnathan Hausen, MD;  Location: [facility];  Service: General;  Laterality: N/A;   KNEE SURGERY Right     KNEE SURGERY    LAPAROSCOPIC APPENDECTOMY   06/10/2012   Procedure: APPENDECTOMY LAPAROSCOPIC;  Surgeon: Adin Hector, MD;  Location: [facility];  Service: General;  Laterality: N/A;   UPPER GASTROINTESTINAL ENDOSCOPY  04/14/2006   Normal    SOCIAL HISTORY: Social History   Tobacco Use   Smoking status: Never Smoker   Smokeless tobacco: Never Used  Substance Use Topics   Alcohol use: No    Alcohol/week: 0.0 standard drinks   Drug use: No    FAMILY HISTORY: Family History  Problem Relation Age of Onset   Hyperlipidemia Mother    Hypertension Mother    Cancer Mother 93       hodgkins lymphoma   Diabetes Father    Heart failure Father    Heart disease Father    Hyperlipidemia Brother    Diabetes Paternal Grandmother    Breast cancer Paternal Aunt        unsure of age   Breast cancer Paternal Aunt    Colon cancer Neg Hx    Heart attack Neg Hx    Sudden death Neg Hx    Colon polyps Neg Hx    Esophageal cancer Neg Hx    Stomach cancer Neg Hx    Rectal cancer Neg Hx     ROS: Review of Systems  Constitutional: Negative for weight loss.  Gastrointestinal: Negative for nausea and vomiting.  Musculoskeletal:       Negative for muscle weakness  Neurological:       Negative for lightheadedness  Endo/Heme/Allergies:       Negative for hypoglycemia    PHYSICAL EXAM: Pt in no acute distress  RECENT LABS AND TESTS: BMET    Component Value Date/Time   NA 140 06/03/2018 1404   K 4.1 06/03/2018 1404   CL 102 06/03/2018 1404   CO2 24 06/03/2018 1404   GLUCOSE 94 06/03/2018 1404   GLUCOSE 129 (H) 04/22/2018 1713   BUN 12 06/03/2018 1404   CREATININE 0.90 06/03/2018 1404   CREATININE 0.84 02/21/2014 1022   CALCIUM 8.8 06/03/2018 1404   GFRNONAA 71 06/03/2018 1404   GFRAA 82 06/03/2018 1404   Lab Results  Component Value Date   HGBA1C 6.1 (H) 06/03/2018   HGBA1C 6.1 03/10/2018   HGBA1C 5.9 08/07/2017   HGBA1C 5.8 11/28/2016   HGBA1C 5.7 05/07/2016   Lab Results  Component Value Date    INSULIN 6.9 06/03/2018   CBC    Component Value Date/Time   WBC 3.2 (L) 06/03/2018 1404   WBC 3.6 (L) 04/22/2018 1631   RBC 4.48 06/03/2018 1404   RBC 4.46  04/22/2018 1631   HGB 12.9 06/03/2018 1404   HCT 37.9 06/03/2018 1404   PLT 228 04/22/2018 1631   MCV 85 06/03/2018 1404   MCH 28.8 06/03/2018 1404   MCH 27.8 04/22/2018 1631   MCHC 34.0 06/03/2018 1404   MCHC 30.7 04/22/2018 1631   RDW 13.5 06/03/2018 1404   LYMPHSABS 1.4 06/03/2018 1404   MONOABS 0.4 08/07/2017 0711   EOSABS 0.1 06/03/2018 1404   BASOSABS 0.0 06/03/2018 1404   Iron/TIBC/Ferritin/ %Sat No results found for: IRON, TIBC, FERRITIN, IRONPCTSAT Lipid Panel     Component Value Date/Time   CHOL 183 06/03/2018 1404   TRIG 49 06/03/2018 1404   HDL 68 06/03/2018 1404   CHOLHDL 3 03/10/2018 0814   VLDL 10.4 03/10/2018 0814   LDLCALC 105 (H) 06/03/2018 1404   LDLDIRECT 129.1 10/17/2009 0825   Hepatic Function Panel     Component Value Date/Time   PROT 7.0 06/03/2018 1404   ALBUMIN 4.3 06/03/2018 1404   AST 25 06/03/2018 1404   ALT 24 06/03/2018 1404   ALKPHOS 100 06/03/2018 1404   BILITOT 0.5 06/03/2018 1404   BILIDIR 0.0 05/02/2014 1503      Component Value Date/Time   TSH 1.850 06/03/2018 1404   TSH 2.89 08/07/2017 0711   TSH 3.181 02/21/2014 1038     Ref. Range 06/03/2018 14:04  Vitamin D, 25-Hydroxy Latest Ref Range: 30.0 - 100.0 ng/mL 6.8 (L)    I, Doreene Nest, am acting as Location manager for [employer]. Owens Shark, DO  I have reviewed the above documentation for accuracy and completeness, and I agree with the above. -Jearld Lesch, DO

## 2018-08-30 DIAGNOSIS — Z6841 Body Mass Index (BMI) 40.0 and over, adult: Secondary | ICD-10-CM

## 2018-09-08 ENCOUNTER — Other Ambulatory Visit: Payer: Self-pay

## 2018-09-08 ENCOUNTER — Encounter (INDEPENDENT_AMBULATORY_CARE_PROVIDER_SITE_OTHER): Payer: Self-pay | Admitting: Family Medicine

## 2018-09-08 ENCOUNTER — Ambulatory Visit (INDEPENDENT_AMBULATORY_CARE_PROVIDER_SITE_OTHER): Payer: 59 | Admitting: Family Medicine

## 2018-09-08 DIAGNOSIS — E1165 Type 2 diabetes mellitus with hyperglycemia: Secondary | ICD-10-CM

## 2018-09-08 DIAGNOSIS — E559 Vitamin D deficiency, unspecified: Secondary | ICD-10-CM

## 2018-09-08 DIAGNOSIS — E66813 Obesity, class 3: Secondary | ICD-10-CM

## 2018-09-08 DIAGNOSIS — Z6841 Body Mass Index (BMI) 40.0 and over, adult: Secondary | ICD-10-CM

## 2018-09-08 MED ORDER — VITAMIN D (ERGOCALCIFEROL) 1.25 MG (50000 UNIT) PO CAPS: 50000.0000 [IU] | ORAL_CAPSULE | ORAL | 0 refills | Status: DC

## 2018-09-09 NOTE — Progress Notes (Signed)
Office: [phone]  /  Fax: [phone] TeleHealth Visit:  Tammy [name] has verbally consented to this TeleHealth visit today. The patient is located at home, the provider is located at the [employer] [facility] office. The participants in this visit include the listed provider and patient. The visit was conducted today via face time.  HPI:   Chief Complaint: OBESITY Tammy [name] is here to discuss her progress with her obesity treatment plan. She is on the keep a food journal with 300-400 calories and 25+ grams of protein at breakfast daily and follow the Category 3 plan and is following her eating plan approximately 75 % of the time. She states she is exercising 0 minutes 0 times per week. Tammy [name] has been very tired with working and trying to maintain social distancing. She has had more work at work secondary to less people at work. She has had trouble finding bread on the plan.  We were unable to weigh the patient today for this TeleHealth visit. She feels as if she has maintained her weight since her last visit. She has lost 4 lbs since starting treatment with [name].  Vitamin D Deficiency Tammy [name] has a diagnosis of vitamin D deficiency. She is currently taking prescription Vit D. She notes fatigue and denies nausea, vomiting or muscle weakness.  Diabetes II with Hyperglycemia Tammy [name] has a diagnosis of diabetes type II. Tammy [name] is not on medications. She restarted metformin but had severe significant abdominal pain, and only occasional diarrhea. She states fasting BGs range in 90's in the morning and range between 100 and 120 in the afternoon. Last A1c was 6.1. She has been working on intensive lifestyle modifications including diet, exercise, and weight loss to help control her blood glucose levels.  ASSESSMENT AND PLAN:  Vitamin D deficiency - Plan: Vitamin D, Ergocalciferol, (DRISDOL) 1.25 MG (50000 UT) CAPS capsule  Type 2 diabetes mellitus with hyperglycemia, without long-term  current use of insulin (HCC)  Class 3 severe obesity with serious comorbidity and body mass index (BMI) of 50.0 to 59.9 in adult, unspecified obesity type ([name])  PLAN:  Vitamin D Deficiency Tammy [name] was informed that low vitamin D levels contributes to fatigue and are associated with obesity, breast, and colon cancer. [name] agrees to continue taking prescription Vit D @50 ,000 IU every week #4 and we will refill for 1 month. She will follow up for routine testing of vitamin D, at least 2-3 times per year. She was informed of the risk of over-replacement of vitamin D and agrees to not increase her dose unless she discusses this with [name] first. Tammy [name] agrees to follow up with our clinic in 2 weeks.  Diabetes II with Hyperglycemia Tammy [name] has been given extensive diabetes education by myself today including ideal fasting and post-prandial blood glucose readings, individual ideal Hgb A1c goals and hypoglycemia prevention. We discussed the importance of good blood sugar control to decrease the likelihood of diabetic complications such as nephropathy, neuropathy, limb loss, blindness, coronary artery disease, and death. We discussed the importance of intensive lifestyle modification including diet, exercise and weight loss as the first line treatment for diabetes. Tammy [name] agrees to continue taking metformin, no refill needed. Tammy [name] agrees to follow up with our clinic in 2 weeks.  Obesity [name] is currently in the action stage of change. As such, her goal is to continue with weight loss efforts She has agreed to follow the Category 3 plan [name] has been instructed to work up to a goal of 150 minutes  of combined cardio and strengthening exercise per week for weight loss and overall health benefits. We discussed the following Behavioral Modification Strategies today: increasing lean protein intake, increasing vegetables and work on meal planning and easy cooking plans, keeping healthy foods in the home, and  planning for success   Tammy [name] has agreed to follow up with our clinic in 2 weeks. She was informed of the importance of frequent follow up visits to maximize her success with intensive lifestyle modifications for her multiple health conditions.  ALLERGIES: Allergies  Allergen Reactions  . Ace Inhibitors Anaphylaxis    Bowel angioedema  . Cheese Nausea And Vomiting  . Red Dye Nausea And Vomiting  . Tammy [name] [Fish Allergy] Nausea And Vomiting    MEDICATIONS: Current Outpatient Medications on File Prior to Visit  Medication Sig Dispense Refill  . amLODipine (NORVASC) 2.5 MG tablet Take 1 tablet (2.5 mg total) by mouth daily. 30 tablet 0  . carvedilol (COREG) 12.5 MG tablet Take 1 tablet (12.5 mg total) by mouth 2 (two) times daily with a meal. 60 tablet 0  . fluticasone (FLONASE) 50 MCG/ACT nasal spray Place 2 sprays into both nostrils daily. 16 g 6  . furosemide (LASIX) 40 MG tablet TAKE 1 TABLET BY MOUTH  DAILY 90 tablet 3  . glucose blood test strip Check blood sugar twice daily 100 each 11  . loratadine (CLARITIN) 10 MG tablet Take 1 tablet (10 mg total) by mouth daily. 90 tablet 3  . Multiple Vitamins-Minerals (MULTIVITAMIN ADULT PO) Take 1 tablet daily by mouth. flintstone vitamins    . NONFORMULARY OR COMPOUNDED ITEM Thigh high compression socks  20-30 mmhg    Re- low ext edema 1 each 1  . pantoprazole (PROTONIX) 40 MG tablet Take 2 tablets (80 mg total) by mouth daily. 180 tablet 3   No current facility-administered medications on file prior to visit.     PAST MEDICAL HISTORY: Past Medical History:  Diagnosis Date  . Acute on chronic appendicitis s/p lap appy 06/10/2012 06/10/2012   surgery done 06-10-12  . Arthritis    ankle  . Back pain   . Chest pain   . Complication of anesthesia   . Constipation   . Diabetes mellitus    TYPE 2- no meds in several months-was taken off meds -monitoring blood sugar.  Tammy [name] GERD (gastroesophageal reflux disease)   . History of colon polyps   .  Hyperlipidemia   . Hypertension   . Joint pain   . Lactose intolerance   . Morbid obesity - BMI > 50 12/13/2012  . Pneumonia    none recent  . PONV (postoperative nausea and vomiting)   . Poor venous access    "usually difficult stick"  . Prediabetes   . Sinusitis   . SOB (shortness of breath)   . Swelling   . Vitamin D deficiency     PAST SURGICAL HISTORY: Past Surgical History:  Procedure Laterality Date  . ABDOMINAL HYSTERECTOMY    . APPENDECTOMY     2'14  . COLONOSCOPY  04/11/11   5 mm polyp removed but not recovered  . COLONOSCOPY WITH PROPOFOL N/A 05/11/2017   Procedure: COLONOSCOPY WITH PROPOFOL;  Surgeon: Gatha Mayer, MD;  Location: [facility];  Service: Endoscopy;  Laterality: N/A;  . GASTRIC ROUX-EN-Y N/A 02/27/2015   Procedure: LAPAROSCOPIC ROUX-EN-Y GASTRIC BYPASS WITH UPPER ENDOSCOPY;  Surgeon: Johnathan Hausen, MD;  Location: [facility];  Service: General;  Laterality: N/A;  . KNEE SURGERY Right  KNEE SURGERY   . LAPAROSCOPIC APPENDECTOMY  06/10/2012   Procedure: APPENDECTOMY LAPAROSCOPIC;  Surgeon: Adin Hector, MD;  Location: [facility];  Service: General;  Laterality: N/A;  . UPPER GASTROINTESTINAL ENDOSCOPY  04/14/2006   Normal    SOCIAL HISTORY: Social History   Tobacco Use  . Smoking status: Never Smoker  . Smokeless tobacco: Never Used  Substance Use Topics  . Alcohol use: No    Alcohol/week: 0.0 standard drinks  . Drug use: No    FAMILY HISTORY: Family History  Problem Relation Age of Onset  . Hyperlipidemia Mother   . Hypertension Mother   . Cancer Mother 68       hodgkins lymphoma  . Diabetes Father   . Heart failure Father   . Heart disease Father   . Hyperlipidemia Brother   . Diabetes Paternal Grandmother   . Breast cancer Paternal Aunt        unsure of age  . Breast cancer Paternal Aunt   . Colon cancer Neg Hx   . Heart attack Neg Hx   . Sudden death Neg Hx   . Colon polyps Neg Hx   . Esophageal cancer Neg Hx   . Stomach  cancer Neg Hx   . Rectal cancer Neg Hx     ROS: Review of Systems  Constitutional: Positive for malaise/fatigue. Negative for weight loss.  Gastrointestinal: Positive for abdominal pain and diarrhea (occasional). Negative for nausea and vomiting.  Musculoskeletal:       Negative muscle weakness  Endo/Heme/Allergies:       Negative hypoglycemia    PHYSICAL EXAM: Pt in no acute distress  RECENT LABS AND TESTS: BMET    Component Value Date/Time   NA 140 06/03/2018 1404   K 4.1 06/03/2018 1404   CL 102 06/03/2018 1404   CO2 24 06/03/2018 1404   GLUCOSE 94 06/03/2018 1404   GLUCOSE 129 (H) 04/22/2018 1713   BUN 12 06/03/2018 1404   CREATININE 0.90 06/03/2018 1404   CREATININE 0.84 02/21/2014 1022   CALCIUM 8.8 06/03/2018 1404   GFRNONAA 71 06/03/2018 1404   GFRAA 82 06/03/2018 1404   Lab Results  Component Value Date   HGBA1C 6.1 (H) 06/03/2018   HGBA1C 6.1 03/10/2018   HGBA1C 5.9 08/07/2017   HGBA1C 5.8 11/28/2016   HGBA1C 5.7 05/07/2016   Lab Results  Component Value Date   INSULIN 6.9 06/03/2018   CBC    Component Value Date/Time   WBC 3.2 (L) 06/03/2018 1404   WBC 3.6 (L) 04/22/2018 1631   RBC 4.48 06/03/2018 1404   RBC 4.46 04/22/2018 1631   HGB 12.9 06/03/2018 1404   HCT 37.9 06/03/2018 1404   PLT 228 04/22/2018 1631   MCV 85 06/03/2018 1404   MCH 28.8 06/03/2018 1404   MCH 27.8 04/22/2018 1631   MCHC 34.0 06/03/2018 1404   MCHC 30.7 04/22/2018 1631   RDW 13.5 06/03/2018 1404   LYMPHSABS 1.4 06/03/2018 1404   MONOABS 0.4 08/07/2017 0711   EOSABS 0.1 06/03/2018 1404   BASOSABS 0.0 06/03/2018 1404   Iron/TIBC/Ferritin/ %Sat No results found for: IRON, TIBC, FERRITIN, IRONPCTSAT Lipid Panel     Component Value Date/Time   CHOL 183 06/03/2018 1404   TRIG 49 06/03/2018 1404   HDL 68 06/03/2018 1404   CHOLHDL 3 03/10/2018 0814   VLDL 10.4 03/10/2018 0814   LDLCALC 105 (H) 06/03/2018 1404   LDLDIRECT 129.1 10/17/2009 0825   Hepatic Function  Panel  Component Value Date/Time   PROT 7.0 06/03/2018 1404   ALBUMIN 4.3 06/03/2018 1404   AST 25 06/03/2018 1404   ALT 24 06/03/2018 1404   ALKPHOS 100 06/03/2018 1404   BILITOT 0.5 06/03/2018 1404   BILIDIR 0.0 05/02/2014 1503      Component Value Date/Time   TSH 1.850 06/03/2018 1404   TSH 2.89 08/07/2017 0711   TSH 3.181 02/21/2014 1038      I, Trixie Dredge, am acting as transcriptionist for Ilene Qua, MD  I have reviewed the above documentation for accuracy and completeness, and I agree with the above. - Ilene Qua, MD

## 2018-09-22 ENCOUNTER — Encounter (INDEPENDENT_AMBULATORY_CARE_PROVIDER_SITE_OTHER): Payer: Self-pay | Admitting: Family Medicine

## 2018-09-22 ENCOUNTER — Other Ambulatory Visit: Payer: Self-pay

## 2018-09-22 ENCOUNTER — Ambulatory Visit (INDEPENDENT_AMBULATORY_CARE_PROVIDER_SITE_OTHER): Payer: 59 | Admitting: Family Medicine

## 2018-09-22 DIAGNOSIS — E66813 Obesity, class 3: Secondary | ICD-10-CM

## 2018-09-22 DIAGNOSIS — E119 Type 2 diabetes mellitus without complications: Secondary | ICD-10-CM

## 2018-09-22 DIAGNOSIS — I10 Essential (primary) hypertension: Secondary | ICD-10-CM

## 2018-09-22 DIAGNOSIS — Z6841 Body Mass Index (BMI) 40.0 and over, adult: Secondary | ICD-10-CM

## 2018-09-23 MED ORDER — CARVEDILOL 12.5 MG PO TABS: 12.5000 mg | ORAL_TABLET | Freq: Two times a day (BID) | ORAL | 0 refills | Status: DC

## 2018-09-23 NOTE — Progress Notes (Signed)
Office: [phone]  /  Fax: [phone] TeleHealth Visit:  Tammy [name] has verbally consented to this TeleHealth visit today. The patient is located at home, the provider is located at the [employer] [facility] office. The participants in this visit include the listed provider and patient. The visit was conducted today via face time.  HPI:   Chief Complaint: OBESITY Tammy [name] is here to discuss her progress with her obesity treatment plan. She is on the Category 3 plan and is following her eating plan approximately 0 % of the time (unsure). She states she is walking for 35 minutes 1 time per week. Tammy [name] reports her younger cousin was found dead in her house last week (only 58 years old). She did some comfort and emotional eating after finding out. She did get back on track to following the meal plan more strictly.  We were unable to weigh the patient today for this TeleHealth visit. She feels as if she has lost weight since her last visit. She has lost 4 lbs since starting treatment with [name].  Hypertension Tammy [name] is a 58 y.o. female with hypertension. Jaryn's blood pressure was previously controlled. She denies chest pain, chest pressure, or headaches. She is working on weight loss to help control her blood pressure with the goal of decreasing her risk of heart attack and stroke.   Diabetes II with Hyperglycemia Tammy [name] has a diagnosis of diabetes type II. Contina notes occasional carbohydrate cravings. She is not on metformin. She states her BGs average of 98 in the morning and BGs average of 125 in the afternoon. She denies hypoglycemia. Last A1c was 6.1. She has been working on intensive lifestyle modifications including diet, exercise, and weight loss to help control her blood glucose levels.  ASSESSMENT AND PLAN:  Essential hypertension - Plan: carvedilol (COREG) 12.5 MG tablet  Type 2 diabetes mellitus without complication, without long-term current use of insulin  (HCC)  Class 3 severe obesity with serious comorbidity and body mass index (BMI) of 50.0 to 59.9 in adult, unspecified obesity type ([place])  PLAN:  Hypertension We discussed sodium restriction, working on healthy weight loss, and a regular exercise program as the means to achieve improved blood pressure control. Bisma agreed with this plan and agreed to follow up as directed. We will continue to monitor her blood pressure as well as her progress with the above lifestyle modifications. Kiyla agrees to continue taking Coreg 12.5 mg PO BID #60 and we will refill for 1 month. She will watch for signs of hypotension as she continues her lifestyle modifications. Tammy [name] agrees to follow up with our clinic in 2 weeks.  Diabetes II with Hyperglycemia Tammy [name] has been given extensive diabetes education by myself today including ideal fasting and post-prandial blood glucose readings, individual ideal Hgb A1c goals and hypoglycemia prevention. We discussed the importance of good blood sugar control to decrease the likelihood of diabetic complications such as nephropathy, neuropathy, limb loss, blindness, coronary artery disease, and death. We discussed the importance of intensive lifestyle modification including diet, exercise and weight loss as the first line treatment for diabetes. We will repeat labs at her first in person appointment. Tammy [name] agrees to follow up with our clinic in 2 weeks.  Obesity Tammy [name] is currently in the action stage of change. As such, her goal is to continue with weight loss efforts She has agreed to follow the Category 3 plan Tammy [name] has been instructed to work up to a goal of 150 minutes  of combined cardio and strengthening exercise per week for weight loss and overall health benefits. We discussed the following Behavioral Modification Strategies today: increasing lean protein intake, increasing vegetables and work on meal planning and easy cooking plans, keeping healthy foods in the  home, better snacking choices, and planning for success   Tammy [name] has agreed to follow up with our clinic in 2 weeks. She was informed of the importance of frequent follow up visits to maximize her success with intensive lifestyle modifications for her multiple health conditions.  ALLERGIES: Allergies  Allergen Reactions  . Ace Inhibitors Anaphylaxis    Bowel angioedema  . Cheese Nausea And Vomiting  . Red Dye Nausea And Vomiting  . [name] Flash [Fish Allergy] Nausea And Vomiting    MEDICATIONS: Current Outpatient Medications on File Prior to Visit  Medication Sig Dispense Refill  . amLODipine (NORVASC) 2.5 MG tablet Take 1 tablet (2.5 mg total) by mouth daily. 30 tablet 0  . carvedilol (COREG) 12.5 MG tablet Take 1 tablet (12.5 mg total) by mouth 2 (two) times daily with a meal. 60 tablet 0  . fluticasone (FLONASE) 50 MCG/ACT nasal spray Place 2 sprays into both nostrils daily. 16 g 6  . furosemide (LASIX) 40 MG tablet TAKE 1 TABLET BY MOUTH  DAILY 90 tablet 3  . glucose blood test strip Check blood sugar twice daily 100 each 11  . loratadine (CLARITIN) 10 MG tablet Take 1 tablet (10 mg total) by mouth daily. 90 tablet 3  . Multiple Vitamins-Minerals (MULTIVITAMIN ADULT PO) Take 1 tablet daily by mouth. flintstone vitamins    . NONFORMULARY OR COMPOUNDED ITEM Thigh high compression socks  20-30 mmhg    Re- low ext edema 1 each 1  . pantoprazole (PROTONIX) 40 MG tablet Take 2 tablets (80 mg total) by mouth daily. 180 tablet 3  . Vitamin D, Ergocalciferol, (DRISDOL) 1.25 MG (50000 UT) CAPS capsule Take 1 capsule (50,000 Units total) by mouth every 7 (seven) days. 4 capsule 0   No current facility-administered medications on file prior to visit.     PAST MEDICAL HISTORY: Past Medical History:  Diagnosis Date  . Acute on chronic appendicitis s/p lap appy 06/10/2012 06/10/2012   surgery done 06-10-12  . Arthritis    ankle  . Back pain   . Chest pain   . Complication of anesthesia   .  Constipation   . Diabetes mellitus    TYPE 2- no meds in several months-was taken off meds -monitoring blood sugar.  Marland Kitchen GERD (gastroesophageal reflux disease)   . History of colon polyps   . Hyperlipidemia   . Hypertension   . Joint pain   . Lactose intolerance   . Morbid obesity - BMI > 50 12/13/2012  . Pneumonia    none recent  . PONV (postoperative nausea and vomiting)   . Poor venous access    "usually difficult stick"  . Prediabetes   . Sinusitis   . SOB (shortness of breath)   . Swelling   . Vitamin D deficiency     PAST SURGICAL HISTORY: Past Surgical History:  Procedure Laterality Date  . ABDOMINAL HYSTERECTOMY    . APPENDECTOMY     2'14  . COLONOSCOPY  04/11/11   5 mm polyp removed but not recovered  . COLONOSCOPY WITH PROPOFOL N/A 05/11/2017   Procedure: COLONOSCOPY WITH PROPOFOL;  Surgeon: Gatha Mayer, MD;  Location: [facility];  Service: Endoscopy;  Laterality: N/A;  . GASTRIC ROUX-EN-Y N/A 02/27/2015  Procedure: LAPAROSCOPIC ROUX-EN-Y GASTRIC BYPASS WITH UPPER ENDOSCOPY;  Surgeon: Johnathan Hausen, MD;  Location: [facility];  Service: General;  Laterality: N/A;  . KNEE SURGERY Right     KNEE SURGERY   . LAPAROSCOPIC APPENDECTOMY  06/10/2012   Procedure: APPENDECTOMY LAPAROSCOPIC;  Surgeon: Adin Hector, MD;  Location: [facility];  Service: General;  Laterality: N/A;  . UPPER GASTROINTESTINAL ENDOSCOPY  04/14/2006   Normal    SOCIAL HISTORY: Social History   Tobacco Use  . Smoking status: Never Smoker  . Smokeless tobacco: Never Used  Substance Use Topics  . Alcohol use: No    Alcohol/week: 0.0 standard drinks  . Drug use: No    FAMILY HISTORY: Family History  Problem Relation Age of Onset  . Hyperlipidemia Mother   . Hypertension Mother   . Cancer Mother 57       hodgkins lymphoma  . Diabetes Father   . Heart failure Father   . Heart disease Father   . Hyperlipidemia Brother   . Diabetes Paternal Grandmother   . Breast cancer Paternal Aunt         unsure of age  . Breast cancer Paternal Aunt   . Colon cancer Neg Hx   . Heart attack Neg Hx   . Sudden death Neg Hx   . Colon polyps Neg Hx   . Esophageal cancer Neg Hx   . Stomach cancer Neg Hx   . Rectal cancer Neg Hx     ROS: Review of Systems  Constitutional: Positive for weight loss.  Cardiovascular: Negative for chest pain.       Negative chest pressure  Neurological: Negative for headaches.  Endo/Heme/Allergies:       Negative hypoglycemia    PHYSICAL EXAM: Pt in no acute distress  RECENT LABS AND TESTS: BMET    Component Value Date/Time   NA 140 06/03/2018 1404   K 4.1 06/03/2018 1404   CL 102 06/03/2018 1404   CO2 24 06/03/2018 1404   GLUCOSE 94 06/03/2018 1404   GLUCOSE 129 (H) 04/22/2018 1713   BUN 12 06/03/2018 1404   CREATININE 0.90 06/03/2018 1404   CREATININE 0.84 02/21/2014 1022   CALCIUM 8.8 06/03/2018 1404   GFRNONAA 71 06/03/2018 1404   GFRAA 82 06/03/2018 1404   Lab Results  Component Value Date   HGBA1C 6.1 (H) 06/03/2018   HGBA1C 6.1 03/10/2018   HGBA1C 5.9 08/07/2017   HGBA1C 5.8 11/28/2016   HGBA1C 5.7 05/07/2016   Lab Results  Component Value Date   INSULIN 6.9 06/03/2018   CBC    Component Value Date/Time   WBC 3.2 (L) 06/03/2018 1404   WBC 3.6 (L) 04/22/2018 1631   RBC 4.48 06/03/2018 1404   RBC 4.46 04/22/2018 1631   HGB 12.9 06/03/2018 1404   HCT 37.9 06/03/2018 1404   PLT 228 04/22/2018 1631   MCV 85 06/03/2018 1404   MCH 28.8 06/03/2018 1404   MCH 27.8 04/22/2018 1631   MCHC 34.0 06/03/2018 1404   MCHC 30.7 04/22/2018 1631   RDW 13.5 06/03/2018 1404   LYMPHSABS 1.4 06/03/2018 1404   MONOABS 0.4 08/07/2017 0711   EOSABS 0.1 06/03/2018 1404   BASOSABS 0.0 06/03/2018 1404   Iron/TIBC/Ferritin/ %Sat No results found for: IRON, TIBC, FERRITIN, IRONPCTSAT Lipid Panel     Component Value Date/Time   CHOL 183 06/03/2018 1404   TRIG 49 06/03/2018 1404   HDL 68 06/03/2018 1404   CHOLHDL 3 03/10/2018 0814   VLDL  10.4 03/10/2018 0814   LDLCALC 105 (H) 06/03/2018 1404   LDLDIRECT 129.1 10/17/2009 0825   Hepatic Function Panel     Component Value Date/Time   PROT 7.0 06/03/2018 1404   ALBUMIN 4.3 06/03/2018 1404   AST 25 06/03/2018 1404   ALT 24 06/03/2018 1404   ALKPHOS 100 06/03/2018 1404   BILITOT 0.5 06/03/2018 1404   BILIDIR 0.0 05/02/2014 1503      Component Value Date/Time   TSH 1.850 06/03/2018 1404   TSH 2.89 08/07/2017 0711   TSH 3.181 02/21/2014 1038      I, Trixie Dredge, am acting as Location manager for Ilene Qua, MD  I have reviewed the above documentation for accuracy and completeness, and I agree with the above. - Ilene Qua, MD

## 2018-10-01 ENCOUNTER — Encounter: Payer: Self-pay | Admitting: Family Medicine

## 2018-10-01 ENCOUNTER — Ambulatory Visit (INDEPENDENT_AMBULATORY_CARE_PROVIDER_SITE_OTHER): Payer: 59 | Admitting: Family Medicine

## 2018-10-01 ENCOUNTER — Other Ambulatory Visit: Payer: Self-pay

## 2018-10-01 DIAGNOSIS — M546 Pain in thoracic spine: Secondary | ICD-10-CM | POA: Diagnosis not present

## 2018-10-01 DIAGNOSIS — I10 Essential (primary) hypertension: Secondary | ICD-10-CM | POA: Diagnosis not present

## 2018-10-01 MED ORDER — METHOCARBAMOL 500 MG PO TABS: 500.0000 mg | ORAL_TABLET | Freq: Four times a day (QID) | ORAL | 0 refills | Status: DC | PRN

## 2018-10-01 MED ORDER — CARVEDILOL 12.5 MG PO TABS: 12.5000 mg | ORAL_TABLET | Freq: Two times a day (BID) | ORAL | 1 refills | Status: DC

## 2018-10-01 MED ORDER — AMLODIPINE BESYLATE 2.5 MG PO TABS: 2.5000 mg | ORAL_TABLET | Freq: Every day | ORAL | 1 refills | Status: DC

## 2018-10-01 NOTE — Progress Notes (Signed)
Virtual Visit via Video Note  I connected with Tammy [name] on 10/01/18 at  3:30 PM EDT by a video enabled telemedicine application and verified that I am speaking with the correct person using two identifiers.  Location: Patient: home  Provider: office    I discussed the limitations of evaluation and management by telemedicine and the availability of in person appointments. The patient expressed understanding and agreed to proceed.  History of Present Illness: Pt is home c/o R mid back pain that she describes as an ache/ spasm.  No known injury No chest pain , no sob Pt radiates to front under r breast    Observations/Objective: .no vitals obtained Pt is in NAd  Normal rr    Assessment and Plan: 1. Acute right-sided thoracic back pain Muscle relaxers Tylenol Heat  Consider chiropractor  - methocarbamol (ROBAXIN) 500 MG tablet; Take 1 tablet (500 mg total) by mouth every 6 (six) hours as needed for muscle spasms.  Dispense: 45 tablet; Refill: 0  2. Essential hypertension Well controlled, no changes to meds. Encouraged heart healthy diet such as the DASH diet and exercise as tolerated.   - amLODipine (NORVASC) 2.5 MG tablet; Take 1 tablet (2.5 mg total) by mouth daily.  Dispense: 90 tablet; Refill: 1 - carvedilol (COREG) 12.5 MG tablet; Take 1 tablet (12.5 mg total) by mouth 2 (two) times daily with a meal.  Dispense: 180 tablet; Refill: 1   Follow Up Instructions:    I discussed the assessment and treatment plan with the patient. The patient was provided an opportunity to ask questions and all were answered. The patient agreed with the plan and demonstrated an understanding of the instructions.   The patient was advised to call back or seek an in-person evaluation if the symptoms worsen or if the condition fails to improve as anticipated.  I provided 15 minutes of non-face-to-face time during this encounter.   [name], DO

## 2018-10-04 ENCOUNTER — Telehealth: Payer: Self-pay | Admitting: Family Medicine

## 2018-10-04 NOTE — Telephone Encounter (Signed)
LVM for pt to call and schedule 6 month fu appt.

## 2018-10-07 ENCOUNTER — Encounter (INDEPENDENT_AMBULATORY_CARE_PROVIDER_SITE_OTHER): Payer: Self-pay | Admitting: Family Medicine

## 2018-10-07 ENCOUNTER — Ambulatory Visit (INDEPENDENT_AMBULATORY_CARE_PROVIDER_SITE_OTHER): Payer: 59 | Admitting: Family Medicine

## 2018-10-07 ENCOUNTER — Other Ambulatory Visit: Payer: Self-pay

## 2018-10-07 ENCOUNTER — Telehealth: Payer: Self-pay | Admitting: Family Medicine

## 2018-10-07 DIAGNOSIS — Z6841 Body Mass Index (BMI) 40.0 and over, adult: Secondary | ICD-10-CM

## 2018-10-07 DIAGNOSIS — E559 Vitamin D deficiency, unspecified: Secondary | ICD-10-CM | POA: Diagnosis not present

## 2018-10-07 DIAGNOSIS — R7303 Prediabetes: Secondary | ICD-10-CM

## 2018-10-07 NOTE — Telephone Encounter (Signed)
She had a series of three - we could do a fourth one.

## 2018-10-07 NOTE — Telephone Encounter (Signed)
Will look into gel injections for the right knee.

## 2018-10-07 NOTE — Telephone Encounter (Signed)
Patient called requesting to get the gel injections in her right knee

## 2018-10-08 ENCOUNTER — Ambulatory Visit (INDEPENDENT_AMBULATORY_CARE_PROVIDER_SITE_OTHER): Payer: 59 | Admitting: Family Medicine

## 2018-10-08 ENCOUNTER — Encounter: Payer: Self-pay | Admitting: Family Medicine

## 2018-10-08 ENCOUNTER — Other Ambulatory Visit: Payer: Self-pay

## 2018-10-08 VITALS — BP 130/71 | HR 51 | Temp 98.6°F | Resp 18 | Ht 65.0 in | Wt 335.4 lb

## 2018-10-08 DIAGNOSIS — E119 Type 2 diabetes mellitus without complications: Secondary | ICD-10-CM | POA: Diagnosis not present

## 2018-10-08 DIAGNOSIS — Z6841 Body Mass Index (BMI) 40.0 and over, adult: Secondary | ICD-10-CM

## 2018-10-08 DIAGNOSIS — R109 Unspecified abdominal pain: Secondary | ICD-10-CM

## 2018-10-08 DIAGNOSIS — R1011 Right upper quadrant pain: Secondary | ICD-10-CM | POA: Diagnosis not present

## 2018-10-08 DIAGNOSIS — I10 Essential (primary) hypertension: Secondary | ICD-10-CM | POA: Diagnosis not present

## 2018-10-08 DIAGNOSIS — E43 Unspecified severe protein-calorie malnutrition: Secondary | ICD-10-CM

## 2018-10-08 DIAGNOSIS — E785 Hyperlipidemia, unspecified: Secondary | ICD-10-CM

## 2018-10-08 DIAGNOSIS — E1151 Type 2 diabetes mellitus with diabetic peripheral angiopathy without gangrene: Secondary | ICD-10-CM

## 2018-10-08 DIAGNOSIS — R609 Edema, unspecified: Secondary | ICD-10-CM

## 2018-10-08 LAB — POC URINALSYSI DIPSTICK (AUTOMATED)
Blood, UA: NEGATIVE
Glucose, UA: NEGATIVE
Ketones, UA: NEGATIVE
Leukocytes, UA: NEGATIVE
Nitrite, UA: NEGATIVE
Protein, UA: POSITIVE — AB
Spec Grav, UA: 1.02 (ref 1.010–1.025)
Urobilinogen, UA: 1 E.U./dL
pH, UA: 6 (ref 5.0–8.0)

## 2018-10-08 MED ORDER — AMLODIPINE BESYLATE 5 MG PO TABS: 5.0000 mg | ORAL_TABLET | Freq: Every day | ORAL | 3 refills | Status: DC

## 2018-10-08 NOTE — Progress Notes (Signed)
Patient ID: Tammy [name], female    [name]: 10-07-1960  Age: 58 y.o. MRN: [id number]    Subjective:  Subjective  HPI Tammy [name] presents for R sided abd pain and r flank pain.  No fevers, no nvd No urinary symptoms Food makes the RUQ pain worse  See last ov Pt also needs f/u bp , dm and chol She will start back with healthy weight and wellness in next few weeks.    Review of Systems  Constitutional: Negative for appetite change, diaphoresis, fatigue and unexpected weight change.  Eyes: Negative for pain, redness and visual disturbance.  Respiratory: Negative for cough, chest tightness, shortness of breath and wheezing.   Cardiovascular: Negative for chest pain, palpitations and leg swelling.  Gastrointestinal: Positive for abdominal pain. Negative for blood in stool, constipation, diarrhea, nausea, rectal pain and vomiting.  Endocrine: Negative for cold intolerance, heat intolerance, polydipsia, polyphagia and polyuria.  Genitourinary: Positive for flank pain. Negative for difficulty urinating, dysuria, frequency, hematuria, pelvic pain, vaginal bleeding, vaginal discharge and vaginal pain.  Musculoskeletal: Positive for back pain.  Neurological: Negative for dizziness, light-headedness, numbness and headaches.    History Past Medical History:  Diagnosis Date  . Acute on chronic appendicitis s/p lap appy 06/10/2012 06/10/2012   surgery done 06-10-12  . Arthritis    ankle  . Back pain   . Chest pain   . Complication of anesthesia   . Constipation   . Diabetes mellitus    TYPE 2- no meds in several months-was taken off meds -monitoring blood sugar.  Marland Kitchen GERD (gastroesophageal reflux disease)   . History of colon polyps   . Hyperlipidemia   . Hypertension   . Joint pain   . Lactose intolerance   . Morbid obesity - BMI > 50 12/13/2012  . Pneumonia    none recent  . PONV (postoperative nausea and vomiting)   . Poor venous access    "usually difficult stick"  . Prediabetes    . Sinusitis   . SOB (shortness of breath)   . Swelling   . Vitamin D deficiency     She has a past surgical history that includes Abdominal hysterectomy; Knee surgery (Right); Upper gastrointestinal endoscopy (04/14/2006); Colonoscopy (04/11/11); laparoscopic appendectomy (06/10/2012); Appendectomy; Gastric Roux-En-Y (N/A, 02/27/2015); and Colonoscopy with propofol (N/A, 05/11/2017).   Her family history includes Breast cancer in her paternal aunt and paternal aunt; Cancer (age of onset: 50) in her mother; Diabetes in her father and paternal grandmother; Heart disease in her father; Heart failure in her father; Hyperlipidemia in her brother and mother; Hypertension in her mother.She reports that she has never smoked. She has never used smokeless tobacco. She reports that she does not drink alcohol or use drugs.  Current Outpatient Medications on File Prior to Visit  Medication Sig Dispense Refill  . carvedilol (COREG) 12.5 MG tablet Take 1 tablet (12.5 mg total) by mouth 2 (two) times daily with a meal. 180 tablet 1  . fluticasone (FLONASE) 50 MCG/ACT nasal spray Place 2 sprays into both nostrils daily. 16 g 6  . furosemide (LASIX) 40 MG tablet TAKE 1 TABLET BY MOUTH  DAILY 90 tablet 3  . glucose blood test strip Check blood sugar twice daily 100 each 11  . loratadine (CLARITIN) 10 MG tablet Take 1 tablet (10 mg total) by mouth daily. 90 tablet 3  . methocarbamol (ROBAXIN) 500 MG tablet Take 1 tablet (500 mg total) by mouth every 6 (six) hours as needed for  muscle spasms. 45 tablet 0  . Multiple Vitamins-Minerals (MULTIVITAMIN ADULT PO) Take 1 tablet daily by mouth. flintstone vitamins    . NONFORMULARY OR COMPOUNDED ITEM Thigh high compression socks  20-30 mmhg    Re- low ext edema 1 each 1  . pantoprazole (PROTONIX) 40 MG tablet Take 2 tablets (80 mg total) by mouth daily. 180 tablet 3  . Vitamin D, Ergocalciferol, (DRISDOL) 1.25 MG (50000 UT) CAPS capsule Take 1 capsule (50,000 Units total) by  mouth every 7 (seven) days. 4 capsule 0   No current facility-administered medications on file prior to visit.      Objective:  Objective  Physical Exam Vitals signs and nursing note reviewed.  Constitutional:      Appearance: She is well-developed.  HENT:     Head: Normocephalic and atraumatic.  Eyes:     Conjunctiva/sclera: Conjunctivae normal.  Neck:     Musculoskeletal: Normal range of motion and neck supple.     Thyroid: No thyromegaly.     Vascular: No carotid bruit or JVD.  Cardiovascular:     Rate and Rhythm: Normal rate and regular rhythm.     Heart sounds: Normal heart sounds. No murmur.  Pulmonary:     Effort: Pulmonary effort is normal. No respiratory distress.     Breath sounds: Normal breath sounds. No wheezing or rales.  Chest:     Chest wall: No tenderness.  Abdominal:     Tenderness: There is abdominal tenderness in the right upper quadrant. There is no right CVA tenderness, left CVA tenderness, guarding or rebound.     Hernia: No hernia is present.  Musculoskeletal:     Right ankle: She exhibits swelling.     Left ankle: She exhibits swelling.     Right foot: Swelling present.     Left foot: Swelling present.  Neurological:     Mental Status: She is alert and oriented to person, place, and time.    BP 130/71 (BP Location: Right Arm, Patient Position: Sitting, Cuff Size: Large)   Pulse (!) 51   Temp 98.6 F (37 C) (Oral)   Resp 18   Ht 5' 5"  (1.651 m)   Wt (!) 335 lb 6.4 oz (152.1 kg)   SpO2 100%   BMI 55.81 kg/m  Wt Readings from Last 3 Encounters:  10/08/18 (!) 335 lb 6.4 oz (152.1 kg)  07/06/18 (!) 330 lb (149.7 kg)  06/25/18 (!) 326 lb (147.9 kg)     Lab Results  Component Value Date   WBC 2.7 (L) 10/08/2018   HGB 12.6 10/08/2018   HCT 39.1 10/08/2018   PLT 254 10/08/2018   GLUCOSE 90 10/08/2018   CHOL 183 06/03/2018   TRIG 49 06/03/2018   HDL 68 06/03/2018   LDLDIRECT 129.1 10/17/2009   LDLCALC 105 (H) 06/03/2018   ALT 15  10/08/2018   AST 15 10/08/2018   NA 140 10/08/2018   K 3.8 10/08/2018   CL 105 10/08/2018   CREATININE 0.95 10/08/2018   BUN 14 10/08/2018   CO2 25 10/08/2018   TSH 1.850 06/03/2018   INR 1.1 (H) 10/16/2014   HGBA1C 5.9 (H) 10/08/2018   MICROALBUR 0.8 08/07/2017    Mm Digital Screening Bilateral  Result Date: 05/21/2018 CLINICAL DATA:  Screening. EXAM: DIGITAL SCREENING BILATERAL MAMMOGRAM WITH CAD COMPARISON:  Previous exam(s). ACR Breast Density Category b: There are scattered areas of fibroglandular density. FINDINGS: There are no findings suspicious for malignancy. Images were processed with CAD. IMPRESSION: No  mammographic evidence of malignancy. A result letter of this screening mammogram will be mailed directly to the patient. RECOMMENDATION: Screening mammogram in one year. (Code:SM-B-01Y) BI-RADS CATEGORY  1: Negative. Electronically Signed   By: Nolon Nations M.D.   On: 05/21/2018 08:30     Assessment & Plan:  Plan  I have discontinued [employer]'s amLODipine. I am also having her start on amLODipine. Additionally, I am having her maintain her glucose blood, Multiple Vitamins-Minerals (MULTIVITAMIN ADULT PO), furosemide, loratadine, NONFORMULARY OR COMPOUNDED ITEM, pantoprazole, fluticasone, Vitamin D (Ergocalciferol), methocarbamol, and carvedilol.  Meds ordered this encounter  Medications  . amLODipine (NORVASC) 5 MG tablet    Sig: Take 1 tablet (5 mg total) by mouth daily.    Dispense:  90 tablet    Refill:  3    Problem List Items Addressed This Visit      Unprioritized   Abdominal pain - Primary    Check labs  if pain worsens-- go to ER      Relevant Orders   US Abdomen Limited RUQ   CBC w/Diff (Completed)   Comp Met (CMET) (Completed)   Lipase (Completed)   Amylase (Completed)   ANKLE EDEMA, CHRONIC    Pt recently gained weight due to quarantine Elevated legs Pt will get back on her diet       Class 3 severe obesity with serious  comorbidity and body mass index (BMI) of 50.0 to 59.9 in adult [facility])    con't with healthy weight and wellness       DM (diabetes mellitus) type II, controlled, with peripheral vascular disorder ([place])    Lab Results  Component Value Date   HGBA1C 5.9 (H) 10/08/2018   Pt will go back to healthy weight and wellness        Relevant Medications   amLODipine (NORVASC) 5 MG tablet   Edema due to malnutrition [facility])   Essential hypertension    Well controlled, no changes to meds. Encouraged heart healthy diet such as the DASH diet and exercise as tolerated.       Relevant Medications   amLODipine (NORVASC) 5 MG tablet   Other Relevant Orders   CBC w/Diff (Completed)   Comp Met (CMET) (Completed)   Lipase (Completed)   Hyperlipidemia LDL goal <70    Tolerating statin, encouraged heart healthy diet, avoid trans fats, minimize simple carbs and saturated fats. Increase exercise as tolerated      Relevant Medications   amLODipine (NORVASC) 5 MG tablet    Other Visit Diagnoses    Right flank pain       Relevant Orders   POCT Urinalysis Dipstick (Automated) (Completed)   Diabetes mellitus without complication ([place])       Relevant Orders   Hemoglobin A1c (Completed)      Follow-up: No follow-ups on file.  [name], DO

## 2018-10-08 NOTE — Patient Instructions (Signed)
Abdominal Pain, Adult  Abdominal pain can be caused by many things. Often, abdominal pain is not serious and it gets better with no treatment or by being treated at home. However, sometimes abdominal pain is serious. Your health care provider will do a medical history and a physical exam to try to determine the cause of your abdominal pain.  Follow these instructions at home:   Take over-the-counter and prescription medicines only as told by your health care provider. Do not take a laxative unless told by your health care provider.   Drink enough fluid to keep your urine clear or pale yellow.   Watch your condition for any changes.   Keep all follow-up visits as told by your health care provider. This is important.  Contact a health care provider if:   Your abdominal pain changes or gets worse.   You are not hungry or you lose weight without trying.   You are constipated or have diarrhea for more than 2-3 days.   You have pain when you urinate or have a bowel movement.   Your abdominal pain wakes you up at night.   Your pain gets worse with meals, after eating, or with certain foods.   You are throwing up and cannot keep anything down.   You have a fever.  Get help right away if:   Your pain does not go away as soon as your health care provider told you to expect.   You cannot stop throwing up.   Your pain is only in areas of the abdomen, such as the right side or the left lower portion of the abdomen.   You have bloody or black stools, or stools that look like tar.   You have severe pain, cramping, or bloating in your abdomen.   You have signs of dehydration, such as:  ? Dark urine, very little urine, or no urine.  ? Cracked lips.  ? Dry mouth.  ? Sunken eyes.  ? Sleepiness.  ? Weakness.  This information is not intended to replace advice given to you by your health care provider. Make sure you discuss any questions you have with your health care provider.  Document Released: 01/29/2005 Document  Revised: 11/09/2015 Document Reviewed: 10/03/2015  Elsevier Interactive Patient Education  2019 [employer]

## 2018-10-09 LAB — CBC WITH DIFFERENTIAL/PLATELET
Absolute Monocytes: 292 cells/uL (ref 200–950)
Basophils Absolute: 11 cells/uL (ref 0–200)
Basophils Relative: 0.4 %
Eosinophils Absolute: 51 cells/uL (ref 15–500)
Eosinophils Relative: 1.9 %
HCT: 39.1 % (ref 35.0–45.0)
Hemoglobin: 12.6 g/dL (ref 11.7–15.5)
Lymphs Abs: 937 cells/uL (ref 850–3900)
MCH: 28.4 pg (ref 27.0–33.0)
MCHC: 32.2 g/dL (ref 32.0–36.0)
MCV: 88.1 fL (ref 80.0–100.0)
MPV: 10.9 fL (ref 7.5–12.5)
Monocytes Relative: 10.8 %
Neutro Abs: 1409 cells/uL — ABNORMAL LOW (ref 1500–7800)
Neutrophils Relative %: 52.2 %
Platelets: 254 10*3/uL (ref 140–400)
RBC: 4.44 10*6/uL (ref 3.80–5.10)
RDW: 13.8 % (ref 11.0–15.0)
Total Lymphocyte: 34.7 %
WBC: 2.7 10*3/uL — ABNORMAL LOW (ref 3.8–10.8)

## 2018-10-09 LAB — COMPREHENSIVE METABOLIC PANEL
AG Ratio: 1.5 (calc) (ref 1.0–2.5)
ALT: 15 U/L (ref 6–29)
AST: 15 U/L (ref 10–35)
Albumin: 4.2 g/dL (ref 3.6–5.1)
Alkaline phosphatase (APISO): 88 U/L (ref 37–153)
BUN: 14 mg/dL (ref 7–25)
CO2: 25 mmol/L (ref 20–32)
Calcium: 8.4 mg/dL — ABNORMAL LOW (ref 8.6–10.4)
Chloride: 105 mmol/L (ref 98–110)
Creat: 0.95 mg/dL (ref 0.50–1.05)
Globulin: 2.8 g/dL (calc) (ref 1.9–3.7)
Glucose, Bld: 90 mg/dL (ref 65–99)
Potassium: 3.8 mmol/L (ref 3.5–5.3)
Sodium: 140 mmol/L (ref 135–146)
Total Bilirubin: 0.5 mg/dL (ref 0.2–1.2)
Total Protein: 7 g/dL (ref 6.1–8.1)

## 2018-10-09 LAB — AMYLASE: Amylase: 35 U/L (ref 21–101)

## 2018-10-09 LAB — HEMOGLOBIN A1C
Hgb A1c MFr Bld: 5.9 % of total Hgb — ABNORMAL HIGH (ref <5.7)
Mean Plasma Glucose: 123 (calc)
eAG (mmol/L): 6.8 (calc)

## 2018-10-09 LAB — LIPASE: Lipase: 6 U/L — ABNORMAL LOW (ref 7–60)

## 2018-10-10 DIAGNOSIS — E43 Unspecified severe protein-calorie malnutrition: Secondary | ICD-10-CM | POA: Insufficient documentation

## 2018-10-10 NOTE — Assessment & Plan Note (Signed)
Well controlled, no changes to meds. Encouraged heart healthy diet such as the DASH diet and exercise as tolerated.  °

## 2018-10-10 NOTE — Assessment & Plan Note (Addendum)
Lab Results  Component Value Date   HGBA1C 5.9 (H) 10/08/2018   Pt will go back to healthy weight and wellness

## 2018-10-10 NOTE — Assessment & Plan Note (Signed)
Check labs  if pain worsens-- go to ER

## 2018-10-10 NOTE — Assessment & Plan Note (Signed)
Tolerating statin, encouraged heart healthy diet, avoid trans fats, minimize simple carbs and saturated fats. Increase exercise as tolerated 

## 2018-10-10 NOTE — Assessment & Plan Note (Signed)
con't with healthy weight and wellness

## 2018-10-10 NOTE — Assessment & Plan Note (Signed)
Pt recently gained weight due to quarantine Elevated legs Pt will get back on her diet

## 2018-10-11 NOTE — Progress Notes (Signed)
Office: [phone]  /  Fax: [phone] TeleHealth Visit:  Tammy [name] has verbally consented to this TeleHealth visit today. The patient is located at home, the provider is located at the [employer] [facility] office. The participants in this visit include the listed provider and patient. Presley was unable to use realtime audiovisual technology today and the telehealth visit was conducted via telephone.   HPI:   Chief Complaint: OBESITY Tammy [name] is here to discuss her progress with her obesity treatment plan. She is on the Category 3 plan and is following her eating plan approximately 75-80 % of the time. She states she is exercising 0 minutes 0 times per week. Pernella recently started on a muscle relaxer (Robaxin 500 mg), due to muscle spasms in her back. She voices she is able to move more easily secondary to medications. Her appetite has decreased secondary to pain. She would like to follow the Category 3 now that she is feeling better. She states her blood pressure has been 143/72 and 135/78. We were unable to weigh the patient today for this TeleHealth visit. She is unsure if she has lost or gained weight since her last visit. She has lost 4 lbs since starting treatment with [name].  Pre-Diabetes Tammy [name] has a diagnosis of pre-diabetes based on her elevated Hgb A1c and was informed this puts her at greater risk of developing diabetes. She states her BGs range between 95 and 110 on no medications. Last Hgb A1c was of 6.1 on 06/03/2018. She is taking metformin currently and denies GI side effects. She continues to work on diet and exercise to decrease risk of diabetes. She denies hypoglycemia.  Vitamin D Deficiency Tammy [name] has a diagnosis of vitamin D deficiency. She is currently taking prescription Vit D. She notes fatigue and denies nausea, vomiting or muscle weakness.  ASSESSMENT AND PLAN:  Prediabetes  Vitamin D deficiency  Class 3 severe obesity with serious comorbidity and  body mass index (BMI) of 50.0 to 59.9 in adult, unspecified obesity type [facility])  PLAN:  Pre-Diabetes Tammy [name] will continue to work on weight loss, exercise, and decreasing simple carbohydrates in her diet to help decrease the risk of diabetes. We dicussed metformin including benefits and risks. She was informed that eating too many simple carbohydrates or too many calories at one sitting increases the likelihood of GI side effects. Tammy [name] agrees to continue taking metformin, and we will repeat labs at her first in person appointment. Tammy [name] agrees to follow up with our clinic in 2 weeks as directed to monitor her progress.  Vitamin D Deficiency Tammy [name] was informed that low vitamin D levels contributes to fatigue and are associated with obesity, breast, and colon cancer. Tammy [name] agrees to continue taking prescription Vit D @50 ,000 IU every week, no refill needed. She will follow up for routine testing of vitamin D, at least 2-3 times per year. She was informed of the risk of over-replacement of vitamin D and agrees to not increase her dose unless she discusses this with [name] first. Tammy [name] agrees to follow up with our clinic in 2 weeks.  Obesity Tammy [name] is currently in the action stage of change. As such, her goal is to continue with weight loss efforts She has agreed to follow the Category 3 plan Tammy [name] has been instructed to work up to a goal of 150 minutes of combined cardio and strengthening exercise per week for weight loss and overall health benefits. We discussed the following Behavioral Modification Strategies today: increasing lean protein  intake, increasing vegetables and work on meal planning and easy cooking plans, keeping healthy foods in the home, better snacking choices, and planning for success   Tammy [name] has agreed to follow up with our clinic in 2 weeks. She was informed of the importance of frequent follow up visits to maximize her success with intensive lifestyle modifications for her  multiple health conditions.  ALLERGIES: Allergies  Allergen Reactions  . Ace Inhibitors Anaphylaxis    Bowel angioedema  . Cheese Nausea And Vomiting  . Red Dye Nausea And Vomiting  . [name] Flash [Fish Allergy] Nausea And Vomiting    MEDICATIONS: Current Outpatient Medications on File Prior to Visit  Medication Sig Dispense Refill  . carvedilol (COREG) 12.5 MG tablet Take 1 tablet (12.5 mg total) by mouth 2 (two) times daily with a meal. 180 tablet 1  . fluticasone (FLONASE) 50 MCG/ACT nasal spray Place 2 sprays into both nostrils daily. 16 g 6  . furosemide (LASIX) 40 MG tablet TAKE 1 TABLET BY MOUTH  DAILY 90 tablet 3  . glucose blood test strip Check blood sugar twice daily 100 each 11  . loratadine (CLARITIN) 10 MG tablet Take 1 tablet (10 mg total) by mouth daily. 90 tablet 3  . methocarbamol (ROBAXIN) 500 MG tablet Take 1 tablet (500 mg total) by mouth every 6 (six) hours as needed for muscle spasms. 45 tablet 0  . Multiple Vitamins-Minerals (MULTIVITAMIN ADULT PO) Take 1 tablet daily by mouth. flintstone vitamins    . NONFORMULARY OR COMPOUNDED ITEM Thigh high compression socks  20-30 mmhg    Re- low ext edema 1 each 1  . pantoprazole (PROTONIX) 40 MG tablet Take 2 tablets (80 mg total) by mouth daily. 180 tablet 3  . Vitamin D, Ergocalciferol, (DRISDOL) 1.25 MG (50000 UT) CAPS capsule Take 1 capsule (50,000 Units total) by mouth every 7 (seven) days. 4 capsule 0   No current facility-administered medications on file prior to visit.     PAST MEDICAL HISTORY: Past Medical History:  Diagnosis Date  . Acute on chronic appendicitis s/p lap appy 06/10/2012 06/10/2012   surgery done 06-10-12  . Arthritis    ankle  . Back pain   . Chest pain   . Complication of anesthesia   . Constipation   . Diabetes mellitus    TYPE 2- no meds in several months-was taken off meds -monitoring blood sugar.  Marland Kitchen GERD (gastroesophageal reflux disease)   . History of colon polyps   . Hyperlipidemia   .  Hypertension   . Joint pain   . Lactose intolerance   . Morbid obesity - BMI > 50 12/13/2012  . Pneumonia    none recent  . PONV (postoperative nausea and vomiting)   . Poor venous access    "usually difficult stick"  . Prediabetes   . Sinusitis   . SOB (shortness of breath)   . Swelling   . Vitamin D deficiency     PAST SURGICAL HISTORY: Past Surgical History:  Procedure Laterality Date  . ABDOMINAL HYSTERECTOMY    . APPENDECTOMY     2'14  . COLONOSCOPY  04/11/11   5 mm polyp removed but not recovered  . COLONOSCOPY WITH PROPOFOL N/A 05/11/2017   Procedure: COLONOSCOPY WITH PROPOFOL;  Surgeon: Gatha Mayer, MD;  Location: [facility];  Service: Endoscopy;  Laterality: N/A;  . GASTRIC ROUX-EN-Y N/A 02/27/2015   Procedure: LAPAROSCOPIC ROUX-EN-Y GASTRIC BYPASS WITH UPPER ENDOSCOPY;  Surgeon: Johnathan Hausen, MD;  Location: [facility];  Service: General;  Laterality: N/A;  . KNEE SURGERY Right     KNEE SURGERY   . LAPAROSCOPIC APPENDECTOMY  06/10/2012   Procedure: APPENDECTOMY LAPAROSCOPIC;  Surgeon: Adin Hector, MD;  Location: [facility];  Service: General;  Laterality: N/A;  . UPPER GASTROINTESTINAL ENDOSCOPY  04/14/2006   Normal    SOCIAL HISTORY: Social History   Tobacco Use  . Smoking status: Never Smoker  . Smokeless tobacco: Never Used  Substance Use Topics  . Alcohol use: No    Alcohol/week: 0.0 standard drinks  . Drug use: No    FAMILY HISTORY: Family History  Problem Relation Age of Onset  . Hyperlipidemia Mother   . Hypertension Mother   . Cancer Mother 73       hodgkins lymphoma  . Diabetes Father   . Heart failure Father   . Heart disease Father   . Hyperlipidemia Brother   . Diabetes Paternal Grandmother   . Breast cancer Paternal Aunt        unsure of age  . Breast cancer Paternal Aunt   . Colon cancer Neg Hx   . Heart attack Neg Hx   . Sudden death Neg Hx   . Colon polyps Neg Hx   . Esophageal cancer Neg Hx   . Stomach cancer Neg Hx   .  Rectal cancer Neg Hx     ROS: Review of Systems  Constitutional: Positive for malaise/fatigue. Negative for weight loss.  Gastrointestinal: Negative for nausea and vomiting.  Musculoskeletal:       Negative muscle weakness  Endo/Heme/Allergies:       Negative hypoglycemia    PHYSICAL EXAM: Pt in no acute distress  RECENT LABS AND TESTS: BMET    Component Value Date/Time   NA 140 10/08/2018 1431   NA 140 06/03/2018 1404   K 3.8 10/08/2018 1431   CL 105 10/08/2018 1431   CO2 25 10/08/2018 1431   GLUCOSE 90 10/08/2018 1431   BUN 14 10/08/2018 1431   BUN 12 06/03/2018 1404   CREATININE 0.95 10/08/2018 1431   CALCIUM 8.4 (L) 10/08/2018 1431   GFRNONAA 71 06/03/2018 1404   GFRAA 82 06/03/2018 1404   Lab Results  Component Value Date   HGBA1C 5.9 (H) 10/08/2018   HGBA1C 6.1 (H) 06/03/2018   HGBA1C 6.1 03/10/2018   HGBA1C 5.9 08/07/2017   HGBA1C 5.8 11/28/2016   Lab Results  Component Value Date   INSULIN 6.9 06/03/2018   CBC    Component Value Date/Time   WBC 2.7 (L) 10/08/2018 1431   RBC 4.44 10/08/2018 1431   HGB 12.6 10/08/2018 1431   HGB 12.9 06/03/2018 1404   HCT 39.1 10/08/2018 1431   HCT 37.9 06/03/2018 1404   PLT 254 10/08/2018 1431   MCV 88.1 10/08/2018 1431   MCV 85 06/03/2018 1404   MCH 28.4 10/08/2018 1431   MCHC 32.2 10/08/2018 1431   RDW 13.8 10/08/2018 1431   RDW 13.5 06/03/2018 1404   LYMPHSABS 937 10/08/2018 1431   LYMPHSABS 1.4 06/03/2018 1404   MONOABS 0.4 08/07/2017 0711   EOSABS 51 10/08/2018 1431   EOSABS 0.1 06/03/2018 1404   BASOSABS 11 10/08/2018 1431   BASOSABS 0.0 06/03/2018 1404   Iron/TIBC/Ferritin/ %Sat No results found for: IRON, TIBC, FERRITIN, IRONPCTSAT Lipid Panel     Component Value Date/Time   CHOL 183 06/03/2018 1404   TRIG 49 06/03/2018 1404   HDL 68 06/03/2018 1404   CHOLHDL 3 03/10/2018 0814   VLDL  10.4 03/10/2018 0814   LDLCALC 105 (H) 06/03/2018 1404   LDLDIRECT 129.1 10/17/2009 0825   Hepatic  Function Panel     Component Value Date/Time   PROT 7.0 10/08/2018 1431   PROT 7.0 06/03/2018 1404   ALBUMIN 4.3 06/03/2018 1404   AST 15 10/08/2018 1431   ALT 15 10/08/2018 1431   ALKPHOS 100 06/03/2018 1404   BILITOT 0.5 10/08/2018 1431   BILITOT 0.5 06/03/2018 1404   BILIDIR 0.0 05/02/2014 1503      Component Value Date/Time   TSH 1.850 06/03/2018 1404   TSH 2.89 08/07/2017 0711   TSH 3.181 02/21/2014 1038      I, Trixie Dredge, am acting as Location manager for Ilene Qua, MD  I have reviewed the above documentation for accuracy and completeness, and I agree with the above. - Ilene Qua, MD

## 2018-10-12 ENCOUNTER — Ambulatory Visit (HOSPITAL_BASED_OUTPATIENT_CLINIC_OR_DEPARTMENT_OTHER)
Admission: RE | Admit: 2018-10-12 | Discharge: 2018-10-12 | Disposition: A | Discharge status: Home / Self Care | Payer: 59 | Source: Ambulatory Visit | Attending: Family Medicine | Admitting: Family Medicine

## 2018-10-12 ENCOUNTER — Other Ambulatory Visit: Payer: Self-pay

## 2018-10-12 DIAGNOSIS — R1011 Right upper quadrant pain: Secondary | ICD-10-CM | POA: Diagnosis not present

## 2018-10-13 ENCOUNTER — Encounter: Payer: Self-pay | Admitting: Family Medicine

## 2018-10-15 ENCOUNTER — Encounter: Payer: Self-pay | Admitting: Family Medicine

## 2018-10-18 ENCOUNTER — Telehealth: Payer: Self-pay

## 2018-10-18 ENCOUNTER — Other Ambulatory Visit: Payer: Self-pay | Admitting: Family Medicine

## 2018-10-18 DIAGNOSIS — R1013 Epigastric pain: Secondary | ICD-10-CM

## 2018-10-18 NOTE — Telephone Encounter (Signed)
Pt notified via Mychart

## 2018-10-18 NOTE — Telephone Encounter (Signed)
We will refer her to a gi doctor

## 2018-10-18 NOTE — Telephone Encounter (Signed)
Per patient from [employer]. Please advise:  I'm still having slight pain not nowhere near as bad, but I notice that certain foods sit on my stomach for a long time and it doesn't move.I get full really quick.

## 2018-10-20 NOTE — Progress Notes (Signed)
Pt viewed in [place]

## 2018-10-21 ENCOUNTER — Other Ambulatory Visit: Payer: Self-pay

## 2018-10-21 ENCOUNTER — Ambulatory Visit (INDEPENDENT_AMBULATORY_CARE_PROVIDER_SITE_OTHER): Payer: 59 | Admitting: Family Medicine

## 2018-10-21 ENCOUNTER — Encounter (INDEPENDENT_AMBULATORY_CARE_PROVIDER_SITE_OTHER): Payer: Self-pay | Admitting: Family Medicine

## 2018-10-21 DIAGNOSIS — R7303 Prediabetes: Secondary | ICD-10-CM

## 2018-10-21 DIAGNOSIS — E66813 Obesity, class 3: Secondary | ICD-10-CM

## 2018-10-21 DIAGNOSIS — I10 Essential (primary) hypertension: Secondary | ICD-10-CM

## 2018-10-21 DIAGNOSIS — Z6841 Body Mass Index (BMI) 40.0 and over, adult: Secondary | ICD-10-CM | POA: Diagnosis not present

## 2018-10-25 NOTE — Progress Notes (Signed)
Office: [phone]  /  Fax: [phone] TeleHealth Visit:  Tammy [name] has verbally consented to this TeleHealth visit today. The patient is located at home, the provider is located at the [employer] [facility] office. The participants in this visit include the listed provider and patient. The visit was conducted today via face time.  HPI:   Chief Complaint: OBESITY Tammy [name] is here to discuss her progress with her obesity treatment plan. She is on the Category 3 plan and is following her eating plan approximately 50 % of the time. She states she is exercising 0 minutes 0 times per week. Tammy [name]'s weight is of 332 lbs as of today. She had significant abdominal pain between her last appointment and a week and a half ago. She ultimately went to go see Dr. Etter Sjogren for further evaluation. She has been working on getting water in.  We were unable to weigh the patient today for this TeleHealth visit. She feels as if she has gained weight since her last visit. She has lost 4 lbs since starting treatment with [name].  Hypertension Tammy [name] is a 58 y.o. female with hypertension. Tammy [name]'s blood pressure is of 130/80 at home. She denies chest pain, chest pressure, or headaches. She is working on weight loss to help control her blood pressure with the goal of decreasing her risk of heart attack and stroke.  Pre-Diabetes Tammy [name] has a diagnosis of pre-diabetes based on her elevated Hgb A1c, recently found to be 5.9 (down from 6.1). She was informed this puts her at greater risk of developing diabetes. She is not taking metformin currently and continues to work on diet and exercise to decrease risk of diabetes. She denies nausea or hypoglycemia.  ASSESSMENT AND PLAN:  Essential hypertension  Prediabetes  Class 3 severe obesity with serious comorbidity and body mass index (BMI) of 50.0 to 59.9 in adult, unspecified obesity type ([place])  PLAN:  Hypertension We discussed sodium restriction,  working on healthy weight loss, and a regular exercise program as the means to achieve improved blood pressure control. [name] agreed with this plan and agreed to follow up as directed. We will continue to monitor her blood pressure as well as her progress with the above lifestyle modifications. [name] agrees to continue her current blood pressure medications and will watch for signs of hypotension as she continues her lifestyle modifications. [name] agrees to follow up with our clinic in 2 weeks.  Pre-Diabetes Tammy [name] will continue to work on weight loss, exercise, and decreasing simple carbohydrates in her diet to help decrease the risk of diabetes. We dicussed metformin including benefits and risks. She was informed that eating too many simple carbohydrates or too many calories at one sitting increases the likelihood of GI side effects. Tammy [name] declined metformin for now and a prescription was not written today. We will repeat labs in 3 months. Latise agrees to follow up with our clinic in 2 weeks as directed to monitor her progress.  Obesity Tammy [name] is currently in the action stage of change. As such, her goal is to continue with weight loss efforts She has agreed to follow the Pescatarian eating plan + 300 calories Tammy [name] has been instructed to work up to a goal of 150 minutes of combined cardio and strengthening exercise per week for weight loss and overall health benefits. We discussed the following Behavioral Modification Strategies today: increasing lean protein intake, no skipping meals, work on meal planning and easy cooking plans, and planning for success  Tammy [name] has agreed to follow up with our clinic in 2 weeks. She was informed of the importance of frequent follow up visits to maximize her success with intensive lifestyle modifications for her multiple health conditions.  ALLERGIES: Allergies  Allergen Reactions  . Ace Inhibitors Anaphylaxis    Bowel angioedema  . Cheese Nausea And  Vomiting  . Red Dye Nausea And Vomiting  . Tammy [name] [Fish Allergy] Nausea And Vomiting    MEDICATIONS: Current Outpatient Medications on File Prior to Visit  Medication Sig Dispense Refill  . amLODipine (NORVASC) 5 MG tablet Take 1 tablet (5 mg total) by mouth daily. 90 tablet 3  . carvedilol (COREG) 12.5 MG tablet Take 1 tablet (12.5 mg total) by mouth 2 (two) times daily with a meal. 180 tablet 1  . fluticasone (FLONASE) 50 MCG/ACT nasal spray Place 2 sprays into both nostrils daily. 16 g 6  . furosemide (LASIX) 40 MG tablet TAKE 1 TABLET BY MOUTH  DAILY 90 tablet 3  . glucose blood test strip Check blood sugar twice daily 100 each 11  . loratadine (CLARITIN) 10 MG tablet Take 1 tablet (10 mg total) by mouth daily. 90 tablet 3  . methocarbamol (ROBAXIN) 500 MG tablet Take 1 tablet (500 mg total) by mouth every 6 (six) hours as needed for muscle spasms. 45 tablet 0  . Multiple Vitamins-Minerals (MULTIVITAMIN ADULT PO) Take 1 tablet daily by mouth. flintstone vitamins    . NONFORMULARY OR COMPOUNDED ITEM Thigh high compression socks  20-30 mmhg    Re- low ext edema 1 each 1  . pantoprazole (PROTONIX) 40 MG tablet Take 2 tablets (80 mg total) by mouth daily. 180 tablet 3  . Vitamin D, Ergocalciferol, (DRISDOL) 1.25 MG (50000 UT) CAPS capsule Take 1 capsule (50,000 Units total) by mouth every 7 (seven) days. 4 capsule 0   No current facility-administered medications on file prior to visit.     PAST MEDICAL HISTORY: Past Medical History:  Diagnosis Date  . Acute on chronic appendicitis s/p lap appy 06/10/2012 06/10/2012   surgery done 06-10-12  . Arthritis    ankle  . Back pain   . Chest pain   . Complication of anesthesia   . Constipation   . Diabetes mellitus    TYPE 2- no meds in several months-was taken off meds -monitoring blood sugar.  Marland Kitchen GERD (gastroesophageal reflux disease)   . History of colon polyps   . Hyperlipidemia   . Hypertension   . Joint pain   . Lactose intolerance   .  Morbid obesity - BMI > 50 12/13/2012  . Pneumonia    none recent  . PONV (postoperative nausea and vomiting)   . Poor venous access    "usually difficult stick"  . Prediabetes   . Sinusitis   . SOB (shortness of breath)   . Swelling   . Vitamin D deficiency     PAST SURGICAL HISTORY: Past Surgical History:  Procedure Laterality Date  . ABDOMINAL HYSTERECTOMY    . APPENDECTOMY     2'14  . COLONOSCOPY  04/11/11   5 mm polyp removed but not recovered  . COLONOSCOPY WITH PROPOFOL N/A 05/11/2017   Procedure: COLONOSCOPY WITH PROPOFOL;  Surgeon: Gatha Mayer, MD;  [facility];  Service: Endoscopy;  Laterality: N/A;  . GASTRIC ROUX-EN-Y N/A 02/27/2015   Procedure: LAPAROSCOPIC ROUX-EN-Y GASTRIC BYPASS WITH UPPER ENDOSCOPY;  Surgeon: Johnathan Hausen, MD;  Location: [facility];  Service: General;  Laterality: N/A;  .  KNEE SURGERY Right     KNEE SURGERY   . LAPAROSCOPIC APPENDECTOMY  06/10/2012   Procedure: APPENDECTOMY LAPAROSCOPIC;  Surgeon: Adin Hector, MD;  Location: [facility];  Service: General;  Laterality: N/A;  . UPPER GASTROINTESTINAL ENDOSCOPY  04/14/2006   Normal    SOCIAL HISTORY: Social History   Tobacco Use  . Smoking status: Never Smoker  . Smokeless tobacco: Never Used  Substance Use Topics  . Alcohol use: No    Alcohol/week: 0.0 standard drinks  . Drug use: No    FAMILY HISTORY: Family History  Problem Relation Age of Onset  . Hyperlipidemia Mother   . Hypertension Mother   . Cancer Mother 47       hodgkins lymphoma  . Diabetes Father   . Heart failure Father   . Heart disease Father   . Hyperlipidemia Brother   . Diabetes Paternal Grandmother   . Breast cancer Paternal Aunt        unsure of age  . Breast cancer Paternal Aunt   . Colon cancer Neg Hx   . Heart attack Neg Hx   . Sudden death Neg Hx   . Colon polyps Neg Hx   . Esophageal cancer Neg Hx   . Stomach cancer Neg Hx   . Rectal cancer Neg Hx     ROS: Review of Systems   Constitutional: Negative for weight loss.  Cardiovascular: Negative for chest pain.       Negative chest pressure  Gastrointestinal: Positive for abdominal pain. Negative for nausea.  Neurological: Negative for headaches.  Endo/Heme/Allergies:       Negative hypoglycemia    PHYSICAL EXAM: Pt in no acute distress  RECENT LABS AND TESTS: BMET    Component Value Date/Time   NA 140 10/08/2018 1431   NA 140 06/03/2018 1404   K 3.8 10/08/2018 1431   CL 105 10/08/2018 1431   CO2 25 10/08/2018 1431   GLUCOSE 90 10/08/2018 1431   BUN 14 10/08/2018 1431   BUN 12 06/03/2018 1404   CREATININE 0.95 10/08/2018 1431   CALCIUM 8.4 (L) 10/08/2018 1431   GFRNONAA 71 06/03/2018 1404   GFRAA 82 06/03/2018 1404   Lab Results  Component Value Date   HGBA1C 5.9 (H) 10/08/2018   HGBA1C 6.1 (H) 06/03/2018   HGBA1C 6.1 03/10/2018   HGBA1C 5.9 08/07/2017   HGBA1C 5.8 11/28/2016   Lab Results  Component Value Date   INSULIN 6.9 06/03/2018   CBC    Component Value Date/Time   WBC 2.7 (L) 10/08/2018 1431   RBC 4.44 10/08/2018 1431   HGB 12.6 10/08/2018 1431   HGB 12.9 06/03/2018 1404   HCT 39.1 10/08/2018 1431   HCT 37.9 06/03/2018 1404   PLT 254 10/08/2018 1431   MCV 88.1 10/08/2018 1431   MCV 85 06/03/2018 1404   MCH 28.4 10/08/2018 1431   MCHC 32.2 10/08/2018 1431   RDW 13.8 10/08/2018 1431   RDW 13.5 06/03/2018 1404   LYMPHSABS 937 10/08/2018 1431   LYMPHSABS 1.4 06/03/2018 1404   MONOABS 0.4 08/07/2017 0711   EOSABS 51 10/08/2018 1431   EOSABS 0.1 06/03/2018 1404   BASOSABS 11 10/08/2018 1431   BASOSABS 0.0 06/03/2018 1404   Iron/TIBC/Ferritin/ %Sat No results found for: IRON, TIBC, FERRITIN, IRONPCTSAT Lipid Panel     Component Value Date/Time   CHOL 183 06/03/2018 1404   TRIG 49 06/03/2018 1404   HDL 68 06/03/2018 1404   CHOLHDL 3 03/10/2018 0814  VLDL 10.4 03/10/2018 0814   LDLCALC 105 (H) 06/03/2018 1404   LDLDIRECT 129.1 10/17/2009 0825   Hepatic Function  Panel     Component Value Date/Time   PROT 7.0 10/08/2018 1431   PROT 7.0 06/03/2018 1404   ALBUMIN 4.3 06/03/2018 1404   AST 15 10/08/2018 1431   ALT 15 10/08/2018 1431   ALKPHOS 100 06/03/2018 1404   BILITOT 0.5 10/08/2018 1431   BILITOT 0.5 06/03/2018 1404   BILIDIR 0.0 05/02/2014 1503      Component Value Date/Time   TSH 1.850 06/03/2018 1404   TSH 2.89 08/07/2017 0711   TSH 3.181 02/21/2014 1038      I, Trixie Dredge, am acting as Location manager for Ilene Qua, MD I have reviewed the above documentation for accuracy and completeness, and I agree with the above. - Ilene Qua, MD

## 2018-11-03 ENCOUNTER — Ambulatory Visit: Payer: 59 | Admitting: Family Medicine

## 2018-11-08 ENCOUNTER — Encounter: Payer: Self-pay | Admitting: Family Medicine

## 2018-11-08 ENCOUNTER — Other Ambulatory Visit: Payer: Self-pay

## 2018-11-08 ENCOUNTER — Ambulatory Visit (INDEPENDENT_AMBULATORY_CARE_PROVIDER_SITE_OTHER): Payer: 59 | Admitting: Family Medicine

## 2018-11-08 DIAGNOSIS — M1711 Unilateral primary osteoarthritis, right knee: Secondary | ICD-10-CM | POA: Diagnosis not present

## 2018-11-09 ENCOUNTER — Encounter: Payer: Self-pay | Admitting: Family Medicine

## 2018-11-09 ENCOUNTER — Ambulatory Visit (INDEPENDENT_AMBULATORY_CARE_PROVIDER_SITE_OTHER): Payer: 59 | Admitting: Family Medicine

## 2018-11-09 NOTE — Progress Notes (Signed)
PCP: [name], DO  Subjective:   HPI: Patient is a 58 y.o. female here for right knee arthritis.  Patient here to start gelsyn series for right knee - did well with series for left knee earlier this year. No skin changes, other new complaints.  Past Medical History:  Diagnosis Date  . Acute on chronic appendicitis s/p lap appy 06/10/2012 06/10/2012   surgery done 06-10-12  . Arthritis    ankle  . Back pain   . Chest pain   . Complication of anesthesia   . Constipation   . Diabetes mellitus    TYPE 2- no meds in several months-was taken off meds -monitoring blood sugar.  Marland Kitchen GERD (gastroesophageal reflux disease)   . History of colon polyps   . Hyperlipidemia   . Hypertension   . Joint pain   . Lactose intolerance   . Morbid obesity - BMI > 50 12/13/2012  . Pneumonia    none recent  . PONV (postoperative nausea and vomiting)   . Poor venous access    "usually difficult stick"  . Prediabetes   . Sinusitis   . SOB (shortness of breath)   . Swelling   . Vitamin D deficiency     Current Outpatient Medications on File Prior to Visit  Medication Sig Dispense Refill  . amLODipine (NORVASC) 5 MG tablet Take 1 tablet (5 mg total) by mouth daily. 90 tablet 3  . carvedilol (COREG) 12.5 MG tablet Take 1 tablet (12.5 mg total) by mouth 2 (two) times daily with a meal. 180 tablet 1  . fluticasone (FLONASE) 50 MCG/ACT nasal spray Place 2 sprays into both nostrils daily. 16 g 6  . furosemide (LASIX) 40 MG tablet TAKE 1 TABLET BY MOUTH  DAILY 90 tablet 3  . glucose blood test strip Check blood sugar twice daily 100 each 11  . loratadine (CLARITIN) 10 MG tablet Take 1 tablet (10 mg total) by mouth daily. 90 tablet 3  . methocarbamol (ROBAXIN) 500 MG tablet Take 1 tablet (500 mg total) by mouth every 6 (six) hours as needed for muscle spasms. 45 tablet 0  . Multiple Vitamins-Minerals (MULTIVITAMIN ADULT PO) Take 1 tablet daily by mouth. flintstone vitamins    . NONFORMULARY OR  COMPOUNDED ITEM Thigh high compression socks  20-30 mmhg    Re- low ext edema 1 each 1  . pantoprazole (PROTONIX) 40 MG tablet Take 2 tablets (80 mg total) by mouth daily. 180 tablet 3  . Vitamin D, Ergocalciferol, (DRISDOL) 1.25 MG (50000 UT) CAPS capsule Take 1 capsule (50,000 Units total) by mouth every 7 (seven) days. 4 capsule 0   No current facility-administered medications on file prior to visit.     Past Surgical History:  Procedure Laterality Date  . ABDOMINAL HYSTERECTOMY    . APPENDECTOMY     2'14  . COLONOSCOPY  04/11/11   5 mm polyp removed but not recovered  . COLONOSCOPY WITH PROPOFOL N/A 05/11/2017   Procedure: COLONOSCOPY WITH PROPOFOL;  Surgeon: Gatha Mayer, MD;  Location: [facility];  Service: Endoscopy;  Laterality: N/A;  . GASTRIC ROUX-EN-Y N/A 02/27/2015   Procedure: LAPAROSCOPIC ROUX-EN-Y GASTRIC BYPASS WITH UPPER ENDOSCOPY;  Surgeon: Johnathan Hausen, MD;  Location: [facility];  Service: General;  Laterality: N/A;  . KNEE SURGERY Right     KNEE SURGERY   . LAPAROSCOPIC APPENDECTOMY  06/10/2012   Procedure: APPENDECTOMY LAPAROSCOPIC;  Surgeon: Adin Hector, MD;  Location: [facility];  Service: General;  Laterality: N/A;  . UPPER GASTROINTESTINAL ENDOSCOPY  04/14/2006   Normal    Allergies  Allergen Reactions  . Ace Inhibitors Anaphylaxis    Bowel angioedema  . Cheese Nausea And Vomiting  . Red Dye Nausea And Vomiting  . [name] Flash [Fish Allergy] Nausea And Vomiting    Social History   Socioeconomic History  . Marital status: Married    Spouse name: Dorothyann Peng  . Number of children: 0  . Years of education: Not on file  . Highest education level: Not on file  Occupational History  . 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[employer]  . Financial resource strain: Not on file  . Food insecurity    Worry: Not on file    Inability: Not on file  . Transportation needs    Medical: Not on file    Non-medical: Not on file  Tobacco Use  .  Smoking status: Never Smoker  . Smokeless tobacco: Never Used  Substance and Sexual Activity  . Alcohol use: No    Alcohol/week: 0.0 standard drinks  . Drug use: No  . Sexual activity: Yes    Partners: Male  Lifestyle  . Physical activity    Days per week: Not on file    Minutes per session: Not on file  . Stress: Not on file  Relationships  . Social Herbalist on phone: Not on file    Gets together: Not on file    Attends religious service: Not on file    Active member of club or organization: Not on file    Attends meetings of clubs or organizations: Not on file    Relationship status: Not on file  . Intimate partner violence    Fear of current or ex partner: Not on file    Emotionally abused: Not on file    Physically abused: Not on file    Forced sexual activity: Not on file  Other Topics Concern  . Not on file  Social History Narrative   Exercise- no    Family History  Problem Relation Age of Onset  . Hyperlipidemia Mother   . Hypertension Mother   . Cancer Mother 56       hodgkins lymphoma  . Diabetes Father   . Heart failure Father   . Heart disease Father   . Hyperlipidemia Brother   . Diabetes Paternal Grandmother   . Breast cancer Paternal Aunt        unsure of age  . Breast cancer Paternal Aunt   . Colon cancer Neg Hx   . Heart attack Neg Hx   . Sudden death Neg Hx   . Colon polyps Neg Hx   . Esophageal cancer Neg Hx   . Stomach cancer Neg Hx   . Rectal cancer Neg Hx     BP (!) 149/67   Ht 5\' 6"  (1.676 m)   Wt (!) 327 lb (148.3 kg)   BMI 52.78 kg/m   Review of Systems: See HPI above.     Objective:  Physical Exam:  Gen: NAD, comfortable in exam room  Knee exam not repeated today.   Assessment & Plan:  1. Right knee arthritis - first gelsyn injection given into right knee today.  F/u in 1 week for second injection.  After informed written consent timeout was performed, patient was lying supine on exam table. Right knee was  prepped with alcohol swab and utilizing superolateral approach, patient's  right knee was injected intraarticularly with 37mL bupivicaine followed by gelsyn-3. Patient tolerated the procedure well without immediate complications.

## 2018-11-17 ENCOUNTER — Encounter: Payer: Self-pay | Admitting: Family Medicine

## 2018-11-17 ENCOUNTER — Ambulatory Visit (INDEPENDENT_AMBULATORY_CARE_PROVIDER_SITE_OTHER): Payer: 59 | Admitting: Family Medicine

## 2018-11-17 ENCOUNTER — Other Ambulatory Visit: Payer: Self-pay

## 2018-11-17 DIAGNOSIS — M1711 Unilateral primary osteoarthritis, right knee: Secondary | ICD-10-CM | POA: Diagnosis not present

## 2018-11-17 NOTE — Progress Notes (Signed)
PCP: [name], DO  Subjective:   HPI: Patient is a 58 y.o. female here for right knee arthritis.  7/6: Patient here to start gelsyn series for right knee - did well with series for left knee earlier this year. No skin changes, other new complaints.  7/15: Patient returns for second gelsyn injection.  She's doing well. No skin changes.  Past Medical History:  Diagnosis Date  . Acute on chronic appendicitis s/p lap appy 06/10/2012 06/10/2012   surgery done 06-10-12  . Arthritis    ankle  . Back pain   . Chest pain   . Complication of anesthesia   . Constipation   . Diabetes mellitus    TYPE 2- no meds in several months-was taken off meds -monitoring blood sugar.  Marland Kitchen GERD (gastroesophageal reflux disease)   . History of colon polyps   . Hyperlipidemia   . Hypertension   . Joint pain   . Lactose intolerance   . Morbid obesity - BMI > 50 12/13/2012  . Pneumonia    none recent  . PONV (postoperative nausea and vomiting)   . Poor venous access    "usually difficult stick"  . Prediabetes   . Sinusitis   . SOB (shortness of breath)   . Swelling   . Vitamin D deficiency     Current Outpatient Medications on File Prior to Visit  Medication Sig Dispense Refill  . amLODipine (NORVASC) 5 MG tablet Take 1 tablet (5 mg total) by mouth daily. 90 tablet 3  . carvedilol (COREG) 12.5 MG tablet Take 1 tablet (12.5 mg total) by mouth 2 (two) times daily with a meal. 180 tablet 1  . fluticasone (FLONASE) 50 MCG/ACT nasal spray Place 2 sprays into both nostrils daily. 16 g 6  . furosemide (LASIX) 40 MG tablet TAKE 1 TABLET BY MOUTH  DAILY 90 tablet 3  . glucose blood test strip Check blood sugar twice daily 100 each 11  . loratadine (CLARITIN) 10 MG tablet Take 1 tablet (10 mg total) by mouth daily. 90 tablet 3  . methocarbamol (ROBAXIN) 500 MG tablet Take 1 tablet (500 mg total) by mouth every 6 (six) hours as needed for muscle spasms. 45 tablet 0  . Multiple Vitamins-Minerals  (MULTIVITAMIN ADULT PO) Take 1 tablet daily by mouth. flintstone vitamins    . NONFORMULARY OR COMPOUNDED ITEM Thigh high compression socks  20-30 mmhg    Re- low ext edema 1 each 1  . pantoprazole (PROTONIX) 40 MG tablet Take 2 tablets (80 mg total) by mouth daily. 180 tablet 3  . Vitamin D, Ergocalciferol, (DRISDOL) 1.25 MG (50000 UT) CAPS capsule Take 1 capsule (50,000 Units total) by mouth every 7 (seven) days. 4 capsule 0   No current facility-administered medications on file prior to visit.     Past Surgical History:  Procedure Laterality Date  . ABDOMINAL HYSTERECTOMY    . APPENDECTOMY     2'14  . COLONOSCOPY  04/11/11   5 mm polyp removed but not recovered  . COLONOSCOPY WITH PROPOFOL N/A 05/11/2017   Procedure: COLONOSCOPY WITH PROPOFOL;  Surgeon: Gatha Mayer, MD;  Location: [facility];  Service: Endoscopy;  Laterality: N/A;  . GASTRIC ROUX-EN-Y N/A 02/27/2015   Procedure: LAPAROSCOPIC ROUX-EN-Y GASTRIC BYPASS WITH UPPER ENDOSCOPY;  Surgeon: Johnathan Hausen, MD;  Location: [facility];  Service: General;  Laterality: N/A;  . KNEE SURGERY Right     KNEE SURGERY   . LAPAROSCOPIC APPENDECTOMY  06/10/2012  Procedure: APPENDECTOMY LAPAROSCOPIC;  Surgeon: Adin Hector, MD;  Location: [facility];  Service: General;  Laterality: N/A;  . UPPER GASTROINTESTINAL ENDOSCOPY  04/14/2006   Normal    Allergies  Allergen Reactions  . Ace Inhibitors Anaphylaxis    Bowel angioedema  . Cheese Nausea And Vomiting  . Red Dye Nausea And Vomiting  . [name] Flash [Fish Allergy] Nausea And Vomiting    Social History   Socioeconomic History  . Marital status: Married    Spouse name: Dorothyann Peng  . Number of children: 0  . Years of education: Not on file  . Highest education level: Not on file  Occupational History  . 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 Financial resource strain: Not on file  . Food insecurity    Worry: Not on file    Inability: Not on file   . Transportation needs    Medical: Not on file    Non-medical: Not on file  Tobacco Use  . Smoking status: Never Smoker  . Smokeless tobacco: Never Used  Substance and Sexual Activity  . Alcohol use: No    Alcohol/week: 0.0 standard drinks  . Drug use: No  . Sexual activity: Yes    Partners: Male  Lifestyle  . Physical activity    Days per week: Not on file    Minutes per session: Not on file  . Stress: Not on file  Relationships  . Social Herbalist on phone: Not on file    Gets together: Not on file    Attends religious service: Not on file    Active member of club or organization: Not on file    Attends meetings of clubs or organizations: Not on file    Relationship status: Not on file  . Intimate partner violence    Fear of current or ex partner: Not on file    Emotionally abused: Not on file    Physically abused: Not on file    Forced sexual activity: Not on file  Other Topics Concern  . Not on file  Social History Narrative   Exercise- no    Family History  Problem Relation Age of Onset  . Hyperlipidemia Mother   . Hypertension Mother   . Cancer Mother 62       hodgkins lymphoma  . Diabetes Father   . Heart failure Father   . Heart disease Father   . Hyperlipidemia Brother   . Diabetes Paternal Grandmother   . Breast cancer Paternal Aunt        unsure of age  . Breast cancer Paternal Aunt   . Colon cancer Neg Hx   . Heart attack Neg Hx   . Sudden death Neg Hx   . Colon polyps Neg Hx   . Esophageal cancer Neg Hx   . Stomach cancer Neg Hx   . Rectal cancer Neg Hx     BP 124/70   Review of Systems: See HPI above.     Objective:  Physical Exam:  Gen: NAD, comfortable in exam room  Knee exam not repeated today.   Assessment & Plan:  1. Right knee arthritis - second gelsyn injection given into right knee today.  F/u in 1 week for third injection.  After informed written consent timeout was performed, patient was lying supine on  exam table. Right knee was prepped with alcohol swab and utilizing superolateral approach with ultrasound guidance, patient's right  knee was injected intraarticularly with 58mL bupivicaine followed by gelsyn-3. Patient tolerated the procedure well without immediate complications.

## 2018-11-18 ENCOUNTER — Other Ambulatory Visit (INDEPENDENT_AMBULATORY_CARE_PROVIDER_SITE_OTHER): Payer: Self-pay | Admitting: Family Medicine

## 2018-11-18 DIAGNOSIS — E559 Vitamin D deficiency, unspecified: Secondary | ICD-10-CM

## 2018-11-22 ENCOUNTER — Other Ambulatory Visit: Payer: Self-pay

## 2018-11-22 ENCOUNTER — Encounter (INDEPENDENT_AMBULATORY_CARE_PROVIDER_SITE_OTHER): Payer: Self-pay | Admitting: Family Medicine

## 2018-11-22 ENCOUNTER — Ambulatory Visit (INDEPENDENT_AMBULATORY_CARE_PROVIDER_SITE_OTHER): Payer: 59 | Admitting: Family Medicine

## 2018-11-22 VITALS — BP 124/75 | HR 58 | Temp 98.1°F | Ht 65.0 in | Wt 334.0 lb

## 2018-11-22 DIAGNOSIS — E1165 Type 2 diabetes mellitus with hyperglycemia: Secondary | ICD-10-CM | POA: Diagnosis not present

## 2018-11-22 DIAGNOSIS — E559 Vitamin D deficiency, unspecified: Secondary | ICD-10-CM | POA: Diagnosis not present

## 2018-11-22 DIAGNOSIS — K219 Gastro-esophageal reflux disease without esophagitis: Secondary | ICD-10-CM

## 2018-11-22 DIAGNOSIS — R1013 Epigastric pain: Secondary | ICD-10-CM | POA: Diagnosis not present

## 2018-11-22 DIAGNOSIS — Z9189 Other specified personal risk factors, not elsewhere classified: Secondary | ICD-10-CM

## 2018-11-22 MED ORDER — VITAMIN D (ERGOCALCIFEROL) 1.25 MG (50000 UNIT) PO CAPS: 50000.0000 [IU] | ORAL_CAPSULE | ORAL | 0 refills | Status: DC

## 2018-11-22 MED ORDER — PANTOPRAZOLE SODIUM 40 MG PO TBEC: 80.0000 mg | DELAYED_RELEASE_TABLET | Freq: Every day | ORAL | 3 refills | Status: DC

## 2018-11-22 MED ORDER — METFORMIN HCL 500 MG PO TABS: 500.0000 mg | ORAL_TABLET | Freq: Every day | ORAL | 0 refills | Status: DC

## 2018-11-25 NOTE — Progress Notes (Signed)
Office: [phone]  /  Fax: [phone]   HPI:   Chief Complaint: OBESITY Tammy [name] is here to discuss her progress with her obesity treatment plan. She is on the Pescatarian eating plan +300 calories and she is following her eating plan approximately 60 to 70 % of the time. She states she is just working for exercise. Tammy [name]'s last few weeks have been a bit challenging, but she states she has no complaints. Her next gel injection in her knee is 11/26/18. Tammy [name] doesn't like cottage cheese, so she is looking for substitutions. Her weight is (!) 334 lb (151.5 kg) today and has had a weight gain of 4 pounds since her last in-office visit. She has lost 0 lbs since starting treatment with [name].  Diabetes II Tammy [name] has a diagnosis of diabetes type II. She is not on Victoza anymore. Tammy [name] is on metformin every 48 hours. She has been working on intensive lifestyle modifications including diet, exercise, and weight loss to help control her blood glucose levels. Tammy [name] denies any hypoglycemic episodes.  Vitamin D deficiency Tammy [name] has a diagnosis of vitamin D deficiency. She is currently taking vit D. Tammy [name] admits fatigue and she denies nausea, vomiting or muscle weakness.  At risk for osteopenia and osteoporosis Tammy [name] is at higher risk of osteopenia and osteoporosis due to vitamin D deficiency.   Dyspepsia Tammy [name] has a diagnosis of dyspepsia and her symptoms are better controlled with Protonix.  ASSESSMENT AND PLAN:  Type 2 diabetes mellitus with hyperglycemia, without long-term current use of insulin (HCC)  Vitamin D deficiency - Plan: Vitamin D, Ergocalciferol, (DRISDOL) 1.25 MG (50000 UT) CAPS capsule  Dyspepsia  At risk for osteoporosis  Gastroesophageal reflux disease, esophagitis presence not specified - Plan: pantoprazole (PROTONIX) 40 MG tablet  PLAN:  Diabetes II Tammy [name] has been given extensive diabetes education by myself today including ideal fasting and post-prandial  blood glucose readings, individual ideal Hgb A1c goals and hypoglycemia prevention. We discussed the importance of good blood sugar control to decrease the likelihood of diabetic complications such as nephropathy, neuropathy, limb loss, blindness, coronary artery disease, and death. We discussed the importance of intensive lifestyle modification including diet, exercise and weight loss as the first line treatment for diabetes. Tammy [name] agrees to increase metformin to 500 mg daily and follow up at the agreed upon time.  Vitamin D Deficiency Tammy [name] was informed that low vitamin D levels contributes to fatigue and are associated with obesity, breast, and colon cancer. She agrees to continue to take prescription Vit D @50 ,000 IU every week #4 with no refills and will follow up for routine testing of vitamin D, at least 2-3 times per year. She was informed of the risk of over-replacement of vitamin D and agrees to not increase her dose unless she discusses this with [name] first. Tammy [name] agrees to follow up with our clinic in 2 weeks.  At risk for osteopenia and osteoporosis Tammy [name] was given extended  (15 minutes) osteoporosis prevention counseling today. Tammy [name] is at risk for osteopenia and osteoporosis due to her vitamin D deficiency. She was encouraged to take her vitamin D and follow her higher calcium diet and increase strengthening exercise to help strengthen her bones and decrease her risk of osteopenia and osteoporosis.  Dyspepsia Tammy [name] agrees to continue Protonix 40 mg BID #180 with no refills (to [facility]) and follow up as directed.  Obesity Tammy [name] is currently in the action stage of change. As such, her goal is to continue with weight loss efforts  She has agreed to follow the Pescatarian eating plan +300 calories Tammy [name] has been instructed to work up to a goal of 150 minutes of combined cardio and strengthening exercise per week for weight loss and overall health benefits. We discussed the following  Behavioral Modification Strategies today: planning for success, keeping healthy foods in the home, increasing lean protein intake, increasing vegetables and work on meal planning and easy cooking plans  Tammy [name] has agreed to follow up with our clinic in 2 weeks. She was informed of the importance of frequent follow up visits to maximize her success with intensive lifestyle modifications for her multiple health conditions.  ALLERGIES: Allergies  Allergen Reactions  . Ace Inhibitors Anaphylaxis    Bowel angioedema  . Cheese Nausea And Vomiting  . Red Dye Nausea And Vomiting  . [name] Flash [Fish Allergy] Nausea And Vomiting    MEDICATIONS: Current Outpatient Medications on File Prior to Visit  Medication Sig Dispense Refill  . amLODipine (NORVASC) 5 MG tablet Take 1 tablet (5 mg total) by mouth daily. 90 tablet 3  . carvedilol (COREG) 12.5 MG tablet Take 1 tablet (12.5 mg total) by mouth 2 (two) times daily with a meal. 180 tablet 1  . fluticasone (FLONASE) 50 MCG/ACT nasal spray Place 2 sprays into both nostrils daily. 16 g 6  . furosemide (LASIX) 40 MG tablet TAKE 1 TABLET BY MOUTH  DAILY 90 tablet 3  . glucose blood test strip Check blood sugar twice daily 100 each 11  . loratadine (CLARITIN) 10 MG tablet Take 1 tablet (10 mg total) by mouth daily. 90 tablet 3  . methocarbamol (ROBAXIN) 500 MG tablet Take 1 tablet (500 mg total) by mouth every 6 (six) hours as needed for muscle spasms. 45 tablet 0  . Multiple Vitamins-Minerals (MULTIVITAMIN ADULT PO) Take 1 tablet daily by mouth. flintstone vitamins    . NONFORMULARY OR COMPOUNDED ITEM Thigh high compression socks  20-30 mmhg    Re- low ext edema 1 each 1   No current facility-administered medications on file prior to visit.     PAST MEDICAL HISTORY: Past Medical History:  Diagnosis Date  . Acute on chronic appendicitis s/p lap appy 06/10/2012 06/10/2012   surgery done 06-10-12  . Arthritis    ankle  . Back pain   . Chest pain   .  Complication of anesthesia   . Constipation   . Diabetes mellitus    TYPE 2- no meds in several months-was taken off meds -monitoring blood sugar.  Marland Kitchen GERD (gastroesophageal reflux disease)   . History of colon polyps   . Hyperlipidemia   . Hypertension   . Joint pain   . Lactose intolerance   . Morbid obesity - BMI > 50 12/13/2012  . Pneumonia    none recent  . PONV (postoperative nausea and vomiting)   . Poor venous access    "usually difficult stick"  . Prediabetes   . Sinusitis   . SOB (shortness of breath)   . Swelling   . Vitamin D deficiency     PAST SURGICAL HISTORY: Past Surgical History:  Procedure Laterality Date  . ABDOMINAL HYSTERECTOMY    . APPENDECTOMY     2'14  . COLONOSCOPY  04/11/11   5 mm polyp removed but not recovered  . COLONOSCOPY WITH PROPOFOL N/A 05/11/2017   Procedure: COLONOSCOPY WITH PROPOFOL;  Surgeon: Gatha Mayer, MD;  Location: [facility];  Service: Endoscopy;  Laterality: N/A;  . GASTRIC ROUX-EN-Y N/A 02/27/2015  Procedure: LAPAROSCOPIC ROUX-EN-Y GASTRIC BYPASS WITH UPPER ENDOSCOPY;  Surgeon: Johnathan Hausen, MD;  Location: [facility];  Service: General;  Laterality: N/A;  . KNEE SURGERY Right     KNEE SURGERY   . LAPAROSCOPIC APPENDECTOMY  06/10/2012   Procedure: APPENDECTOMY LAPAROSCOPIC;  Surgeon: Adin Hector, MD;  Location: [facility];  Service: General;  Laterality: N/A;  . UPPER GASTROINTESTINAL ENDOSCOPY  04/14/2006   Normal    SOCIAL HISTORY: Social History   Tobacco Use  . Smoking status: Never Smoker  . Smokeless tobacco: Never Used  Substance Use Topics  . Alcohol use: No    Alcohol/week: 0.0 standard drinks  . Drug use: No    FAMILY HISTORY: Family History  Problem Relation Age of Onset  . Hyperlipidemia Mother   . Hypertension Mother   . Cancer Mother 13       hodgkins lymphoma  . Diabetes Father   . Heart failure Father   . Heart disease Father   . Hyperlipidemia Brother   . Diabetes Paternal Grandmother   .  Breast cancer Paternal Aunt        unsure of age  . Breast cancer Paternal Aunt   . Colon cancer Neg Hx   . Heart attack Neg Hx   . Sudden death Neg Hx   . Colon polyps Neg Hx   . Esophageal cancer Neg Hx   . Stomach cancer Neg Hx   . Rectal cancer Neg Hx     ROS: Review of Systems  Constitutional: Positive for malaise/fatigue. Negative for weight loss.  Gastrointestinal: Negative for nausea and vomiting.  Musculoskeletal:       Negative for muscle weakness    PHYSICAL EXAM: Blood pressure 124/75, pulse (!) 58, temperature 98.1 F (36.7 C), temperature source Oral, height 5\' 5"  (1.651 m), weight (!) 334 lb (151.5 kg), SpO2 96 %. Body mass index is 55.58 kg/m. Physical Exam Vitals signs reviewed.  Constitutional:      Appearance: Normal appearance. She is well-developed. She is obese.  Cardiovascular:     Rate and Rhythm: Normal rate.  Pulmonary:     Effort: Pulmonary effort is normal.  Musculoskeletal: Normal range of motion.  Skin:    General: Skin is warm and dry.  Neurological:     Mental Status: She is alert and oriented to person, place, and time.  Psychiatric:        Mood and Affect: Mood normal.        Behavior: Behavior normal.     RECENT LABS AND TESTS: BMET    Component Value Date/Time   NA 140 10/08/2018 1431   NA 140 06/03/2018 1404   K 3.8 10/08/2018 1431   CL 105 10/08/2018 1431   CO2 25 10/08/2018 1431   GLUCOSE 90 10/08/2018 1431   BUN 14 10/08/2018 1431   BUN 12 06/03/2018 1404   CREATININE 0.95 10/08/2018 1431   CALCIUM 8.4 (L) 10/08/2018 1431   GFRNONAA 71 06/03/2018 1404   GFRAA 82 06/03/2018 1404   Lab Results  Component Value Date   HGBA1C 5.9 (H) 10/08/2018   HGBA1C 6.1 (H) 06/03/2018   HGBA1C 6.1 03/10/2018   HGBA1C 5.9 08/07/2017   HGBA1C 5.8 11/28/2016   Lab Results  Component Value Date   INSULIN 6.9 06/03/2018   CBC    Component Value Date/Time   WBC 2.7 (L) 10/08/2018 1431   RBC 4.44 10/08/2018 1431   HGB 12.6  10/08/2018 1431   HGB 12.9 06/03/2018 1404  HCT 39.1 10/08/2018 1431   HCT 37.9 06/03/2018 1404   PLT 254 10/08/2018 1431   MCV 88.1 10/08/2018 1431   MCV 85 06/03/2018 1404   MCH 28.4 10/08/2018 1431   MCHC 32.2 10/08/2018 1431   RDW 13.8 10/08/2018 1431   RDW 13.5 06/03/2018 1404   LYMPHSABS 937 10/08/2018 1431   LYMPHSABS 1.4 06/03/2018 1404   MONOABS 0.4 08/07/2017 0711   EOSABS 51 10/08/2018 1431   EOSABS 0.1 06/03/2018 1404   BASOSABS 11 10/08/2018 1431   BASOSABS 0.0 06/03/2018 1404   Iron/TIBC/Ferritin/ %Sat No results found for: IRON, TIBC, FERRITIN, IRONPCTSAT Lipid Panel     Component Value Date/Time   CHOL 183 06/03/2018 1404   TRIG 49 06/03/2018 1404   HDL 68 06/03/2018 1404   CHOLHDL 3 03/10/2018 0814   VLDL 10.4 03/10/2018 0814   LDLCALC 105 (H) 06/03/2018 1404   LDLDIRECT 129.1 10/17/2009 0825   Hepatic Function Panel     Component Value Date/Time   PROT 7.0 10/08/2018 1431   PROT 7.0 06/03/2018 1404   ALBUMIN 4.3 06/03/2018 1404   AST 15 10/08/2018 1431   ALT 15 10/08/2018 1431   ALKPHOS 100 06/03/2018 1404   BILITOT 0.5 10/08/2018 1431   BILITOT 0.5 06/03/2018 1404   BILIDIR 0.0 05/02/2014 1503      Component Value Date/Time   TSH 1.850 06/03/2018 1404   TSH 2.89 08/07/2017 0711   TSH 3.181 02/21/2014 1038     Ref. Range 06/03/2018 14:04  Vitamin D, 25-Hydroxy Latest Ref Range: 30.0 - 100.0 ng/mL 6.8 (L)    OBESITY BEHAVIORAL INTERVENTION VISIT  Today's visit was # 10   Starting weight: 334 lbs Starting date: 05/25/2018 Today's weight : 334 lbs  Today's date: 11/22/2018 Total lbs lost to date: 0    11/22/2018  Height 5\' 5"  (1.651 m)  Weight 334 lb (151.5 kg) (A)  BMI (Calculated) 55.58  BLOOD PRESSURE - SYSTOLIC 116  BLOOD PRESSURE - DIASTOLIC 75   Body Fat % 57.9 %     ASK: We discussed the diagnosis of obesity with Tammy [name] today and Tammy [name] agreed to give [name] permission to discuss obesity behavioral modification  therapy today.  ASSESS: Tammy [name] has the diagnosis of obesity and her BMI today is 55.58 Tammy [name] is in the action stage of change   ADVISE: Tammy [name] was educated on the multiple health risks of obesity as well as the benefit of weight loss to improve her health. She was advised of the need for long term treatment and the importance of lifestyle modifications to improve her current health and to decrease her risk of future health problems.  AGREE: Multiple dietary modification options and treatment options were discussed and  Tammy [name] agreed to follow the recommendations documented in the above note.  ARRANGE: Tammy [name] was educated on the importance of frequent visits to treat obesity as outlined per CMS and USPSTF guidelines and agreed to schedule her next follow up appointment today.  I, Doreene Nest, am acting as transcriptionist for Eber Jones, MD  I have reviewed the above documentation for accuracy and completeness, and I agree with the above. - Ilene Qua, MD

## 2018-11-26 ENCOUNTER — Other Ambulatory Visit: Payer: Self-pay

## 2018-11-26 ENCOUNTER — Ambulatory Visit (INDEPENDENT_AMBULATORY_CARE_PROVIDER_SITE_OTHER): Payer: 59 | Admitting: Family Medicine

## 2018-11-26 DIAGNOSIS — M1711 Unilateral primary osteoarthritis, right knee: Secondary | ICD-10-CM | POA: Diagnosis not present

## 2018-11-29 ENCOUNTER — Encounter: Payer: Self-pay | Admitting: Family Medicine

## 2018-11-29 NOTE — Progress Notes (Signed)
PCP: [name], DO  Subjective:   HPI: Patient is a 58 y.o. female here for right knee arthritis.  7/6: Patient here to start gelsyn series for right knee - did well with series for left knee earlier this year. No skin changes, other new complaints.  7/15: Patient returns for second gelsyn injection.  She's doing well. No skin changes.  7/24: Patient is doing well. No skin changes.  Past Medical History:  Diagnosis Date  . Acute on chronic appendicitis s/p lap appy 06/10/2012 06/10/2012   surgery done 06-10-12  . Arthritis    ankle  . Back pain   . Chest pain   . Complication of anesthesia   . Constipation   . Diabetes mellitus    TYPE 2- no meds in several months-was taken off meds -monitoring blood sugar.  Marland Kitchen GERD (gastroesophageal reflux disease)   . History of colon polyps   . Hyperlipidemia   . Hypertension   . Joint pain   . Lactose intolerance   . Morbid obesity - BMI > 50 12/13/2012  . Pneumonia    none recent  . PONV (postoperative nausea and vomiting)   . Poor venous access    "usually difficult stick"  . Prediabetes   . Sinusitis   . SOB (shortness of breath)   . Swelling   . Vitamin D deficiency     Current Outpatient Medications on File Prior to Visit  Medication Sig Dispense Refill  . amLODipine (NORVASC) 5 MG tablet Take 1 tablet (5 mg total) by mouth daily. 90 tablet 3  . carvedilol (COREG) 12.5 MG tablet Take 1 tablet (12.5 mg total) by mouth 2 (two) times daily with a meal. 180 tablet 1  . fluticasone (FLONASE) 50 MCG/ACT nasal spray Place 2 sprays into both nostrils daily. 16 g 6  . furosemide (LASIX) 40 MG tablet TAKE 1 TABLET BY MOUTH  DAILY 90 tablet 3  . glucose blood test strip Check blood sugar twice daily 100 each 11  . loratadine (CLARITIN) 10 MG tablet Take 1 tablet (10 mg total) by mouth daily. 90 tablet 3  . metFORMIN (GLUCOPHAGE) 500 MG tablet Take 1 tablet (500 mg total) by mouth daily with breakfast. 30 tablet 0  .  methocarbamol (ROBAXIN) 500 MG tablet Take 1 tablet (500 mg total) by mouth every 6 (six) hours as needed for muscle spasms. 45 tablet 0  . Multiple Vitamins-Minerals (MULTIVITAMIN ADULT PO) Take 1 tablet daily by mouth. flintstone vitamins    . NONFORMULARY OR COMPOUNDED ITEM Thigh high compression socks  20-30 mmhg    Re- low ext edema 1 each 1  . pantoprazole (PROTONIX) 40 MG tablet Take 2 tablets (80 mg total) by mouth daily. 180 tablet 3  . Vitamin D, Ergocalciferol, (DRISDOL) 1.25 MG (50000 UT) CAPS capsule Take 1 capsule (50,000 Units total) by mouth every 7 (seven) days. 4 capsule 0   No current facility-administered medications on file prior to visit.     Past Surgical History:  Procedure Laterality Date  . ABDOMINAL HYSTERECTOMY    . APPENDECTOMY     2'14  . COLONOSCOPY  04/11/11   5 mm polyp removed but not recovered  . COLONOSCOPY WITH PROPOFOL N/A 05/11/2017   Procedure: COLONOSCOPY WITH PROPOFOL;  Surgeon: Gatha Mayer, MD;  Location: [facility];  Service: Endoscopy;  Laterality: N/A;  . GASTRIC ROUX-EN-Y N/A 02/27/2015   Procedure: LAPAROSCOPIC ROUX-EN-Y GASTRIC BYPASS WITH UPPER ENDOSCOPY;  Surgeon: Johnathan Hausen, MD;  Location: [facility];  Service: General;  Laterality: N/A;  . KNEE SURGERY Right     KNEE SURGERY   . LAPAROSCOPIC APPENDECTOMY  06/10/2012   Procedure: APPENDECTOMY LAPAROSCOPIC;  Surgeon: Adin Hector, MD;  Location: [facility];  Service: General;  Laterality: N/A;  . UPPER GASTROINTESTINAL ENDOSCOPY  04/14/2006   Normal    Allergies  Allergen Reactions  . Ace Inhibitors Anaphylaxis    Bowel angioedema  . Cheese Nausea And Vomiting  . Red Dye Nausea And Vomiting  . [name] Flash [Fish Allergy] Nausea And Vomiting    Social History   Socioeconomic History  . Marital status: Married    Spouse name: Dorothyann Peng  . Number of children: 0  . Years of education: Not on file  . Highest education level: Not on file  Occupational History  . Occupation: [employer]  Environmental health practitioner: [employer]  . Financial resource strain: Not on file  . Food insecurity    Worry: Not on file    Inability: Not on file  . Transportation needs    Medical: Not on file    Non-medical: Not on file  Tobacco Use  . Smoking status: Never Smoker  . Smokeless tobacco: Never Used  Substance and Sexual Activity  . Alcohol use: No    Alcohol/week: 0.0 standard drinks  . Drug use: No  . Sexual activity: Yes    Partners: Male  Lifestyle  . Physical activity    Days per week: Not on file    Minutes per session: Not on file  . Stress: Not on file  Relationships  . Social Herbalist on phone: Not on file    Gets together: Not on file    Attends religious service: Not on file    Active member of club or organization: Not on file    Attends meetings of clubs or organizations: Not on file    Relationship status: Not on file  . Intimate partner violence    Fear of current or ex partner: Not on file    Emotionally abused: Not on file    Physically abused: Not on file    Forced sexual activity: Not on file  Other Topics Concern  . Not on file  Social History Narrative   Exercise- no    Family History  Problem Relation Age of Onset  . Hyperlipidemia Mother   . Hypertension Mother   . Cancer Mother 51       hodgkins lymphoma  . Diabetes Father   . Heart failure Father   . Heart disease Father   . Hyperlipidemia Brother   . Diabetes Paternal Grandmother   . Breast cancer Paternal Aunt        unsure of age  . Breast cancer Paternal Aunt   . Colon cancer Neg Hx   . Heart attack Neg Hx   . Sudden death Neg Hx   . Colon polyps Neg Hx   . Esophageal cancer Neg Hx   . Stomach cancer Neg Hx   . Rectal cancer Neg Hx     BP 124/80   Review of Systems: See HPI above.     Objective:  Physical Exam:  Gen: NAD, comfortable in exam room  Knee exam not repeated today.   Assessment & Plan:  1. Right knee arthritis -  third gelsyn injection given into right knee today. Consider 4th injection if not improving as expected otherwise f/u  prn.  After informed written consent timeout was performed, patient was lying supine on exam table.  Right knee was prepped with alcohol swab and utilizing superolateral approach with ultrasound guidance, patient's right knee was injected intraarticularly with 10mL bupivicaine followed by gelsyn-3. Patient tolerated the procedure well without immediate complications.

## 2018-12-06 ENCOUNTER — Ambulatory Visit (INDEPENDENT_AMBULATORY_CARE_PROVIDER_SITE_OTHER): Payer: 59 | Admitting: Family Medicine

## 2018-12-06 ENCOUNTER — Encounter (INDEPENDENT_AMBULATORY_CARE_PROVIDER_SITE_OTHER): Payer: Self-pay | Admitting: Family Medicine

## 2018-12-06 ENCOUNTER — Other Ambulatory Visit: Payer: Self-pay

## 2018-12-06 VITALS — BP 120/75 | HR 52 | Temp 98.3°F | Ht 65.0 in | Wt 331.0 lb

## 2018-12-06 DIAGNOSIS — E559 Vitamin D deficiency, unspecified: Secondary | ICD-10-CM

## 2018-12-06 DIAGNOSIS — Z9189 Other specified personal risk factors, not elsewhere classified: Secondary | ICD-10-CM

## 2018-12-06 DIAGNOSIS — Z6841 Body Mass Index (BMI) 40.0 and over, adult: Secondary | ICD-10-CM

## 2018-12-06 DIAGNOSIS — I10 Essential (primary) hypertension: Secondary | ICD-10-CM | POA: Diagnosis not present

## 2018-12-06 MED ORDER — AMLODIPINE BESYLATE 5 MG PO TABS: 5.0000 mg | ORAL_TABLET | Freq: Every day | ORAL | 0 refills | Status: DC

## 2018-12-06 NOTE — Progress Notes (Signed)
Office: [phone]  /  Fax: [phone]   HPI:   Chief Complaint: OBESITY Tammy [name] is here to discuss her progress with her obesity treatment plan. She is on the Pescatarian eating plan + 300 calories and is following her eating plan approximately 75-80 % of the time. She states she is exercising 0 minutes 0 times per week. [place] voices the last few weeks have been heavy with work. She may have to travel back to her hometown within the next few weeks. She is often having to take food with her which does take a bit more effort. She is having a difficult time finding food on the plan.  Her weight is (!) 331 lb (150.1 kg) today and has had a weight loss of 3 pounds over a period of 2 weeks since her last visit. She has lost 3 lbs since starting treatment with [name].  Hypertension Tammy [name] is a 58 y.o. female with hypertension. Tammy [name]'s blood pressure is controlled. She denies chest pain, chest pressure, or headaches. She is working on weight loss to help control her blood pressure with the goal of decreasing her risk of heart attack and stroke.   At risk for cardiovascular disease Tammy [name] is at a higher than average risk for cardiovascular disease due to obesity and hypertension. She currently denies any chest pain.  Vitamin D Deficiency Tammy [name] has a diagnosis of vitamin D deficiency. She is currently taking prescription Vit D. She notes fatigue and denies nausea, vomiting or muscle weakness.  ASSESSMENT AND PLAN:  Vitamin D deficiency  Essential hypertension - Plan: amLODipine (NORVASC) 5 MG tablet  At risk for heart disease  Class 3 severe obesity with serious comorbidity and body mass index (BMI) of 50.0 to 59.9 in adult, unspecified obesity type ([place])  PLAN:  Hypertension We discussed sodium restriction, working on healthy weight loss, and a regular exercise program as the means to achieve improved blood pressure control. Tammy [name] agreed with this plan and agreed to follow  up as directed. We will continue to monitor her blood pressure as well as her progress with the above lifestyle modifications. Tammy [name] agrees to continue taking amlodipine 5 mg PO daily #90 with no refills. She will watch for signs of hypotension as she continues her lifestyle modifications. Tammy [name] agrees to follow up with our clinic in 2 weeks.  Cardiovascular risk counseling Tammy [name] was given extended (15 minutes) coronary artery disease prevention counseling today. She is 58 y.o. female and has risk factors for heart disease including obesity and hypertension. We discussed intensive lifestyle modifications today with an emphasis on specific weight loss instructions and strategies. Pt was also informed of the importance of increasing exercise and decreasing saturated fats to help prevent heart disease.  Vitamin D Deficiency Tammy [name] was informed that low vitamin D levels contributes to fatigue and are associated with obesity, breast, and colon cancer. Tammy [name] agrees to continue taking prescription Vit D 50,000 IU every week and will follow up for routine testing of vitamin D, at least 2-3 times per year. She was informed of the risk of over-replacement of vitamin D and agrees to not increase her dose unless she discusses this with [name] first. Tammy [name] agrees to follow up with our clinic in 2 weeks.  Obesity Tammy [name] is currently in the action stage of change. As such, her goal is to continue with weight loss efforts She has agreed to follow the Category 2 plan Tammy [name] has been instructed to work up to a goal of 150  minutes of combined cardio and strengthening exercise per week for weight loss and overall health benefits. We discussed the following Behavioral Modification Strategies today: increasing lean protein intake, increasing vegetables and work on meal planning and easy cooking plans, keeping healthy foods in the home, and planning for success   Tammy [name] has agreed to follow up with our clinic in 2 weeks.  She was informed of the importance of frequent follow up visits to maximize her success with intensive lifestyle modifications for her multiple health conditions.  ALLERGIES: Allergies  Allergen Reactions  . Ace Inhibitors Anaphylaxis    Bowel angioedema  . Cheese Nausea And Vomiting  . Red Dye Nausea And Vomiting  . [name] Flash [Fish Allergy] Nausea And Vomiting    MEDICATIONS: Current Outpatient Medications on File Prior to Visit  Medication Sig Dispense Refill  . carvedilol (COREG) 12.5 MG tablet Take 1 tablet (12.5 mg total) by mouth 2 (two) times daily with a meal. 180 tablet 1  . fluticasone (FLONASE) 50 MCG/ACT nasal spray Place 2 sprays into both nostrils daily. 16 g 6  . furosemide (LASIX) 40 MG tablet TAKE 1 TABLET BY MOUTH  DAILY 90 tablet 3  . glucose blood test strip Check blood sugar twice daily 100 each 11  . loratadine (CLARITIN) 10 MG tablet Take 1 tablet (10 mg total) by mouth daily. 90 tablet 3  . metFORMIN (GLUCOPHAGE) 500 MG tablet Take 1 tablet (500 mg total) by mouth daily with breakfast. 30 tablet 0  . methocarbamol (ROBAXIN) 500 MG tablet Take 1 tablet (500 mg total) by mouth every 6 (six) hours as needed for muscle spasms. 45 tablet 0  . Multiple Vitamins-Minerals (MULTIVITAMIN ADULT PO) Take 1 tablet daily by mouth. flintstone vitamins    . NONFORMULARY OR COMPOUNDED ITEM Thigh high compression socks  20-30 mmhg    Re- low ext edema 1 each 1  . pantoprazole (PROTONIX) 40 MG tablet Take 2 tablets (80 mg total) by mouth daily. 180 tablet 3  . Vitamin D, Ergocalciferol, (DRISDOL) 1.25 MG (50000 UT) CAPS capsule Take 1 capsule (50,000 Units total) by mouth every 7 (seven) days. 4 capsule 0   No current facility-administered medications on file prior to visit.     PAST MEDICAL HISTORY: Past Medical History:  Diagnosis Date  . Acute on chronic appendicitis s/p lap appy 06/10/2012 06/10/2012   surgery done 06-10-12  . Arthritis    ankle  . Back pain   . Chest pain   .  Complication of anesthesia   . Constipation   . Diabetes mellitus    TYPE 2- no meds in several months-was taken off meds -monitoring blood sugar.  Marland Kitchen GERD (gastroesophageal reflux disease)   . History of colon polyps   . Hyperlipidemia   . Hypertension   . Joint pain   . Lactose intolerance   . Morbid obesity - BMI > 50 12/13/2012  . Pneumonia    none recent  . PONV (postoperative nausea and vomiting)   . Poor venous access    "usually difficult stick"  . Prediabetes   . Sinusitis   . SOB (shortness of breath)   . Swelling   . Vitamin D deficiency     PAST SURGICAL HISTORY: Past Surgical History:  Procedure Laterality Date  . ABDOMINAL HYSTERECTOMY    . APPENDECTOMY     2'14  . COLONOSCOPY  04/11/11   5 mm polyp removed but not recovered  . COLONOSCOPY WITH PROPOFOL N/A 05/11/2017  Procedure: COLONOSCOPY WITH PROPOFOL;  Surgeon: Gatha Mayer, MD;  Location: [facility];  Service: Endoscopy;  Laterality: N/A;  . GASTRIC ROUX-EN-Y N/A 02/27/2015   Procedure: LAPAROSCOPIC ROUX-EN-Y GASTRIC BYPASS WITH UPPER ENDOSCOPY;  Surgeon: Johnathan Hausen, MD;  Location: [facility];  Service: General;  Laterality: N/A;  . KNEE SURGERY Right     KNEE SURGERY   . LAPAROSCOPIC APPENDECTOMY  06/10/2012   Procedure: APPENDECTOMY LAPAROSCOPIC;  Surgeon: Adin Hector, MD;  Location: [facility];  Service: General;  Laterality: N/A;  . UPPER GASTROINTESTINAL ENDOSCOPY  04/14/2006   Normal    SOCIAL HISTORY: Social History   Tobacco Use  . Smoking status: Never Smoker  . Smokeless tobacco: Never Used  Substance Use Topics  . Alcohol use: No    Alcohol/week: 0.0 standard drinks  . Drug use: No    FAMILY HISTORY: Family History  Problem Relation Age of Onset  . Hyperlipidemia Mother   . Hypertension Mother   . Cancer Mother 13       hodgkins lymphoma  . Diabetes Father   . Heart failure Father   . Heart disease Father   . Hyperlipidemia Brother   . Diabetes Paternal Grandmother   .  Breast cancer Paternal Aunt        unsure of age  . Breast cancer Paternal Aunt   . Colon cancer Neg Hx   . Heart attack Neg Hx   . Sudden death Neg Hx   . Colon polyps Neg Hx   . Esophageal cancer Neg Hx   . Stomach cancer Neg Hx   . Rectal cancer Neg Hx     ROS: Review of Systems  Constitutional: Positive for malaise/fatigue and weight loss.  Cardiovascular: Negative for chest pain.       Negative chest pressure  Gastrointestinal: Negative for nausea and vomiting.  Musculoskeletal:       Negative muscle weakness  Neurological: Negative for headaches.    PHYSICAL EXAM: Blood pressure 120/75, pulse (!) 52, temperature 98.3 F (36.8 C), temperature source Oral, height 5\' 5"  (1.651 m), weight (!) 331 lb (150.1 kg), SpO2 100 %. Body mass index is 55.08 kg/m. Physical Exam Vitals signs reviewed.  Constitutional:      Appearance: Normal appearance. She is obese.  Cardiovascular:     Rate and Rhythm: Normal rate.     Pulses: Normal pulses.  Pulmonary:     Effort: Pulmonary effort is normal.     Breath sounds: Normal breath sounds.  Musculoskeletal: Normal range of motion.  Skin:    General: Skin is warm and dry.  Neurological:     Mental Status: She is alert and oriented to person, place, and time.  Psychiatric:        Mood and Affect: Mood normal.        Behavior: Behavior normal.     RECENT LABS AND TESTS: BMET    Component Value Date/Time   NA 140 10/08/2018 1431   NA 140 06/03/2018 1404   K 3.8 10/08/2018 1431   CL 105 10/08/2018 1431   CO2 25 10/08/2018 1431   GLUCOSE 90 10/08/2018 1431   BUN 14 10/08/2018 1431   BUN 12 06/03/2018 1404   CREATININE 0.95 10/08/2018 1431   CALCIUM 8.4 (L) 10/08/2018 1431   GFRNONAA 71 06/03/2018 1404   GFRAA 82 06/03/2018 1404   Lab Results  Component Value Date   HGBA1C 5.9 (H) 10/08/2018   HGBA1C 6.1 (H) 06/03/2018   HGBA1C 6.1  03/10/2018   HGBA1C 5.9 08/07/2017   HGBA1C 5.8 11/28/2016   Lab Results  Component  Value Date   INSULIN 6.9 06/03/2018   CBC    Component Value Date/Time   WBC 2.7 (L) 10/08/2018 1431   RBC 4.44 10/08/2018 1431   HGB 12.6 10/08/2018 1431   HGB 12.9 06/03/2018 1404   HCT 39.1 10/08/2018 1431   HCT 37.9 06/03/2018 1404   PLT 254 10/08/2018 1431   MCV 88.1 10/08/2018 1431   MCV 85 06/03/2018 1404   MCH 28.4 10/08/2018 1431   MCHC 32.2 10/08/2018 1431   RDW 13.8 10/08/2018 1431   RDW 13.5 06/03/2018 1404   LYMPHSABS 937 10/08/2018 1431   LYMPHSABS 1.4 06/03/2018 1404   MONOABS 0.4 08/07/2017 0711   EOSABS 51 10/08/2018 1431   EOSABS 0.1 06/03/2018 1404   BASOSABS 11 10/08/2018 1431   BASOSABS 0.0 06/03/2018 1404   Iron/TIBC/Ferritin/ %Sat No results found for: IRON, TIBC, FERRITIN, IRONPCTSAT Lipid Panel     Component Value Date/Time   CHOL 183 06/03/2018 1404   TRIG 49 06/03/2018 1404   HDL 68 06/03/2018 1404   CHOLHDL 3 03/10/2018 0814   VLDL 10.4 03/10/2018 0814   LDLCALC 105 (H) 06/03/2018 1404   LDLDIRECT 129.1 10/17/2009 0825   Hepatic Function Panel     Component Value Date/Time   PROT 7.0 10/08/2018 1431   PROT 7.0 06/03/2018 1404   ALBUMIN 4.3 06/03/2018 1404   AST 15 10/08/2018 1431   ALT 15 10/08/2018 1431   ALKPHOS 100 06/03/2018 1404   BILITOT 0.5 10/08/2018 1431   BILITOT 0.5 06/03/2018 1404   BILIDIR 0.0 05/02/2014 1503      Component Value Date/Time   TSH 1.850 06/03/2018 1404   TSH 2.89 08/07/2017 0711   TSH 3.181 02/21/2014 1038      OBESITY BEHAVIORAL INTERVENTION VISIT  Today's visit was # 11   Starting weight: 334 lbs Starting date: 05/25/2018 Today's weight : 331 lbs Today's date: 12/06/2018 Total lbs lost to date: 3    ASK: We discussed the diagnosis of obesity with Tammy [name] today and Tammy [name] agreed to give [name] permission to discuss obesity behavioral modification therapy today.  ASSESS: Tammy [name] has the diagnosis of obesity and her BMI today is 55.08 Tammy [name] is in the action stage of change    ADVISE: Lizeth was educated on the multiple health risks of obesity as well as the benefit of weight loss to improve her health. She was advised of the need for long term treatment and the importance of lifestyle modifications to improve her current health and to decrease her risk of future health problems.  AGREE: Multiple dietary modification options and treatment options were discussed and  [place] agreed to follow the recommendations documented in the above note.  ARRANGE: Isma was educated on the importance of frequent visits to treat obesity as outlined per CMS and USPSTF guidelines and agreed to schedule her next follow up appointment today.  I, Trixie Dredge, am acting as transcriptionist for Ilene Qua, MD  I have reviewed the above documentation for accuracy and completeness, and I agree with the above. - Ilene Qua, MD

## 2018-12-15 ENCOUNTER — Other Ambulatory Visit: Payer: Self-pay

## 2018-12-15 ENCOUNTER — Ambulatory Visit (INDEPENDENT_AMBULATORY_CARE_PROVIDER_SITE_OTHER): Payer: 59 | Admitting: Family Medicine

## 2018-12-15 ENCOUNTER — Encounter: Payer: Self-pay | Admitting: Family Medicine

## 2018-12-15 DIAGNOSIS — M1711 Unilateral primary osteoarthritis, right knee: Secondary | ICD-10-CM | POA: Diagnosis not present

## 2018-12-15 NOTE — Progress Notes (Signed)
PCP: [name], DO  Subjective:   HPI: Patient is a 58 y.o. female here for right knee arthritis.  7/6: Patient here to start gelsyn series for right knee - did well with series for left knee earlier this year. No skin changes, other new complaints.  7/15: Patient returns for second gelsyn injection.  She's doing well. No skin changes.  7/24: Patient is doing well. No skin changes.  8/12: Patient here for fourth gelsyn injection. No skin changes. Only mild relief so far - has not had benefit she saw with series in left knee.  Past Medical History:  Diagnosis Date  . Acute on chronic appendicitis s/p lap appy 06/10/2012 06/10/2012   surgery done 06-10-12  . Arthritis    ankle  . Back pain   . Chest pain   . Complication of anesthesia   . Constipation   . Diabetes mellitus    TYPE 2- no meds in several months-was taken off meds -monitoring blood sugar.  Marland Kitchen GERD (gastroesophageal reflux disease)   . History of colon polyps   . Hyperlipidemia   . Hypertension   . Joint pain   . Lactose intolerance   . Morbid obesity - BMI > 50 12/13/2012  . Pneumonia    none recent  . PONV (postoperative nausea and vomiting)   . Poor venous access    "usually difficult stick"  . Prediabetes   . Sinusitis   . SOB (shortness of breath)   . Swelling   . Vitamin D deficiency     Current Outpatient Medications on File Prior to Visit  Medication Sig Dispense Refill  . amLODipine (NORVASC) 5 MG tablet Take 1 tablet (5 mg total) by mouth daily. 90 tablet 0  . carvedilol (COREG) 12.5 MG tablet Take 1 tablet (12.5 mg total) by mouth 2 (two) times daily with a meal. 180 tablet 1  . fluticasone (FLONASE) 50 MCG/ACT nasal spray Place 2 sprays into both nostrils daily. 16 g 6  . furosemide (LASIX) 40 MG tablet TAKE 1 TABLET BY MOUTH  DAILY 90 tablet 3  . glucose blood test strip Check blood sugar twice daily 100 each 11  . loratadine (CLARITIN) 10 MG tablet Take 1 tablet (10 mg total)  by mouth daily. 90 tablet 3  . metFORMIN (GLUCOPHAGE) 500 MG tablet Take 1 tablet (500 mg total) by mouth daily with breakfast. 30 tablet 0  . methocarbamol (ROBAXIN) 500 MG tablet Take 1 tablet (500 mg total) by mouth every 6 (six) hours as needed for muscle spasms. 45 tablet 0  . Multiple Vitamins-Minerals (MULTIVITAMIN ADULT PO) Take 1 tablet daily by mouth. flintstone vitamins    . NONFORMULARY OR COMPOUNDED ITEM Thigh high compression socks  20-30 mmhg    Re- low ext edema 1 each 1  . pantoprazole (PROTONIX) 40 MG tablet Take 2 tablets (80 mg total) by mouth daily. 180 tablet 3  . Vitamin D, Ergocalciferol, (DRISDOL) 1.25 MG (50000 UT) CAPS capsule Take 1 capsule (50,000 Units total) by mouth every 7 (seven) days. 4 capsule 0   No current facility-administered medications on file prior to visit.     Past Surgical History:  Procedure Laterality Date  . ABDOMINAL HYSTERECTOMY    . APPENDECTOMY     2'14  . COLONOSCOPY  04/11/11   5 mm polyp removed but not recovered  . COLONOSCOPY WITH PROPOFOL N/A 05/11/2017   Procedure: COLONOSCOPY WITH PROPOFOL;  Surgeon: Gatha Mayer, MD;  Location: [facility];  Service: Endoscopy;  Laterality: N/A;  . GASTRIC ROUX-EN-Y N/A 02/27/2015   Procedure: LAPAROSCOPIC ROUX-EN-Y GASTRIC BYPASS WITH UPPER ENDOSCOPY;  Surgeon: Johnathan Hausen, MD;  Location: [facility];  Service: General;  Laterality: N/A;  . KNEE SURGERY Right     KNEE SURGERY   . LAPAROSCOPIC APPENDECTOMY  06/10/2012   Procedure: APPENDECTOMY LAPAROSCOPIC;  Surgeon: Adin Hector, MD;  Location: [facility];  Service: General;  Laterality: N/A;  . UPPER GASTROINTESTINAL ENDOSCOPY  04/14/2006   Normal    Allergies  Allergen Reactions  . Ace Inhibitors Anaphylaxis    Bowel angioedema  . Cheese Nausea And Vomiting  . Red Dye Nausea And Vomiting  . [name] Flash [Fish Allergy] Nausea And Vomiting    Social History   Socioeconomic History  . Marital status: Married    Spouse name: Dorothyann Peng  .  Number of children: 0  . Years of education: Not on file  . Highest education level: Not on file  Occupational History  . 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[employer]  . Financial resource strain: Not on file  . Food insecurity    Worry: Not on file    Inability: Not on file  . Transportation needs    Medical: Not on file    Non-medical: Not on file  Tobacco Use  . Smoking status: Never Smoker  . Smokeless tobacco: Never Used  Substance and Sexual Activity  . Alcohol use: No    Alcohol/week: 0.0 standard drinks  . Drug use: No  . Sexual activity: Yes    Partners: Male  Lifestyle  . Physical activity    Days per week: Not on file    Minutes per session: Not on file  . Stress: Not on file  Relationships  . Social Herbalist on phone: Not on file    Gets together: Not on file    Attends religious service: Not on file    Active member of club or organization: Not on file    Attends meetings of clubs or organizations: Not on file    Relationship status: Not on file  . Intimate partner violence    Fear of current or ex partner: Not on file    Emotionally abused: Not on file    Physically abused: Not on file    Forced sexual activity: Not on file  Other Topics Concern  . Not on file  Social History Narrative   Exercise- no    Family History  Problem Relation Age of Onset  . Hyperlipidemia Mother   . Hypertension Mother   . Cancer Mother 70       hodgkins lymphoma  . Diabetes Father   . Heart failure Father   . Heart disease Father   . Hyperlipidemia Brother   . Diabetes Paternal Grandmother   . Breast cancer Paternal Aunt        unsure of age  . Breast cancer Paternal Aunt   . Colon cancer Neg Hx   . Heart attack Neg Hx   . Sudden death Neg Hx   . Colon polyps Neg Hx   . Esophageal cancer Neg Hx   . Stomach cancer Neg Hx   . Rectal cancer Neg Hx     BP (!) 158/72   Wt (!) 320 lb (145.2 kg)   BMI 53.25 kg/m    Review of Systems: See HPI above.     Objective:  Physical  Exam:  Gen: NAD, comfortable in exam room  Knee exam not repeated today.   Assessment & Plan:  1. Right knee arthritis - fourth gelsyn injection given into right knee today.  Discussed if not improving would consider ortho referral to discuss replacement - would likely need to lose weight to become surgical candidate however.  After informed written consent timeout was performed, patient was lying supine on exam table. Right knee was prepped with alcohol swab and utilizing superolateral approach with ultrasound guidance, patient's right knee was injected intraarticularly with 56mL bupivicaine followed by gelsyn. Patient tolerated the procedure well without immediate complications.

## 2018-12-16 ENCOUNTER — Other Ambulatory Visit: Payer: Self-pay | Admitting: Family Medicine

## 2018-12-16 DIAGNOSIS — I10 Essential (primary) hypertension: Secondary | ICD-10-CM

## 2018-12-21 ENCOUNTER — Ambulatory Visit (INDEPENDENT_AMBULATORY_CARE_PROVIDER_SITE_OTHER): Payer: 59 | Admitting: Family Medicine

## 2018-12-21 NOTE — Telephone Encounter (Signed)
Yes--- 2 different meds

## 2018-12-21 NOTE — Telephone Encounter (Signed)
Lookd like on 12/06/18 [facility] put her on amlodipine.  Can she take both?

## 2019-01-04 ENCOUNTER — Other Ambulatory Visit: Payer: Self-pay

## 2019-01-04 ENCOUNTER — Encounter (INDEPENDENT_AMBULATORY_CARE_PROVIDER_SITE_OTHER): Payer: Self-pay | Admitting: Family Medicine

## 2019-01-04 ENCOUNTER — Ambulatory Visit (INDEPENDENT_AMBULATORY_CARE_PROVIDER_SITE_OTHER): Payer: 59 | Admitting: Family Medicine

## 2019-01-04 DIAGNOSIS — E559 Vitamin D deficiency, unspecified: Secondary | ICD-10-CM

## 2019-01-04 DIAGNOSIS — Z6841 Body Mass Index (BMI) 40.0 and over, adult: Secondary | ICD-10-CM

## 2019-01-04 DIAGNOSIS — E1151 Type 2 diabetes mellitus with diabetic peripheral angiopathy without gangrene: Secondary | ICD-10-CM

## 2019-01-04 DIAGNOSIS — I10 Essential (primary) hypertension: Secondary | ICD-10-CM

## 2019-01-04 MED ORDER — VITAMIN D (ERGOCALCIFEROL) 1.25 MG (50000 UNIT) PO CAPS: 50000.0000 [IU] | ORAL_CAPSULE | ORAL | 0 refills | Status: DC

## 2019-01-04 MED ORDER — AMLODIPINE BESYLATE 2.5 MG PO TABS: 2.5000 mg | ORAL_TABLET | Freq: Two times a day (BID) | ORAL | 0 refills | Status: DC

## 2019-01-04 MED ORDER — METFORMIN HCL 500 MG PO TABS: 500.0000 mg | ORAL_TABLET | Freq: Every day | ORAL | 0 refills | Status: DC

## 2019-01-04 NOTE — Progress Notes (Signed)
Office: [phone]  /  Fax: [phone] TeleHealth Visit:  Tammy [name] has verbally consented to this TeleHealth visit today. The patient is located at home, the provider is located at the [facility] office. The participants in this visit include the listed provider and patient. The visit was conducted today via face time.  HPI:   Chief Complaint: OBESITY Tammy [name] is here to discuss her progress with her obesity treatment plan. She is on the Category 2 plan and is following her eating plan approximately 60 % of the time. She states she is walking for 30 minutes 3 times per week. Ardell reports a weight of 326 lbs this morning. Her ankle is very painful and she is avoiding walking for long distances. She notes hunger at night especially nights she is working. She states her BGs range between 95 and 98 in the morning, and 130 and 140 in the evening. We were unable to weigh the patient today for this TeleHealth visit. She feels as if she has lost 5 lbs since her last visit. She has lost 3 lbs since starting treatment with [name].  Hypertension Tammy [name] is a 58 y.o. female with hypertension. Damika's blood pressure is well controlled prior to her last appointment. She notes some discrepancy with medication. She says she has 2 medications of amlodipine and isn't sure which to take. She would rather take medications BID. She denies chest pain. She is working on weight loss to help control her blood pressure with the goal of decreasing her risk of heart attack and stroke.   Vitamin D Deficiency Tammy [name] has a diagnosis of vitamin D deficiency. She is currently taking prescription Vit D. She notes fatigue and denies nausea, vomiting or muscle weakness.  Diabetes II with Hyperglycemia Tammy [name] has a diagnosis of diabetes type II. Dilyn is on metformin and denies GI side effects. She denies hypoglycemia. Last A1c was 5.9. She has been working on intensive lifestyle modifications  including diet, exercise, and weight loss to help control her blood glucose levels.  ASSESSMENT AND PLAN:  DM (diabetes mellitus) type II, controlled, with peripheral vascular disorder (Tammy [name]) - Plan: metFORMIN (GLUCOPHAGE) 500 MG tablet  Vitamin D deficiency - Plan: Vitamin D, Ergocalciferol, (DRISDOL) 1.25 MG (50000 UT) CAPS capsule  Essential hypertension - Plan: amLODipine (NORVASC) 2.5 MG tablet  PLAN:  Hypertension We discussed sodium restriction, working on healthy weight loss, and a regular exercise program as the means to achieve improved blood pressure control. Martin agreed with this plan and agreed to follow up as directed. We will continue to monitor her blood pressure as well as her progress with the above lifestyle modifications. Noelie agrees to continue taking amlodipine 2.5 mg BID #60 and we will refill for 1 month. She will watch for signs of hypotension as she continues her lifestyle modifications. Zahlia agrees to follow up with our clinic in 2 weeks.  Vitamin D Deficiency Tammy [name] was informed that low vitamin D levels contributes to fatigue and are associated with obesity, breast, and colon cancer. Aneisha agrees to continue taking prescription Vit D 50,000 IU every week #4 and we will refill for 1 month. She will follow up for routine testing of vitamin D, at least 2-3 times per year. She was informed of the risk of over-replacement of vitamin D and agrees to not increase her dose unless she discusses this with [name] first. Arieli agrees to follow up with our clinic in 2 weeks.  Diabetes II with Hyperglycemia  Tammy [name] has been given extensive diabetes education by myself today including ideal fasting and post-prandial blood glucose readings, individual ideal Hgb A1c goals and hypoglycemia prevention. We discussed the importance of good blood sugar control to decrease the likelihood of diabetic complications such as nephropathy, neuropathy, limb loss, blindness, coronary artery  disease, and death. We discussed the importance of intensive lifestyle modification including diet, exercise and weight loss as the first line treatment for diabetes. Marvella agrees to continue to continue taking metformin 500 mg PO daily #30 and we will refill for 1 month. Semiko agrees to follow up with our clinic in 2 weeks.  Obesity Tammy [name] is currently in the action stage of change. As such, her goal is to continue with weight loss efforts She has agreed to follow the Category 3 plan Tammy [name] has been instructed to work up to a goal of 150 minutes of combined cardio and strengthening exercise per week for weight loss and overall health benefits. We discussed the following Behavioral Modification Strategies today: increasing lean protein intake, increasing vegetables, work on meal planning and easy cooking plans, avoiding temptations, and keeping healthy foods in the home   Tammy [name] has agreed to follow up with our clinic in 2 weeks. She was informed of the importance of frequent follow up visits to maximize her success with intensive lifestyle modifications for her multiple health conditions.  ALLERGIES: Allergies  Allergen Reactions   Ace Inhibitors Anaphylaxis    Bowel angioedema   Cheese Nausea And Vomiting   Red Dye Nausea And Vomiting   Tuna [Fish Allergy] Nausea And Vomiting    MEDICATIONS: Current Outpatient Medications on File Prior to Visit  Medication Sig Dispense Refill   carvedilol (COREG) 12.5 MG tablet TAKE 1 TABLET BY MOUTH  TWICE DAILY WITH MEALS 180 tablet 3   fluticasone (FLONASE) 50 MCG/ACT nasal spray Place 2 sprays into both nostrils daily. 16 g 6   furosemide (LASIX) 40 MG tablet TAKE 1 TABLET BY MOUTH  DAILY 90 tablet 3   glucose blood test strip Check blood sugar twice daily 100 each 11   loratadine (CLARITIN) 10 MG tablet Take 1 tablet (10 mg total) by mouth daily. 90 tablet 3   Multiple Vitamins-Minerals (MULTIVITAMIN ADULT PO) Take 1 tablet daily by  mouth. flintstone vitamins     NONFORMULARY OR COMPOUNDED ITEM Thigh high compression socks  20-30 mmhg    Re- low ext edema 1 each 1   pantoprazole (PROTONIX) 40 MG tablet Take 2 tablets (80 mg total) by mouth daily. 180 tablet 3   methocarbamol (ROBAXIN) 500 MG tablet Take 1 tablet (500 mg total) by mouth every 6 (six) hours as needed for muscle spasms. (Patient not taking: Reported on 01/04/2019) 45 tablet 0   No current facility-administered medications on file prior to visit.     PAST MEDICAL HISTORY: Past Medical History:  Diagnosis Date   Acute on chronic appendicitis s/p lap appy 06/10/2012 06/10/2012   surgery done 06-10-12   Arthritis    ankle   Back pain    Chest pain    Complication of anesthesia    Constipation    Diabetes mellitus    TYPE 2- no meds in several months-was taken off meds -monitoring blood sugar.   GERD (gastroesophageal reflux disease)    History of colon polyps    Hyperlipidemia    Hypertension    Joint pain    Lactose intolerance    Morbid obesity - BMI > 50 12/13/2012  Pneumonia    none recent   PONV (postoperative nausea and vomiting)    Poor venous access    "usually difficult stick"   Prediabetes    Sinusitis    SOB (shortness of breath)    Swelling    Vitamin D deficiency     PAST SURGICAL HISTORY: Past Surgical History:  Procedure Laterality Date   ABDOMINAL HYSTERECTOMY     APPENDECTOMY     2'14   COLONOSCOPY  04/11/11   5 mm polyp removed but not recovered   COLONOSCOPY WITH PROPOFOL N/A 05/11/2017   Procedure: COLONOSCOPY WITH PROPOFOL;  Surgeon: Gatha Mayer, MD;  Location: [facility];  Service: Endoscopy;  Laterality: N/A;   GASTRIC ROUX-EN-Y N/A 02/27/2015   Procedure: LAPAROSCOPIC ROUX-EN-Y GASTRIC BYPASS WITH UPPER ENDOSCOPY;  Surgeon: Johnathan Hausen, MD;  Location: [facility];  Service: General;  Laterality: N/A;   KNEE SURGERY Right     KNEE SURGERY    LAPAROSCOPIC APPENDECTOMY  06/10/2012    Procedure: APPENDECTOMY LAPAROSCOPIC;  Surgeon: Adin Hector, MD;  Location: [facility];  Service: General;  Laterality: N/A;   UPPER GASTROINTESTINAL ENDOSCOPY  04/14/2006   Normal    SOCIAL HISTORY: Social History   Tobacco Use   Smoking status: Never Smoker   Smokeless tobacco: Never Used  Substance Use Topics   Alcohol use: No    Alcohol/week: 0.0 standard drinks   Drug use: No    FAMILY HISTORY: Family History  Problem Relation Age of Onset   Hyperlipidemia Mother    Hypertension Mother    Cancer Mother 21       hodgkins lymphoma   Diabetes Father    Heart failure Father    Heart disease Father    Hyperlipidemia Brother    Diabetes Paternal Grandmother    Breast cancer Paternal Aunt        unsure of age   Breast cancer Paternal Aunt    Colon cancer Neg Hx    Heart attack Neg Hx    Sudden death Neg Hx    Colon polyps Neg Hx    Esophageal cancer Neg Hx    Stomach cancer Neg Hx    Rectal cancer Neg Hx     ROS: Review of Systems  Constitutional: Positive for malaise/fatigue and weight loss.  Cardiovascular: Negative for chest pain.  Gastrointestinal: Negative for nausea and vomiting.  Musculoskeletal:       Negative muscle weakness  Endo/Heme/Allergies:       Negative hypoglycemia    PHYSICAL EXAM: Pt in no acute distress  RECENT LABS AND TESTS: BMET    Component Value Date/Time   NA 140 10/08/2018 1431   NA 140 06/03/2018 1404   K 3.8 10/08/2018 1431   CL 105 10/08/2018 1431   CO2 25 10/08/2018 1431   GLUCOSE 90 10/08/2018 1431   BUN 14 10/08/2018 1431   BUN 12 06/03/2018 1404   CREATININE 0.95 10/08/2018 1431   CALCIUM 8.4 (L) 10/08/2018 1431   GFRNONAA 71 06/03/2018 1404   GFRAA 82 06/03/2018 1404   Lab Results  Component Value Date   HGBA1C 5.9 (H) 10/08/2018   HGBA1C 6.1 (H) 06/03/2018   HGBA1C 6.1 03/10/2018   HGBA1C 5.9 08/07/2017   HGBA1C 5.8 11/28/2016   Lab Results  Component Value Date   INSULIN 6.9  06/03/2018   CBC    Component Value Date/Time   WBC 2.7 (L) 10/08/2018 1431   RBC 4.44 10/08/2018 1431   HGB 12.6  10/08/2018 1431   HGB 12.9 06/03/2018 1404   HCT 39.1 10/08/2018 1431   HCT 37.9 06/03/2018 1404   PLT 254 10/08/2018 1431   MCV 88.1 10/08/2018 1431   MCV 85 06/03/2018 1404   MCH 28.4 10/08/2018 1431   MCHC 32.2 10/08/2018 1431   RDW 13.8 10/08/2018 1431   RDW 13.5 06/03/2018 1404   LYMPHSABS 937 10/08/2018 1431   LYMPHSABS 1.4 06/03/2018 1404   MONOABS 0.4 08/07/2017 0711   EOSABS 51 10/08/2018 1431   EOSABS 0.1 06/03/2018 1404   BASOSABS 11 10/08/2018 1431   BASOSABS 0.0 06/03/2018 1404   Iron/TIBC/Ferritin/ %Sat No results found for: IRON, TIBC, FERRITIN, IRONPCTSAT Lipid Panel     Component Value Date/Time   CHOL 183 06/03/2018 1404   TRIG 49 06/03/2018 1404   HDL 68 06/03/2018 1404   CHOLHDL 3 03/10/2018 0814   VLDL 10.4 03/10/2018 0814   LDLCALC 105 (H) 06/03/2018 1404   LDLDIRECT 129.1 10/17/2009 0825   Hepatic Function Panel     Component Value Date/Time   PROT 7.0 10/08/2018 1431   PROT 7.0 06/03/2018 1404   ALBUMIN 4.3 06/03/2018 1404   AST 15 10/08/2018 1431   ALT 15 10/08/2018 1431   ALKPHOS 100 06/03/2018 1404   BILITOT 0.5 10/08/2018 1431   BILITOT 0.5 06/03/2018 1404   BILIDIR 0.0 05/02/2014 1503      Component Value Date/Time   TSH 1.850 06/03/2018 1404   TSH 2.89 08/07/2017 0711   TSH 3.181 02/21/2014 1038      I, Trixie Dredge, am acting as Location manager for Ilene Qua, MD  I have reviewed the above documentation for accuracy and completeness, and I agree with the above. - Ilene Qua, MD

## 2019-01-13 ENCOUNTER — Ambulatory Visit (INDEPENDENT_AMBULATORY_CARE_PROVIDER_SITE_OTHER): Payer: 59

## 2019-01-13 ENCOUNTER — Encounter: Payer: Self-pay | Admitting: Podiatry

## 2019-01-13 ENCOUNTER — Other Ambulatory Visit: Payer: Self-pay | Admitting: Podiatry

## 2019-01-13 ENCOUNTER — Other Ambulatory Visit: Payer: Self-pay

## 2019-01-13 ENCOUNTER — Ambulatory Visit (INDEPENDENT_AMBULATORY_CARE_PROVIDER_SITE_OTHER): Payer: 59 | Admitting: Podiatry

## 2019-01-13 DIAGNOSIS — M76821 Posterior tibial tendinitis, right leg: Secondary | ICD-10-CM

## 2019-01-13 DIAGNOSIS — M25571 Pain in right ankle and joints of right foot: Secondary | ICD-10-CM

## 2019-01-14 NOTE — Progress Notes (Signed)
Subjective:   Patient ID: Tammy [name], female   [name]: 58 y.o.   MRN: [id number]   HPI Patient states that she is trying to stay active but she is gaining weight recently and her ankle has started to really bother her over the last few weeks.  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       Objective:  Physical Exam Vitals signs and nursing note reviewed.  Constitutional:      Appearance: She is well-developed.  Pulmonary:     Effort: Pulmonary effort is normal.  Musculoskeletal: Normal range of motion.  Skin:    General: Skin is warm.  Neurological:     Mental Status: She is alert.     Neurovascular status was found to be intact muscle strength was found to be adequate with patient having significant edema in her legs bilateral which has been as hereditary condition and has not been something she is been able to treat.  She is in weight loss currently and did lose a lot of weight but gained back a lot recently and has a lot of pain in the medial side of the right ankle with inflammation fluid noted     Assessment:  Acute posterior tibial tendinitis right with depression of the arch noted with edema and weight is complicating factor     Plan:  H&P x-ray reviewed condition discussed and I went ahead today and did a sterile prep and injected the tendon underneath the medial malleolus 3 mg Dexasone Kenalog 5 mg Xylocaine and advised on lifting the foot and did dispense power step insoles to try to give her support.  Reappoint to recheck  X-ray indicates that there is significant depression of the arch right with tremendous stress on the subtalar midtarsal joint

## 2019-02-10 ENCOUNTER — Ambulatory Visit (INDEPENDENT_AMBULATORY_CARE_PROVIDER_SITE_OTHER): Payer: 59 | Admitting: Podiatry

## 2019-02-10 ENCOUNTER — Encounter: Payer: Self-pay | Admitting: Podiatry

## 2019-02-10 ENCOUNTER — Other Ambulatory Visit: Payer: Self-pay

## 2019-02-10 DIAGNOSIS — M76821 Posterior tibial tendinitis, right leg: Secondary | ICD-10-CM | POA: Diagnosis not present

## 2019-02-10 DIAGNOSIS — M722 Plantar fascial fibromatosis: Secondary | ICD-10-CM | POA: Diagnosis not present

## 2019-02-14 NOTE — Progress Notes (Signed)
Subjective:   Patient ID: Tammy [name], female   [name]: 58 y.o.   MRN: [id number]   HPI Patient states continues to have problems in the medial side of the ankle stating that some better than previous but still sore   ROS      Objective:  Physical Exam  Neurovascular status intact with patient found to be obese with significant swelling of her legs bilateral which is partly a genetic lymphedematous edema type condition     Assessment:  Posterior tibial tendinitis right with a edema and obesity is complicating factors     Plan:  H&P reviewed condition also flatfeet.  At this point I did do a very careful sheath injection right 3 mg Kenalog 5 mg Xylocaine and then discussed orthotics for the long-term and will get a hold off and see how she does with just the injection and will be seen back

## 2019-03-04 ENCOUNTER — Other Ambulatory Visit: Payer: Self-pay

## 2019-03-04 ENCOUNTER — Ambulatory Visit (INDEPENDENT_AMBULATORY_CARE_PROVIDER_SITE_OTHER): Payer: 59 | Admitting: Family Medicine

## 2019-03-04 ENCOUNTER — Encounter: Payer: Self-pay | Admitting: Family Medicine

## 2019-03-04 VITALS — BP 144/70 | HR 67 | Temp 97.5°F | Resp 18 | Ht 65.0 in | Wt 343.0 lb

## 2019-03-04 DIAGNOSIS — E43 Unspecified severe protein-calorie malnutrition: Secondary | ICD-10-CM

## 2019-03-04 DIAGNOSIS — I890 Lymphedema, not elsewhere classified: Secondary | ICD-10-CM | POA: Diagnosis not present

## 2019-03-04 DIAGNOSIS — Z23 Encounter for immunization: Secondary | ICD-10-CM

## 2019-03-04 DIAGNOSIS — J301 Allergic rhinitis due to pollen: Secondary | ICD-10-CM | POA: Diagnosis not present

## 2019-03-04 DIAGNOSIS — K219 Gastro-esophageal reflux disease without esophagitis: Secondary | ICD-10-CM

## 2019-03-04 MED ORDER — FUROSEMIDE 40 MG PO TABS: 40.0000 mg | ORAL_TABLET | Freq: Every day | ORAL | 3 refills | Status: DC

## 2019-03-04 MED ORDER — LORATADINE 10 MG PO TABS: 10.0000 mg | ORAL_TABLET | Freq: Every day | ORAL | 3 refills | Status: DC

## 2019-03-04 MED ORDER — PANTOPRAZOLE SODIUM 40 MG PO TBEC: 80.0000 mg | DELAYED_RELEASE_TABLET | Freq: Every day | ORAL | 3 refills | Status: DC

## 2019-03-04 NOTE — Progress Notes (Signed)
Patient ID: SARHA SASSO, female    [name]: Jul 22, 1960  Age: 58 y.o. MRN: [id number]    Subjective:  Subjective  HPI TERRYL CREEKMORE presents for increased swelling in her legs.  The lasix helps but diet and exercise is not helping at all the the support stockings are not helping much either No pain in the calf, no errythema  Review of Systems  Constitutional: Negative for appetite change, diaphoresis, fatigue and unexpected weight change.  Eyes: Negative for pain, redness and visual disturbance.  Respiratory: Negative for cough, chest tightness, shortness of breath and wheezing.   Cardiovascular: Positive for leg swelling. Negative for chest pain and palpitations.  Endocrine: Negative for cold intolerance, heat intolerance, polydipsia, polyphagia and polyuria.  Genitourinary: Negative for difficulty urinating, dysuria and frequency.  Neurological: Negative for dizziness, light-headedness, numbness and headaches.    History Past Medical History:  Diagnosis Date  . Acute on chronic appendicitis s/p lap appy 06/10/2012 06/10/2012   surgery done 06-10-12  . Arthritis    ankle  . Back pain   . Chest pain   . Complication of anesthesia   . Constipation   . Diabetes mellitus    TYPE 2- no meds in several months-was taken off meds -monitoring blood sugar.  Marland Kitchen GERD (gastroesophageal reflux disease)   . History of colon polyps   . Hyperlipidemia   . Hypertension   . Joint pain   . Lactose intolerance   . Morbid obesity - BMI > 50 12/13/2012  . Pneumonia    none recent  . PONV (postoperative nausea and vomiting)   . Poor venous access    "usually difficult stick"  . Prediabetes   . Sinusitis   . SOB (shortness of breath)   . Swelling   . Vitamin D deficiency     She has a past surgical history that includes Abdominal hysterectomy; Knee surgery (Right); Upper gastrointestinal endoscopy (04/14/2006); Colonoscopy (04/11/11); laparoscopic appendectomy (06/10/2012); Appendectomy; Gastric  Roux-En-Y (N/A, 02/27/2015); and Colonoscopy with propofol (N/A, 05/11/2017).   Her family history includes Breast cancer in her paternal aunt and paternal aunt; Cancer (age of onset: 24) in her mother; Diabetes in her father and paternal grandmother; Heart disease in her father; Heart failure in her father; Hyperlipidemia in her brother and mother; Hypertension in her mother.She reports that she has never smoked. She has never used smokeless tobacco. She reports that she does not drink alcohol or use drugs.  Current Outpatient Medications on File Prior to Visit  Medication Sig Dispense Refill  . amLODipine (NORVASC) 2.5 MG tablet Take 1 tablet (2.5 mg total) by mouth 2 (two) times daily. 60 tablet 0  . carvedilol (COREG) 12.5 MG tablet TAKE 1 TABLET BY MOUTH  TWICE DAILY WITH MEALS 180 tablet 3  . fluticasone (FLONASE) 50 MCG/ACT nasal spray Place 2 sprays into both nostrils daily. 16 g 6  . glucose blood test strip Check blood sugar twice daily 100 each 11  . methocarbamol (ROBAXIN) 500 MG tablet Take 1 tablet (500 mg total) by mouth every 6 (six) hours as needed for muscle spasms. 45 tablet 0  . Multiple Vitamins-Minerals (MULTIVITAMIN ADULT PO) Take 1 tablet daily by mouth. flintstone vitamins    . NONFORMULARY OR COMPOUNDED ITEM Thigh high compression socks  20-30 mmhg    Re- low ext edema 1 each 1  . Vitamin D, Ergocalciferol, (DRISDOL) 1.25 MG (50000 UT) CAPS capsule Take 1 capsule (50,000 Units total) by mouth every 7 (seven) days. 4  capsule 0  . metFORMIN (GLUCOPHAGE) 500 MG tablet Take 1 tablet (500 mg total) by mouth daily with breakfast. 30 tablet 0   No current facility-administered medications on file prior to visit.      Objective:  Objective  Physical Exam Vitals signs and nursing note reviewed.  Constitutional:      Appearance: She is well-developed.  HENT:     Head: Normocephalic and atraumatic.  Eyes:     Conjunctiva/sclera: Conjunctivae normal.  Neck:      Musculoskeletal: Normal range of motion and neck supple.     Thyroid: No thyromegaly.     Vascular: No carotid bruit or JVD.  Cardiovascular:     Rate and Rhythm: Normal rate and regular rhythm.     Heart sounds: Normal heart sounds. No murmur.  Pulmonary:     Effort: Pulmonary effort is normal. No respiratory distress.     Breath sounds: Normal breath sounds. No wheezing or rales.  Chest:     Chest wall: No tenderness.  Musculoskeletal:        General: Swelling present.     Right lower leg: Edema present.     Left lower leg: Edema present.  Neurological:     Mental Status: She is alert and oriented to person, place, and time.    BP (!) 144/70 (BP Location: Right Arm, Patient Position: Sitting, Cuff Size: Large)   Pulse 67   Temp (!) 97.5 F (36.4 C) (Temporal)   Resp 18   Ht 5\' 5"  (1.651 m)   Wt (!) 343 lb (155.6 kg)   SpO2 99%   BMI 57.08 kg/m  Wt Readings from Last 3 Encounters:  03/04/19 (!) 343 lb (155.6 kg)  12/15/18 (!) 320 lb (145.2 kg)  12/06/18 (!) 331 lb (150.1 kg)     Lab Results  Component Value Date   WBC 2.7 (L) 10/08/2018   HGB 12.6 10/08/2018   HCT 39.1 10/08/2018   PLT 254 10/08/2018   GLUCOSE 90 10/08/2018   CHOL 183 06/03/2018   TRIG 49 06/03/2018   HDL 68 06/03/2018   LDLDIRECT 129.1 10/17/2009   LDLCALC 105 (H) 06/03/2018   ALT 15 10/08/2018   AST 15 10/08/2018   NA 140 10/08/2018   K 3.8 10/08/2018   CL 105 10/08/2018   CREATININE 0.95 10/08/2018   BUN 14 10/08/2018   CO2 25 10/08/2018   TSH 1.850 06/03/2018   INR 1.1 (H) 10/16/2014   HGBA1C 5.9 (H) 10/08/2018   MICROALBUR 0.8 08/07/2017    US Abdomen Limited Ruq  Result Date: 10/12/2018 CLINICAL DATA:  Right upper quadrant abdominal pain EXAM: ULTRASOUND ABDOMEN LIMITED RIGHT UPPER QUADRANT COMPARISON:  CT and pelvis dated 01/27/2015 FINDINGS: Gallbladder: No gallstones or wall thickening visualized. No sonographic Murphy sign noted by sonographer. Common bile duct: Diameter: 0.6  cm Liver: No focal lesion identified. Within normal limits in parenchymal echogenicity. Portal vein is patent on color Doppler imaging with normal direction of blood flow towards the liver. IMPRESSION: No acute sonographic abnormality detected. No sonographic evidence for cholelithiasis or acute cholecystitis. Electronically Signed   By: Constance Holster M.D.   On: 10/12/2018 19:46     Assessment & Plan:  Plan  I have changed Albertha P. Berish's furosemide. I am also having her maintain her glucose blood, Multiple Vitamins-Minerals (MULTIVITAMIN ADULT PO), NONFORMULARY OR COMPOUNDED ITEM, fluticasone, methocarbamol, carvedilol, metFORMIN, Vitamin D (Ergocalciferol), amLODipine, loratadine, and pantoprazole.  Meds ordered this encounter  Medications  . loratadine (CLARITIN)  10 MG tablet    Sig: Take 1 tablet (10 mg total) by mouth daily.    Dispense:  90 tablet    Refill:  3  . pantoprazole (PROTONIX) 40 MG tablet    Sig: Take 2 tablets (80 mg total) by mouth daily.    Dispense:  180 tablet    Refill:  3  . furosemide (LASIX) 40 MG tablet    Sig: Take 1 tablet (40 mg total) by mouth daily.    Dispense:  90 tablet    Refill:  3    Problem List Items Addressed This Visit      Unprioritized   Edema due to malnutrition ([place])   Relevant Medications   furosemide (LASIX) 40 MG tablet   Lymphedema    Refer to pt  Hopefully this will help get the swelling down       Relevant Orders   Ambulatory referral to Physical Therapy    Other Visit Diagnoses    Need for influenza vaccination    -  Primary   Relevant Orders   Flu Vaccine QUAD 6+ mos PF IM (Fluarix Quad PF) (Completed)   Chronic seasonal allergic rhinitis due to pollen       Relevant Medications   loratadine (CLARITIN) 10 MG tablet   Gastroesophageal reflux disease       Relevant Medications   pantoprazole (PROTONIX) 40 MG tablet      Follow-up: Return if symptoms worsen or fail to improve---as scheduled, for  hypertension, hyperlipidemia, diabetes II.  [name], DO

## 2019-03-04 NOTE — Assessment & Plan Note (Signed)
Refer to pt  Hopefully this will help get the swelling down

## 2019-03-04 NOTE — Patient Instructions (Signed)

## 2019-03-10 ENCOUNTER — Encounter: Payer: Self-pay | Admitting: Family Medicine

## 2019-04-06 ENCOUNTER — Other Ambulatory Visit: Payer: Self-pay

## 2019-04-07 ENCOUNTER — Ambulatory Visit (INDEPENDENT_AMBULATORY_CARE_PROVIDER_SITE_OTHER): Payer: 59 | Admitting: Family Medicine

## 2019-04-07 ENCOUNTER — Other Ambulatory Visit: Payer: Self-pay

## 2019-04-07 ENCOUNTER — Encounter: Payer: Self-pay | Admitting: Family Medicine

## 2019-04-07 VITALS — BP 128/84 | HR 62 | Temp 97.9°F | Resp 18 | Ht 65.0 in | Wt 348.0 lb

## 2019-04-07 DIAGNOSIS — E1169 Type 2 diabetes mellitus with other specified complication: Secondary | ICD-10-CM | POA: Insufficient documentation

## 2019-04-07 DIAGNOSIS — M546 Pain in thoracic spine: Secondary | ICD-10-CM | POA: Diagnosis not present

## 2019-04-07 DIAGNOSIS — I10 Essential (primary) hypertension: Secondary | ICD-10-CM

## 2019-04-07 DIAGNOSIS — J301 Allergic rhinitis due to pollen: Secondary | ICD-10-CM | POA: Insufficient documentation

## 2019-04-07 DIAGNOSIS — J324 Chronic pansinusitis: Secondary | ICD-10-CM | POA: Diagnosis not present

## 2019-04-07 DIAGNOSIS — E1165 Type 2 diabetes mellitus with hyperglycemia: Secondary | ICD-10-CM | POA: Diagnosis not present

## 2019-04-07 DIAGNOSIS — M542 Cervicalgia: Secondary | ICD-10-CM

## 2019-04-07 DIAGNOSIS — E785 Hyperlipidemia, unspecified: Secondary | ICD-10-CM

## 2019-04-07 DIAGNOSIS — E1151 Type 2 diabetes mellitus with diabetic peripheral angiopathy without gangrene: Secondary | ICD-10-CM

## 2019-04-07 DIAGNOSIS — I890 Lymphedema, not elsewhere classified: Secondary | ICD-10-CM

## 2019-04-07 MED ORDER — METHOCARBAMOL 500 MG PO TABS: 500.0000 mg | ORAL_TABLET | Freq: Four times a day (QID) | ORAL | 0 refills | Status: DC | PRN

## 2019-04-07 MED ORDER — LORATADINE 10 MG PO TABS: 10.0000 mg | ORAL_TABLET | Freq: Every day | ORAL | 3 refills | Status: DC

## 2019-04-07 MED ORDER — FLUTICASONE PROPIONATE 50 MCG/ACT NA SUSP: 2.0000 | Freq: Every day | NASAL | 6 refills | Status: DC

## 2019-04-07 NOTE — Assessment & Plan Note (Signed)
X ray c spine  Muscle relaxer  Consider pt / sport med if no relief

## 2019-04-07 NOTE — Patient Instructions (Signed)

## 2019-04-07 NOTE — Assessment & Plan Note (Signed)
Pt just started with pt and will get velcros wraps for her legs  And she will be in the pool too

## 2019-04-07 NOTE — Assessment & Plan Note (Signed)
Check labs today hgba1c to be checked , minimize simple carbs. Increase exercise as tolerated. Continue current meds  

## 2019-04-07 NOTE — Progress Notes (Signed)
------Patient ID: Tammy [name], female    [name]: 08/28/1960  Age: 58 y.o. MRN: [id number]    Subjective:  Subjective  HPI SHANTE BLOUGH presents for c/o neck pain on the L that sometimes radiates down her Left arm.  It is worse when she carries her pocketbook on that side and with stress.    She also need f/u dm, chol and bp.  No other complaints  HPI HYPERTENSION   Blood pressure range-not checking   Chest pain- no      Dyspnea- no Lightheadedness- no   Edema-yes  Other side effects - no   Medication compliance: good Low salt diet- yes    DIABETES    Blood Sugar ranges-good per pt  Polyuria- no New Visual problems- no  Hypoglycemic symptoms- no  Other side effects-no Medication compliance - good Last eye exam- due Foot exam- pt states no problems    HYPERLIPIDEMIA  Medication compliance- no RUQ pain- no  Muscle aches- no Other side effects-no       Review of Systems  Constitutional: Negative for appetite change, diaphoresis, fatigue and unexpected weight change.  Eyes: Negative for pain, redness and visual disturbance.  Respiratory: Negative for cough, chest tightness, shortness of breath and wheezing.   Cardiovascular: Negative for chest pain, palpitations and leg swelling.  Endocrine: Negative for cold intolerance, heat intolerance, polydipsia, polyphagia and polyuria.  Genitourinary: Negative for difficulty urinating, dysuria and frequency.  Musculoskeletal: Positive for neck pain and neck stiffness. Negative for back pain and gait problem.  Neurological: Negative for dizziness, light-headedness, numbness and headaches.    History Past Medical History:  Diagnosis Date  . Acute on chronic appendicitis s/p lap appy 06/10/2012 06/10/2012   surgery done 06-10-12  . Arthritis    ankle  . Back pain   . Chest pain   . Complication of anesthesia   . Constipation   . Diabetes mellitus    TYPE 2- no meds in several months-was taken off meds -monitoring blood  sugar.  Marland Kitchen GERD (gastroesophageal reflux disease)   . History of colon polyps   . Hyperlipidemia   . Hypertension   . Joint pain   . Lactose intolerance   . Morbid obesity - BMI > 50 12/13/2012  . Pneumonia    none recent  . PONV (postoperative nausea and vomiting)   . Poor venous access    "usually difficult stick"  . Prediabetes   . Sinusitis   . SOB (shortness of breath)   . Swelling   . Vitamin D deficiency     She has a past surgical history that includes Abdominal hysterectomy; Knee surgery (Right); Upper gastrointestinal endoscopy (04/14/2006); Colonoscopy (04/11/11); laparoscopic appendectomy (06/10/2012); Appendectomy; Gastric Roux-En-Y (N/A, 02/27/2015); and Colonoscopy with propofol (N/A, 05/11/2017).   Her family history includes Breast cancer in her paternal aunt and paternal aunt; Cancer (age of onset: 17) in her mother; Diabetes in her father and paternal grandmother; Heart disease in her father; Heart failure in her father; Hyperlipidemia in her brother and mother; Hypertension in her mother.She reports that she has never smoked. She has never used smokeless tobacco. She reports that she does not drink alcohol or use drugs.  Current Outpatient Medications on File Prior to Visit  Medication Sig Dispense Refill  . amLODipine (NORVASC) 2.5 MG tablet Take 1 tablet (2.5 mg total) by mouth 2 (two) times daily. 60 tablet 0  . carvedilol (COREG) 12.5 MG tablet TAKE 1 TABLET BY MOUTH  TWICE DAILY WITH  MEALS 180 tablet 3  . furosemide (LASIX) 40 MG tablet Take 1 tablet (40 mg total) by mouth daily. 90 tablet 3  . glucose blood test strip Check blood sugar twice daily 100 each 11  . Multiple Vitamins-Minerals (MULTIVITAMIN ADULT PO) Take 1 tablet daily by mouth. flintstone vitamins    . NONFORMULARY OR COMPOUNDED ITEM Thigh high compression socks  20-30 mmhg    Re- low ext edema 1 each 1  . pantoprazole (PROTONIX) 40 MG tablet Take 2 tablets (80 mg total) by mouth daily. 180 tablet 3   . metFORMIN (GLUCOPHAGE) 500 MG tablet Take 1 tablet (500 mg total) by mouth daily with breakfast. 30 tablet 0  . Vitamin D, Ergocalciferol, (DRISDOL) 1.25 MG (50000 UT) CAPS capsule Take 1 capsule (50,000 Units total) by mouth every 7 (seven) days. (Patient not taking: Reported on 04/07/2019) 4 capsule 0   No current facility-administered medications on file prior to visit.      Objective:  Objective  Physical Exam Vitals signs and nursing note reviewed.  Constitutional:      Appearance: She is well-developed.  HENT:     Head: Normocephalic and atraumatic.  Eyes:     Conjunctiva/sclera: Conjunctivae normal.  Neck:     Musculoskeletal: Normal range of motion and neck supple.     Thyroid: No thyromegaly.     Vascular: No carotid bruit or JVD.  Cardiovascular:     Rate and Rhythm: Normal rate and regular rhythm.     Heart sounds: Normal heart sounds. No murmur.  Pulmonary:     Effort: Pulmonary effort is normal. No respiratory distress.     Breath sounds: Normal breath sounds. No wheezing or rales.  Chest:     Chest wall: No tenderness.  Musculoskeletal:        General: Tenderness present.     Left shoulder: She exhibits pain and spasm. She exhibits normal range of motion.  Neurological:     Mental Status: She is alert and oriented to person, place, and time.    BP 128/84 (BP Location: Left Arm, Patient Position: Sitting, Cuff Size: Large)   Pulse 62   Temp 97.9 F (36.6 C) (Temporal)   Resp 18   Ht 5\' 5"  (1.651 m)   Wt (!) 348 lb (157.9 kg)   SpO2 98%   BMI 57.91 kg/m  Wt Readings from Last 3 Encounters:  04/07/19 (!) 348 lb (157.9 kg)  03/04/19 (!) 343 lb (155.6 kg)  12/15/18 (!) 320 lb (145.2 kg)     Lab Results  Component Value Date   WBC 2.7 (L) 10/08/2018   HGB 12.6 10/08/2018   HCT 39.1 10/08/2018   PLT 254 10/08/2018   GLUCOSE 90 10/08/2018   CHOL 183 06/03/2018   TRIG 49 06/03/2018   HDL 68 06/03/2018   LDLDIRECT 129.1 10/17/2009   LDLCALC 105 (H)  06/03/2018   ALT 15 10/08/2018   AST 15 10/08/2018   NA 140 10/08/2018   K 3.8 10/08/2018   CL 105 10/08/2018   CREATININE 0.95 10/08/2018   BUN 14 10/08/2018   CO2 25 10/08/2018   TSH 1.850 06/03/2018   INR 1.1 (H) 10/16/2014   HGBA1C 5.9 (H) 10/08/2018   MICROALBUR 0.8 08/07/2017    US Abdomen Limited Ruq  Result Date: 10/12/2018 CLINICAL DATA:  Right upper quadrant abdominal pain EXAM: ULTRASOUND ABDOMEN LIMITED RIGHT UPPER QUADRANT COMPARISON:  CT and pelvis dated 01/27/2015 FINDINGS: Gallbladder: No gallstones or wall thickening visualized. No sonographic  Murphy sign noted by sonographer. Common bile duct: Diameter: 0.6 cm Liver: No focal lesion identified. Within normal limits in parenchymal echogenicity. Portal vein is patent on color Doppler imaging with normal direction of blood flow towards the liver. IMPRESSION: No acute sonographic abnormality detected. No sonographic evidence for cholelithiasis or acute cholecystitis. Electronically Signed   By: Constance Holster M.D.   On: 10/12/2018 19:46     Assessment & Plan:  Plan  I am having [place] maintain her glucose blood, Multiple Vitamins-Minerals (MULTIVITAMIN ADULT PO), NONFORMULARY OR COMPOUNDED ITEM, carvedilol, metFORMIN, Vitamin D (Ergocalciferol), amLODipine, pantoprazole, furosemide, fluticasone, loratadine, and methocarbamol.  Meds ordered this encounter  Medications  . fluticasone (FLONASE) 50 MCG/ACT nasal spray    Sig: Place 2 sprays into both nostrils daily.    Dispense:  16 g    Refill:  6  . loratadine (CLARITIN) 10 MG tablet    Sig: Take 1 tablet (10 mg total) by mouth daily.    Dispense:  90 tablet    Refill:  3  . methocarbamol (ROBAXIN) 500 MG tablet    Sig: Take 1 tablet (500 mg total) by mouth every 6 (six) hours as needed for muscle spasms.    Dispense:  45 tablet    Refill:  0    Problem List Items Addressed This Visit      Unprioritized   Chronic seasonal allergic rhinitis due to  pollen   Relevant Medications   loratadine (CLARITIN) 10 MG tablet   DM (diabetes mellitus) type II, controlled, with peripheral vascular disorder ([place])    Check labs today hgba1c to be checked , minimize simple carbs. Increase exercise as tolerated. Continue current meds       Essential hypertension    Well controlled, no changes to meds. Encouraged heart healthy diet such as the DASH diet and exercise as tolerated.       Relevant Orders   Lipid panel   Hyperlipidemia associated with type 2 diabetes mellitus ([place])   Relevant Orders   Comprehensive metabolic panel   Lipid panel   Hyperlipidemia LDL goal <70    Tolerating statin, encouraged heart healthy diet, avoid trans fats, minimize simple carbs and saturated fats. Increase exercise as tolerated      Lymphedema    Pt just started with pt and will get velcros wraps for her legs  And she will be in the pool too      Morbid obesity - BMI > 50    Discuss diet and exercise She may go back tohealthy weight and wellness      Neck pain - Primary    X ray c spine  Muscle relaxer  Consider pt / sport med if no relief       Relevant Orders   DG Cervical Spine Complete   Uncontrolled type 2 diabetes mellitus with hyperglycemia (HCC)   Relevant Orders   Microalbumin / creatinine urine ratio   Comprehensive metabolic panel   Hemoglobin A1c    Other Visit Diagnoses    Pansinusitis, unspecified chronicity       Relevant Medications   fluticasone (FLONASE) 50 MCG/ACT nasal spray   loratadine (CLARITIN) 10 MG tablet   Acute right-sided thoracic back pain       Relevant Medications   methocarbamol (ROBAXIN) 500 MG tablet      Follow-up: Return if symptoms worsen or fail to improve.  [name], DO

## 2019-04-07 NOTE — Assessment & Plan Note (Signed)
Tolerating statin, encouraged heart healthy diet, avoid trans fats, minimize simple carbs and saturated fats. Increase exercise as tolerated 

## 2019-04-07 NOTE — Assessment & Plan Note (Signed)
Discuss diet and exercise She may go back tohealthy weight and wellness

## 2019-04-07 NOTE — Assessment & Plan Note (Signed)
Well controlled, no changes to meds. Encouraged heart healthy diet such as the DASH diet and exercise as tolerated.  °

## 2019-04-08 LAB — MICROALBUMIN / CREATININE URINE RATIO
Creatinine,U: 195 mg/dL
Microalb Creat Ratio: 0.4 mg/g (ref 0.0–30.0)
Microalb, Ur: <0.7 mg/dL (ref 0.0–1.9)

## 2019-04-12 ENCOUNTER — Other Ambulatory Visit (INDEPENDENT_AMBULATORY_CARE_PROVIDER_SITE_OTHER): Payer: 59

## 2019-04-12 ENCOUNTER — Other Ambulatory Visit: Payer: Self-pay | Admitting: Family Medicine

## 2019-04-12 ENCOUNTER — Ambulatory Visit (HOSPITAL_BASED_OUTPATIENT_CLINIC_OR_DEPARTMENT_OTHER)
Admission: RE | Admit: 2019-04-12 | Discharge: 2019-04-12 | Disposition: A | Discharge status: Home / Self Care | Payer: 59 | Source: Ambulatory Visit | Attending: Family Medicine | Admitting: Family Medicine

## 2019-04-12 ENCOUNTER — Other Ambulatory Visit: Payer: Self-pay

## 2019-04-12 DIAGNOSIS — M5030 Other cervical disc degeneration, unspecified cervical region: Secondary | ICD-10-CM

## 2019-04-12 DIAGNOSIS — E785 Hyperlipidemia, unspecified: Secondary | ICD-10-CM

## 2019-04-12 DIAGNOSIS — E1165 Type 2 diabetes mellitus with hyperglycemia: Secondary | ICD-10-CM

## 2019-04-12 DIAGNOSIS — M542 Cervicalgia: Secondary | ICD-10-CM

## 2019-04-12 DIAGNOSIS — E1169 Type 2 diabetes mellitus with other specified complication: Secondary | ICD-10-CM | POA: Diagnosis not present

## 2019-04-12 DIAGNOSIS — I10 Essential (primary) hypertension: Secondary | ICD-10-CM | POA: Diagnosis not present

## 2019-04-12 LAB — LIPID PANEL
Cholesterol: 217 mg/dL — ABNORMAL HIGH (ref 0–200)
HDL: 73.6 mg/dL (ref >39.00)
LDL Cholesterol: 133 mg/dL — ABNORMAL HIGH (ref 0–99)
NonHDL: 143.18
Total CHOL/HDL Ratio: 3
Triglycerides: 52 mg/dL (ref 0.0–149.0)
VLDL: 10.4 mg/dL (ref 0.0–40.0)

## 2019-04-12 LAB — COMPREHENSIVE METABOLIC PANEL
ALT: 14 U/L (ref 0–35)
AST: 15 U/L (ref 0–37)
Albumin: 4 g/dL (ref 3.5–5.2)
Alkaline Phosphatase: 89 U/L (ref 39–117)
BUN: 13 mg/dL (ref 6–23)
CO2: 29 mEq/L (ref 19–32)
Calcium: 8.4 mg/dL (ref 8.4–10.5)
Chloride: 102 mEq/L (ref 96–112)
Creatinine, Ser: 0.92 mg/dL (ref 0.40–1.20)
GFR: 75.82 mL/min (ref >60.00)
Glucose, Bld: 96 mg/dL (ref 70–99)
Potassium: 3.8 mEq/L (ref 3.5–5.1)
Sodium: 138 mEq/L (ref 135–145)
Total Bilirubin: 0.6 mg/dL (ref 0.2–1.2)
Total Protein: 7 g/dL (ref 6.0–8.3)

## 2019-04-12 LAB — HEMOGLOBIN A1C: Hgb A1c MFr Bld: 6 % (ref 4.6–6.5)

## 2019-04-13 ENCOUNTER — Other Ambulatory Visit: Payer: Self-pay | Admitting: Family Medicine

## 2019-04-13 DIAGNOSIS — E1165 Type 2 diabetes mellitus with hyperglycemia: Secondary | ICD-10-CM

## 2019-04-13 DIAGNOSIS — E785 Hyperlipidemia, unspecified: Secondary | ICD-10-CM

## 2019-04-13 DIAGNOSIS — E1169 Type 2 diabetes mellitus with other specified complication: Secondary | ICD-10-CM

## 2019-04-21 ENCOUNTER — Encounter (INDEPENDENT_AMBULATORY_CARE_PROVIDER_SITE_OTHER): Payer: Self-pay | Admitting: Family Medicine

## 2019-04-21 ENCOUNTER — Ambulatory Visit (INDEPENDENT_AMBULATORY_CARE_PROVIDER_SITE_OTHER): Payer: 59 | Admitting: Family Medicine

## 2019-04-21 ENCOUNTER — Other Ambulatory Visit: Payer: Self-pay

## 2019-04-21 VITALS — BP 117/76 | HR 52 | Temp 98.0°F | Ht 65.0 in | Wt 344.0 lb

## 2019-04-21 DIAGNOSIS — E7849 Other hyperlipidemia: Secondary | ICD-10-CM | POA: Diagnosis not present

## 2019-04-21 DIAGNOSIS — E1151 Type 2 diabetes mellitus with diabetic peripheral angiopathy without gangrene: Secondary | ICD-10-CM | POA: Diagnosis not present

## 2019-04-21 DIAGNOSIS — E559 Vitamin D deficiency, unspecified: Secondary | ICD-10-CM

## 2019-04-21 DIAGNOSIS — Z9189 Other specified personal risk factors, not elsewhere classified: Secondary | ICD-10-CM

## 2019-04-21 DIAGNOSIS — K219 Gastro-esophageal reflux disease without esophagitis: Secondary | ICD-10-CM

## 2019-04-21 DIAGNOSIS — I10 Essential (primary) hypertension: Secondary | ICD-10-CM

## 2019-04-21 DIAGNOSIS — Z6841 Body Mass Index (BMI) 40.0 and over, adult: Secondary | ICD-10-CM

## 2019-04-21 MED ORDER — AMLODIPINE BESYLATE 2.5 MG PO TABS: 2.5000 mg | ORAL_TABLET | Freq: Two times a day (BID) | ORAL | 0 refills | Status: DC

## 2019-04-21 MED ORDER — METFORMIN HCL 500 MG PO TABS: 500.0000 mg | ORAL_TABLET | Freq: Every day | ORAL | 0 refills | Status: DC

## 2019-04-21 MED ORDER — VITAMIN D (ERGOCALCIFEROL) 1.25 MG (50000 UNIT) PO CAPS: 50000.0000 [IU] | ORAL_CAPSULE | ORAL | 0 refills | Status: DC

## 2019-04-26 ENCOUNTER — Other Ambulatory Visit: Payer: Self-pay | Admitting: Family Medicine

## 2019-04-26 DIAGNOSIS — Z1231 Encounter for screening mammogram for malignant neoplasm of breast: Secondary | ICD-10-CM

## 2019-04-26 NOTE — Progress Notes (Signed)
Office: [phone]  /  Fax: [phone]   HPI:  Chief Complaint: OBESITY Tammy [name] is here to discuss her progress with her obesity treatment plan. She is on the Category 3 plan and states she is following her eating plan approximately 0 % of the time. She states she is walking 6,700 steps 5 times per week.  Tammy [name] has not been seen for several months because she wanted to focus on joint pain management first. She is seeing an Orthopedist. She is status post cortisone steroids in feet and synvisc in knees. She notes her pain has somewhat improved. She is ready to get restarted on her meal plan. She needs information for the diet plan again.   Today's visit was # 13  Starting weight: 334 lbs Starting date: 05/25/2018 Today's weight : 344 lbs Today's date: 04/21/2019 Total lbs lost to date: 0 Total lbs lost since last in-office visit: 0  Vitamin D Deficiency Tammy [name] has a diagnosis of vitamin D deficiency. She is currently taking prescription Vit D.  At risk for osteopenia and osteoporosis Tammy [name] is at higher risk of osteopenia and osteoporosis due to vitamin D deficiency.   Hypertension Tammy [name] has a diagnosis of hypertension. Her blood pressure is 117/76 today and is at goal. She is taking amlodipine, Coreg, and Lasix.   Diabetes II Tammy [name] has a diagnosis of diabetes type II. Her last A1c was 6.0. She is tolerating metformin and notes it helps with hyperphagia.   Hyperlipidemia Tammy [name] has a diagnosis of hyperlipidemia. Her last LDL was 133.  The 10-year ASCVD risk score [name] DC [name]., et al., 2013) is: 9.2%   Values used to calculate the score:     Age: 58 years     Sex: Female     Is Non-Hispanic African American: Yes     Diabetic: Yes     Tobacco smoker: No     Systolic Blood Pressure: 123XX123 mmHg     Is BP treated: Yes     HDL Cholesterol: 73.6 mg/dL     Total Cholesterol: 217 mg/dL  GERD Tammy [name] has a diagnosis of GERD. She is taking Protonix.  ASSESSMENT AND  PLAN:  Vitamin D deficiency - Plan: Vitamin D, Ergocalciferol, (DRISDOL) 1.25 MG (50000 UT) CAPS capsule  Essential hypertension - Plan: amLODipine (NORVASC) 2.5 MG tablet  DM (diabetes mellitus) type II, controlled, with peripheral vascular disorder (HCC) - Plan: metFORMIN (GLUCOPHAGE) 500 MG tablet  Other hyperlipidemia  Gastroesophageal reflux disease, unspecified whether esophagitis present  At risk for osteoporosis  Class 3 severe obesity with serious comorbidity and body mass index (BMI) of 50.0 to 59.9 in adult, unspecified obesity type ([place])  PLAN:  Vitamin D Deficiency Low vitamin D level contributes to fatigue and are associated with obesity, breast, and colon cancer. Tammy [name] agrees to continue taking prescription Vit D 50,000 IU every week #4 and we will refill for 1 month. She will follow up for routine testing of vitamin D, at least 2-3 times per year to avoid over-replacement. We will continue to monitor.  At risk for osteopenia and osteoporosis Tammy [name] was given extended (15 minutes) osteoporosis prevention counseling today. Tammy [name] is at risk for osteopenia and osteoporsis due to her vitamin D deficiency. She was encouraged to take her vitamin D and follow her higher calcium diet and increase strengthening exercise to help strengthen her bones and decrease her risk of osteopenia and osteoporosis.  Hypertension Tammy [name] is working on healthy weight loss and exercise to improve blood pressure control. We  will watch for signs of hypotension as she continues her lifestyle modifications. Tammy [name] agrees to continue taking amlodipine 2.5 mg PO BID #60 and we will refill for 1 month. We will continue to monitor.  Diabetes II Tammy [name] has been given diabetes education by myself today. Good blood sugar control is important to decrease the likelihood of diabetic complications such as nephropathy, neuropathy, limb loss, blindness, coronary artery disease, and death. Intensive lifestyle  modification including diet, exercise and weight loss were discussed as the first line treatment for diabetes. Tammy [name] agrees to continue taking metformin 500 mg PO daily #30 and we will refill for 1 month. We will continue to monitor.  Hyperlipidemia Intensive lifestyle modifications as the first line treatment for hyperlipidemia. We discussed many lifestyle modifications today and Tammy [name] will continue to work on diet, exercise and weight loss efforts. We will continue to monitor.  GERD Tammy [name] will continue her current treatment, and we will continue to monitor.  Obesity Tammy [name] is currently in the action stage of change. As such, her goal is to continue with weight loss efforts. She has agreed to follow the Category 3 plan. Tammy [name] has been instructed to work up to a goal of 150 minutes of combined cardio and strengthening exercise per week for weight loss and overall health benefits. We discussed the following Behavioral Modification Strategies today: increasing lean protein intake, decreasing simple carbohydrates, increase H20 intake, work on meal planning and easy cooking plans, keeping healthy foods in the home, and celebration eating strategies.  Tammy [name] has agreed to follow up with our clinic in 3 weeks. She was informed of the importance of frequent follow up visits to maximize her success with intensive lifestyle modifications for her multiple health conditions.  ALLERGIES: Allergies  Allergen Reactions  . Ace Inhibitors Anaphylaxis    Bowel angioedema  . Cheese Nausea And Vomiting  . Red Dye Nausea And Vomiting  . Tammy [name] [Fish Allergy] Nausea And Vomiting    MEDICATIONS: Current Outpatient Medications on File Prior to Visit  Medication Sig Dispense Refill  . carvedilol (COREG) 12.5 MG tablet TAKE 1 TABLET BY MOUTH  TWICE DAILY WITH MEALS 180 tablet 3  . fluticasone (FLONASE) 50 MCG/ACT nasal spray Place 2 sprays into both nostrils daily. 16 g 6  . furosemide (LASIX) 40 MG tablet  Take 1 tablet (40 mg total) by mouth daily. 90 tablet 3  . glucose blood test strip Check blood sugar twice daily 100 each 11  . loratadine (CLARITIN) 10 MG tablet Take 1 tablet (10 mg total) by mouth daily. 90 tablet 3  . methocarbamol (ROBAXIN) 500 MG tablet Take 1 tablet (500 mg total) by mouth every 6 (six) hours as needed for muscle spasms. 45 tablet 0  . Multiple Vitamins-Minerals (MULTIVITAMIN ADULT PO) Take 1 tablet daily by mouth. flintstone vitamins    . NONFORMULARY OR COMPOUNDED ITEM Thigh high compression socks  20-30 mmhg    Re- low ext edema 1 each 1  . pantoprazole (PROTONIX) 40 MG tablet Take 2 tablets (80 mg total) by mouth daily. 180 tablet 3   No current facility-administered medications on file prior to visit.    PAST MEDICAL HISTORY: Past Medical History:  Diagnosis Date  . Acute on chronic appendicitis s/p lap appy 06/10/2012 06/10/2012   surgery done 06-10-12  . Arthritis    ankle  . Back pain   . Chest pain   . Complication of anesthesia   . Constipation   . Diabetes mellitus  TYPE 2- no meds in several months-was taken off meds -monitoring blood sugar.  Marland Kitchen GERD (gastroesophageal reflux disease)   . History of colon polyps   . Hyperlipidemia   . Hypertension   . Joint pain   . Lactose intolerance   . Morbid obesity - BMI > 50 12/13/2012  . Pneumonia    none recent  . PONV (postoperative nausea and vomiting)   . Poor venous access    "usually difficult stick"  . Prediabetes   . Sinusitis   . SOB (shortness of breath)   . Swelling   . Vitamin D deficiency     PAST SURGICAL HISTORY: Past Surgical History:  Procedure Laterality Date  . ABDOMINAL HYSTERECTOMY    . APPENDECTOMY     2'14  . COLONOSCOPY  04/11/11   5 mm polyp removed but not recovered  . COLONOSCOPY WITH PROPOFOL N/A 05/11/2017   Procedure: COLONOSCOPY WITH PROPOFOL;  Surgeon: Gatha Mayer, MD;  Location: [facility];  Service: Endoscopy;  Laterality: N/A;  . GASTRIC ROUX-EN-Y N/A  02/27/2015   Procedure: LAPAROSCOPIC ROUX-EN-Y GASTRIC BYPASS WITH UPPER ENDOSCOPY;  Surgeon: Johnathan Hausen, MD;  Location: [facility];  Service: General;  Laterality: N/A;  . KNEE SURGERY Right     KNEE SURGERY   . LAPAROSCOPIC APPENDECTOMY  06/10/2012   Procedure: APPENDECTOMY LAPAROSCOPIC;  Surgeon: Adin Hector, MD;  Location: [facility];  Service: General;  Laterality: N/A;  . UPPER GASTROINTESTINAL ENDOSCOPY  04/14/2006   Normal    SOCIAL HISTORY: Social History   Tobacco Use  . Smoking status: Never Smoker  . Smokeless tobacco: Never Used  Substance Use Topics  . Alcohol use: No    Alcohol/week: 0.0 standard drinks  . Drug use: No    FAMILY HISTORY: Family History  Problem Relation Age of Onset  . Hyperlipidemia Mother   . Hypertension Mother   . Cancer Mother 27       hodgkins lymphoma  . Diabetes Father   . Heart failure Father   . Heart disease Father   . Hyperlipidemia Brother   . Diabetes Paternal Grandmother   . Breast cancer Paternal Aunt        unsure of age  . Breast cancer Paternal Aunt   . Colon cancer Neg Hx   . Heart attack Neg Hx   . Sudden death Neg Hx   . Colon polyps Neg Hx   . Esophageal cancer Neg Hx   . Stomach cancer Neg Hx   . Rectal cancer Neg Hx     ROS: Review of Systems  Constitutional: Negative for weight loss.  Endo/Heme/Allergies:       Negative hyperphagia    PHYSICAL EXAM: Blood pressure 117/76, pulse (!) 52, temperature 98 F (36.7 C), temperature source Oral, height 5\' 5"  (1.651 m), weight (!) 344 lb (156 kg), SpO2 99 %. Body mass index is 57.24 kg/m.  General: Cooperative, alert, well developed, in no acute distress. HEENT: Conjunctivae and lids unremarkable. Neck: No thyromegaly.  Cardiovascular: Regular rhythm.  Lungs: Normal work of breathing. Extremities: No edema.  Neurologic: No focal deficits.   RECENT LABS AND TESTS: BMET    Component Value Date/Time   NA 138 04/12/2019 1327   NA 140 06/03/2018 1404    K 3.8 04/12/2019 1327   CL 102 04/12/2019 1327   CO2 29 04/12/2019 1327   GLUCOSE 96 04/12/2019 1327   BUN 13 04/12/2019 1327   BUN 12 06/03/2018 1404   CREATININE  0.92 04/12/2019 1327   CREATININE 0.95 10/08/2018 1431   CALCIUM 8.4 04/12/2019 1327   GFRNONAA 71 06/03/2018 1404   GFRAA 82 06/03/2018 1404   Lab Results  Component Value Date   HGBA1C 6.0 04/12/2019   HGBA1C 5.9 (H) 10/08/2018   HGBA1C 6.1 (H) 06/03/2018   HGBA1C 6.1 03/10/2018   HGBA1C 5.9 08/07/2017   Lab Results  Component Value Date   INSULIN 6.9 06/03/2018   CBC    Component Value Date/Time   WBC 2.7 (L) 10/08/2018 1431   RBC 4.44 10/08/2018 1431   HGB 12.6 10/08/2018 1431   HGB 12.9 06/03/2018 1404   HCT 39.1 10/08/2018 1431   HCT 37.9 06/03/2018 1404   PLT 254 10/08/2018 1431   MCV 88.1 10/08/2018 1431   MCV 85 06/03/2018 1404   MCH 28.4 10/08/2018 1431   MCHC 32.2 10/08/2018 1431   RDW 13.8 10/08/2018 1431   RDW 13.5 06/03/2018 1404   LYMPHSABS 937 10/08/2018 1431   LYMPHSABS 1.4 06/03/2018 1404   MONOABS 0.4 08/07/2017 0711   EOSABS 51 10/08/2018 1431   EOSABS 0.1 06/03/2018 1404   BASOSABS 11 10/08/2018 1431   BASOSABS 0.0 06/03/2018 1404   Iron/TIBC/Ferritin/ %Sat No results found for: IRON, TIBC, FERRITIN, IRONPCTSAT Lipid Panel     Component Value Date/Time   CHOL 217 (H) 04/12/2019 1327   CHOL 183 06/03/2018 1404   TRIG 52.0 04/12/2019 1327   HDL 73.60 04/12/2019 1327   HDL 68 06/03/2018 1404   CHOLHDL 3 04/12/2019 1327   VLDL 10.4 04/12/2019 1327   LDLCALC 133 (H) 04/12/2019 1327   LDLCALC 105 (H) 06/03/2018 1404   LDLDIRECT 129.1 10/17/2009 0825   Hepatic Function Panel     Component Value Date/Time   PROT 7.0 04/12/2019 1327   PROT 7.0 06/03/2018 1404   ALBUMIN 4.0 04/12/2019 1327   ALBUMIN 4.3 06/03/2018 1404   AST 15 04/12/2019 1327   ALT 14 04/12/2019 1327   ALKPHOS 89 04/12/2019 1327   BILITOT 0.6 04/12/2019 1327   BILITOT 0.5 06/03/2018 1404   BILIDIR 0.0  05/02/2014 1503      Component Value Date/Time   TSH 1.850 06/03/2018 1404   TSH 2.89 08/07/2017 0711   TSH 3.181 02/21/2014 1038    I, Trixie Dredge, am acting as Location manager for Briscoe Deutscher, DO  I have reviewed the above documentation for accuracy and completeness, and I agree with the above. Briscoe Deutscher, DO  Time spent: 50 minutes, of which >50% was spent in obtaining information about her symptoms, reviewing her previous labs, evaluations, and treatments, counseling her about her condition (please see the discussed topics above), and developing a plan of treatment; she had a number of questions which I addressed.

## 2019-04-27 ENCOUNTER — Encounter (INDEPENDENT_AMBULATORY_CARE_PROVIDER_SITE_OTHER): Payer: Self-pay | Admitting: Family Medicine

## 2019-05-03 ENCOUNTER — Telehealth: Payer: Self-pay

## 2019-05-03 NOTE — Telephone Encounter (Signed)
Copied from [place] [id number]. Topic: Appointment Scheduling - Scheduling Inquiry for Clinic >> May 03, 2019  1:27 PM Rainey Pines A wrote: Patient wants to know if she can reschedule her appt on 05/08/2018 for tomorrow. Please advise

## 2019-05-04 ENCOUNTER — Other Ambulatory Visit: Payer: Self-pay

## 2019-05-04 ENCOUNTER — Ambulatory Visit: Payer: Self-pay | Admitting: *Deleted

## 2019-05-04 ENCOUNTER — Ambulatory Visit (INDEPENDENT_AMBULATORY_CARE_PROVIDER_SITE_OTHER): Payer: 59 | Admitting: Medical

## 2019-05-04 ENCOUNTER — Encounter: Payer: Self-pay | Admitting: Medical

## 2019-05-04 VITALS — BP 117/76

## 2019-05-04 DIAGNOSIS — J3489 Other specified disorders of nose and nasal sinuses: Secondary | ICD-10-CM

## 2019-05-04 DIAGNOSIS — I10 Essential (primary) hypertension: Secondary | ICD-10-CM | POA: Diagnosis not present

## 2019-05-04 DIAGNOSIS — J309 Allergic rhinitis, unspecified: Secondary | ICD-10-CM | POA: Diagnosis not present

## 2019-05-04 LAB — HM DIABETES EYE EXAM

## 2019-05-04 MED ORDER — AZELASTINE HCL 0.1 % NA SOLN: 2.0000 | Freq: Two times a day (BID) | NASAL | 0 refills | Status: DC

## 2019-05-04 MED ORDER — AMOXICILLIN-POT CLAVULANATE 875-125 MG PO TABS: 1.0000 | ORAL_TABLET | Freq: Two times a day (BID) | ORAL | 0 refills | Status: DC

## 2019-05-04 NOTE — Patient Instructions (Signed)
You do have hx of allergies and sinus infection. Probable dx presently. Will rx augmentin antibiotic and advise continue flonase. Will rx astelin nasal spray to help with congestion.   For htn  recommend starting on low dose amlodipine as MD rx'd 2 weeks ago.  Advise staying at home over and update me on Monday on how you are doing. covid infection is consideration and if you don't improve with your current symptoms then would recommend getting tested on Monday. Pt expressed understanding. Option given to go ahead and test today but declined.  Follow on Monday or as needed

## 2019-05-04 NOTE — Progress Notes (Signed)
Subjective:    Patient ID: Tammy [name], female    [name]: February 25, 1961, 58 y.o.   MRN: [id number]  HPI  Virtual Visit via Telephone Note  I connected with Tammy [name] on 05/04/19 at  2:40 PM EST by telephone and verified that I am speaking with the correct person using two identifiers.  Location: Patient: home Provider: office   I discussed the limitations, risks, security and privacy concerns of performing an evaluation and management service by telephone and the availability of in person appointments. I also discussed with the patient that there may be a patient responsible charge related to this service. The patient expressed understanding and agreed to proceed.      Follow Up Instructions:    I discussed the assessment and treatment plan with the patient. The patient was provided an opportunity to ask questions and all were answered. The patient agreed with the plan and demonstrated an understanding of the instructions.   The patient was advised to call back or seek an in-person evaluation if the symptoms worsen or if the condition fails to improve as anticipated.  I provided 25 minutes of non-face-to-face time during this encounter.   Mackie Pai, PA-C   History of Present Illness: Pt has some sinus pain/pressure around her sinus region and bridge. Started this morning. Now feeling like she is getting a ha.  Recently in morning some nasal congestion that improves with flonase. Pt also is on claritin. This has been for about 10 days.  She has some hx of allergies  Pt bp recently today was 81 and 74 diastolic. Systolic was 99991111 second time. First time was 170. No decongestants.  Pt not having any gross motor or sensory function deficits.   Pt went to weight loss management her bp was 117/76. Pt has no picked up new rx amlodipine.     Observations/Objective: General- no acute distress, pleasant, alert and oriented. Pleasant pt. heent- when she presses on  sinus reports pressure/pain.  Assessment and Plan: You do have hx of allergies and sinus infection. Probable dx presently. Will rx augmentin antibiotic and advise continue flonase. Will rx astelin nasal spray to help with congestion.   For htn  recommend starting on low dose amlodipine as MD rx'd 2 weeks ago.  Advise staying at home over and update me on Monday on how you are doing. covid infection is consideration and if you don't improve with your current symptoms then would recommend getting tested on Monday. Pt expressed understanding. Option given to go ahead and test today but declined.  Follow on Monday or as needed  Mackie Pai, PA-C  Follow Up Instructions:    I discussed the assessment and treatment plan with the patient. The patient was provided an opportunity to ask questions and all were answered. The patient agreed with the plan and demonstrated an understanding of the instructions.   The patient was advised to call back or seek an in-person evaluation if the symptoms worsen or if the condition fails to improve as anticipated.  I provided 25  minutes of non-face-to-face time during this encounter.   Mackie Pai, PA-C    Review of Systems  Constitutional: Negative for chills, fatigue and fever.  HENT: Positive for congestion, sinus pressure and sinus pain.   Respiratory: Negative for chest tightness, shortness of breath and wheezing.   Cardiovascular: Negative for chest pain and palpitations.  Gastrointestinal: Negative for abdominal pain.  Musculoskeletal: Negative for back pain, myalgias and neck stiffness.  Skin: Negative for rash.  Neurological: Positive for headaches. Negative for dizziness, tremors, syncope, weakness and numbness.  Hematological: Negative for adenopathy. Does not bruise/bleed easily.    Past Medical History:  Diagnosis Date  . Acute on chronic appendicitis s/p lap appy 06/10/2012 06/10/2012   surgery done 06-10-12  . Arthritis    ankle    . Back pain   . Chest pain   . Complication of anesthesia   . Constipation   . Diabetes mellitus    TYPE 2- no meds in several months-was taken off meds -monitoring blood sugar.  Marland Kitchen GERD (gastroesophageal reflux disease)   . History of colon polyps   . Hyperlipidemia   . Hypertension   . Joint pain   . Lactose intolerance   . Morbid obesity - BMI > 50 12/13/2012  . Pneumonia    none recent  . PONV (postoperative nausea and vomiting)   . Poor venous access    "usually difficult stick"  . Prediabetes   . Sinusitis   . SOB (shortness of breath)   . Swelling   . Vitamin D deficiency      Social History   Socioeconomic History  . Marital status: Married    Spouse name: Dorothyann Peng  . Number of children: 0  . Years of education: Not on file  . Highest education level: Not on file  Occupational History  . 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[employer]  Tobacco Use  . Smoking status: Never Smoker  . Smokeless tobacco: Never Used  Substance and Sexual Activity  . Alcohol use: No    Alcohol/week: 0.0 standard drinks  . Drug use: No  . Sexual activity: Yes    Partners: Male  Other Topics Concern  . Not on file  Social History Narrative   Exercise- no   Social Determinants of Health   Financial Resource Strain:   . Difficulty of Paying Living Expenses: Not on file  Food Insecurity:   . Worried About Charity fundraiser in the Last Year: Not on file  . Ran Out of Food in the Last Year: Not on file  Transportation Needs:   . Lack of Transportation (Medical): Not on file  . Lack of Transportation (Non-Medical): Not on file  Physical Activity:   . Days of Exercise per Week: Not on file  . Minutes of Exercise per Session: Not on file  Stress:   . Feeling of Stress : Not on file  Social Connections:   . Frequency of Communication with Friends and Family: Not on file  . Frequency of Social Gatherings with Friends and Family: Not on file  . Attends Religious  Services: Not on file  . Active Member of Clubs or Organizations: Not on file  . Attends Archivist Meetings: Not on file  . Marital Status: Not on file  Intimate Partner Violence:   . Fear of Current or Ex-Partner: Not on file  . Emotionally Abused: Not on file  . Physically Abused: Not on file  . Sexually Abused: Not on file    Past Surgical History:  Procedure Laterality Date  . ABDOMINAL HYSTERECTOMY    . APPENDECTOMY     2'14  . COLONOSCOPY  04/11/11   5 mm polyp removed but not recovered  . COLONOSCOPY WITH PROPOFOL N/A 05/11/2017   Procedure: COLONOSCOPY WITH PROPOFOL;  Surgeon: Gatha Mayer, MD;  Location: [facility];  Service: Endoscopy;  Laterality: N/A;  . GASTRIC  ROUX-EN-Y N/A 02/27/2015   Procedure: LAPAROSCOPIC ROUX-EN-Y GASTRIC BYPASS WITH UPPER ENDOSCOPY;  Surgeon: Johnathan Hausen, MD;  Location: [facility];  Service: General;  Laterality: N/A;  . KNEE SURGERY Right     KNEE SURGERY   . LAPAROSCOPIC APPENDECTOMY  06/10/2012   Procedure: APPENDECTOMY LAPAROSCOPIC;  Surgeon: Adin Hector, MD;  Location: [facility];  Service: General;  Laterality: N/A;  . UPPER GASTROINTESTINAL ENDOSCOPY  04/14/2006   Normal    Family History  Problem Relation Age of Onset  . Hyperlipidemia Mother   . Hypertension Mother   . Cancer Mother 42       hodgkins lymphoma  . Diabetes Father   . Heart failure Father   . Heart disease Father   . Hyperlipidemia Brother   . Diabetes Paternal Grandmother   . Breast cancer Paternal Aunt        unsure of age  . Breast cancer Paternal Aunt   . Colon cancer Neg Hx   . Heart attack Neg Hx   . Sudden death Neg Hx   . Colon polyps Neg Hx   . Esophageal cancer Neg Hx   . Stomach cancer Neg Hx   . Rectal cancer Neg Hx     Allergies  Allergen Reactions  . Ace Inhibitors Anaphylaxis    Bowel angioedema  . Cheese Nausea And Vomiting  . Red Dye Nausea And Vomiting  . [name] Flash [Fish Allergy] Nausea And Vomiting    Current Outpatient  Medications on File Prior to Visit  Medication Sig Dispense Refill  . amLODipine (NORVASC) 2.5 MG tablet Take 1 tablet (2.5 mg total) by mouth 2 (two) times daily. 60 tablet 0  . carvedilol (COREG) 12.5 MG tablet TAKE 1 TABLET BY MOUTH  TWICE DAILY WITH MEALS 180 tablet 3  . fluticasone (FLONASE) 50 MCG/ACT nasal spray Place 2 sprays into both nostrils daily. 16 g 6  . furosemide (LASIX) 40 MG tablet Take 1 tablet (40 mg total) by mouth daily. 90 tablet 3  . glucose blood test strip Check blood sugar twice daily 100 each 11  . loratadine (CLARITIN) 10 MG tablet Take 1 tablet (10 mg total) by mouth daily. 90 tablet 3  . metFORMIN (GLUCOPHAGE) 500 MG tablet Take 1 tablet (500 mg total) by mouth daily with breakfast. 30 tablet 0  . methocarbamol (ROBAXIN) 500 MG tablet Take 1 tablet (500 mg total) by mouth every 6 (six) hours as needed for muscle spasms. 45 tablet 0  . Multiple Vitamins-Minerals (MULTIVITAMIN ADULT PO) Take 1 tablet daily by mouth. flintstone vitamins    . NONFORMULARY OR COMPOUNDED ITEM Thigh high compression socks  20-30 mmhg    Re- low ext edema 1 each 1  . pantoprazole (PROTONIX) 40 MG tablet Take 2 tablets (80 mg total) by mouth daily. 180 tablet 3  . Vitamin D, Ergocalciferol, (DRISDOL) 1.25 MG (50000 UT) CAPS capsule Take 1 capsule (50,000 Units total) by mouth every 7 (seven) days. 4 capsule 0   No current facility-administered medications on file prior to visit.    BP 117/76       Objective:   Physical Exam        Assessment & Plan:

## 2019-05-04 NOTE — Telephone Encounter (Signed)
Patient is calling to report she has sinus symptoms that are causing her to have increased BP. Call to office for appointment  Reason for Disposition . [1] Sinus congestion (pressure, fullness) AND [2] present > 10 days . Systolic BP  >= 0000000 OR Diastolic >= 123XX123  Answer Assessment - Initial Assessment Questions 1. BLOOD PRESSURE: "What is the blood pressure?" "Did you take at least two measurements 5 minutes apart?"     176/89, 155/81 P 66 2. ONSET: "When did you take your blood pressure?"     12:00 3. HOW: "How did you obtain the blood pressure?" (e.g., visiting nurse, automatic home BP monitor)     Automatic cuff on arm 4. HISTORY: "Do you have a history of high blood pressure?"     yes 5. MEDICATIONS: "Are you taking any medications for blood pressure?" "Have you missed any doses recently?"     Yes- no missed doses 6. OTHER SYMPTOMS: "Do you have any symptoms?" (e.g., headache, chest pain, blurred vision, difficulty breathing, weakness)     Pressure in head- Flonase and Claritin helps, eye pressure 7. PREGNANCY: "Is there any chance you are pregnant?" "When was your last menstrual period?"     n/a  Answer Assessment - Initial Assessment Questions 1. LOCATION: "Where does it hurt?"      Pressure in head and forehead 2. ONSET: "When did the sinus pain start?"  (e.g., hours, days)      1 1/2 weeks 3. SEVERITY: "How bad is the pain?"   (Scale 1-10; mild, moderate or severe)   - MILD (1-3): doesn't interfere with normal activities    - MODERATE (4-7): interferes with normal activities (e.g., work or school) or awakens from sleep   - SEVERE (8-10): excruciating pain and patient unable to do any normal activities        Mild/moderate 4. RECURRENT SYMPTOM: "Have you ever had sinus problems before?" If so, ask: "When was the last time?" and "What happened that time?"      Patient has allergies, past sinus infection 5. NASAL CONGESTION: "Is the nose blocked?" If so, ask, "Can you open it  or must you breathe through the mouth?"     One side stopped up 6. NASAL DISCHARGE: "Do you have discharge from your nose?" If so ask, "What color?"     Cream colored 7. FEVER: "Do you have a fever?" If so, ask: "What is it, how was it measured, and when did it start?"      No fever 8. OTHER SYMPTOMS: "Do you have any other symptoms?" (e.g., sore throat, cough, earache, difficulty breathing)     Cough, pain in sinuses at cheek and nose 9. PREGNANCY: "Is there any chance you are pregnant?" "When was your last menstrual period?"     n/a  Protocols used: SINUS PAIN OR CONGESTION-A-AH, HIGH BLOOD PRESSURE-A-AH

## 2019-05-04 NOTE — Telephone Encounter (Signed)
Pt had appointment with [facility] today

## 2019-05-09 ENCOUNTER — Ambulatory Visit: Payer: 59 | Admitting: Family Medicine

## 2019-05-19 ENCOUNTER — Encounter (INDEPENDENT_AMBULATORY_CARE_PROVIDER_SITE_OTHER): Payer: Self-pay | Admitting: Family Medicine

## 2019-05-19 ENCOUNTER — Other Ambulatory Visit: Payer: Self-pay

## 2019-05-19 ENCOUNTER — Ambulatory Visit (INDEPENDENT_AMBULATORY_CARE_PROVIDER_SITE_OTHER): Payer: 59 | Admitting: Family Medicine

## 2019-05-19 VITALS — BP 136/80 | HR 52 | Temp 98.2°F | Ht 65.0 in | Wt 334.0 lb

## 2019-05-19 DIAGNOSIS — Z6841 Body Mass Index (BMI) 40.0 and over, adult: Secondary | ICD-10-CM

## 2019-05-19 DIAGNOSIS — E119 Type 2 diabetes mellitus without complications: Secondary | ICD-10-CM

## 2019-05-19 DIAGNOSIS — Z9189 Other specified personal risk factors, not elsewhere classified: Secondary | ICD-10-CM | POA: Diagnosis not present

## 2019-05-19 DIAGNOSIS — M170 Bilateral primary osteoarthritis of knee: Secondary | ICD-10-CM

## 2019-05-19 DIAGNOSIS — E559 Vitamin D deficiency, unspecified: Secondary | ICD-10-CM | POA: Diagnosis not present

## 2019-05-19 DIAGNOSIS — I10 Essential (primary) hypertension: Secondary | ICD-10-CM | POA: Diagnosis not present

## 2019-05-19 DIAGNOSIS — E7849 Other hyperlipidemia: Secondary | ICD-10-CM

## 2019-05-19 NOTE — Progress Notes (Signed)
Chief Complaint:   OBESITY Tammy [name] [name] is here to discuss her progress with her obesity treatment plan along with follow-up of her obesity related diagnoses. Tammy [name] [name] is on the Category 3 Plan and states she is following her eating plan approximately 70% of the time. Tammy [name] [name] states she is exercising for 0 minutes 0 times per week.  Today's visit was #: 14 Starting weight: 334 lbs Starting date: 05/25/2018 Today's weight: 334 lbs Today's date: 05/19/2019 Total lbs lost to date: 0 Total lbs lost since last in-office visit: 10 lbs  Interim History: Eating fewer sweets. Taking a daily Women's vitamin.   Subjective:   1. Essential hypertension Review: taking medications as instructed, no chest pain on exertion, no dyspnea on exertion, no swelling of ankles.  She is taking amlodipine, Coreg, and Lasix. Her pulse in in the low 50s and the patient endorses fatigue.   BP Readings from Last 3 Encounters:  05/19/19 136/80  05/04/19 117/76  04/21/19 117/76   2. Other hyperlipidemia Tammy [name] [name] has hyperlipidemia and has been trying to improve her cholesterol levels with intensive lifestyle modification including a low saturated fat diet, exercise and weight loss. She denies any chest pain, claudication or myalgias.  Her ASCVD is 9.2%.  Lab Results  Component Value Date   ALT 14 04/12/2019   AST 15 04/12/2019   ALKPHOS 89 04/12/2019   BILITOT 0.6 04/12/2019   Lab Results  Component Value Date   CHOL 217 (H) 04/12/2019   HDL 73.60 04/12/2019   LDLCALC 133 (H) 04/12/2019   LDLDIRECT 129.1 10/17/2009   TRIG 52.0 04/12/2019   CHOLHDL 3 04/12/2019   3. Type 2 diabetes mellitus without complication, without long-term current use of insulin (HCC) Medications reviewed. Home glucose monitoring: is performed regularly, further diabetic ROS: no polyuria or polydipsia, no chest pain, dyspnea or TIA's, no numbness, tingling or pain in extremities.  She is taking metformin.  Lab Results    Component Value Date   HGBA1C 6.0 04/12/2019   HGBA1C 5.9 (H) 10/08/2018   HGBA1C 6.1 (H) 06/03/2018   Lab Results  Component Value Date   MICROALBUR <0.7 04/07/2019   LDLCALC 133 (H) 04/12/2019   CREATININE 0.92 04/12/2019   4. Vitamin D deficiency Tammy [name] [name]'s Vitamin D level was 6.8 on 06/03/2018. She is currently taking vit D. She denies nausea, vomiting or muscle weakness.  5. Osteoarthritis of both knees, unspecified osteoarthritis type Tammy [name] [name] has chronic pain in both knees.  She is followed by orthopedics.     6. At risk for heart disease The 10-year ASCVD risk score Tammy [name] [name] DC Jr., et al., 2013) is: 14.5%  Assessment/Plan:   1. Essential hypertension with bradycardia and fatigue Tammy [name] [name] is working on healthy weight loss and exercise to improve blood pressure control. We will watch for signs of hypotension as she continues her lifestyle modifications.  Decrease Coreg by 1/2 tablet twice daily. She will monitor her BP and HR daily.  2. Other hyperlipidemia Cardiovascular risk and specific lipid/LDL goals reviewed.  We discussed several lifestyle modifications today and Tammy [name] [name] will continue to work on diet, exercise and weight loss efforts. Orders and follow up as documented in patient record.   Counseling Intensive lifestyle modifications are the first line treatment for this issue. . Dietary changes: Increase soluble fiber. Decrease simple carbohydrates. . Exercise changes: Moderate to vigorous-intensity aerobic activity 150 minutes per week if tolerated. . Lipid-lowering medications: see documented in medical record.  3. Type 2 diabetes mellitus without complication,  without long-term current use of insulin (HCC) Good blood sugar control is important to decrease the likelihood of diabetic complications such as nephropathy, neuropathy, limb loss, blindness, coronary artery disease, and death. Intensive lifestyle modification including diet, exercise and weight loss were discussed  as the first line treatment for diabetes.   4. Vitamin D deficiency Low Vitamin D level contributes to fatigue and are associated with obesity, breast, and colon cancer. She agrees to continue to take prescription Vitamin D @50 ,000 IU every week and will follow-up for routine testing of Vitamin D, at least 2-3 times per year to avoid over-replacement.  Will check labs at next visit.  5. Osteoarthritis of both knees, unspecified osteoarthritis type Reviewed xray from 04/26/2018.  Will follow because mobility and pain control are important for weight management.  6. At risk for heart disease Tammy [name] [name] was given approximately 15 minutes of coronary artery disease prevention counseling today. She is 59 y.o. female and has risk factors for heart disease including obesity. We discussed intensive lifestyle modifications today with an emphasis on specific weight loss instructions and strategies.   7. Class 3 severe obesity with serious comorbidity and body mass index (BMI) of 50.0 to 59.9 in adult, unspecified obesity type (HCC) Tammy [name] [name] is currently in the action stage of change. As such, her goal is to continue with weight loss efforts. She has agreed to on the Category 3 Plan.   Exercise goals today: For substantial health benefits, adults should do at least 150 minutes (2 hours and 30 minutes) a week of moderate-intensity, or 75 minutes (1 hour and 15 minutes) a week of vigorous-intensity aerobic physical activity, or an equivalent combination of moderate- and vigorous-intensity aerobic activity. Aerobic activity should be performed in episodes of at least 10 minutes, and preferably, it should be spread throughout the week. Adults should also include muscle-strengthening activities that involve all major muscle groups on 2 or more days a week.  Behavioral modification strategies: emotional eating strategies.  Tammy [name] [name] Tammy [name] [name] has agreed to follow-up with our clinic in 2 weeks. She was informed of the  importance of frequent follow-up visits to maximize her success with intensive lifestyle modifications for her multiple health conditions.   Objective:   Blood pressure 136/80, pulse (!) 52, temperature 98.2 F (36.8 C), temperature source Oral, height 5\' 5"  (1.651 m), weight (!) 334 lb (151.5 kg), SpO2 100 %. Body mass index is 55.58 kg/m.  General: Cooperative, alert, well developed, in no acute distress. HEENT: Conjunctivae and lids unremarkable. Neck: No thyromegaly.  Cardiovascular: Regular rhythm.  Lungs: Normal work of breathing. Extremities: No edema.  Neurologic: No focal deficits.   Lab Results  Component Value Date   CREATININE 0.92 04/12/2019   BUN 13 04/12/2019   NA 138 04/12/2019   K 3.8 04/12/2019   CL 102 04/12/2019   CO2 29 04/12/2019   Lab Results  Component Value Date   ALT 14 04/12/2019   AST 15 04/12/2019   ALKPHOS 89 04/12/2019   BILITOT 0.6 04/12/2019   Lab Results  Component Value Date   HGBA1C 6.0 04/12/2019   HGBA1C 5.9 (H) 10/08/2018   HGBA1C 6.1 (H) 06/03/2018   HGBA1C 6.1 03/10/2018   HGBA1C 5.9 08/07/2017   Lab Results  Component Value Date   INSULIN 6.9 06/03/2018   Lab Results  Component Value Date   TSH 1.850 06/03/2018   Lab Results  Component Value Date   CHOL 217 (H) 04/12/2019   HDL 73.60 04/12/2019   [place]  133 (H) 04/12/2019   LDLDIRECT 129.1 10/17/2009   TRIG 52.0 04/12/2019   CHOLHDL 3 04/12/2019   Lab Results  Component Value Date   WBC 2.7 (L) 10/08/2018   HGB 12.6 10/08/2018   HCT 39.1 10/08/2018   MCV 88.1 10/08/2018   PLT 254 10/08/2018   Attestation Statements:   Reviewed by clinician on day of visit: allergies, medications, problem list, medical history, surgical history, family history, social history, and previous encounter notes.  I, Water quality scientist, CMA, am acting as Location manager for [employer], DO.  I have reviewed the above documentation for accuracy and completeness, and I agree with the  above. Briscoe Deutscher, DO

## 2019-05-24 ENCOUNTER — Other Ambulatory Visit (INDEPENDENT_AMBULATORY_CARE_PROVIDER_SITE_OTHER): Payer: Self-pay | Admitting: Family Medicine

## 2019-05-24 DIAGNOSIS — I10 Essential (primary) hypertension: Secondary | ICD-10-CM

## 2019-05-24 DIAGNOSIS — E559 Vitamin D deficiency, unspecified: Secondary | ICD-10-CM

## 2019-05-24 MED ORDER — VITAMIN D (ERGOCALCIFEROL) 1.25 MG (50000 UNIT) PO CAPS: 50000.0000 [IU] | ORAL_CAPSULE | ORAL | 0 refills | Status: DC

## 2019-05-24 MED ORDER — AMLODIPINE BESYLATE 2.5 MG PO TABS: 2.5000 mg | ORAL_TABLET | Freq: Two times a day (BID) | ORAL | 0 refills | Status: DC

## 2019-06-09 ENCOUNTER — Encounter: Payer: Self-pay | Admitting: Family Medicine

## 2019-06-09 NOTE — Telephone Encounter (Signed)
Everyone is different but it could drop --- she may get very light headed and dizzy from low blood sugar so definitely drink enough fluids.

## 2019-06-15 ENCOUNTER — Other Ambulatory Visit: Payer: Self-pay

## 2019-06-15 ENCOUNTER — Ambulatory Visit
Admission: RE | Admit: 2019-06-15 | Discharge: 2019-06-15 | Disposition: A | Discharge status: Home / Self Care | Payer: 59 | Source: Ambulatory Visit | Attending: Family Medicine | Admitting: Family Medicine

## 2019-06-15 DIAGNOSIS — Z1231 Encounter for screening mammogram for malignant neoplasm of breast: Secondary | ICD-10-CM

## 2019-06-16 ENCOUNTER — Encounter (INDEPENDENT_AMBULATORY_CARE_PROVIDER_SITE_OTHER): Payer: Self-pay | Admitting: Family Medicine

## 2019-06-16 ENCOUNTER — Ambulatory Visit (INDEPENDENT_AMBULATORY_CARE_PROVIDER_SITE_OTHER): Payer: 59 | Admitting: Family Medicine

## 2019-06-16 ENCOUNTER — Other Ambulatory Visit: Payer: Self-pay

## 2019-06-16 VITALS — BP 132/62 | HR 50 | Temp 97.7°F | Ht 65.0 in | Wt 323.0 lb

## 2019-06-16 DIAGNOSIS — E1169 Type 2 diabetes mellitus with other specified complication: Secondary | ICD-10-CM

## 2019-06-16 DIAGNOSIS — E1159 Type 2 diabetes mellitus with other circulatory complications: Secondary | ICD-10-CM | POA: Diagnosis not present

## 2019-06-16 DIAGNOSIS — G8929 Other chronic pain: Secondary | ICD-10-CM

## 2019-06-16 DIAGNOSIS — Z9189 Other specified personal risk factors, not elsewhere classified: Secondary | ICD-10-CM | POA: Diagnosis not present

## 2019-06-16 DIAGNOSIS — E559 Vitamin D deficiency, unspecified: Secondary | ICD-10-CM

## 2019-06-16 DIAGNOSIS — E119 Type 2 diabetes mellitus without complications: Secondary | ICD-10-CM

## 2019-06-16 DIAGNOSIS — E785 Hyperlipidemia, unspecified: Secondary | ICD-10-CM

## 2019-06-16 DIAGNOSIS — I10 Essential (primary) hypertension: Secondary | ICD-10-CM

## 2019-06-16 DIAGNOSIS — I152 Hypertension secondary to endocrine disorders: Secondary | ICD-10-CM

## 2019-06-16 DIAGNOSIS — Z6841 Body Mass Index (BMI) 40.0 and over, adult: Secondary | ICD-10-CM

## 2019-06-16 NOTE — Progress Notes (Signed)
Chief Complaint:   OBESITY Tammy [name] is here to discuss her progress with her obesity treatment plan along with follow-up of her obesity related diagnoses. Tammy [name] is on the Category 3 Plan and states she is following her eating plan approximately 70% of the time. Tammy [name] states she is exercising for 0 minutes 0 times per week.  Today's visit was #: 15 Starting weight: 334 lbs Starting date: 05/25/2018 Today's weight: 323 lbs Today's date: 06/16/2019 Total lbs lost to date: 11 lbs Total lbs lost since last in-office visit: 11 lbs  Interim History: Tammy [name] is on day 4/6 of fasting.  Drinking only water.  Drinking juice with her medicine.  Subjective:   1. Hypertension associated with diabetes (Tammy [name]) Review: taking medications as instructed, no medication side effects noted, no chest pain on exertion, no dyspnea on exertion, no swelling of ankles.  She is taking amlodipine, Coreg, and Lasix for blood pressure control.  BP Readings from Last 3 Encounters:  06/16/19 132/62  05/19/19 136/80  05/04/19 117/76   2. Hyperlipidemia associated with type 2 diabetes mellitus (Tammy [name]) Tammy [name] has hyperlipidemia and has been trying to improve her cholesterol levels with intensive lifestyle modification including a low saturated fat diet, exercise and weight loss. She denies any chest pain, claudication or myalgias.  ASCVD is 9.2.  Lab Results  Component Value Date   ALT 14 04/12/2019   AST 15 04/12/2019   ALKPHOS 89 04/12/2019   BILITOT 0.6 04/12/2019   Lab Results  Component Value Date   CHOL 217 (H) 04/12/2019   HDL 73.60 04/12/2019   LDLCALC 133 (H) 04/12/2019   LDLDIRECT 129.1 10/17/2009   TRIG 52.0 04/12/2019   CHOLHDL 3 04/12/2019   3. Type 2 diabetes mellitus without complication, without long-term current use of insulin (Tammy [name]) Medications reviewed. She is taking metformin 500 mg daily.  Lab Results  Component Value Date   HGBA1C 6.0 04/12/2019   HGBA1C 5.9 (H) 10/08/2018   HGBA1C 6.1 (H) 06/03/2018   Lab Results  Component Value Date   MICROALBUR <0.7 04/07/2019   LDLCALC 133 (H) 04/12/2019   CREATININE 0.92 04/12/2019   Lab Results  Component Value Date   INSULIN 6.9 06/03/2018   4. Vitamin D deficiency Tammy [name]'s Vitamin D level was 6.8 on 06/03/2018. She is currently taking vit D. She denies nausea, vomiting or muscle weakness.  5. Other chronic pain Tammy [name] has chronic pain in bilateral knees.  6. At risk for hypoglycemia Tammy [name] is at increased risk for hypoglycemia due fasting.   Assessment/Plan:   1. Hypertension associated with diabetes (Tammy [name]) Harpreet is working on healthy weight loss and exercise to improve blood pressure control. We will watch for signs of hypotension as she continues her lifestyle modifications.  2. Hyperlipidemia associated with type 2 diabetes mellitus (Tammy [name]) Cardiovascular risk and specific lipid/LDL goals reviewed.  We discussed several lifestyle modifications today and Lataisha will continue to work on diet, exercise and weight loss efforts. Orders and follow up as documented in patient record.   Counseling Intensive lifestyle modifications are the first line treatment for this issue. . Dietary changes: Increase soluble fiber. Decrease simple carbohydrates. . Exercise changes: Moderate to vigorous-intensity aerobic activity 150 minutes per week if tolerated. . Lipid-lowering medications: see documented in medical record.  3. Type 2 diabetes mellitus without complication, without long-term current use of insulin (Tammy [name]) Good blood sugar control is important to decrease the likelihood of diabetic complications such as nephropathy, neuropathy, limb loss, blindness, coronary artery disease,  and death. Intensive lifestyle modification including diet, exercise and weight loss are the first line of treatment for diabetes.   4. Vitamin D deficiency Low Vitamin D level contributes to fatigue and are associated with obesity, breast,  and colon cancer. She agrees to continue to take prescription Vitamin D @50 ,000 IU every week and will follow-up for routine testing of Vitamin D, at least 2-3 times per year to avoid over-replacement.  5. Other chronic pain Will follow because mobility and pain control are important for weight management.  6. At risk for hypoglycemia Tammy [name] was given approximately 15 minutes of counseling today regarding prevention of hypoglycemia. She was advised of symptoms of hypoglycemia. Tammy [name] was instructed to avoid skipping meals, eat regular protein rich meals and schedule low calorie snacks as needed.   Repetitive spaced learning was employed today to elicit superior memory formation and behavioral change  7. Class 3 severe obesity with serious comorbidity and body mass index (BMI) of 50.0 to 59.9 in adult, unspecified obesity type (Tammy [name]) Tammy [name] is currently in the action stage of change. As such, her goal is to continue with weight loss efforts. She has agreed to the Category 3 Plan.   Exercise goals: All adults should avoid inactivity. Some physical activity is better than none, and adults who participate in any amount of physical activity gain some health benefits.  Behavioral modification strategies: Tammy [name] is fasting for 2 more days.  She will increase to full Category 3 slowly.  Tammy [name] has agreed to follow-up with our clinic in 2 weeks. She was informed of the importance of frequent follow-up visits to maximize her success with intensive lifestyle modifications for her multiple health conditions.   Objective:   Blood pressure 132/62, pulse (!) 50, temperature 97.7 F (36.5 C), temperature source Oral, height 5\' 5"  (1.651 m), weight (!) 323 lb (146.5 kg), SpO2 100 %. Body mass index is 53.75 kg/m.  General: Cooperative, alert, well developed, in no acute distress. HEENT: Conjunctivae and lids unremarkable. Cardiovascular: Regular rhythm.  Lungs: Normal work of breathing. Neurologic: No  focal deficits.   Lab Results  Component Value Date   CREATININE 0.92 04/12/2019   BUN 13 04/12/2019   NA 138 04/12/2019   K 3.8 04/12/2019   CL 102 04/12/2019   CO2 29 04/12/2019   Lab Results  Component Value Date   ALT 14 04/12/2019   AST 15 04/12/2019   ALKPHOS 89 04/12/2019   BILITOT 0.6 04/12/2019   Lab Results  Component Value Date   HGBA1C 6.0 04/12/2019   HGBA1C 5.9 (H) 10/08/2018   HGBA1C 6.1 (H) 06/03/2018   HGBA1C 6.1 03/10/2018   HGBA1C 5.9 08/07/2017   Lab Results  Component Value Date   INSULIN 6.9 06/03/2018   Lab Results  Component Value Date   TSH 1.850 06/03/2018   Lab Results  Component Value Date   CHOL 217 (H) 04/12/2019   HDL 73.60 04/12/2019   LDLCALC 133 (H) 04/12/2019   LDLDIRECT 129.1 10/17/2009   TRIG 52.0 04/12/2019   CHOLHDL 3 04/12/2019   Lab Results  Component Value Date   WBC 2.7 (L) 10/08/2018   HGB 12.6 10/08/2018   HCT 39.1 10/08/2018   MCV 88.1 10/08/2018   PLT 254 10/08/2018   Attestation Statements:   Reviewed by clinician on day of visit: allergies, medications, problem list, medical history, surgical history, family history, social history, and previous encounter notes.  I, Water quality scientist, CMA, am acting as Location manager for [employer], DO.  I have reviewed the above documentation for accuracy and completeness, and I agree with the above. Briscoe Deutscher, DO

## 2019-06-20 ENCOUNTER — Ambulatory Visit (INDEPENDENT_AMBULATORY_CARE_PROVIDER_SITE_OTHER): Payer: 59 | Admitting: Family Medicine

## 2019-06-29 ENCOUNTER — Ambulatory Visit (INDEPENDENT_AMBULATORY_CARE_PROVIDER_SITE_OTHER): Payer: 59 | Admitting: Family Medicine

## 2019-06-29 ENCOUNTER — Other Ambulatory Visit: Payer: Self-pay

## 2019-06-29 ENCOUNTER — Encounter (INDEPENDENT_AMBULATORY_CARE_PROVIDER_SITE_OTHER): Payer: Self-pay | Admitting: Family Medicine

## 2019-06-29 VITALS — BP 127/78 | HR 57 | Temp 98.1°F | Ht 65.0 in | Wt 328.0 lb

## 2019-06-29 DIAGNOSIS — I152 Hypertension secondary to endocrine disorders: Secondary | ICD-10-CM

## 2019-06-29 DIAGNOSIS — Z6841 Body Mass Index (BMI) 40.0 and over, adult: Secondary | ICD-10-CM

## 2019-06-29 DIAGNOSIS — Z9189 Other specified personal risk factors, not elsewhere classified: Secondary | ICD-10-CM | POA: Diagnosis not present

## 2019-06-29 DIAGNOSIS — E1159 Type 2 diabetes mellitus with other circulatory complications: Secondary | ICD-10-CM

## 2019-06-29 DIAGNOSIS — I10 Essential (primary) hypertension: Secondary | ICD-10-CM

## 2019-06-29 DIAGNOSIS — E785 Hyperlipidemia, unspecified: Secondary | ICD-10-CM

## 2019-06-29 DIAGNOSIS — Z9884 Bariatric surgery status: Secondary | ICD-10-CM

## 2019-06-29 DIAGNOSIS — E1169 Type 2 diabetes mellitus with other specified complication: Secondary | ICD-10-CM | POA: Diagnosis not present

## 2019-06-29 DIAGNOSIS — E119 Type 2 diabetes mellitus without complications: Secondary | ICD-10-CM

## 2019-06-29 DIAGNOSIS — E559 Vitamin D deficiency, unspecified: Secondary | ICD-10-CM | POA: Diagnosis not present

## 2019-06-30 NOTE — Progress Notes (Signed)
Chief Complaint:   OBESITY [name] is here to discuss her progress with her obesity treatment plan along with follow-up of her obesity related diagnoses. [name] is on the Category 3 Plan and states she is following her eating plan approximately 20% of the time. [name] states she is doing sitting/arm exercises for 20 minutes 3 times per week.  Today's visit was #: 16 Starting weight: 334 lbs Starting date: 05/25/2018 Today's weight: 328 lbs Today's date: 06/30/2019 Total lbs lost to date: 6 lbs Total lbs lost since last in-office visit: 0  Interim History: [name] says she fasted and felt great. She then ate shrimp, stromboli, and salad and had increased edema.  She wants to work on [employer].  Subjective:   1. Type 2 diabetes mellitus without complication, without long-term current use of insulin (HCC) Medications reviewed. Home glucose monitoring: is performed regularly.  She is taking metformin 500 mg daily.  Lab Results  Component Value Date   HGBA1C 6.0 04/12/2019   HGBA1C 5.9 (H) 10/08/2018   HGBA1C 6.1 (H) 06/03/2018   Lab Results  Component Value Date   MICROALBUR <0.7 04/07/2019   LDLCALC 133 (H) 04/12/2019   CREATININE 0.92 04/12/2019   Lab Results  Component Value Date   INSULIN 6.9 06/03/2018   2. Hypertension associated with diabetes ([place]) Review: taking medications as instructed, no medication side effects noted, no chest pain on exertion, no dyspnea on exertion, no swelling of ankles.  She takes Coreg for blood pressure.  BP Readings from Last 3 Encounters:  06/29/19 127/78  06/16/19 132/62  05/19/19 136/80   3. Hyperlipidemia associated with type 2 diabetes mellitus ([place]) [name] has hyperlipidemia and has been trying to improve her cholesterol levels with intensive lifestyle modification including a low saturated fat diet, exercise and weight loss. She denies any chest pain, claudication or myalgias.    Lab Results  Component Value Date   ALT 14 04/12/2019   AST 15 04/12/2019   ALKPHOS 89 04/12/2019   BILITOT 0.6 04/12/2019   Lab Results  Component Value Date   CHOL 217 (H) 04/12/2019   HDL 73.60 04/12/2019   LDLCALC 133 (H) 04/12/2019   LDLDIRECT 129.1 10/17/2009   TRIG 52.0 04/12/2019   CHOLHDL 3 04/12/2019   4. Vitamin D deficiency [name]'s Vitamin D level was 6.8 on 06/03/2018. She is currently taking vit D. She denies nausea, vomiting or muscle weakness.  5. History of bariatric surgery [name] had bariatric surgery in 2016.  6. At risk for malnutrition [name] is at increased risk for malnutrition due to her history of bariatric surgery.   Assessment/Plan:   1. Type 2 diabetes mellitus without complication, without long-term current use of insulin (HCC) Good blood sugar control is important to decrease the likelihood of diabetic complications such as nephropathy, neuropathy, limb loss, blindness, coronary artery disease, and death. Intensive lifestyle modification including diet, exercise and weight loss are the first line of treatment for diabetes.   2. Hypertension associated with diabetes ([place]) Rashema is working on healthy weight loss and exercise to improve blood pressure control. We will watch for signs of hypotension as she continues her lifestyle modifications.  3. Hyperlipidemia associated with type 2 diabetes mellitus (Kassondra Geil) Cardiovascular risk and specific lipid/LDL goals reviewed.  We discussed several lifestyle modifications today and Shiwani will continue to work on diet, exercise and weight loss efforts. Orders and follow up as documented in patient record.   Counseling Intensive lifestyle modifications are the first  line treatment for this issue. . Dietary changes: Increase soluble fiber. Decrease simple carbohydrates. . Exercise changes: Moderate to vigorous-intensity aerobic activity 150 minutes per week if tolerated. . Lipid-lowering medications: see documented in medical record.  4. Vitamin  D deficiency Low Vitamin D level contributes to fatigue and are associated with obesity, breast, and colon cancer. She agrees to continue to take prescription Vitamin D @50 ,000 IU every week and will follow-up for routine testing of Vitamin D, at least 2-3 times per year to avoid over-replacement.  5. History of bariatric surgery [name] is at risk for malnutrition due to her previous bariatric surgery.   Counseling  You may need to eat 3 meals and 2 snacks, or 5 small meals each day in order to reach your protein and calorie goals.   Allow at least 15 minutes for each meal so that you can eat mindfully. Listen to your body so that you do not overeat. For most people, your sleeve or pouch will comfortably hold 4-6 ounces.  Eat foods from all food groups. This includes fruits and vegetables, grains, dairy, and meat and other proteins.  Include a protein-rich food at every meal and snack, and eat the protein food first.   You should be taking a Bariatric Multivitamin as well as calcium.   6. At risk for malnutrition [name] was given approximately 15 minutes of counseling today regarding prevention of malnutrition. [name] was advised that having bariatric surgery increases her risk for anemia, malnutrition, and vitamin deficiencies.   7. Class 3 severe obesity with serious comorbidity and body mass index (BMI) of 50.0 to 59.9 in adult, unspecified obesity type (HCC) [name] is currently in the action stage of change. As such, her goal is to continue with weight loss efforts. She has agreed to the Category 3 Plan.   Exercise goals: As is.  Behavioral modification strategies: increasing lean protein intake, increasing water intake and decreasing sodium intake.  Illona has agreed to follow-up with our clinic in 2 weeks. She was informed of the importance of frequent follow-up visits to maximize her success with intensive lifestyle modifications for her multiple health conditions.   Objective:     Blood pressure 127/78, pulse (!) 57, temperature 98.1 F (36.7 C), temperature source Oral, height 5\' 5"  (1.651 m), weight (!) 328 lb (148.8 kg), SpO2 99 %. Body mass index is 54.58 kg/m.  General: Cooperative, alert, well developed, in no acute distress. HEENT: Conjunctivae and lids unremarkable. Cardiovascular: Regular rhythm.  Lungs: Normal work of breathing. Neurologic: No focal deficits.   Lab Results  Component Value Date   CREATININE 0.92 04/12/2019   BUN 13 04/12/2019   NA 138 04/12/2019   K 3.8 04/12/2019   CL 102 04/12/2019   CO2 29 04/12/2019   Lab Results  Component Value Date   ALT 14 04/12/2019   AST 15 04/12/2019   ALKPHOS 89 04/12/2019   BILITOT 0.6 04/12/2019   Lab Results  Component Value Date   HGBA1C 6.0 04/12/2019   HGBA1C 5.9 (H) 10/08/2018   HGBA1C 6.1 (H) 06/03/2018   HGBA1C 6.1 03/10/2018   HGBA1C 5.9 08/07/2017   Lab Results  Component Value Date   INSULIN 6.9 06/03/2018   Lab Results  Component Value Date   TSH 1.850 06/03/2018   Lab Results  Component Value Date   CHOL 217 (H) 04/12/2019   HDL 73.60 04/12/2019   LDLCALC 133 (H) 04/12/2019   LDLDIRECT 129.1 10/17/2009   TRIG 52.0 04/12/2019  CHOLHDL 3 04/12/2019   Lab Results  Component Value Date   WBC 2.7 (L) 10/08/2018   HGB 12.6 10/08/2018   HCT 39.1 10/08/2018   MCV 88.1 10/08/2018   PLT 254 10/08/2018   Attestation Statements:   Reviewed by clinician on day of visit: allergies, medications, problem list, medical history, surgical history, family history, social history, and previous encounter notes.  I, Water quality scientist, CMA, am acting as Location manager for [employer], DO.  I have reviewed the above documentation for accuracy and completeness, and I agree with the above. Tammy Deutscher, DO

## 2019-07-06 ENCOUNTER — Other Ambulatory Visit (INDEPENDENT_AMBULATORY_CARE_PROVIDER_SITE_OTHER): Payer: Self-pay | Admitting: Family Medicine

## 2019-07-06 DIAGNOSIS — E1151 Type 2 diabetes mellitus with diabetic peripheral angiopathy without gangrene: Secondary | ICD-10-CM

## 2019-07-19 ENCOUNTER — Other Ambulatory Visit: Payer: Self-pay | Admitting: Family Medicine

## 2019-07-19 NOTE — Telephone Encounter (Signed)
Patient calling in regards to metFORMIN (GLUCOPHAGE) 500 MG tablet [id number].   Patient needs a new prescription sent to [facility].

## 2019-07-20 NOTE — Telephone Encounter (Signed)
Previous refills have been coming from weight management doctor. Advised pt to contact them for refill and she voices understanding. Advised pt that per 04/07/19 labs with Dr Carollee Herter she is due to repeat them this month. Pt has upcoming appt with wt mgmt on 07/27/19 and will see if they are going to do labs at that visit. If so, she will complete labs with them. If not, she will call us to schedule lab appt.

## 2019-07-23 ENCOUNTER — Other Ambulatory Visit (INDEPENDENT_AMBULATORY_CARE_PROVIDER_SITE_OTHER): Payer: Self-pay | Admitting: Family Medicine

## 2019-07-23 DIAGNOSIS — E1151 Type 2 diabetes mellitus with diabetic peripheral angiopathy without gangrene: Secondary | ICD-10-CM

## 2019-07-27 ENCOUNTER — Ambulatory Visit (INDEPENDENT_AMBULATORY_CARE_PROVIDER_SITE_OTHER): Payer: 59 | Admitting: Family Medicine

## 2019-08-01 ENCOUNTER — Other Ambulatory Visit: Payer: Self-pay

## 2019-08-02 ENCOUNTER — Ambulatory Visit (INDEPENDENT_AMBULATORY_CARE_PROVIDER_SITE_OTHER): Payer: 59 | Admitting: Family Medicine

## 2019-08-02 ENCOUNTER — Encounter: Payer: Self-pay | Admitting: Family Medicine

## 2019-08-02 ENCOUNTER — Other Ambulatory Visit: Payer: Self-pay

## 2019-08-02 DIAGNOSIS — E1169 Type 2 diabetes mellitus with other specified complication: Secondary | ICD-10-CM

## 2019-08-02 DIAGNOSIS — I10 Essential (primary) hypertension: Secondary | ICD-10-CM

## 2019-08-02 DIAGNOSIS — E1151 Type 2 diabetes mellitus with diabetic peripheral angiopathy without gangrene: Secondary | ICD-10-CM

## 2019-08-02 DIAGNOSIS — Z6841 Body Mass Index (BMI) 40.0 and over, adult: Secondary | ICD-10-CM

## 2019-08-02 DIAGNOSIS — I890 Lymphedema, not elsewhere classified: Secondary | ICD-10-CM

## 2019-08-02 DIAGNOSIS — E785 Hyperlipidemia, unspecified: Secondary | ICD-10-CM

## 2019-08-02 DIAGNOSIS — E66813 Obesity, class 3: Secondary | ICD-10-CM

## 2019-08-02 MED ORDER — CARVEDILOL 12.5 MG PO TABS: 12.5000 mg | ORAL_TABLET | Freq: Two times a day (BID) | ORAL | 3 refills | Status: DC

## 2019-08-02 MED ORDER — AMLODIPINE BESYLATE 2.5 MG PO TABS: 2.5000 mg | ORAL_TABLET | Freq: Two times a day (BID) | ORAL | 0 refills | Status: DC

## 2019-08-02 MED ORDER — METFORMIN HCL 500 MG PO TABS: 500.0000 mg | ORAL_TABLET | Freq: Every day | ORAL | 1 refills | Status: DC

## 2019-08-02 MED ORDER — CARVEDILOL 6.25 MG PO TABS: 6.2500 mg | ORAL_TABLET | Freq: Two times a day (BID) | ORAL | 3 refills | Status: DC

## 2019-08-02 NOTE — Progress Notes (Signed)
Patient ID: Tammy [name], female    [name]: Dec 19, 1960  Age: 59 y.o. MRN: [id number]    Subjective:  Subjective  HPIf/u  Tammy [name] presents for f/u   She needs refills on some meds and labs drawn.    No complaints   Review of Systems  Constitutional: Negative for appetite change, diaphoresis, fatigue and unexpected weight change.  Eyes: Negative for pain, redness and visual disturbance.  Respiratory: Negative for cough, chest tightness, shortness of breath and wheezing.   Cardiovascular: Negative for chest pain, palpitations and leg swelling.  Endocrine: Negative for cold intolerance, heat intolerance, polydipsia, polyphagia and polyuria.  Genitourinary: Negative for difficulty urinating, dysuria and frequency.  Neurological: Negative for dizziness, light-headedness, numbness and headaches.    History Past Medical History:  Diagnosis Date  . Acute on chronic appendicitis s/p lap appy 06/10/2012 06/10/2012   surgery done 06-10-12  . Arthritis    ankle  . Back pain   . Chest pain   . Complication of anesthesia   . Constipation   . Diabetes mellitus    TYPE 2- no meds in several months-was taken off meds -monitoring blood sugar.  Marland Kitchen GERD (gastroesophageal reflux disease)   . History of colon polyps   . Hyperlipidemia   . Hypertension   . Joint pain   . Lactose intolerance   . Morbid obesity - BMI > 50 12/13/2012  . Pneumonia    none recent  . PONV (postoperative nausea and vomiting)   . Poor venous access    "usually difficult stick"  . Prediabetes   . Sinusitis   . SOB (shortness of breath)   . Swelling   . Vitamin D deficiency     She has a past surgical history that includes Abdominal hysterectomy; Knee surgery (Right); Upper gastrointestinal endoscopy (04/14/2006); Colonoscopy (04/11/11); laparoscopic appendectomy (06/10/2012); Appendectomy; Gastric Roux-En-Y (N/A, 02/27/2015); and Colonoscopy with propofol (N/A, 05/11/2017).   Her family history includes Breast  cancer in her paternal aunt and paternal aunt; Cancer (age of onset: 32) in her mother; Diabetes in her father and paternal grandmother; Heart disease in her father; Heart failure in her father; Hyperlipidemia in her brother and mother; Hypertension in her mother.She reports that she has never smoked. She has never used smokeless tobacco. She reports that she does not drink alcohol or use drugs.  Current Outpatient Medications on File Prior to Visit  Medication Sig Dispense Refill  . furosemide (LASIX) 40 MG tablet Take 1 tablet (40 mg total) by mouth daily. 90 tablet 3  . glucose blood test strip Check blood sugar twice daily 100 each 11  . loratadine (CLARITIN) 10 MG tablet Take 1 tablet (10 mg total) by mouth daily. 90 tablet 3  . methocarbamol (ROBAXIN) 500 MG tablet Take 1 tablet (500 mg total) by mouth every 6 (six) hours as needed for muscle spasms. 45 tablet 0  . Multiple Vitamins-Minerals (MULTIVITAMIN ADULT PO) Take 1 tablet daily by mouth. flintstone vitamins    . NONFORMULARY OR COMPOUNDED ITEM Thigh high compression socks  20-30 mmhg    Re- low ext edema 1 each 1  . pantoprazole (PROTONIX) 40 MG tablet Take 2 tablets (80 mg total) by mouth daily. 180 tablet 3  . Vitamin D, Ergocalciferol, (DRISDOL) 1.25 MG (50000 UNIT) CAPS capsule Take 1 capsule (50,000 Units total) by mouth every 7 (seven) days. 12 capsule 0   No current facility-administered medications on file prior to visit.     Objective:  Objective  Physical Exam Vitals and nursing note reviewed.  Constitutional:      Appearance: She is well-developed.  HENT:     Head: Normocephalic and atraumatic.  Eyes:     Conjunctiva/sclera: Conjunctivae normal.  Neck:     Thyroid: No thyromegaly.     Vascular: No carotid bruit or JVD.  Cardiovascular:     Rate and Rhythm: Normal rate and regular rhythm.     Heart sounds: Normal heart sounds. No murmur.  Pulmonary:     Effort: Pulmonary effort is normal. No respiratory  distress.     Breath sounds: Normal breath sounds. No wheezing or rales.  Chest:     Chest wall: No tenderness.  Musculoskeletal:     Cervical back: Normal range of motion and neck supple.  Neurological:     Mental Status: She is alert and oriented to person, place, and time.    BP 138/82 (BP Location: Right Arm, Patient Position: Sitting, Cuff Size: Large)   Pulse (!) 58   Temp (!) 97 F (36.1 C) (Temporal)   Resp 18   Ht 5\' 5"  (1.651 m)   Wt (!) 327 lb 3.2 oz (148.4 kg)   SpO2 98%   BMI 54.45 kg/m  Wt Readings from Last 3 Encounters:  08/02/19 (!) 327 lb 3.2 oz (148.4 kg)  06/29/19 (!) 328 lb (148.8 kg)  06/16/19 (!) 323 lb (146.5 kg)     Lab Results  Component Value Date   WBC 2.7 (L) 10/08/2018   HGB 12.6 10/08/2018   HCT 39.1 10/08/2018   PLT 254 10/08/2018   GLUCOSE 96 04/12/2019   CHOL 217 (H) 04/12/2019   TRIG 52.0 04/12/2019   HDL 73.60 04/12/2019   LDLDIRECT 129.1 10/17/2009   LDLCALC 133 (H) 04/12/2019   ALT 14 04/12/2019   AST 15 04/12/2019   NA 138 04/12/2019   K 3.8 04/12/2019   CL 102 04/12/2019   CREATININE 0.92 04/12/2019   BUN 13 04/12/2019   CO2 29 04/12/2019   TSH 1.850 06/03/2018   INR 1.1 (H) 10/16/2014   HGBA1C 6.0 04/12/2019   MICROALBUR <0.7 04/07/2019    MM DIGITAL SCREENING BILATERAL  Result Date: 06/17/2019 CLINICAL DATA:  Screening. EXAM: DIGITAL SCREENING BILATERAL MAMMOGRAM WITH CAD COMPARISON:  Previous exam(s). ACR Breast Density Category b: There are scattered areas of fibroglandular density. FINDINGS: There are no findings suspicious for malignancy. Images were processed with CAD. IMPRESSION: No mammographic evidence of malignancy. A result letter of this screening mammogram will be mailed directly to the patient. RECOMMENDATION: Screening mammogram in one year. (Code:[id number]) BI-RADS CATEGORY  1: Negative. Electronically Signed   By: Ammie Ferrier M.D.   On: 06/17/2019 11:30     Assessment & Plan:  Plan  I have  discontinued Tammy [name]'s carvedilol and carvedilol. I am also having her start on carvedilol. Additionally, I am having her maintain her glucose blood, Multiple Vitamins-Minerals (MULTIVITAMIN ADULT PO), NONFORMULARY OR COMPOUNDED ITEM, pantoprazole, furosemide, loratadine, methocarbamol, Vitamin D (Ergocalciferol), amLODipine, and metFORMIN.  Meds ordered this encounter  Medications  . amLODipine (NORVASC) 2.5 MG tablet    Sig: Take 1 tablet (2.5 mg total) by mouth 2 (two) times daily.    Dispense:  180 tablet    Refill:  0    Requesting 1 year supply  . DISCONTD: carvedilol (COREG) 12.5 MG tablet    Sig: Take 1 tablet (12.5 mg total) by mouth 2 (two) times daily with a meal.    Dispense:  180 tablet    Refill:  3    Requesting 1 year supply  . metFORMIN (GLUCOPHAGE) 500 MG tablet    Sig: Take 1 tablet (500 mg total) by mouth daily with breakfast.    Dispense:  90 tablet    Refill:  1  . carvedilol (COREG) 6.25 MG tablet    Sig: Take 1 tablet (6.25 mg total) by mouth 2 (two) times daily with a meal.    Dispense:  180 tablet    Refill:  3    Problem List Items Addressed This Visit      Unprioritized   Class 3 severe obesity with serious comorbidity and body mass index (BMI) of 50.0 to 59.9 in adult (HCC)    con't healthy weight and wellness       Relevant Medications   metFORMIN (GLUCOPHAGE) 500 MG tablet   DM (diabetes mellitus) type II, controlled, with peripheral vascular disorder ([place])    Check labs hgba1c to be checked , minimize simple carbs. Increase exercise as tolerated. Continue current meds       Relevant Medications   amLODipine (NORVASC) 2.5 MG tablet   metFORMIN (GLUCOPHAGE) 500 MG tablet   carvedilol (COREG) 6.25 MG tablet   Other Relevant Orders   Lipid panel   Hemoglobin A1c   Comprehensive metabolic panel   Insulin, random   Essential hypertension    Well controlled, no changes to meds. Encouraged heart healthy diet such as the DASH diet and  exercise as tolerated.       Relevant Medications   amLODipine (NORVASC) 2.5 MG tablet   carvedilol (COREG) 6.25 MG tablet   Other Relevant Orders   Lipid panel   Hemoglobin A1c   Comprehensive metabolic panel   TSH   Vitamin D (25 hydroxy)   Insulin, random   Hyperlipidemia associated with type 2 diabetes mellitus ([place])    Encouraged heart healthy diet, increase exercise, avoid trans fats, consider a krill oil cap daily      Relevant Medications   metFORMIN (GLUCOPHAGE) 500 MG tablet   Lymphedema    con't with PT and wraps      Morbid obesity - BMI > 50 - Primary   Relevant Medications   metFORMIN (GLUCOPHAGE) 500 MG tablet   Other Relevant Orders   Lipid panel   Hemoglobin A1c   Comprehensive metabolic panel   TSH   Vitamin D (25 hydroxy)   Insulin, random      Follow-up: Return in about 6 months (around 02/02/2020), or if symptoms worsen or fail to improve, for annual exam, fasting.  [name], DO

## 2019-08-02 NOTE — Assessment & Plan Note (Signed)
Check labs  hgba1c to be checked, minimize simple carbs. Increase exercise as tolerated. Continue current meds  

## 2019-08-02 NOTE — Assessment & Plan Note (Signed)
Well controlled, no changes to meds. Encouraged heart healthy diet such as the DASH diet and exercise as tolerated.  °

## 2019-08-02 NOTE — Assessment & Plan Note (Signed)
con't healthy weight and wellness 

## 2019-08-02 NOTE — Patient Instructions (Signed)
DASH Eating Plan DASH stands for "Dietary Approaches to Stop Hypertension." The DASH eating plan is a healthy eating plan that has been shown to reduce high blood pressure (hypertension). It may also reduce your risk for type 2 diabetes, heart disease, and stroke. The DASH eating plan may also help with weight loss. What are tips for following this plan?  General guidelines  Avoid eating more than 2,300 mg (milligrams) of salt (sodium) a day. If you have hypertension, you may need to reduce your sodium intake to 1,500 mg a day.  Limit alcohol intake to no more than 1 drink a day for nonpregnant women and 2 drinks a day for men. One drink equals 12 oz of beer, 5 oz of wine, or 1 oz of hard liquor.  Work with your health care provider to maintain a healthy body weight or to lose weight. Ask what an ideal weight is for you.  Get at least 30 minutes of exercise that causes your heart to beat faster (aerobic exercise) most days of the week. Activities may include walking, swimming, or biking.  Work with your health care provider or diet and nutrition specialist (dietitian) to adjust your eating plan to your individual calorie needs. Reading food labels   Check food labels for the amount of sodium per serving. Choose foods with less than 5 percent of the Daily Value of sodium. Generally, foods with less than 300 mg of sodium per serving fit into this eating plan.  To find whole grains, look for the word "whole" as the first word in the ingredient list. Shopping  Buy products labeled as "low-sodium" or "no salt added."  Buy fresh foods. Avoid canned foods and premade or frozen meals. Cooking  Avoid adding salt when cooking. Use salt-free seasonings or herbs instead of table salt or sea salt. Check with your health care provider or pharmacist before using salt substitutes.  Do not fry foods. Cook foods using healthy methods such as baking, boiling, grilling, and broiling instead.  Cook with  heart-healthy oils, such as olive, canola, soybean, or sunflower oil. Meal planning  Eat a balanced diet that includes: ? 5 or more servings of fruits and vegetables each day. At each meal, try to fill half of your plate with fruits and vegetables. ? Up to 6-8 servings of whole grains each day. ? Less than 6 oz of lean meat, poultry, or fish each day. A 3-oz serving of meat is about the same size as a deck of cards. One egg equals 1 oz. ? 2 servings of low-fat dairy each day. ? A serving of nuts, seeds, or beans 5 times each week. ? Heart-healthy fats. Healthy fats called Omega-3 fatty acids are found in foods such as flaxseeds and coldwater fish, like sardines, salmon, and mackerel.  Limit how much you eat of the following: ? Canned or prepackaged foods. ? Food that is high in trans fat, such as fried foods. ? Food that is high in saturated fat, such as fatty meat. ? Sweets, desserts, sugary drinks, and other foods with added sugar. ? Full-fat dairy products.  Do not salt foods before eating.  Try to eat at least 2 vegetarian meals each week.  Eat more home-cooked food and less restaurant, buffet, and fast food.  When eating at a restaurant, ask that your food be prepared with less salt or no salt, if possible. What foods are recommended? The items listed may not be a complete list. Talk with your dietitian about   what dietary choices are best for you. Grains Whole-grain or whole-wheat bread. Whole-grain or whole-wheat pasta. Brown rice. Oatmeal. Quinoa. Bulgur. Whole-grain and low-sodium cereals. Pita bread. Low-fat, low-sodium crackers. Whole-wheat flour tortillas. Vegetables Fresh or frozen vegetables (raw, steamed, roasted, or grilled). Low-sodium or reduced-sodium tomato and vegetable juice. Low-sodium or reduced-sodium tomato sauce and tomato paste. Low-sodium or reduced-sodium canned vegetables. Fruits All fresh, dried, or frozen fruit. Canned fruit in natural juice (without  added sugar). Meat and other protein foods Skinless chicken or turkey. Ground chicken or turkey. Pork with fat trimmed off. Fish and seafood. Egg whites. Dried beans, peas, or lentils. Unsalted nuts, nut butters, and seeds. Unsalted canned beans. Lean cuts of beef with fat trimmed off. Low-sodium, lean deli meat. Dairy Low-fat (1%) or fat-free (skim) milk. Fat-free, low-fat, or reduced-fat cheeses. Nonfat, low-sodium ricotta or cottage cheese. Low-fat or nonfat yogurt. Low-fat, low-sodium cheese. Fats and oils Soft margarine without trans fats. Vegetable oil. Low-fat, reduced-fat, or light mayonnaise and salad dressings (reduced-sodium). Canola, safflower, olive, soybean, and sunflower oils. Avocado. Seasoning and other foods Herbs. Spices. Seasoning mixes without salt. Unsalted popcorn and pretzels. Fat-free sweets. What foods are not recommended? The items listed may not be a complete list. Talk with your dietitian about what dietary choices are best for you. Grains Baked goods made with fat, such as croissants, muffins, or some breads. Dry pasta or rice meal packs. Vegetables Creamed or fried vegetables. Vegetables in a cheese sauce. Regular canned vegetables (not low-sodium or reduced-sodium). Regular canned tomato sauce and paste (not low-sodium or reduced-sodium). Regular tomato and vegetable juice (not low-sodium or reduced-sodium). Pickles. Olives. Fruits Canned fruit in a light or heavy syrup. Fried fruit. Fruit in cream or butter sauce. Meat and other protein foods Fatty cuts of meat. Ribs. Fried meat. Bacon. Sausage. Bologna and other processed lunch meats. Salami. Fatback. Hotdogs. Bratwurst. Salted nuts and seeds. Canned beans with added salt. Canned or smoked fish. Whole eggs or egg yolks. Chicken or turkey with skin. Dairy Whole or 2% milk, cream, and half-and-half. Whole or full-fat cream cheese. Whole-fat or sweetened yogurt. Full-fat cheese. Nondairy creamers. Whipped toppings.  Processed cheese and cheese spreads. Fats and oils Butter. Stick margarine. Lard. Shortening. Ghee. Bacon fat. Tropical oils, such as coconut, palm kernel, or palm oil. Seasoning and other foods Salted popcorn and pretzels. Onion salt, garlic salt, seasoned salt, table salt, and sea salt. Worcestershire sauce. Tartar sauce. Barbecue sauce. Teriyaki sauce. Soy sauce, including reduced-sodium. Steak sauce. Canned and packaged gravies. Fish sauce. Oyster sauce. Cocktail sauce. Horseradish that you find on the shelf. Ketchup. Mustard. Meat flavorings and tenderizers. Bouillon cubes. Hot sauce and Tabasco sauce. Premade or packaged marinades. Premade or packaged taco seasonings. Relishes. Regular salad dressings. Where to find more information:  National Heart, Lung, and Blood Institute: www.nhlbi.nih.gov  American Heart Association: www.heart.org Summary  The DASH eating plan is a healthy eating plan that has been shown to reduce high blood pressure (hypertension). It may also reduce your risk for type 2 diabetes, heart disease, and stroke.  With the DASH eating plan, you should limit salt (sodium) intake to 2,300 mg a day. If you have hypertension, you may need to reduce your sodium intake to 1,500 mg a day.  When on the DASH eating plan, aim to eat more fresh fruits and vegetables, whole grains, lean proteins, low-fat dairy, and heart-healthy fats.  Work with your health care provider or diet and nutrition specialist (dietitian) to adjust your eating plan to your   individual calorie needs. This information is not intended to replace advice given to you by your health care provider. Make sure you discuss any questions you have with your health care provider. Document Revised: 04/03/2017 Document Reviewed: 04/14/2016 Elsevier Patient Education  2020 Elsevier Inc.  

## 2019-08-02 NOTE — Assessment & Plan Note (Signed)
Encouraged heart healthy diet, increase exercise, avoid trans fats, consider a krill oil cap daily 

## 2019-08-02 NOTE — Assessment & Plan Note (Signed)
con't with PT and wraps

## 2019-08-03 ENCOUNTER — Other Ambulatory Visit: Payer: Self-pay

## 2019-08-03 ENCOUNTER — Other Ambulatory Visit (INDEPENDENT_AMBULATORY_CARE_PROVIDER_SITE_OTHER): Payer: Self-pay | Admitting: Family Medicine

## 2019-08-03 DIAGNOSIS — E1151 Type 2 diabetes mellitus with diabetic peripheral angiopathy without gangrene: Secondary | ICD-10-CM

## 2019-08-03 DIAGNOSIS — E559 Vitamin D deficiency, unspecified: Secondary | ICD-10-CM

## 2019-08-03 LAB — COMPREHENSIVE METABOLIC PANEL
ALT: 13 U/L (ref 0–35)
AST: 17 U/L (ref 0–37)
Albumin: 3.9 g/dL (ref 3.5–5.2)
Alkaline Phosphatase: 67 U/L (ref 39–117)
BUN: 12 mg/dL (ref 6–23)
CO2: 28 mEq/L (ref 19–32)
Calcium: 8.7 mg/dL (ref 8.4–10.5)
Chloride: 104 mEq/L (ref 96–112)
Creatinine, Ser: 0.89 mg/dL (ref 0.40–1.20)
GFR: 78.69 mL/min (ref >60.00)
Glucose, Bld: 89 mg/dL (ref 70–99)
Potassium: 3.9 mEq/L (ref 3.5–5.1)
Sodium: 139 mEq/L (ref 135–145)
Total Bilirubin: 0.7 mg/dL (ref 0.2–1.2)
Total Protein: 7.1 g/dL (ref 6.0–8.3)

## 2019-08-03 LAB — LIPID PANEL
Cholesterol: 195 mg/dL (ref 0–200)
HDL: 61.1 mg/dL (ref >39.00)
LDL Cholesterol: 124 mg/dL — ABNORMAL HIGH (ref 0–99)
NonHDL: 133.81
Total CHOL/HDL Ratio: 3
Triglycerides: 50 mg/dL (ref 0.0–149.0)
VLDL: 10 mg/dL (ref 0.0–40.0)

## 2019-08-03 LAB — VITAMIN D 25 HYDROXY (VIT D DEFICIENCY, FRACTURES): VITD: 18.12 ng/mL — ABNORMAL LOW (ref 30.00–100.00)

## 2019-08-03 LAB — HEMOGLOBIN A1C: Hgb A1c MFr Bld: 5.8 % (ref 4.6–6.5)

## 2019-08-03 LAB — TSH: TSH: 3.13 u[IU]/mL (ref 0.35–4.50)

## 2019-08-03 LAB — INSULIN, RANDOM: Insulin: 3.4 u[IU]/mL

## 2019-08-03 MED ORDER — VITAMIN D (ERGOCALCIFEROL) 1.25 MG (50000 UNIT) PO CAPS: 50000.0000 [IU] | ORAL_CAPSULE | ORAL | 2 refills | Status: DC

## 2019-08-08 ENCOUNTER — Telehealth: Payer: Self-pay | Admitting: Family Medicine

## 2019-08-08 NOTE — Telephone Encounter (Signed)
  Caller : [facility]   carvedilol (COREG) 6.25 MG tablet [id number]  Pharmacy calling in regards to medication, please clarify what dose of medication patient should take. Pharmacy has 6.25 and 12.5 on file.   Call Back # [phone]  Reference # [id number]

## 2019-08-09 NOTE — Telephone Encounter (Signed)
Medication clarified

## 2019-08-10 ENCOUNTER — Encounter (INDEPENDENT_AMBULATORY_CARE_PROVIDER_SITE_OTHER): Payer: Self-pay | Admitting: Family Medicine

## 2019-08-10 ENCOUNTER — Ambulatory Visit (INDEPENDENT_AMBULATORY_CARE_PROVIDER_SITE_OTHER): Payer: 59 | Admitting: Family Medicine

## 2019-08-10 ENCOUNTER — Other Ambulatory Visit: Payer: Self-pay

## 2019-08-10 VITALS — BP 117/77 | HR 51 | Temp 98.6°F | Ht 65.0 in | Wt 323.0 lb

## 2019-08-10 DIAGNOSIS — E1159 Type 2 diabetes mellitus with other circulatory complications: Secondary | ICD-10-CM | POA: Diagnosis not present

## 2019-08-10 DIAGNOSIS — E119 Type 2 diabetes mellitus without complications: Secondary | ICD-10-CM | POA: Diagnosis not present

## 2019-08-10 DIAGNOSIS — I152 Hypertension secondary to endocrine disorders: Secondary | ICD-10-CM

## 2019-08-10 DIAGNOSIS — Z9189 Other specified personal risk factors, not elsewhere classified: Secondary | ICD-10-CM

## 2019-08-10 DIAGNOSIS — I10 Essential (primary) hypertension: Secondary | ICD-10-CM

## 2019-08-10 DIAGNOSIS — Z6841 Body Mass Index (BMI) 40.0 and over, adult: Secondary | ICD-10-CM

## 2019-08-11 NOTE — Progress Notes (Signed)
Chief Complaint:   OBESITY Tammy [name] is here to discuss her progress with her obesity treatment plan along with follow-up of her obesity related diagnoses. Tammy [name] is on the Category 3 Plan and states she is following her eating plan approximately 70% of the time. Tammy [name] states she is exercising for 0 minutes 0 times per week.  Today's visit was #: 56 Starting weight: 334 lbs Starting date: 05/25/2018 Today's weight: 323 lbs Today's date: 08/10/2019 Total lbs lost to date: 11 lbs Total lbs lost since last in-office visit: 5 lbs  Interim History: Tammy [name] is happy with her weight loss.  She has a great attitude regarding self-care and keeping negative people out of her circle.  Subjective:   1. Type 2 diabetes mellitus without complication, without long-term current use of insulin (HCC) Medications reviewed. Diabetic ROS: no polyuria or polydipsia, no chest pain, dyspnea or TIA's, no numbness, tingling or pain in extremities.  She is taking metformin 500 mg daily, but she was out of it for 2 weeks due to an issue with refills.  Lab Results  Component Value Date   HGBA1C 5.8 08/02/2019   HGBA1C 6.0 04/12/2019   HGBA1C 5.9 (H) 10/08/2018   Lab Results  Component Value Date   MICROALBUR <0.7 04/07/2019   LDLCALC 124 (H) 08/02/2019   CREATININE 0.89 08/02/2019   Lab Results  Component Value Date   INSULIN 6.9 06/03/2018   2. Hypertension associated with diabetes ([place]) Review: taking medications as instructed, no medication side effects noted, no chest pain on exertion, no dyspnea on exertion, no swelling of ankles.  She is taking Norvasc and Coreg.  Blood pressure is currently at goal.  BP Readings from Last 3 Encounters:  08/10/19 117/77  08/02/19 138/82  06/29/19 127/78   3. At risk for heart disease The 10-year ASCVD risk score Tammy Bussing DC [name]., et al., 2013) is: 9.6%   Values used to calculate the score:     Age: 59 years     Sex: Female     Is Non-Hispanic African  American: Yes     Diabetic: Yes     Tobacco smoker: No     Systolic Blood Pressure: 123XX123 mmHg     Is BP treated: Yes     HDL Cholesterol: 61.1 mg/dL     Total Cholesterol: 195 mg/dL  Assessment/Plan:   1. Type 2 diabetes mellitus without complication, without long-term current use of insulin (HCC) Good blood sugar control is important to decrease the likelihood of diabetic complications such as nephropathy, neuropathy, limb loss, blindness, coronary artery disease, and death. Intensive lifestyle modification including diet, exercise and weight loss are the first line of treatment for diabetes.   2. Hypertension associated with diabetes ([place]) Tammy [name] is working on healthy weight loss and exercise to improve blood pressure control. We will watch for signs of hypotension as she continues her lifestyle modifications.  3. At risk for heart disease Tammy [name] was given approximately 15 minutes of coronary artery disease prevention counseling today. She is 59 y.o. female and has risk factors for heart disease including obesity. We discussed intensive lifestyle modifications today with an emphasis on specific weight loss instructions and strategies.   Repetitive spaced learning was employed today to elicit superior memory formation and behavioral change.  4. Class 3 severe obesity with serious comorbidity and body mass index (BMI) of 50.0 to 59.9 in adult, unspecified obesity type (HCC) Tammy [name] is currently in the action stage of change. As such, her  goal is to continue with weight loss efforts. She has agreed to the Category 3 Plan.   Exercise goals: All adults should avoid inactivity. Some physical activity is better than none, and adults who participate in any amount of physical activity gain some health benefits.  Behavioral modification strategies: increasing lean protein intake and increasing water intake.  Tammy [name] has agreed to follow-up with our clinic in 2 weeks. She was informed of the  importance of frequent follow-up visits to maximize her success with intensive lifestyle modifications for her multiple health conditions.   Objective:   Blood pressure 117/77, pulse (!) 51, temperature 98.6 F (37 C), temperature source Oral, height 5\' 5"  (1.651 m), weight (!) 323 lb (146.5 kg), SpO2 99 %. Body mass index is 53.75 kg/m.  General: Cooperative, alert, well developed, in no acute distress. HEENT: Conjunctivae and lids unremarkable. Cardiovascular: Regular rhythm.  Lungs: Normal work of breathing. Neurologic: No focal deficits.   Lab Results  Component Value Date   CREATININE 0.89 08/02/2019   BUN 12 08/02/2019   NA 139 08/02/2019   K 3.9 08/02/2019   CL 104 08/02/2019   CO2 28 08/02/2019   Lab Results  Component Value Date   ALT 13 08/02/2019   AST 17 08/02/2019   ALKPHOS 67 08/02/2019   BILITOT 0.7 08/02/2019   Lab Results  Component Value Date   HGBA1C 5.8 08/02/2019   HGBA1C 6.0 04/12/2019   HGBA1C 5.9 (H) 10/08/2018   HGBA1C 6.1 (H) 06/03/2018   HGBA1C 6.1 03/10/2018   Lab Results  Component Value Date   INSULIN 6.9 06/03/2018   Lab Results  Component Value Date   TSH 3.13 08/02/2019   Lab Results  Component Value Date   CHOL 195 08/02/2019   HDL 61.10 08/02/2019   LDLCALC 124 (H) 08/02/2019   LDLDIRECT 129.1 10/17/2009   TRIG 50.0 08/02/2019   CHOLHDL 3 08/02/2019   Lab Results  Component Value Date   WBC 2.7 (L) 10/08/2018   HGB 12.6 10/08/2018   HCT 39.1 10/08/2018   MCV 88.1 10/08/2018   PLT 254 10/08/2018   Attestation Statements:   Reviewed by clinician on day of visit: allergies, medications, problem list, medical history, surgical history, family history, social history, and previous encounter notes.  I, Water quality scientist, CMA, am acting as Location manager for [employer], DO.  I have reviewed the above documentation for accuracy and completeness, and I agree with the above. Briscoe Deutscher, DO

## 2019-09-13 ENCOUNTER — Ambulatory Visit (INDEPENDENT_AMBULATORY_CARE_PROVIDER_SITE_OTHER): Payer: 59 | Admitting: Family Medicine

## 2019-09-13 ENCOUNTER — Encounter (INDEPENDENT_AMBULATORY_CARE_PROVIDER_SITE_OTHER): Payer: Self-pay | Admitting: Family Medicine

## 2019-09-13 ENCOUNTER — Other Ambulatory Visit: Payer: Self-pay

## 2019-09-13 VITALS — BP 131/77 | HR 53 | Temp 98.0°F | Ht 65.0 in | Wt 326.0 lb

## 2019-09-13 DIAGNOSIS — E119 Type 2 diabetes mellitus without complications: Secondary | ICD-10-CM | POA: Diagnosis not present

## 2019-09-13 DIAGNOSIS — I10 Essential (primary) hypertension: Secondary | ICD-10-CM

## 2019-09-13 DIAGNOSIS — E1159 Type 2 diabetes mellitus with other circulatory complications: Secondary | ICD-10-CM

## 2019-09-13 DIAGNOSIS — R609 Edema, unspecified: Secondary | ICD-10-CM | POA: Diagnosis not present

## 2019-09-13 DIAGNOSIS — Z6841 Body Mass Index (BMI) 40.0 and over, adult: Secondary | ICD-10-CM

## 2019-09-13 DIAGNOSIS — I152 Hypertension secondary to endocrine disorders: Secondary | ICD-10-CM

## 2019-09-13 NOTE — Progress Notes (Signed)
Chief Complaint:   OBESITY Tammy [name] is here to discuss her progress with her obesity treatment plan along with follow-up of her obesity related diagnoses. Tammy [name] is on the Category 3 Plan and states she is following her eating plan approximately 40% of the time. Tammy [name] states she is exercising for 0 minutes 0 times per week.  Today's visit was #: 57 Starting weight: 334 lbs Starting date: 05/25/2018 Today's weight: 326 lbs Today's date: 09/13/2019 Total lbs lost to date: 8 lbs Total lbs lost since last in-office visit: 0  Interim History: Tammy [name] has had 2 deaths in her family.  She says she is also dealing with increased stress at work.  Subjective:   1. Hypertension associated with diabetes ([place]) Review: taking medications as instructed, no medication side effects noted, no chest pain on exertion, no dyspnea on exertion, no swelling of ankles.  Her heart rate is low.  She says it is just getting back to normal.  She was out of her medications for a short period of time.  BP Readings from Last 3 Encounters:  09/13/19 131/77  08/10/19 117/77  08/02/19 138/82   2. Type 2 diabetes mellitus without complication, without long-term current use of insulin (HCC) Medications reviewed. Diabetic ROS: no polyuria or polydipsia, no chest pain, dyspnea or TIA's, no numbness, tingling or pain in extremities.   Lab Results  Component Value Date   HGBA1C 5.8 08/02/2019   HGBA1C 6.0 04/12/2019   HGBA1C 5.9 (H) 10/08/2018   Lab Results  Component Value Date   MICROALBUR <0.7 04/07/2019   LDLCALC 124 (H) 08/02/2019   CREATININE 0.89 08/02/2019   Lab Results  Component Value Date   INSULIN 6.9 06/03/2018   3. Lipedema/lymphedema Tammy [name] uses pumps to help with this.  She has lost 80 pounds, but still has waist to knee fat deposits.  Assessment/Plan:   1. Hypertension associated with diabetes ([place]) Janet is working on healthy weight loss and exercise to improve blood pressure control.  We will watch for signs of hypotension as she continues her lifestyle modifications.  2. Type 2 diabetes mellitus without complication, without long-term current use of insulin (HCC) Good blood sugar control is important to decrease the likelihood of diabetic complications such as nephropathy, neuropathy, limb loss, blindness, coronary artery disease, and death. Intensive lifestyle modification including diet, exercise and weight loss are the first line of treatment for diabetes.   3. Lipedema/lymphedema Will continue to monitor.  4. Class 3 severe obesity with serious comorbidity and body mass index (BMI) of 50.0 to 59.9 in adult, unspecified obesity type (HCC) Tammy [name] is currently in the action stage of change. As such, her goal is to continue with weight loss efforts. She has agreed to the Category 3 Plan.   Exercise goals: As is.  Behavioral modification strategies: increasing lean protein intake and increasing water intake.  Tammy [name] has agreed to follow-up with our clinic in 2 weeks. She was informed of the importance of frequent follow-up visits to maximize her success with intensive lifestyle modifications for her multiple health conditions.   Objective:   Blood pressure 131/77, pulse (!) 53, temperature 98 F (36.7 C), temperature source Oral, height 5\' 5"  (1.651 m), weight (!) 326 lb (147.9 kg), SpO2 100 %. Body mass index is 54.25 kg/m.  General: Cooperative, alert, well developed, in no acute distress. HEENT: Conjunctivae and lids unremarkable. Cardiovascular: Regular rhythm.  Lungs: Normal work of breathing. Neurologic: No focal deficits.   Lab Results  Component  Value Date   CREATININE 0.89 08/02/2019   BUN 12 08/02/2019   NA 139 08/02/2019   K 3.9 08/02/2019   CL 104 08/02/2019   CO2 28 08/02/2019   Lab Results  Component Value Date   ALT 13 08/02/2019   AST 17 08/02/2019   ALKPHOS 67 08/02/2019   BILITOT 0.7 08/02/2019   Lab Results  Component Value Date    HGBA1C 5.8 08/02/2019   HGBA1C 6.0 04/12/2019   HGBA1C 5.9 (H) 10/08/2018   HGBA1C 6.1 (H) 06/03/2018   HGBA1C 6.1 03/10/2018   Lab Results  Component Value Date   INSULIN 6.9 06/03/2018   Lab Results  Component Value Date   TSH 3.13 08/02/2019   Lab Results  Component Value Date   CHOL 195 08/02/2019   HDL 61.10 08/02/2019   LDLCALC 124 (H) 08/02/2019   LDLDIRECT 129.1 10/17/2009   TRIG 50.0 08/02/2019   CHOLHDL 3 08/02/2019   Lab Results  Component Value Date   WBC 2.7 (L) 10/08/2018   HGB 12.6 10/08/2018   HCT 39.1 10/08/2018   MCV 88.1 10/08/2018   PLT 254 10/08/2018   Attestation Statements:   Reviewed by clinician on day of visit: allergies, medications, problem list, medical history, surgical history, family history, social history, and previous encounter notes.  I, Water quality scientist, CMA, am acting as Location manager for [employer], DO.  I have reviewed the above documentation for accuracy and completeness, and I agree with the above. Briscoe Deutscher, DO

## 2019-10-10 ENCOUNTER — Ambulatory Visit (INDEPENDENT_AMBULATORY_CARE_PROVIDER_SITE_OTHER): Payer: 59 | Admitting: Family Medicine

## 2019-10-14 ENCOUNTER — Encounter: Payer: Self-pay | Admitting: Family Medicine

## 2019-10-14 ENCOUNTER — Ambulatory Visit (INDEPENDENT_AMBULATORY_CARE_PROVIDER_SITE_OTHER): Payer: 59 | Admitting: Family Medicine

## 2019-10-14 ENCOUNTER — Other Ambulatory Visit: Payer: Self-pay

## 2019-10-14 VITALS — BP 110/80 | HR 60 | Temp 97.7°F | Resp 18 | Ht 65.0 in | Wt 325.0 lb

## 2019-10-14 DIAGNOSIS — M542 Cervicalgia: Secondary | ICD-10-CM | POA: Diagnosis not present

## 2019-10-14 DIAGNOSIS — M25512 Pain in left shoulder: Secondary | ICD-10-CM | POA: Diagnosis not present

## 2019-10-14 MED ORDER — CYCLOBENZAPRINE HCL 10 MG PO TABS: 10.0000 mg | ORAL_TABLET | Freq: Three times a day (TID) | ORAL | 0 refills | Status: DC | PRN

## 2019-10-14 NOTE — Patient Instructions (Signed)

## 2019-10-14 NOTE — Progress Notes (Signed)
Patient ID: Tammy [name], female    [name]: [birth year]  Age: 59 y.o. MRN: [id number]    Subjective:  Subjective l HPI Tammy [name] presents for L neck pain and shoulder ----c spine xray was done previously and she had some neural foraminal narrowing and deg changes------ pain is getting worse   Review of Systems  Constitutional: Negative for appetite change, diaphoresis, fatigue and unexpected weight change.  Eyes: Negative for pain, redness and visual disturbance.  Respiratory: Negative for cough, chest tightness, shortness of breath and wheezing.   Cardiovascular: Negative for chest pain, palpitations and leg swelling.  Endocrine: Negative for cold intolerance, heat intolerance, polydipsia, polyphagia and polyuria.  Genitourinary: Negative for difficulty urinating, dysuria and frequency.  Musculoskeletal: Positive for arthralgias and myalgias.  Neurological: Negative for dizziness, light-headedness, numbness and headaches.    History Past Medical History:  Diagnosis Date  . Acute on chronic appendicitis s/p lap appy 06/10/2012 06/10/2012   surgery done 06-10-12  . Arthritis    ankle  . Back pain   . Chest pain   . Complication of anesthesia   . Constipation   . Diabetes mellitus    TYPE 2- no meds in several months-was taken off meds -monitoring blood sugar.  Marland Kitchen GERD (gastroesophageal reflux disease)   . History of colon polyps   . Hyperlipidemia   . Hypertension   . Joint pain   . Lactose intolerance   . Morbid obesity - BMI > 50 12/13/2012  . Pneumonia    none recent  . PONV (postoperative nausea and vomiting)   . Poor venous access    "usually difficult stick"  . Prediabetes   . Sinusitis   . SOB (shortness of breath)   . Swelling   . Vitamin D deficiency     She has a past surgical history that includes Abdominal hysterectomy; Knee surgery (Right); Upper gastrointestinal endoscopy (04/14/2006); Colonoscopy (04/11/11); laparoscopic appendectomy (06/10/2012);  Appendectomy; Gastric Roux-En-Y (N/A, 02/27/2015); and Colonoscopy with propofol (N/A, 05/11/2017).   Her family history includes Breast cancer in her paternal aunt and paternal aunt; Cancer (age of onset: 25) in her mother; Diabetes in her father and paternal grandmother; Heart disease in her father; Heart failure in her father; Hyperlipidemia in her brother and mother; Hypertension in her mother.She reports that she has never smoked. She has never used smokeless tobacco. She reports that she does not drink alcohol and does not use drugs.  Current Outpatient Medications on File Prior to Visit  Medication Sig Dispense Refill  . amLODipine (NORVASC) 2.5 MG tablet Take 1 tablet (2.5 mg total) by mouth 2 (two) times daily. 180 tablet 0  . carvedilol (COREG) 6.25 MG tablet Take 1 tablet (6.25 mg total) by mouth 2 (two) times daily with a meal. 180 tablet 3  . furosemide (LASIX) 40 MG tablet Take 1 tablet (40 mg total) by mouth daily. 90 tablet 3  . glucose blood test strip Check blood sugar twice daily 100 each 11  . loratadine (CLARITIN) 10 MG tablet Take 1 tablet (10 mg total) by mouth daily. 90 tablet 3  . Multiple Vitamins-Minerals (MULTIVITAMIN ADULT PO) Take 1 tablet daily by mouth. flintstone vitamins    . NONFORMULARY OR COMPOUNDED ITEM Thigh high compression socks  20-30 mmhg    Re- low ext edema 1 each 1  . pantoprazole (PROTONIX) 40 MG tablet Take 2 tablets (80 mg total) by mouth daily. 180 tablet 3  . Vitamin D, Ergocalciferol, (DRISDOL) 1.25 MG (50000  UNIT) CAPS capsule Take 1 capsule (50,000 Units total) by mouth every 7 (seven) days. 12 capsule 2  . metFORMIN (GLUCOPHAGE) 500 MG tablet Take 1 tablet (500 mg total) by mouth daily with breakfast. 90 tablet 1   No current facility-administered medications on file prior to visit.     Objective:  Objective  Physical Exam Vitals and nursing note reviewed.  Constitutional:      Appearance: She is well-developed.  HENT:     Head:  Normocephalic and atraumatic.  Eyes:     Conjunctiva/sclera: Conjunctivae normal.  Neck:     Thyroid: No thyromegaly.     Vascular: No carotid bruit or JVD.  Cardiovascular:     Rate and Rhythm: Normal rate and regular rhythm.     Heart sounds: Normal heart sounds. No murmur heard.   Pulmonary:     Effort: Pulmonary effort is normal. No respiratory distress.     Breath sounds: Normal breath sounds. No wheezing or rales.  Chest:     Chest wall: No tenderness.  Musculoskeletal:        General: Tenderness present.     Right shoulder: Normal.     Left shoulder: Tenderness present. No bony tenderness or crepitus. Normal range of motion. Normal strength. Normal pulse.       Arms:     Cervical back: Normal range of motion and neck supple.  Neurological:     Mental Status: She is alert and oriented to person, place, and time.    BP 110/80 (BP Location: Left Arm, Patient Position: Sitting, Cuff Size: Large)   Pulse 60   Temp 97.7 F (36.5 C) (Temporal)   Resp 18   Ht 5\' 5"  (1.651 m)   Wt (!) 325 lb (147.4 kg)   SpO2 99%   BMI 54.08 kg/m  Wt Readings from Last 3 Encounters:  10/14/19 (!) 325 lb (147.4 kg)  09/13/19 (!) 326 lb (147.9 kg)  08/10/19 (!) 323 lb (146.5 kg)     Lab Results  Component Value Date   WBC 2.7 (L) 10/08/2018   HGB 12.6 10/08/2018   HCT 39.1 10/08/2018   PLT 254 10/08/2018   GLUCOSE 89 08/02/2019   CHOL 195 08/02/2019   TRIG 50.0 08/02/2019   HDL 61.10 08/02/2019   LDLDIRECT 129.1 10/17/2009   LDLCALC 124 (H) 08/02/2019   ALT 13 08/02/2019   AST 17 08/02/2019   NA 139 08/02/2019   K 3.9 08/02/2019   CL 104 08/02/2019   CREATININE 0.89 08/02/2019   BUN 12 08/02/2019   CO2 28 08/02/2019   TSH 3.13 08/02/2019   INR 1.1 (H) 10/16/2014   HGBA1C 5.8 08/02/2019   MICROALBUR <0.7 04/07/2019    MM DIGITAL SCREENING BILATERAL  Result Date: 06/17/2019 CLINICAL DATA:  Screening. EXAM: DIGITAL SCREENING BILATERAL MAMMOGRAM WITH CAD COMPARISON:   Previous exam(s). ACR Breast Density Category b: There are scattered areas of fibroglandular density. FINDINGS: There are no findings suspicious for malignancy. Images were processed with CAD. IMPRESSION: No mammographic evidence of malignancy. A result letter of this screening mammogram will be mailed directly to the patient. RECOMMENDATION: Screening mammogram in one year. (Code:SM-B-01Y) BI-RADS CATEGORY  1: Negative. Electronically Signed   By: Ammie Ferrier M.D.   On: 06/17/2019 11:30     Assessment & Plan:  Plan  I am having Davinity P. Pates start on cyclobenzaprine. I am also having her maintain her glucose blood, Multiple Vitamins-Minerals (MULTIVITAMIN ADULT PO), NONFORMULARY OR COMPOUNDED ITEM, pantoprazole, furosemide,  loratadine, amLODipine, metFORMIN, carvedilol, and Vitamin D (Ergocalciferol).  Meds ordered this encounter  Medications  . cyclobenzaprine (FLEXERIL) 10 MG tablet    Sig: Take 1 tablet (10 mg total) by mouth 3 (three) times daily as needed for muscle spasms.    Dispense:  30 tablet    Refill:  0    Problem List Items Addressed This Visit      Unprioritized   Neck pain   Relevant Medications   cyclobenzaprine (FLEXERIL) 10 MG tablet   Other Relevant Orders   Ambulatory referral to Orthopedic Surgery    Other Visit Diagnoses    Acute pain of left shoulder    -  Primary   Relevant Medications   cyclobenzaprine (FLEXERIL) 10 MG tablet      Follow-up: Return if symptoms worsen or fail to improve.  [name], DO

## 2019-10-27 ENCOUNTER — Other Ambulatory Visit: Payer: Self-pay | Admitting: Family Medicine

## 2019-10-27 DIAGNOSIS — I10 Essential (primary) hypertension: Secondary | ICD-10-CM

## 2019-11-03 ENCOUNTER — Other Ambulatory Visit: Payer: Self-pay

## 2019-11-03 ENCOUNTER — Encounter: Payer: Self-pay | Admitting: Family Medicine

## 2019-11-03 ENCOUNTER — Ambulatory Visit (INDEPENDENT_AMBULATORY_CARE_PROVIDER_SITE_OTHER): Payer: 59 | Admitting: Family Medicine

## 2019-11-03 DIAGNOSIS — M542 Cervicalgia: Secondary | ICD-10-CM | POA: Diagnosis not present

## 2019-11-03 MED ORDER — VITAMIN D-3 125 MCG (5000 UT) PO TABS: ORAL_TABLET | ORAL | 3 refills | Status: DC

## 2019-11-03 NOTE — Progress Notes (Signed)
Office Visit Note   Patient: Tammy [name]           Date of Birth: 09-Jun-1960           MRN: [id number] Visit Date: 11/03/2019 Requested by: [address] [place], [place] [address] [place],  [place] [address] PCP: Carollee Herter, Alferd Apa, DO  Subjective: Chief Complaint  Patient presents with  . Neck - Pain    Intermittent left-sided neck pain x at least 6 months. NKI. Radiates to top of shoulder, but not down the arm. Her PCP did plain xrays in 04/2019 and ordered an MRI, but the patient said she can't do it due to claustrophobia.    HPI: She is here with neck pain.  Left-sided pain for about 6 months, no injury.  Pain radiating to the top of the shoulder but not down her arm, no numbness or tingling and no weakness.  She does note that she has had some gait instability for for the past 6 months but is not sure whether it is related.  She tried a muscle relaxant but it did not seem to help.  She is right-hand dominant.  She had x-rays but was unable to tolerate MRI scan due to claustrophobia.              ROS: She has diabetes, vitamin D deficiency, GERD.  All other systems were reviewed and are negative.  Objective: Vital Signs: There were no vitals taken for this visit.  Physical Exam:  General:  Alert and oriented, in no acute distress. Pulm:  Breathing unlabored. Psy:  Normal mood, congruent affect. Skin: No rash Neck: She has full range of motion with pain at the extreme of rotation and side bending to the left.  Spurling's test negative.  Upper extremity strength and reflexes are normal.  Full range of motion of the shoulder.  She has tender trigger points in the left rhomboid area,, the left trapezius belly, and to palpation of the left-sided cervical paraspinous muscles.    Imaging: None today, but previous x-rays were reviewed showing mild degenerative disc disease at multiple levels and moderate at C6-7.  She has facet DJD and uncovertebral DJD at  multiple levels.  No sign of neoplasm.   Assessment & Plan: 1. chronic left-sided neck pain, with underlying spondylosis.  Neurologic exam is nonfocal. -Elected to try physical therapy in [facility]  She did not want any new medications.  We will more aggressively treat her vitamin D deficiency as well, since this could contribute to her pain. -If she fails to improve, could contemplate a brief trial of chiropractic therapy prior to obtaining an open MRI scan with Valium for sedation.     Procedures: No procedures performed  No notes on file     PMFS History: Patient Active Problem List   Diagnosis Date Noted  . Hyperlipidemia associated with type 2 diabetes mellitus ([place]) 04/07/2019  . Uncontrolled type 2 diabetes mellitus with hyperglycemia ([place]) 04/07/2019  . Neck pain 04/07/2019  . Chronic seasonal allergic rhinitis due to pollen 04/07/2019  . Lymphedema 03/04/2019  . Edema due to malnutrition ([place]) 10/10/2018  . Class 3 severe obesity with serious comorbidity and body mass index (BMI) of 50.0 to 59.9 in adult ([place]) 08/30/2018  . Lower extremity edema 07/04/2015  . Hoarseness 06/15/2015  . Bronchospasm with bronchitis, acute 05/24/2015  . Elevated CA-125 03/16/2015  . 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 02/27/2015  .  Enteritis 01/25/2015  . Ascites 01/25/2015  . Abdominal pain 01/22/2015  . Ovarian cyst, left 01/03/2015  . Allergic rhinitis 12/21/2013  . Right ankle pain 11/10/2013  . Morbid obesity - BMI > 50 12/13/2012  . Chronic vomiting 12/13/2012  . Lactose intolerance 10/22/2012  . Left knee pain 07/26/2012  . Bacterial vaginosis 06/10/2012  . Bradycardia, sinus 03/28/2012  . Central positional vertigo 03/27/2012  . Tongue swelling 08/21/2011  . Urgency of urination 05/14/2011  . Personal history of colonic polyps 04/11/2011  . Otalgia 11/14/2010  . OTHER TENOSYNOVITIS OF HAND AND WRIST 07/19/2010  . DYSPEPSIA 10/17/2009  . BACK PAIN, LUMBAR, WITH  RADICULOPATHY 12/27/2008  . VITAMIN B12 DEFICIENCY 09/29/2008  . SYNCOPE 08/21/2008  . ANKLE EDEMA, CHRONIC 12/16/2006  . DM (diabetes mellitus) type II, controlled, with peripheral vascular disorder ([place]) 11/09/2006  . Hyperlipidemia LDL goal <70 11/09/2006  . Essential hypertension 05/26/2006  . GERD 05/26/2006   Past Medical History:  Diagnosis Date  . Acute on chronic appendicitis s/p lap appy 06/10/2012 06/10/2012   surgery done 06-10-12  . Arthritis    ankle  . Back pain   . Chest pain   . Complication of anesthesia   . Constipation   . Diabetes mellitus    TYPE 2- no meds in several months-was taken off meds -monitoring blood sugar.  Marland Kitchen GERD (gastroesophageal reflux disease)   . History of colon polyps   . Hyperlipidemia   . Hypertension   . Joint pain   . Lactose intolerance   . Morbid obesity - BMI > 50 12/13/2012  . Pneumonia    none recent  . PONV (postoperative nausea and vomiting)   . Poor venous access    "usually difficult stick"  . Prediabetes   . Sinusitis   . SOB (shortness of breath)   . Swelling   . Vitamin D deficiency     Family History  Problem Relation Age of Onset  . Hyperlipidemia Mother   . Hypertension Mother   . Cancer Mother 20       hodgkins lymphoma  . Diabetes Father   . Heart failure Father   . Heart disease Father   . Hyperlipidemia Brother   . Diabetes Paternal Grandmother   . Breast cancer Paternal Aunt        unsure of age  . Breast cancer Paternal Aunt   . Colon cancer Neg Hx   . Heart attack Neg Hx   . Sudden death Neg Hx   . Colon polyps Neg Hx   . Esophageal cancer Neg Hx   . Stomach cancer Neg Hx   . Rectal cancer Neg Hx     Past Surgical History:  Procedure Laterality Date  . ABDOMINAL HYSTERECTOMY    . APPENDECTOMY     2'14  . COLONOSCOPY  04/11/11   5 mm polyp removed but not recovered  . COLONOSCOPY WITH PROPOFOL N/A 05/11/2017   Procedure: COLONOSCOPY WITH PROPOFOL;  Surgeon: Gatha Mayer, MD;  Location: [facility]  ENDOSCOPY;  Service: Endoscopy;  Laterality: N/A;  . GASTRIC ROUX-EN-Y N/A 02/27/2015   Procedure: LAPAROSCOPIC ROUX-EN-Y GASTRIC BYPASS WITH UPPER ENDOSCOPY;  Surgeon: Johnathan Hausen, MD;  Location: [facility] ORS;  Service: General;  Laterality: N/A;  . KNEE SURGERY Right     KNEE SURGERY   . LAPAROSCOPIC APPENDECTOMY  06/10/2012   Procedure: APPENDECTOMY LAPAROSCOPIC;  Surgeon: Adin Hector, MD;  Location: [facility] ORS;  Service: General;  Laterality: N/A;  . UPPER GASTROINTESTINAL  ENDOSCOPY  04/14/2006   Normal   Social History   Occupational History  . 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[employer]  Tobacco Use  . Smoking status: Never Smoker  . Smokeless tobacco: Never Used  Vaping Use  . Vaping Use: Never used  Substance and Sexual Activity  . Alcohol use: No    Alcohol/week: 0.0 standard drinks  . Drug use: No  . Sexual activity: Yes    Partners: Male

## 2019-11-07 ENCOUNTER — Emergency Department (HOSPITAL_BASED_OUTPATIENT_CLINIC_OR_DEPARTMENT_OTHER)
Admission: EM | Admit: 2019-11-07 | Discharge: 2019-11-07 | Disposition: A | Discharge status: Home / Self Care | Payer: 59 | Attending: Emergency Medicine | Admitting: Emergency Medicine

## 2019-11-07 ENCOUNTER — Other Ambulatory Visit: Payer: Self-pay

## 2019-11-07 ENCOUNTER — Encounter (HOSPITAL_BASED_OUTPATIENT_CLINIC_OR_DEPARTMENT_OTHER): Payer: Self-pay

## 2019-11-07 DIAGNOSIS — E1151 Type 2 diabetes mellitus with diabetic peripheral angiopathy without gangrene: Secondary | ICD-10-CM | POA: Insufficient documentation

## 2019-11-07 DIAGNOSIS — E1165 Type 2 diabetes mellitus with hyperglycemia: Secondary | ICD-10-CM | POA: Insufficient documentation

## 2019-11-07 DIAGNOSIS — Z79899 Other long term (current) drug therapy: Secondary | ICD-10-CM | POA: Diagnosis not present

## 2019-11-07 DIAGNOSIS — R030 Elevated blood-pressure reading, without diagnosis of hypertension: Secondary | ICD-10-CM | POA: Diagnosis present

## 2019-11-07 DIAGNOSIS — Z7984 Long term (current) use of oral hypoglycemic drugs: Secondary | ICD-10-CM | POA: Insufficient documentation

## 2019-11-07 DIAGNOSIS — I10 Essential (primary) hypertension: Secondary | ICD-10-CM

## 2019-11-07 NOTE — Discharge Instructions (Signed)
You were seen in the ER for elevated blood pressure reading  Blood pressure has improved here  You did not have any symptoms that would suggest high blood pressure crisis like severe sudden headache, stroke symptoms, chest pain, shortness of breath, swelling in your body  Try to take your blood pressures daily without missing any doses.  Make sure you are checking your blood pressure at home appropriately.  We discussed appropriate size of the cuff, sitting down for 5 minutes before taking blood pressure.  Follow-up with your primary care doctor in the next week or 2 if your blood pressure is elevated and persistently higher than 140

## 2019-11-07 NOTE — ED Provider Notes (Signed)
[facility] EMERGENCY DEPARTMENT Provider Note   CSN: [id number] Arrival date & time: 11/07/19  1225     History Chief Complaint  Patient presents with  . Hypertension    Tammy [name] is a 59 y.o. female presents to the ER for evaluation of elevated blood pressure reading when she woke up this morning. Patient checks her BP frequently at home.  Yesterday her SBP was 190s.  This morning she woke up and it was high 180s.  She states she had a "heaviness" in the back of her head this morning.  States this is not unusual for her and she usually gets headache similar to this from sinuses.  She used flonase and took her blood pressure medicine and came to the ED.  Head heaviness has resolved. No headache. Admits to missing at least 1 one her BP medicines recently.  Admits to recent increase in work and overtime, stress, poor sleep. States her cuff at home is a size adult large.  Patient states her BP has improved since taking her blood pressure medicine this morning and on arrival to ER SBP is 160s.  Denies sudden severe headache, visual disturbances, difficulty with speech or balance, one sided weakness or numbness. No chest pain or shortness of breath. No leg swelling. No anticoagulants.   HPI     Past Medical History:  Diagnosis Date  . Acute on chronic appendicitis s/p lap appy 06/10/2012 06/10/2012   surgery done 06-10-12  . Arthritis    ankle  . Back pain   . Chest pain   . Complication of anesthesia   . Constipation   . Diabetes mellitus    TYPE 2- no meds in several months-was taken off meds -monitoring blood sugar.  Marland Kitchen GERD (gastroesophageal reflux disease)   . History of colon polyps   . Hyperlipidemia   . Hypertension   . Joint pain   . Lactose intolerance   . Morbid obesity - BMI > 50 12/13/2012  . Pneumonia    none recent  . PONV (postoperative nausea and vomiting)   . Poor venous access    "usually difficult stick"  . Prediabetes   . Sinusitis   . SOB  (shortness of breath)   . Swelling   . Vitamin D deficiency     Patient Active Problem List   Diagnosis Date Noted  . Hyperlipidemia associated with type 2 diabetes mellitus ([place]) 04/07/2019  . Uncontrolled type 2 diabetes mellitus with hyperglycemia ([place]) 04/07/2019  . Neck pain 04/07/2019  . Chronic seasonal allergic rhinitis due to pollen 04/07/2019  . Lymphedema 03/04/2019  . Edema due to malnutrition ([place]) 10/10/2018  . Class 3 severe obesity with serious comorbidity and body mass index (BMI) of 50.0 to 59.9 in adult ([place]) 08/30/2018  . Lower extremity edema 07/04/2015  . Hoarseness 06/15/2015  . Bronchospasm with bronchitis, acute 05/24/2015  . Elevated CA-125 03/16/2015  . 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 02/27/2015  . Enteritis 01/25/2015  . Ascites 01/25/2015  . Abdominal pain 01/22/2015  . Ovarian cyst, left 01/03/2015  . Allergic rhinitis 12/21/2013  . Right ankle pain 11/10/2013  . Morbid obesity - BMI > 50 12/13/2012  . Chronic vomiting 12/13/2012  . Lactose intolerance 10/22/2012  . Left knee pain 07/26/2012  . Bacterial vaginosis 06/10/2012  . Bradycardia, sinus 03/28/2012  . Central positional vertigo 03/27/2012  . Tongue swelling 08/21/2011  . Urgency of urination 05/14/2011  . Personal history of colonic polyps 04/11/2011  .  Otalgia 11/14/2010  . OTHER TENOSYNOVITIS OF HAND AND WRIST 07/19/2010  . DYSPEPSIA 10/17/2009  . BACK PAIN, LUMBAR, WITH RADICULOPATHY 12/27/2008  . VITAMIN B12 DEFICIENCY 09/29/2008  . SYNCOPE 08/21/2008  . ANKLE EDEMA, CHRONIC 12/16/2006  . DM (diabetes mellitus) type II, controlled, with peripheral vascular disorder ([place]) 11/09/2006  . Hyperlipidemia LDL goal <70 11/09/2006  . Essential hypertension 05/26/2006  . GERD 05/26/2006    Past Surgical History:  Procedure Laterality Date  . ABDOMINAL HYSTERECTOMY    . APPENDECTOMY     2'14  . COLONOSCOPY  04/11/11   5 mm polyp removed but not recovered  . COLONOSCOPY WITH  PROPOFOL N/A 05/11/2017   Procedure: COLONOSCOPY WITH PROPOFOL;  Surgeon: Gatha Mayer, MD;  Location: [facility];  Service: Endoscopy;  Laterality: N/A;  . GASTRIC ROUX-EN-Y N/A 02/27/2015   Procedure: LAPAROSCOPIC ROUX-EN-Y GASTRIC BYPASS WITH UPPER ENDOSCOPY;  Surgeon: Johnathan Hausen, MD;  Location: [facility];  Service: General;  Laterality: N/A;  . KNEE SURGERY Right     KNEE SURGERY   . LAPAROSCOPIC APPENDECTOMY  06/10/2012   Procedure: APPENDECTOMY LAPAROSCOPIC;  Surgeon: Adin Hector, MD;  Location: [facility];  Service: General;  Laterality: N/A;  . UPPER GASTROINTESTINAL ENDOSCOPY  04/14/2006   Normal     OB History    Gravida  1   Para  1   Term      Preterm      AB      Living        SAB      TAB      Ectopic      Multiple      Live Births              Family History  Problem Relation Age of Onset  . Hyperlipidemia Mother   . Hypertension Mother   . Cancer Mother 30       hodgkins lymphoma  . Diabetes Father   . Heart failure Father   . Heart disease Father   . Hyperlipidemia Brother   . Diabetes Paternal Grandmother   . Breast cancer Paternal Aunt        unsure of age  . Breast cancer Paternal Aunt   . Colon cancer Neg Hx   . Heart attack Neg Hx   . Sudden death Neg Hx   . Colon polyps Neg Hx   . Esophageal cancer Neg Hx   . Stomach cancer Neg Hx   . Rectal cancer Neg Hx     Social History   Tobacco Use  . Smoking status: Never Smoker  . Smokeless tobacco: Never Used  Vaping Use  . Vaping Use: Never used  Substance Use Topics  . Alcohol use: No    Alcohol/week: 0.0 standard drinks  . Drug use: No    Home Medications Prior to Admission medications   Medication Sig Start Date End Date Taking? Authorizing Provider  amLODipine (NORVASC) 2.5 MG tablet TAKE 1 TABLET BY MOUTH  TWICE DAILY 10/27/19   Colon Branch, MD  carvedilol (COREG) 6.25 MG tablet Take 1 tablet (6.25 mg total) by mouth 2 (two) times daily with a meal. 08/02/19   Carollee Herter, Alferd Apa, DO  Cholecalciferol (VITAMIN D-3) 125 MCG (5000 UT) TABS 2 PO qd x 3 months, then 1 PO qd after that 11/03/19   Hilts, Michael, MD  cyclobenzaprine (FLEXERIL) 10 MG tablet Take 1 tablet (10 mg total) by mouth 3 (three) times daily as  needed for muscle spasms. 10/14/19   Roma Schanz R, DO  fluticasone (FLONASE) 50 MCG/ACT nasal spray Place into both nostrils. 10/20/19   [provider]  furosemide (LASIX) 40 MG tablet Take 1 tablet (40 mg total) by mouth daily. 03/04/19   Roma Schanz R, DO  glucose blood test strip Check blood sugar twice daily 11/28/16   Carollee Herter, Alferd Apa, DO  loratadine (CLARITIN) 10 MG tablet Take 1 tablet (10 mg total) by mouth daily. 04/07/19   [name], DO  metFORMIN (GLUCOPHAGE) 500 MG tablet Take 1 tablet (500 mg total) by mouth daily with breakfast. 08/02/19 09/01/19  Carollee Herter, Alferd Apa, DO  Multiple Vitamins-Minerals (MULTIVITAMIN ADULT PO) Take 1 tablet daily by mouth. flintstone vitamins    [provider]  NONFORMULARY OR COMPOUNDED ITEM Thigh high compression socks  20-30 mmhg    Re- low ext edema 03/02/18   Carollee Herter, Alferd Apa, DO  pantoprazole (PROTONIX) 40 MG tablet Take 2 tablets (80 mg total) by mouth daily. 03/04/19   [name], DO  Vitamin D, Ergocalciferol, (DRISDOL) 1.25 MG (50000 UNIT) CAPS capsule Take 1 capsule (50,000 Units total) by mouth every 7 (seven) days. 08/03/19   [name], DO    Allergies    Ace inhibitors, Cheese, Red dye, and Tuna [fish allergy]  Review of Systems   Review of Systems  All other systems reviewed and are negative.   Physical Exam Updated Vital Signs BP (!) 157/78 (BP Location: Left Arm)   Pulse (!) 52   Temp 98.1 F (36.7 C) (Oral)   Resp 16   Ht 5\' 6"  (1.676 m)   Wt (!) 138.3 kg   SpO2 100%   BMI 49.23 kg/m   Physical Exam Constitutional:      Appearance: She is well-developed.  HENT:     Head: Normocephalic.     Nose:  Nose normal.  Eyes:     General: Lids are normal.  Cardiovascular:     Rate and Rhythm: Normal rate and regular rhythm.  Pulmonary:     Effort: Pulmonary effort is normal.     Breath sounds: Normal breath sounds.  Musculoskeletal:        General: Normal range of motion.     Cervical back: Normal range of motion.  Neurological:     Mental Status: She is alert.  Psychiatric:        Behavior: Behavior normal.     ED Results / Procedures / Treatments   Labs (all labs ordered are listed, but only abnormal results are displayed) Labs Reviewed - No data to display  EKG None  Radiology No results found.  Procedures Procedures (including critical care time)  Medications Ordered in ED Medications - No data to display  ED Course  I have reviewed the triage vital signs and the nursing notes.  Pertinent labs & imaging results that were available during my care of the patient were reviewed by me and considered in my medical decision making (see chart for details).    MDM Rules/Calculators/A&P                          59 y.o. yo female with elevated BP readings in setting of known HTN. Reported BP ranging SBP 180-190s at home, range in ER has been SBP 150-160s. Has "heaviness" in the back of her head that was mild when waking up this  morning. Usually gets headaches due to sinuses.  She took flonase and BP meds and now symptom free.  Missed recent BP meds this week. No clinical features to suggest end organ dysfunction like severe or thunderclap HA, seizures, stroke symptoms, vision changes, CP, SOB, cough, orthopnea, LE edema.  Pt currently asymptomatic. No further indication for aggressive BP control or further emergent work up.  Will dc with f/u with PCP within one week for recheck of BP, may need med adjustment.  Final Clinical Impression(s) / ED Diagnoses Final diagnoses:  Elevated blood pressure reading with diagnosis of hypertension    Rx / DC Orders ED Discharge Orders     None       Kinnie Feil, PA-C 11/07/19 1544    Davonna Belling, MD 11/08/19 [phone]

## 2019-11-07 NOTE — ED Triage Notes (Signed)
Pt c/o elevated BP x 2 days-states she missed one day dose either 7/3 or 7/4-NAD-steady gait

## 2019-11-16 ENCOUNTER — Other Ambulatory Visit: Payer: Self-pay

## 2019-11-16 ENCOUNTER — Ambulatory Visit: Payer: 59 | Attending: Family Medicine | Admitting: Physical Therapy

## 2019-11-16 ENCOUNTER — Encounter: Payer: Self-pay | Admitting: Physical Therapy

## 2019-11-16 DIAGNOSIS — M6281 Muscle weakness (generalized): Secondary | ICD-10-CM | POA: Diagnosis present

## 2019-11-16 DIAGNOSIS — M542 Cervicalgia: Secondary | ICD-10-CM | POA: Insufficient documentation

## 2019-11-16 DIAGNOSIS — R293 Abnormal posture: Secondary | ICD-10-CM | POA: Diagnosis present

## 2019-11-16 DIAGNOSIS — M62838 Other muscle spasm: Secondary | ICD-10-CM | POA: Insufficient documentation

## 2019-11-16 NOTE — Patient Instructions (Signed)
    Home exercise program created by Chaske Paskett, PT.  For questions, please contact Amadou Katzenstein via phone at [phone] or email at [email or web address]   [facility] [place] [address]  [address] [place], [place], [address] Phone: [phone]   Fax:  [phone]    

## 2019-11-16 NOTE — Therapy (Signed)
[facility] [place] [address]  [place], [place], [address] Phone: [phone]   Fax:  [phone]  Physical Therapy Evaluation  Patient Details  Name: Tammy [name] MRN: [id number] Date of Birth: 1960/09/22 Referring Provider (PT): Eunice Blase, MD   Encounter Date: 11/16/2019   PT End of Session - 11/16/19 1020    Visit Number 1    Number of Visits 12    Date for PT Re-Evaluation 12/28/19    Authorization Type UHC    PT Start Time 1020    PT Stop Time 1111    PT Time Calculation (min) 51 min    Activity Tolerance Patient tolerated treatment well    Behavior During Therapy [facility] for tasks assessed/performed           Past Medical History:  Diagnosis Date  . Acute on chronic appendicitis s/p lap appy 06/10/2012 06/10/2012   surgery done 06-10-12  . Arthritis    ankle  . Back pain   . Chest pain   . Complication of anesthesia   . Constipation   . Diabetes mellitus    TYPE 2- no meds in several months-was taken off meds -monitoring blood sugar.  Marland Kitchen GERD (gastroesophageal reflux disease)   . History of colon polyps   . Hyperlipidemia   . Hypertension   . Joint pain   . Lactose intolerance   . Morbid obesity - BMI > 50 12/13/2012  . Pneumonia    none recent  . PONV (postoperative nausea and vomiting)   . Poor venous access    "usually difficult stick"  . Prediabetes   . Sinusitis   . SOB (shortness of breath)   . Swelling   . Vitamin D deficiency     Past Surgical History:  Procedure Laterality Date  . ABDOMINAL HYSTERECTOMY    . APPENDECTOMY     2'14  . COLONOSCOPY  04/11/11   5 mm polyp removed but not recovered  . COLONOSCOPY WITH PROPOFOL N/A 05/11/2017   Procedure: COLONOSCOPY WITH PROPOFOL;  Surgeon: Gatha Mayer, MD;  Location: [facility];  Service: Endoscopy;  Laterality: N/A;  . GASTRIC ROUX-EN-Y N/A 02/27/2015   Procedure: LAPAROSCOPIC ROUX-EN-Y GASTRIC BYPASS WITH UPPER ENDOSCOPY;   Surgeon: Johnathan Hausen, MD;  Location: [facility];  Service: General;  Laterality: N/A;  . KNEE SURGERY Right     KNEE SURGERY   . LAPAROSCOPIC APPENDECTOMY  06/10/2012   Procedure: APPENDECTOMY LAPAROSCOPIC;  Surgeon: Adin Hector, MD;  Location: [facility];  Service: General;  Laterality: N/A;  . UPPER GASTROINTESTINAL ENDOSCOPY  04/14/2006   Normal    There were no vitals filed for this visit.    Subjective Assessment - 11/16/19 1024    Subjective Pt reports initial onset onset of pain in Dec 2020. Thought it may have been related to carrying her pocketbook but changing side and size of pocketbook did not give lasting relief of pain. Has had return of intermittent L sided neck pain for past 2-3 months with sensitivity to touch along L jaw line.    Limitations Lifting    Diagnostic tests 04/12/19 cervical x-ray: 1. No acute osseous abnormality of the cervical spine.2. Multilevel degenerative disc disease with maximal intervertebraldisc height loss at C6-7 and with uncinate spurring facet hypertrophy resulting mild to moderate bilateral neural foraminal narrowing.    Patient Stated Goals "to balance myself where I don't have to use just one side"    Currently in  Pain? Yes    Pain Score 4    3-4/10   Pain Location Neck   & upper shoulder   Pain Orientation Left    Pain Descriptors / Indicators Other (Comment)   "pulling"   Pain Type Acute pain    Pain Radiating Towards L upper shoulder    Pain Onset More than a month ago    Pain Frequency Intermittent    Aggravating Factors  random or sudden movement of neck, pressure on neck or jaw such as when having jaw waxed    Pain Relieving Factors OTC topical pain relief    Effect of Pain on Daily Activities try to carry things on R side              [facility] PT Assessment - 11/16/19 1020      Assessment   Medical Diagnosis L neck pain    Referring Provider (PT) Eunice Blase, MD    Onset Date/Surgical Date --   Dec 2020   Hand Dominance Right      Next MD Visit TBD    Prior Therapy none for current problem      Precautions   Precautions None      Balance Screen   Has the patient fallen in the past 6 months No    Has the patient had a decrease in activity level because of a fear of falling?  Yes    Is the patient reluctant to leave their home because of a fear of falling?  No      Home Environment   Living Environment Private residence    Living Arrangements Spouse/significant other    Type of [place]      Prior Function   Level of [place] Full time employment   + overtime   Vocation Requirements microchip inspection using microscope    Leisure cleaning house, volunteer at church, walking ~1 mile per week      Cognition   Overall Cognitive Status Within Functional Limits for tasks assessed      Observation/Other Assessments   Focus on Therapeutic Outcomes (FOTO)  Neck - 59% (41% limitation); Predicted 68% (32% predicted)      Posture/Postural Control   Posture/Postural Control Postural limitations    Postural Limitations Forward head;Rounded Shoulders    Posture Comments L shoulder elevated & protracted      ROM / Strength   AROM / PROM / Strength AROM;Strength      AROM   AROM Assessment Site Cervical    Cervical Flexion 55    Cervical Extension 37    Cervical - Right Side Bend 29    Cervical - Left Side Bend 32    Cervical - Right Rotation 61    Cervical - Left Rotation 65      Strength   Strength Assessment Site Shoulder    Right/Left Shoulder Right;Left    Right Shoulder Flexion 4-/5    Right Shoulder ABduction 4/5    Right Shoulder Internal Rotation 4/5    Right Shoulder External Rotation 4-/5    Left Shoulder Flexion 4-/5    Left Shoulder ABduction 4/5    Left Shoulder Internal Rotation 4-/5    Left Shoulder External Rotation 4-/5      Palpation   Palpation comment increased muscle tension with ttp in L SCM, UT, LS & posterior shoulder as well as B pecs (L>R)  Objective measurements completed on examination: See above findings.       [facility] Adult PT Treatment/Exercise - 11/16/19 1020      Exercises   Exercises Neck      Neck Exercises: Seated   Neck Retraction 5 reps;5 secs    Neck Retraction Limitations fingertip guidance    Other Seated Exercise B scap retraction & depression      Neck Exercises: Supine   Neck Retraction 5 reps;5 secs    Neck Retraction Limitations into pillow      Neck Exercises: Stretches   Upper Trapezius Stretch Left;30 seconds;1 rep    Levator Stretch Left;30 seconds;1 rep    Neck Stretch 30 seconds;1 rep    Neck Stretch Limitations L SCM stretch    Corner Stretch Limitations 3-way doorway pec stretch                  PT Education - 11/16/19 1111    Education Details PT eval findings, anticipated POC, preliminary postural education & initial HEP    Person(s) Educated Patient    Methods Explanation;Demonstration;Verbal cues;Handout    Comprehension Verbalized understanding;Returned demonstration;Verbal cues required;Need further instruction            PT Short Term Goals - 11/16/19 1111      PT SHORT TERM GOAL #1   Title Patient will be independent with initial HEP    Status New    Target Date 12/07/19      PT SHORT TERM GOAL #2   Title Patient will verbalize/demonstrate good awareness of neutral spine and shoulder posture and proper body mechanics for daily tasks    Status New    Target Date 12/07/19             PT Long Term Goals - 11/16/19 1111      PT LONG TERM GOAL #1   Title Patient will be independent with ongoing/advanced HEP    Status New    Target Date 12/28/19      PT LONG TERM GOAL #2   Title Patient to demonstrate appropriate posture and body mechanics needed for daily activities    Status New    Target Date 12/28/19      PT LONG TERM GOAL #3   Title Patient to demonstrate improved tissue quality and pliability with reduced pain     Status New    Target Date 12/28/19      PT LONG TERM GOAL #4   Title Patient to improve cervical AROM to WNL without pain provocation    Status New    Target Date 12/28/19      PT LONG TERM GOAL #5   Title Patient will demonstrate improved B shoulder strength to >/= 4+/5 for functional UE use    Status New    Target Date 12/28/19      PT LONG TERM GOAL #6   Title Patient to report ability to perform work and daily activities without pain provocation    Status New    Target Date 12/28/19                  Plan - 11/16/19 1111    Clinical Impression Statement Tammy [name] is a 59 y/o female who presents to OP for acute/chronic L sided neck pain attributed to C6-7 DDD with myofascial pain. Pain originating ~Dec 2020 and worsening over past 2-3 months. She originally attributed pain to carrying a heavy pocketbook on the L but was unable to get lasting  relief from changing sides and/or switching to a smaller bag. She demonstrates a forward head and shoulder posture with L shoulder elevated and B shoulders protracted - pt attributes some of postural deficits to working regularly with a microscope at work. Other deficits include mildly limited cervical ROM, increased muscle tension and ttp in L lateral neck and upper shoulder musculature and decreased shoulder strength with pain upon MMT resistance. Pain causes her to avoid carrying things on her L and limits how much she can lift. Tammy [name] will benefit from skilled PT to address above deficits, promote neutral spinal and shoulder posture and restore functional cervical ROM with decreased pain and tightness to decrease pain interference with daily household, work and recreational activities.    Personal Factors and Comorbidities Comorbidity 3+;Time since onset of injury/illness/exacerbation;Past/Current Experience;Profession    Comorbidities OA, HTN, DM-II, BMI >50, GERD, HLD, R knee surgery    Examination-Activity Limitations Carry;Lift     Examination-Participation Restrictions Church;Volunteer;Cleaning;Shop;Yard Work    Stability/Clinical Decision Making Stable/Uncomplicated    Clinical Decision Making Low    Rehab Potential Good    PT Frequency 2x / week   1-2x/wk as work schedule allows   PT Duration 6 weeks    PT Treatment/Interventions ADLs/Self Care Home Management;Cryotherapy;Electrical Stimulation;Iontophoresis 4mg /ml Dexamethasone;Moist Heat;Traction;Ultrasound;Functional mobility training;Therapeutic activities;Therapeutic exercise;Neuromuscular re-education;Patient/family education;Manual techniques;Passive range of motion;Dry needling;Taping;Spinal Manipulations    PT Next Visit Plan Review initial HEP; manual therapy including potential DN for increased muscle tension/tightness; postural strengthening; posture & body mechanics education; modalities PRN    PT Home Exercise Plan 7/14 - chin tuck, UT, LS, SCM & doorway pec stretches, scap retraction    Consulted and Agree with Plan of Care Patient           Patient will benefit from skilled therapeutic intervention in order to improve the following deficits and impairments:  Decreased activity tolerance, Decreased range of motion, Decreased strength, Increased fascial restricitons, Increased muscle spasms, Impaired perceived functional ability, Impaired flexibility, Impaired UE functional use, Improper body mechanics, Postural dysfunction, Pain  Visit Diagnosis: Cervicalgia  Other muscle spasm  Abnormal posture  Muscle weakness (generalized)     Problem List Patient Active Problem List   Diagnosis Date Noted  . Hyperlipidemia associated with type 2 diabetes mellitus ([place]) 04/07/2019  . Uncontrolled type 2 diabetes mellitus with hyperglycemia ([place]) 04/07/2019  . Neck pain 04/07/2019  . Chronic seasonal allergic rhinitis due to pollen 04/07/2019  . Lymphedema 03/04/2019  . Edema due to malnutrition ([place]) 10/10/2018  . Class 3 severe obesity with serious  comorbidity and body mass index (BMI) of 50.0 to 59.9 in adult ([place]) 08/30/2018  . Lower extremity edema 07/04/2015  . Hoarseness 06/15/2015  . Bronchospasm with bronchitis, acute 05/24/2015  . Elevated CA-125 03/16/2015  . 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 02/27/2015  . Enteritis 01/25/2015  . Ascites 01/25/2015  . Abdominal pain 01/22/2015  . Ovarian cyst, left 01/03/2015  . Allergic rhinitis 12/21/2013  . Right ankle pain 11/10/2013  . Morbid obesity - BMI > 50 12/13/2012  . Chronic vomiting 12/13/2012  . Lactose intolerance 10/22/2012  . Left knee pain 07/26/2012  . Bacterial vaginosis 06/10/2012  . Bradycardia, sinus 03/28/2012  . Central positional vertigo 03/27/2012  . Tongue swelling 08/21/2011  . Urgency of urination 05/14/2011  . Personal history of colonic polyps 04/11/2011  . Otalgia 11/14/2010  . OTHER TENOSYNOVITIS OF HAND AND WRIST 07/19/2010  . DYSPEPSIA 10/17/2009  . BACK PAIN, LUMBAR, WITH RADICULOPATHY 12/27/2008  .  VITAMIN B12 DEFICIENCY 09/29/2008  . SYNCOPE 08/21/2008  . ANKLE EDEMA, CHRONIC 12/16/2006  . DM (diabetes mellitus) type II, controlled, with peripheral vascular disorder ([place]) 11/09/2006  . Hyperlipidemia LDL goal <70 11/09/2006  . Essential hypertension 05/26/2006  . GERD 05/26/2006    Percival Spanish, PT, MPT 11/16/2019, 12:14 PM  [facility] [address]  [place], [place], [address] Phone: [phone]   Fax:  [phone]  Name: Tammy [name] MRN: [id number] Date of Birth: 04-01-1961

## 2019-11-17 ENCOUNTER — Ambulatory Visit (INDEPENDENT_AMBULATORY_CARE_PROVIDER_SITE_OTHER): Payer: 59 | Admitting: Family Medicine

## 2019-11-17 ENCOUNTER — Encounter: Payer: Self-pay | Admitting: Family Medicine

## 2019-11-17 VITALS — BP 118/78 | HR 61 | Temp 98.2°F | Resp 18 | Ht 66.0 in | Wt 303.2 lb

## 2019-11-17 DIAGNOSIS — I10 Essential (primary) hypertension: Secondary | ICD-10-CM | POA: Diagnosis not present

## 2019-11-17 NOTE — Assessment & Plan Note (Signed)
Well controlled, no changes to meds. Encouraged heart healthy diet such as the DASH diet and exercise as tolerated.  °

## 2019-11-17 NOTE — Patient Instructions (Signed)
DASH Eating Plan DASH stands for "Dietary Approaches to Stop Hypertension." The DASH eating plan is a healthy eating plan that has been shown to reduce high blood pressure (hypertension). It may also reduce your risk for type 2 diabetes, heart disease, and stroke. The DASH eating plan may also help with weight loss. What are tips for following this plan?  General guidelines  Avoid eating more than 2,300 mg (milligrams) of salt (sodium) a day. If you have hypertension, you may need to reduce your sodium intake to 1,500 mg a day.  Limit alcohol intake to no more than 1 drink a day for nonpregnant women and 2 drinks a day for men. One drink equals 12 oz of beer, 5 oz of wine, or 1 oz of hard liquor.  Work with your health care provider to maintain a healthy body weight or to lose weight. Ask what an ideal weight is for you.  Get at least 30 minutes of exercise that causes your heart to beat faster (aerobic exercise) most days of the week. Activities may include walking, swimming, or biking.  Work with your health care provider or diet and nutrition specialist (dietitian) to adjust your eating plan to your individual calorie needs. Reading food labels   Check food labels for the amount of sodium per serving. Choose foods with less than 5 percent of the Daily Value of sodium. Generally, foods with less than 300 mg of sodium per serving fit into this eating plan.  To find whole grains, look for the word "whole" as the first word in the ingredient list. Shopping  Buy products labeled as "low-sodium" or "no salt added."  Buy fresh foods. Avoid canned foods and premade or frozen meals. Cooking  Avoid adding salt when cooking. Use salt-free seasonings or herbs instead of table salt or sea salt. Check with your health care provider or pharmacist before using salt substitutes.  Do not fry foods. Cook foods using healthy methods such as baking, boiling, grilling, and broiling instead.  Cook with  heart-healthy oils, such as olive, canola, soybean, or sunflower oil. Meal planning  Eat a balanced diet that includes: ? 5 or more servings of fruits and vegetables each day. At each meal, try to fill half of your plate with fruits and vegetables. ? Up to 6-8 servings of whole grains each day. ? Less than 6 oz of lean meat, poultry, or fish each day. A 3-oz serving of meat is about the same size as a deck of cards. One egg equals 1 oz. ? 2 servings of low-fat dairy each day. ? A serving of nuts, seeds, or beans 5 times each week. ? Heart-healthy fats. Healthy fats called Omega-3 fatty acids are found in foods such as flaxseeds and coldwater fish, like sardines, salmon, and mackerel.  Limit how much you eat of the following: ? Canned or prepackaged foods. ? Food that is high in trans fat, such as fried foods. ? Food that is high in saturated fat, such as fatty meat. ? Sweets, desserts, sugary drinks, and other foods with added sugar. ? Full-fat dairy products.  Do not salt foods before eating.  Try to eat at least 2 vegetarian meals each week.  Eat more home-cooked food and less restaurant, buffet, and fast food.  When eating at a restaurant, ask that your food be prepared with less salt or no salt, if possible. What foods are recommended? The items listed may not be a complete list. Talk with your dietitian about   what dietary choices are best for you. Grains Whole-grain or whole-wheat bread. Whole-grain or whole-wheat pasta. Brown rice. Oatmeal. Quinoa. Bulgur. Whole-grain and low-sodium cereals. Pita bread. Low-fat, low-sodium crackers. Whole-wheat flour tortillas. Vegetables Fresh or frozen vegetables (raw, steamed, roasted, or grilled). Low-sodium or reduced-sodium tomato and vegetable juice. Low-sodium or reduced-sodium tomato sauce and tomato paste. Low-sodium or reduced-sodium canned vegetables. Fruits All fresh, dried, or frozen fruit. Canned fruit in natural juice (without  added sugar). Meat and other protein foods Skinless chicken or turkey. Ground chicken or turkey. Pork with fat trimmed off. Fish and seafood. Egg whites. Dried beans, peas, or lentils. Unsalted nuts, nut butters, and seeds. Unsalted canned beans. Lean cuts of beef with fat trimmed off. Low-sodium, lean deli meat. Dairy Low-fat (1%) or fat-free (skim) milk. Fat-free, low-fat, or reduced-fat cheeses. Nonfat, low-sodium ricotta or cottage cheese. Low-fat or nonfat yogurt. Low-fat, low-sodium cheese. Fats and oils Soft margarine without trans fats. Vegetable oil. Low-fat, reduced-fat, or light mayonnaise and salad dressings (reduced-sodium). Canola, safflower, olive, soybean, and sunflower oils. Avocado. Seasoning and other foods Herbs. Spices. Seasoning mixes without salt. Unsalted popcorn and pretzels. Fat-free sweets. What foods are not recommended? The items listed may not be a complete list. Talk with your dietitian about what dietary choices are best for you. Grains Baked goods made with fat, such as croissants, muffins, or some breads. Dry pasta or rice meal packs. Vegetables Creamed or fried vegetables. Vegetables in a cheese sauce. Regular canned vegetables (not low-sodium or reduced-sodium). Regular canned tomato sauce and paste (not low-sodium or reduced-sodium). Regular tomato and vegetable juice (not low-sodium or reduced-sodium). Pickles. Olives. Fruits Canned fruit in a light or heavy syrup. Fried fruit. Fruit in cream or butter sauce. Meat and other protein foods Fatty cuts of meat. Ribs. Fried meat. Bacon. Sausage. Bologna and other processed lunch meats. Salami. Fatback. Hotdogs. Bratwurst. Salted nuts and seeds. Canned beans with added salt. Canned or smoked fish. Whole eggs or egg yolks. Chicken or turkey with skin. Dairy Whole or 2% milk, cream, and half-and-half. Whole or full-fat cream cheese. Whole-fat or sweetened yogurt. Full-fat cheese. Nondairy creamers. Whipped toppings.  Processed cheese and cheese spreads. Fats and oils Butter. Stick margarine. Lard. Shortening. Ghee. Bacon fat. Tropical oils, such as coconut, palm kernel, or palm oil. Seasoning and other foods Salted popcorn and pretzels. Onion salt, garlic salt, seasoned salt, table salt, and sea salt. Worcestershire sauce. Tartar sauce. Barbecue sauce. Teriyaki sauce. Soy sauce, including reduced-sodium. Steak sauce. Canned and packaged gravies. Fish sauce. Oyster sauce. Cocktail sauce. Horseradish that you find on the shelf. Ketchup. Mustard. Meat flavorings and tenderizers. Bouillon cubes. Hot sauce and Tabasco sauce. Premade or packaged marinades. Premade or packaged taco seasonings. Relishes. Regular salad dressings. Where to find more information:  National Heart, Lung, and Blood Institute: www.nhlbi.nih.gov  American Heart Association: www.heart.org Summary  The DASH eating plan is a healthy eating plan that has been shown to reduce high blood pressure (hypertension). It may also reduce your risk for type 2 diabetes, heart disease, and stroke.  With the DASH eating plan, you should limit salt (sodium) intake to 2,300 mg a day. If you have hypertension, you may need to reduce your sodium intake to 1,500 mg a day.  When on the DASH eating plan, aim to eat more fresh fruits and vegetables, whole grains, lean proteins, low-fat dairy, and heart-healthy fats.  Work with your health care provider or diet and nutrition specialist (dietitian) to adjust your eating plan to your   individual calorie needs. This information is not intended to replace advice given to you by your health care provider. Make sure you discuss any questions you have with your health care provider. Document Revised: 04/03/2017 Document Reviewed: 04/14/2016 Elsevier Patient Education  2020 Elsevier Inc.  

## 2019-11-17 NOTE — Progress Notes (Signed)
Patient ID: Tammy [name], female    [name]: 13-Dec-1960  Age: 59 y.o. MRN: [id number]    Subjective:  Subjective  HPI Tammy [name] presents for f/u er for bp-- her bp is now great.  It had spiked up to almost 200 due to stress.  She had several deaths in the family and she became overwhelmed   She has been under 21 day fast and has lost weight but going back to [facility] and wellness is too stressful for her right now.  She wants to go to pt in pool to help her.    Review of Systems  Constitutional: Negative for appetite change, diaphoresis, fatigue and unexpected weight change.  Eyes: Negative for pain, redness and visual disturbance.  Respiratory: Negative for cough, chest tightness, shortness of breath and wheezing.   Cardiovascular: Negative for chest pain, palpitations and leg swelling.  Endocrine: Negative for cold intolerance, heat intolerance, polydipsia, polyphagia and polyuria.  Genitourinary: Negative for difficulty urinating, dysuria and frequency.  Neurological: Negative for dizziness, light-headedness, numbness and headaches.    History Past Medical History:  Diagnosis Date  . Acute on chronic appendicitis s/p lap appy 06/10/2012 06/10/2012   surgery done 06-10-12  . Arthritis    ankle  . Back pain   . Chest pain   . Complication of anesthesia   . Constipation   . Diabetes mellitus    TYPE 2- no meds in several months-was taken off meds -monitoring blood sugar.  Marland Kitchen GERD (gastroesophageal reflux disease)   . History of colon polyps   . Hyperlipidemia   . Hypertension   . Joint pain   . Lactose intolerance   . Morbid obesity - BMI > 50 12/13/2012  . Pneumonia    none recent  . PONV (postoperative nausea and vomiting)   . Poor venous access    "usually difficult stick"  . Prediabetes   . Sinusitis   . SOB (shortness of breath)   . Swelling   . Vitamin D deficiency     She has a past surgical history that includes Abdominal hysterectomy; Knee surgery (Right); Upper  gastrointestinal endoscopy (04/14/2006); Colonoscopy (04/11/11); laparoscopic appendectomy (06/10/2012); Appendectomy; Gastric Roux-En-Y (N/A, 02/27/2015); and Colonoscopy with propofol (N/A, 05/11/2017).   Her family history includes Breast cancer in her paternal aunt and paternal aunt; Cancer (age of onset: 77) in her mother; Diabetes in her father and paternal grandmother; Heart disease in her father; Heart failure in her father; Hyperlipidemia in her brother and mother; Hypertension in her mother.She reports that she has never smoked. She has never used smokeless tobacco. She reports that she does not drink alcohol and does not use drugs.  Current Outpatient Medications on File Prior to Visit  Medication Sig Dispense Refill  . amLODipine (NORVASC) 2.5 MG tablet TAKE 1 TABLET BY MOUTH  TWICE DAILY 180 tablet 3  . carvedilol (COREG) 6.25 MG tablet Take 1 tablet (6.25 mg total) by mouth 2 (two) times daily with a meal. 180 tablet 3  . Cholecalciferol (VITAMIN D-3) 125 MCG (5000 UT) TABS 2 PO qd x 3 months, then 1 PO qd after that 180 tablet 3  . fluticasone (FLONASE) 50 MCG/ACT nasal spray Place into both nostrils.    . furosemide (LASIX) 40 MG tablet Take 1 tablet (40 mg total) by mouth daily. 90 tablet 3  . glucose blood test strip Check blood sugar twice daily 100 each 11  . loratadine (CLARITIN) 10 MG tablet Take 1 tablet (10 mg  total) by mouth daily. 90 tablet 3  . Multiple Vitamins-Minerals (MULTIVITAMIN ADULT PO) Take 1 tablet daily by mouth. flintstone vitamins    . NONFORMULARY OR COMPOUNDED ITEM Thigh high compression socks  20-30 mmhg    Re- low ext edema 1 each 1  . pantoprazole (PROTONIX) 40 MG tablet Take 2 tablets (80 mg total) by mouth daily. 180 tablet 3  . Vitamin D, Ergocalciferol, (DRISDOL) 1.25 MG (50000 UNIT) CAPS capsule Take 1 capsule (50,000 Units total) by mouth every 7 (seven) days. 12 capsule 2  . cyclobenzaprine (FLEXERIL) 10 MG tablet Take 1 tablet (10 mg total) by mouth 3  (three) times daily as needed for muscle spasms. (Patient not taking: Reported on 11/16/2019) 30 tablet 0  . metFORMIN (GLUCOPHAGE) 500 MG tablet Take 1 tablet (500 mg total) by mouth daily with breakfast. 90 tablet 1   No current facility-administered medications on file prior to visit.     Objective:  Objective  Physical Exam Vitals and nursing note reviewed.  Constitutional:      Appearance: She is well-developed.  HENT:     Head: Normocephalic and atraumatic.  Eyes:     Conjunctiva/sclera: Conjunctivae normal.  Neck:     Thyroid: No thyromegaly.     Vascular: No carotid bruit or JVD.  Cardiovascular:     Rate and Rhythm: Normal rate and regular rhythm.     Heart sounds: Normal heart sounds. No murmur heard.   Pulmonary:     Effort: Pulmonary effort is normal. No respiratory distress.     Breath sounds: Normal breath sounds. No wheezing or rales.  Chest:     Chest wall: No tenderness.  Musculoskeletal:     Cervical back: Normal range of motion and neck supple.  Neurological:     Mental Status: She is alert and oriented to person, place, and time.    BP 118/78 (BP Location: Left Arm, Patient Position: Sitting, Cuff Size: Large)   Pulse 61   Temp 98.2 F (36.8 C) (Temporal)   Resp 18   Ht 5\' 6"  (1.676 m)   Wt (!) 303 lb 3.2 oz (137.5 kg)   SpO2 98%   BMI 48.94 kg/m  Wt Readings from Last 3 Encounters:  11/17/19 (!) 303 lb 3.2 oz (137.5 kg)  11/07/19 (!) 305 lb (138.3 kg)  10/14/19 (!) 325 lb (147.4 kg)     Lab Results  Component Value Date   WBC 2.7 (L) 10/08/2018   HGB 12.6 10/08/2018   HCT 39.1 10/08/2018   PLT 254 10/08/2018   GLUCOSE 89 08/02/2019   CHOL 195 08/02/2019   TRIG 50.0 08/02/2019   HDL 61.10 08/02/2019   LDLDIRECT 129.1 10/17/2009   LDLCALC 124 (H) 08/02/2019   ALT 13 08/02/2019   AST 17 08/02/2019   NA 139 08/02/2019   K 3.9 08/02/2019   CL 104 08/02/2019   CREATININE 0.89 08/02/2019   BUN 12 08/02/2019   CO2 28 08/02/2019   TSH  3.13 08/02/2019   INR 1.1 (H) 10/16/2014   HGBA1C 5.8 08/02/2019   MICROALBUR <0.7 04/07/2019    No results found.   Assessment & Plan:  Plan  I am having Olena P. Kines maintain her glucose blood, Multiple Vitamins-Minerals (MULTIVITAMIN ADULT PO), NONFORMULARY OR COMPOUNDED ITEM, pantoprazole, furosemide, loratadine, metFORMIN, carvedilol, Vitamin D (Ergocalciferol), cyclobenzaprine, amLODipine, fluticasone, and Vitamin D-3.  No orders of the defined types were placed in this encounter.   Problem List Items Addressed This Visit  Unprioritized   Essential hypertension - Primary    Well controlled, no changes to meds. Encouraged heart healthy diet such as the DASH diet and exercise as tolerated.        Morbid obesity - BMI > 50   Relevant Orders   Ambulatory referral to Physical Therapy      Follow-up: Return in about 5 months (around 04/18/2020) for annual exam, fasting.  [name], DO

## 2019-11-22 ENCOUNTER — Ambulatory Visit: Payer: 59 | Admitting: Physical Therapy

## 2019-11-22 ENCOUNTER — Other Ambulatory Visit: Payer: Self-pay

## 2019-11-22 ENCOUNTER — Encounter: Payer: Self-pay | Admitting: Physical Therapy

## 2019-11-22 DIAGNOSIS — M6281 Muscle weakness (generalized): Secondary | ICD-10-CM

## 2019-11-22 DIAGNOSIS — M542 Cervicalgia: Secondary | ICD-10-CM | POA: Diagnosis not present

## 2019-11-22 DIAGNOSIS — R293 Abnormal posture: Secondary | ICD-10-CM

## 2019-11-22 DIAGNOSIS — M62838 Other muscle spasm: Secondary | ICD-10-CM

## 2019-11-22 NOTE — Therapy (Addendum)
[facility] [address]  [place], [place], [address] Phone: [phone]   Fax:  [phone]  Physical Therapy Treatment / Discharge Summary  Patient Details  Name: Tammy [name] MRN: [id number] Date of Birth: [birth year] Referring Provider (PT): Eunice Blase, MD   Encounter Date: 11/22/2019   PT End of Session - 11/22/19 1615    Visit Number 2    Number of Visits 12    Date for PT Re-Evaluation 12/28/19    Authorization Type [employer]    PT Start Time 1615    PT Stop Time 1708    PT Time Calculation (min) 53 min    Activity Tolerance Patient tolerated treatment well    Behavior During Therapy [facility] for tasks assessed/performed           Past Medical History:  Diagnosis Date  . Acute on chronic appendicitis s/p lap appy 06/10/2012 06/10/2012   surgery done 06-10-12  . Arthritis    ankle  . Back pain   . Chest pain   . Complication of anesthesia   . Constipation   . Diabetes mellitus    TYPE 2- no meds in several months-was taken off meds -monitoring blood sugar.  Marland Kitchen GERD (gastroesophageal reflux disease)   . History of colon polyps   . Hyperlipidemia   . Hypertension   . Joint pain   . Lactose intolerance   . Morbid obesity - BMI > 50 12/13/2012  . Pneumonia    none recent  . PONV (postoperative nausea and vomiting)   . Poor venous access    "usually difficult stick"  . Prediabetes   . Sinusitis   . SOB (shortness of breath)   . Swelling   . Vitamin D deficiency     Past Surgical History:  Procedure Laterality Date  . ABDOMINAL HYSTERECTOMY    . APPENDECTOMY     2'14  . COLONOSCOPY  04/11/11   5 mm polyp removed but not recovered  . COLONOSCOPY WITH PROPOFOL N/A 05/11/2017   Procedure: COLONOSCOPY WITH PROPOFOL;  Surgeon: Gatha Mayer, MD;  Location: [facility];  Service: Endoscopy;  Laterality: N/A;  . GASTRIC ROUX-EN-Y N/A 02/27/2015   Procedure: LAPAROSCOPIC ROUX-EN-Y GASTRIC BYPASS WITH  UPPER ENDOSCOPY;  Surgeon: Johnathan Hausen, MD;  Location: [facility];  Service: General;  Laterality: N/A;  . KNEE SURGERY Right     KNEE SURGERY   . LAPAROSCOPIC APPENDECTOMY  06/10/2012   Procedure: APPENDECTOMY LAPAROSCOPIC;  Surgeon: Adin Hector, MD;  Location: [facility];  Service: General;  Laterality: N/A;  . UPPER GASTROINTESTINAL ENDOSCOPY  04/14/2006   Normal    There were no vitals filed for this visit.   Subjective Assessment - 11/22/19 1618    Subjective Pt denies any pain today and states initial HEP doing well.    Diagnostic tests 04/12/19 cervical x-ray: 1. No acute osseous abnormality of the cervical spine.2. Multilevel degenerative disc disease with maximal intervertebraldisc height loss at C6-7 and with uncinate spurring facet hypertrophy resulting mild to moderate bilateral neural foraminal narrowing.    Patient Stated Goals "to balance myself where I don't have to use just one side"    Currently in Pain? No/denies    Pain Onset More than a month ago                             [facility] Adult PT Treatment/Exercise - 11/22/19 1615  Exercises   Exercises Neck      Neck Exercises: Machines for Strengthening   UBE (Upper Arm Bike) L1.0 x 6' (3' fwd/3' back)      Neck Exercises: Theraband   Rows 10 reps   yellow TB   Rows Limitations seated with pool noodle along spine to promote scap retraction    Shoulder External Rotation 10 reps   yellow TB   Shoulder External Rotation Limitations seated with pool noodle along spine to promote scap retraction    Horizontal ABduction 10 reps   yellow TB   Horizontal ABduction Limitations seated with pool noodle along spine to promote scap retraction      Neck Exercises: Seated   Neck Retraction 10 reps;5 secs    Neck Retraction Limitations fingertip guidance    Other Seated Exercise B scap retraction & depression 10 x 5"      Modalities   Modalities Moist Heat      Moist Heat Therapy   Number Minutes Moist  Heat 10 Minutes    Moist Heat Location Cervical;Shoulder   L upper shoulder     Manual Therapy   Manual Therapy Soft tissue mobilization;Myofascial release    Manual therapy comments seated    Soft tissue mobilization STM to B SCM, UT, LS and pecs    Myofascial Release manual TPR to B UT (L>R)      Neck Exercises: Stretches   Upper Trapezius Stretch Left;30 seconds;1 rep    Levator Stretch Left;30 seconds;1 rep    Neck Stretch 30 seconds;1 rep    Neck Stretch Limitations L SCM stretch    Corner Stretch 30 seconds;3 reps    Corner Stretch Limitations 3-way doorway pec stretch - pt noting increased discomfort with mid & upper positions, therefore instructed to perform only low position for now                    PT Short Term Goals - 11/22/19 1619      PT SHORT TERM GOAL #1   Title Patient will be independent with initial HEP    Status On-going    Target Date 12/07/19      PT SHORT TERM GOAL #2   Title Patient will verbalize/demonstrate good awareness of neutral spine and shoulder posture and proper body mechanics for daily tasks    Status On-going    Target Date 12/07/19             PT Long Term Goals - 11/22/19 1619      PT LONG TERM GOAL #1   Title Patient will be independent with ongoing/advanced HEP    Status On-going    Target Date 12/28/19      PT LONG TERM GOAL #2   Title Patient to demonstrate appropriate posture and body mechanics needed for daily activities    Status On-going    Target Date 12/28/19      PT LONG TERM GOAL #3   Title Patient to demonstrate improved tissue quality and pliability with reduced pain    Status On-going    Target Date 12/28/19      PT LONG TERM GOAL #4   Title Patient to improve cervical AROM to WNL without pain provocation    Status On-going    Target Date 12/28/19      PT LONG TERM GOAL #5   Title Patient will demonstrate improved B shoulder strength to >/= 4+/5 for functional UE use    Status On-going  Target Date 12/28/19      PT LONG TERM GOAL #6   Title Patient to report ability to perform work and daily activities without pain provocation    Status On-going    Target Date 12/28/19                 Plan - 11/22/19 1620    Clinical Impression Statement Tammy [name] reports HEP going well and denies pain today. HEP reviewed with pt able to perform good return demonstration of all exercises except notes increased pain with mid and upper positions for doorway pec stretch (states she has not been doing this exercise at home) - HEP pec stretch modified to low position only for now. Manual STM/MFR performed with pt seated today per her preference with decreased muscle tension noted today vs on eval visit. Progressed postural strengthening with seated scapular retraction exercises using pool noodle along spine in chair to promote improved scapular retraction - good tolerance for exercises with no increased pain reported. Session conclude with moist heat to L upper shoulder secondary to mild muscle soreness following MT.    Personal Factors and Comorbidities Comorbidity 3+;Time since onset of injury/illness/exacerbation;Past/Current Experience;Profession    Comorbidities OA, HTN, DM-II, BMI >50, GERD, HLD, R knee surgery    Examination-Activity Limitations Carry;Lift    Examination-Participation Restrictions Church;Volunteer;Cleaning;Shop;Yard Work    Rehab Potential Good    PT Frequency 2x / week   1-2x/wk as work schedule allows   PT Duration 6 weeks    PT Treatment/Interventions ADLs/Self Care Home Management;Cryotherapy;Electrical Stimulation;Iontophoresis 15m/ml Dexamethasone;Moist Heat;Traction;Ultrasound;Functional mobility training;Therapeutic activities;Therapeutic exercise;Neuromuscular re-education;Patient/family education;Manual techniques;Passive range of motion;Dry needling;Taping;Spinal Manipulations    PT Next Visit Plan posture & body mechanics education; postural strengthening -  update HEP as appropriate; manual therapy including potential DN for increased muscle tension/tightness; modalities PRN    PT Home Exercise Plan 7/14 - chin tuck, UT, LS, SCM & low doorway pec stretches, scap retraction    Consulted and Agree with Plan of Care Patient           Patient will benefit from skilled therapeutic intervention in order to improve the following deficits and impairments:  Decreased activity tolerance, Decreased range of motion, Decreased strength, Increased fascial restricitons, Increased muscle spasms, Impaired perceived functional ability, Impaired flexibility, Impaired UE functional use, Improper body mechanics, Postural dysfunction, Pain  Visit Diagnosis: Cervicalgia  Other muscle spasm  Abnormal posture  Muscle weakness (generalized)     Problem List Patient Active Problem List   Diagnosis Date Noted  . Hyperlipidemia associated with type 2 diabetes mellitus ([place] 04/07/2019  . Uncontrolled type 2 diabetes mellitus with hyperglycemia ([place] 04/07/2019  . Neck pain 04/07/2019  . Chronic seasonal allergic rhinitis due to pollen 04/07/2019  . Lymphedema 03/04/2019  . Edema due to malnutrition ([place] 10/10/2018  . Class 3 severe obesity with serious comorbidity and body mass index (BMI) of 50.0 to 59.9 in adult ([place] 08/30/2018  . Lower extremity edema 07/04/2015  . Hoarseness 06/15/2015  . Bronchospasm with bronchitis, acute 05/24/2015  . Elevated CA-125 03/16/2015  . 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 02/27/2015  . Enteritis 01/25/2015  . Ascites 01/25/2015  . Abdominal pain 01/22/2015  . Ovarian cyst, left 01/03/2015  . Allergic rhinitis 12/21/2013  . Right ankle pain 11/10/2013  . Morbid obesity - BMI > 50 12/13/2012  . Chronic vomiting 12/13/2012  . Lactose intolerance 10/22/2012  . Left knee pain 07/26/2012  . Bacterial vaginosis 06/10/2012  . Bradycardia, sinus 03/28/2012  .  Central positional vertigo 03/27/2012  . Tongue swelling  08/21/2011  . Urgency of urination 05/14/2011  . Personal history of colonic polyps 04/11/2011  . Otalgia 11/14/2010  . OTHER TENOSYNOVITIS OF HAND AND WRIST 07/19/2010  . DYSPEPSIA 10/17/2009  . BACK PAIN, LUMBAR, WITH RADICULOPATHY 12/27/2008  . VITAMIN B12 DEFICIENCY 09/29/2008  . SYNCOPE 08/21/2008  . ANKLE EDEMA, CHRONIC 12/16/2006  . DM (diabetes mellitus) type II, controlled, with peripheral vascular disorder ([place]) 11/09/2006  . Hyperlipidemia LDL goal <70 11/09/2006  . Essential hypertension 05/26/2006  . GERD 05/26/2006    Percival Spanish, PT, MPT 11/22/2019, 7:16 PM  [facility] [address]  [place], [place], [address] Phone: [phone]   Fax:  [phone]  Name: Tammy [name] MRN: [id number] Date of Birth: 25-Apr-1961   PHYSICAL THERAPY DISCHARGE SUMMARY  Visits from Start of Care: 2  Current functional level related to goals / functional outcomes:   Refer to above clinical impression for status as of last visit on 11/22/2019. Patient cancelled all future PT appointments and as of 12/21/2019, she requested to be discharged from PT at this location stating she was receiving PT at another location for her legs and wanted to concentrate on the PT for her legs.   Remaining deficits:   As above. Pt only returned for 1 treatment visit following the initial evaluation.   Education / Equipment:   HEP  Plan: Patient agrees to discharge.  Patient goals were not met. Patient is being discharged due to the patient's request.  ?????     Percival Spanish, PT, MPT 01/02/20, 4:01 PM  [facility] [address]  [place], [place], [address] Phone: [phone]   Fax:  [phone]

## 2019-12-01 ENCOUNTER — Ambulatory Visit: Payer: 59

## 2019-12-06 ENCOUNTER — Ambulatory Visit: Payer: 59 | Admitting: Medical

## 2019-12-12 ENCOUNTER — Encounter: Payer: 59 | Admitting: Physical Therapy

## 2019-12-15 ENCOUNTER — Ambulatory Visit (INDEPENDENT_AMBULATORY_CARE_PROVIDER_SITE_OTHER): Payer: 59 | Admitting: Family Medicine

## 2019-12-15 ENCOUNTER — Other Ambulatory Visit: Payer: Self-pay

## 2019-12-15 ENCOUNTER — Encounter: Payer: Self-pay | Admitting: Family Medicine

## 2019-12-15 VITALS — BP 122/88 | HR 59 | Temp 98.3°F | Resp 18 | Ht 66.0 in | Wt 314.8 lb

## 2019-12-15 DIAGNOSIS — L299 Pruritus, unspecified: Secondary | ICD-10-CM

## 2019-12-15 MED ORDER — TRIAMCINOLONE ACETONIDE 0.1 % EX CREA: 1.0000 "application " | TOPICAL_CREAM | Freq: Two times a day (BID) | CUTANEOUS | 2 refills | Status: DC

## 2019-12-15 NOTE — Progress Notes (Signed)
Patient ID: Tammy [name], female    [name]: 08-01-1960  Age: 59 y.o. MRN: [id number]    Subjective:  Subjective  HPI [name] presents for itching on her chest and back.  She went to therapy and was wrapped up in a heating system and then the rash started soon after.    No new lotions, soaps etc.  Pt has not been working outside.   Review of Systems  Constitutional: Negative for activity change, appetite change, diaphoresis, fatigue and unexpected weight change.  Eyes: Negative for pain, redness and visual disturbance.  Respiratory: Negative for cough, chest tightness, shortness of breath and wheezing.   Cardiovascular: Negative for chest pain, palpitations and leg swelling.  Endocrine: Negative for cold intolerance, heat intolerance, polydipsia, polyphagia and polyuria.  Genitourinary: Negative for difficulty urinating, dysuria and frequency.  Skin: Positive for rash.  Neurological: Negative for dizziness, light-headedness, numbness and headaches.  Psychiatric/Behavioral: Negative for behavioral problems and dysphoric mood. The patient is not nervous/anxious.     History Past Medical History:  Diagnosis Date  . Acute on chronic appendicitis s/p lap appy 06/10/2012 06/10/2012   surgery done 06-10-12  . Arthritis    ankle  . Back pain   . Chest pain   . Complication of anesthesia   . Constipation   . Diabetes mellitus    TYPE 2- no meds in several months-was taken off meds -monitoring blood sugar.  Marland Kitchen GERD (gastroesophageal reflux disease)   . History of colon polyps   . Hyperlipidemia   . Hypertension   . Joint pain   . Lactose intolerance   . Morbid obesity - BMI > 50 12/13/2012  . Pneumonia    none recent  . PONV (postoperative nausea and vomiting)   . Poor venous access    "usually difficult stick"  . Prediabetes   . Sinusitis   . SOB (shortness of breath)   . Swelling   . Vitamin D deficiency     She has a past surgical history that includes Abdominal  hysterectomy; Knee surgery (Right); Upper gastrointestinal endoscopy (04/14/2006); Colonoscopy (04/11/11); laparoscopic appendectomy (06/10/2012); Appendectomy; Gastric Roux-En-Y (N/A, 02/27/2015); and Colonoscopy with propofol (N/A, 05/11/2017).   Her family history includes Breast cancer in her paternal aunt and paternal aunt; Cancer (age of onset: 48) in her mother; Diabetes in her father and paternal grandmother; Heart disease in her father; Heart failure in her father; Hyperlipidemia in her brother and mother; Hypertension in her mother.She reports that she has never smoked. She has never used smokeless tobacco. She reports that she does not drink alcohol and does not use drugs.  Current Outpatient Medications on File Prior to Visit  Medication Sig Dispense Refill  . amLODipine (NORVASC) 2.5 MG tablet TAKE 1 TABLET BY MOUTH  TWICE DAILY 180 tablet 3  . carvedilol (COREG) 6.25 MG tablet Take 1 tablet (6.25 mg total) by mouth 2 (two) times daily with a meal. 180 tablet 3  . Cholecalciferol (VITAMIN D-3) 125 MCG (5000 UT) TABS 2 PO qd x 3 months, then 1 PO qd after that 180 tablet 3  . fluticasone (FLONASE) 50 MCG/ACT nasal spray Place into both nostrils.    . furosemide (LASIX) 40 MG tablet Take 1 tablet (40 mg total) by mouth daily. 90 tablet 3  . glucose blood test strip Check blood sugar twice daily 100 each 11  . loratadine (CLARITIN) 10 MG tablet Take 1 tablet (10 mg total) by mouth daily. 90 tablet 3  .  Multiple Vitamins-Minerals (MULTIVITAMIN ADULT PO) Take 1 tablet daily by mouth. flintstone vitamins    . NONFORMULARY OR COMPOUNDED ITEM Thigh high compression socks  20-30 mmhg    Re- low ext edema 1 each 1  . pantoprazole (PROTONIX) 40 MG tablet Take 2 tablets (80 mg total) by mouth daily. 180 tablet 3  . Vitamin D, Ergocalciferol, (DRISDOL) 1.25 MG (50000 UNIT) CAPS capsule Take 1 capsule (50,000 Units total) by mouth every 7 (seven) days. (Patient not taking: Reported on 12/19/2019) 12 capsule  2  . metFORMIN (GLUCOPHAGE) 500 MG tablet Take 1 tablet (500 mg total) by mouth daily with breakfast. 90 tablet 1   No current facility-administered medications on file prior to visit.     Objective:  Objective  Physical Exam Vitals and nursing note reviewed.  Constitutional:      Appearance: She is well-developed.  HENT:     Head: Normocephalic and atraumatic.  Eyes:     Conjunctiva/sclera: Conjunctivae normal.  Neck:     Thyroid: No thyromegaly.     Vascular: No carotid bruit or JVD.  Cardiovascular:     Rate and Rhythm: Normal rate and regular rhythm.     Heart sounds: Normal heart sounds. No murmur heard.   Pulmonary:     Effort: Pulmonary effort is normal. No respiratory distress.     Breath sounds: Normal breath sounds. No wheezing or rales.  Chest:     Chest wall: No tenderness.  Musculoskeletal:     Cervical back: Normal range of motion and neck supple.  Skin:    General: Skin is dry.     Findings: No erythema or rash.       Neurological:     Mental Status: She is alert and oriented to person, place, and time.    BP 122/88 (BP Location: Right Arm, Patient Position: Sitting, Cuff Size: Large)   Pulse (!) 59   Temp 98.3 F (36.8 C) (Oral)   Resp 18   Ht 5\' 6"  (1.676 m)   Wt (!) 314 lb 12.8 oz (142.8 kg)   SpO2 98%   BMI 50.81 kg/m  Wt Readings from Last 3 Encounters:  12/19/19 (!) 308 lb (139.7 kg)  12/15/19 (!) 314 lb 12.8 oz (142.8 kg)  11/17/19 (!) 303 lb 3.2 oz (137.5 kg)     Lab Results  Component Value Date   WBC 2.7 (L) 10/08/2018   HGB 12.6 10/08/2018   HCT 39.1 10/08/2018   PLT 254 10/08/2018   GLUCOSE 102 (H) 12/15/2019   CHOL 195 08/02/2019   TRIG 50.0 08/02/2019   HDL 61.10 08/02/2019   LDLDIRECT 129.1 10/17/2009   LDLCALC 124 (H) 08/02/2019   ALT 24 12/15/2019   AST 19 12/15/2019   NA 141 12/15/2019   K 3.9 12/15/2019   CL 104 12/15/2019   CREATININE 0.94 12/15/2019   BUN 12 12/15/2019   CO2 31 12/15/2019   TSH 3.13  08/02/2019   INR 1.1 (H) 10/16/2014   HGBA1C 5.8 08/02/2019   MICROALBUR <0.7 04/07/2019    No results found.   Assessment & Plan:  Plan  I have discontinued Florean P. Winkowski's cyclobenzaprine. I am also having her start on triamcinolone cream. Additionally, I am having her maintain her glucose blood, Multiple Vitamins-Minerals (MULTIVITAMIN ADULT PO), NONFORMULARY OR COMPOUNDED ITEM, pantoprazole, furosemide, loratadine, metFORMIN, carvedilol, Vitamin D (Ergocalciferol), amLODipine, fluticasone, and Vitamin D-3.  Meds ordered this encounter  Medications  . triamcinolone cream (KENALOG) 0.1 %  Sig: Apply 1 application topically 2 (two) times daily.    Dispense:  30 g    Refill:  2    Problem List Items Addressed This Visit    None    Visit Diagnoses    Pruritus    -  Primary   Relevant Medications   triamcinolone cream (KENALOG) 0.1 %   Other Relevant Orders   Comprehensive metabolic panel (Completed)    f/u prn   Follow-up: Return if symptoms worsen or fail to improve.  [name], DO

## 2019-12-15 NOTE — Patient Instructions (Signed)
Triamcinolone skin cream, ointment, lotion, or aerosol What is this medicine? TRIAMCINOLONE (trye am SIN oh lone) is a corticosteroid. It is used on the skin to reduce swelling, redness, itching, and allergic reactions. This medicine may be used for other purposes; ask your health care provider or pharmacist if you have questions. COMMON BRAND NAME(S): Aristocort, Aristocort A, Aristocort HP, Cinalog, Cinolar, DERMASORB TA Complete, Flutex, Kenalog, Pediaderm TA, Sila III, SP Rx 228, Triacet, Trianex, Triderm What should I tell my health care provider before I take this medicine? They need to know if you have any of these conditions:  any active infection  large areas of burned or damaged skin  skin wasting or thinning  an unusual or allergic reaction to triamcinolone, corticosteroids, other medicines, foods, dyes, or preservatives  pregnant or trying to get pregnant  breast-feeding How should I use this medicine? This medicine is for external use only. Do not take by mouth. Follow the directions on the prescription label. Wash your hands before and after use. Apply a thin film of medicine to the affected area. Do not cover with a bandage or dressing unless your doctor or health care professional tells you to. Do not use on healthy skin or over large areas of skin. Do not get this medicine in your eyes. If you do, rinse out with plenty of cool tap water. It is important not to use more medicine than prescribed. Do not use your medicine more often than directed. Talk to your pediatrician regarding the use of this medicine in children. Special care may be needed. Elderly patients are more likely to have damaged skin through aging, and this may increase side effects. This medicine should only be used for brief periods and infrequently in older patients. Overdosage: If you think you have taken too much of this medicine contact a poison control center or emergency room at once. NOTE: This medicine  is only for you. Do not share this medicine with others. What if I miss a dose? If you miss a dose, use it as soon as you can. If it is almost time for your next dose, use only that dose. Do not use double or extra doses. What may interact with this medicine? Interactions are not expected. This list may not describe all possible interactions. Give your health care provider a list of all the medicines, herbs, non-prescription drugs, or dietary supplements you use. Also tell them if you smoke, drink alcohol, or use illegal drugs. Some items may interact with your medicine. What should I watch for while using this medicine? Tell your doctor or health care professional if your symptoms do not start to get better within one week. Do not use for more than 14 days. Do not use on healthy skin or over large areas of skin. Tell your doctor or health care professional if you are exposed to anyone with measles or chickenpox, or if you develop sores or blisters that do not heal properly. Do not use an airtight bandage to cover the affected area unless your doctor or health care professional tells you to. If you are to cover the area, follow the instructions carefully. Covering the area where the medicine is applied can increase the amount that passes through the skin and increases the risk of side effects. If treating the diaper area of a child, avoid covering the treated area with tight-fitting diapers or plastic pants. This may increase the amount of medicine that passes through the skin and increase the risk  of serious side effects. What side effects may I notice from receiving this medicine? Side effects that you should report to your doctor or health care professional as soon as possible:  burning or itching of the skin  dark red spots on the skin  infection  painful, red, pus filled blisters in hair follicles  thinning of the skin, sunburn more likely especially on the face Side effects that usually  do not require medical attention (report to your doctor or health care professional if they continue or are bothersome):  dry skin, irritation  unusual increased growth of hair on the face or body This list may not describe all possible side effects. Call your doctor for medical advice about side effects. You may report side effects to FDA at 1-800-FDA-1088. Where should I keep my medicine? Keep out of the reach of children. Store at room temperature between 15 and 30 degrees C (59 and 86 degrees F). Do not freeze. Throw away any unused medicine after the expiration date. NOTE: This sheet is a summary. It may not cover all possible information. If you have questions about this medicine, talk to your doctor, pharmacist, or health care provider.  2020 Elsevier/Gold Standard (2018-01-21 10:50:30)

## 2019-12-16 LAB — COMPREHENSIVE METABOLIC PANEL
ALT: 24 U/L (ref 0–35)
AST: 19 U/L (ref 0–37)
Albumin: 3.8 g/dL (ref 3.5–5.2)
Alkaline Phosphatase: 84 U/L (ref 39–117)
BUN: 12 mg/dL (ref 6–23)
CO2: 31 mEq/L (ref 19–32)
Calcium: 8.8 mg/dL (ref 8.4–10.5)
Chloride: 104 mEq/L (ref 96–112)
Creatinine, Ser: 0.94 mg/dL (ref 0.40–1.20)
GFR: 73.78 mL/min (ref >60.00)
Glucose, Bld: 102 mg/dL — ABNORMAL HIGH (ref 70–99)
Potassium: 3.9 mEq/L (ref 3.5–5.1)
Sodium: 141 mEq/L (ref 135–145)
Total Bilirubin: 0.6 mg/dL (ref 0.2–1.2)
Total Protein: 6.6 g/dL (ref 6.0–8.3)

## 2019-12-19 ENCOUNTER — Ambulatory Visit (INDEPENDENT_AMBULATORY_CARE_PROVIDER_SITE_OTHER): Payer: 59 | Admitting: Family Medicine

## 2019-12-19 ENCOUNTER — Other Ambulatory Visit: Payer: Self-pay

## 2019-12-19 ENCOUNTER — Encounter (INDEPENDENT_AMBULATORY_CARE_PROVIDER_SITE_OTHER): Payer: Self-pay | Admitting: Family Medicine

## 2019-12-19 VITALS — BP 148/82 | HR 50 | Temp 97.9°F | Ht 66.0 in | Wt 308.0 lb

## 2019-12-19 DIAGNOSIS — E118 Type 2 diabetes mellitus with unspecified complications: Secondary | ICD-10-CM | POA: Diagnosis not present

## 2019-12-19 DIAGNOSIS — I10 Essential (primary) hypertension: Secondary | ICD-10-CM

## 2019-12-19 DIAGNOSIS — Z6841 Body Mass Index (BMI) 40.0 and over, adult: Secondary | ICD-10-CM

## 2019-12-19 NOTE — Progress Notes (Signed)
Chief Complaint:   OBESITY Tammy [name] is here to discuss her progress with her obesity treatment plan along with follow-up of her obesity related diagnoses. Tammy [name] is on the Category 3 Plan and states she is following her eating plan approximately 75% of the time. Tammy [name] states she is exercising for 0 minutes 0 times per week.  Today's visit was #: 42 Starting weight: 334 lbs Starting date: 05/25/2018 Today's weight: 308 lbs Today's date: 12/19/2019 Total lbs lost to date: 26 lbs Total lbs lost since last in-office visit: 18 lbs Total weight loss percentage to date: -7.78%  Interim History: Tammy [name] says she is doing well.  She is focusing on gratitude and giving for stress reduction.  She says she is happy with her success.  She will have labs drawn at her next visit.  Subjective:   1. Essential hypertension Review: taking medications as instructed, no medication side effects noted, no chest pain on exertion, no dyspnea on exertion, no swelling of ankles.  Blood pressure is elevated today.  She is uncomfortable with the cuff.  BP Readings from Last 3 Encounters:  12/19/19 (!) 148/82  12/15/19 122/88  11/17/19 118/78   2. Diabetes mellitus type 2 with complications (Tammy [name]) without long term use of insulin Medications reviewed. Diabetic ROS: no polyuria or polydipsia, no chest pain, dyspnea or TIA's, no numbness, tingling or pain in extremities.   Lab Results  Component Value Date   HGBA1C 5.8 08/02/2019   HGBA1C 6.0 04/12/2019   HGBA1C 5.9 (H) 10/08/2018   Lab Results  Component Value Date   MICROALBUR <0.7 04/07/2019   LDLCALC 124 (H) 08/02/2019   CREATININE 0.94 12/15/2019   Lab Results  Component Value Date   INSULIN 6.9 06/03/2018   Assessment/Plan:   1. Essential hypertension The current medical regimen is effective;  continue present plan and medications.  2. Diabetes mellitus type 2 with complications (Tammy [name]) without long term use of insulin The current medical  regimen is effective;  continue present plan and medications.  3. Class 3 severe obesity with serious comorbidity and body mass index (BMI) of 50.0 to 59.9 in adult, unspecified obesity type (Tammy [name]) Tammy [name] is currently in the action stage of change. As such, her goal is to continue with weight loss efforts. She has agreed to the Category 3 Plan.   Exercise goals: Older adults should follow the adult guidelines. When older adults cannot meet the adult guidelines, they should be as physically active as their abilities and conditions will allow.  Older adults should do exercises that maintain or improve balance if they are at risk of falling.   Behavioral modification strategies: increasing water intake and emotional eating strategies.  Tammy [name] has agreed to follow-up with our clinic in 4 weeks. She was informed of the importance of frequent follow-up visits to maximize her success with intensive lifestyle modifications for her multiple health conditions.   Objective:   Blood pressure (!) 148/82, pulse (!) 50, temperature 97.9 F (36.6 C), temperature source Oral, height 5\' 6"  (1.676 m), weight (!) 308 lb (139.7 kg), SpO2 100 %. Body mass index is 49.71 kg/m.  General: Cooperative, alert, well developed, in no acute distress. HEENT: Conjunctivae and lids unremarkable. Cardiovascular: Regular rhythm.  Lungs: Normal work of breathing. Neurologic: No focal deficits.   Lab Results  Component Value Date   CREATININE 0.94 12/15/2019   BUN 12 12/15/2019   NA 141 12/15/2019   K 3.9 12/15/2019   CL 104 12/15/2019  CO2 31 12/15/2019   Lab Results  Component Value Date   ALT 24 12/15/2019   AST 19 12/15/2019   ALKPHOS 84 12/15/2019   BILITOT 0.6 12/15/2019   Lab Results  Component Value Date   HGBA1C 5.8 08/02/2019   HGBA1C 6.0 04/12/2019   HGBA1C 5.9 (H) 10/08/2018   HGBA1C 6.1 (H) 06/03/2018   HGBA1C 6.1 03/10/2018   Lab Results  Component Value Date   INSULIN 6.9 06/03/2018    Lab Results  Component Value Date   TSH 3.13 08/02/2019   Lab Results  Component Value Date   CHOL 195 08/02/2019   HDL 61.10 08/02/2019   LDLCALC 124 (H) 08/02/2019   LDLDIRECT 129.1 10/17/2009   TRIG 50.0 08/02/2019   CHOLHDL 3 08/02/2019   Lab Results  Component Value Date   WBC 2.7 (L) 10/08/2018   HGB 12.6 10/08/2018   HCT 39.1 10/08/2018   MCV 88.1 10/08/2018   PLT 254 10/08/2018   Attestation Statements:   Reviewed by clinician on day of visit: allergies, medications, problem list, medical history, surgical history, family history, social history, and previous encounter notes.  Time spent on visit including pre-visit chart review and post-visit care and charting was 15 minutes.   I, Water quality scientist, CMA, am acting as transcriptionist for Briscoe Deutscher, DO  I have reviewed the above documentation for accuracy and completeness, and I agree with the above. Briscoe Deutscher, DO

## 2020-01-18 ENCOUNTER — Other Ambulatory Visit: Payer: Self-pay | Admitting: Family Medicine

## 2020-01-18 DIAGNOSIS — K219 Gastro-esophageal reflux disease without esophagitis: Secondary | ICD-10-CM

## 2020-01-19 ENCOUNTER — Ambulatory Visit (INDEPENDENT_AMBULATORY_CARE_PROVIDER_SITE_OTHER): Payer: 59 | Admitting: Family Medicine

## 2020-01-30 ENCOUNTER — Encounter (INDEPENDENT_AMBULATORY_CARE_PROVIDER_SITE_OTHER): Payer: Self-pay | Admitting: Family Medicine

## 2020-01-30 ENCOUNTER — Ambulatory Visit (INDEPENDENT_AMBULATORY_CARE_PROVIDER_SITE_OTHER): Payer: 59 | Admitting: Family Medicine

## 2020-01-30 ENCOUNTER — Other Ambulatory Visit: Payer: Self-pay

## 2020-01-30 VITALS — BP 150/84 | HR 46 | Temp 98.0°F | Ht 66.0 in | Wt 312.0 lb

## 2020-01-30 DIAGNOSIS — R609 Edema, unspecified: Secondary | ICD-10-CM

## 2020-01-30 DIAGNOSIS — Z9189 Other specified personal risk factors, not elsewhere classified: Secondary | ICD-10-CM

## 2020-01-30 DIAGNOSIS — E8881 Metabolic syndrome: Secondary | ICD-10-CM

## 2020-01-30 DIAGNOSIS — E559 Vitamin D deficiency, unspecified: Secondary | ICD-10-CM | POA: Diagnosis not present

## 2020-01-30 DIAGNOSIS — M25561 Pain in right knee: Secondary | ICD-10-CM | POA: Diagnosis not present

## 2020-01-30 DIAGNOSIS — Z6841 Body Mass Index (BMI) 40.0 and over, adult: Secondary | ICD-10-CM

## 2020-01-31 MED ORDER — VITAMIN D (ERGOCALCIFEROL) 1.25 MG (50000 UNIT) PO CAPS: 50000.0000 [IU] | ORAL_CAPSULE | ORAL | 2 refills | Status: DC

## 2020-01-31 MED ORDER — METFORMIN HCL 500 MG PO TABS: 500.0000 mg | ORAL_TABLET | Freq: Every day | ORAL | 0 refills | Status: DC

## 2020-01-31 MED ORDER — TRIAMCINOLONE ACETONIDE 0.1 % EX CREA: 1.0000 "application " | TOPICAL_CREAM | Freq: Two times a day (BID) | CUTANEOUS | 0 refills | Status: DC

## 2020-02-01 ENCOUNTER — Telehealth (INDEPENDENT_AMBULATORY_CARE_PROVIDER_SITE_OTHER): Payer: Self-pay | Admitting: Family Medicine

## 2020-02-01 NOTE — Telephone Encounter (Signed)
LMTCB

## 2020-02-01 NOTE — Progress Notes (Signed)
Chief Complaint:   OBESITY Tammy [name] is here to discuss her progress with her obesity treatment plan along with follow-up of her obesity related diagnoses. Tammy [name] is on the Category 3 Plan and states she is following her eating plan approximately 70% of the time. Tammy [name] states she is exercising for 0 minutes 0 times per week.  Today's visit was #: 20 Starting weight: 334 lbs Starting date: 05/25/2018 Today's weight: 312 lbs Today's date: 01/30/2020 Total lbs lost to date: 22 lbs Total lbs lost since last in-office visit: 0 Total weight loss percentage to date: -6.59%  Interim History: Tammy [name] says she is tired.  She has been working more.  She says she is already planning to take some time off to refocus.  Assessment/Plan:   1. Acute pain of right knee History of bilateral OA, status post Synvisc with Dr. Barbaraann Barthel over 1 year ago.  Recent plant/twist injury. Will continue to monitor symptoms as they relate to her weight loss journey. This issue directly impacts care plan for optimization of BMI and metabolic health as it impacts the patient's ability to make lifestyle changes.  2. Lipedema Bilateral.  Greater in the hips/upper legs.  Has new information from a co-worker regarding a lymphedema clinic.  - triamcinolone cream (KENALOG) 0.1 %; Apply 1 application topically 2 (two) times daily.  Dispense: 30 g; Refill: 0  3. Vitamin D deficiency Current vitamin D is 18.12, tested on 08/02/2019. Not at goal. Optimal goal > 50 ng/dL. There is also evidence to support a goal of >70 ng/dL in patients with cancer and heart disease. Plan: Continue Vitamin D @50 ,000 IU every week with follow-up for routine testing of Vitamin D at least 2-3 times per year to avoid over-replacement.  - Vitamin D, Ergocalciferol, (DRISDOL) 1.25 MG (50000 UNIT) CAPS capsule; Take 1 capsule (50,000 Units total) by mouth every 7 (seven) days.  Dispense: 12 capsule; Refill: 2  4. Insulin resistance Goal is HgbA1c < 5.7  and insulin level closer to 5. She will continue to focus on protein-rich, low simple carbohydrate foods. We reviewed the importance of hydration, regular exercise for stress reduction, and restorative sleep.   Lab Results  Component Value Date   INSULIN 6.9 06/03/2018   Lab Results  Component Value Date   HGBA1C 5.8 08/02/2019   - metFORMIN (GLUCOPHAGE) 500 MG tablet; Take 1 tablet (500 mg total) by mouth daily with breakfast.  Dispense: 90 tablet; Refill: 0  5. At risk for activity intolerance Tammy [name] was given approximately 15 minutes of exercise intolerance counseling today. She is 59 y.o. female and has risk factors exercise intolerance including obesity and knee pain. We discussed intensive lifestyle modifications today with an emphasis on specific weight loss instructions and strategies. Tammy [name] will slowly increase activity as tolerated.  6. Class 3 severe obesity with serious comorbidity and body mass index (BMI) of 50.0 to 59.9 in adult, unspecified obesity type (HCC)  Tammy [name] is currently in the action stage of change. As such, her goal is to continue with weight loss efforts. She has agreed to the Category 3 Plan.   Exercise goals: As tolerated.  Behavioral modification strategies: increasing lean protein intake, decreasing simple carbohydrates and increasing vegetables.  Tammy [name] has agreed to follow-up with our clinic in 4 weeks. She was informed of the importance of frequent follow-up visits to maximize her success with intensive lifestyle modifications for her multiple health conditions.   Objective:   Blood pressure (!) 150/84, pulse (!) 46, temperature  98 F (36.7 C), temperature source Oral, height 5\' 6"  (1.676 m), weight (!) 312 lb (141.5 kg), SpO2 100 %. Body mass index is 50.36 kg/m.  General: Cooperative, alert, well developed, in no acute distress. HEENT: Conjunctivae and lids unremarkable. Cardiovascular: Regular rhythm.  Lungs: Normal work of  breathing. Neurologic: No focal deficits.   Lab Results  Component Value Date   CREATININE 0.94 12/15/2019   BUN 12 12/15/2019   NA 141 12/15/2019   K 3.9 12/15/2019   CL 104 12/15/2019   CO2 31 12/15/2019   Lab Results  Component Value Date   ALT 24 12/15/2019   AST 19 12/15/2019   ALKPHOS 84 12/15/2019   BILITOT 0.6 12/15/2019   Lab Results  Component Value Date   HGBA1C 5.8 08/02/2019   HGBA1C 6.0 04/12/2019   HGBA1C 5.9 (H) 10/08/2018   HGBA1C 6.1 (H) 06/03/2018   HGBA1C 6.1 03/10/2018   Lab Results  Component Value Date   INSULIN 6.9 06/03/2018   Lab Results  Component Value Date   TSH 3.13 08/02/2019   Lab Results  Component Value Date   CHOL 195 08/02/2019   HDL 61.10 08/02/2019   LDLCALC 124 (H) 08/02/2019   LDLDIRECT 129.1 10/17/2009   TRIG 50.0 08/02/2019   CHOLHDL 3 08/02/2019   Lab Results  Component Value Date   WBC 2.7 (L) 10/08/2018   HGB 12.6 10/08/2018   HCT 39.1 10/08/2018   MCV 88.1 10/08/2018   PLT 254 10/08/2018   Attestation Statements:   Reviewed by clinician on day of visit: allergies, medications, problem list, medical history, surgical history, family history, social history, and previous encounter notes.  I, Water quality scientist, CMA, am acting as transcriptionist for Briscoe Deutscher, DO  I have reviewed the above documentation for accuracy and completeness, and I agree with the above. Briscoe Deutscher, DO

## 2020-02-01 NOTE — Telephone Encounter (Signed)
Patient needs BMI from first visit for her insurance company.  Please call her for she needs it documented on paper.

## 2020-02-14 ENCOUNTER — Other Ambulatory Visit: Payer: Self-pay

## 2020-02-14 ENCOUNTER — Ambulatory Visit (INDEPENDENT_AMBULATORY_CARE_PROVIDER_SITE_OTHER): Payer: 59 | Admitting: Family

## 2020-02-14 VITALS — BP 148/66 | HR 50 | Temp 98.6°F | Resp 16 | Ht 65.0 in | Wt 315.0 lb

## 2020-02-14 DIAGNOSIS — Z0000 Encounter for general adult medical examination without abnormal findings: Secondary | ICD-10-CM

## 2020-02-14 DIAGNOSIS — E2839 Other primary ovarian failure: Secondary | ICD-10-CM | POA: Diagnosis not present

## 2020-02-14 DIAGNOSIS — E1165 Type 2 diabetes mellitus with hyperglycemia: Secondary | ICD-10-CM

## 2020-02-14 NOTE — Patient Instructions (Signed)
Please schedule routine dental visit.

## 2020-02-14 NOTE — Progress Notes (Signed)
Subjective:    Patient ID: Tammy [name], female    [name]: 21-Nov-1960, 59 y.o.   MRN: [id number]  HPI  Patient is a 59 yr old female who presents today for cpx.  Patient presents today for complete physical.  Immunizations:  Flu shot today Diet: reports that she has been working on her diet Wt Readings from Last 3 Encounters:  02/14/20 (!) 315 lb (142.9 kg)  01/30/20 (!) 312 lb (141.5 kg)  12/19/19 (!) 308 lb (139.7 kg)  Exercise: not exercising, works 46 hrs a week on her feet Colonoscopy: 2019 Dexa: due Pap Smear: hysterectomy Mammogram:2/21 Dental: due Vision: up to date     Review of Systems  Constitutional: Negative for unexpected weight change.  HENT: Negative for hearing loss and rhinorrhea.   Eyes: Negative for visual disturbance.  Respiratory: Negative for cough and shortness of breath.   Cardiovascular: Negative for chest pain.  Gastrointestinal: Positive for constipation (uses miralax, dulcolax). Negative for diarrhea.  Genitourinary: Negative for dysuria and frequency.  Musculoskeletal: Positive for arthralgias (right knee, neck).  Skin: Negative for rash.  Neurological: Negative for headaches.  Hematological: Negative for adenopathy.  Psychiatric/Behavioral:       Denies depression/anxiety   Past Medical History:  Diagnosis Date  . Acute on chronic appendicitis s/p lap appy 06/10/2012 06/10/2012   surgery done 06-10-12  . Arthritis    ankle  . Back pain   . Chest pain   . Complication of anesthesia   . Constipation   . Diabetes mellitus    TYPE 2- no meds in several months-was taken off meds -monitoring blood sugar.  Marland Kitchen GERD (gastroesophageal reflux disease)   . History of colon polyps   . Hyperlipidemia   . Hypertension   . Joint pain   . Lactose intolerance   . Morbid obesity - BMI > 50 12/13/2012  . Pneumonia    none recent  . PONV (postoperative nausea and vomiting)   . Poor venous access    "usually difficult stick"  . Prediabetes   .  Sinusitis   . SOB (shortness of breath)   . Swelling   . Vitamin D deficiency      Social History   Socioeconomic History  . Marital status: Married    Spouse name: Dorothyann Peng  . Number of children: 0  . Years of education: Not on file  . Highest education level: Not on file  Occupational History  . 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[employer]  Tobacco Use  . Smoking status: Never Smoker  . Smokeless tobacco: Never Used  Vaping Use  . Vaping Use: Never used  Substance and Sexual Activity  . Alcohol use: No    Alcohol/week: 0.0 standard drinks  . Drug use: No  . Sexual activity: Not on file  Other Topics Concern  . Not on file  Social History Narrative   Exercise- no   Social Determinants of Health   Financial Resource Strain:   . Difficulty of Paying Living Expenses: Not on file  Food Insecurity:   . Worried About Charity fundraiser in the Last Year: Not on file  . Ran Out of Food in the Last Year: Not on file  Transportation Needs:   . Lack of Transportation (Medical): Not on file  . Lack of Transportation (Non-Medical): Not on file  Physical Activity:   . Days of Exercise per Week: Not on file  . Minutes of Exercise per Session:  Not on file  Stress:   . Feeling of Stress : Not on file  Social Connections:   . Frequency of Communication with Friends and Family: Not on file  . Frequency of Social Gatherings with Friends and Family: Not on file  . Attends Religious Services: Not on file  . Active Member of Clubs or Organizations: Not on file  . Attends Archivist Meetings: Not on file  . Marital Status: Not on file  Intimate Partner Violence:   . Fear of Current or Ex-Partner: Not on file  . Emotionally Abused: Not on file  . Physically Abused: Not on file  . Sexually Abused: Not on file    Past Surgical History:  Procedure Laterality Date  . ABDOMINAL HYSTERECTOMY    . APPENDECTOMY     2'14  . COLONOSCOPY  04/11/11   5 mm polyp  removed but not recovered  . COLONOSCOPY WITH PROPOFOL N/A 05/11/2017   Procedure: COLONOSCOPY WITH PROPOFOL;  Surgeon: Gatha Mayer, MD;  Location: [facility];  Service: Endoscopy;  Laterality: N/A;  . GASTRIC ROUX-EN-Y N/A 02/27/2015   Procedure: LAPAROSCOPIC ROUX-EN-Y GASTRIC BYPASS WITH UPPER ENDOSCOPY;  Surgeon: Johnathan Hausen, MD;  Location: [facility];  Service: General;  Laterality: N/A;  . KNEE SURGERY Right     KNEE SURGERY   . LAPAROSCOPIC APPENDECTOMY  06/10/2012   Procedure: APPENDECTOMY LAPAROSCOPIC;  Surgeon: Adin Hector, MD;  Location: [facility];  Service: General;  Laterality: N/A;  . UPPER GASTROINTESTINAL ENDOSCOPY  04/14/2006   Normal    Family History  Problem Relation Age of Onset  . Hyperlipidemia Mother   . Hypertension Mother   . Cancer Mother 52       hodgkins lymphoma  . Diabetes Father   . Heart failure Father   . Heart disease Father   . Hyperlipidemia Brother   . Diabetes Paternal Grandmother   . Breast cancer Paternal Aunt        unsure of age  . Breast cancer Paternal Aunt   . Colon cancer Neg Hx   . Heart attack Neg Hx   . Sudden death Neg Hx   . Colon polyps Neg Hx   . Esophageal cancer Neg Hx   . Stomach cancer Neg Hx   . Rectal cancer Neg Hx     Allergies  Allergen Reactions  . Ace Inhibitors Anaphylaxis    Bowel angioedema  . Cheese Nausea And Vomiting  . Red Dye Nausea And Vomiting  . [name] Flash [Fish Allergy] Nausea And Vomiting    Current Outpatient Medications on File Prior to Visit  Medication Sig Dispense Refill  . amLODipine (NORVASC) 2.5 MG tablet TAKE 1 TABLET BY MOUTH  TWICE DAILY 180 tablet 3  . carvedilol (COREG) 6.25 MG tablet Take 1 tablet (6.25 mg total) by mouth 2 (two) times daily with a meal. 180 tablet 3  . Cholecalciferol (VITAMIN D-3) 125 MCG (5000 UT) TABS 2 PO qd x 3 months, then 1 PO qd after that 180 tablet 3  . fluticasone (FLONASE) 50 MCG/ACT nasal spray Place into both nostrils.    . furosemide (LASIX) 40 MG  tablet Take 1 tablet (40 mg total) by mouth daily. 90 tablet 3  . glucose blood test strip Check blood sugar twice daily 100 each 11  . loratadine (CLARITIN) 10 MG tablet Take 1 tablet (10 mg total) by mouth daily. 90 tablet 3  . Multiple Vitamins-Minerals (MULTIVITAMIN ADULT PO) Take 1 tablet daily by mouth.  flintstone vitamins    . NONFORMULARY OR COMPOUNDED ITEM Thigh high compression socks  20-30 mmhg    Re- low ext edema 1 each 1  . pantoprazole (PROTONIX) 40 MG tablet TAKE 2 TABLETS BY MOUTH  DAILY 180 tablet 3  . triamcinolone cream (KENALOG) 0.1 % Apply 1 application topically 2 (two) times daily. 30 g 0  . metFORMIN (GLUCOPHAGE) 500 MG tablet Take 1 tablet (500 mg total) by mouth daily with breakfast. (Patient not taking: Reported on 02/14/2020) 90 tablet 0   No current facility-administered medications on file prior to visit.    BP (!) 148/66 (BP Location: Right Arm, Patient Position: Sitting, Cuff Size: Large)   Pulse (!) 50   Temp 98.6 F (37 C) (Oral)   Resp 16   Ht 5\' 5"  (1.651 m)   Wt (!) 315 lb (142.9 kg)   SpO2 100%   BMI 52.42 kg/m        Objective:   Physical Exam  Physical Exam  Constitutional: She is oriented to person, place, and time. She appears well-developed and well-nourished. No distress.  HENT:  Head: Normocephalic and atraumatic.  Right Ear: Tympanic membrane and ear canal normal.  Left Ear: Tympanic membrane and ear canal normal.  Mouth/Throat: Not examined- pt wearing mask.   Eyes: Pupils are equal, round, and reactive to light. No scleral icterus.  Neck: Normal range of motion. No thyromegaly present.  Cardiovascular: Normal rate and regular rhythm.   No murmur heard. Pulmonary/Chest: Effort normal and breath sounds normal. No respiratory distress. He has no wheezes. She has no rales. She exhibits no tenderness.  Abdominal: Soft. Bowel sounds are normal. She exhibits no distension and no mass. There is no tenderness. There is no rebound and no  guarding.  Musculoskeletal: She exhibits bilateral LE edema and bilateral LE lymphedema Lymphadenopathy:    She has no cervical adenopathy.  Neurological: She is alert and oriented to person, place, and time.  She exhibits normal muscle tone. Coordination normal.  Skin: Skin is warm and dry.  Psychiatric: She has a normal mood and affect. Her behavior is normal. Judgment and thought content normal.  Breasts: Examined lying Right: Without masses, retractions, discharge or axillary adenopathy.  Left: Without masses, retractions, discharge or axillary adenopathy.  Pelvic: deferred        Assessment & Plan:   Preventative care- discussed healthy diet, exercise, weight loss.  Declines flu shot today. Wants to get next visit. Colo/mammo up to date. Refer for dexa.  Labs as ordered.   This visit occurred during the SARS-CoV-2 public health emergency.  Safety protocols were in place, including screening questions prior to the visit, additional usage of staff PPE, and extensive cleaning of exam room while observing appropriate contact time as indicated for disinfecting solutions.        Assessment & Plan:

## 2020-02-15 LAB — HEMOGLOBIN A1C
Hgb A1c MFr Bld: 5.8 % of total Hgb — ABNORMAL HIGH (ref <5.7)
Mean Plasma Glucose: 120 (calc)
eAG (mmol/L): 6.6 (calc)

## 2020-02-15 LAB — COMPREHENSIVE METABOLIC PANEL
AG Ratio: 1.3 (calc) (ref 1.0–2.5)
ALT: 29 U/L (ref 6–29)
AST: 21 U/L (ref 10–35)
Albumin: 3.8 g/dL (ref 3.6–5.1)
Alkaline phosphatase (APISO): 92 U/L (ref 37–153)
BUN: 10 mg/dL (ref 7–25)
CO2: 29 mmol/L (ref 20–32)
Calcium: 8.7 mg/dL (ref 8.6–10.4)
Chloride: 104 mmol/L (ref 98–110)
Creat: 0.98 mg/dL (ref 0.50–1.05)
Globulin: 3 g/dL (calc) (ref 1.9–3.7)
Glucose, Bld: 100 mg/dL — ABNORMAL HIGH (ref 65–99)
Potassium: 3.8 mmol/L (ref 3.5–5.3)
Sodium: 142 mmol/L (ref 135–146)
Total Bilirubin: 0.6 mg/dL (ref 0.2–1.2)
Total Protein: 6.8 g/dL (ref 6.1–8.1)

## 2020-02-15 LAB — LIPID PANEL
Cholesterol: 187 mg/dL (ref <200)
HDL: 73 mg/dL (ref >=50)
LDL Cholesterol (Calc): 101 mg/dL (calc) — ABNORMAL HIGH
Non-HDL Cholesterol (Calc): 114 mg/dL (calc) (ref <130)
Total CHOL/HDL Ratio: 2.6 (calc) (ref <5.0)
Triglycerides: 44 mg/dL (ref <150)

## 2020-02-23 ENCOUNTER — Other Ambulatory Visit: Payer: Self-pay

## 2020-02-23 ENCOUNTER — Ambulatory Visit (HOSPITAL_BASED_OUTPATIENT_CLINIC_OR_DEPARTMENT_OTHER)
Admission: RE | Admit: 2020-02-23 | Discharge: 2020-02-23 | Disposition: A | Discharge status: Home / Self Care | Payer: 59 | Source: Ambulatory Visit | Attending: Family | Admitting: Family

## 2020-02-23 DIAGNOSIS — E2839 Other primary ovarian failure: Secondary | ICD-10-CM | POA: Diagnosis present

## 2020-02-23 DIAGNOSIS — Z0000 Encounter for general adult medical examination without abnormal findings: Secondary | ICD-10-CM

## 2020-02-24 ENCOUNTER — Encounter: Payer: Self-pay | Admitting: Family

## 2020-02-28 ENCOUNTER — Encounter (INDEPENDENT_AMBULATORY_CARE_PROVIDER_SITE_OTHER): Payer: Self-pay | Admitting: Family Medicine

## 2020-02-28 ENCOUNTER — Other Ambulatory Visit: Payer: Self-pay

## 2020-02-28 ENCOUNTER — Ambulatory Visit (INDEPENDENT_AMBULATORY_CARE_PROVIDER_SITE_OTHER): Payer: 59 | Admitting: Family Medicine

## 2020-02-28 VITALS — BP 126/75 | HR 54 | Temp 98.4°F | Ht 66.0 in | Wt 314.0 lb

## 2020-02-28 DIAGNOSIS — Z6841 Body Mass Index (BMI) 40.0 and over, adult: Secondary | ICD-10-CM

## 2020-02-28 DIAGNOSIS — E559 Vitamin D deficiency, unspecified: Secondary | ICD-10-CM

## 2020-02-28 DIAGNOSIS — R609 Edema, unspecified: Secondary | ICD-10-CM | POA: Diagnosis not present

## 2020-02-28 DIAGNOSIS — E8881 Metabolic syndrome: Secondary | ICD-10-CM

## 2020-02-28 DIAGNOSIS — E88819 Insulin resistance, unspecified: Secondary | ICD-10-CM

## 2020-03-01 NOTE — Progress Notes (Signed)
Chief Complaint:   OBESITY Tammy [name] is here to discuss her progress with her obesity treatment plan along with follow-up of her obesity related diagnoses.   Today's visit was #: 21 Starting weight: 334 lbs Starting date: 05/25/2018 Today's weight: 314 lbs Today's date: 02/28/2020 Total lbs lost to date: 20 lbs Body mass index is 50.68 kg/m.  Total weight loss percentage to date: -5.99%  Interim History: Tammy [name] says she plans to fast from October 31 through November 21.  She will just be drinking water, beet juice, and Naked juice.    Nutrition Plan: Category 3 Plan for 60% of the time. Anti-obesity medications: None.  Hunger is moderately controlled controlled. Cravings are moderately controlled controlled.  Activity: None.  Assessment/Plan:   1. Insulin resistance Tammy [name] stopped metformin due to bladder/bowel issues. She will continue to focus on protein-rich, low simple carbohydrate foods. We reviewed the importance of hydration, regular exercise for stress reduction, and restorative sleep.   Lab Results  Component Value Date   INSULIN 6.9 06/03/2018   Lab Results  Component Value Date   HGBA1C 5.8 (H) 02/14/2020   2. Lipedema Bilateral.  Greater in the hips/upper legs.  Has new information from a co-worker regarding a lymphedema clinic. We will continue to monitor symptoms as they relate to her weight loss journey.  3. Vitamin D deficiency Current vitamin D is 18.12, tested on 08/02/2019. Not at goal. Optimal goal > 50 ng/dL.   Plan:  []   Continue Vitamin D @50 ,000 IU every week. []   Continue home supplement daily. [x]   Follow-up for routine testing of Vitamin D at least 2-3 times per year to avoid over-replacement. []   Monitored by PCP.  4. Class 3 severe obesity with serious comorbidity and body mass index (BMI) of 50.0 to 59.9 in adult, unspecified obesity type (HCC)  Tammy [name] is currently in the action stage of change. As such, her goal is to continue with  weight loss efforts.   Nutrition goals: She has agreed to the Category 3 Plan.   Exercise goals: All adults should avoid inactivity. Some physical activity is better than none, and adults who participate in any amount of physical activity gain some health benefits.  Behavioral modification strategies: increasing lean protein intake, decreasing simple carbohydrates, increasing vegetables and increasing water intake.  Tammy [name] has agreed to follow-up with our clinic in 4 weeks. She was informed of the importance of frequent follow-up visits to maximize her success with intensive lifestyle modifications for her multiple health conditions.   Objective:   Blood pressure 126/75, pulse (!) 54, temperature 98.4 F (36.9 C), height 5\' 6"  (1.676 m), weight (!) 314 lb (142.4 kg), SpO2 97 %. Body mass index is 50.68 kg/m.  General: Cooperative, alert, well developed, in no acute distress. HEENT: Conjunctivae and lids unremarkable. Cardiovascular: Regular rhythm.  Lungs: Normal work of breathing. Neurologic: No focal deficits.   Lab Results  Component Value Date   CREATININE 0.98 02/14/2020   BUN 10 02/14/2020   NA 142 02/14/2020   K 3.8 02/14/2020   CL 104 02/14/2020   CO2 29 02/14/2020   Lab Results  Component Value Date   ALT 29 02/14/2020   AST 21 02/14/2020   ALKPHOS 84 12/15/2019   BILITOT 0.6 02/14/2020   Lab Results  Component Value Date   HGBA1C 5.8 (H) 02/14/2020   HGBA1C 5.8 08/02/2019   HGBA1C 6.0 04/12/2019   HGBA1C 5.9 (H) 10/08/2018   HGBA1C 6.1 (H) 06/03/2018  Lab Results  Component Value Date   INSULIN 6.9 06/03/2018   Lab Results  Component Value Date   TSH 3.13 08/02/2019   Lab Results  Component Value Date   CHOL 187 02/14/2020   HDL 73 02/14/2020   LDLCALC 101 (H) 02/14/2020   LDLDIRECT 129.1 10/17/2009   TRIG 44 02/14/2020   CHOLHDL 2.6 02/14/2020   Lab Results  Component Value Date   WBC 2.7 (L) 10/08/2018   HGB 12.6 10/08/2018   HCT 39.1  10/08/2018   MCV 88.1 10/08/2018   PLT 254 10/08/2018   Attestation Statements:   Reviewed by clinician on day of visit: allergies, medications, problem list, medical history, surgical history, family history, social history, and previous encounter notes.  Time spent on visit including pre-visit chart review and post-visit care and charting was 30 minutes.   I, Water quality scientist, CMA, am acting as transcriptionist for Briscoe Deutscher, DO  I have reviewed the above documentation for accuracy and completeness, and I agree with the above. Briscoe Deutscher, DO

## 2020-03-13 ENCOUNTER — Other Ambulatory Visit: Payer: Self-pay

## 2020-03-13 ENCOUNTER — Ambulatory Visit (INDEPENDENT_AMBULATORY_CARE_PROVIDER_SITE_OTHER): Payer: 59 | Admitting: Family Medicine

## 2020-03-13 ENCOUNTER — Encounter: Payer: Self-pay | Admitting: Family Medicine

## 2020-03-13 VITALS — BP 132/80 | HR 68 | Temp 98.0°F | Resp 18 | Ht 66.0 in | Wt 319.8 lb

## 2020-03-13 DIAGNOSIS — M5442 Lumbago with sciatica, left side: Secondary | ICD-10-CM | POA: Diagnosis not present

## 2020-03-13 DIAGNOSIS — Z23 Encounter for immunization: Secondary | ICD-10-CM

## 2020-03-13 DIAGNOSIS — G8929 Other chronic pain: Secondary | ICD-10-CM

## 2020-03-13 DIAGNOSIS — I10 Essential (primary) hypertension: Secondary | ICD-10-CM | POA: Diagnosis not present

## 2020-03-13 LAB — POC URINALSYSI DIPSTICK (AUTOMATED)
Bilirubin, UA: NEGATIVE
Blood, UA: NEGATIVE
Glucose, UA: NEGATIVE
Ketones, UA: NEGATIVE
Leukocytes, UA: NEGATIVE
Nitrite, UA: NEGATIVE
Protein, UA: NEGATIVE
Spec Grav, UA: 1.025 (ref 1.010–1.025)
Urobilinogen, UA: 1 E.U./dL
pH, UA: 6 (ref 5.0–8.0)

## 2020-03-13 MED ORDER — AMLODIPINE BESYLATE 2.5 MG PO TABS: 2.5000 mg | ORAL_TABLET | Freq: Two times a day (BID) | ORAL | 3 refills | Status: DC

## 2020-03-13 MED ORDER — CYCLOBENZAPRINE HCL 10 MG PO TABS: 10.0000 mg | ORAL_TABLET | Freq: Three times a day (TID) | ORAL | 1 refills | Status: DC | PRN

## 2020-03-13 NOTE — Progress Notes (Signed)
Patient ID: Tammy [name], female    [name]: Jun 15, 1960  Age: 59 y.o. MRN: [id number]    Subjective:  Subjective  HPI Tammy [name] presents for f/u htn.    Review of Systems  Constitutional: Negative for appetite change, diaphoresis, fatigue and unexpected weight change.  Eyes: Negative for pain, redness and visual disturbance.  Respiratory: Negative for cough, chest tightness, shortness of breath and wheezing.   Cardiovascular: Negative for chest pain, palpitations and leg swelling.  Endocrine: Negative for cold intolerance, heat intolerance, polydipsia, polyphagia and polyuria.  Genitourinary: Negative for difficulty urinating, dysuria and frequency.  Musculoskeletal: Positive for back pain. Negative for gait problem.  Neurological: Negative for dizziness, light-headedness, numbness and headaches.    History Past Medical History:  Diagnosis Date  . Acute on chronic appendicitis s/p lap appy 06/10/2012 06/10/2012   surgery done 06-10-12  . Arthritis    ankle  . Back pain   . Chest pain   . Complication of anesthesia   . Constipation   . Diabetes mellitus    TYPE 2- no meds in several months-was taken off meds -monitoring blood sugar.  Marland Kitchen GERD (gastroesophageal reflux disease)   . History of colon polyps   . Hyperlipidemia   . Hypertension   . Joint pain   . Lactose intolerance   . Morbid obesity - BMI > 50 12/13/2012  . Pneumonia    none recent  . PONV (postoperative nausea and vomiting)   . Poor venous access    "usually difficult stick"  . Prediabetes   . Sinusitis   . SOB (shortness of breath)   . Swelling   . Vitamin D deficiency     She has a past surgical history that includes Abdominal hysterectomy; Knee surgery (Right); Upper gastrointestinal endoscopy (04/14/2006); Colonoscopy (04/11/11); laparoscopic appendectomy (06/10/2012); Appendectomy; Gastric Roux-En-Y (N/A, 02/27/2015); and Colonoscopy with propofol (N/A, 05/11/2017).   Her family history includes Breast  cancer in her paternal aunt and paternal aunt; Cancer (age of onset: 41) in her mother; Diabetes in her father and paternal grandmother; Heart disease in her father; Heart failure in her father; Hyperlipidemia in her brother and mother; Hypertension in her mother.She reports that she has never smoked. She has never used smokeless tobacco. She reports that she does not drink alcohol and does not use drugs.  Current Outpatient Medications on File Prior to Visit  Medication Sig Dispense Refill  . carvedilol (COREG) 6.25 MG tablet Take 1 tablet (6.25 mg total) by mouth 2 (two) times daily with a meal. 180 tablet 3  . fluticasone (FLONASE) 50 MCG/ACT nasal spray Place into both nostrils.    . furosemide (LASIX) 40 MG tablet Take 1 tablet (40 mg total) by mouth daily. 90 tablet 3  . glucose blood test strip Check blood sugar twice daily 100 each 11  . loratadine (CLARITIN) 10 MG tablet Take 1 tablet (10 mg total) by mouth daily. 90 tablet 3  . Multiple Vitamins-Minerals (MULTIVITAMIN ADULT PO) Take 1 tablet daily by mouth. flintstone vitamins    . NONFORMULARY OR COMPOUNDED ITEM Thigh high compression socks  20-30 mmhg    Re- low ext edema 1 each 1  . pantoprazole (PROTONIX) 40 MG tablet TAKE 2 TABLETS BY MOUTH  DAILY 180 tablet 3   No current facility-administered medications on file prior to visit.     Objective:  Objective  Physical Exam Vitals and nursing note reviewed.  Constitutional:      Appearance: She is well-developed.  HENT:     Head: Normocephalic and atraumatic.  Eyes:     Conjunctiva/sclera: Conjunctivae normal.  Neck:     Thyroid: No thyromegaly.     Vascular: No carotid bruit or JVD.  Cardiovascular:     Rate and Rhythm: Normal rate and regular rhythm.     Heart sounds: Normal heart sounds. No murmur heard.   Pulmonary:     Effort: Pulmonary effort is normal. No respiratory distress.     Breath sounds: Normal breath sounds. No wheezing or rales.  Chest:     Chest wall:  No tenderness.  Musculoskeletal:        General: Tenderness present.     Cervical back: Normal range of motion and neck supple.  Neurological:     Mental Status: She is alert and oriented to person, place, and time.     Motor: No weakness.     Coordination: Coordination normal.     Gait: Gait normal.     Deep Tendon Reflexes: Reflexes normal.    BP 132/80 (BP Location: Right Arm, Patient Position: Sitting, Cuff Size: Large)   Pulse 68   Temp 98 F (36.7 C) (Oral)   Resp 18   Ht 5\' 6"  (1.676 m)   Wt (!) 319 lb 12.8 oz (145.1 kg)   SpO2 97%   BMI 51.62 kg/m  Wt Readings from Last 3 Encounters:  03/13/20 (!) 319 lb 12.8 oz (145.1 kg)  02/28/20 (!) 314 lb (142.4 kg)  02/14/20 (!) 315 lb (142.9 kg)     Lab Results  Component Value Date   WBC 2.7 (L) 10/08/2018   HGB 12.6 10/08/2018   HCT 39.1 10/08/2018   PLT 254 10/08/2018   GLUCOSE 100 (H) 02/14/2020   CHOL 187 02/14/2020   TRIG 44 02/14/2020   HDL 73 02/14/2020   LDLDIRECT 129.1 10/17/2009   LDLCALC 101 (H) 02/14/2020   ALT 29 02/14/2020   AST 21 02/14/2020   NA 142 02/14/2020   K 3.8 02/14/2020   CL 104 02/14/2020   CREATININE 0.98 02/14/2020   BUN 10 02/14/2020   CO2 29 02/14/2020   TSH 3.13 08/02/2019   INR 1.1 (H) 10/16/2014   HGBA1C 5.8 (H) 02/14/2020   MICROALBUR <0.7 04/07/2019    DG Bone Density  Result Date: 02/23/2020 EXAM: DUAL X-RAY ABSORPTIOMETRY (DXA) FOR BONE MINERAL DENSITY IMPRESSION: Tammy [name] Your patient Tammy [name] completed a BMD test on 02/23/2020 using the [place] (analysis version: 16.SP2) manufactured by [employer]. The following summarizes the results of our evaluation. [facility] PATIENT: Name: Tammy, [name] Patient ID: [id number] Birth Date: [birth year] Height: 65.0 in. Gender: Female Measured: 02/23/2020 Weight: 318.0 lbs. Indications: African-American, Diabetic, Estrogen Deficiency, Hysterectomy, Low Calcium Intake, Post Menopausal Fractures: Treatments:  Multivitamin ASSESSMENT: The BMD measured at Femur Neck Left is 1.022 g/cm2 with a T-score of -0.1. This patient is considered normal according to [facility]) criteria. L-3 & 4 was excluded due to degenerative changes. The scan quality is good. Site Region Measured Date Measured Age WHO YA BMD Classification T-score AP Spine L1-L2 02/23/2020 59.0 Normal 0.0 1.171 g/cm2 DualFemur Neck Left 2/23/2020 59.0 years Normal -0.1 1.022 g/cm2 World Health Organization [facility]) criteria for post-menopausal, Caucasian Women: Normal        T-score at or above -1 SD Low Bone Mass T-score between -1 and -2.5 SD Osteoporosis  T-score at or below -2.5 SD RECOMMENDATION:1. All patients should optimize calcium and vitamin D intake. 2.  Consider FDA-approved medical therapies in postmenopausal women and men aged 63 years and older, based on the following: a. A hip or vertebral(clinical or morphometric) fracture. b. T-Score < -2.5 at the femoral neck or spine after appropriate evaluation to exclude secondary causes c. Low bone mass (T-score between -1.0 and -2.5 at the femoral neck or spine) and a 10 year probability of a hip fracture >3% or a 10 year probability of major osteoporosis-related fracture > 20% based on the [place]-adapted WHO algorithm d. Clinical judgement and/or patient preferences may indicate treatment for people with 10-year fracture probabilities above or below these levels FOLLOW-UP: Patients with diagnosis of osteoporosis or at high risk for fracture should have regular bone mineral density tests. For patients eligible for Medicare, routine testing is allowed once every 2 years. The testing frequency can be increased to one year for patients who have rapidly progressing disease, those who are receiving or discontinuing medical therapy to restore bone mass, or have additional risk factors. I have reviewed this report and agree with the above findings. [facility] Electronically Signed   By:  Rolm Baptise M.D.   On: 02/23/2020 15:19     Assessment & Plan:  Plan  I have changed Tammy [name]'s amLODipine. I am also having her start on cyclobenzaprine. Additionally, I am having her maintain her glucose blood, Multiple Vitamins-Minerals (MULTIVITAMIN ADULT PO), NONFORMULARY OR COMPOUNDED ITEM, furosemide, loratadine, carvedilol, fluticasone, and pantoprazole.  Meds ordered this encounter  Medications  . amLODipine (NORVASC) 2.5 MG tablet    Sig: Take 1 tablet (2.5 mg total) by mouth 2 (two) times daily.    Dispense:  180 tablet    Refill:  3    Requesting 1 year supply  . cyclobenzaprine (FLEXERIL) 10 MG tablet    Sig: Take 1 tablet (10 mg total) by mouth 3 (three) times daily as needed for muscle spasms.    Dispense:  30 tablet    Refill:  1    Problem List Items Addressed This Visit      Unprioritized   Chronic left-sided low back pain with left-sided sciatica - Primary    Muscle relaxers help  Weight loss with help F/u ortho if needed       Relevant Medications   cyclobenzaprine (FLEXERIL) 10 MG tablet   Other Relevant Orders   POCT Urinalysis Dipstick (Automated) (Completed)   Essential hypertension    Well controlled, no changes to meds. Encouraged heart healthy diet such as the DASH diet and exercise as tolerated.       Relevant Medications   amLODipine (NORVASC) 2.5 MG tablet    Other Visit Diagnoses    Need for influenza vaccination       Relevant Orders   Flu Vaccine QUAD 36+ mos IM      Follow-up: Return in about 3 months (around 06/13/2020), or if symptoms worsen or fail to improve, for hypertension-----also schedule cpe for next year Oct .  [name], DO

## 2020-03-13 NOTE — Patient Instructions (Signed)

## 2020-03-14 DIAGNOSIS — G8929 Other chronic pain: Secondary | ICD-10-CM | POA: Insufficient documentation

## 2020-03-14 DIAGNOSIS — M545 Low back pain, unspecified: Secondary | ICD-10-CM | POA: Insufficient documentation

## 2020-03-14 NOTE — Assessment & Plan Note (Signed)
Well controlled, no changes to meds. Encouraged heart healthy diet such as the DASH diet and exercise as tolerated.  °

## 2020-03-14 NOTE — Assessment & Plan Note (Signed)
Muscle relaxers help  Weight loss with help F/u ortho if needed

## 2020-03-22 ENCOUNTER — Encounter: Payer: Self-pay | Admitting: Family Medicine

## 2020-03-26 ENCOUNTER — Other Ambulatory Visit: Payer: Self-pay

## 2020-03-26 ENCOUNTER — Encounter (INDEPENDENT_AMBULATORY_CARE_PROVIDER_SITE_OTHER): Payer: Self-pay | Admitting: Family Medicine

## 2020-03-26 ENCOUNTER — Ambulatory Visit (INDEPENDENT_AMBULATORY_CARE_PROVIDER_SITE_OTHER): Payer: 59 | Admitting: Family Medicine

## 2020-03-26 VITALS — BP 149/83 | HR 55 | Temp 97.8°F | Ht 66.0 in | Wt 312.0 lb

## 2020-03-26 DIAGNOSIS — I10 Essential (primary) hypertension: Secondary | ICD-10-CM

## 2020-03-26 DIAGNOSIS — E559 Vitamin D deficiency, unspecified: Secondary | ICD-10-CM | POA: Diagnosis not present

## 2020-03-26 DIAGNOSIS — E8881 Metabolic syndrome: Secondary | ICD-10-CM

## 2020-03-26 DIAGNOSIS — Z6841 Body Mass Index (BMI) 40.0 and over, adult: Secondary | ICD-10-CM

## 2020-03-26 DIAGNOSIS — M545 Low back pain, unspecified: Secondary | ICD-10-CM

## 2020-03-28 NOTE — Progress Notes (Signed)
Chief Complaint:   OBESITY Tammy [name] is here to discuss her progress with her obesity treatment plan along with follow-up of her obesity related diagnoses.   Today's visit was #: 22 Starting weight: 334 lbs Starting date: 05/25/2018 Today's weight: 312 lbs Today's date: 03/26/2020 Total lbs lost to date: 22 lbs Body mass index is 50.36 kg/m.  Total weight loss percentage to date: -6.59%  Interim History: Tammy [name] says she is tired of the current regimen.  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  Nutrition Plan: the Category 3 Plan for 60% of the time.  Hunger is moderately controlled controlled. Cravings are moderately controlled controlled.  Activity: None at this time. Sleep: Sleep is restful.   Assessment/Plan:   1. Essential hypertension Elevated today.  Medications: Norvasc, Coreg.   Plan: Avoid buying foods that are: processed, frozen, or prepackaged to avoid excess salt. We will continue to monitor symptoms as they relate to her weight loss journey.  BP Readings from Last 3 Encounters:  03/26/20 (!) 149/83  03/13/20 132/80  02/28/20 126/75   Lab Results  Component Value Date   CREATININE 0.98 02/14/2020   2. Left low back pain Tammy [name] is taking Flexeril as needed for back pain. We will continue to monitor symptoms as they relate to her weight loss journey. This issue directly impacts care plan for optimization of BMI and metabolic health as it impacts the patient's ability to make lifestyle changes.  3. Insulin resistance Goal is HgbA1c < 5.7, fasting insulin closer to 5.  Medication: None.  She will continue to focus on protein-rich, low simple carbohydrate foods. We reviewed the importance of hydration, regular exercise for stress reduction, and restorative sleep.   Lab Results  Component Value Date   HGBA1C 5.8 (H) 02/14/2020   Lab Results  Component Value Date   INSULIN 6.9 06/03/2018   4. Vitamin D deficiency Not at goal. Current vitamin D is 18.12, tested  on 08/02/2019. Optimal goal > 50 ng/dL.   Plan:  []   Continue Vitamin D @50 ,000 IU every week. [x]   Continue home supplement daily. [x]   Follow-up for routine testing of Vitamin D at least 2-3 times per year to avoid over-replacement.  5. Class 3 severe obesity with serious comorbidity and body mass index (BMI) of 50.0 to 59.9 in adult, unspecified obesity type ([place])  Course: Tammy [name] is currently in the action stage of change. As such, her goal is to continue with weight loss efforts.   Nutrition goals: She has agreed to keeping a food journal and adhering to recommended goals of 1500 calories and 100 grams of protein.   Exercise goals: As tolerated.  Behavioral modification strategies: increasing lean protein intake, decreasing simple carbohydrates, increasing vegetables, increasing water intake, dealing with family or coworker sabotage and holiday eating strategies .  Tammy [name] has agreed to follow-up with our clinic in 3-4 weeks. She was informed of the importance of frequent follow-up visits to maximize her success with intensive lifestyle modifications for her multiple health conditions.   Objective:   Blood pressure (!) 149/83, pulse (!) 55, temperature 97.8 F (36.6 C), temperature source Oral, height 5\' 6"  (1.676 m), weight (!) 312 lb (141.5 kg), SpO2 99 %. Body mass index is 50.36 kg/m.  General: Cooperative, alert, well developed, in no acute distress. HEENT: Conjunctivae and lids unremarkable. Cardiovascular: Regular rhythm.  Lungs: Normal work of breathing. Neurologic: No focal deficits.   Lab Results  Component Value Date   CREATININE 0.98 02/14/2020  BUN 10 02/14/2020   NA 142 02/14/2020   K 3.8 02/14/2020   CL 104 02/14/2020   CO2 29 02/14/2020   Lab Results  Component Value Date   ALT 29 02/14/2020   AST 21 02/14/2020   ALKPHOS 84 12/15/2019   BILITOT 0.6 02/14/2020   Lab Results  Component Value Date   HGBA1C 5.8 (H) 02/14/2020   HGBA1C 5.8 08/02/2019     HGBA1C 6.0 04/12/2019   HGBA1C 5.9 (H) 10/08/2018   HGBA1C 6.1 (H) 06/03/2018   Lab Results  Component Value Date   INSULIN 6.9 06/03/2018   Lab Results  Component Value Date   TSH 3.13 08/02/2019   Lab Results  Component Value Date   CHOL 187 02/14/2020   HDL 73 02/14/2020   LDLCALC 101 (H) 02/14/2020   LDLDIRECT 129.1 10/17/2009   TRIG 44 02/14/2020   CHOLHDL 2.6 02/14/2020   Lab Results  Component Value Date   WBC 2.7 (L) 10/08/2018   HGB 12.6 10/08/2018   HCT 39.1 10/08/2018   MCV 88.1 10/08/2018   PLT 254 10/08/2018   Attestation Statements:   Reviewed by clinician on day of visit: allergies, medications, problem list, medical history, surgical history, family history, social history, and previous encounter notes.  I, Water quality scientist, CMA, am acting as transcriptionist for Briscoe Deutscher, DO  I have reviewed the above documentation for accuracy and completeness, and I agree with the above. Briscoe Deutscher, DO

## 2020-04-04 ENCOUNTER — Other Ambulatory Visit: Payer: Self-pay

## 2020-04-04 ENCOUNTER — Ambulatory Visit (INDEPENDENT_AMBULATORY_CARE_PROVIDER_SITE_OTHER): Payer: 59 | Admitting: Family Medicine

## 2020-04-04 VITALS — BP 141/67 | Ht 66.0 in | Wt 312.0 lb

## 2020-04-04 DIAGNOSIS — M25562 Pain in left knee: Secondary | ICD-10-CM

## 2020-04-04 MED ORDER — MELOXICAM 15 MG PO TABS: ORAL_TABLET | ORAL | 1 refills | Status: DC

## 2020-04-04 NOTE — Patient Instructions (Signed)
It was great to meet you today! Thank you for letting me participate in your care!  Today, we discussed your left knee pain which is due to further degenerative changes to your left knee joint, or to say it another way worsening osteoarthritis.  Please take the Meloxicam as prescribed and you can use Tylenol Extra Strength for break through pain. You should also be icing your knee when at home at rest and keep it elevated.  Be well, Tammy Rutherford, DO PGY-4, Sports Medicine Fellow [place]

## 2020-04-04 NOTE — Progress Notes (Addendum)
    SUBJECTIVE:   CHIEF COMPLAINT / HPI:   Left knee pain Ms. Leoni is a very pleasant 59 year old female who is coming in due to left knee pain.  Of note she has a significant past medical history of tricompartmental osteoarthritis of both knees and states that last week she began having left knee pain and difficulty moving it.  She states the pain has gotten so bad in the past week that it does keep her up at night and prevents sleep.  She states that if she sits for long periods of time he gets very stiff and when she goes to stand up or walk it is very painful.  She states after walking for certain amount of time she feels like it actually gets better and she can "walk it out".  She does endorse it getting very stiff with periods of inactivity as well.  She states the pain got up to a 10 out of 10 was described as aching and throbbing but intermittent.  Seems to get better with rest and Tylenol.  She denies any numbness or tingling sensation around the knee radiating down the leg.  She states several years ago she did receive injections into her left knee with gel shots and she did have significant improvement after this.  She also has a history of receiving corticosteroid injections in her knees but states that she quit doing this as the shots became ineffective for controlling pain.  PERTINENT  PMH / PSH: Hyperlipidemia, type 2 diabetes, history of right and left knee osteoarthritis, morbid obesity  OBJECTIVE:   BP (!) 141/67   Ht 5\' 6"  (1.676 m)   Wt (!) 312 lb (141.5 kg)   BMI 50.36 kg/m   No flowsheet data found.  Knee, Left: Inspection was negative for erythema, ecchymosis, but difficult to assess for effusion due to obesity. No obvious bony abnormalities or signs of osteophyte development. Palpation yielded no asymmetric warmth; Positive for medial and lateral joint line tenderness; Active ROM decreased in flexion (95 degrees) and extension (3 degrees). Normal hamstring and  quadriceps strength. Neurovascularly intact bilaterally. Special Tests - difficult to perform given the size of the knee  ASSESSMENT/PLAN:   Left knee pain Most likely this is another flare of osteoarthritis.  Will treat with meloxicam 15 mg once daily scheduled with food for the next 10 to 14 days then she can take as needed.  I did encourage her also to take Tylenol on top of meloxicam for breakthrough pain.  Encouraged her to use ice and elevation when at home and at rest. -We will begin ordering and getting insurance approval for gel shots for the left knee today.  We will have her return once that has been approved and proceed with injection.  I did discuss with her given her BMI and her known osteoarthritis if this continues to get worse and she stopped responding to injections she may need to consider getting a total knee replacement however she would need to get her BMI under 40 to proceed with this.     Nuala Alpha, DO PGY-4, Sports Medicine Fellow [place]  Addendum:  Patient seen in the office by fellow.  His history, exam, plan of care were precepted with me.  Karlton Lemon MD Kirt Boys

## 2020-04-04 NOTE — Assessment & Plan Note (Addendum)
Most likely this is another flare of osteoarthritis.  Will treat with meloxicam 15 mg once daily scheduled with food for the next 10 to 14 days then she can take as needed.  I did encourage her also to take Tylenol on top of meloxicam for breakthrough pain.  Encouraged her to use ice and elevation when at home and at rest. -We will begin ordering and getting insurance approval for gel shots for the left knee today.  We will have her return once that has been approved and proceed with injection.  I did discuss with her given her BMI and her known osteoarthritis if this continues to get worse and she stopped responding to injections she may need to consider getting a total knee replacement however she would need to get her BMI under 40 to proceed with this.

## 2020-04-23 ENCOUNTER — Ambulatory Visit (INDEPENDENT_AMBULATORY_CARE_PROVIDER_SITE_OTHER): Payer: 59 | Admitting: Family Medicine

## 2020-04-23 ENCOUNTER — Encounter (INDEPENDENT_AMBULATORY_CARE_PROVIDER_SITE_OTHER): Payer: Self-pay | Admitting: Family Medicine

## 2020-04-23 ENCOUNTER — Other Ambulatory Visit: Payer: Self-pay

## 2020-04-23 VITALS — BP 165/84 | HR 55 | Temp 97.7°F | Ht 66.0 in | Wt 318.0 lb

## 2020-04-23 DIAGNOSIS — E559 Vitamin D deficiency, unspecified: Secondary | ICD-10-CM

## 2020-04-23 DIAGNOSIS — M545 Low back pain, unspecified: Secondary | ICD-10-CM

## 2020-04-23 DIAGNOSIS — I10 Essential (primary) hypertension: Secondary | ICD-10-CM

## 2020-04-23 DIAGNOSIS — Z6841 Body Mass Index (BMI) 40.0 and over, adult: Secondary | ICD-10-CM

## 2020-04-23 DIAGNOSIS — E8881 Metabolic syndrome: Secondary | ICD-10-CM | POA: Diagnosis not present

## 2020-04-23 NOTE — Progress Notes (Signed)
Chief Complaint:   OBESITY Tammy [name] is here to discuss her progress with her obesity treatment plan along with follow-up of her obesity related diagnoses.   Today's visit was #: 23 Starting weight: 334 lbs Starting date: 05/25/2018 Today's weight: 318 lbs Today's date: 04/23/2020 Total lbs lost to date: 16 lbs Body mass index is 51.33 kg/m.  Total weight loss percentage to date: -4.79%  Interim History: Tammy [name] says she has been having more low back pain and bilateral knee pain.  She worked 38 hours in the past 3 days.  She will be taking the next 2 weeks off for self-care. Nutrition Plan: the Category 3 Plan for 40% of the time.  Anti-obesity medications: None.  Hunger is well controlled. Cravings are well controlled.  Activity: No dedicated exercise right now - gets lots of steps at work.  Assessment/Plan:   1. Low back pain, acute on chronic, not improving with current treatment.  Worsening. With sciatics. Noted stenosis of CERVICAL spine imaging. Consider further imaging of low back and ESI. Tammy [name] does not tolerate medications well.   2. Essential hypertension Elevated today with increase in pain. Medications: Norvasc 2.5 mg daily, Coreg 6.25 mg daily.   Plan: Avoid buying foods that are: processed, frozen, or prepackaged to avoid excess salt. We will continue to monitor symptoms as they relate to her weight loss journey.  BP Readings from Last 3 Encounters:  04/23/20 (!) 165/84  04/04/20 (!) 141/67  03/26/20 (!) 149/83   Lab Results  Component Value Date   CREATININE 0.98 02/14/2020   3. Insulin resistance Improving, but not optimized. Goal is HgbA1c < 5.7, fasting insulin closer to 5.  Medication: None.  She will continue to focus on protein-rich, low simple carbohydrate foods. We reviewed the importance of hydration, regular exercise for stress reduction, and restorative sleep.   Lab Results  Component Value Date   HGBA1C 5.8 (H) 02/14/2020   Lab Results   Component Value Date   INSULIN 6.9 06/03/2018   4. Vitamin D deficiency Not at goal. Current vitamin D is 18.12, tested on 08/02/2019. Optimal goal > 50 ng/dL.   Plan:  []   Continue Vitamin D @50 ,000 IU every week. [x]   Continue home supplement daily. [x]   Follow-up for routine testing of Vitamin D at least 2-3 times per year to avoid over-replacement.  5. Class 3 severe obesity with serious comorbidity and body mass index (BMI) of 50.0 to 59.9 in adult, unspecified obesity type ([place])  Course: Tammy [name] is currently in the action stage of change. As such, her goal is to continue with weight loss efforts.   Nutrition goals: She has agreed to the Category 3 Plan.   Exercise goals: As tolerated.  Behavioral modification strategies: emotional eating strategies and planning for success.  Tammy [name] has agreed to follow-up with our clinic in 4 weeks. She was informed of the importance of frequent follow-up visits to maximize her success with intensive lifestyle modifications for her multiple health conditions.   Objective:   Blood pressure (!) 165/84, pulse (!) 55, temperature 97.7 F (36.5 C), temperature source Oral, height 5\' 6"  (1.676 m), weight (!) 318 lb (144.2 kg), SpO2 100 %. Body mass index is 51.33 kg/m.  General: Cooperative, alert, well developed, in no acute distress. HEENT: Conjunctivae and lids unremarkable. Cardiovascular: Regular rhythm.  Lungs: Normal work of breathing. Neurologic: No focal deficits.   Lab Results  Component Value Date   CREATININE 0.98 02/14/2020   BUN 10 02/14/2020  NA 142 02/14/2020   K 3.8 02/14/2020   CL 104 02/14/2020   CO2 29 02/14/2020   Lab Results  Component Value Date   ALT 29 02/14/2020   AST 21 02/14/2020   ALKPHOS 84 12/15/2019   BILITOT 0.6 02/14/2020   Lab Results  Component Value Date   HGBA1C 5.8 (H) 02/14/2020   HGBA1C 5.8 08/02/2019   HGBA1C 6.0 04/12/2019   HGBA1C 5.9 (H) 10/08/2018   HGBA1C 6.1 (H) 06/03/2018    Lab Results  Component Value Date   INSULIN 6.9 06/03/2018   Lab Results  Component Value Date   TSH 3.13 08/02/2019   Lab Results  Component Value Date   CHOL 187 02/14/2020   HDL 73 02/14/2020   LDLCALC 101 (H) 02/14/2020   LDLDIRECT 129.1 10/17/2009   TRIG 44 02/14/2020   CHOLHDL 2.6 02/14/2020   Lab Results  Component Value Date   WBC 2.7 (L) 10/08/2018   HGB 12.6 10/08/2018   HCT 39.1 10/08/2018   MCV 88.1 10/08/2018   PLT 254 10/08/2018   Attestation Statements:   Reviewed by clinician on day of visit: allergies, medications, problem list, medical history, surgical history, family history, social history, and previous encounter notes.  I, Water quality scientist, CMA, am acting as transcriptionist for Briscoe Deutscher, DO  I have reviewed the above documentation for accuracy and completeness, and I agree with the above. Briscoe Deutscher, DO

## 2020-05-01 ENCOUNTER — Ambulatory Visit: Payer: 59 | Attending: Internal Medicine

## 2020-05-01 ENCOUNTER — Other Ambulatory Visit (HOSPITAL_BASED_OUTPATIENT_CLINIC_OR_DEPARTMENT_OTHER): Payer: Self-pay | Admitting: Internal Medicine

## 2020-05-01 DIAGNOSIS — Z23 Encounter for immunization: Secondary | ICD-10-CM

## 2020-05-01 NOTE — Progress Notes (Signed)
   Covid-19 Vaccination Clinic  Name:  SPIRIT WERNLI    MRN: [id number] [name]: [birth year]  05/01/2020  Ms. Piscitelli was observed post Covid-19 immunization for 15 minutes without incident. She was provided with Vaccine Information Sheet and instruction to access the V-Safe system.  Vaccinated by Fredirick Maudlin  Ms. Fennel was instructed to call 911 with any severe reactions post vaccine: Marland Kitchen Difficulty breathing  . Swelling of face and throat  . A fast heartbeat  . A bad rash all over body  . Dizziness and weakness   Immunizations Administered    Name Date Dose VIS Date Route   Pfizer COVID-19 Vaccine 05/01/2020  2:34 PM 0.3 mL 02/22/2020 Intramuscular   Manufacturer: [employer], [employer]   Lot: [id number]   NDC: [address]-9

## 2020-05-02 MED FILL — PFIZER-BIONTECH COVID-19 VA: 30 | 21 days supply | Qty: 0 | Fill #0

## 2020-05-11 LAB — HM DIABETES EYE EXAM

## 2020-05-15 ENCOUNTER — Other Ambulatory Visit: Payer: Self-pay

## 2020-05-17 ENCOUNTER — Other Ambulatory Visit: Payer: Self-pay | Admitting: Family Medicine

## 2020-05-17 DIAGNOSIS — Z1231 Encounter for screening mammogram for malignant neoplasm of breast: Secondary | ICD-10-CM

## 2020-05-22 ENCOUNTER — Other Ambulatory Visit: Payer: Self-pay

## 2020-05-22 MED ORDER — FLUTICASONE PROPIONATE 50 MCG/ACT NA SUSP: 1.0000 | Freq: Two times a day (BID) | NASAL | 0 refills | Status: DC

## 2020-05-29 ENCOUNTER — Telehealth (INDEPENDENT_AMBULATORY_CARE_PROVIDER_SITE_OTHER): Payer: 59 | Admitting: Family Medicine

## 2020-05-30 ENCOUNTER — Encounter: Payer: Self-pay | Admitting: Family Medicine

## 2020-05-30 NOTE — Telephone Encounter (Signed)
FYI

## 2020-06-13 ENCOUNTER — Other Ambulatory Visit: Payer: Self-pay

## 2020-06-13 ENCOUNTER — Ambulatory Visit (INDEPENDENT_AMBULATORY_CARE_PROVIDER_SITE_OTHER): Payer: 59 | Admitting: Podiatry

## 2020-06-13 ENCOUNTER — Ambulatory Visit (INDEPENDENT_AMBULATORY_CARE_PROVIDER_SITE_OTHER): Payer: 59

## 2020-06-13 ENCOUNTER — Encounter: Payer: Self-pay | Admitting: Podiatry

## 2020-06-13 DIAGNOSIS — M79671 Pain in right foot: Secondary | ICD-10-CM

## 2020-06-13 DIAGNOSIS — M79672 Pain in left foot: Secondary | ICD-10-CM

## 2020-06-13 DIAGNOSIS — M722 Plantar fascial fibromatosis: Secondary | ICD-10-CM

## 2020-06-13 DIAGNOSIS — M779 Enthesopathy, unspecified: Secondary | ICD-10-CM

## 2020-06-13 MED ORDER — TRIAMCINOLONE ACETONIDE 10 MG/ML IJ SUSP
10.0000 mg | Freq: Once | INTRAMUSCULAR | Status: AC
  Administered 2020-06-13: 10 mg

## 2020-06-14 NOTE — Progress Notes (Signed)
Subjective:   Patient ID: Tammy [name], female   [name]: 60 y.o.   MRN: [id number]   HPI Patient states she has been working a lot of hours and has developed pain in her right ankle and to the outside of the foot.  Obesity is complicating factor and she does work cement floors   ROS      Objective:  Physical Exam  Neurovascular status unchanged negative [name]' sign was noted with exquisite discomfort into the sinus tarsi right and distal to this area on the lateral side.  Patient is found to have moderate flatfoot deformity and swelling around the ankle region     Assessment:  Capsulitis of the sinus tarsi right with inflammation along with obesity and flatfoot deformity     Plan:  H&P reviewed condition sterile prep and injected the sinus tarsi right 3 mg Kenalog 5 mg Xylocaine and dispensed a power step type insole to support the arch.  Patient will be seen back to recheck  X-rays indicate bilateral collapse medial longitudinal arch with profound obesity around the foot and ankle

## 2020-06-18 ENCOUNTER — Telehealth: Payer: Self-pay | Admitting: Podiatry

## 2020-06-18 NOTE — Telephone Encounter (Signed)
Left message for pt to call to discuss orthotic benefits per Dr Paulla Dolly.

## 2020-06-19 ENCOUNTER — Encounter: Payer: Self-pay | Admitting: Family Medicine

## 2020-06-19 ENCOUNTER — Ambulatory Visit (INDEPENDENT_AMBULATORY_CARE_PROVIDER_SITE_OTHER): Payer: 59 | Admitting: Family Medicine

## 2020-06-19 ENCOUNTER — Other Ambulatory Visit: Payer: Self-pay

## 2020-06-19 VITALS — BP 140/88 | HR 57 | Temp 98.8°F | Resp 18 | Ht 66.0 in | Wt 326.6 lb

## 2020-06-19 DIAGNOSIS — M8949 Other hypertrophic osteoarthropathy, multiple sites: Secondary | ICD-10-CM

## 2020-06-19 DIAGNOSIS — I10 Essential (primary) hypertension: Secondary | ICD-10-CM

## 2020-06-19 DIAGNOSIS — M159 Polyosteoarthritis, unspecified: Secondary | ICD-10-CM

## 2020-06-19 DIAGNOSIS — K219 Gastro-esophageal reflux disease without esophagitis: Secondary | ICD-10-CM

## 2020-06-19 DIAGNOSIS — E785 Hyperlipidemia, unspecified: Secondary | ICD-10-CM | POA: Diagnosis not present

## 2020-06-19 LAB — COMPREHENSIVE METABOLIC PANEL
ALT: 25 U/L (ref 0–35)
AST: 16 U/L (ref 0–37)
Albumin: 3.7 g/dL (ref 3.5–5.2)
Alkaline Phosphatase: 95 U/L (ref 39–117)
BUN: 17 mg/dL (ref 6–23)
CO2: 31 mEq/L (ref 19–32)
Calcium: 8.6 mg/dL (ref 8.4–10.5)
Chloride: 105 mEq/L (ref 96–112)
Creatinine, Ser: 0.97 mg/dL (ref 0.40–1.20)
GFR: 64.02 mL/min (ref >60.00)
Glucose, Bld: 93 mg/dL (ref 70–99)
Potassium: 3.8 mEq/L (ref 3.5–5.1)
Sodium: 140 mEq/L (ref 135–145)
Total Bilirubin: 0.6 mg/dL (ref 0.2–1.2)
Total Protein: 6.8 g/dL (ref 6.0–8.3)

## 2020-06-19 LAB — LIPID PANEL
Cholesterol: 178 mg/dL (ref 0–200)
HDL: 70.2 mg/dL (ref >39.00)
LDL Cholesterol: 101 mg/dL — ABNORMAL HIGH (ref 0–99)
NonHDL: 107.86
Total CHOL/HDL Ratio: 3
Triglycerides: 36 mg/dL (ref 0.0–149.0)
VLDL: 7.2 mg/dL (ref 0.0–40.0)

## 2020-06-19 LAB — HEMOGLOBIN A1C: Hgb A1c MFr Bld: 5.9 % (ref 4.6–6.5)

## 2020-06-19 MED ORDER — PANTOPRAZOLE SODIUM 40 MG PO TBEC: 80.0000 mg | DELAYED_RELEASE_TABLET | Freq: Every day | ORAL | 3 refills | Status: DC

## 2020-06-19 MED ORDER — CARVEDILOL 6.25 MG PO TABS: 6.2500 mg | ORAL_TABLET | Freq: Two times a day (BID) | ORAL | 3 refills | Status: DC

## 2020-06-19 MED ORDER — FLUTICASONE PROPIONATE 50 MCG/ACT NA SUSP: 1.0000 | Freq: Two times a day (BID) | NASAL | 0 refills | Status: DC

## 2020-06-19 NOTE — Assessment & Plan Note (Signed)
Pt has done great with weight loss Refer to pt for -- water therapy

## 2020-06-19 NOTE — Assessment & Plan Note (Signed)
Encouraged heart healthy diet, increase exercise, avoid trans fats, consider a krill oil cap daily 

## 2020-06-19 NOTE — Patient Instructions (Signed)

## 2020-06-19 NOTE — Assessment & Plan Note (Signed)
Well controlled, no changes to meds. Encouraged heart healthy diet such as the DASH diet and exercise as tolerated.  °

## 2020-06-19 NOTE — Progress Notes (Signed)
Patient ID: Tammy [name], female    [name]: February 16, 1961  Age: 60 y.o. MRN: [id number]    Subjective:  Subjective  HPI Tammy [name] presents for f/u bp   Pt is c/o difficulty losing weight and is seeing podiatry due to foot pain   Review of Systems  Constitutional: Negative for appetite change, diaphoresis, fatigue and unexpected weight change.  Eyes: Negative for pain, redness and visual disturbance.  Respiratory: Negative for cough, chest tightness, shortness of breath and wheezing.   Cardiovascular: Negative for chest pain, palpitations and leg swelling.  Endocrine: Negative for cold intolerance, heat intolerance, polydipsia, polyphagia and polyuria.  Genitourinary: Negative for difficulty urinating, dysuria and frequency.  Neurological: Negative for dizziness, light-headedness, numbness and headaches.    History Past Medical History:  Diagnosis Date  . Acute on chronic appendicitis s/p lap appy 06/10/2012 06/10/2012   surgery done 06-10-12  . Arthritis    ankle  . Back pain   . Chest pain   . Complication of anesthesia   . Constipation   . Diabetes mellitus    TYPE 2- no meds in several months-was taken off meds -monitoring blood sugar.  Marland Kitchen GERD (gastroesophageal reflux disease)   . History of colon polyps   . Hyperlipidemia   . Hypertension   . Joint pain   . Lactose intolerance   . Morbid obesity - BMI > 50 12/13/2012  . Pneumonia    none recent  . PONV (postoperative nausea and vomiting)   . Poor venous access    "usually difficult stick"  . Prediabetes   . Sinusitis   . SOB (shortness of breath)   . Swelling   . Vitamin D deficiency     She has a past surgical history that includes Abdominal hysterectomy; Knee surgery (Right); Upper gastrointestinal endoscopy (04/14/2006); Colonoscopy (04/11/11); laparoscopic appendectomy (06/10/2012); Appendectomy; Gastric Roux-En-Y (N/A, 02/27/2015); and Colonoscopy with propofol (N/A, 05/11/2017).   Her family history includes  Breast cancer in her paternal aunt and paternal aunt; Cancer (age of onset: 82) in her mother; Diabetes in her father and paternal grandmother; Heart disease in her father; Heart failure in her father; Hyperlipidemia in her brother and mother; Hypertension in her mother.She reports that she has never smoked. She has never used smokeless tobacco. She reports that she does not drink alcohol and does not use drugs.  Current Outpatient Medications on File Prior to Visit  Medication Sig Dispense Refill  . amLODipine (NORVASC) 2.5 MG tablet Take 1 tablet (2.5 mg total) by mouth 2 (two) times daily. 180 tablet 3  . cyclobenzaprine (FLEXERIL) 10 MG tablet Take 1 tablet (10 mg total) by mouth 3 (three) times daily as needed for muscle spasms. 30 tablet 1  . furosemide (LASIX) 40 MG tablet Take 1 tablet (40 mg total) by mouth daily. 90 tablet 3  . glucose blood test strip Check blood sugar twice daily 100 each 11  . loratadine (CLARITIN) 10 MG tablet Take 1 tablet (10 mg total) by mouth daily. 90 tablet 3  . meloxicam (MOBIC) 15 MG tablet Take 1 tablet daily with food for 14 days. Then take as needed. 30 tablet 1  . Multiple Vitamins-Minerals (MULTIVITAMIN ADULT PO) Take 1 tablet by mouth daily. flintstone vitamins    . NONFORMULARY OR COMPOUNDED ITEM Thigh high compression socks  20-30 mmhg    Re- low ext edema 1 each 1   No current facility-administered medications on file prior to visit.     Objective:  Objective  Physical Exam Vitals and nursing note reviewed.  Constitutional:      Appearance: She is well-developed and well-nourished.  HENT:     Head: Normocephalic and atraumatic.  Eyes:     Extraocular Movements: EOM normal.     Conjunctiva/sclera: Conjunctivae normal.  Neck:     Thyroid: No thyromegaly.     Vascular: No carotid bruit or JVD.  Cardiovascular:     Rate and Rhythm: Normal rate and regular rhythm.     Heart sounds: Normal heart sounds. No murmur heard.   Pulmonary:      Effort: Pulmonary effort is normal. No respiratory distress.     Breath sounds: Normal breath sounds. No wheezing or rales.  Chest:     Chest wall: No tenderness.  Musculoskeletal:        General: Swelling and tenderness present.     Cervical back: Normal range of motion and neck supple.     Right lower leg: Edema present.     Left lower leg: Edema present.     Right ankle: Swelling present.     Left ankle: Swelling present.     Right foot: Tenderness present.       Legs:  Neurological:     Mental Status: She is alert and oriented to person, place, and time.  Psychiatric:        Mood and Affect: Mood and affect normal.    BP 140/88 (BP Location: Right Arm, Patient Position: Sitting, Cuff Size: Large)   Pulse (!) 57   Temp 98.8 F (37.1 C) (Oral)   Resp 18   Ht 5\' 6"  (1.676 m)   Wt (!) 326 lb 9.6 oz (148.1 kg)   SpO2 99%   BMI 52.71 kg/m  Wt Readings from Last 3 Encounters:  06/19/20 (!) 326 lb 9.6 oz (148.1 kg)  04/23/20 (!) 318 lb (144.2 kg)  04/04/20 (!) 312 lb (141.5 kg)     Lab Results  Component Value Date   WBC 2.7 (L) 10/08/2018   HGB 12.6 10/08/2018   HCT 39.1 10/08/2018   PLT 254 10/08/2018   GLUCOSE 100 (H) 02/14/2020   CHOL 187 02/14/2020   TRIG 44 02/14/2020   HDL 73 02/14/2020   LDLDIRECT 129.1 10/17/2009   LDLCALC 101 (H) 02/14/2020   ALT 29 02/14/2020   AST 21 02/14/2020   NA 142 02/14/2020   K 3.8 02/14/2020   CL 104 02/14/2020   CREATININE 0.98 02/14/2020   BUN 10 02/14/2020   CO2 29 02/14/2020   TSH 3.13 08/02/2019   INR 1.1 (H) 10/16/2014   HGBA1C 5.8 (H) 02/14/2020   MICROALBUR <0.7 04/07/2019    DG Bone Density  Result Date: 02/23/2020 EXAM: DUAL X-RAY ABSORPTIOMETRY (DXA) FOR BONE MINERAL DENSITY IMPRESSION: Tammy [name] Your patient Tammy [name] completed a BMD test on 02/23/2020 using the [place] (analysis version: 16.SP2) manufactured by [employer]. The following summarizes the results of our  evaluation. [facility] PATIENT: Name: Tammy, [name] Patient ID: [id number] Birth Date: 11-13-1960 Height: 65.0 in. Gender: Female Measured: 02/23/2020 Weight: 318.0 lbs. Indications: African-American, Diabetic, Estrogen Deficiency, Hysterectomy, Low Calcium Intake, Post Menopausal Fractures: Treatments: Multivitamin ASSESSMENT: The BMD measured at Femur Neck Left is 1.022 g/cm2 with a T-score of -0.1. This patient is considered normal according to [facility]) criteria. L-3 & 4 was excluded due to degenerative changes. The scan quality is good. Site Region Measured Date Measured Age WHO YA BMD Classification T-score AP  Spine L1-L2 02/23/2020 59.0 Normal 0.0 1.171 g/cm2 DualFemur Neck Left 2/23/2020 59.0 [age] Normal -0.1 1.022 g/cm2 World Health Organization [facility]) criteria for post-menopausal, Caucasian Women: Normal        T-score at or above -1 SD Low Bone Mass T-score between -1 and -2.5 SD Osteoporosis  T-score at or below -2.5 SD RECOMMENDATION:1. All patients should optimize calcium and vitamin D intake. 2. Consider FDA-approved medical therapies in postmenopausal women and men aged 39 years and older, based on the following: a. A hip or vertebral(clinical or morphometric) fracture. b. T-Score < -2.5 at the femoral neck or spine after appropriate evaluation to exclude secondary causes c. Low bone mass (T-score between -1.0 and -2.5 at the femoral neck or spine) and a 10 year probability of a hip fracture >3% or a 10 year probability of major osteoporosis-related fracture > 20% based on the [place]-adapted WHO algorithm d. Clinical judgement and/or patient preferences may indicate treatment for people with 10-year fracture probabilities above or below these levels FOLLOW-UP: Patients with diagnosis of osteoporosis or at high risk for fracture should have regular bone mineral density tests. For patients eligible for Medicare, routine testing is allowed once every 2 years. The testing frequency can be  increased to one year for patients who have rapidly progressing disease, those who are receiving or discontinuing medical therapy to restore bone mass, or have additional risk factors. I have reviewed this report and agree with the above findings. [facility] Radiology Electronically Signed   By: Rolm Baptise M.D.   On: 02/23/2020 15:19     Assessment & Plan:  Plan  I have changed Seham P. Pincus's pantoprazole. I am also having her maintain her glucose blood, Multiple Vitamins-Minerals (MULTIVITAMIN ADULT PO), NONFORMULARY OR COMPOUNDED ITEM, furosemide, loratadine, amLODipine, cyclobenzaprine, meloxicam, fluticasone, and carvedilol.  Meds ordered this encounter  Medications  . fluticasone (FLONASE) 50 MCG/ACT nasal spray    Sig: Place 1 spray into both nostrils in the morning and at bedtime.    Dispense:  1 g    Refill:  0  . carvedilol (COREG) 6.25 MG tablet    Sig: Take 1 tablet (6.25 mg total) by mouth 2 (two) times daily with a meal.    Dispense:  180 tablet    Refill:  3  . pantoprazole (PROTONIX) 40 MG tablet    Sig: Take 2 tablets (80 mg total) by mouth daily.    Dispense:  180 tablet    Refill:  3    Requesting 1 year supply    Problem List Items Addressed This Visit      Unprioritized   Essential hypertension    Well controlled, no changes to meds. Encouraged heart healthy diet such as the DASH diet and exercise as tolerated.       Relevant Medications   carvedilol (COREG) 6.25 MG tablet   Other Relevant Orders   Lipid panel   Hemoglobin A1c   Comprehensive metabolic panel   Hyperlipidemia LDL goal <70    Encouraged heart healthy diet, increase exercise, avoid trans fats, consider a krill oil cap daily      Relevant Medications   carvedilol (COREG) 6.25 MG tablet   Morbid obesity - BMI > 50    Pt has done great with weight loss Refer to pt for -- water therapy        Relevant Orders   Ambulatory referral to Physical Therapy    Other Visit Diagnoses     Hyperlipidemia, unspecified hyperlipidemia type    -  Primary   Relevant Medications   carvedilol (COREG) 6.25 MG tablet   Other Relevant Orders   Lipid panel   Comprehensive metabolic panel   Gastroesophageal reflux disease       Relevant Medications   pantoprazole (PROTONIX) 40 MG tablet   Primary osteoarthritis involving multiple joints       Relevant Orders   Ambulatory referral to Physical Therapy      Follow-up: Return in about 6 months (around 12/17/2020), or if symptoms worsen or fail to improve, for hypertension, fasting, annual exam.  [name], DO

## 2020-06-22 NOTE — Telephone Encounter (Signed)
Pt returned my call and left message for me to call her back.  I returned the call and gave pt benefit information.  Never Guarantee of coverage per insurance company.Covered @ 80% after 1500.00 deductible(Met248.75) so 438.00 would go towards deductible. Pt wants to think about it. She was also asking if these orthotics are like the ones she purchased from [name] and I explained that the custom ones are for her feet and will have more support for her. She also asked for an appt to see Dr Paulla Dolly to discuss the pain in the right foot on the right side of her fat pad. I scheduled her to see Dr Paulla Dolly 2.24

## 2020-06-28 ENCOUNTER — Encounter: Payer: Self-pay | Admitting: Podiatry

## 2020-06-28 ENCOUNTER — Other Ambulatory Visit: Payer: Self-pay

## 2020-06-28 ENCOUNTER — Ambulatory Visit (INDEPENDENT_AMBULATORY_CARE_PROVIDER_SITE_OTHER): Payer: 59 | Admitting: Podiatry

## 2020-06-28 DIAGNOSIS — M7671 Peroneal tendinitis, right leg: Secondary | ICD-10-CM | POA: Diagnosis not present

## 2020-06-28 MED ORDER — TRIAMCINOLONE ACETONIDE 10 MG/ML IJ SUSP
10.0000 mg | Freq: Once | INTRAMUSCULAR | Status: AC
  Administered 2020-06-28: 10 mg

## 2020-07-01 NOTE — Progress Notes (Signed)
Subjective:   Patient ID: Tammy [name], female   [name]: 60 y.o.   MRN: [id number]   HPI Patient states the heel is improving but the pain in the outside of the foot is still been bothering me that was not worked on   ROS      Objective:  Physical Exam  Neurovascular status intact with diminished discomfort sinus tarsi right obesity with collapsed arch noted with patient who does have a job that she needs to be on her foot with pain around the peroneal insertion     Assessment:  Patient with poor foot mechanics with significant obesity with discomfort of the fifth metatarsal base right with improved sinus tarsi right     Plan:  H&P reviewed condition and went ahead and injected the base of the fifth metatarsal right 3 mg dexamethasone Kenalog 5 mg Xylocaine and went ahead and educated on ice therapy anti-inflammatories

## 2020-07-02 ENCOUNTER — Ambulatory Visit: Payer: 59

## 2020-07-16 ENCOUNTER — Encounter: Payer: Self-pay | Admitting: Family Medicine

## 2020-08-11 ENCOUNTER — Encounter (HOSPITAL_BASED_OUTPATIENT_CLINIC_OR_DEPARTMENT_OTHER): Payer: Self-pay | Admitting: Emergency Medicine

## 2020-08-11 ENCOUNTER — Emergency Department (HOSPITAL_BASED_OUTPATIENT_CLINIC_OR_DEPARTMENT_OTHER): Payer: 59

## 2020-08-11 ENCOUNTER — Emergency Department (HOSPITAL_BASED_OUTPATIENT_CLINIC_OR_DEPARTMENT_OTHER)
Admission: EM | Admit: 2020-08-11 | Discharge: 2020-08-11 | Disposition: A | Discharge status: Home / Self Care | Payer: 59 | Attending: Emergency Medicine | Admitting: Emergency Medicine

## 2020-08-11 ENCOUNTER — Other Ambulatory Visit: Payer: Self-pay

## 2020-08-11 DIAGNOSIS — I10 Essential (primary) hypertension: Secondary | ICD-10-CM | POA: Insufficient documentation

## 2020-08-11 DIAGNOSIS — Z79899 Other long term (current) drug therapy: Secondary | ICD-10-CM | POA: Diagnosis not present

## 2020-08-11 DIAGNOSIS — E1159 Type 2 diabetes mellitus with other circulatory complications: Secondary | ICD-10-CM | POA: Diagnosis not present

## 2020-08-11 DIAGNOSIS — M25562 Pain in left knee: Secondary | ICD-10-CM | POA: Insufficient documentation

## 2020-08-11 MED ORDER — KETOROLAC TROMETHAMINE 15 MG/ML IJ SOLN
15.0000 mg | Freq: Once | INTRAMUSCULAR | Status: AC
  Administered 2020-08-11: 15 mg via INTRAMUSCULAR
  Filled 2020-08-11: qty 1

## 2020-08-11 MED ORDER — ACETAMINOPHEN 500 MG PO TABS
1000.0000 mg | ORAL_TABLET | Freq: Once | ORAL | Status: AC
  Administered 2020-08-11: 1000 mg via ORAL
  Filled 2020-08-11: qty 2

## 2020-08-11 MED ORDER — OXYCODONE HCL 5 MG PO TABS
5.0000 mg | ORAL_TABLET | Freq: Once | ORAL | Status: AC
Start: 2020-08-11 — End: 2020-08-11
  Administered 2020-08-11: 5 mg via ORAL
  Filled 2020-08-11: qty 1

## 2020-08-11 NOTE — Discharge Instructions (Signed)
Your x-ray did not show a broken bone.  Please try and keep your weight off of it until you are able to see your sports medicine doctor in the office.  There is a sports medicine doctor upstairs in this building if you wish to see them in the office I provided their information in your paperwork.  Please let your family doctor know how you are doing.  Take 4 over the counter ibuprofen tablets 3 times a day or 2 over-the-counter naproxen tablets twice a day for pain. Also take tylenol 1000mg (2 extra strength) four times a day.

## 2020-08-11 NOTE — ED Provider Notes (Signed)
[facility] DEPARTMENT Provider Note   CSN: [id number] Arrival date & time: 08/11/20  1151     History Chief Complaint  Patient presents with  . Knee Pain    Tammy [name] is a 60 y.o. female.  60 yo F with a chief complaint of left knee pain.  The patient was getting out of the car yesterday and twisted and felt a pop in her knee.  Having severe pain worse to the superior lateral aspect of the knee.  Pain with bending and twisting and palpation.  She denies overt trauma.  Has had problems with both of her knees in the past and has gotten the gel injections with improvement.  Never required knee surgery.  The history is provided by the patient and the spouse.  Knee Pain Location:  Knee Time since incident:  1 day Injury: yes   Mechanism of injury comment:  Twisting Knee location:  L knee Pain details:    Quality:  Aching and sharp   Radiates to:  Does not radiate   Severity:  Moderate   Onset quality:  Gradual   Duration:  1 day   Timing:  Constant   Progression:  Worsening Chronicity:  New Prior injury to area:  No Relieved by:  Nothing Worsened by:  Activity, adduction, bearing weight, extension and flexion Ineffective treatments:  None tried Associated symptoms: no fever        Past Medical History:  Diagnosis Date  . Acute on chronic appendicitis s/p lap appy 06/10/2012 06/10/2012   surgery done 06-10-12  . Arthritis    ankle  . Back pain   . Chest pain   . Complication of anesthesia   . Constipation   . Diabetes mellitus    TYPE 2- no meds in several months-was taken off meds -monitoring blood sugar.  Marland Kitchen GERD (gastroesophageal reflux disease)   . History of colon polyps   . Hyperlipidemia   . Hypertension   . Joint pain   . Lactose intolerance   . Morbid obesity - BMI > 50 12/13/2012  . Pneumonia    none recent  . PONV (postoperative nausea and vomiting)   . Poor venous access    "usually difficult stick"  . Prediabetes   .  Sinusitis   . SOB (shortness of breath)   . Swelling   . Vitamin D deficiency     Patient Active Problem List   Diagnosis Date Noted  . Chronic left-sided low back pain with left-sided sciatica 03/14/2020  . Hyperlipidemia associated with type 2 diabetes mellitus ([place]) 04/07/2019  . Uncontrolled type 2 diabetes mellitus with hyperglycemia ([place]) 04/07/2019  . Neck pain 04/07/2019  . Chronic seasonal allergic rhinitis due to pollen 04/07/2019  . Lymphedema 03/04/2019  . Edema due to malnutrition ([place]) 10/10/2018  . Class 3 severe obesity with serious comorbidity and body mass index (BMI) of 50.0 to 59.9 in adult ([place]) 08/30/2018  . Lower extremity edema 07/04/2015  . Hoarseness 06/15/2015  . Bronchospasm with bronchitis, acute 05/24/2015  . Elevated CA-125 03/16/2015  . 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 02/27/2015  . Enteritis 01/25/2015  . Ascites 01/25/2015  . Abdominal pain 01/22/2015  . Ovarian cyst, left 01/03/2015  . Allergic rhinitis 12/21/2013  . Right ankle pain 11/10/2013  . Morbid obesity - BMI > 50 12/13/2012  . Chronic vomiting 12/13/2012  . Lactose intolerance 10/22/2012  . Left knee pain 07/26/2012  . Bacterial vaginosis 06/10/2012  . Bradycardia,  sinus 03/28/2012  . Central positional vertigo 03/27/2012  . Tongue swelling 08/21/2011  . Urgency of urination 05/14/2011  . Personal history of colonic polyps 04/11/2011  . Otalgia 11/14/2010  . OTHER TENOSYNOVITIS OF HAND AND WRIST 07/19/2010  . DYSPEPSIA 10/17/2009  . BACK PAIN, LUMBAR, WITH RADICULOPATHY 12/27/2008  . VITAMIN B12 DEFICIENCY 09/29/2008  . SYNCOPE 08/21/2008  . ANKLE EDEMA, CHRONIC 12/16/2006  . DM (diabetes mellitus) type II, controlled, with peripheral vascular disorder ([place]) 11/09/2006  . Hyperlipidemia LDL goal <70 11/09/2006  . Essential hypertension 05/26/2006  . GERD 05/26/2006    Past Surgical History:  Procedure Laterality Date  . ABDOMINAL HYSTERECTOMY    . APPENDECTOMY      2'14  . COLONOSCOPY  04/11/11   5 mm polyp removed but not recovered  . COLONOSCOPY WITH PROPOFOL N/A 05/11/2017   Procedure: COLONOSCOPY WITH PROPOFOL;  Surgeon: Gatha Mayer, MD;  Location: [facility];  Service: Endoscopy;  Laterality: N/A;  . GASTRIC ROUX-EN-Y N/A 02/27/2015   Procedure: LAPAROSCOPIC ROUX-EN-Y GASTRIC BYPASS WITH UPPER ENDOSCOPY;  Surgeon: Johnathan Hausen, MD;  Location: [facility];  Service: General;  Laterality: N/A;  . KNEE SURGERY Right     KNEE SURGERY   . LAPAROSCOPIC APPENDECTOMY  06/10/2012   Procedure: APPENDECTOMY LAPAROSCOPIC;  Surgeon: Adin Hector, MD;  Location: [facility];  Service: General;  Laterality: N/A;  . UPPER GASTROINTESTINAL ENDOSCOPY  04/14/2006   Normal     OB History    Gravida  1   Para  1   Term      Preterm      AB      Living        SAB      IAB      Ectopic      Multiple      Live Births              Family History  Problem Relation Age of Onset  . Hyperlipidemia Mother   . Hypertension Mother   . Cancer Mother 58       hodgkins lymphoma  . Diabetes Father   . Heart failure Father   . Heart disease Father   . Hyperlipidemia Brother   . Diabetes Paternal Grandmother   . Breast cancer Paternal Aunt        unsure of age  . Breast cancer Paternal Aunt   . Colon cancer Neg Hx   . Heart attack Neg Hx   . Sudden death Neg Hx   . Colon polyps Neg Hx   . Esophageal cancer Neg Hx   . Stomach cancer Neg Hx   . Rectal cancer Neg Hx     Social History   Tobacco Use  . Smoking status: Never Smoker  . Smokeless tobacco: Never Used  Vaping Use  . Vaping Use: Never used  Substance Use Topics  . Alcohol use: No    Alcohol/week: 0.0 standard drinks  . Drug use: No    Home Medications Prior to Admission medications   Medication Sig Start Date End Date Taking? Authorizing Provider  amLODipine (NORVASC) 2.5 MG tablet Take 1 tablet (2.5 mg total) by mouth 2 (two) times daily. 03/13/20   [name], DO   carvedilol (COREG) 6.25 MG tablet Take 1 tablet (6.25 mg total) by mouth 2 (two) times daily with a meal. 06/19/20   Carollee Herter, Alferd Apa, DO  COVID-19 mRNA vaccine, Pfizer, 30 MCG/0.3ML injection INJECT AS DIRECTED 05/01/20  05/01/21  Carlyle Basques, MD  cyclobenzaprine (FLEXERIL) 10 MG tablet Take 1 tablet (10 mg total) by mouth 3 (three) times daily as needed for muscle spasms. 03/13/20   [name], DO  fluticasone (FLONASE) 50 MCG/ACT nasal spray Place 1 spray into both nostrils in the morning and at bedtime. 06/19/20   [name], DO  furosemide (LASIX) 40 MG tablet Take 1 tablet (40 mg total) by mouth daily. 03/04/19   Roma Schanz R, DO  glucose blood test strip Check blood sugar twice daily 11/28/16   Carollee Herter, Alferd Apa, DO  loratadine (CLARITIN) 10 MG tablet Take 1 tablet (10 mg total) by mouth daily. 04/07/19   [name], DO  meloxicam (MOBIC) 15 MG tablet Take 1 tablet daily with food for 14 days. Then take as needed. 04/04/20   Nuala Alpha, DO  Multiple Vitamins-Minerals (MULTIVITAMIN ADULT PO) Take 1 tablet by mouth daily. flintstone vitamins    [provider]  NONFORMULARY OR COMPOUNDED ITEM Thigh high compression socks  20-30 mmhg    Re- low ext edema 03/02/18   Carollee Herter, Alferd Apa, DO  pantoprazole (PROTONIX) 40 MG tablet Take 2 tablets (80 mg total) by mouth daily. 06/19/20   [name], DO    Allergies    Ace inhibitors, Cheese, Red dye, and Tuna [fish allergy]  Review of Systems   Review of Systems  Constitutional: Negative for chills and fever.  HENT: Negative for congestion and rhinorrhea.   Eyes: Negative for redness and visual disturbance.  Respiratory: Negative for shortness of breath and wheezing.   Cardiovascular: Negative for chest pain and palpitations.  Gastrointestinal: Negative for nausea and vomiting.  Genitourinary: Negative for dysuria and urgency.  Musculoskeletal: Positive for  arthralgias. Negative for myalgias.  Skin: Negative for pallor and wound.  Neurological: Negative for dizziness and headaches.    Physical Exam Updated Vital Signs BP (!) 151/76 (BP Location: Right Arm)   Pulse 68   Temp 97.9 F (36.6 C) (Oral)   Resp 18   Ht 5\' 6"  (1.676 m)   Wt (!) 150.6 kg   SpO2 100%   BMI 53.59 kg/m   Physical Exam Vitals and nursing note reviewed.  Constitutional:      General: She is not in acute distress.    Appearance: She is well-developed. She is not diaphoretic.     Comments: BMI 54  HENT:     Head: Normocephalic and atraumatic.  Eyes:     Pupils: Pupils are equal, round, and reactive to light.  Cardiovascular:     Rate and Rhythm: Normal rate and regular rhythm.     Heart sounds: No murmur heard. No friction rub. No gallop.   Pulmonary:     Effort: Pulmonary effort is normal.     Breath sounds: No wheezing or rales.  Abdominal:     General: There is no distension.     Palpations: Abdomen is soft.     Tenderness: There is no abdominal tenderness.  Musculoskeletal:        General: Tenderness present.     Cervical back: Normal range of motion and neck supple.     Comments: Difficult exam due to body habitus.  Pain appears to be to the superior lateral aspect of the knee.  Pain with range of motion.  Able to straight leg raise off the bed.  Pulse motor and sensation intact distally.  Skin:    General:  Skin is warm and dry.  Neurological:     Mental Status: She is alert and oriented to person, place, and time.  Psychiatric:        Behavior: Behavior normal.     ED Results / Procedures / Treatments   Labs (all labs ordered are listed, but only abnormal results are displayed) Labs Reviewed - No data to display  EKG None  Radiology DG Knee Complete 4 Views Left  Result Date: 08/11/2020 CLINICAL DATA:  Injury to the LEFT knee yesterday when getting out of her car, associated with an audible pop. Patient is currently unable to  weightbear. Initial encounter. EXAM: LEFT KNEE - COMPLETE 4+ VIEW COMPARISON:  None. FINDINGS: No evidence of acute fracture or dislocation. Mild patellofemoral compartment joint space narrowing and associated spurring. MEDIAL and LATERAL compartment joint spaces well preserved. No intrinsic osseous abnormalities. IMPRESSION: 1. No acute osseous abnormality. 2. Mild patellofemoral compartment osteoarthritis. Electronically Signed   By: Evangeline Dakin M.D.   On: 08/11/2020 13:22    Procedures Procedures   Medications Ordered in ED Medications  acetaminophen (TYLENOL) tablet 1,000 mg (1,000 mg Oral Given 08/11/20 1253)  oxyCODONE (Oxy IR/ROXICODONE) immediate release tablet 5 mg (5 mg Oral Given 08/11/20 1253)  ketorolac (TORADOL) 15 MG/ML injection 15 mg (15 mg Intramuscular Given 08/11/20 1253)    ED Course  I have reviewed the triage vital signs and the nursing notes.  Pertinent labs & imaging results that were available during my care of the patient were reviewed by me and considered in my medical decision making (see chart for details).    MDM Rules/Calculators/A&P                          60 yo F with a chief complaint of left knee pain.  Patient had a twisting injury yesterday.  Difficult exam, unable to range no obvious edema.  Pulse motor and sensation intact distally.  Likely internal derangement.  Not obvious quadriceps tendon disruption.    Plain film viewed by me without fracture or dislocation.  Unfortunately due to body habitus we are unable to place the patient in a knee immobilizer.  We will do an Ace wrap.  Crutches.  Sports medicine follow-up.  1:27 PM:  I have discussed the diagnosis/risks/treatment options with the patient and family and believe the pt to be eligible for discharge home to follow-up with Sports Med. We also discussed returning to the ED immediately if new or worsening sx occur. We discussed the sx which are most concerning (e.g., sudden worsening pain, fever,  inability to tolerate by mouth) that necessitate immediate return. Medications administered to the patient during their visit and any new prescriptions provided to the patient are listed below.  Medications given during this visit Medications  acetaminophen (TYLENOL) tablet 1,000 mg (1,000 mg Oral Given 08/11/20 1253)  oxyCODONE (Oxy IR/ROXICODONE) immediate release tablet 5 mg (5 mg Oral Given 08/11/20 1253)  ketorolac (TORADOL) 15 MG/ML injection 15 mg (15 mg Intramuscular Given 08/11/20 1253)     The patient appears reasonably screen and/or stabilized for discharge and I doubt any other medical condition or other [facility] requiring further screening, evaluation, or treatment in the ED at this time prior to discharge.   Final Clinical Impression(s) / ED Diagnoses Final diagnoses:  Acute pain of left knee    Rx / DC Orders ED Discharge Orders    None       Deno Etienne,  DO 08/11/20 1327

## 2020-08-11 NOTE — ED Triage Notes (Signed)
Pt reports when getting out of car yesterday, she twisted her left knee and heard a pop.Couldnt put any pressure on leg.

## 2020-08-13 ENCOUNTER — Encounter: Payer: Self-pay | Admitting: Family Medicine

## 2020-08-13 ENCOUNTER — Telehealth: Payer: Self-pay

## 2020-08-13 ENCOUNTER — Ambulatory Visit (INDEPENDENT_AMBULATORY_CARE_PROVIDER_SITE_OTHER): Payer: 59 | Admitting: Family Medicine

## 2020-08-13 ENCOUNTER — Other Ambulatory Visit: Payer: Self-pay

## 2020-08-13 VITALS — BP 140/82 | HR 69 | Temp 98.1°F | Resp 18 | Ht 66.0 in | Wt 330.6 lb

## 2020-08-13 DIAGNOSIS — M25562 Pain in left knee: Secondary | ICD-10-CM | POA: Diagnosis not present

## 2020-08-13 MED ORDER — TRAMADOL HCL 50 MG PO TABS: 50.0000 mg | ORAL_TABLET | Freq: Three times a day (TID) | ORAL | 0 refills | Status: AC | PRN

## 2020-08-13 NOTE — Patient Instructions (Signed)
Acute Knee Pain, Adult Acute knee pain is sudden and may be caused by damage, swelling, or irritation of the muscles and tissues that support the knee. Pain may result from:  A fall.  An injury to the knee from twisting motions.  A hit to the knee.  Infection. Acute knee pain may go away on its own with time and rest. If it does not, your health care provider may order tests to find the cause of the pain. These may include:  Imaging tests, such as an X-ray, MRI, CT scan, or ultrasound.  Joint aspiration. In this test, fluid is removed from the knee and evaluated.  Arthroscopy. In this test, a lighted tube is inserted into the knee and an image is projected onto a TV screen.  Biopsy. In this test, a sample of tissue is removed from the body and studied under a microscope. Follow these instructions at home: If you have a knee sleeve or brace:  Wear the knee sleeve or brace as told by your health care provider. Remove it only as told by your health care provider.  Loosen it if your toes tingle, become numb, or turn cold and blue.  Keep it clean.  If the knee sleeve or brace is not waterproof: ? Do not let it get wet. ? Cover it with a watertight covering when you take a bath or shower.   Activity  Rest your knee.  Do not do things that cause pain or make pain worse.  Avoid high-impact activities or exercises, such as running, jumping rope, or doing jumping jacks.  Work with a physical therapist to make a safe exercise program, as recommended by your health care provider. Do exercises as told by your physical therapist. Managing pain, stiffness, and swelling  If directed, put ice on the affected knee. To do this: ? If you have a removable knee sleeve or brace, remove it as told by your health care provider. ? Put ice in a plastic bag. ? Place a towel between your skin and the bag. ? Leave the ice on for 20 minutes, 2-3 times a day. ? Remove the ice if your skin turns bright  red. This is very important. If you cannot feel pain, heat, or cold, you have a greater risk of damage to the area.  If directed, use an elastic bandage to put pressure (compression) on your injured knee. This may control swelling, give support, and help with discomfort.  Raise (elevate) your knee above the level of your heart while you are sitting or lying down.  Sleep with a pillow under your knee.   General instructions  Take over-the-counter and prescription medicines only as told by your health care provider.  Do not use any products that contain nicotine or tobacco, such as cigarettes, e-cigarettes, and chewing tobacco. If you need help quitting, ask your health care provider.  If you are overweight, work with your health care provider and a dietitian to set a weight-loss goal that is healthy and reasonable for you. Extra weight can put pressure on your knee.  Pay attention to any changes in your symptoms.  Keep all follow-up visits. This is important. Contact a health care provider if:  Your knee pain continues, changes, or gets worse.  You have a fever along with knee pain.  Your knee feels warm to the touch or is red.  Your knee buckles or locks up. Get help right away if:  Your knee swells, and the swelling becomes   worse.  You cannot move your knee.  You have severe pain in your knee that cannot be managed with pain medicine. Summary  Acute knee pain can be caused by a fall, an injury, an infection, or damage, swelling, or irritation of the tissues that support your knee.  Your health care provider may perform tests to find out the cause of the pain.  Pay attention to any changes in your symptoms. Relieve your pain with rest, medicines, light activity, and the use of ice.  Get help right away if your knee swells, you cannot move your knee, or you have severe pain that cannot be managed with medicine. This information is not intended to replace advice given to you  by your health care provider. Make sure you discuss any questions you have with your health care provider. Document Revised: 10/05/2019 Document Reviewed: 10/05/2019 Elsevier Patient Education  2021 [employer]  

## 2020-08-13 NOTE — Telephone Encounter (Signed)
Nurse Assessment Nurse: Leilani Merl, RN, Heather Date/Time (Eastern Time): 08/11/2020 10:06:29 AM Confirm and document reason for call. If symptomatic, describe symptoms. ---Caller states she got out of her car and her knee popped. She felt a lot of pressure and now she cannot put pressure on it. She does not know what to do. It happened last night. Does the patient have any new or worsening symptoms? ---Yes Will a triage be completed? ---Yes Related visit to physician within the last 2 weeks? ---No Does the PT have any chronic conditions? (i.e. diabetes, asthma, this includes High risk factors for pregnancy, etc.) ---Yes List chronic conditions. ---HTN, reflux, allergies Is this a behavioral health or substance abuse call? ---No Guidelines Guideline Title Affirmed Question Affirmed Notes Nurse Date/Time (Eastern Time) Knee Injury A "snap" or "pop" was heard at the time of injury Standifer, RN, Heather 08/11/2020 10:06:48 AM Disp. Time Eilene Ghazi Time) Disposition Final User 08/11/2020 10:09:53 AM See PCP within 24 Hours Yes Standifer, RN, Nira Conn PLEASE NOTE: All timestamps contained within this report are represented as [place] Standard Time. CONFIDENTIALTY NOTICE: This fax transmission is intended only for the addressee. It contains information that is legally privileged, confidential or otherwise protected from use or disclosure. If you are not the intended recipient, you are strictly prohibited from reviewing, disclosing, copying using or disseminating any of this information or taking any action in reliance on or regarding this information. If you have received this fax in error, please notify us immediately by telephone so that we can arrange for its return to [name]. Phone: [phone], Toll-Free: [phone], Fax: [phone] Page: 2 of 2 Call Id: [id number] [place] Disagree/Comply Comply Caller Understands Yes PreDisposition Call Doctor Care Advice Given Per Guideline SEE PCP  WITHIN 24 HOURS: CALL BACK IF: * You become worse CARE ADVICE given per Knee Injury (Adult) guideline. Referrals GO TO FACILITY UNDECIDED   Pt seen in ED.

## 2020-08-13 NOTE — Progress Notes (Signed)
xPatient ID: Tammy [name], female    [name]: 15-Feb-1961  Age: 60 y.o. MRN: [id number]    Subjective:  Subjective  HPI Tammy [name] presents for an office visit today. She complains of left knee pain. She describes the pain as aching and pulling.She notes on 08/11/2020 she had twisted her knee and was taken to the ED. She was given 5 mg of Oxycodonex2 pills to take home, which resulted in her being nauseated, however it relieved her pain temporarily. She reports taking Tylenol after the ED visit to relieve her pain, however it slightly relieve the symptoms. She endorse putting bandage on the knee, and it had relieve her pain slightly. She denies any chest pain, SOB, fever, abdominal pain, cough, chills, sore throat, dysuria, urinary incontinence, back pain, HA, or V/D at this time.  They gave her a shot of toradol which helped for the day only.   Pt is walking with a cane right now.  Xray done in er showed no abnormalities Review of Systems  Constitutional: Negative for chills, fatigue and fever.  HENT: Negative for congestion, hearing loss, sinus pressure, sinus pain and sore throat.   Eyes: Negative for pain.  Respiratory: Negative for cough, shortness of breath and wheezing.   Cardiovascular: Negative for chest pain, palpitations and leg swelling.  Gastrointestinal: Positive for nausea (Secondary to medication from ED). Negative for abdominal pain, blood in stool, constipation, diarrhea and vomiting.  Genitourinary: Negative for dysuria, frequency, hematuria and urgency.  Musculoskeletal: Negative for back pain.       (+) left knee pain   Neurological: Negative for dizziness and headaches.    History Past Medical History:  Diagnosis Date  . Acute on chronic appendicitis s/p lap appy 06/10/2012 06/10/2012   surgery done 06-10-12  . Arthritis    ankle  . Back pain   . Chest pain   . Complication of anesthesia   . Constipation   . Diabetes mellitus    TYPE 2- no meds in several  months-was taken off meds -monitoring blood sugar.  Marland Kitchen GERD (gastroesophageal reflux disease)   . History of colon polyps   . Hyperlipidemia   . Hypertension   . Joint pain   . Lactose intolerance   . Morbid obesity - BMI > 50 12/13/2012  . Pneumonia    none recent  . PONV (postoperative nausea and vomiting)   . Poor venous access    "usually difficult stick"  . Prediabetes   . Sinusitis   . SOB (shortness of breath)   . Swelling   . Vitamin D deficiency     She has a past surgical history that includes Abdominal hysterectomy; Knee surgery (Right); Upper gastrointestinal endoscopy (04/14/2006); Colonoscopy (04/11/11); laparoscopic appendectomy (06/10/2012); Appendectomy; Gastric Roux-En-Y (N/A, 02/27/2015); and Colonoscopy with propofol (N/A, 05/11/2017).   Her family history includes Breast cancer in her paternal aunt and paternal aunt; Cancer (age of onset: 9) in her mother; Diabetes in her father and paternal grandmother; Heart disease in her father; Heart failure in her father; Hyperlipidemia in her brother and mother; Hypertension in her mother.She reports that she has never smoked. She has never used smokeless tobacco. She reports that she does not drink alcohol and does not use drugs.  Current Outpatient Medications on File Prior to Visit  Medication Sig Dispense Refill  . amLODipine (NORVASC) 2.5 MG tablet Take 1 tablet (2.5 mg total) by mouth 2 (two) times daily. 180 tablet 3  . carvedilol (COREG) 6.25 MG tablet  Take 1 tablet (6.25 mg total) by mouth 2 (two) times daily with a meal. 180 tablet 3  . COVID-19 mRNA vaccine, Pfizer, 30 MCG/0.3ML injection INJECT AS DIRECTED .3 mL 0  . cyclobenzaprine (FLEXERIL) 10 MG tablet Take 1 tablet (10 mg total) by mouth 3 (three) times daily as needed for muscle spasms. 30 tablet 1  . fluticasone (FLONASE) 50 MCG/ACT nasal spray Place 1 spray into both nostrils in the morning and at bedtime. 1 g 0  . furosemide (LASIX) 40 MG tablet Take 1 tablet  (40 mg total) by mouth daily. 90 tablet 3  . glucose blood test strip Check blood sugar twice daily 100 each 11  . loratadine (CLARITIN) 10 MG tablet Take 1 tablet (10 mg total) by mouth daily. 90 tablet 3  . meloxicam (MOBIC) 15 MG tablet Take 1 tablet daily with food for 14 days. Then take as needed. 30 tablet 1  . Multiple Vitamins-Minerals (MULTIVITAMIN ADULT PO) Take 1 tablet by mouth daily. flintstone vitamins    . NONFORMULARY OR COMPOUNDED ITEM Thigh high compression socks  20-30 mmhg    Re- low ext edema 1 each 1  . pantoprazole (PROTONIX) 40 MG tablet Take 2 tablets (80 mg total) by mouth daily. 180 tablet 3   No current facility-administered medications on file prior to visit.     Objective:  Objective  Physical Exam Vitals and nursing note reviewed.  Constitutional:      General: She is not in acute distress.    Appearance: Normal appearance. She is well-developed. She is not ill-appearing.  HENT:     Head: Normocephalic and atraumatic.     Right Ear: External ear normal.     Left Ear: External ear normal.     Nose: Nose normal.  Eyes:     Extraocular Movements: Extraocular movements intact.     Pupils: Pupils are equal, round, and reactive to light.  Cardiovascular:     Rate and Rhythm: Normal rate and regular rhythm.     Pulses: Normal pulses.     Heart sounds: Normal heart sounds. No murmur heard. No friction rub. No gallop.   Pulmonary:     Effort: Pulmonary effort is normal. No respiratory distress.     Breath sounds: Normal breath sounds. No wheezing, rhonchi or rales.  Chest:     Chest wall: No tenderness.  Abdominal:     General: Bowel sounds are normal. There is no distension.     Palpations: Abdomen is soft.     Tenderness: There is no abdominal tenderness. There is no guarding or rebound.     Hernia: No hernia is present.  Musculoskeletal:        General: Normal range of motion.     Cervical back: Normal range of motion and neck supple.     Left  knee: Tenderness present.     Comments: There is tenderness present in the left medial knee. And pain was severe Difficult to assess due to body habitus Pt is able to walk with a cane   Skin:    General: Skin is warm and dry.  Neurological:     Mental Status: She is alert and oriented to person, place, and time.  Psychiatric:        Behavior: Behavior normal.        Thought Content: Thought content normal.    BP 140/82 (BP Location: Left Arm, Patient Position: Sitting, Cuff Size: Large)   Pulse 69   Temp  98.1 F (36.7 C) (Oral)   Resp 18   Ht 5\' 6"  (1.676 m)   Wt (!) 330 lb 9.6 oz (150 kg)   SpO2 98%   BMI 53.36 kg/m  Wt Readings from Last 3 Encounters:  08/13/20 (!) 330 lb 9.6 oz (150 kg)  08/11/20 (!) 332 lb 0.2 oz (150.6 kg)  06/19/20 (!) 326 lb 9.6 oz (148.1 kg)     Lab Results  Component Value Date   WBC 2.7 (L) 10/08/2018   HGB 12.6 10/08/2018   HCT 39.1 10/08/2018   PLT 254 10/08/2018   GLUCOSE 93 06/19/2020   CHOL 178 06/19/2020   TRIG 36.0 06/19/2020   HDL 70.20 06/19/2020   LDLDIRECT 129.1 10/17/2009   LDLCALC 101 (H) 06/19/2020   ALT 25 06/19/2020   AST 16 06/19/2020   NA 140 06/19/2020   K 3.8 06/19/2020   CL 105 06/19/2020   CREATININE 0.97 06/19/2020   BUN 17 06/19/2020   CO2 31 06/19/2020   TSH 3.13 08/02/2019   INR 1.1 (H) 10/16/2014   HGBA1C 5.9 06/19/2020   MICROALBUR <0.7 04/07/2019    DG Knee Complete 4 Views Left  Result Date: 08/11/2020 CLINICAL DATA:  Injury to the LEFT knee yesterday when getting out of her car, associated with an audible pop. Patient is currently unable to weightbear. Initial encounter. EXAM: LEFT KNEE - COMPLETE 4+ VIEW COMPARISON:  None. FINDINGS: No evidence of acute fracture or dislocation. Mild patellofemoral compartment joint space narrowing and associated spurring. MEDIAL and LATERAL compartment joint spaces well preserved. No intrinsic osseous abnormalities. IMPRESSION: 1. No acute osseous abnormality. 2. Mild  patellofemoral compartment osteoarthritis. Electronically Signed   By: Evangeline Dakin M.D.   On: 08/11/2020 13:22     Assessment & Plan:  Plan    Meds ordered this encounter  Medications  . traMADol (ULTRAM) 50 MG tablet    Sig: Take 1 tablet (50 mg total) by mouth every 8 (eight) hours as needed for up to 5 days.    Dispense:  15 tablet    Refill:  0    Problem List Items Addressed This Visit   None   Visit Diagnoses    Pain in lateral portion of left knee    -  Primary   Relevant Medications   traMADol (ULTRAM) 50 MG tablet   Other Relevant Orders   Ambulatory referral to Orthopedic Surgery    con't with ace  ,  Use ice , elevate leg Referral placed for ortho but pt also told about [facility] if needed   Follow-up: Return if symptoms worsen or fail to improve.   I,Gordon Zheng,acting as a Education administrator for [employer], DO.,have documented all relevant documentation on the behalf of [name], DO,as directed by  [name], DO while in the presence of [facility], DO, have reviewed all documentation for this visit. The documentation on 08/13/20 for the exam, diagnosis, procedures, and orders are all accurate and complete.

## 2020-08-15 ENCOUNTER — Encounter: Payer: Self-pay | Admitting: Family Medicine

## 2020-08-22 ENCOUNTER — Other Ambulatory Visit: Payer: Self-pay

## 2020-08-22 ENCOUNTER — Ambulatory Visit
Admission: RE | Admit: 2020-08-22 | Discharge: 2020-08-22 | Disposition: A | Discharge status: Home / Self Care | Payer: 59 | Source: Ambulatory Visit | Attending: Family Medicine | Admitting: Family Medicine

## 2020-08-22 DIAGNOSIS — Z1231 Encounter for screening mammogram for malignant neoplasm of breast: Secondary | ICD-10-CM

## 2020-08-27 ENCOUNTER — Ambulatory Visit: Payer: 59 | Admitting: Family Medicine

## 2020-09-05 ENCOUNTER — Encounter: Payer: Self-pay | Admitting: Family Medicine

## 2020-09-05 DIAGNOSIS — E1151 Type 2 diabetes mellitus with diabetic peripheral angiopathy without gangrene: Secondary | ICD-10-CM

## 2020-09-05 MED ORDER — GLUCOSE BLOOD VI STRP: ORAL_STRIP | 11 refills | Status: DC

## 2020-09-05 MED ORDER — FLUTICASONE PROPIONATE 50 MCG/ACT NA SUSP: 1.0000 | Freq: Two times a day (BID) | NASAL | 0 refills | Status: DC

## 2020-09-25 ENCOUNTER — Encounter: Payer: Self-pay | Admitting: Family Medicine

## 2020-09-25 ENCOUNTER — Other Ambulatory Visit: Payer: Self-pay | Admitting: Family Medicine

## 2020-09-25 DIAGNOSIS — E43 Unspecified severe protein-calorie malnutrition: Secondary | ICD-10-CM

## 2020-09-25 DIAGNOSIS — I10 Essential (primary) hypertension: Secondary | ICD-10-CM

## 2020-09-25 MED ORDER — CARVEDILOL 6.25 MG PO TABS: 6.2500 mg | ORAL_TABLET | Freq: Two times a day (BID) | ORAL | 1 refills | Status: DC

## 2020-10-08 ENCOUNTER — Ambulatory Visit: Payer: 59 | Admitting: Podiatry

## 2020-10-17 ENCOUNTER — Other Ambulatory Visit: Payer: Self-pay

## 2020-10-17 ENCOUNTER — Ambulatory Visit (INDEPENDENT_AMBULATORY_CARE_PROVIDER_SITE_OTHER): Payer: 59 | Admitting: Podiatry

## 2020-10-17 ENCOUNTER — Encounter: Payer: Self-pay | Admitting: Podiatry

## 2020-10-17 DIAGNOSIS — M778 Other enthesopathies, not elsewhere classified: Secondary | ICD-10-CM

## 2020-10-17 MED ORDER — TRIAMCINOLONE ACETONIDE 10 MG/ML IJ SUSP
20.0000 mg | Freq: Once | INTRAMUSCULAR | Status: AC
Start: 2020-10-17 — End: 2020-10-17
  Administered 2020-10-17: 20 mg

## 2020-10-17 NOTE — Progress Notes (Signed)
Subjective:   Patient ID: Tammy [name], female   [name]: 60 y.o.   MRN: [id number]   HPI Patient presents stating she has a lot of pain in her left forefoot and states this seems to move around between the right and left feet inside and outside pointing to both the second and third metatarsal phalangeal joints left   ROS      Objective:  Physical Exam  Neuro vascular status intact with extreme obesity and lymphedema with large legs causing pressure on her feet bilateral with inflammation of the second and third metatarsal phalangeal joint left     Assessment:  Acute capsulitis second and third metatarsal phalangeal joints left     Plan:  H&P reviewed condition and recommended aggressive conservative treatment did sterile prep and injected the second and third MPJ both joints separately with a combination of dexamethasone Kenalog 5 cc periarticular and fashion.  Reappoint as symptoms indicate

## 2020-11-14 ENCOUNTER — Encounter: Payer: Self-pay | Admitting: Family Medicine

## 2020-11-14 ENCOUNTER — Ambulatory Visit (INDEPENDENT_AMBULATORY_CARE_PROVIDER_SITE_OTHER): Payer: 59 | Admitting: Family Medicine

## 2020-11-14 ENCOUNTER — Other Ambulatory Visit: Payer: Self-pay

## 2020-11-14 VITALS — BP 125/60 | Ht 66.0 in | Wt 320.0 lb

## 2020-11-14 DIAGNOSIS — M25562 Pain in left knee: Secondary | ICD-10-CM

## 2020-11-14 DIAGNOSIS — M25561 Pain in right knee: Secondary | ICD-10-CM | POA: Diagnosis not present

## 2020-11-14 NOTE — Progress Notes (Signed)
   Tammy [name] is a 60 y.o. female who presents to [facility] today for the following:  B/l knee pain Patient with recurrence of chronic bilateral knee pain that began in the last 1 to 2 weeks Pain is similar to prior, occasionally sharp pain that it worsens with sitting for long time and walking. Reports that the left knee "gives out" of the right knee "goes backwards". Has not had any falls Pain does wake patient from sleep if she moves in the wrong way. Not much that improves the pain, she does take 2 Motrin when it is at its worst. Previously completed the knee gel shots in 2020 with reported improvement. States was seen 2 months ago by Ortho when her right ankle "moved the wrong way" and caused more pain in her knee and she was given a shot but unsure of which one.   PMH reviewed.  ROS as above. Medications reviewed.  Exam:  BP 125/60   Ht 5\' 6"  (1.676 m)   Wt (!) 320 lb (145.2 kg)   BMI 51.65 kg/m  Gen: Well NAD MSK:  Bilateral Knees: - Inspection: no gross deformity bilaterally. Difficult to discern effusion.  No erythema or bruising, difficult to assess due to obesity bilaterally. Skin intact bilaterally - Palpation: some medial and lateral joint line tenderness of the bilateral knees (though worse on the right knee). - ROM: with with active extension of b/l knees. No pain with other ROM movements bilaterally  - Strength: 5/5 strength bilaterally - Neuro/vasc: NV intact bilaterally - Special Tests: - LIGAMENTS: negative anterior and posterior drawer, negative Lachman's, no MCL or LCL laxity  -- MENISCUS: mildly positive McMurray's on the right, normal McMurray's on the left  -- PF JOINT: nml patellar mobility bilaterally.   Hips: normal ROM, pain with FABER bilaterally (worse on the right). Normal FADIR bilaterally   No results found.   Assessment and Plan: 1) acute on chronic knee pain.  Patient has a history of osteoarthritis, and previously completed gel  injections in August 2020 with improvement in symptoms.  Recent flare of symptoms in the last 8 days or so, physical exam overall reassuring but likely due to osteoarthritis.  Patient would benefit from weight loss as well as stretching exercises and continued walking.  We will plan to set up the patient for getting gel injections at next visit.  Chianna Spirito, DO

## 2020-11-21 ENCOUNTER — Encounter: Payer: Self-pay | Admitting: Family Medicine

## 2020-11-21 ENCOUNTER — Other Ambulatory Visit: Payer: Self-pay

## 2020-11-21 ENCOUNTER — Ambulatory Visit (INDEPENDENT_AMBULATORY_CARE_PROVIDER_SITE_OTHER): Payer: 59 | Admitting: Family Medicine

## 2020-11-21 DIAGNOSIS — M1711 Unilateral primary osteoarthritis, right knee: Secondary | ICD-10-CM | POA: Diagnosis not present

## 2020-11-21 NOTE — Progress Notes (Signed)
PCP: [name], DO  Subjective:   HPI: Patient is a 60 y.o. female here for viscosupplementation.  Patient returns to start visco series for her knees - opted to do only right knee today. Has done well with these in the past. No new complaints.  Past Medical History:  Diagnosis Date   Acute on chronic appendicitis s/p lap appy 06/10/2012 06/10/2012   surgery done 06-10-12   Arthritis    ankle   Back pain    Chest pain    Complication of anesthesia    Constipation    Diabetes mellitus    TYPE 2- no meds in several months-was taken off meds -monitoring blood sugar.   GERD (gastroesophageal reflux disease)    History of colon polyps    Hyperlipidemia    Hypertension    Joint pain    Lactose intolerance    Morbid obesity - BMI > 50 12/13/2012   Pneumonia    none recent   PONV (postoperative nausea and vomiting)    Poor venous access    "usually difficult stick"   Prediabetes    Sinusitis    SOB (shortness of breath)    Swelling    Vitamin D deficiency     Current Outpatient Medications on File Prior to Visit  Medication Sig Dispense Refill   carvedilol (COREG) 6.25 MG tablet TAKE 1 TABLET BY MOUTH  TWICE DAILY WITH MEALS 180 tablet 1   furosemide (LASIX) 40 MG tablet TAKE 1 TABLET BY MOUTH  DAILY 90 tablet 1   amLODipine (NORVASC) 2.5 MG tablet Take 1 tablet (2.5 mg total) by mouth 2 (two) times daily. 180 tablet 3   carvedilol (COREG) 6.25 MG tablet Take 1 tablet (6.25 mg total) by mouth 2 (two) times daily with a meal. 180 tablet 1   COVID-19 mRNA vaccine, Pfizer, 30 MCG/0.3ML injection INJECT AS DIRECTED .3 mL 0   cyclobenzaprine (FLEXERIL) 10 MG tablet Take 1 tablet (10 mg total) by mouth 3 (three) times daily as needed for muscle spasms. 30 tablet 1   fluticasone (FLONASE) 50 MCG/ACT nasal spray Place 1 spray into both nostrils in the morning and at bedtime. 1 g 0   glucose blood test strip Check blood sugar twice daily 100 each 11   loratadine (CLARITIN) 10  MG tablet Take 1 tablet (10 mg total) by mouth daily. 90 tablet 3   meloxicam (MOBIC) 15 MG tablet Take 1 tablet daily with food for 14 days. Then take as needed. 30 tablet 1   Multiple Vitamins-Minerals (MULTIVITAMIN ADULT PO) Take 1 tablet by mouth daily. flintstone vitamins     NONFORMULARY OR COMPOUNDED ITEM Thigh high compression socks  20-30 mmhg    Re- low ext edema 1 each 1   pantoprazole (PROTONIX) 40 MG tablet Take 2 tablets (80 mg total) by mouth daily. 180 tablet 3   No current facility-administered medications on file prior to visit.    Past Surgical History:  Procedure Laterality Date   ABDOMINAL HYSTERECTOMY     APPENDECTOMY     2'14   COLONOSCOPY  04/11/11   5 mm polyp removed but not recovered   COLONOSCOPY WITH PROPOFOL N/A 05/11/2017   Procedure: COLONOSCOPY WITH PROPOFOL;  Surgeon: Gatha Mayer, MD;  Location: [facility];  Service: Endoscopy;  Laterality: N/A;   GASTRIC ROUX-EN-Y N/A 02/27/2015   Procedure: LAPAROSCOPIC ROUX-EN-Y GASTRIC BYPASS WITH UPPER ENDOSCOPY;  Surgeon: Johnathan Hausen, MD;  Location: [facility];  Service: General;  Laterality: N/A;   KNEE SURGERY Right     KNEE SURGERY    LAPAROSCOPIC APPENDECTOMY  06/10/2012   Procedure: APPENDECTOMY LAPAROSCOPIC;  Surgeon: Adin Hector, MD;  Location: [facility];  Service: General;  Laterality: N/A;   UPPER GASTROINTESTINAL ENDOSCOPY  04/14/2006   Normal    Allergies  Allergen Reactions   Ace Inhibitors Anaphylaxis    Bowel angioedema   Cheese Nausea And Vomiting   Red Dye Nausea And Vomiting   [name] Flash [Fish Allergy] Nausea And Vomiting    Social History   Socioeconomic History   Marital status: Married    Spouse name: Publishing rights manager   Number of children: 0   Years of education: Not on file   Highest education level: Not on file  Occupational History   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[employer]  Tobacco Use   Smoking status: Never   Smokeless tobacco: Never  Vaping Use   Vaping Use:  Never used  Substance and Sexual Activity   Alcohol use: No    Alcohol/week: 0.0 standard drinks   Drug use: No   Sexual activity: Not on file  Other Topics Concern   Not on file  Social History Narrative   Exercise- no   Social Determinants of Health   Financial Resource Strain: Not on file  Food Insecurity: Not on file  Transportation Needs: Not on file  Physical Activity: Not on file  Stress: Not on file  Social Connections: Not on file  Intimate Partner Violence: Not on file    Family History  Problem Relation Age of Onset   Hyperlipidemia Mother    Hypertension Mother    Cancer Mother 41       hodgkins lymphoma   Diabetes Father    Heart failure Father    Heart disease Father    Hyperlipidemia Brother    Diabetes Paternal Grandmother    Breast cancer Paternal Aunt        unsure of age   Breast cancer Paternal Aunt    Colon cancer Neg Hx    Heart attack Neg Hx    Sudden death Neg Hx    Colon polyps Neg Hx    Esophageal cancer Neg Hx    Stomach cancer Neg Hx    Rectal cancer Neg Hx     Ht 5\' 6"  (1.676 m)   Wt (!) 320 lb (145.2 kg)   BMI 51.65 kg/m   No flowsheet data found.  No flowsheet data found.  Review of Systems: See HPI above.     Objective:  Physical Exam:  Gen: NAD, comfortable in exam room  Knee exam not repeated today.   Assessment & Plan:  1. Bilateral knee arthritis - repeating visco series, first injection given to right knee only today per request.    After informed written consent timeout was performed, patient was lying supine on exam table. Right knee was prepped with alcohol swab and utilizing superolateral approach with ultrasound guidance, patient's right knee was injected intraarticularly with 30mL lidocaine followed by gelsyn. Patient tolerated the procedure well without immediate complications.

## 2020-11-28 ENCOUNTER — Other Ambulatory Visit: Payer: Self-pay

## 2020-11-28 ENCOUNTER — Encounter: Payer: Self-pay | Admitting: Family Medicine

## 2020-11-28 ENCOUNTER — Ambulatory Visit (INDEPENDENT_AMBULATORY_CARE_PROVIDER_SITE_OTHER): Payer: 59 | Admitting: Family Medicine

## 2020-11-28 DIAGNOSIS — M170 Bilateral primary osteoarthritis of knee: Secondary | ICD-10-CM

## 2020-11-28 NOTE — Progress Notes (Signed)
PCP: [name], DO  Subjective:   HPI: Patient is a 60 y.o. female here for viscosupplementation.  7/20: Patient returns to start visco series for her knees - opted to do only right knee today. Has done well with these in the past. No new complaints.  7/27: Patient returns for second visco injection to right knee, first left knee. No new complaints.  Past Medical History:  Diagnosis Date   Acute on chronic appendicitis s/p lap appy 06/10/2012 06/10/2012   surgery done 06-10-12   Arthritis    ankle   Back pain    Chest pain    Complication of anesthesia    Constipation    Diabetes mellitus    TYPE 2- no meds in several months-was taken off meds -monitoring blood sugar.   GERD (gastroesophageal reflux disease)    History of colon polyps    Hyperlipidemia    Hypertension    Joint pain    Lactose intolerance    Morbid obesity - BMI > 50 12/13/2012   Pneumonia    none recent   PONV (postoperative nausea and vomiting)    Poor venous access    "usually difficult stick"   Prediabetes    Sinusitis    SOB (shortness of breath)    Swelling    Vitamin D deficiency     Current Outpatient Medications on File Prior to Visit  Medication Sig Dispense Refill   carvedilol (COREG) 6.25 MG tablet TAKE 1 TABLET BY MOUTH  TWICE DAILY WITH MEALS 180 tablet 1   furosemide (LASIX) 40 MG tablet TAKE 1 TABLET BY MOUTH  DAILY 90 tablet 1   amLODipine (NORVASC) 2.5 MG tablet Take 1 tablet (2.5 mg total) by mouth 2 (two) times daily. 180 tablet 3   carvedilol (COREG) 6.25 MG tablet Take 1 tablet (6.25 mg total) by mouth 2 (two) times daily with a meal. 180 tablet 1   COVID-19 mRNA vaccine, Pfizer, 30 MCG/0.3ML injection INJECT AS DIRECTED .3 mL 0   cyclobenzaprine (FLEXERIL) 10 MG tablet Take 1 tablet (10 mg total) by mouth 3 (three) times daily as needed for muscle spasms. 30 tablet 1   fluticasone (FLONASE) 50 MCG/ACT nasal spray Place 1 spray into both nostrils in the morning and at  bedtime. 1 g 0   glucose blood test strip Check blood sugar twice daily 100 each 11   loratadine (CLARITIN) 10 MG tablet Take 1 tablet (10 mg total) by mouth daily. 90 tablet 3   meloxicam (MOBIC) 15 MG tablet Take 1 tablet daily with food for 14 days. Then take as needed. 30 tablet 1   Multiple Vitamins-Minerals (MULTIVITAMIN ADULT PO) Take 1 tablet by mouth daily. flintstone vitamins     NONFORMULARY OR COMPOUNDED ITEM Thigh high compression socks  20-30 mmhg    Re- low ext edema 1 each 1   pantoprazole (PROTONIX) 40 MG tablet Take 2 tablets (80 mg total) by mouth daily. 180 tablet 3   No current facility-administered medications on file prior to visit.    Past Surgical History:  Procedure Laterality Date   ABDOMINAL HYSTERECTOMY     APPENDECTOMY     2'14   COLONOSCOPY  04/11/11   5 mm polyp removed but not recovered   COLONOSCOPY WITH PROPOFOL N/A 05/11/2017   Procedure: COLONOSCOPY WITH PROPOFOL;  Surgeon: Gatha Mayer, MD;  Location: [facility];  Service: Endoscopy;  Laterality: N/A;   GASTRIC ROUX-EN-Y N/A 02/27/2015   Procedure: LAPAROSCOPIC ROUX-EN-Y  GASTRIC BYPASS WITH UPPER ENDOSCOPY;  Surgeon: Johnathan Hausen, MD;  Location: [facility];  Service: General;  Laterality: N/A;   KNEE SURGERY Right     KNEE SURGERY    LAPAROSCOPIC APPENDECTOMY  06/10/2012   Procedure: APPENDECTOMY LAPAROSCOPIC;  Surgeon: Adin Hector, MD;  Location: [facility];  Service: General;  Laterality: N/A;   UPPER GASTROINTESTINAL ENDOSCOPY  04/14/2006   Normal    Allergies  Allergen Reactions   Ace Inhibitors Anaphylaxis    Bowel angioedema   Cheese Nausea And Vomiting   Red Dye Nausea And Vomiting   [name] Flash [Fish Allergy] Nausea And Vomiting    Social History   Socioeconomic History   Marital status: Married    Spouse name: Publishing rights manager   Number of children: 0   Years of education: Not on file   Highest education level: Not on file  Occupational History   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[employer]  Tobacco Use   Smoking status: Never   Smokeless tobacco: Never  Vaping Use   Vaping Use: Never used  Substance and Sexual Activity   Alcohol use: No    Alcohol/week: 0.0 standard drinks   Drug use: No   Sexual activity: Not on file  Other Topics Concern   Not on file  Social History Narrative   Exercise- no   Social Determinants of Health   Financial Resource Strain: Not on file  Food Insecurity: Not on file  Transportation Needs: Not on file  Physical Activity: Not on file  Stress: Not on file  Social Connections: Not on file  Intimate Partner Violence: Not on file    Family History  Problem Relation Age of Onset   Hyperlipidemia Mother    Hypertension Mother    Cancer Mother 60       hodgkins lymphoma   Diabetes Father    Heart failure Father    Heart disease Father    Hyperlipidemia Brother    Diabetes Paternal Grandmother    Breast cancer Paternal Aunt        unsure of age   Breast cancer Paternal Aunt    Colon cancer Neg Hx    Heart attack Neg Hx    Sudden death Neg Hx    Colon polyps Neg Hx    Esophageal cancer Neg Hx    Stomach cancer Neg Hx    Rectal cancer Neg Hx     BP (!) 129/55   Ht '5\' 6"'$  (1.676 m)   Wt (!) 320 lb (145.2 kg)   BMI 51.65 kg/m   No flowsheet data found.  No flowsheet data found.  Review of Systems: See HPI above.     Objective:  Physical Exam:  Gen: NAD, comfortable in exam room  Knee exam not repeated today.   Assessment & Plan:  1. Bilateral knee arthritis - second right knee gelsyn injection, first left injection given today.  F/u in 1 week.  After informed written consent timeout was performed, patient was lying supine on exam table. Right knee was prepped with alcohol swab and utilizing superolateral approach, patient's right knee was injected intraarticularly with 10m lidocaine followed by gelsyn. Patient tolerated the procedure well without immediate complications.  After informed written  consent timeout was performed, patient was lying supine on exam table. Left knee was prepped with alcohol swab and utilizing superolateral approach, patient's left knee was injected intraarticularly with 367mlidocaine followed by gelsyn. Patient tolerated the procedure well without  immediate complications.

## 2020-12-10 ENCOUNTER — Ambulatory Visit: Payer: 59 | Admitting: Family Medicine

## 2020-12-12 ENCOUNTER — Ambulatory Visit (INDEPENDENT_AMBULATORY_CARE_PROVIDER_SITE_OTHER): Payer: 59 | Admitting: Family Medicine

## 2020-12-12 ENCOUNTER — Encounter: Payer: Self-pay | Admitting: Family Medicine

## 2020-12-12 ENCOUNTER — Other Ambulatory Visit: Payer: Self-pay

## 2020-12-12 DIAGNOSIS — M1712 Unilateral primary osteoarthritis, left knee: Secondary | ICD-10-CM

## 2020-12-12 DIAGNOSIS — M1711 Unilateral primary osteoarthritis, right knee: Secondary | ICD-10-CM | POA: Insufficient documentation

## 2020-12-12 NOTE — Progress Notes (Signed)
PCP: [name], DO  Subjective:   HPI: Patient is a 60 y.o. female here for viscosupplementation.  7/20: Patient returns to start visco series for her knees - opted to do only right knee today. Has done well with these in the past. No new complaints.  7/27: Patient returns for second visco injection to right knee, first left knee. No new complaints.  8/10: Patient returns for third visco injection right knee, second left knee. She had some increased knee pain and swelling after last visit that resolved.  Past Medical History:  Diagnosis Date   Acute on chronic appendicitis s/p lap appy 06/10/2012 06/10/2012   surgery done 06-10-12   Arthritis    ankle   Back pain    Chest pain    Complication of anesthesia    Constipation    Diabetes mellitus    TYPE 2- no meds in several months-was taken off meds -monitoring blood sugar.   GERD (gastroesophageal reflux disease)    History of colon polyps    Hyperlipidemia    Hypertension    Joint pain    Lactose intolerance    Morbid obesity - BMI > 50 12/13/2012   Pneumonia    none recent   PONV (postoperative nausea and vomiting)    Poor venous access    "usually difficult stick"   Prediabetes    Sinusitis    SOB (shortness of breath)    Swelling    Vitamin D deficiency     Current Outpatient Medications on File Prior to Visit  Medication Sig Dispense Refill   carvedilol (COREG) 6.25 MG tablet TAKE 1 TABLET BY MOUTH  TWICE DAILY WITH MEALS 180 tablet 1   furosemide (LASIX) 40 MG tablet TAKE 1 TABLET BY MOUTH  DAILY 90 tablet 1   amLODipine (NORVASC) 2.5 MG tablet Take 1 tablet (2.5 mg total) by mouth 2 (two) times daily. 180 tablet 3   carvedilol (COREG) 6.25 MG tablet Take 1 tablet (6.25 mg total) by mouth 2 (two) times daily with a meal. 180 tablet 1   COVID-19 mRNA vaccine, Pfizer, 30 MCG/0.3ML injection INJECT AS DIRECTED .3 mL 0   cyclobenzaprine (FLEXERIL) 10 MG tablet Take 1 tablet (10 mg total) by mouth 3  (three) times daily as needed for muscle spasms. 30 tablet 1   fluticasone (FLONASE) 50 MCG/ACT nasal spray Place 1 spray into both nostrils in the morning and at bedtime. 1 g 0   glucose blood test strip Check blood sugar twice daily 100 each 11   loratadine (CLARITIN) 10 MG tablet Take 1 tablet (10 mg total) by mouth daily. 90 tablet 3   meloxicam (MOBIC) 15 MG tablet Take 1 tablet daily with food for 14 days. Then take as needed. 30 tablet 1   Multiple Vitamins-Minerals (MULTIVITAMIN ADULT PO) Take 1 tablet by mouth daily. flintstone vitamins     NONFORMULARY OR COMPOUNDED ITEM Thigh high compression socks  20-30 mmhg    Re- low ext edema 1 each 1   pantoprazole (PROTONIX) 40 MG tablet Take 2 tablets (80 mg total) by mouth daily. 180 tablet 3   No current facility-administered medications on file prior to visit.    Past Surgical History:  Procedure Laterality Date   ABDOMINAL HYSTERECTOMY     APPENDECTOMY     2'14   COLONOSCOPY  04/11/11   5 mm polyp removed but not recovered   COLONOSCOPY WITH PROPOFOL N/A 05/11/2017   Procedure: COLONOSCOPY WITH PROPOFOL;  Surgeon: Gatha Mayer, MD;  Location: [facility];  Service: Endoscopy;  Laterality: N/A;   GASTRIC ROUX-EN-Y N/A 02/27/2015   Procedure: LAPAROSCOPIC ROUX-EN-Y GASTRIC BYPASS WITH UPPER ENDOSCOPY;  Surgeon: Johnathan Hausen, MD;  Location: [facility];  Service: General;  Laterality: N/A;   KNEE SURGERY Right     KNEE SURGERY    LAPAROSCOPIC APPENDECTOMY  06/10/2012   Procedure: APPENDECTOMY LAPAROSCOPIC;  Surgeon: Adin Hector, MD;  Location: [facility];  Service: General;  Laterality: N/A;   UPPER GASTROINTESTINAL ENDOSCOPY  04/14/2006   Normal    Allergies  Allergen Reactions   Ace Inhibitors Anaphylaxis    Bowel angioedema   Cheese Nausea And Vomiting   Red Dye Nausea And Vomiting   [name] Flash [Fish Allergy] Nausea And Vomiting    Social History   Socioeconomic History   Marital status: Married    Spouse name: Publishing rights manager    Number of children: 0   Years of education: Not on file   Highest education level: Not on file  Occupational History   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[employer]  Tobacco Use   Smoking status: Never   Smokeless tobacco: Never  Vaping Use   Vaping Use: Never used  Substance and Sexual Activity   Alcohol use: No    Alcohol/week: 0.0 standard drinks   Drug use: No   Sexual activity: Not on file  Other Topics Concern   Not on file  Social History Narrative   Exercise- no   Social Determinants of Health   Financial Resource Strain: Not on file  Food Insecurity: Not on file  Transportation Needs: Not on file  Physical Activity: Not on file  Stress: Not on file  Social Connections: Not on file  Intimate Partner Violence: Not on file    Family History  Problem Relation Age of Onset   Hyperlipidemia Mother    Hypertension Mother    Cancer Mother 28       hodgkins lymphoma   Diabetes Father    Heart failure Father    Heart disease Father    Hyperlipidemia Brother    Diabetes Paternal Grandmother    Breast cancer Paternal Aunt        unsure of age   Breast cancer Paternal Aunt    Colon cancer Neg Hx    Heart attack Neg Hx    Sudden death Neg Hx    Colon polyps Neg Hx    Esophageal cancer Neg Hx    Stomach cancer Neg Hx    Rectal cancer Neg Hx     Ht '5\' 6"'$  (1.676 m)   Wt (!) 320 lb (145.2 kg)   BMI 51.65 kg/m   No flowsheet data found.  No flowsheet data found.  Review of Systems: See HPI above.     Objective:  Physical Exam:  Gen: NAD, comfortable in exam room  Knee exam not repeated today.   Assessment & Plan:  1. Bilateral knee arthritis - third right knee gelsyn injection, second left knee injection given today.  F/u in 1 week for third left knee gelsyn injection.  After informed written consent timeout was performed, patient was lying supine on exam table. Right knee was prepped with alcohol swab and utilizing superolateral  approach, patient's right knee was injected intraarticularly with 64m lidocaine followed by gelsyn. Patient tolerated the procedure well without immediate complications.  After informed written consent timeout was performed, patient was lying supine on exam table. Left  knee was prepped with alcohol swab and utilizing superolateral approach, patient's left knee was injected intraarticularly with 54m lidocaine followed by gelsyn. Patient tolerated the procedure well without immediate complications.

## 2020-12-19 ENCOUNTER — Other Ambulatory Visit: Payer: Self-pay

## 2020-12-19 ENCOUNTER — Ambulatory Visit (INDEPENDENT_AMBULATORY_CARE_PROVIDER_SITE_OTHER): Payer: 59 | Admitting: Family Medicine

## 2020-12-19 ENCOUNTER — Encounter: Payer: Self-pay | Admitting: Family Medicine

## 2020-12-19 DIAGNOSIS — M1712 Unilateral primary osteoarthritis, left knee: Secondary | ICD-10-CM

## 2020-12-19 NOTE — Progress Notes (Signed)
PCP: [name], DO  Subjective:   HPI: Patient is a 61 y.o. female here for viscosupplementation.  7/20: Patient returns to start visco series for her knees - opted to do only right knee today. Has done well with these in the past. No new complaints.  7/27: Patient returns for second visco injection to right knee, first left knee. No new complaints.  8/10: Patient returns for third visco injection right knee, second left knee. She had some increased knee pain and swelling after last visit that resolved.  8/17: Patient returns for third visco injection left knee. Not noticed improvement yet in right knee.  Past Medical History:  Diagnosis Date   Acute on chronic appendicitis s/p lap appy 06/10/2012 06/10/2012   surgery done 06-10-12   Arthritis    ankle   Back pain    Chest pain    Complication of anesthesia    Constipation    Diabetes mellitus    TYPE 2- no meds in several months-was taken off meds -monitoring blood sugar.   GERD (gastroesophageal reflux disease)    History of colon polyps    Hyperlipidemia    Hypertension    Joint pain    Lactose intolerance    Morbid obesity - BMI > 50 12/13/2012   Pneumonia    none recent   PONV (postoperative nausea and vomiting)    Poor venous access    "usually difficult stick"   Prediabetes    Sinusitis    SOB (shortness of breath)    Swelling    Vitamin D deficiency     Current Outpatient Medications on File Prior to Visit  Medication Sig Dispense Refill   carvedilol (COREG) 6.25 MG tablet TAKE 1 TABLET BY MOUTH  TWICE DAILY WITH MEALS 180 tablet 1   furosemide (LASIX) 40 MG tablet TAKE 1 TABLET BY MOUTH  DAILY 90 tablet 1   amLODipine (NORVASC) 2.5 MG tablet Take 1 tablet (2.5 mg total) by mouth 2 (two) times daily. 180 tablet 3   carvedilol (COREG) 6.25 MG tablet Take 1 tablet (6.25 mg total) by mouth 2 (two) times daily with a meal. 180 tablet 1   COVID-19 mRNA vaccine, Pfizer, 30 MCG/0.3ML injection INJECT  AS DIRECTED .3 mL 0   cyclobenzaprine (FLEXERIL) 10 MG tablet Take 1 tablet (10 mg total) by mouth 3 (three) times daily as needed for muscle spasms. 30 tablet 1   fluticasone (FLONASE) 50 MCG/ACT nasal spray Place 1 spray into both nostrils in the morning and at bedtime. 1 g 0   glucose blood test strip Check blood sugar twice daily 100 each 11   loratadine (CLARITIN) 10 MG tablet Take 1 tablet (10 mg total) by mouth daily. 90 tablet 3   meloxicam (MOBIC) 15 MG tablet Take 1 tablet daily with food for 14 days. Then take as needed. 30 tablet 1   Multiple Vitamins-Minerals (MULTIVITAMIN ADULT PO) Take 1 tablet by mouth daily. flintstone vitamins     NONFORMULARY OR COMPOUNDED ITEM Thigh high compression socks  20-30 mmhg    Re- low ext edema 1 each 1   pantoprazole (PROTONIX) 40 MG tablet Take 2 tablets (80 mg total) by mouth daily. 180 tablet 3   No current facility-administered medications on file prior to visit.    Past Surgical History:  Procedure Laterality Date   ABDOMINAL HYSTERECTOMY     APPENDECTOMY     2'14   COLONOSCOPY  04/11/11   5 mm polyp removed  but not recovered   COLONOSCOPY WITH PROPOFOL N/A 05/11/2017   Procedure: COLONOSCOPY WITH PROPOFOL;  Surgeon: Gatha Mayer, MD;  Location: [facility];  Service: Endoscopy;  Laterality: N/A;   GASTRIC ROUX-EN-Y N/A 02/27/2015   Procedure: LAPAROSCOPIC ROUX-EN-Y GASTRIC BYPASS WITH UPPER ENDOSCOPY;  Surgeon: Johnathan Hausen, MD;  Location: [facility];  Service: General;  Laterality: N/A;   KNEE SURGERY Right     KNEE SURGERY    LAPAROSCOPIC APPENDECTOMY  06/10/2012   Procedure: APPENDECTOMY LAPAROSCOPIC;  Surgeon: Adin Hector, MD;  Location: [facility];  Service: General;  Laterality: N/A;   UPPER GASTROINTESTINAL ENDOSCOPY  04/14/2006   Normal    Allergies  Allergen Reactions   Ace Inhibitors Anaphylaxis    Bowel angioedema   Cheese Nausea And Vomiting   Red Dye Nausea And Vomiting   [name] Flash [Fish Allergy] Nausea And Vomiting     Social History   Socioeconomic History   Marital status: Married    Spouse name: Publishing rights manager   Number of children: 0   Years of education: Not on file   Highest education level: Not on file  Occupational History   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[employer]  Tobacco Use   Smoking status: Never   Smokeless tobacco: Never  Vaping Use   Vaping Use: Never used  Substance and Sexual Activity   Alcohol use: No    Alcohol/week: 0.0 standard drinks   Drug use: No   Sexual activity: Not on file  Other Topics Concern   Not on file  Social History Narrative   Exercise- no   Social Determinants of Health   Financial Resource Strain: Not on file  Food Insecurity: Not on file  Transportation Needs: Not on file  Physical Activity: Not on file  Stress: Not on file  Social Connections: Not on file  Intimate Partner Violence: Not on file    Family History  Problem Relation Age of Onset   Hyperlipidemia Mother    Hypertension Mother    Cancer Mother 31       hodgkins lymphoma   Diabetes Father    Heart failure Father    Heart disease Father    Hyperlipidemia Brother    Diabetes Paternal Grandmother    Breast cancer Paternal Aunt        unsure of age   Breast cancer Paternal Aunt    Colon cancer Neg Hx    Heart attack Neg Hx    Sudden death Neg Hx    Colon polyps Neg Hx    Esophageal cancer Neg Hx    Stomach cancer Neg Hx    Rectal cancer Neg Hx     Ht '5\' 6"'$  (1.676 m)   Wt (!) 320 lb (145.2 kg)   BMI 51.65 kg/m   No flowsheet data found.  No flowsheet data found.  Review of Systems: See HPI above.     Objective:  Physical Exam:  Gen: NAD, comfortable in exam room  Knee exam not repeated today.   Assessment & Plan:  1. Bilateral knee arthritis - s/p 3 right knee gelsyn injections.  Third left knee gelsyn injection given today.  Consider fourth injections.  She had some nausea doing water aerobics so is going to discuss medication with her  PCP to allow her to do this in hopes to lose some weight along with dietary changes.    After informed written consent timeout was performed, patient was lying supine on exam  table. Left knee was prepped with alcohol swab and utilizing superolateral approach with ultrasound guidance, patient's left knee was injected intraarticularly with 72m lidocaine followed by gelsyn. Patient tolerated the procedure well without immediate complications.

## 2020-12-20 ENCOUNTER — Other Ambulatory Visit: Payer: Self-pay | Admitting: Family Medicine

## 2020-12-20 DIAGNOSIS — K219 Gastro-esophageal reflux disease without esophagitis: Secondary | ICD-10-CM

## 2020-12-31 ENCOUNTER — Other Ambulatory Visit: Payer: Self-pay

## 2020-12-31 ENCOUNTER — Ambulatory Visit (INDEPENDENT_AMBULATORY_CARE_PROVIDER_SITE_OTHER): Payer: 59 | Admitting: Family Medicine

## 2020-12-31 DIAGNOSIS — M170 Bilateral primary osteoarthritis of knee: Secondary | ICD-10-CM | POA: Diagnosis not present

## 2021-01-01 NOTE — Progress Notes (Signed)
PCP: [name], DO  Subjective:   HPI: Patient is a 60 y.o. female here for viscosupplementation.  7/20: Patient returns to start visco series for her knees - opted to do only right knee today. Has done well with these in the past. No new complaints.  7/27: Patient returns for second visco injection to right knee, first left knee. No new complaints.  8/10: Patient returns for third visco injection right knee, second left knee. She had some increased knee pain and swelling after last visit that resolved.  8/17: Patient returns for third visco injection left knee. Not noticed improvement yet in right knee.  8/29: Patient returns for bilateral 4th visco injections.  Past Medical History:  Diagnosis Date   Acute on chronic appendicitis s/p lap appy 06/10/2012 06/10/2012   surgery done 06-10-12   Arthritis    ankle   Back pain    Chest pain    Complication of anesthesia    Constipation    Diabetes mellitus    TYPE 2- no meds in several months-was taken off meds -monitoring blood sugar.   GERD (gastroesophageal reflux disease)    History of colon polyps    Hyperlipidemia    Hypertension    Joint pain    Lactose intolerance    Morbid obesity - BMI > 50 12/13/2012   Pneumonia    none recent   PONV (postoperative nausea and vomiting)    Poor venous access    "usually difficult stick"   Prediabetes    Sinusitis    SOB (shortness of breath)    Swelling    Vitamin D deficiency     Current Outpatient Medications on File Prior to Visit  Medication Sig Dispense Refill   carvedilol (COREG) 6.25 MG tablet TAKE 1 TABLET BY MOUTH  TWICE DAILY WITH MEALS 180 tablet 1   furosemide (LASIX) 40 MG tablet TAKE 1 TABLET BY MOUTH  DAILY 90 tablet 1   amLODipine (NORVASC) 2.5 MG tablet Take 1 tablet (2.5 mg total) by mouth 2 (two) times daily. 180 tablet 3   carvedilol (COREG) 6.25 MG tablet Take 1 tablet (6.25 mg total) by mouth 2 (two) times daily with a meal. 180 tablet 1    COVID-19 mRNA vaccine, Pfizer, 30 MCG/0.3ML injection INJECT AS DIRECTED .3 mL 0   cyclobenzaprine (FLEXERIL) 10 MG tablet Take 1 tablet (10 mg total) by mouth 3 (three) times daily as needed for muscle spasms. 30 tablet 1   fluticasone (FLONASE) 50 MCG/ACT nasal spray Place 1 spray into both nostrils in the morning and at bedtime. 1 g 0   glucose blood test strip Check blood sugar twice daily 100 each 11   loratadine (CLARITIN) 10 MG tablet Take 1 tablet (10 mg total) by mouth daily. 90 tablet 3   meloxicam (MOBIC) 15 MG tablet Take 1 tablet daily with food for 14 days. Then take as needed. 30 tablet 1   Multiple Vitamins-Minerals (MULTIVITAMIN ADULT PO) Take 1 tablet by mouth daily. flintstone vitamins     NONFORMULARY OR COMPOUNDED ITEM Thigh high compression socks  20-30 mmhg    Re- low ext edema 1 each 1   pantoprazole (PROTONIX) 40 MG tablet TAKE 2 TABLETS BY MOUTH  DAILY 180 tablet 3   No current facility-administered medications on file prior to visit.    Past Surgical History:  Procedure Laterality Date   ABDOMINAL HYSTERECTOMY     APPENDECTOMY     2'14   COLONOSCOPY  04/11/11   5 mm polyp removed but not recovered   COLONOSCOPY WITH PROPOFOL N/A 05/11/2017   Procedure: COLONOSCOPY WITH PROPOFOL;  Surgeon: Gatha Mayer, MD;  Location: [facility];  Service: Endoscopy;  Laterality: N/A;   GASTRIC ROUX-EN-Y N/A 02/27/2015   Procedure: LAPAROSCOPIC ROUX-EN-Y GASTRIC BYPASS WITH UPPER ENDOSCOPY;  Surgeon: Johnathan Hausen, MD;  Location: [facility];  Service: General;  Laterality: N/A;   KNEE SURGERY Right     KNEE SURGERY    LAPAROSCOPIC APPENDECTOMY  06/10/2012   Procedure: APPENDECTOMY LAPAROSCOPIC;  Surgeon: Adin Hector, MD;  Location: [facility];  Service: General;  Laterality: N/A;   UPPER GASTROINTESTINAL ENDOSCOPY  04/14/2006   Normal    Allergies  Allergen Reactions   Ace Inhibitors Anaphylaxis    Bowel angioedema   Cheese Nausea And Vomiting   Red Dye Nausea And  Vomiting   [name] Flash [Fish Allergy] Nausea And Vomiting    Social History   Socioeconomic History   Marital status: Married    Spouse name: Publishing rights manager   Number of children: 0   Years of education: Not on file   Highest education level: Not on file  Occupational History   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[employer]  Tobacco Use   Smoking status: Never   Smokeless tobacco: Never  Vaping Use   Vaping Use: Never used  Substance and Sexual Activity   Alcohol use: No    Alcohol/week: 0.0 standard drinks   Drug use: No   Sexual activity: Not on file  Other Topics Concern   Not on file  Social History Narrative   Exercise- no   Social Determinants of Health   Financial Resource Strain: Not on file  Food Insecurity: Not on file  Transportation Needs: Not on file  Physical Activity: Not on file  Stress: Not on file  Social Connections: Not on file  Intimate Partner Violence: Not on file    Family History  Problem Relation Age of Onset   Hyperlipidemia Mother    Hypertension Mother    Cancer Mother 75       hodgkins lymphoma   Diabetes Father    Heart failure Father    Heart disease Father    Hyperlipidemia Brother    Diabetes Paternal Grandmother    Breast cancer Paternal Aunt        unsure of age   Breast cancer Paternal Aunt    Colon cancer Neg Hx    Heart attack Neg Hx    Sudden death Neg Hx    Colon polyps Neg Hx    Esophageal cancer Neg Hx    Stomach cancer Neg Hx    Rectal cancer Neg Hx     Ht '5\' 6"'$  (1.676 m)   Wt (!) 320 lb (145.2 kg)   BMI 51.65 kg/m   No flowsheet data found.  No flowsheet data found.  Review of Systems: See HPI above.     Objective:  Physical Exam:  Gen: NAD, comfortable in exam room  Knee exam not repeated today.   Assessment & Plan:  1. Bilateral knee arthritis - fourth gelsyn injections given today.  Continue with dietary changes, working on [employer].    After informed written consent timeout was  performed, patient was lying supine on exam table. Right knee was prepped with alcohol swab and utilizing superolateral approach with ultrasound guidance, patient's right knee was injected intraarticularly with 25m lidocaine followed by gelsyn. Patient tolerated the  procedure well without immediate complications.  After informed written consent timeout was performed, patient was lying supine on exam table. Left knee was prepped with alcohol swab and utilizing superolateral approach with ultrasound guidance, patient's left knee was injected intraarticularly with 68m lidocaine followed by gelsyn. Patient tolerated the procedure well without immediate complications.

## 2021-02-12 ENCOUNTER — Other Ambulatory Visit: Payer: Self-pay | Admitting: Family Medicine

## 2021-02-12 DIAGNOSIS — I10 Essential (primary) hypertension: Secondary | ICD-10-CM

## 2021-02-14 ENCOUNTER — Other Ambulatory Visit: Payer: Self-pay

## 2021-02-15 ENCOUNTER — Ambulatory Visit (INDEPENDENT_AMBULATORY_CARE_PROVIDER_SITE_OTHER): Payer: 59 | Admitting: Family Medicine

## 2021-02-15 ENCOUNTER — Encounter: Payer: Self-pay | Admitting: Family Medicine

## 2021-02-15 VITALS — BP 128/88 | HR 66 | Temp 98.2°F | Resp 20 | Ht 66.0 in | Wt 349.4 lb

## 2021-02-15 DIAGNOSIS — I890 Lymphedema, not elsewhere classified: Secondary | ICD-10-CM

## 2021-02-15 DIAGNOSIS — Z23 Encounter for immunization: Secondary | ICD-10-CM

## 2021-02-15 DIAGNOSIS — Z0000 Encounter for general adult medical examination without abnormal findings: Secondary | ICD-10-CM

## 2021-02-15 DIAGNOSIS — E1165 Type 2 diabetes mellitus with hyperglycemia: Secondary | ICD-10-CM

## 2021-02-15 DIAGNOSIS — K219 Gastro-esophageal reflux disease without esophagitis: Secondary | ICD-10-CM

## 2021-02-15 DIAGNOSIS — E1169 Type 2 diabetes mellitus with other specified complication: Secondary | ICD-10-CM

## 2021-02-15 DIAGNOSIS — I10 Essential (primary) hypertension: Secondary | ICD-10-CM

## 2021-02-15 DIAGNOSIS — E43 Unspecified severe protein-calorie malnutrition: Secondary | ICD-10-CM | POA: Diagnosis not present

## 2021-02-15 DIAGNOSIS — E785 Hyperlipidemia, unspecified: Secondary | ICD-10-CM

## 2021-02-15 DIAGNOSIS — E1151 Type 2 diabetes mellitus with diabetic peripheral angiopathy without gangrene: Secondary | ICD-10-CM | POA: Diagnosis not present

## 2021-02-15 DIAGNOSIS — J302 Other seasonal allergic rhinitis: Secondary | ICD-10-CM

## 2021-02-15 LAB — COMPREHENSIVE METABOLIC PANEL
ALT: 21 U/L (ref 0–35)
AST: 25 U/L (ref 0–37)
Albumin: 3.9 g/dL (ref 3.5–5.2)
Alkaline Phosphatase: 86 U/L (ref 39–117)
BUN: 10 mg/dL (ref 6–23)
CO2: 30 mEq/L (ref 19–32)
Calcium: 8.7 mg/dL (ref 8.4–10.5)
Chloride: 102 mEq/L (ref 96–112)
Creatinine, Ser: 0.93 mg/dL (ref 0.40–1.20)
GFR: 67.03 mL/min (ref >60.00)
Glucose, Bld: 95 mg/dL (ref 70–99)
Potassium: 3.7 mEq/L (ref 3.5–5.1)
Sodium: 139 mEq/L (ref 135–145)
Total Bilirubin: 0.7 mg/dL (ref 0.2–1.2)
Total Protein: 6.9 g/dL (ref 6.0–8.3)

## 2021-02-15 LAB — HEMOGLOBIN A1C: Hgb A1c MFr Bld: 6.1 % (ref 4.6–6.5)

## 2021-02-15 LAB — LIPID PANEL
Cholesterol: 187 mg/dL (ref 0–200)
HDL: 71.9 mg/dL (ref >39.00)
LDL Cholesterol: 104 mg/dL — ABNORMAL HIGH (ref 0–99)
NonHDL: 115.15
Total CHOL/HDL Ratio: 3
Triglycerides: 56 mg/dL (ref 0.0–149.0)
VLDL: 11.2 mg/dL (ref 0.0–40.0)

## 2021-02-15 LAB — CBC WITH DIFFERENTIAL/PLATELET
Basophils Absolute: 0 10*3/uL (ref 0.0–0.1)
Basophils Relative: 0.9 % (ref 0.0–3.0)
Eosinophils Absolute: 0.1 10*3/uL (ref 0.0–0.7)
Eosinophils Relative: 1.7 % (ref 0.0–5.0)
HCT: 36.2 % (ref 36.0–46.0)
Hemoglobin: 11.7 g/dL — ABNORMAL LOW (ref 12.0–15.0)
Lymphocytes Relative: 34 % (ref 12.0–46.0)
Lymphs Abs: 1 10*3/uL (ref 0.7–4.0)
MCHC: 32.3 g/dL (ref 30.0–36.0)
MCV: 87.9 fl (ref 78.0–100.0)
Monocytes Absolute: 0.2 10*3/uL (ref 0.1–1.0)
Monocytes Relative: 7.7 % (ref 3.0–12.0)
Neutro Abs: 1.6 10*3/uL (ref 1.4–7.7)
Neutrophils Relative %: 55.7 % (ref 43.0–77.0)
Platelets: 230 10*3/uL (ref 150.0–400.0)
RBC: 4.12 Mil/uL (ref 3.87–5.11)
RDW: 14.4 % (ref 11.5–15.5)
WBC: 2.9 10*3/uL — ABNORMAL LOW (ref 4.0–10.5)

## 2021-02-15 LAB — MICROALBUMIN / CREATININE URINE RATIO
Creatinine,U: 71.2 mg/dL
Microalb Creat Ratio: 1 mg/g (ref 0.0–30.0)
Microalb, Ur: <0.7 mg/dL (ref 0.0–1.9)

## 2021-02-15 LAB — TSH: TSH: 2.26 u[IU]/mL (ref 0.35–5.50)

## 2021-02-15 MED ORDER — PANTOPRAZOLE SODIUM 40 MG PO TBEC: 80.0000 mg | DELAYED_RELEASE_TABLET | Freq: Every day | ORAL | 3 refills | Status: DC

## 2021-02-15 MED ORDER — LORATADINE 10 MG PO TABS: 10.0000 mg | ORAL_TABLET | Freq: Every day | ORAL | 11 refills | Status: DC

## 2021-02-15 MED ORDER — FLUTICASONE PROPIONATE 50 MCG/ACT NA SUSP: 1.0000 | Freq: Two times a day (BID) | NASAL | 6 refills | Status: DC

## 2021-02-15 NOTE — Assessment & Plan Note (Signed)
Compression stockings.  

## 2021-02-15 NOTE — Assessment & Plan Note (Signed)
hgba1c to be checked, minimize simple carbs. Increase exercise as tolerated. Continue current meds  

## 2021-02-15 NOTE — Assessment & Plan Note (Signed)
Well controlled, no changes to meds. Encouraged heart healthy diet such as the DASH diet and exercise as tolerated.  °

## 2021-02-15 NOTE — Progress Notes (Signed)
Subjective:   By signing my name below, I, Tammy [name], attest that this documentation has been prepared under the direction and in the presence of [name], DO. 02/15/2021   Patient ID: Tammy [name], female    [name]: Dec 20, 1960, 60 y.o.   MRN: [id number]  Chief Complaint  Patient presents with   Annual Exam    Pt states fasting     HPI Patient is in today for a comprehensive physical exam.  She reports she has been having a headache and thinks it is because she stopped using Flonase and Claritin to manage her allergies. She would like to put in a new prescription for Flonase since insurance did not want to pay for it before.  She reports feeling sore in her left shoulder. She has been using Tylenol to manage the pain. She also has pain in her knees bilaterally and is taking steroid injections for it. She also mentions that her right knee "pops" when she is walking and she has mentioned it to her sports medicine doctor but she declined his suggestion of surgery.  Her blood pressure is stable at this visit. BP Readings from Last 3 Encounters:  02/15/21 128/88  11/28/20 (!) 129/55  11/14/20 125/60   She is also interested in getting pantyhose compression stockings to manage the lymphoedema in her legs.   She is requesting for a refill on 40 mg Protonix PO daily.  She does not check her blood sugar levels at home but knows when it is low because she starts feeling weak and has headaches.  She denies fever, hearing loss, ear pain,congestion, sinus pain, sore throat, eye pain, chest pain, palpitations, cough, shortness of breath, wheezing, nausea. vomiting, diarrhea, constipation, blood in stool, dysuria,frequency, hematuria.   She is willing to get the flu and pneumonia vaccine today. She has 3 Pfizer Covid-19 vaccines at this time.  She is UTD on vision care.  There has been no recent changes in her family history.  Past Medical History:  Diagnosis Date   Acute  on chronic appendicitis s/p lap appy 06/10/2012 06/10/2012   surgery done 06-10-12   Arthritis    ankle   Back pain    Chest pain    Complication of anesthesia    Constipation    Diabetes mellitus    TYPE 2- no meds in several months-was taken off meds -monitoring blood sugar.   GERD (gastroesophageal reflux disease)    History of colon polyps    Hyperlipidemia    Hypertension    Joint pain    Lactose intolerance    Morbid obesity - BMI > 50 12/13/2012   Pneumonia    none recent   PONV (postoperative nausea and vomiting)    Poor venous access    "usually difficult stick"   Prediabetes    Sinusitis    SOB (shortness of breath)    Swelling    Vitamin D deficiency     Past Surgical History:  Procedure Laterality Date   ABDOMINAL HYSTERECTOMY     APPENDECTOMY     2'14   COLONOSCOPY  04/11/11   5 mm polyp removed but not recovered   COLONOSCOPY WITH PROPOFOL N/A 05/11/2017   Procedure: COLONOSCOPY WITH PROPOFOL;  Surgeon: Gatha Mayer, MD;  Location: [facility];  Service: Endoscopy;  Laterality: N/A;   GASTRIC ROUX-EN-Y N/A 02/27/2015   Procedure: LAPAROSCOPIC ROUX-EN-Y GASTRIC BYPASS WITH UPPER ENDOSCOPY;  Surgeon: Johnathan Hausen, MD;  Location: [facility];  Service: General;  Laterality: N/A;   KNEE SURGERY Right     KNEE SURGERY    LAPAROSCOPIC APPENDECTOMY  06/10/2012   Procedure: APPENDECTOMY LAPAROSCOPIC;  Surgeon: Adin Hector, MD;  Location: [facility];  Service: General;  Laterality: N/A;   UPPER GASTROINTESTINAL ENDOSCOPY  04/14/2006   Normal    Family History  Problem Relation Age of Onset   Hyperlipidemia Mother    Hypertension Mother    Cancer Mother 47       hodgkins lymphoma   Diabetes Father    Heart failure Father    Heart disease Father    Hyperlipidemia Brother    Diabetes Paternal Grandmother    Breast cancer Paternal Aunt        unsure of age   Breast cancer Paternal Aunt    Heart disease Cousin    Colon cancer Neg Hx    Heart attack Neg Hx     Sudden death Neg Hx    Colon polyps Neg Hx    Esophageal cancer Neg Hx    Stomach cancer Neg Hx    Rectal cancer Neg Hx     Social History   Socioeconomic History   Marital status: Married    Spouse name: Publishing rights manager   Number of children: 0   Years of education: Not on file   Highest education level: Not on file  Occupational History   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[employer]  Tobacco Use   Smoking status: Never   Smokeless tobacco: Never  Vaping Use   Vaping Use: Never used  Substance and Sexual Activity   Alcohol use: No    Alcohol/week: 0.0 standard drinks   Drug use: No   Sexual activity: Not on file  Other Topics Concern   Not on file  Social History Narrative   Exercise- no   Social Determinants of Health   Financial Resource Strain: Not on file  Food Insecurity: Not on file  Transportation Needs: Not on file  Physical Activity: Not on file  Stress: Not on file  Social Connections: Not on file  Intimate Partner Violence: Not on file    Outpatient Medications Prior to Visit  Medication Sig Dispense Refill   amLODipine (NORVASC) 2.5 MG tablet Take 1 tablet (2.5 mg total) by mouth 2 (two) times daily. 180 tablet 3   carvedilol (COREG) 6.25 MG tablet TAKE 1 TABLET BY MOUTH  TWICE DAILY WITH MEALS 180 tablet 3   furosemide (LASIX) 40 MG tablet TAKE 1 TABLET BY MOUTH  DAILY 90 tablet 1   glucose blood test strip Check blood sugar twice daily 100 each 11   meloxicam (MOBIC) 15 MG tablet Take 1 tablet daily with food for 14 days. Then take as needed. 30 tablet 1   Multiple Vitamins-Minerals (MULTIVITAMIN ADULT PO) Take 1 tablet by mouth daily. flintstone vitamins     NONFORMULARY OR COMPOUNDED ITEM Thigh high compression socks  20-30 mmhg    Re- low ext edema 1 each 1   fluticasone (FLONASE) 50 MCG/ACT nasal spray Place 1 spray into both nostrils in the morning and at bedtime. 1 g 0   pantoprazole (PROTONIX) 40 MG tablet TAKE 2 TABLETS BY MOUTH   DAILY 180 tablet 3   COVID-19 mRNA vaccine, Pfizer, 30 MCG/0.3ML injection INJECT AS DIRECTED (Patient not taking: Reported on 02/15/2021) .3 mL 0   cyclobenzaprine (FLEXERIL) 10 MG tablet Take 1 tablet (10 mg total) by mouth 3 (three) times  daily as needed for muscle spasms. (Patient not taking: Reported on 02/15/2021) 30 tablet 1   loratadine (CLARITIN) 10 MG tablet Take 1 tablet (10 mg total) by mouth daily. (Patient not taking: Reported on 02/15/2021) 90 tablet 3   No facility-administered medications prior to visit.    Allergies  Allergen Reactions   Ace Inhibitors Anaphylaxis    Bowel angioedema   Cheese Nausea And Vomiting   Red Dye Nausea And Vomiting   Tuna [Fish Allergy] Nausea And Vomiting    Review of Systems  Constitutional:  Negative for fever.  HENT:  Negative for congestion, ear pain, hearing loss, sinus pain and sore throat.   Eyes:  Negative for blurred vision and pain.  Respiratory:  Negative for cough, sputum production, shortness of breath and wheezing.   Cardiovascular:  Negative for chest pain and palpitations.  Gastrointestinal:  Negative for blood in stool, constipation, diarrhea, nausea and vomiting.  Genitourinary:  Negative for dysuria, frequency, hematuria and urgency.  Musculoskeletal:  Positive for joint pain (bilateral knee). Negative for back pain, falls and myalgias.  Neurological:  Positive for headaches. Negative for dizziness, sensory change, loss of consciousness and weakness.  Endo/Heme/Allergies:  Negative for environmental allergies. Does not bruise/bleed easily.  Psychiatric/Behavioral:  Negative for depression and suicidal ideas. The patient is not nervous/anxious and does not have insomnia.       Objective:    Physical Exam Constitutional:      General: She is not in acute distress.    Appearance: Normal appearance. She is not ill-appearing.  HENT:     Head: Normocephalic and atraumatic.     Right Ear: Tympanic membrane, ear canal and  external ear normal.     Left Ear: Tympanic membrane, ear canal and external ear normal.  Eyes:     Extraocular Movements: Extraocular movements intact.     Pupils: Pupils are equal, round, and reactive to light.  Cardiovascular:     Rate and Rhythm: Normal rate and regular rhythm.     Pulses: Normal pulses.     Heart sounds: Normal heart sounds. No murmur heard.   No gallop.     Comments: Lymphoedema in legs bilaterally Pulmonary:     Effort: Pulmonary effort is normal. No respiratory distress.     Breath sounds: Normal breath sounds. No wheezing, rhonchi or rales.  Abdominal:     General: Bowel sounds are normal. There is no distension.     Palpations: Abdomen is soft. There is no mass.     Tenderness: There is no abdominal tenderness. There is no guarding or rebound.     Hernia: No hernia is present.  Musculoskeletal:     Cervical back: Normal range of motion and neck supple.  Lymphadenopathy:     Cervical: No cervical adenopathy.  Skin:    General: Skin is warm and dry.  Neurological:     Mental Status: She is alert and oriented to person, place, and time.  Psychiatric:        Behavior: Behavior normal.    BP 128/88 (BP Location: Left Arm, Patient Position: Sitting, Cuff Size: Normal)   Pulse 66   Temp 98.2 F (36.8 C) (Oral)   Resp 20   Ht 5\' 6"  (1.676 m)   Wt (!) 349 lb 6.4 oz (158.5 kg)   SpO2 99%   BMI 56.39 kg/m  Wt Readings from Last 3 Encounters:  02/15/21 (!) 349 lb 6.4 oz (158.5 kg)  12/31/20 (!) 320 lb (145.2 kg)  12/19/20 (!) 320 lb (145.2 kg)    Diabetic Foot Exam - Simple   No data filed    Lab Results  Component Value Date   WBC 2.7 (L) 10/08/2018   HGB 12.6 10/08/2018   HCT 39.1 10/08/2018   PLT 254 10/08/2018   GLUCOSE 93 06/19/2020   CHOL 178 06/19/2020   TRIG 36.0 06/19/2020   HDL 70.20 06/19/2020   LDLDIRECT 129.1 10/17/2009   LDLCALC 101 (H) 06/19/2020   ALT 25 06/19/2020   AST 16 06/19/2020   NA 140 06/19/2020   K 3.8  06/19/2020   CL 105 06/19/2020   CREATININE 0.97 06/19/2020   BUN 17 06/19/2020   CO2 31 06/19/2020   TSH 3.13 08/02/2019   INR 1.1 (H) 10/16/2014   HGBA1C 5.9 06/19/2020   MICROALBUR <0.7 04/07/2019    Lab Results  Component Value Date   TSH 3.13 08/02/2019   Lab Results  Component Value Date   WBC 2.7 (L) 10/08/2018   HGB 12.6 10/08/2018   HCT 39.1 10/08/2018   MCV 88.1 10/08/2018   PLT 254 10/08/2018   Lab Results  Component Value Date   NA 140 06/19/2020   K 3.8 06/19/2020   CO2 31 06/19/2020   GLUCOSE 93 06/19/2020   BUN 17 06/19/2020   CREATININE 0.97 06/19/2020   BILITOT 0.6 06/19/2020   ALKPHOS 95 06/19/2020   AST 16 06/19/2020   ALT 25 06/19/2020   PROT 6.8 06/19/2020   ALBUMIN 3.7 06/19/2020   CALCIUM 8.6 06/19/2020   ANIONGAP 5 04/22/2018   GFR 64.02 06/19/2020   Lab Results  Component Value Date   CHOL 178 06/19/2020   Lab Results  Component Value Date   HDL 70.20 06/19/2020   Lab Results  Component Value Date   LDLCALC 101 (H) 06/19/2020   Lab Results  Component Value Date   TRIG 36.0 06/19/2020   Lab Results  Component Value Date   CHOLHDL 3 06/19/2020   Lab Results  Component Value Date   HGBA1C 5.9 06/19/2020        Mammogram: Last completed on 08/22/2020. Results were normal. Repeat in 1 year. Colonoscopy: Last completed on 05/11/2017. The colon was normal. Repeat in 10 years. Dexa: Last completed on 02/23/2020. The patient is considered normal according to the [facility]. Repeat in 2 years.  Assessment & Plan:   Problem List Items Addressed This Visit       Unprioritized   Edema due to malnutrition ([place])   Hyperlipidemia associated with type 2 diabetes mellitus ([place])   Relevant Orders   Lipid panel   Microalbumin / creatinine urine ratio   TSH   DM (diabetes mellitus) type II, controlled, with peripheral vascular disorder ([place])    hgba1c to be checked , minimize simple carbs. Increase exercise as  tolerated. Continue current meds       Relevant Orders   Pneumococcal conjugate vaccine 20-valent (Prevnar 20) (Completed)   Hemoglobin A1c   CBC with Differential/Platelet   Comprehensive metabolic panel   Lipid panel   Microalbumin / creatinine urine ratio   Essential hypertension    Well controlled, no changes to meds. Encouraged heart healthy diet such as the DASH diet and exercise as tolerated.       Lymphedema    Compression stockings       Preventative health care - Primary    ghm utd Check labs  See avs      Uncontrolled type 2 diabetes  mellitus with hyperglycemia ([place])    hgba1c to be checked, minimize simple carbs. Increase exercise as tolerated. Continue current meds       Relevant Orders   Hemoglobin A1c   CBC with Differential/Platelet   Comprehensive metabolic panel   Lipid panel   Microalbumin / creatinine urine ratio   Other Visit Diagnoses     Need for influenza vaccination       Relevant Orders   Flu Vaccine QUAD 26mo+IM (Fluarix, Fluzone & Alfiuria Quad PF) (Completed)   Seasonal allergies       Relevant Medications   fluticasone (FLONASE) 50 MCG/ACT nasal spray   loratadine (CLARITIN) 10 MG tablet   Gastroesophageal reflux disease       Relevant Medications   pantoprazole (PROTONIX) 40 MG tablet       Meds ordered this encounter  Medications   fluticasone (FLONASE) 50 MCG/ACT nasal spray    Sig: Place 1 spray into both nostrils in the morning and at bedtime.    Dispense:  1 g    Refill:  6   loratadine (CLARITIN) 10 MG tablet    Sig: Take 1 tablet (10 mg total) by mouth daily.    Dispense:  30 tablet    Refill:  11   pantoprazole (PROTONIX) 40 MG tablet    Sig: Take 2 tablets (80 mg total) by mouth daily.    Dispense:  180 tablet    Refill:  3    Requesting 1 year supply    I,Tammy [name],acting as a scribe for [employer], DO.,have documented all relevant documentation on the behalf of [name], DO,as  directed by  [name], DO while in the presence of [name], DO.   I, [name], DO., personally preformed the services described in this documentation.  All medical record entries made by the scribe were at my direction and in my presence.  I have reviewed the chart and discharge instructions (if applicable) and agree that the record reflects my personal performance and is accurate and complete. 02/15/2021

## 2021-02-15 NOTE — Assessment & Plan Note (Addendum)
ghm utd Check labs  See avs  

## 2021-02-15 NOTE — Patient Instructions (Signed)
Preventive Care 40-60 Years Old, Female Preventive care refers to lifestyle choices and visits with your health care provider that can promote health and wellness. This includes: A yearly physical exam. This is also called an annual wellness visit. Regular dental and eye exams. Immunizations. Screening for certain conditions. Healthy lifestyle choices, such as: Eating a healthy diet. Getting regular exercise. Not using drugs or products that contain nicotine and tobacco. Limiting alcohol use. What can I expect for my preventive care visit? Physical exam Your health care provider will check your: Height and weight. These may be used to calculate your BMI (body mass index). BMI is a measurement that tells if you are at a healthy weight. Heart rate and blood pressure. Body temperature. Skin for abnormal spots. Counseling Your health care provider may ask you questions about your: Past medical problems. Family's medical history. Alcohol, tobacco, and drug use. Emotional well-being. Home life and relationship well-being. Sexual activity. Diet, exercise, and sleep habits. Work and work environment. Access to firearms. Method of birth control. Menstrual cycle. Pregnancy history. What immunizations do I need? Vaccines are usually given at various ages, according to a schedule. Your health care provider will recommend vaccines for you based on your age, medical history, and lifestyle or other factors, such as travel or where you work. What tests do I need? Blood tests Lipid and cholesterol levels. These may be checked every 5 years, or more often if you are over 60 years old. Hepatitis C test. Hepatitis B test. Screening Lung cancer screening. You may have this screening every year starting at age 60 if you have a 30-pack-year history of smoking and currently smoke or have quit within the past 15 years. Colorectal cancer screening. All adults should have this screening starting at  age 60 and continuing until age 75. Your health care provider may recommend screening at age 60 if you are at increased risk. You will have tests every 1-10 years, depending on your results and the type of screening test. Diabetes screening. This is done by checking your blood sugar (glucose) after you have not eaten for a while (fasting). You may have this done every 1-3 years. Mammogram. This may be done every 1-2 years. Talk with your health care provider about when you should start having regular mammograms. This may depend on whether you have a family history of breast cancer. BRCA-related cancer screening. This may be done if you have a family history of breast, ovarian, tubal, or peritoneal cancers. Pelvic exam and Pap test. This may be done every 3 years starting at age 60. Starting at age 60, this may be done every 5 years if you have a Pap test in combination with an HPV test. Other tests STD (sexually transmitted disease) testing, if you are at risk. Bone density scan. This is done to screen for osteoporosis. You may have this scan if you are at high risk for osteoporosis. Talk with your health care provider about your test results, treatment options, and if necessary, the need for more tests. Follow these instructions at home: Eating and drinking  Eat a diet that includes fresh fruits and vegetables, whole grains, lean protein, and low-fat dairy products. Take vitamin and mineral supplements as recommended by your health care provider. Do not drink alcohol if: Your health care provider tells you not to drink. You are pregnant, may be pregnant, or are planning to become pregnant. If you drink alcohol: Limit how much you have to 0-1 drink a day. Be   aware of how much alcohol is in your drink. In the [place] one drink equals one 12 oz bottle of beer (355 mL), one 5 oz glass of wine (148 mL), or one 1 oz glass of hard liquor (44 mL). Lifestyle Take daily care of your teeth and  gums. Brush your teeth every morning and night with fluoride toothpaste. Floss one time each day. Stay active. Exercise for at least 30 minutes 5 or more days each week. Do not use any products that contain nicotine or tobacco, such as cigarettes, e-cigarettes, and chewing tobacco. If you need help quitting, ask your health care provider. Do not use drugs. If you are sexually active, practice safe sex. Use a condom or other form of protection to prevent STIs (sexually transmitted infections). If you do not wish to become pregnant, use a form of birth control. If you plan to become pregnant, see your health care provider for a prepregnancy visit. If told by your health care provider, take low-dose aspirin daily starting at age 60. Find healthy ways to cope with stress, such as: Meditation, yoga, or listening to music. Journaling. Talking to a trusted person. Spending time with friends and family. Safety Always wear your seat belt while driving or riding in a vehicle. Do not drive: If you have been drinking alcohol. Do not ride with someone who has been drinking. When you are tired or distracted. While texting. Wear a helmet and other protective equipment during sports activities. If you have firearms in your house, make sure you follow all gun safety procedures. What's next? Visit your health care provider once a year for an annual wellness visit. Ask your health care provider how often you should have your eyes and teeth checked. Stay up to date on all vaccines. This information is not intended to replace advice given to you by your health care provider. Make sure you discuss any questions you have with your health care provider. Document Revised: 06/29/2020 Document Reviewed: 12/31/2017 Elsevier Patient Education  2022 [employer].

## 2021-02-20 ENCOUNTER — Ambulatory Visit (INDEPENDENT_AMBULATORY_CARE_PROVIDER_SITE_OTHER): Payer: 59

## 2021-02-20 ENCOUNTER — Ambulatory Visit (INDEPENDENT_AMBULATORY_CARE_PROVIDER_SITE_OTHER): Payer: 59 | Admitting: Podiatry

## 2021-02-20 ENCOUNTER — Encounter: Payer: Self-pay | Admitting: Podiatry

## 2021-02-20 ENCOUNTER — Other Ambulatory Visit: Payer: Self-pay

## 2021-02-20 DIAGNOSIS — M779 Enthesopathy, unspecified: Secondary | ICD-10-CM | POA: Diagnosis not present

## 2021-02-20 DIAGNOSIS — M7671 Peroneal tendinitis, right leg: Secondary | ICD-10-CM

## 2021-02-20 MED ORDER — TRIAMCINOLONE ACETONIDE 10 MG/ML IJ SUSP
10.0000 mg | Freq: Once | INTRAMUSCULAR | Status: AC
  Administered 2021-02-20: 10 mg

## 2021-02-20 NOTE — Progress Notes (Signed)
Subjective:   Patient ID: Tammy [name], female   [name]: 60 y.o.   MRN: [id number]   HPI Patient presents with a lot of pain in the outside of the right foot with obesity is complicating factors and works on cement type floors   ROS      Objective:  Physical Exam  Neurovascular status intact with the left foot doing well currently with the right outside foot being sore base of fifth metatarsal     Assessment:  Improved capsulitis like condition fasciitis but does have what appears to be peroneal tendinitis right     Plan:  H&P reviewed condition sterile prep done and injected the fifth metatarsal base 3 mg Dexasone Kenalog 5 mg Xylocaine advised on ice support reappoint to recheck  X-rays did indicate quite a bit of depression of the arch but I did not note fracture or any acute injury associated with this new discomfort

## 2021-02-25 ENCOUNTER — Ambulatory Visit (INDEPENDENT_AMBULATORY_CARE_PROVIDER_SITE_OTHER): Payer: 59 | Admitting: Family Medicine

## 2021-02-25 VITALS — BP 157/78 | Ht 65.0 in | Wt 328.0 lb

## 2021-02-25 DIAGNOSIS — M1711 Unilateral primary osteoarthritis, right knee: Secondary | ICD-10-CM

## 2021-02-25 MED ORDER — METHYLPREDNISOLONE ACETATE 40 MG/ML IJ SUSP
40.0000 mg | Freq: Once | INTRAMUSCULAR | Status: AC
  Administered 2021-02-25: 40 mg via INTRA_ARTICULAR

## 2021-02-25 NOTE — Patient Instructions (Signed)
We discussed the three things that typically make your knee have this giving out/buckling type sensation. Your knee ligaments, meniscus testing are normal suggesting it's reflexive buckling from arthritis (the pothole thing we discussed). Continue with physical therapy, focus on quad strengthening exercises. Consider steroid injection today. Tylenol 500mg  1-2 tabs three times a day regularly while on your trip even if we do the shot today. I hope you have a great vacation!

## 2021-02-26 ENCOUNTER — Encounter: Payer: Self-pay | Admitting: Family Medicine

## 2021-02-26 NOTE — Progress Notes (Signed)
PCP: [name], DO  Subjective:   HPI: Patient is a 60 y.o. female here for right knee pain.  Patient has history of known arthritis of right knee. Has done steroid injections and visco series in past. Overall doing well up until past week when pain in right knee worsened but primary issue is feeling this knee is going to give out and buckle. Taking tylenol occasionally. Overall walking better since last visit though. Leaving this week for [facility] and will be visiting/hiking in the [place].  Past Medical History:  Diagnosis Date   Acute on chronic appendicitis s/p lap appy 06/10/2012 06/10/2012   surgery done 06-10-12   Arthritis    ankle   Back pain    Chest pain    Complication of anesthesia    Constipation    Diabetes mellitus    TYPE 2- no meds in several months-was taken off meds -monitoring blood sugar.   GERD (gastroesophageal reflux disease)    History of colon polyps    Hyperlipidemia    Hypertension    Joint pain    Lactose intolerance    Morbid obesity - BMI > 50 12/13/2012   Pneumonia    none recent   PONV (postoperative nausea and vomiting)    Poor venous access    "usually difficult stick"   Prediabetes    Sinusitis    SOB (shortness of breath)    Swelling    Vitamin D deficiency     Current Outpatient Medications on File Prior to Visit  Medication Sig Dispense Refill   amLODipine (NORVASC) 2.5 MG tablet Take 1 tablet (2.5 mg total) by mouth 2 (two) times daily. 180 tablet 3   carvedilol (COREG) 6.25 MG tablet TAKE 1 TABLET BY MOUTH  TWICE DAILY WITH MEALS 180 tablet 3   fluticasone (FLONASE) 50 MCG/ACT nasal spray Place 1 spray into both nostrils in the morning and at bedtime. 1 g 6   furosemide (LASIX) 40 MG tablet TAKE 1 TABLET BY MOUTH  DAILY 90 tablet 1   glucose blood test strip Check blood sugar twice daily 100 each 11   loratadine (CLARITIN) 10 MG tablet Take 1 tablet (10 mg total) by mouth daily. 30 tablet 11   meloxicam (MOBIC)  15 MG tablet Take 1 tablet daily with food for 14 days. Then take as needed. 30 tablet 1   Multiple Vitamins-Minerals (MULTIVITAMIN ADULT PO) Take 1 tablet by mouth daily. flintstone vitamins     NONFORMULARY OR COMPOUNDED ITEM Thigh high compression socks  20-30 mmhg    Re- low ext edema 1 each 1   pantoprazole (PROTONIX) 40 MG tablet Take 2 tablets (80 mg total) by mouth daily. 180 tablet 3   No current facility-administered medications on file prior to visit.    Past Surgical History:  Procedure Laterality Date   ABDOMINAL HYSTERECTOMY     APPENDECTOMY     2'14   COLONOSCOPY  04/11/11   5 mm polyp removed but not recovered   COLONOSCOPY WITH PROPOFOL N/A 05/11/2017   Procedure: COLONOSCOPY WITH PROPOFOL;  Surgeon: Gatha Mayer, MD;  Location: [facility];  Service: Endoscopy;  Laterality: N/A;   GASTRIC ROUX-EN-Y N/A 02/27/2015   Procedure: LAPAROSCOPIC ROUX-EN-Y GASTRIC BYPASS WITH UPPER ENDOSCOPY;  Surgeon: Johnathan Hausen, MD;  Location: [facility];  Service: General;  Laterality: N/A;   KNEE SURGERY Right     KNEE SURGERY    LAPAROSCOPIC APPENDECTOMY  06/10/2012   Procedure: APPENDECTOMY  LAPAROSCOPIC;  Surgeon: Adin Hector, MD;  Location: [facility];  Service: General;  Laterality: N/A;   UPPER GASTROINTESTINAL ENDOSCOPY  04/14/2006   Normal    Allergies  Allergen Reactions   Ace Inhibitors Anaphylaxis    Bowel angioedema   Cheese Nausea And Vomiting   Red Dye Nausea And Vomiting   Tuna [Fish Allergy] Nausea And Vomiting    BP (!) 157/78   Ht 5\' 5"  (1.651 m)   Wt (!) 328 lb (148.8 kg)   BMI 54.58 kg/m   No flowsheet data found.  No flowsheet data found.      Objective:  Physical Exam:  Gen: NAD, comfortable in exam room  Right knee: No gross deformity, ecchymoses, swelling. TTP medial joint line. FROM with normal strength. Negative ant/post drawers. Negative valgus/varus testing. Negative lachman.  Negative mcmurrays, apleys.  NV intact distally.    Assessment & Plan:  1. Right knee pain - 2/2 flare of arthritis.  Meniscus testing and ligamentous testing reassuring.  Tylenol regularly while on her trip.  Steroid injection given today.   After informed written consent timeout was performed, patient was lying supine on exam table. Right knee was prepped with alcohol swab and utilizing superolateral approach with ultrasound guidance, patient's right knee was injected intraarticularly with 3:1 lidocaine: depomedrol. Patient tolerated the procedure well without immediate complications.

## 2021-03-06 ENCOUNTER — Encounter: Payer: Self-pay | Admitting: Family Medicine

## 2021-03-11 ENCOUNTER — Other Ambulatory Visit: Payer: Self-pay | Admitting: Family Medicine

## 2021-03-11 DIAGNOSIS — I10 Essential (primary) hypertension: Secondary | ICD-10-CM

## 2021-03-12 ENCOUNTER — Other Ambulatory Visit: Payer: Self-pay

## 2021-03-12 ENCOUNTER — Encounter: Payer: Self-pay | Admitting: Family Medicine

## 2021-03-12 ENCOUNTER — Ambulatory Visit (INDEPENDENT_AMBULATORY_CARE_PROVIDER_SITE_OTHER): Payer: 59 | Admitting: Family Medicine

## 2021-03-12 VITALS — BP 130/90 | HR 76 | Temp 98.2°F | Resp 20 | Ht 65.0 in | Wt 346.2 lb

## 2021-03-12 DIAGNOSIS — J189 Pneumonia, unspecified organism: Secondary | ICD-10-CM

## 2021-03-12 NOTE — Patient Instructions (Signed)
Community-Acquired Pneumonia, Adult ?Pneumonia is a lung infection that causes inflammation and the buildup of mucus and fluids in the lungs. This may cause coughing and difficulty breathing. Community-acquired pneumonia is pneumonia that develops in people who are not, and have not recently been, in a hospital or other health care facility. ?Usually, pneumonia develops as a result of an illness that is caused by a virus, such as the common cold and the flu (influenza). It can also be caused by bacteria or fungi. While the common cold and influenza can pass from person to person (are contagious), pneumonia itself is not considered contagious. ?What are the causes? ?This condition may be caused by: ?Viruses. ?Bacteria. ?Fungi, such as molds or mushrooms. ?What increases the risk? ?The following factors may make you more likely to develop this condition: ?Having certain medical conditions, such as: ?A long-term (chronic) disease, which may include chronic obstructive pulmonary disease (COPD), asthma, heart failure, cystic fibrosis, diabetes, kidney disease, sickle cell disease, and human immunodeficiency virus (HIV). ?A condition that increases the risk of breathing in (aspirating) mucus and other fluids from your mouth and nose. ?A weakened body defense system (immune system). ?Having had your spleen removed (splenectomy). The spleen is the organ that helps fight germs and infections. ?Not cleaning your teeth and gums well (poor dental hygiene). ?Using tobacco products. ?Traveling to places where germs that cause pneumonia are present. ?Being near certain animals, or animal habitats, that have germs that cause pneumonia. ?Being older than 60 years of age. ?What are the signs or symptoms? ?Symptoms of this condition include: ?A dry cough or a wet (productive) cough. ?A fever. ?Sweating or chills. ?Chest pain, especially when breathing deeply or coughing. ?Fast breathing, difficulty breathing, or shortness of  breath. ?Tiredness (fatigue). ?Muscle aches. ?How is this diagnosed? ?This condition may be diagnosed based on your medical history or a physical exam. You may also have tests, including: ?Chest X-rays. ?Tests of the level of oxygen and other gases in your blood. ?Tests of: ?Your blood. ?Mucus from your lungs (sputum). ?Fluid around your lungs (pleural fluid). ?Your urine. ?If your pneumonia is severe, other tests may be done to learn more about the cause. ?How is this treated? ?Treatment for this condition depends on many factors, such as the cause of your pneumonia, your medicines, and other medical conditions that you have. ?For most adults, pneumonia may be treated at home. In some cases, treatment must happen in a hospital and may include: ?Medicines that are given by mouth (orally) or through an IV, including: ?Antibiotic medicines, if bacteria caused the pneumonia. ?Medicines that kill viruses (antiviral medicines), if a virus caused the pneumonia. ?Oxygen therapy. ?Severe pneumonia, although rare, may require the following treatments: ?Mechanical ventilation.This procedure uses a machine to help you breathe if you cannot breathe well on your own or maintain a safe level of blood oxygen. ?Thoracentesis. This procedure removes any buildup of pleural fluid to help with breathing. ?Follow these instructions at home: ?Medicines ?Take over-the-counter and prescription medicines only as told by your health care provider. ?Take cough medicine only if you have trouble sleeping. Cough medicine can prevent your body from removing mucus from your lungs. ?If you were prescribed an antibiotic medicine, take it as told by your health care provider. Do not stop taking the antibiotic even if you start to feel better. ?Lifestyle ?  ?Do not drink alcohol. ?Do not use any products that contain nicotine or tobacco, such as cigarettes, e-cigarettes, and chewing tobacco. If you   need help quitting, ask your health care  provider. ?Eat a healthy diet. This includes plenty of vegetables, fruits, whole grains, low-fat dairy products, and lean protein. ?General instructions ?Rest a lot and get at least 8 hours of sleep each night. ?Sleep in a partly upright position at night. Place a few pillows under your head or sleep in a reclining chair. ?Return to your normal activities as told by your health care provider. Ask your health care provider what activities are safe for you. ?Drink enough fluid to keep your urine pale yellow. This helps to thin the mucus in your lungs. ?If your throat is sore, gargle with a salt-water mixture 3-4 times a day or as needed. To make a salt-water mixture, completely dissolve ?-1 tsp (3-6 g) of salt in 1 cup (237 mL) of warm water. ?Keep all follow-up visits as told by your health care provider. This is important. ?How is this prevented? ?You can lower your risk of developing community-acquired pneumonia by: ?Getting the pneumonia vaccine. There are different types and schedules of pneumonia vaccines. Ask your health care provider which option is best for you. Consider getting the pneumonia vaccine if: ?You are older than 60 years of age. ?You are 19-65 years of age and are receiving cancer treatment, have chronic lung disease, or have other medical conditions that affect your immune system. Ask your health care provider if this applies to you. ?Getting your influenza vaccine every year. Ask your health care provider which type of vaccine is best for you. ?Getting regular dental checkups. ?Washing your hands often with soap and water for at least 20 seconds. If soap and water are not available, use hand sanitizer. ?Contact a health care provider if you have: ?A fever. ?Trouble sleeping because you cannot control your cough with cough medicine. ?Get help right away if: ?Your shortness of breath becomes worse. ?Your chest pain increases. ?Your sickness becomes worse, especially if you are an older adult or  have a weak immune system. ?You cough up blood. ?These symptoms may represent a serious problem that is an emergency. Do not wait to see if the symptoms will go away. Get medical help right away. Call your local emergency services (911 in the [place] Do not drive yourself to the hospital. ?Summary ?Pneumonia is an infection of the lungs. ?Community-acquired pneumonia develops in people who have not been in the hospital. It can be caused by bacteria, viruses, or fungi. ?This condition may be treated with antibiotics or antiviral medicines. ?Severe pneumonia may require a hospital stay and treatment to help with breathing. ?This information is not intended to replace advice given to you by your health care provider. Make sure you discuss any questions you have with your health care provider. ?Document Revised: 02/01/2019 Document Reviewed: 02/01/2019 ?Elsevier Patient Education ? 2022 Elsevier Inc. ? ?

## 2021-03-12 NOTE — Assessment & Plan Note (Signed)
Improving Recheck xray if symptoms do not completely resolve in another week We will get records from er in [place]
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
## 2021-04-09 ENCOUNTER — Ambulatory Visit (INDEPENDENT_AMBULATORY_CARE_PROVIDER_SITE_OTHER): Payer: 59 | Admitting: Family Medicine

## 2021-04-09 ENCOUNTER — Other Ambulatory Visit: Payer: Self-pay

## 2021-04-09 ENCOUNTER — Encounter: Payer: Self-pay | Admitting: Family Medicine

## 2021-04-09 ENCOUNTER — Ambulatory Visit (HOSPITAL_BASED_OUTPATIENT_CLINIC_OR_DEPARTMENT_OTHER)
Admission: RE | Admit: 2021-04-09 | Discharge: 2021-04-09 | Disposition: A | Discharge status: Home / Self Care | Payer: 59 | Source: Ambulatory Visit | Attending: Family Medicine | Admitting: Family Medicine

## 2021-04-09 VITALS — BP 136/88 | HR 68 | Temp 99.0°F | Resp 18 | Ht 65.0 in | Wt 354.4 lb

## 2021-04-09 DIAGNOSIS — R051 Acute cough: Secondary | ICD-10-CM

## 2021-04-09 DIAGNOSIS — Z8701 Personal history of pneumonia (recurrent): Secondary | ICD-10-CM | POA: Diagnosis not present

## 2021-04-09 LAB — CBC WITH DIFFERENTIAL/PLATELET
Basophils Absolute: 0 10*3/uL (ref 0.0–0.1)
Basophils Relative: 0.8 % (ref 0.0–3.0)
Eosinophils Absolute: 0.1 10*3/uL (ref 0.0–0.7)
Eosinophils Relative: 1.8 % (ref 0.0–5.0)
HCT: 37.4 % (ref 36.0–46.0)
Hemoglobin: 12.1 g/dL (ref 12.0–15.0)
Lymphocytes Relative: 34.1 % (ref 12.0–46.0)
Lymphs Abs: 1.1 10*3/uL (ref 0.7–4.0)
MCHC: 32.4 g/dL (ref 30.0–36.0)
MCV: 88.3 fl (ref 78.0–100.0)
Monocytes Absolute: 0.2 10*3/uL (ref 0.1–1.0)
Monocytes Relative: 7.8 % (ref 3.0–12.0)
Neutro Abs: 1.8 10*3/uL (ref 1.4–7.7)
Neutrophils Relative %: 55.5 % (ref 43.0–77.0)
Platelets: 255 10*3/uL (ref 150.0–400.0)
RBC: 4.24 Mil/uL (ref 3.87–5.11)
RDW: 14.8 % (ref 11.5–15.5)
WBC: 3.2 10*3/uL — ABNORMAL LOW (ref 4.0–10.5)

## 2021-04-09 LAB — POC INFLUENZA A&B (BINAX/QUICKVUE)
Influenza A, POC: NEGATIVE
Influenza B, POC: NEGATIVE

## 2021-04-09 LAB — COMPREHENSIVE METABOLIC PANEL
ALT: 21 U/L (ref 0–35)
AST: 21 U/L (ref 0–37)
Albumin: 3.8 g/dL (ref 3.5–5.2)
Alkaline Phosphatase: 100 U/L (ref 39–117)
BUN: 14 mg/dL (ref 6–23)
CO2: 29 mEq/L (ref 19–32)
Calcium: 8.6 mg/dL (ref 8.4–10.5)
Chloride: 106 mEq/L (ref 96–112)
Creatinine, Ser: 0.89 mg/dL (ref 0.40–1.20)
GFR: 70.59 mL/min (ref >60.00)
Glucose, Bld: 95 mg/dL (ref 70–99)
Potassium: 3.9 mEq/L (ref 3.5–5.1)
Sodium: 142 mEq/L (ref 135–145)
Total Bilirubin: 0.6 mg/dL (ref 0.2–1.2)
Total Protein: 6.7 g/dL (ref 6.0–8.3)

## 2021-04-09 LAB — POC COVID19 BINAXNOW: SARS Coronavirus 2 Ag: NEGATIVE

## 2021-04-09 MED ORDER — PROMETHAZINE-DM 6.25-15 MG/5ML PO SYRP: 5.0000 mL | ORAL_SOLUTION | Freq: Four times a day (QID) | ORAL | 0 refills | Status: DC | PRN

## 2021-04-09 MED ORDER — LEVOFLOXACIN 500 MG PO TABS: 500.0000 mg | ORAL_TABLET | Freq: Every day | ORAL | 0 refills | Status: AC

## 2021-04-09 NOTE — Assessment & Plan Note (Signed)
Recheck xray  levaquin and cough med F/u 2 weeks or sooner prn

## 2021-04-09 NOTE — Progress Notes (Signed)
Subjective:   By signing my name below, I, [name], attest that this documentation has been prepared under the direction and in the presence of Dr. Roma Schanz, DO. 04/09/2021    Patient ID: Tammy [name], female    [name]: 1961/02/08, 60 y.o.   MRN: [id number]  Chief Complaint  Patient presents with   Cough    X1 week, non productive, SOB, no chest pain or palpitations, no fever, pt reports chills. No recent COVID test.     Cough Associated symptoms include chills and shortness of breath. Pertinent negatives include no chest pain, fever, headaches or rash.  Patient is in today for a office visit.   She complains of cough, SOB and chills. She denies having any fever. She is taking OTC Tylenol to manage her symptoms. She thinks she may have contracted her cough from her work. She did not test for Covid-19 or Flu.  She does not think her pneumonia ever completely cleared.    Past Medical History:  Diagnosis Date   Acute on chronic appendicitis s/p lap appy 06/10/2012 06/10/2012   surgery done 06-10-12   Arthritis    ankle   Back pain    Chest pain    Complication of anesthesia    Constipation    Diabetes mellitus    TYPE 2- no meds in several months-was taken off meds -monitoring blood sugar.   GERD (gastroesophageal reflux disease)    History of colon polyps    Hyperlipidemia    Hypertension    Joint pain    Lactose intolerance    Morbid obesity - BMI > 50 12/13/2012   Pneumonia    none recent   PONV (postoperative nausea and vomiting)    Poor venous access    "usually difficult stick"   Prediabetes    Sinusitis    SOB (shortness of breath)    Swelling    Vitamin D deficiency     Past Surgical History:  Procedure Laterality Date   ABDOMINAL HYSTERECTOMY     APPENDECTOMY     2'14   COLONOSCOPY  04/11/11   5 mm polyp removed but not recovered   COLONOSCOPY WITH PROPOFOL N/A 05/11/2017   Procedure: COLONOSCOPY WITH PROPOFOL;  Surgeon: Gatha Mayer, MD;   Location: [facility];  Service: Endoscopy;  Laterality: N/A;   GASTRIC ROUX-EN-Y N/A 02/27/2015   Procedure: LAPAROSCOPIC ROUX-EN-Y GASTRIC BYPASS WITH UPPER ENDOSCOPY;  Surgeon: Johnathan Hausen, MD;  Location: [facility];  Service: General;  Laterality: N/A;   KNEE SURGERY Right     KNEE SURGERY    LAPAROSCOPIC APPENDECTOMY  06/10/2012   Procedure: APPENDECTOMY LAPAROSCOPIC;  Surgeon: Adin Hector, MD;  Location: [facility];  Service: General;  Laterality: N/A;   UPPER GASTROINTESTINAL ENDOSCOPY  04/14/2006   Normal    Family History  Problem Relation Age of Onset   Hyperlipidemia Mother    Hypertension Mother    Cancer Mother 33       hodgkins lymphoma   Diabetes Father    Heart failure Father    Heart disease Father    Hyperlipidemia Brother    Diabetes Paternal Grandmother    Breast cancer Paternal Aunt        unsure of age   Breast cancer Paternal Aunt    Heart disease Cousin    Colon cancer Neg Hx    Heart attack Neg Hx    Sudden death Neg Hx    Colon polyps Neg Hx  Esophageal cancer Neg Hx    Stomach cancer Neg Hx    Rectal cancer Neg Hx     Social History   Socioeconomic History   Marital status: Married    Spouse name: Publishing rights manager   Number of children: 0   Years of education: Not on file   Highest education level: Not on file  Occupational History   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[employer]  Tobacco Use   Smoking status: Never   Smokeless tobacco: Never  Vaping Use   Vaping Use: Never used  Substance and Sexual Activity   Alcohol use: No    Alcohol/week: 0.0 standard drinks   Drug use: No   Sexual activity: Not on file  Other Topics Concern   Not on file  Social History Narrative   Exercise- no   Social Determinants of Health   Financial Resource Strain: Not on file  Food Insecurity: Not on file  Transportation Needs: Not on file  Physical Activity: Not on file  Stress: Not on file  Social Connections: Not on file  Intimate  Partner Violence: Not on file    Outpatient Medications Prior to Visit  Medication Sig Dispense Refill   amLODipine (NORVASC) 2.5 MG tablet TAKE 1 TABLET BY MOUTH  TWICE DAILY 180 tablet 1   carvedilol (COREG) 6.25 MG tablet TAKE 1 TABLET BY MOUTH  TWICE DAILY WITH MEALS 180 tablet 3   fluticasone (FLONASE) 50 MCG/ACT nasal spray Place 1 spray into both nostrils in the morning and at bedtime. 1 g 6   furosemide (LASIX) 40 MG tablet TAKE 1 TABLET BY MOUTH  DAILY 90 tablet 1   glucose blood test strip Check blood sugar twice daily 100 each 11   loratadine (CLARITIN) 10 MG tablet Take 1 tablet (10 mg total) by mouth daily. 30 tablet 11   meloxicam (MOBIC) 15 MG tablet Take 1 tablet daily with food for 14 days. Then take as needed. 30 tablet 1   Multiple Vitamins-Minerals (MULTIVITAMIN ADULT PO) Take 1 tablet by mouth daily. flintstone vitamins     NONFORMULARY OR COMPOUNDED ITEM Thigh high compression socks  20-30 mmhg    Re- low ext edema 1 each 1   pantoprazole (PROTONIX) 40 MG tablet Take 2 tablets (80 mg total) by mouth daily. 180 tablet 3   No facility-administered medications prior to visit.    Allergies  Allergen Reactions   Ace Inhibitors Anaphylaxis    Bowel angioedema   Cheese Nausea And Vomiting   Red Dye Nausea And Vomiting   Tuna [Fish Allergy] Nausea And Vomiting    Review of Systems  Constitutional:  Positive for chills. Negative for fever and malaise/fatigue.  HENT:  Negative for congestion.   Eyes:  Negative for blurred vision.  Respiratory:  Positive for cough and shortness of breath.   Cardiovascular:  Negative for chest pain, palpitations and leg swelling.  Gastrointestinal:  Negative for vomiting.  Musculoskeletal:  Negative for back pain.  Skin:  Negative for rash.  Neurological:  Negative for loss of consciousness and headaches.      Objective:    Physical Exam Vitals and nursing note reviewed.  Constitutional:      General: She is not in acute  distress.    Appearance: Normal appearance. She is well-developed. She is not ill-appearing.     Comments: Negative Covid-19 and Flu test  HENT:     Head: Normocephalic and atraumatic.     Right Ear:  External ear normal.     Left Ear: External ear normal.  Eyes:     Extraocular Movements: Extraocular movements intact.     Conjunctiva/sclera: Conjunctivae normal.     Pupils: Pupils are equal, round, and reactive to light.  Neck:     Thyroid: No thyromegaly.     Vascular: No carotid bruit or JVD.  Cardiovascular:     Rate and Rhythm: Normal rate and regular rhythm.     Heart sounds: Normal heart sounds. No murmur heard.   No gallop.  Pulmonary:     Effort: Pulmonary effort is normal. No respiratory distress.     Breath sounds: Normal breath sounds. No wheezing or rales.     Comments: O2 levels measured 98% Chest:     Chest wall: No tenderness.  Musculoskeletal:     Cervical back: Normal range of motion and neck supple.  Skin:    General: Skin is warm and dry.  Neurological:     Mental Status: She is alert and oriented to person, place, and time.  Psychiatric:        Behavior: Behavior normal.        Judgment: Judgment normal.    BP 136/88 (BP Location: Left Arm, Patient Position: Sitting, Cuff Size: Large)   Pulse 68   Temp 99 F (37.2 C) (Oral)   Resp 18   Ht 5\' 5"  (1.651 m)   Wt (!) 354 lb 6.4 oz (160.8 kg)   SpO2 98%   BMI 58.98 kg/m  Wt Readings from Last 3 Encounters:  04/09/21 (!) 354 lb 6.4 oz (160.8 kg)  03/12/21 (!) 346 lb 3.2 oz (157 kg)  02/25/21 (!) 328 lb (148.8 kg)    Diabetic Foot Exam - Simple   No data filed    Lab Results  Component Value Date   WBC 2.9 (L) 02/15/2021   HGB 11.7 (L) 02/15/2021   HCT 36.2 02/15/2021   PLT 230.0 02/15/2021   GLUCOSE 95 02/15/2021   CHOL 187 02/15/2021   TRIG 56.0 02/15/2021   HDL 71.90 02/15/2021   LDLDIRECT 129.1 10/17/2009   LDLCALC 104 (H) 02/15/2021   ALT 21 02/15/2021   AST 25 02/15/2021   NA 139  02/15/2021   K 3.7 02/15/2021   CL 102 02/15/2021   CREATININE 0.93 02/15/2021   BUN 10 02/15/2021   CO2 30 02/15/2021   TSH 2.26 02/15/2021   INR 1.1 (H) 10/16/2014   HGBA1C 6.1 02/15/2021   MICROALBUR <0.7 02/15/2021    Lab Results  Component Value Date   TSH 2.26 02/15/2021   Lab Results  Component Value Date   WBC 2.9 (L) 02/15/2021   HGB 11.7 (L) 02/15/2021   HCT 36.2 02/15/2021   MCV 87.9 02/15/2021   PLT 230.0 02/15/2021   Lab Results  Component Value Date   NA 139 02/15/2021   K 3.7 02/15/2021   CO2 30 02/15/2021   GLUCOSE 95 02/15/2021   BUN 10 02/15/2021   CREATININE 0.93 02/15/2021   BILITOT 0.7 02/15/2021   ALKPHOS 86 02/15/2021   AST 25 02/15/2021   ALT 21 02/15/2021   PROT 6.9 02/15/2021   ALBUMIN 3.9 02/15/2021   CALCIUM 8.7 02/15/2021   ANIONGAP 5 04/22/2018   GFR 67.03 02/15/2021   Lab Results  Component Value Date   CHOL 187 02/15/2021   Lab Results  Component Value Date   HDL 71.90 02/15/2021   Lab Results  Component Value Date   LDLCALC 104 (H)  02/15/2021   Lab Results  Component Value Date   TRIG 56.0 02/15/2021   Lab Results  Component Value Date   CHOLHDL 3 02/15/2021   Lab Results  Component Value Date   HGBA1C 6.1 02/15/2021       Assessment & Plan:   Problem List Items Addressed This Visit       Unprioritized   Acute cough - Primary   Relevant Medications   levofloxacin (LEVAQUIN) 500 MG tablet   promethazine-dextromethorphan (PROMETHAZINE-DM) 6.25-15 MG/5ML syrup   Other Relevant Orders   POC COVID-19 (Completed)   POC Influenza A&B (Binax test) (Completed)   DG Chest 2 View   History of pneumonia    Recheck xray  levaquin and cough med F/u 2 weeks or sooner prn       Relevant Medications   levofloxacin (LEVAQUIN) 500 MG tablet   Other Relevant Orders   CBC with Differential/Platelet   Comprehensive metabolic panel     Meds ordered this encounter  Medications   levofloxacin (LEVAQUIN) 500 MG  tablet    Sig: Take 1 tablet (500 mg total) by mouth daily for 7 days.    Dispense:  7 tablet    Refill:  0   promethazine-dextromethorphan (PROMETHAZINE-DM) 6.25-15 MG/5ML syrup    Sig: Take 5 mLs by mouth 4 (four) times daily as needed.    Dispense:  118 mL    Refill:  0    I, Dr. Roma Schanz, DO, personally preformed the services described in this documentation.  All medical record entries made by the scribe were at my direction and in my presence.  I have reviewed the chart and discharge instructions (if applicable) and agree that the record reflects my personal performance and is accurate and complete. 04/09/2021   I,[name],acting as a scribe for [name], DO.,have documented all relevant documentation on the behalf of [name], DO,as directed by  [name], DO while in the presence of [name], DO.   [name], DO

## 2021-04-09 NOTE — Patient Instructions (Signed)
Community-Acquired Pneumonia, Adult ?Pneumonia is a lung infection that causes inflammation and the buildup of mucus and fluids in the lungs. This may cause coughing and difficulty breathing. Community-acquired pneumonia is pneumonia that develops in people who are not, and have not recently been, in a hospital or other health care facility. ?Usually, pneumonia develops as a result of an illness that is caused by a virus, such as the common cold and the flu (influenza). It can also be caused by bacteria or fungi. While the common cold and influenza can pass from person to person (are contagious), pneumonia itself is not considered contagious. ?What are the causes? ?This condition may be caused by: ?Viruses. ?Bacteria. ?Fungi, such as molds or mushrooms. ?What increases the risk? ?The following factors may make you more likely to develop this condition: ?Having certain medical conditions, such as: ?A long-term (chronic) disease, which may include chronic obstructive pulmonary disease (COPD), asthma, heart failure, cystic fibrosis, diabetes, kidney disease, sickle cell disease, and human immunodeficiency virus (HIV). ?A condition that increases the risk of breathing in (aspirating) mucus and other fluids from your mouth and nose. ?A weakened body defense system (immune system). ?Having had your spleen removed (splenectomy). The spleen is the organ that helps fight germs and infections. ?Not cleaning your teeth and gums well (poor dental hygiene). ?Using tobacco products. ?Traveling to places where germs that cause pneumonia are present. ?Being near certain animals, or animal habitats, that have germs that cause pneumonia. ?Being older than 60 years of age. ?What are the signs or symptoms? ?Symptoms of this condition include: ?A dry cough or a wet (productive) cough. ?A fever. ?Sweating or chills. ?Chest pain, especially when breathing deeply or coughing. ?Fast breathing, difficulty breathing, or shortness of  breath. ?Tiredness (fatigue). ?Muscle aches. ?How is this diagnosed? ?This condition may be diagnosed based on your medical history or a physical exam. You may also have tests, including: ?Chest X-rays. ?Tests of the level of oxygen and other gases in your blood. ?Tests of: ?Your blood. ?Mucus from your lungs (sputum). ?Fluid around your lungs (pleural fluid). ?Your urine. ?If your pneumonia is severe, other tests may be done to learn more about the cause. ?How is this treated? ?Treatment for this condition depends on many factors, such as the cause of your pneumonia, your medicines, and other medical conditions that you have. ?For most adults, pneumonia may be treated at home. In some cases, treatment must happen in a hospital and may include: ?Medicines that are given by mouth (orally) or through an IV, including: ?Antibiotic medicines, if bacteria caused the pneumonia. ?Medicines that kill viruses (antiviral medicines), if a virus caused the pneumonia. ?Oxygen therapy. ?Severe pneumonia, although rare, may require the following treatments: ?Mechanical ventilation.This procedure uses a machine to help you breathe if you cannot breathe well on your own or maintain a safe level of blood oxygen. ?Thoracentesis. This procedure removes any buildup of pleural fluid to help with breathing. ?Follow these instructions at home: ?Medicines ?Take over-the-counter and prescription medicines only as told by your health care provider. ?Take cough medicine only if you have trouble sleeping. Cough medicine can prevent your body from removing mucus from your lungs. ?If you were prescribed an antibiotic medicine, take it as told by your health care provider. Do not stop taking the antibiotic even if you start to feel better. ?Lifestyle ?  ?Do not drink alcohol. ?Do not use any products that contain nicotine or tobacco, such as cigarettes, e-cigarettes, and chewing tobacco. If you   need help quitting, ask your health care  provider. ?Eat a healthy diet. This includes plenty of vegetables, fruits, whole grains, low-fat dairy products, and lean protein. ?General instructions ?Rest a lot and get at least 8 hours of sleep each night. ?Sleep in a partly upright position at night. Place a few pillows under your head or sleep in a reclining chair. ?Return to your normal activities as told by your health care provider. Ask your health care provider what activities are safe for you. ?Drink enough fluid to keep your urine pale yellow. This helps to thin the mucus in your lungs. ?If your throat is sore, gargle with a salt-water mixture 3-4 times a day or as needed. To make a salt-water mixture, completely dissolve ?-1 tsp (3-6 g) of salt in 1 cup (237 mL) of warm water. ?Keep all follow-up visits as told by your health care provider. This is important. ?How is this prevented? ?You can lower your risk of developing community-acquired pneumonia by: ?Getting the pneumonia vaccine. There are different types and schedules of pneumonia vaccines. Ask your health care provider which option is best for you. Consider getting the pneumonia vaccine if: ?You are older than 60 years of age. ?You are 19-65 years of age and are receiving cancer treatment, have chronic lung disease, or have other medical conditions that affect your immune system. Ask your health care provider if this applies to you. ?Getting your influenza vaccine every year. Ask your health care provider which type of vaccine is best for you. ?Getting regular dental checkups. ?Washing your hands often with soap and water for at least 20 seconds. If soap and water are not available, use hand sanitizer. ?Contact a health care provider if you have: ?A fever. ?Trouble sleeping because you cannot control your cough with cough medicine. ?Get help right away if: ?Your shortness of breath becomes worse. ?Your chest pain increases. ?Your sickness becomes worse, especially if you are an older adult or  have a weak immune system. ?You cough up blood. ?These symptoms may represent a serious problem that is an emergency. Do not wait to see if the symptoms will go away. Get medical help right away. Call your local emergency services (911 in the [place] Do not drive yourself to the hospital. ?Summary ?Pneumonia is an infection of the lungs. ?Community-acquired pneumonia develops in people who have not been in the hospital. It can be caused by bacteria, viruses, or fungi. ?This condition may be treated with antibiotics or antiviral medicines. ?Severe pneumonia may require a hospital stay and treatment to help with breathing. ?This information is not intended to replace advice given to you by your health care provider. Make sure you discuss any questions you have with your health care provider. ?Document Revised: 02/01/2019 Document Reviewed: 02/01/2019 ?Elsevier Patient Education ? 2022 Elsevier Inc. ? ?

## 2021-05-30 ENCOUNTER — Encounter: Payer: Self-pay | Admitting: Podiatry

## 2021-05-30 ENCOUNTER — Ambulatory Visit (INDEPENDENT_AMBULATORY_CARE_PROVIDER_SITE_OTHER): Payer: 59 | Admitting: Podiatry

## 2021-05-30 ENCOUNTER — Other Ambulatory Visit: Payer: Self-pay

## 2021-05-30 DIAGNOSIS — M7671 Peroneal tendinitis, right leg: Secondary | ICD-10-CM | POA: Diagnosis not present

## 2021-05-30 MED ORDER — TRIAMCINOLONE ACETONIDE 10 MG/ML IJ SUSP
10.0000 mg | Freq: Once | INTRAMUSCULAR | Status: AC
  Administered 2021-05-30: 10 mg

## 2021-06-02 NOTE — Progress Notes (Signed)
Subjective:   Patient ID: Tammy [name], female   [name]: 61 y.o.   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  Continue supportive shoes stabilizing the area and I went ahead and did sterile prep and did careful injection after explaining chances for rupture 3 mg dexamethasone Kenalog 5 mg Xylocaine the sheath of the peroneal tendon

## 2021-06-21 ENCOUNTER — Ambulatory Visit (INDEPENDENT_AMBULATORY_CARE_PROVIDER_SITE_OTHER): Payer: 59 | Admitting: Family Medicine

## 2021-06-21 ENCOUNTER — Encounter: Payer: Self-pay | Admitting: Family Medicine

## 2021-06-21 VITALS — BP 132/78 | HR 61 | Temp 98.2°F | Resp 20 | Ht 65.0 in | Wt 355.2 lb

## 2021-06-21 DIAGNOSIS — J302 Other seasonal allergic rhinitis: Secondary | ICD-10-CM | POA: Diagnosis not present

## 2021-06-21 DIAGNOSIS — M170 Bilateral primary osteoarthritis of knee: Secondary | ICD-10-CM

## 2021-06-21 DIAGNOSIS — R42 Dizziness and giddiness: Secondary | ICD-10-CM

## 2021-06-21 DIAGNOSIS — R600 Localized edema: Secondary | ICD-10-CM

## 2021-06-21 DIAGNOSIS — D229 Melanocytic nevi, unspecified: Secondary | ICD-10-CM | POA: Diagnosis not present

## 2021-06-21 DIAGNOSIS — I890 Lymphedema, not elsewhere classified: Secondary | ICD-10-CM

## 2021-06-21 MED ORDER — NONFORMULARY OR COMPOUNDED ITEM: 1 refills | Status: DC

## 2021-06-21 MED ORDER — FLUTICASONE PROPIONATE 50 MCG/ACT NA SUSP: 1.0000 | Freq: Two times a day (BID) | NASAL | 6 refills | Status: DC

## 2021-06-21 NOTE — Assessment & Plan Note (Signed)
Refer to derm  ?

## 2021-06-21 NOTE — Progress Notes (Signed)
Subjective:   By signing my name below, I, Tammy [name], attest that this documentation has been prepared under the direction and in the presence of Tammy Held, DO. 06/21/2021      Patient ID: Tammy [name], female    [name]: 1960-07-01, 61 y.o.   MRN: [id number]  Chief Complaint  Patient presents with   Skin Concern    X1 month, Pt states noticing new moles on both legs and arms. Pt states no pain or itching.     HPI Patient is in today for an office visit.  She is complaining of new moles on her legs and arms that first appeared a month ago.They are non-itching. Adds that she had come earlier for just one mole but it has grown bigger.  She reports he has gained weight since her last visit. She had tried physical therapy in the past to hep with posture and very satisfied with the results. She will like to start with them again.                                                                                                                                                                                                                                                                                                                        She would like a new prescription for compression stockings.  Past Medical History:  Diagnosis Date   Acute on chronic appendicitis s/p lap appy 06/10/2012 06/10/2012   surgery done 06-10-12   Arthritis    ankle   Back pain    Chest pain    Complication of anesthesia    Constipation    Diabetes mellitus    TYPE 2- no meds in several months-was taken off meds -monitoring blood sugar.   GERD (gastroesophageal reflux disease)    History of colon polyps    Hyperlipidemia    Hypertension    Joint pain    Lactose intolerance    Morbid obesity - BMI >  50 12/13/2012   Pneumonia    none recent   PONV (postoperative nausea and vomiting)    Poor venous access    "usually difficult stick"   Prediabetes    Sinusitis    SOB (shortness of breath)     Swelling    Vitamin D deficiency     Past Surgical History:  Procedure Laterality Date   ABDOMINAL HYSTERECTOMY     APPENDECTOMY     2'14   COLONOSCOPY  04/11/11   5 mm polyp removed but not recovered   COLONOSCOPY WITH PROPOFOL N/A 05/11/2017   Procedure: COLONOSCOPY WITH PROPOFOL;  Surgeon: Gatha Mayer, MD;  Location: [facility];  Service: Endoscopy;  Laterality: N/A;   GASTRIC ROUX-EN-Y N/A 02/27/2015   Procedure: LAPAROSCOPIC ROUX-EN-Y GASTRIC BYPASS WITH UPPER ENDOSCOPY;  Surgeon: Johnathan Hausen, MD;  Location: [facility];  Service: General;  Laterality: N/A;   KNEE SURGERY Right     KNEE SURGERY    LAPAROSCOPIC APPENDECTOMY  06/10/2012   Procedure: APPENDECTOMY LAPAROSCOPIC;  Surgeon: Adin Hector, MD;  Location: [facility];  Service: General;  Laterality: N/A;   UPPER GASTROINTESTINAL ENDOSCOPY  04/14/2006   Normal    Family History  Problem Relation Age of Onset   Hyperlipidemia Mother    Hypertension Mother    Cancer Mother 75       hodgkins lymphoma   Diabetes Father    Heart failure Father    Heart disease Father    Hyperlipidemia Brother    Diabetes Paternal Grandmother    Breast cancer Paternal Aunt        unsure of age   Breast cancer Paternal Aunt    Heart disease Cousin    Colon cancer Neg Hx    Heart attack Neg Hx    Sudden death Neg Hx    Colon polyps Neg Hx    Esophageal cancer Neg Hx    Stomach cancer Neg Hx    Rectal cancer Neg Hx     Social History   Socioeconomic History   Marital status: Married    Spouse name: Publishing rights manager   Number of children: 0   Years of education: Not on file   Highest education level: Not on file  Occupational History   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[employer]  Tobacco Use   Smoking status: Never   Smokeless tobacco: Never  Vaping Use   Vaping Use: Never used  Substance and Sexual Activity   Alcohol use: No    Alcohol/week: 0.0 standard drinks   Drug use: No   Sexual activity: Not on file   Other Topics Concern   Not on file  Social History Narrative   Exercise- no   Social Determinants of Health   Financial Resource Strain: Not on file  Food Insecurity: Not on file  Transportation Needs: Not on file  Physical Activity: Not on file  Stress: Not on file  Social Connections: Not on file  Intimate Partner Violence: Not on file    Outpatient Medications Prior to Visit  Medication Sig Dispense Refill   amLODipine (NORVASC) 2.5 MG tablet TAKE 1 TABLET BY MOUTH  TWICE DAILY 180 tablet 1   carvedilol (COREG) 6.25 MG tablet TAKE 1 TABLET BY MOUTH  TWICE DAILY WITH MEALS 180 tablet 3   furosemide (LASIX) 40 MG tablet TAKE 1 TABLET BY MOUTH  DAILY 90 tablet 1   glucose blood test strip Check blood sugar twice daily 100 each  11   loratadine (CLARITIN) 10 MG tablet Take 1 tablet (10 mg total) by mouth daily. 30 tablet 11   meloxicam (MOBIC) 15 MG tablet Take 1 tablet daily with food for 14 days. Then take as needed. 30 tablet 1   Multiple Vitamins-Minerals (MULTIVITAMIN ADULT PO) Take 1 tablet by mouth daily. flintstone vitamins     NONFORMULARY OR COMPOUNDED ITEM Thigh high compression socks  20-30 mmhg    Re- low ext edema 1 each 1   pantoprazole (PROTONIX) 40 MG tablet Take 2 tablets (80 mg total) by mouth daily. 180 tablet 3   promethazine-dextromethorphan (PROMETHAZINE-DM) 6.25-15 MG/5ML syrup Take 5 mLs by mouth 4 (four) times daily as needed. 118 mL 0   fluticasone (FLONASE) 50 MCG/ACT nasal spray Place 1 spray into both nostrils in the morning and at bedtime. 1 g 6   No facility-administered medications prior to visit.    Allergies  Allergen Reactions   Ace Inhibitors Anaphylaxis    Bowel angioedema   Cheese Nausea And Vomiting   Red Dye Nausea And Vomiting   Tuna [Fish Allergy] Nausea And Vomiting    Review of Systems  Constitutional:  Negative for fever.  HENT:  Negative for congestion, ear pain, hearing loss, sinus pain and sore throat.   Eyes:  Negative for  blurred vision and pain.  Respiratory:  Negative for cough, sputum production, shortness of breath and wheezing.   Cardiovascular:  Negative for chest pain and palpitations.  Gastrointestinal:  Negative for blood in stool, constipation, diarrhea, nausea and vomiting.  Genitourinary:  Negative for dysuria, frequency, hematuria and urgency.  Musculoskeletal:  Negative for back pain, falls and myalgias.  Skin:        (+) moles on legs and arms  Neurological:  Negative for dizziness, sensory change, loss of consciousness, weakness and headaches.  Endo/Heme/Allergies:  Negative for environmental allergies. Does not bruise/bleed easily.  Psychiatric/Behavioral:  Negative for depression and suicidal ideas. The patient is not nervous/anxious and does not have insomnia.       Objective:    Physical Exam Constitutional:      General: She is not in acute distress.    Appearance: Normal appearance. She is not ill-appearing.  HENT:     Head: Normocephalic and atraumatic.     Right Ear: External ear normal.     Left Ear: External ear normal.  Eyes:     Extraocular Movements: Extraocular movements intact.     Pupils: Pupils are equal, round, and reactive to light.  Cardiovascular:     Rate and Rhythm: Normal rate and regular rhythm.     Pulses: Normal pulses.     Heart sounds: Normal heart sounds. No murmur heard.   No gallop.  Pulmonary:     Effort: Pulmonary effort is normal. No respiratory distress.     Breath sounds: Normal breath sounds. No wheezing, rhonchi or rales.  Abdominal:     General: Bowel sounds are normal. There is no distension.     Palpations: Abdomen is soft. There is no mass.     Tenderness: There is no abdominal tenderness. There is no guarding or rebound.     Hernia: No hernia is present.  Musculoskeletal:     Cervical back: Normal range of motion and neck supple.  Lymphadenopathy:     Cervical: No cervical adenopathy.  Skin:    General: Skin is warm and dry.   Neurological:     Mental Status: She is alert and oriented to  person, place, and time.  Psychiatric:        Behavior: Behavior normal.    BP 132/78 (BP Location: Right Arm, Patient Position: Sitting, Cuff Size: Large)    Pulse 61    Temp 98.2 F (36.8 C) (Oral)    Resp 20    Ht 5\' 5"  (1.651 m)    Wt (!) 355 lb 3.2 oz (161.1 kg)    SpO2 98%    BMI 59.11 kg/m  Wt Readings from Last 3 Encounters:  06/21/21 (!) 355 lb 3.2 oz (161.1 kg)  04/09/21 (!) 354 lb 6.4 oz (160.8 kg)  03/12/21 (!) 346 lb 3.2 oz (157 kg)    Diabetic Foot Exam - Simple   No data filed    Lab Results  Component Value Date   WBC 3.2 (L) 04/09/2021   HGB 12.1 04/09/2021   HCT 37.4 04/09/2021   PLT 255.0 04/09/2021   GLUCOSE 95 04/09/2021   CHOL 187 02/15/2021   TRIG 56.0 02/15/2021   HDL 71.90 02/15/2021   LDLDIRECT 129.1 10/17/2009   LDLCALC 104 (H) 02/15/2021   ALT 21 04/09/2021   AST 21 04/09/2021   NA 142 04/09/2021   K 3.9 04/09/2021   CL 106 04/09/2021   CREATININE 0.89 04/09/2021   BUN 14 04/09/2021   CO2 29 04/09/2021   TSH 2.26 02/15/2021   INR 1.1 (H) 10/16/2014   HGBA1C 6.1 02/15/2021   MICROALBUR <0.7 02/15/2021    Lab Results  Component Value Date   TSH 2.26 02/15/2021   Lab Results  Component Value Date   WBC 3.2 (L) 04/09/2021   HGB 12.1 04/09/2021   HCT 37.4 04/09/2021   MCV 88.3 04/09/2021   PLT 255.0 04/09/2021   Lab Results  Component Value Date   NA 142 04/09/2021   K 3.9 04/09/2021   CO2 29 04/09/2021   GLUCOSE 95 04/09/2021   BUN 14 04/09/2021   CREATININE 0.89 04/09/2021   BILITOT 0.6 04/09/2021   ALKPHOS 100 04/09/2021   AST 21 04/09/2021   ALT 21 04/09/2021   PROT 6.7 04/09/2021   ALBUMIN 3.8 04/09/2021   CALCIUM 8.6 04/09/2021   ANIONGAP 5 04/22/2018   GFR 70.59 04/09/2021   Lab Results  Component Value Date   CHOL 187 02/15/2021   Lab Results  Component Value Date   HDL 71.90 02/15/2021   Lab Results  Component Value Date   LDLCALC 104 (H)  02/15/2021   Lab Results  Component Value Date   TRIG 56.0 02/15/2021   Lab Results  Component Value Date   CHOLHDL 3 02/15/2021   Lab Results  Component Value Date   HGBA1C 6.1 02/15/2021       Assessment & Plan:   Problem List Items Addressed This Visit       Unprioritized   Lower extremity edema   Relevant Medications   NONFORMULARY OR COMPOUNDED ITEM   Lymphedema    con't wraps Pt       Multiple atypical skin moles - Primary    Refer to derm       Relevant Orders   Ambulatory referral to Dermatology   Vertigo   Other Visit Diagnoses     Seasonal allergies       Relevant Medications   fluticasone (FLONASE) 50 MCG/ACT nasal spray   Primary osteoarthritis of both knees       Relevant Orders   Ambulatory referral to Physical Therapy       Meds ordered  this encounter  Medications   fluticasone (FLONASE) 50 MCG/ACT nasal spray    Sig: Place 1 spray into both nostrils in the morning and at bedtime.    Dispense:  1 g    Refill:  6   NONFORMULARY OR COMPOUNDED ITEM    Sig: Compression socks 20-30mm/hg  #1  as directed    Dispense:  1 each    Refill:  1    I,Tammy [name],acting as a scribe for [employer], DO.,have documented all relevant documentation on the behalf of Tammy Held, DO,as directed by  Tammy Held, DO while in the presence of Tammy Held, DO.   I, Tammy Held, DO. , personally preformed the services described in this documentation.  All medical record entries made by the scribe were at my direction and in my presence.  I have reviewed the chart and discharge instructions (if applicable) and agree that the record reflects my personal performance and is accurate and complete. 06/21/2021

## 2021-06-21 NOTE — Assessment & Plan Note (Signed)
con't wraps Pt

## 2021-06-21 NOTE — Patient Instructions (Signed)
Mole A mole is a colored (pigmented) growth on the skin. Moles are very common. They are usually harmless, but some moles can become cancerous over time. What are the causes? Moles are caused when pigmented skin cells grow together in clusters instead of spreading out in the skin as they normally do. The reason why the skin cells grow together in clusters is not known. What increases the risk? You are more likely to develop a mole if you: Have family members who have moles. Are white. Have blond hair. Are often outdoors and exposed to the sun. Received phototherapy when you were a newborn baby. Are female. What are the signs or symptoms? A mole may be: [place] or black. Flat or raised. Smooth or wrinkled. How is this diagnosed? A mole is diagnosed with a skin exam. If your health care provider thinks a mole may be cancerous, all or part of the mole will be removed for testing (biopsy). How is this treated? Most moles are noncancerous (benign) and do not require treatment. If a mole is found to be cancerous, it will be removed. You may also choose to have a mole removed if it is causing pain or if you do not like the way it looks. Follow these instructions at home: General instructions  Every month, look for new moles and check your existing moles for changes. This is important because a change in a mole can mean that the mole has become cancerous. ABCDE changes in a mole indicate that you should be evaluated by your health care provider. ABCDE stands for: Asymmetry. This means the mole has an irregular shape. It is not round or oval. Border. This means the mole has an irregular or bumpy border. Color. This means the mole has multiple colors in it, including brown, black, blue, red, or tan. Note that it is normal for moles to get darker when a woman is pregnant or takes birth control pills. Diameter. This means the mole is more than 0.2 inches (6 mm) across. Evolving. This refers to any  unusual changes or symptoms in the mole, such as pain, itching, stinging, sensitivity, or bleeding. If you have a large number of moles, see a skin doctor (dermatologist) at least one time every year for a full-body skin check. Lifestyle  When you are outdoors, wear sunscreen with SPF 30 (sun protection factor 30) or higher. Use an adequate amount of sunscreen to cover exposed areas of skin. Put it on 30 minutes before you go out. Reapply it every 2 hours or anytime you come out of the water. When you are out in the sun, wear a broad-brimmed hat and clothing that covers your arms and legs. Wear wraparound sunglasses. Contact a health care provider if: The size, shape, borders, or color of your mole changes. Your mole, or the skin near the mole, becomes painful, sore, red, or swollen. Your mole: Develops more than one color. Itches or bleeds. Becomes scaly, sheds skin, or oozes fluid. Becomes flat or develops raised areas. Becomes hard or soft. You develop a new mole. Summary A mole is a colored (pigmented) growth on the skin. Moles are very common. They are usually harmless, but some moles can become cancerous over time. Every month, look for new moles and check your existing moles for changes. This is important because a change in a mole can mean that the mole has become cancerous. If you have a large number of moles, see a skin doctor (dermatologist) at least one time  every year for a full-body skin check. When you are outdoors, wear sunscreen with SPF 30 (sun protection factor 30) or higher. Reapply it every 2 hours or anytime you come out of the water. Contact a health care provider if you notice changes in a mole or if you develop a new mole. This information is not intended to replace advice given to you by your health care provider. Make sure you discuss any questions you have with your health care provider. Document Revised: 11/25/2018 Document Reviewed: 09/15/2017 Elsevier Patient  Education  [place].

## 2021-07-01 ENCOUNTER — Other Ambulatory Visit: Payer: Self-pay | Admitting: Family Medicine

## 2021-07-01 ENCOUNTER — Encounter: Payer: Self-pay | Admitting: Family Medicine

## 2021-07-01 DIAGNOSIS — Z1231 Encounter for screening mammogram for malignant neoplasm of breast: Secondary | ICD-10-CM

## 2021-07-05 ENCOUNTER — Encounter: Payer: Self-pay | Admitting: Family Medicine

## 2021-07-10 LAB — HM DIABETES EYE EXAM

## 2021-08-07 ENCOUNTER — Encounter: Payer: Self-pay | Admitting: Family Medicine

## 2021-08-07 ENCOUNTER — Ambulatory Visit (INDEPENDENT_AMBULATORY_CARE_PROVIDER_SITE_OTHER): Payer: 59 | Admitting: Family Medicine

## 2021-08-07 VITALS — BP 194/93 | Ht 65.0 in | Wt 355.0 lb

## 2021-08-07 DIAGNOSIS — M25562 Pain in left knee: Secondary | ICD-10-CM

## 2021-08-07 NOTE — Progress Notes (Signed)
PCP: [name], DO ? ?Subjective:  ? ?HPI: ?Patient is a 61 y.o. female here for left knee pain. ? ?Patient has prior history of left knee osteoarthritis. ?She states that she was doing well up until about 2 weeks ago. ?She was moving boxes and items and twisted to her left and felt and heard a pop within her left knee. ?Had difficulty bearing weight initially but able to do so. ?This has been slowly improving over that time and pain is now a 4 or 5 out of 10. ?Pain is worse when she is on her feet for long periods of time. ?She has had some giving out of this knee but this is not new. ?She has taken some Advil with mild relief. ? ?Past Medical History:  ?Diagnosis Date  ? Acute on chronic appendicitis s/p lap appy 06/10/2012 06/10/2012  ? surgery done 06-10-12  ? Arthritis   ? ankle  ? Back pain   ? Chest pain   ? Complication of anesthesia   ? Constipation   ? Diabetes mellitus   ? TYPE 2- no meds in several months-was taken off meds -monitoring blood sugar.  ? GERD (gastroesophageal reflux disease)   ? History of colon polyps   ? Hyperlipidemia   ? Hypertension   ? Joint pain   ? Lactose intolerance   ? Morbid obesity - BMI > 50 12/13/2012  ? Pneumonia   ? none recent  ? PONV (postoperative nausea and vomiting)   ? Poor venous access   ? "usually difficult stick"  ? Prediabetes   ? Sinusitis   ? SOB (shortness of breath)   ? Swelling   ? Vitamin D deficiency   ? ? ?Current Outpatient Medications on File Prior to Visit  ?Medication Sig Dispense Refill  ? amLODipine (NORVASC) 2.5 MG tablet TAKE 1 TABLET BY MOUTH  TWICE DAILY 180 tablet 1  ? carvedilol (COREG) 6.25 MG tablet TAKE 1 TABLET BY MOUTH  TWICE DAILY WITH MEALS 180 tablet 3  ? fluticasone (FLONASE) 50 MCG/ACT nasal spray Place 1 spray into both nostrils in the morning and at bedtime. 1 g 6  ? furosemide (LASIX) 40 MG tablet TAKE 1 TABLET BY MOUTH  DAILY 90 tablet 1  ? glucose blood test strip Check blood sugar twice daily 100 each 11  ? loratadine  (CLARITIN) 10 MG tablet Take 1 tablet (10 mg total) by mouth daily. 30 tablet 11  ? meloxicam (MOBIC) 15 MG tablet Take 1 tablet daily with food for 14 days. Then take as needed. 30 tablet 1  ? Multiple Vitamins-Minerals (MULTIVITAMIN ADULT PO) Take 1 tablet by mouth daily. flintstone vitamins    ? NONFORMULARY OR COMPOUNDED ITEM Thigh high compression socks  20-30 mmhg    Re- low ext edema 1 each 1  ? NONFORMULARY OR COMPOUNDED ITEM Compression socks 20-28m/hg  #1  as directed 1 each 1  ? pantoprazole (PROTONIX) 40 MG tablet Take 2 tablets (80 mg total) by mouth daily. 180 tablet 3  ? promethazine-dextromethorphan (PROMETHAZINE-DM) 6.25-15 MG/5ML syrup Take 5 mLs by mouth 4 (four) times daily as needed. 118 mL 0  ? ?No current facility-administered medications on file prior to visit.  ? ? ?Past Surgical History:  ?Procedure Laterality Date  ? ABDOMINAL HYSTERECTOMY    ? APPENDECTOMY    ? 2'14  ? COLONOSCOPY  04/11/11  ? 5 mm polyp removed but not recovered  ? COLONOSCOPY WITH PROPOFOL N/A 05/11/2017  ? Procedure:  COLONOSCOPY WITH PROPOFOL;  Surgeon: Gatha Mayer, MD;  Location: [facility];  Service: Endoscopy;  Laterality: N/A;  ? GASTRIC ROUX-EN-Y N/A 02/27/2015  ? Procedure: LAPAROSCOPIC ROUX-EN-Y GASTRIC BYPASS WITH UPPER ENDOSCOPY;  Surgeon: Johnathan Hausen, MD;  Location: [facility];  Service: General;  Laterality: N/A;  ? KNEE SURGERY Right   ?  KNEE SURGERY   ? LAPAROSCOPIC APPENDECTOMY  06/10/2012  ? Procedure: APPENDECTOMY LAPAROSCOPIC;  Surgeon: Adin Hector, MD;  Location: [facility];  Service: General;  Laterality: N/A;  ? UPPER GASTROINTESTINAL ENDOSCOPY  04/14/2006  ? Normal  ? ? ?Allergies  ?Allergen Reactions  ? Ace Inhibitors Anaphylaxis  ?  Bowel angioedema  ? Cheese Nausea And Vomiting  ? Red Dye Nausea And Vomiting  ? [name] Flash [Fish Allergy] Nausea And Vomiting  ? ? ?BP (!) 194/93   Ht '5\' 5"'$  (1.651 m)   Wt (!) 355 lb (161 kg)   BMI 59.08 kg/m?  ? ?   ? View : No data to display.  ?  ?  ?  ? ? ?   ?  View : No data to display.  ?  ?  ?  ? ? ?    ?Objective:  ?Physical Exam: ? ?Gen: NAD, comfortable in exam room ? ?Left knee: ?No gross deformity, ecchymoses.  Difficult to assess swelling. ?TTP lateral joint line. ?FROM with normal strength. ?Negative ant/post drawers. Negative valgus/varus testing. Negative lachman, lever. ?Mild pain with mcmurrays, apleys.  Unable to perform thessalys - concerned knee will buckle on her ?Negative patellar apprehension. ?NV intact distally. ?  ?Assessment & Plan:  ?1. Left knee injury -consistent with at least lateral meniscus contusion with possible small tear.  Advised these typically improve without surgery.  To try Aleve twice a day with food though that can take Tylenol in addition to this if needed.  Icing, home exercises reviewed for quad strengthening.  Follow-up in 4 weeks if not improving as expected. ?

## 2021-08-07 NOTE — Patient Instructions (Signed)
You pinched your lateral meniscus though it's possible you have a small tear. ?These typically resolve without surgery. ?Aleve 2 tabs up to twice a day with food for pain and inflammation - don't take motrin/ibuprofen with this. ?Ok to take tylenol in addition to this though if needed. ?Ice 15 minutes at a time 3-4 times a day. ?Follow up with me in 4 weeks if not improving as expected. ?

## 2021-08-08 ENCOUNTER — Ambulatory Visit (INDEPENDENT_AMBULATORY_CARE_PROVIDER_SITE_OTHER): Payer: 59 | Admitting: Family Medicine

## 2021-08-08 ENCOUNTER — Encounter: Payer: Self-pay | Admitting: Family Medicine

## 2021-08-08 VITALS — BP 138/82 | HR 54 | Resp 20 | Ht 65.0 in | Wt 353.0 lb

## 2021-08-08 DIAGNOSIS — R600 Localized edema: Secondary | ICD-10-CM | POA: Diagnosis not present

## 2021-08-08 DIAGNOSIS — N644 Mastodynia: Secondary | ICD-10-CM

## 2021-08-08 NOTE — Progress Notes (Signed)
? ?Subjective:  ? ?By signing my name below, I, [name], attest that this documentation has been prepared under the direction and in the presence of [name], DO. 08/08/2021 ? ? ? Patient ID: Tammy [name], female    [name]: 26-May-1960, 61 y.o.   MRN: [id number] ? ?Chief Complaint  ?Patient presents with  ? Leg Swelling  ?  Bilateral, R leg worse ?About 2 weeks ?Painful if on feet for long period of time  ? ? ?HPI ?Patient is in today for a office visit.  ? ?She complains of leg swelling in both legs. Her right leg is worse than her left leg. She finds her legs get tighter and hard when her swelling worsens. She received new compression socks but found they don't feel like they hold her legs tight. She continues using her previous compression socks at this time. She continues seeing physical therapy to manage her leg swelling and found her swelling has improved. She also finds her balance and walking has improved since starting physical therapy. She continues taking 40 mg lasix daily PO and reports no new issues while taking it.  ?She complains of sore pain on her left breast. She is requesting to have a diagnostic breast exam for further evaluation.  ? ? ?Past Medical History:  ?Diagnosis Date  ? Acute on chronic appendicitis s/p lap appy 06/10/2012 06/10/2012  ? surgery done 06-10-12  ? Arthritis   ? ankle  ? Back pain   ? Chest pain   ? Complication of anesthesia   ? Constipation   ? Diabetes mellitus   ? TYPE 2- no meds in several months-was taken off meds -monitoring blood sugar.  ? GERD (gastroesophageal reflux disease)   ? History of colon polyps   ? Hyperlipidemia   ? Hypertension   ? Joint pain   ? Lactose intolerance   ? Morbid obesity - BMI > 50 12/13/2012  ? Pneumonia   ? none recent  ? PONV (postoperative nausea and vomiting)   ? Poor venous access   ? "usually difficult stick"  ? Prediabetes   ? Sinusitis   ? SOB (shortness of breath)   ? Swelling   ? Vitamin D deficiency   ? ? ?Past  Surgical History:  ?Procedure Laterality Date  ? ABDOMINAL HYSTERECTOMY    ? APPENDECTOMY    ? 2'14  ? COLONOSCOPY  04/11/11  ? 5 mm polyp removed but not recovered  ? COLONOSCOPY WITH PROPOFOL N/A 05/11/2017  ? Procedure: COLONOSCOPY WITH PROPOFOL;  Surgeon: Gatha Mayer, MD;  Location: [facility];  Service: Endoscopy;  Laterality: N/A;  ? GASTRIC ROUX-EN-Y N/A 02/27/2015  ? Procedure: LAPAROSCOPIC ROUX-EN-Y GASTRIC BYPASS WITH UPPER ENDOSCOPY;  Surgeon: Johnathan Hausen, MD;  Location: [facility];  Service: General;  Laterality: N/A;  ? KNEE SURGERY Right   ?  KNEE SURGERY   ? LAPAROSCOPIC APPENDECTOMY  06/10/2012  ? Procedure: APPENDECTOMY LAPAROSCOPIC;  Surgeon: Adin Hector, MD;  Location: [facility];  Service: General;  Laterality: N/A;  ? UPPER GASTROINTESTINAL ENDOSCOPY  04/14/2006  ? Normal  ? ? ?Family History  ?Problem Relation Age of Onset  ? Hyperlipidemia Mother   ? Hypertension Mother   ? Cancer Mother 22  ?     hodgkins lymphoma  ? Diabetes Father   ? Heart failure Father   ? Heart disease Father   ? Hyperlipidemia Brother   ? Diabetes Paternal Grandmother   ? Breast cancer Paternal Aunt   ?  unsure of age  ? Breast cancer Paternal Aunt   ? Heart disease Cousin   ? Colon cancer Neg Hx   ? Heart attack Neg Hx   ? Sudden death Neg Hx   ? Colon polyps Neg Hx   ? Esophageal cancer Neg Hx   ? Stomach cancer Neg Hx   ? Rectal cancer Neg Hx   ? ? ?Social History  ? ?Socioeconomic History  ? Marital status: Married  ?  Spouse name: Dorothyann Peng  ? Number of children: 0  ? Years of education: Not on file  ? Highest education level: Not on file  ?Occupational History  ? 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  ?  Employer: [employer]  ?Tobacco Use  ? Smoking status: Never  ? Smokeless tobacco: Never  ?Vaping Use  ? Vaping Use: Never used  ?Substance and Sexual Activity  ? Alcohol use: No  ?  Alcohol/week: 0.0 standard drinks  ? Drug use: No  ? Sexual activity: Not on file  ?Other Topics Concern  ? Not on file  ?Social  History Narrative  ? Exercise- no  ? ?Social Determinants of Health  ? ?Financial Resource Strain: Not on file  ?Food Insecurity: Not on file  ?Transportation Needs: Not on file  ?Physical Activity: Not on file  ?Stress: Not on file  ?Social Connections: Not on file  ?Intimate Partner Violence: Not on file  ? ? ?Outpatient Medications Prior to Visit  ?Medication Sig Dispense Refill  ? amLODipine (NORVASC) 2.5 MG tablet TAKE 1 TABLET BY MOUTH  TWICE DAILY 180 tablet 1  ? carvedilol (COREG) 6.25 MG tablet TAKE 1 TABLET BY MOUTH  TWICE DAILY WITH MEALS 180 tablet 3  ? fluticasone (FLONASE) 50 MCG/ACT nasal spray Place 1 spray into both nostrils in the morning and at bedtime. 1 g 6  ? furosemide (LASIX) 40 MG tablet TAKE 1 TABLET BY MOUTH  DAILY 90 tablet 1  ? glucose blood test strip Check blood sugar twice daily 100 each 11  ? loratadine (CLARITIN) 10 MG tablet Take 1 tablet (10 mg total) by mouth daily. 30 tablet 11  ? meloxicam (MOBIC) 15 MG tablet Take 1 tablet daily with food for 14 days. Then take as needed. 30 tablet 1  ? Multiple Vitamins-Minerals (MULTIVITAMIN ADULT PO) Take 1 tablet by mouth daily. flintstone vitamins    ? NONFORMULARY OR COMPOUNDED ITEM Thigh high compression socks  20-30 mmhg    Re- low ext edema 1 each 1  ? NONFORMULARY OR COMPOUNDED ITEM Compression socks 20-51m/hg  #1  as directed 1 each 1  ? pantoprazole (PROTONIX) 40 MG tablet Take 2 tablets (80 mg total) by mouth daily. 180 tablet 3  ? promethazine-dextromethorphan (PROMETHAZINE-DM) 6.25-15 MG/5ML syrup Take 5 mLs by mouth 4 (four) times daily as needed. 118 mL 0  ? ?No facility-administered medications prior to visit.  ? ? ?Allergies  ?Allergen Reactions  ? Ace Inhibitors Anaphylaxis  ?  Bowel angioedema  ? Cheese Nausea And Vomiting  ? Red Dye Nausea And Vomiting  ? TGeralyn Flash[Fish Allergy] Nausea And Vomiting  ? ? ?Review of Systems  ?Constitutional:  Negative for fever and malaise/fatigue.  ?HENT:  Negative for congestion.   ?Eyes:   Negative for blurred vision.  ?Respiratory:  Negative for shortness of breath.   ?Cardiovascular:  Positive for leg swelling (Bilateral legs. (R>L)). Negative for chest pain and palpitations.  ?Gastrointestinal:  Negative for abdominal pain, blood in stool and nausea.  ?Genitourinary:  Negative for dysuria and frequency.  ?Musculoskeletal:  Negative for falls.  ?Skin:  Negative for rash.  ?Neurological:  Negative for dizziness, loss of consciousness and headaches.  ?Endo/Heme/Allergies:  Negative for environmental allergies.  ?Psychiatric/Behavioral:  Negative for depression. The patient is not nervous/anxious.   ? ?   ?Objective:  ?  ?Physical Exam ?Vitals and nursing note reviewed.  ?Constitutional:   ?   General: She is not in acute distress. ?   Appearance: Normal appearance. She is well-developed. She is not ill-appearing.  ?HENT:  ?   Head: Normocephalic and atraumatic.  ?   Right Ear: External ear normal.  ?   Left Ear: External ear normal.  ?Eyes:  ?   Extraocular Movements: Extraocular movements intact.  ?   Conjunctiva/sclera: Conjunctivae normal.  ?   Pupils: Pupils are equal, round, and reactive to light.  ?Neck:  ?   Thyroid: No thyromegaly.  ?   Vascular: No carotid bruit or JVD.  ?Cardiovascular:  ?   Rate and Rhythm: Normal rate and regular rhythm.  ?   Heart sounds: Normal heart sounds. No murmur heard. ?  No gallop.  ?Pulmonary:  ?   Effort: Pulmonary effort is normal. No respiratory distress.  ?   Breath sounds: Normal breath sounds. No wheezing or rales.  ?Chest:  ?   Chest wall: No tenderness.  ?Musculoskeletal:  ?   Cervical back: Normal range of motion and neck supple.  ?Skin: ?   General: Skin is warm and dry.  ?Neurological:  ?   Mental Status: She is alert and oriented to person, place, and time.  ?Psychiatric:     ?   Judgment: Judgment normal.  ? ? ?BP 138/82 (BP Location: Left Arm, Patient Position: Sitting, Cuff Size: Large)   Pulse (!) 54   Resp 20   Ht '5\' 5"'$  (1.651 m)   Wt (!) 353  lb (160.1 kg)   SpO2 99%   BMI 58.74 kg/m?  ?Wt Readings from Last 3 Encounters:  ?08/08/21 (!) 353 lb (160.1 kg)  ?08/07/21 (!) 355 lb (161 kg)  ?06/21/21 (!) 355 lb 3.2 oz (161.1 kg)  ? ? ?Diabetic Foot Exam - S

## 2021-08-08 NOTE — Patient Instructions (Signed)
Edema °Edema is an abnormal buildup of fluids in the body tissues and under the skin. Swelling of the legs, feet, and ankles is a common symptom that becomes more likely as you get older. Swelling is also common in looser tissues, like around the eyes. When the affected area is squeezed, the fluid may move out of that spot and leave a dent for a few moments. This dent is called pitting edema. °There are many possible causes of edema. Eating too much salt (sodium) and being on your feet or sitting for a long time can cause edema in your legs, feet, and ankles. Hot weather may make edema worse. Common causes of edema include: °Heart failure. °Liver or kidney disease. °Weak leg blood vessels. °Cancer. °An injury. °Pregnancy. °Medicines. °Being obese. °Low protein levels in the blood. °Edema is usually painless. Your skin may look swollen or shiny. °Follow these instructions at home: °Keep the affected body part raised (elevated) above the level of your heart when you are sitting or lying down. °Do not sit still or stand for long periods of time. °Do not wear tight clothing. Do not wear garters on your upper legs. °Exercise your legs to get your circulation going. This helps to move the fluid back into your blood vessels, and it may help the swelling go down. °Wear elastic bandages or support stockings to reduce swelling as told by your health care provider. °Eat a low-salt (low-sodium) diet to reduce fluid as told by your health care provider. °Depending on the cause of your swelling, you may need to limit how much fluid you drink (fluid restriction). °Take over-the-counter and prescription medicines only as told by your health care provider. °Contact a health care provider if: °Your edema does not get better with treatment. °You have heart, liver, or kidney disease and have symptoms of edema. °You have sudden and unexplained weight gain. °Get help right away if: °You develop shortness of breath or chest pain. °You  cannot breathe when you lie down. °You develop pain, redness, or warmth in the swollen areas. °You have heart, liver, or kidney disease and suddenly get edema. °You have a fever and your symptoms suddenly get worse. °Summary °Edema is an abnormal buildup of fluids in the body tissues and under the skin. °Eating too much salt (sodium) and being on your feet or sitting for a long time can cause edema in your legs, feet, and ankles. °Keep the affected body part raised (elevated) above the level of your heart when you are sitting or lying down. °This information is not intended to replace advice given to you by your health care provider. Make sure you discuss any questions you have with your health care provider. °Document Revised: 09/20/2020 Document Reviewed: 02/14/2020 °Elsevier Patient Education © 2022 [employer] ° °

## 2021-08-08 NOTE — Assessment & Plan Note (Signed)
Inc lasix to 2 a day for 3-4 days  ?Compression socks  ?Elevate legs  ?Pt for lymphedema  ?

## 2021-08-23 ENCOUNTER — Other Ambulatory Visit: Payer: Self-pay | Admitting: Family Medicine

## 2021-08-23 DIAGNOSIS — N644 Mastodynia: Secondary | ICD-10-CM

## 2021-09-12 ENCOUNTER — Other Ambulatory Visit: Payer: 59

## 2021-09-18 ENCOUNTER — Ambulatory Visit: Payer: 59

## 2021-09-18 ENCOUNTER — Ambulatory Visit
Admission: RE | Admit: 2021-09-18 | Discharge: 2021-09-18 | Disposition: A | Discharge status: Home / Self Care | Payer: 59 | Source: Ambulatory Visit | Attending: Family Medicine | Admitting: Family Medicine

## 2021-09-18 DIAGNOSIS — N644 Mastodynia: Secondary | ICD-10-CM

## 2021-09-26 ENCOUNTER — Encounter: Payer: Self-pay | Admitting: Family Medicine

## 2021-09-26 ENCOUNTER — Ambulatory Visit (INDEPENDENT_AMBULATORY_CARE_PROVIDER_SITE_OTHER): Payer: 59 | Admitting: Family Medicine

## 2021-09-26 VITALS — BP 130/82 | HR 67 | Temp 97.9°F | Resp 20 | Ht 65.0 in | Wt 356.6 lb

## 2021-09-26 DIAGNOSIS — J40 Bronchitis, not specified as acute or chronic: Secondary | ICD-10-CM | POA: Diagnosis not present

## 2021-09-26 DIAGNOSIS — R051 Acute cough: Secondary | ICD-10-CM

## 2021-09-26 LAB — POC COVID19 BINAXNOW: SARS Coronavirus 2 Ag: NEGATIVE

## 2021-09-26 MED ORDER — AMOXICILLIN-POT CLAVULANATE 875-125 MG PO TABS: 1.0000 | ORAL_TABLET | Freq: Two times a day (BID) | ORAL | 0 refills | Status: DC

## 2021-09-26 MED ORDER — PREDNISONE 10 MG PO TABS: ORAL_TABLET | ORAL | 0 refills | Status: DC

## 2021-09-26 NOTE — Progress Notes (Addendum)
Subjective:   By signing my name below, I, Tammy [name], attest that this documentation has been prepared under the direction and in the presence of Dr. Roma Schanz, DO. 09/26/2021    Patient ID: Tammy [name], female    [name]: 1960/09/17, 61 y.o.   MRN: [id number]  Chief Complaint  Patient presents with   Sore Throat    X2 days, sore throat, doesn't hurt to swallow, SOB, cough, runny nose. No COVID test.     Sore Throat  Associated symptoms include coughing and shortness of breath.  Patient is in today for a office visit.   She complains of sore throat, rhinorrhea, chills, shortness of breath, chest tightness and cough since Sunday, 09/22/2021. She has not tested for Covid-19 at home. She reports her sore throat has improved since her symptoms started. She is taking motrin and Flonase to manage her symptoms. She reports her husband had similar symptoms he week prior and she was nursing him and sleeping in the same room as him. She thinks she may have contracted his illness.  She reports having graft removed from the top of her foot and had it biopsied. She reports the results were negative. She continues having pain from the removal site and finds wearing shoes worsen her symptoms.    Past Medical History:  Diagnosis Date   Acute on chronic appendicitis s/p lap appy 06/10/2012 06/10/2012   surgery done 06-10-12   Arthritis    ankle   Back pain    Chest pain    Complication of anesthesia    Constipation    Diabetes mellitus    TYPE 2- no meds in several months-was taken off meds -monitoring blood sugar.   GERD (gastroesophageal reflux disease)    History of colon polyps    Hyperlipidemia    Hypertension    Joint pain    Lactose intolerance    Morbid obesity - BMI > 50 12/13/2012   Pneumonia    none recent   PONV (postoperative nausea and vomiting)    Poor venous access    "usually difficult stick"   Prediabetes    Sinusitis    SOB (shortness of breath)     Swelling    Vitamin D deficiency     Past Surgical History:  Procedure Laterality Date   ABDOMINAL HYSTERECTOMY     APPENDECTOMY     2'14   COLONOSCOPY  04/11/11   5 mm polyp removed but not recovered   COLONOSCOPY WITH PROPOFOL N/A 05/11/2017   Procedure: COLONOSCOPY WITH PROPOFOL;  Surgeon: Gatha Mayer, MD;  Location: [facility];  Service: Endoscopy;  Laterality: N/A;   GASTRIC ROUX-EN-Y N/A 02/27/2015   Procedure: LAPAROSCOPIC ROUX-EN-Y GASTRIC BYPASS WITH UPPER ENDOSCOPY;  Surgeon: Johnathan Hausen, MD;  Location: [facility];  Service: General;  Laterality: N/A;   KNEE SURGERY Right     KNEE SURGERY    LAPAROSCOPIC APPENDECTOMY  06/10/2012   Procedure: APPENDECTOMY LAPAROSCOPIC;  Surgeon: Adin Hector, MD;  Location: [facility];  Service: General;  Laterality: N/A;   UPPER GASTROINTESTINAL ENDOSCOPY  04/14/2006   Normal    Family History  Problem Relation Age of Onset   Hyperlipidemia Mother    Hypertension Mother    Cancer Mother 74       hodgkins lymphoma   Diabetes Father    Heart failure Father    Heart disease Father    Hyperlipidemia Brother    Diabetes Paternal Grandmother    Breast cancer  Paternal Aunt        unsure of age   Breast cancer Paternal Aunt    Heart disease Cousin    Colon cancer Neg Hx    Heart attack Neg Hx    Sudden death Neg Hx    Colon polyps Neg Hx    Esophageal cancer Neg Hx    Stomach cancer Neg Hx    Rectal cancer Neg Hx     Social History   Socioeconomic History   Marital status: Married    Spouse name: Publishing rights manager   Number of children: 0   Years of education: Not on file   Highest education level: Not on file  Occupational History   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[employer]  Tobacco Use   Smoking status: Never   Smokeless tobacco: Never  Vaping Use   Vaping Use: Never used  Substance and Sexual Activity   Alcohol use: No    Alcohol/week: 0.0 standard drinks   Drug use: No   Sexual activity: Not on file   Other Topics Concern   Not on file  Social History Narrative   Exercise- no   Social Determinants of Health   Financial Resource Strain: Not on file  Food Insecurity: Not on file  Transportation Needs: Not on file  Physical Activity: Not on file  Stress: Not on file  Social Connections: Not on file  Intimate Partner Violence: Not on file    Outpatient Medications Prior to Visit  Medication Sig Dispense Refill   amLODipine (NORVASC) 2.5 MG tablet TAKE 1 TABLET BY MOUTH  TWICE DAILY 180 tablet 1   carvedilol (COREG) 6.25 MG tablet TAKE 1 TABLET BY MOUTH  TWICE DAILY WITH MEALS 180 tablet 3   fluticasone (FLONASE) 50 MCG/ACT nasal spray Place 1 spray into both nostrils in the morning and at bedtime. 1 g 6   furosemide (LASIX) 40 MG tablet TAKE 1 TABLET BY MOUTH  DAILY 90 tablet 1   glucose blood test strip Check blood sugar twice daily 100 each 11   loratadine (CLARITIN) 10 MG tablet Take 1 tablet (10 mg total) by mouth daily. 30 tablet 11   meloxicam (MOBIC) 15 MG tablet Take 1 tablet daily with food for 14 days. Then take as needed. 30 tablet 1   Multiple Vitamins-Minerals (MULTIVITAMIN ADULT PO) Take 1 tablet by mouth daily. flintstone vitamins     NONFORMULARY OR COMPOUNDED ITEM Thigh high compression socks  20-30 mmhg    Re- low ext edema 1 each 1   NONFORMULARY OR COMPOUNDED ITEM Compression socks 20-31m/hg  #1  as directed 1 each 1   pantoprazole (PROTONIX) 40 MG tablet Take 2 tablets (80 mg total) by mouth daily. 180 tablet 3   No facility-administered medications prior to visit.    Allergies  Allergen Reactions   Ace Inhibitors Anaphylaxis    Bowel angioedema   Cheese Nausea And Vomiting   Red Dye Nausea And Vomiting   Tuna [Fish Allergy] Nausea And Vomiting    Review of Systems  Constitutional:  Positive for chills.  HENT:  Positive for sore throat.        (+)rhinorrhea  Respiratory:  Positive for cough and shortness of breath.        (+)chest tightness       Objective:    Physical Exam Constitutional:      General: She is not in acute distress.    Appearance: Normal appearance. She is not  ill-appearing.  HENT:     Head: Normocephalic and atraumatic.     Right Ear: Tympanic membrane, ear canal and external ear normal.     Left Ear: Tympanic membrane, ear canal and external ear normal.  Eyes:     Extraocular Movements: Extraocular movements intact.     Pupils: Pupils are equal, round, and reactive to light.  Cardiovascular:     Rate and Rhythm: Normal rate and regular rhythm.     Heart sounds: Normal heart sounds. No murmur heard.   No gallop.  Pulmonary:     Effort: Pulmonary effort is normal. No respiratory distress.     Breath sounds: Decreased air movement present. Decreased breath sounds present. No wheezing or rales.  Lymphadenopathy:     Cervical: No cervical adenopathy.  Skin:    General: Skin is warm and dry.  Neurological:     Mental Status: She is alert and oriented to person, place, and time.  Psychiatric:        Judgment: Judgment normal.    BP 130/82 (BP Location: Right Arm, Patient Position: Sitting, Cuff Size: Large)   Pulse 67   Temp 97.9 F (36.6 C) (Oral)   Resp 20   Ht '5\' 5"'$  (1.651 m)   Wt (!) 356 lb 9.6 oz (161.8 kg)   SpO2 98%   BMI 59.34 kg/m  Wt Readings from Last 3 Encounters:  09/26/21 (!) 356 lb 9.6 oz (161.8 kg)  08/08/21 (!) 353 lb (160.1 kg)  08/07/21 (!) 355 lb (161 kg)    Diabetic Foot Exam - Simple   No data filed    Lab Results  Component Value Date   WBC 3.2 (L) 04/09/2021   HGB 12.1 04/09/2021   HCT 37.4 04/09/2021   PLT 255.0 04/09/2021   GLUCOSE 95 04/09/2021   CHOL 187 02/15/2021   TRIG 56.0 02/15/2021   HDL 71.90 02/15/2021   LDLDIRECT 129.1 10/17/2009   LDLCALC 104 (H) 02/15/2021   ALT 21 04/09/2021   AST 21 04/09/2021   NA 142 04/09/2021   K 3.9 04/09/2021   CL 106 04/09/2021   CREATININE 0.89 04/09/2021   BUN 14 04/09/2021   CO2 29 04/09/2021   TSH 2.26  02/15/2021   INR 1.1 (H) 10/16/2014   HGBA1C 6.1 02/15/2021   MICROALBUR <0.7 02/15/2021    Lab Results  Component Value Date   TSH 2.26 02/15/2021   Lab Results  Component Value Date   WBC 3.2 (L) 04/09/2021   HGB 12.1 04/09/2021   HCT 37.4 04/09/2021   MCV 88.3 04/09/2021   PLT 255.0 04/09/2021   Lab Results  Component Value Date   NA 142 04/09/2021   K 3.9 04/09/2021   CO2 29 04/09/2021   GLUCOSE 95 04/09/2021   BUN 14 04/09/2021   CREATININE 0.89 04/09/2021   BILITOT 0.6 04/09/2021   ALKPHOS 100 04/09/2021   AST 21 04/09/2021   ALT 21 04/09/2021   PROT 6.7 04/09/2021   ALBUMIN 3.8 04/09/2021   CALCIUM 8.6 04/09/2021   ANIONGAP 5 04/22/2018   GFR 70.59 04/09/2021   Lab Results  Component Value Date   CHOL 187 02/15/2021   Lab Results  Component Value Date   HDL 71.90 02/15/2021   Lab Results  Component Value Date   LDLCALC 104 (H) 02/15/2021   Lab Results  Component Value Date   TRIG 56.0 02/15/2021   Lab Results  Component Value Date   CHOLHDL 3 02/15/2021   Lab Results  Component Value Date   HGBA1C 6.1 02/15/2021       Assessment & Plan:   Problem List Items Addressed This Visit       Unprioritized   Acute cough   Relevant Orders   POC COVID-19 (Completed)   Bronchitis - Primary    abx per orders and pred taper  Can use mucinex or robitussin DM       Relevant Medications   amoxicillin-clavulanate (AUGMENTIN) 875-125 MG tablet   predniSONE (DELTASONE) 10 MG tablet     Meds ordered this encounter  Medications   amoxicillin-clavulanate (AUGMENTIN) 875-125 MG tablet    Sig: Take 1 tablet by mouth 2 (two) times daily.    Dispense:  20 tablet    Refill:  0   predniSONE (DELTASONE) 10 MG tablet    Sig: TAKE 3 TABLETS PO QD FOR 3 DAYS THEN TAKE 2 TABLETS PO QD FOR 3 DAYS THEN TAKE 1 TABLET PO QD FOR 3 DAYS THEN TAKE 1/2 TAB PO QD FOR 3 DAYS    Dispense:  20 tablet    Refill:  0    I, [name], DO, personally  preformed the services described in this documentation.  All medical record entries made by the scribe were at my direction and in my presence.  I have reviewed the chart and discharge instructions (if applicable) and agree that the record reflects my personal performance and is accurate and complete. 09/26/2021   I,Tammy [name],acting as a scribe for [name], DO.,have documented all relevant documentation on the behalf of [name], DO,as directed by  [name], DO while in the presence of [name], DO.   [name], DO

## 2021-09-26 NOTE — Assessment & Plan Note (Signed)
abx per orders and pred taper  Can use mucinex or robitussin DM

## 2021-09-26 NOTE — Patient Instructions (Signed)
Acute Bronchitis, Adult ? ?Acute bronchitis is sudden inflammation of the main airways (bronchi) that come off the windpipe (trachea) in the lungs. The swelling causes the airways to get smaller and make more mucus than normal. This can make it hard to breathe and can cause coughing or noisy breathing (wheezing). ?Acute bronchitis may last several weeks. The cough may last longer. Allergies, asthma, and exposure to smoke may make the condition worse. ?What are the causes? ?This condition can be caused by germs and by substances that irritate the lungs, including: ?Cold and flu viruses. The most common cause of this condition is the virus that causes the common cold. ?Bacteria. This is less common. ?Breathing in substances that irritate the lungs, including: ?Smoke from cigarettes and other forms of tobacco. ?Dust and pollen. ?Fumes from household cleaning products, gases, or burned fuel. ?Indoor or outdoor air pollution. ?What increases the risk? ?The following factors may make you more likely to develop this condition: ?A weak body's defense system, also called the immune system. ?A condition that affects your lungs and breathing, such as asthma. ?What are the signs or symptoms? ?Common symptoms of this condition include: ?Coughing. This may bring up clear, yellow, or green mucus from your lungs (sputum). ?Wheezing. ?Runny or stuffy nose. ?Having too much mucus in your lungs (chest congestion). ?Shortness of breath. ?Aches and pains, including sore throat or chest. ?How is this diagnosed? ?This condition is usually diagnosed based on: ?Your symptoms and medical history. ?A physical exam. ?You may also have other tests, including tests to rule out other conditions, such as pneumonia. These tests include: ?A test of lung function. ?Test of a mucus sample to look for the presence of bacteria. ?Tests to check the oxygen level in your blood. ?Blood tests. ?Chest X-ray. ?How is this treated? ?Most cases of acute  bronchitis clear up over time without treatment. Your health care provider may recommend: ?Drinking more fluids to help thin your mucus so it is easier to cough up. ?Taking inhaled medicine (inhaler) to improve air flow in and out of your lungs. ?Using a vaporizer or a humidifier. These are machines that add water to the air to help you breathe better. ?Taking a medicine that thins mucus and clears congestion (expectorant). ?Taking a medicine that prevents or stops coughing (cough suppressant). ?It is notcommon to take an antibiotic medicine for this condition. ?Follow these instructions at home: ? ?Take over-the-counter and prescription medicines only as told by your health care provider. ?Use an inhaler, vaporizer, or humidifier as told by your health care provider. ?Take two teaspoons (10 mL) of honey at bedtime to lessen coughing at night. ?Drink enough fluid to keep your urine pale yellow. ?Do not use any products that contain nicotine or tobacco. These products include cigarettes, chewing tobacco, and vaping devices, such as e-cigarettes. If you need help quitting, ask your health care provider. ?Get plenty of rest. ?Return to your normal activities as told by your health care provider. Ask your health care provider what activities are safe for you. ?Keep all follow-up visits. This is important. ?How is this prevented? ?To lower your risk of getting this condition again: ?Wash your hands often with soap and water for at least 20 seconds. If soap and water are not available, use hand sanitizer. ?Avoid contact with people who have cold symptoms. ?Try not to touch your mouth, nose, or eyes with your hands. ?Avoid breathing in smoke or chemical fumes. Breathing smoke or chemical fumes will make your   condition worse. ?Get the flu shot every year. ?Contact a health care provider if: ?Your symptoms do not improve after 2 weeks. ?You have trouble coughing up the mucus. ?Your cough keeps you awake at night. ?You have a  fever. ?Get help right away if you: ?Cough up blood. ?Feel pain in your chest. ?Have severe shortness of breath. ?Faint or keep feeling like you are going to faint. ?Have a severe headache. ?Have a fever or chills that get worse. ?These symptoms may represent a serious problem that is an emergency. Do not wait to see if the symptoms will go away. Get medical help right away. Call your local emergency services (911 in the [place] Do not drive yourself to the hospital. ?Summary ?Acute bronchitis is inflammation of the main airways (bronchi) that come off the windpipe (trachea) in the lungs. The swelling causes the airways to get smaller and make more mucus than normal. ?Drinking more fluids can help thin your mucus so it is easier to cough up. ?Take over-the-counter and prescription medicines only as told by your health care provider. ?Do not use any products that contain nicotine or tobacco. These products include cigarettes, chewing tobacco, and vaping devices, such as e-cigarettes. If you need help quitting, ask your health care provider. ?Contact a health care provider if your symptoms do not improve after 2 weeks. ?This information is not intended to replace advice given to you by your health care provider. Make sure you discuss any questions you have with your health care provider. ?Document Revised: 08/22/2020 Document Reviewed: 08/22/2020 ?Elsevier Patient Education ? 2023 [employer] ? ?

## 2021-11-06 ENCOUNTER — Other Ambulatory Visit: Payer: Self-pay | Admitting: Family Medicine

## 2021-11-06 DIAGNOSIS — J302 Other seasonal allergic rhinitis: Secondary | ICD-10-CM

## 2021-11-08 ENCOUNTER — Other Ambulatory Visit: Payer: Self-pay | Admitting: Family Medicine

## 2021-11-08 DIAGNOSIS — E43 Unspecified severe protein-calorie malnutrition: Secondary | ICD-10-CM

## 2021-11-20 ENCOUNTER — Encounter: Payer: Self-pay | Admitting: Podiatry

## 2021-11-20 ENCOUNTER — Ambulatory Visit (INDEPENDENT_AMBULATORY_CARE_PROVIDER_SITE_OTHER): Payer: 59 | Admitting: Podiatry

## 2021-11-20 ENCOUNTER — Ambulatory Visit (INDEPENDENT_AMBULATORY_CARE_PROVIDER_SITE_OTHER): Payer: 59

## 2021-11-20 DIAGNOSIS — M7671 Peroneal tendinitis, right leg: Secondary | ICD-10-CM

## 2021-11-20 DIAGNOSIS — M76821 Posterior tibial tendinitis, right leg: Secondary | ICD-10-CM

## 2021-11-20 DIAGNOSIS — M79662 Pain in left lower leg: Secondary | ICD-10-CM | POA: Diagnosis not present

## 2021-11-21 NOTE — Progress Notes (Signed)
Subjective:   Patient ID: Tammy [name], female   [name]: 61 y.o.   MRN: [id number]   HPI Patient presents stating she is developed pain on the inside of her ankle right and she needs to do this also.  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 significant lower lip obesity with lymphedema which put stress severe flatfoot deformity    ROS      Objective:  Physical Exam  Collapse of medial longitudinal arch that has occurred as time has gone on secondary to tremendous stress on her feet and overall foot structure with inflammation of the posterior tibial tendon at insertion     Assessment:  Severe lower limb lymphedema with patient who needs to be on cement floors long hours who has put stress on the posterior tibial tendon     Plan:  H&P x-ray reviewed went ahead today and did do a careful injection of the posterior tibial tendon as it inserts into the navicular and went ahead casted for new functional orthotics to try to hold the arch up and take pressure off of this.  Patient will be seen back when ready  X-rays indicate there is collapse medial longitudinal arch bilateral

## 2021-11-28 ENCOUNTER — Ambulatory Visit (INDEPENDENT_AMBULATORY_CARE_PROVIDER_SITE_OTHER): Payer: 59 | Admitting: Family Medicine

## 2021-11-28 ENCOUNTER — Ambulatory Visit (HOSPITAL_BASED_OUTPATIENT_CLINIC_OR_DEPARTMENT_OTHER)
Admission: RE | Admit: 2021-11-28 | Discharge: 2021-11-28 | Disposition: A | Discharge status: Home / Self Care | Payer: 59 | Source: Ambulatory Visit | Attending: Family Medicine | Admitting: Family Medicine

## 2021-11-28 ENCOUNTER — Encounter: Payer: Self-pay | Admitting: Family Medicine

## 2021-11-28 VITALS — BP 128/88 | HR 70 | Temp 98.5°F | Resp 18 | Ht 65.0 in | Wt 343.4 lb

## 2021-11-28 DIAGNOSIS — I10 Essential (primary) hypertension: Secondary | ICD-10-CM

## 2021-11-28 DIAGNOSIS — I890 Lymphedema, not elsewhere classified: Secondary | ICD-10-CM

## 2021-11-28 DIAGNOSIS — K219 Gastro-esophageal reflux disease without esophagitis: Secondary | ICD-10-CM

## 2021-11-28 DIAGNOSIS — M545 Low back pain, unspecified: Secondary | ICD-10-CM | POA: Insufficient documentation

## 2021-11-28 DIAGNOSIS — E1165 Type 2 diabetes mellitus with hyperglycemia: Secondary | ICD-10-CM

## 2021-11-28 LAB — CBC WITH DIFFERENTIAL/PLATELET
Basophils Absolute: 0 10*3/uL (ref 0.0–0.1)
Basophils Relative: 1.2 % (ref 0.0–3.0)
Eosinophils Absolute: 0.1 10*3/uL (ref 0.0–0.7)
Eosinophils Relative: 1.9 % (ref 0.0–5.0)
HCT: 38.5 % (ref 36.0–46.0)
Hemoglobin: 12.2 g/dL (ref 12.0–15.0)
Lymphocytes Relative: 35.6 % (ref 12.0–46.0)
Lymphs Abs: 1.1 10*3/uL (ref 0.7–4.0)
MCHC: 31.7 g/dL (ref 30.0–36.0)
MCV: 87.7 fl (ref 78.0–100.0)
Monocytes Absolute: 0.2 10*3/uL (ref 0.1–1.0)
Monocytes Relative: 8 % (ref 3.0–12.0)
Neutro Abs: 1.6 10*3/uL (ref 1.4–7.7)
Neutrophils Relative %: 53.3 % (ref 43.0–77.0)
Platelets: 243 10*3/uL (ref 150.0–400.0)
RBC: 4.39 Mil/uL (ref 3.87–5.11)
RDW: 15.2 % (ref 11.5–15.5)
WBC: 3 10*3/uL — ABNORMAL LOW (ref 4.0–10.5)

## 2021-11-28 LAB — COMPREHENSIVE METABOLIC PANEL
ALT: 20 U/L (ref 0–35)
AST: 20 U/L (ref 0–37)
Albumin: 3.9 g/dL (ref 3.5–5.2)
Alkaline Phosphatase: 92 U/L (ref 39–117)
BUN: 14 mg/dL (ref 6–23)
CO2: 30 mEq/L (ref 19–32)
Calcium: 8.6 mg/dL (ref 8.4–10.5)
Chloride: 105 mEq/L (ref 96–112)
Creatinine, Ser: 0.99 mg/dL (ref 0.40–1.20)
GFR: 61.84 mL/min (ref >60.00)
Glucose, Bld: 95 mg/dL (ref 70–99)
Potassium: 4.2 mEq/L (ref 3.5–5.1)
Sodium: 142 mEq/L (ref 135–145)
Total Bilirubin: 0.6 mg/dL (ref 0.2–1.2)
Total Protein: 6.9 g/dL (ref 6.0–8.3)

## 2021-11-28 LAB — H. PYLORI ANTIBODY, IGG: H Pylori IgG: NEGATIVE

## 2021-11-28 MED ORDER — TIZANIDINE HCL 4 MG PO TABS: 4.0000 mg | ORAL_TABLET | Freq: Four times a day (QID) | ORAL | 0 refills | Status: DC | PRN

## 2021-11-28 MED ORDER — PANTOPRAZOLE SODIUM 40 MG PO TBEC: 80.0000 mg | DELAYED_RELEASE_TABLET | Freq: Every day | ORAL | 3 refills | Status: DC

## 2021-11-28 MED ORDER — AMLODIPINE BESYLATE 2.5 MG PO TABS: 2.5000 mg | ORAL_TABLET | Freq: Two times a day (BID) | ORAL | 3 refills | Status: AC

## 2021-11-28 MED ORDER — TIRZEPATIDE 2.5 MG/0.5ML ~~LOC~~ SOAJ: 2.5000 mg | SUBCUTANEOUS | 1 refills | Status: DC

## 2021-11-28 NOTE — Assessment & Plan Note (Signed)
Refer to PT

## 2021-11-28 NOTE — Progress Notes (Signed)
Subjective:   By signing my name below, I, Luiz Ochoa, attest that this documentation has been prepared under the direction and in the presence of [name], DO  11/28/2021   Patient ID: Tammy [name], female    [name]: 13-Jan-1961, 61 y.o.   MRN: [id number]  Chief Complaint  Patient presents with   Back Pain    Lower left, x2 weeks, no falls or injuries, no urinary concerns.     HPI Patient is in today for follow up visit.  She reports having a "pinching" pain in the lumbar region of her back for the past 2 weeks. She denies having any tingling in her legs but adds that they have always been weak. She states that after seeing her podiatrist and buying new shoes, her back pain has mildly improved. Her back pain is modified when she arches her back or palpates the area.   She sometimes have reflux which she tries to manage with pantoprazole, but she has recently had a bad taste in her mouth due to reflux.  She no longer takes tylenol at night because it causes her to have headaches the next day. Her compression socks have been giving her leg cramps due to them being too tight. She plans to see a specialist to have this managed.  Past Medical History:  Diagnosis Date   Acute on chronic appendicitis s/p lap appy 06/10/2012 06/10/2012   surgery done 06-10-12   Arthritis    ankle   Back pain    Chest pain    Complication of anesthesia    Constipation    Diabetes mellitus    TYPE 2- no meds in several months-was taken off meds -monitoring blood sugar.   GERD (gastroesophageal reflux disease)    History of colon polyps    Hyperlipidemia    Hypertension    Joint pain    Lactose intolerance    Morbid obesity - BMI > 50 12/13/2012   Pneumonia    none recent   PONV (postoperative nausea and vomiting)    Poor venous access    "usually difficult stick"   Prediabetes    Sinusitis    SOB (shortness of breath)    Swelling    Vitamin D deficiency     Past Surgical  History:  Procedure Laterality Date   ABDOMINAL HYSTERECTOMY     APPENDECTOMY     2'14   COLONOSCOPY  04/11/11   5 mm polyp removed but not recovered   COLONOSCOPY WITH PROPOFOL N/A 05/11/2017   Procedure: COLONOSCOPY WITH PROPOFOL;  Surgeon: Gatha Mayer, MD;  Location: [facility];  Service: Endoscopy;  Laterality: N/A;   GASTRIC ROUX-EN-Y N/A 02/27/2015   Procedure: LAPAROSCOPIC ROUX-EN-Y GASTRIC BYPASS WITH UPPER ENDOSCOPY;  Surgeon: Johnathan Hausen, MD;  Location: [facility];  Service: General;  Laterality: N/A;   KNEE SURGERY Right     KNEE SURGERY    LAPAROSCOPIC APPENDECTOMY  06/10/2012   Procedure: APPENDECTOMY LAPAROSCOPIC;  Surgeon: Adin Hector, MD;  Location: [facility];  Service: General;  Laterality: N/A;   UPPER GASTROINTESTINAL ENDOSCOPY  04/14/2006   Normal    Family History  Problem Relation Age of Onset   Hyperlipidemia Mother    Hypertension Mother    Cancer Mother 21       hodgkins lymphoma   Diabetes Father    Heart failure Father    Heart disease Father    Hyperlipidemia Brother    Diabetes Paternal Grandmother  Breast cancer Paternal Aunt        unsure of age   Breast cancer Paternal Aunt    Heart disease Cousin    Colon cancer Neg Hx    Heart attack Neg Hx    Sudden death Neg Hx    Colon polyps Neg Hx    Esophageal cancer Neg Hx    Stomach cancer Neg Hx    Rectal cancer Neg Hx     Social History   Socioeconomic History   Marital status: Married    Spouse name: Publishing rights manager   Number of children: 0   Years of education: Not on file   Highest education level: Not on file  Occupational History   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[employer]  Tobacco Use   Smoking status: Never   Smokeless tobacco: Never  Vaping Use   Vaping Use: Never used  Substance and Sexual Activity   Alcohol use: No    Alcohol/week: 0.0 standard drinks of alcohol   Drug use: No   Sexual activity: Not on file  Other Topics Concern   Not on file  Social  History Narrative   Exercise- no   Social Determinants of Health   Financial Resource Strain: Not on file  Food Insecurity: Not on file  Transportation Needs: Not on file  Physical Activity: Not on file  Stress: Not on file  Social Connections: Not on file  Intimate Partner Violence: Not on file    Outpatient Medications Prior to Visit  Medication Sig Dispense Refill   carvedilol (COREG) 6.25 MG tablet TAKE 1 TABLET BY MOUTH  TWICE DAILY WITH MEALS 180 tablet 3   fluticasone (FLONASE) 50 MCG/ACT nasal spray SHAKE LIQUID AND USE 1 SPRAY IN EACH NOSTRIL IN THE MORNING AND AT BEDTIME 48 g 0   furosemide (LASIX) 40 MG tablet TAKE 1 TABLET BY MOUTH  DAILY 90 tablet 1   glucose blood test strip Check blood sugar twice daily 100 each 11   loratadine (CLARITIN) 10 MG tablet Take 1 tablet (10 mg total) by mouth daily. 30 tablet 11   meloxicam (MOBIC) 15 MG tablet Take 1 tablet daily with food for 14 days. Then take as needed. 30 tablet 1   Multiple Vitamins-Minerals (MULTIVITAMIN ADULT PO) Take 1 tablet by mouth daily. flintstone vitamins     NONFORMULARY OR COMPOUNDED ITEM Thigh high compression socks  20-30 mmhg    Re- low ext edema 1 each 1   NONFORMULARY OR COMPOUNDED ITEM Compression socks 20-70m/hg  #1  as directed 1 each 1   amLODipine (NORVASC) 2.5 MG tablet TAKE 1 TABLET BY MOUTH  TWICE DAILY 180 tablet 1   amoxicillin-clavulanate (AUGMENTIN) 875-125 MG tablet Take 1 tablet by mouth 2 (two) times daily. 20 tablet 0   pantoprazole (PROTONIX) 40 MG tablet Take 2 tablets (80 mg total) by mouth daily. 180 tablet 3   predniSONE (DELTASONE) 10 MG tablet TAKE 3 TABLETS PO QD FOR 3 DAYS THEN TAKE 2 TABLETS PO QD FOR 3 DAYS THEN TAKE 1 TABLET PO QD FOR 3 DAYS THEN TAKE 1/2 TAB PO QD FOR 3 DAYS 20 tablet 0   No facility-administered medications prior to visit.    Allergies  Allergen Reactions   Ace Inhibitors Anaphylaxis    Bowel angioedema   Cheese Nausea And Vomiting   Red Dye Nausea  And Vomiting   Tuna [Fish Allergy] Nausea And Vomiting    Review of Systems  Constitutional:  Negative for fever and malaise/fatigue.  HENT:  Negative for congestion, sinus pain and sore throat.   Eyes:  Negative for blurred vision.  Respiratory:  Negative for cough, shortness of breath and wheezing.   Cardiovascular:  Negative for chest pain, palpitations and leg swelling.  Gastrointestinal:  Negative for abdominal pain, constipation, diarrhea, nausea and vomiting.       (+) Reflux  Genitourinary:  Negative for dysuria, frequency and hematuria.  Musculoskeletal:  Positive for back pain (in the lumbar region). Negative for joint pain and myalgias.       (+) Leg cramps due to tight compression socks  Skin:  Negative for rash.  Neurological:  Positive for headaches (when she takes tylenol). Negative for loss of consciousness.       Objective:    Physical Exam Vitals and nursing note reviewed.  Constitutional:      Appearance: Normal appearance. She is not ill-appearing.  HENT:     Head: Normocephalic and atraumatic.     Right Ear: External ear normal.     Left Ear: External ear normal.  Eyes:     Extraocular Movements: Extraocular movements intact.     Pupils: Pupils are equal, round, and reactive to light.  Cardiovascular:     Rate and Rhythm: Normal rate and regular rhythm.     Pulses: Normal pulses.     Heart sounds: Normal heart sounds. No murmur heard.    No gallop.  Pulmonary:     Effort: Pulmonary effort is normal. No respiratory distress.     Breath sounds: No wheezing or rales.  Neurological:     Mental Status: She is alert and oriented to person, place, and time.  Psychiatric:        Mood and Affect: Mood normal.        Judgment: Judgment normal.     BP 128/88 (BP Location: Left Arm, Patient Position: Sitting, Cuff Size: Normal)   Pulse 70   Temp 98.5 F (36.9 C) (Oral)   Resp 18   Ht '5\' 5"'$  (1.651 m)   Wt (!) 343 lb 6.4 oz (155.8 kg)   SpO2 98%   BMI  57.14 kg/m  Wt Readings from Last 3 Encounters:  11/28/21 (!) 343 lb 6.4 oz (155.8 kg)  09/26/21 (!) 356 lb 9.6 oz (161.8 kg)  08/08/21 (!) 353 lb (160.1 kg)    Diabetic Foot Exam - Simple   No data filed    Lab Results  Component Value Date   WBC 3.2 (L) 04/09/2021   HGB 12.1 04/09/2021   HCT 37.4 04/09/2021   PLT 255.0 04/09/2021   GLUCOSE 95 04/09/2021   CHOL 187 02/15/2021   TRIG 56.0 02/15/2021   HDL 71.90 02/15/2021   LDLDIRECT 129.1 10/17/2009   LDLCALC 104 (H) 02/15/2021   ALT 21 04/09/2021   AST 21 04/09/2021   NA 142 04/09/2021   K 3.9 04/09/2021   CL 106 04/09/2021   CREATININE 0.89 04/09/2021   BUN 14 04/09/2021   CO2 29 04/09/2021   TSH 2.26 02/15/2021   INR 1.1 (H) 10/16/2014   HGBA1C 6.1 02/15/2021   MICROALBUR <0.7 02/15/2021    Lab Results  Component Value Date   TSH 2.26 02/15/2021   Lab Results  Component Value Date   WBC 3.2 (L) 04/09/2021   HGB 12.1 04/09/2021   HCT 37.4 04/09/2021   MCV 88.3 04/09/2021   PLT 255.0 04/09/2021   Lab Results  Component Value Date   NA  142 04/09/2021   K 3.9 04/09/2021   CO2 29 04/09/2021   GLUCOSE 95 04/09/2021   BUN 14 04/09/2021   CREATININE 0.89 04/09/2021   BILITOT 0.6 04/09/2021   ALKPHOS 100 04/09/2021   AST 21 04/09/2021   ALT 21 04/09/2021   PROT 6.7 04/09/2021   ALBUMIN 3.8 04/09/2021   CALCIUM 8.6 04/09/2021   ANIONGAP 5 04/22/2018   GFR 70.59 04/09/2021   Lab Results  Component Value Date   CHOL 187 02/15/2021   Lab Results  Component Value Date   HDL 71.90 02/15/2021   Lab Results  Component Value Date   LDLCALC 104 (H) 02/15/2021   Lab Results  Component Value Date   TRIG 56.0 02/15/2021   Lab Results  Component Value Date   CHOLHDL 3 02/15/2021   Lab Results  Component Value Date   HGBA1C 6.1 02/15/2021       Assessment & Plan:   Problem List Items Addressed This Visit       Unprioritized   Essential hypertension   Relevant Medications   amLODipine  (NORVASC) 2.5 MG tablet   Uncontrolled type 2 diabetes mellitus with hyperglycemia (HCC)    Start mounjaro  Recheck labs 3 months       Relevant Medications   tirzepatide (MOUNJARO) 2.5 MG/0.5ML Pen   Lymphedema    Refer to PT       Relevant Orders   Ambulatory referral to Physical Therapy   Acute left-sided low back pain without sciatica - Primary    Muscle relaxer  Xray  Consider sport med/ ortho       Relevant Medications   tiZANidine (ZANAFLEX) 4 MG tablet   Other Relevant Orders   DG Lumbar Spine Complete   Other Visit Diagnoses     Gastroesophageal reflux disease       Relevant Medications   pantoprazole (PROTONIX) 40 MG tablet   Other Relevant Orders   Ambulatory referral to Gastroenterology   H. pylori antibody, IgG   Comprehensive metabolic panel   CBC with Differential/Platelet   Type 2 diabetes mellitus with hyperglycemia, without long-term current use of insulin (HCC)       Relevant Medications   tirzepatide (MOUNJARO) 2.5 MG/0.5ML Pen        Meds ordered this encounter  Medications   pantoprazole (PROTONIX) 40 MG tablet    Sig: Take 2 tablets (80 mg total) by mouth daily.    Dispense:  180 tablet    Refill:  3    Requesting 1 year supply   amLODipine (NORVASC) 2.5 MG tablet    Sig: Take 1 tablet (2.5 mg total) by mouth 2 (two) times daily.    Dispense:  180 tablet    Refill:  3    Requesting 1 year supply   tiZANidine (ZANAFLEX) 4 MG tablet    Sig: Take 1 tablet (4 mg total) by mouth every 6 (six) hours as needed for muscle spasms.    Dispense:  30 tablet    Refill:  0   tirzepatide (MOUNJARO) 2.5 MG/0.5ML Pen    Sig: Inject 2.5 mg into the skin once a week.    Dispense:  2 mL    Refill:  1    I, [name], DO, personally preformed the services described in this documentation.  All medical record entries made by the scribe were at my direction and in my presence.  I have reviewed the chart and discharge instructions (if  applicable) and  agree that the record reflects my personal performance and is accurate and complete. 11/28/2021   I,Tinashe Williams,acting as a scribe for [name], DO.,have documented all relevant documentation on the behalf of [name], DO,as directed by  [name], DO while in the presence of [name], DO.    [name], DO

## 2021-11-28 NOTE — Assessment & Plan Note (Signed)
Muscle relaxer  Xray  Consider sport med/ ortho

## 2021-11-28 NOTE — Patient Instructions (Signed)
Acute Back Pain, Adult Acute back pain is sudden and usually short-lived. It is often caused by an injury to the muscles and tissues in the back. The injury may result from: A muscle, tendon, or ligament getting overstretched or torn. Ligaments are tissues that connect bones to each other. Lifting something improperly can cause a back strain. Wear and tear (degeneration) of the spinal disks. Spinal disks are circular tissue that provide cushioning between the bones of the spine (vertebrae). Twisting motions, such as while playing sports or doing yard work. A hit to the back. Arthritis. You may have a physical exam, lab tests, and imaging tests to find the cause of your pain. Acute back pain usually goes away with rest and home care. Follow these instructions at home: Managing pain, stiffness, and swelling Take over-the-counter and prescription medicines only as told by your health care provider. Treatment may include medicines for pain and inflammation that are taken by mouth or applied to the skin, or muscle relaxants. Your health care provider may recommend applying ice during the first 24-48 hours after your pain starts. To do this: Put ice in a plastic bag. Place a towel between your skin and the bag. Leave the ice on for 20 minutes, 2-3 times a day. Remove the ice if your skin turns bright red. This is very important. If you cannot feel pain, heat, or cold, you have a greater risk of damage to the area. If directed, apply heat to the affected area as often as told by your health care provider. Use the heat source that your health care provider recommends, such as a moist heat pack or a heating pad. Place a towel between your skin and the heat source. Leave the heat on for 20-30 minutes. Remove the heat if your skin turns bright red. This is especially important if you are unable to feel pain, heat, or cold. You have a greater risk of getting burned. Activity  Do not stay in bed. Staying in  bed for more than 1-2 days can delay your recovery. Sit up and stand up straight. Avoid leaning forward when you sit or hunching over when you stand. If you work at a desk, sit close to it so you do not need to lean over. Keep your chin tucked in. Keep your neck drawn back, and keep your elbows bent at a 90-degree angle (right angle). Sit high and close to the steering wheel when you drive. Add lower back (lumbar) support to your car seat, if needed. Take short walks on even surfaces as soon as you are able. Try to increase the length of time you walk each day. Do not sit, drive, or stand in one place for more than 30 minutes at a time. Sitting or standing for long periods of time can put stress on your back. Do not drive or use heavy machinery while taking prescription pain medicine. Use proper lifting techniques. When you bend and lift, use positions that put less stress on your back: Bend your knees. Keep the load close to your body. Avoid twisting. Exercise regularly as told by your health care provider. Exercising helps your back heal faster and helps prevent back injuries by keeping muscles strong and flexible. Work with a physical therapist to make a safe exercise program, as recommended by your health care provider. Do any exercises as told by your physical therapist. Lifestyle Maintain a healthy weight. Extra weight puts stress on your back and makes it difficult to have good   posture. Avoid activities or situations that make you feel anxious or stressed. Stress and anxiety increase muscle tension and can make back pain worse. Learn ways to manage anxiety and stress, such as through exercise. General instructions Sleep on a firm mattress in a comfortable position. Try lying on your side with your knees slightly bent. If you lie on your back, put a pillow under your knees. Keep your head and neck in a straight line with your spine (neutral position) when using electronic equipment like  smartphones or pads. To do this: Raise your smartphone or pad to look at it instead of bending your head or neck to look down. Put the smartphone or pad at the level of your face while looking at the screen. Follow your treatment plan as told by your health care provider. This may include: Cognitive or behavioral therapy. Acupuncture or massage therapy. Meditation or yoga. Contact a health care provider if: You have pain that is not relieved with rest or medicine. You have increasing pain going down into your legs or buttocks. Your pain does not improve after 2 weeks. You have pain at night. You lose weight without trying. You have a fever or chills. You develop nausea or vomiting. You develop abdominal pain. Get help right away if: You develop new bowel or bladder control problems. You have unusual weakness or numbness in your arms or legs. You feel faint. These symptoms may represent a serious problem that is an emergency. Do not wait to see if the symptoms will go away. Get medical help right away. Call your local emergency services (911 in the [place] Do not drive yourself to the hospital. Summary Acute back pain is sudden and usually short-lived. Use proper lifting techniques. When you bend and lift, use positions that put less stress on your back. Take over-the-counter and prescription medicines only as told by your health care provider, and apply heat or ice as told. This information is not intended to replace advice given to you by your health care provider. Make sure you discuss any questions you have with your health care provider. Document Revised: 07/13/2020 Document Reviewed: 07/13/2020 Elsevier Patient Education  2023 [employer]  

## 2021-11-28 NOTE — Assessment & Plan Note (Signed)
Start [employer] labs 3 months

## 2021-12-02 ENCOUNTER — Telehealth: Payer: Self-pay | Admitting: Family Medicine

## 2021-12-02 NOTE — Telephone Encounter (Signed)
[facility] called stating that referral that was placed with them would not be able to be facilitated as they don't treat Lymphedema at their location. Rep stated that the following office can treat her and wanted to look into rerouting the referral there:  [place], [place], [place] [address] P: [phone] F: [phone]

## 2021-12-04 ENCOUNTER — Telehealth: Payer: Self-pay | Admitting: Family Medicine

## 2021-12-04 NOTE — Telephone Encounter (Signed)
[facility] just wanted to advise they do not treat non cancerous lymphedema. She stated for future reference we could try [facility] in [place], for upper extremities call [phone] and lower [phone]. Also Forestine Na at [phone].

## 2021-12-11 ENCOUNTER — Ambulatory Visit (INDEPENDENT_AMBULATORY_CARE_PROVIDER_SITE_OTHER): Payer: 59

## 2021-12-11 ENCOUNTER — Encounter (INDEPENDENT_AMBULATORY_CARE_PROVIDER_SITE_OTHER): Payer: Self-pay

## 2021-12-11 ENCOUNTER — Encounter: Payer: Self-pay | Admitting: Family Medicine

## 2021-12-11 DIAGNOSIS — M76821 Posterior tibial tendinitis, right leg: Secondary | ICD-10-CM | POA: Diagnosis not present

## 2021-12-11 DIAGNOSIS — M76822 Posterior tibial tendinitis, left leg: Secondary | ICD-10-CM | POA: Diagnosis not present

## 2021-12-11 DIAGNOSIS — M722 Plantar fascial fibromatosis: Secondary | ICD-10-CM

## 2021-12-11 NOTE — Progress Notes (Signed)
Patient presents today to pick up custom molded foot orthotics, diagnosed with plantar fasciitis by Dr. Paulla Dolly.   Orthotics were dispensed and fit was satisfactory. Reviewed instructions for break-in and wear. Written instructions given to patient.  Patient will follow up as needed.   [facility] - order # [id number]

## 2022-01-11 ENCOUNTER — Other Ambulatory Visit: Payer: Self-pay

## 2022-01-11 ENCOUNTER — Emergency Department (HOSPITAL_BASED_OUTPATIENT_CLINIC_OR_DEPARTMENT_OTHER)
Admission: EM | Admit: 2022-01-11 | Discharge: 2022-01-11 | Disposition: A | Discharge status: Home / Self Care | Payer: 59 | Attending: Emergency Medicine | Admitting: Emergency Medicine

## 2022-01-11 ENCOUNTER — Emergency Department (HOSPITAL_BASED_OUTPATIENT_CLINIC_OR_DEPARTMENT_OTHER): Payer: 59

## 2022-01-11 ENCOUNTER — Encounter (HOSPITAL_BASED_OUTPATIENT_CLINIC_OR_DEPARTMENT_OTHER): Payer: Self-pay | Admitting: Emergency Medicine

## 2022-01-11 DIAGNOSIS — I10 Essential (primary) hypertension: Secondary | ICD-10-CM | POA: Insufficient documentation

## 2022-01-11 DIAGNOSIS — Z79899 Other long term (current) drug therapy: Secondary | ICD-10-CM | POA: Insufficient documentation

## 2022-01-11 DIAGNOSIS — U071 COVID-19: Secondary | ICD-10-CM | POA: Diagnosis not present

## 2022-01-11 DIAGNOSIS — R059 Cough, unspecified: Secondary | ICD-10-CM | POA: Diagnosis present

## 2022-01-11 LAB — SARS CORONAVIRUS 2 BY RT PCR: SARS Coronavirus 2 by RT PCR: POSITIVE — AB

## 2022-01-11 MED ORDER — NIRMATRELVIR/RITONAVIR (PAXLOVID)TABLET
3.0000 | ORAL_TABLET | Freq: Two times a day (BID) | ORAL | 0 refills | Status: AC
Start: 2022-01-11 — End: 2022-01-16

## 2022-01-11 MED ORDER — ACETAMINOPHEN 500 MG PO TABS
1000.0000 mg | ORAL_TABLET | Freq: Once | ORAL | Status: AC | PRN
  Administered 2022-01-11: 1000 mg via ORAL
  Filled 2022-01-11: qty 2

## 2022-01-11 NOTE — Discharge Instructions (Addendum)
If you develop high fever, severe cough or cough with blood, trouble breathing, severe headache, neck pain/stiffness, vomiting, or any other new/concerning symptoms then return to the ER for evaluation  

## 2022-01-11 NOTE — ED Notes (Signed)
Patient reports headache. Patient reports a wet cough . Denies any shortness of breath

## 2022-01-11 NOTE — ED Provider Notes (Signed)
[facility] EMERGENCY DEPARTMENT Provider Note   CSN: [id number] Arrival date & time: 01/11/22  1135     History  Chief Complaint  Patient presents with   Cough    Tammy [name] is a 61 y.o. female.  HPI 61 year old female presents with URI symptoms. Started having sore throat yesterday. Cough and fever as well. Mild chest congestion. Some CP when coughing. No significant dyspnea. Given tylenol in waiting room and now feels a lot better. History of HTN. She states she's a pre-diabetic. Has sore throat but no trouble swallowing.  Home Medications Prior to Admission medications   Medication Sig Start Date End Date Taking? Authorizing Provider  nirmatrelvir/ritonavir EUA (PAXLOVID) 20 x 150 MG & 10 x '100MG'$  TABS Take 3 tablets by mouth 2 (two) times daily for 5 days. Patient GFR is 61. Take nirmatrelvir (150 mg) two tablets twice daily for 5 days and ritonavir (100 mg) one tablet twice daily for 5 days. 01/11/22 01/16/22 Yes Sherwood Gambler, MD  amLODipine (NORVASC) 2.5 MG tablet Take 1 tablet (2.5 mg total) by mouth 2 (two) times daily. 11/28/21   Roma Schanz R, DO  carvedilol (COREG) 6.25 MG tablet TAKE 1 TABLET BY MOUTH  TWICE DAILY WITH MEALS 02/13/21   Carollee Herter, Kendrick Fries R, DO  fluticasone (FLONASE) 50 MCG/ACT nasal spray SHAKE LIQUID AND USE 1 SPRAY IN EACH NOSTRIL IN THE MORNING AND AT BEDTIME 11/06/21   Carollee Herter, Kendrick Fries R, DO  furosemide (LASIX) 40 MG tablet TAKE 1 TABLET BY MOUTH  DAILY 11/11/21   Roma Schanz R, DO  glucose blood test strip Check blood sugar twice daily 09/05/20   Carollee Herter, Alferd Apa, DO  loratadine (CLARITIN) 10 MG tablet Take 1 tablet (10 mg total) by mouth daily. 02/15/21   [name], DO  meloxicam (MOBIC) 15 MG tablet Take 1 tablet daily with food for 14 days. Then take as needed. 04/04/20   Nuala Alpha, MD  Multiple Vitamins-Minerals (MULTIVITAMIN ADULT PO) Take 1 tablet by mouth daily. flintstone vitamins     [provider]  NONFORMULARY OR COMPOUNDED ITEM Thigh high compression socks  20-30 mmhg    Re- low ext edema 03/02/18   Carollee Herter, Kendrick Fries R, DO  NONFORMULARY OR COMPOUNDED ITEM Compression socks 20-38m/hg  #1  as directed 06/21/21   LCarollee Herter YAlferd Apa DO  pantoprazole (PROTONIX) 40 MG tablet Take 2 tablets (80 mg total) by mouth daily. 11/28/21   LAnn Held DO  tirzepatide ([facility] 2.5 MG/0.5ML Pen Inject 2.5 mg into the skin once a week. 11/28/21   LAnn Held DO  tiZANidine (ZANAFLEX) 4 MG tablet Take 1 tablet (4 mg total) by mouth every 6 (six) hours as needed for muscle spasms. 11/28/21   LAnn Held DO      Allergies    Ace inhibitors, Cheese, Red dye, and Tuna [fish allergy]    Review of Systems   Review of Systems  Constitutional:  Positive for fever.  HENT:  Positive for sore throat.   Respiratory:  Positive for cough. Negative for shortness of breath.   Gastrointestinal:  Negative for vomiting.  Neurological:  Positive for headaches.    Physical Exam Updated Vital Signs BP 133/60   Pulse 82   Temp 99 F (37.2 C)   Resp 18   Ht '5\' 5"'$  (1.651 m)   Wt (!) 152 kg   SpO2 100%   BMI  55.75 kg/m  Physical Exam Vitals and nursing note reviewed.  Constitutional:      General: She is not in acute distress.    Appearance: She is well-developed. She is not ill-appearing or diaphoretic.  HENT:     Head: Normocephalic and atraumatic.     Mouth/Throat:     Pharynx: No oropharyngeal exudate.  Cardiovascular:     Rate and Rhythm: Normal rate and regular rhythm.     Heart sounds: Normal heart sounds.  Pulmonary:     Effort: Pulmonary effort is normal.     Breath sounds: Normal breath sounds. No wheezing, rhonchi or rales.  Abdominal:     General: There is no distension.  Skin:    General: Skin is warm and dry.  Neurological:     Mental Status: She is alert.     ED Results / Procedures / Treatments   Labs (all labs  ordered are listed, but only abnormal results are displayed) Labs Reviewed  SARS CORONAVIRUS 2 BY RT PCR - Abnormal; Notable for the following components:      Result Value   SARS Coronavirus 2 by RT PCR POSITIVE (*)    All other components within normal limits    EKG None  Radiology DG Chest 2 View  Result Date: 01/11/2022 CLINICAL DATA:  Cough, slow gait, headache, fever since last night. EXAM: CHEST - 2 VIEW COMPARISON:  Chest x-ray dated 04/09/2021 FINDINGS: Heart size and mediastinal contours are within normal limits. Lungs are clear. No pleural effusion or pneumothorax is seen. Mild degenerative spondylosis of the mid and lower thoracic spine. No acute-appearing osseous abnormality. IMPRESSION: No active cardiopulmonary disease.  No evidence of pneumonia. Electronically Signed   By: Franki Cabot M.D.   On: 01/11/2022 12:07    Procedures Procedures    Medications Ordered in ED Medications  acetaminophen (TYLENOL) tablet 1,000 mg (1,000 mg Oral Given 01/11/22 1233)    ED Course/ Medical Decision Making/ A&P                           Medical Decision Making Amount and/or Complexity of Data Reviewed Radiology: ordered.  Risk OTC drugs.   Covid test is positive, which explains her symptoms. Was febrile and tachycardic in waiting room, but now vitals are normal. Doubt any covid complication at this point, and with now normal vitals, no indication for labs. Is taking PO. CXR from triage viewed/interpreted, no pneumonia. Discussed paxlovid, and patient would like to take this. Otherwise we discussed supportive care. D/c home with return precautions.        Final Clinical Impression(s) / ED Diagnoses Final diagnoses:  COVID-19    Rx / DC Orders ED Discharge Orders          Ordered    nirmatrelvir/ritonavir EUA (PAXLOVID) 20 x 150 MG & 10 x '100MG'$  TABS  2 times daily        01/11/22 1335              Sherwood Gambler, MD 01/11/22 1515

## 2022-01-11 NOTE — ED Triage Notes (Signed)
Pt arrives pov, slow gait, c/o HA, cough fever since last night. Denies otc meds pta

## 2022-01-13 ENCOUNTER — Ambulatory Visit (INDEPENDENT_AMBULATORY_CARE_PROVIDER_SITE_OTHER): Payer: 59 | Admitting: Family Medicine

## 2022-01-13 ENCOUNTER — Telehealth: Payer: Self-pay

## 2022-01-13 VITALS — BP 130/80 | HR 83 | Temp 100.4°F | Resp 18 | Ht 65.0 in | Wt 342.0 lb

## 2022-01-13 DIAGNOSIS — J40 Bronchitis, not specified as acute or chronic: Secondary | ICD-10-CM

## 2022-01-13 DIAGNOSIS — E1165 Type 2 diabetes mellitus with hyperglycemia: Secondary | ICD-10-CM | POA: Diagnosis not present

## 2022-01-13 DIAGNOSIS — U071 COVID-19: Secondary | ICD-10-CM | POA: Diagnosis not present

## 2022-01-13 MED ORDER — PREDNISONE 10 MG PO TABS: ORAL_TABLET | ORAL | 0 refills | Status: DC

## 2022-01-13 MED ORDER — AZITHROMYCIN 250 MG PO TABS: ORAL_TABLET | ORAL | 0 refills | Status: AC

## 2022-01-13 MED ORDER — PROMETHAZINE-DM 6.25-15 MG/5ML PO SYRP: 5.0000 mL | ORAL_SOLUTION | Freq: Four times a day (QID) | ORAL | 0 refills | Status: DC | PRN

## 2022-01-13 MED ORDER — TIRZEPATIDE 5 MG/0.5ML ~~LOC~~ SOAJ
5.0000 mg | SUBCUTANEOUS | 1 refills | Status: DC
Start: 2022-01-13 — End: 2022-06-24

## 2022-01-13 MED ORDER — METHYLPREDNISOLONE ACETATE 80 MG/ML IJ SUSP
80.0000 mg | Freq: Once | INTRAMUSCULAR | Status: AC
  Administered 2022-01-13: 80 mg via INTRAMUSCULAR

## 2022-01-13 NOTE — Patient Instructions (Signed)
Quarantine and Isolation Quarantine and isolation refer to local and travel restrictions to protect the public and travelers from contagious diseases that constitute a public health threat. Contagious diseases are diseases that can spread from one person to another. Quarantine and isolation help to protect the public by preventing exposure to people who have or may have a contagious disease. Isolation separates people who are sick with a contagious disease from people who are not sick. Quarantine separates and restricts the movement of people who were exposed to a contagious disease to see if they become sick. You may be put in quarantine or isolation if you have been exposed to or diagnosed with any of the following diseases: Severe acute respiratory syndromes, such as COVID-19. Cholera. Diphtheria. Tuberculosis. Plague. Smallpox. Yellow fever. Viral hemorrhagic fevers, such as [place], Ebola, and [place]. When to quarantine or isolate Follow these rules, whether you have been vaccinated or not: Stay home and isolate from others when you are sick with a contagious disease. Isolate when you test positive for a contagious disease, even if you do not have symptoms. Isolate if you are sick and suspect that you may have a contagious disease. If you suspect that you have a contagious disease, get tested. If your test results are negative, you can end your isolation. If your test results are positive, follow the full isolation recommendations as told by your health care provider or local health authorities. Quarantine and stay away from others when you have been in close contact with someone who has tested positive for a contagious disease. Close contact is defined as being less than 6 ft (1.8 m) away from an infected person for a total of 15 minutes or more over a 24-hour period. Do not go to places where you are unable to wear a mask, such as restaurants and some gyms. Stay home and separate  from others as much as possible. Avoid being around people who may get very sick from the contagious disease that you have. Use a separate bathroom, if possible. Do not travel. For travel guidance, visit the CDC's travel webpage at wwwnc.cdc.gov/travel/ Follow these instructions at home: Medicines  Take over-the-counter and prescription medicines as told by your health care provider. Finish all antibiotic medicine even when you start to feel better. Stay up to date with all your vaccines. Get scheduled vaccines and boosters as recommended by your health care provider. Lifestyle Wear a high-quality mask if you must be around others at home and in public, if recommended. Improve air flow (ventilation) at home to help prevent the disease from spreading to other people, if possible. Do not share personal household items, like cups, towels, and utensils. Practice everyday hygiene and cleaning. General instructions Talk to your health care provider if you have a weakened body defense system (immune system). People with a weakened immune system may have a reduced immune response to vaccines. You may need to follow current prevention measures, including wearing a well-fitting mask, avoiding crowds, and avoiding poorly ventilated indoor places. Monitor symptoms and follow health care provider instructions, which may include resting, drinking fluids, and taking medicines. Follow specific isolation and quarantine recommendations if you are in places that can lead to disease outbreaks, such as correctional and detention facilities, homeless shelters, and cruise ships. Return to your normal activities as told by your health care provider. Ask your health care provider what activities are safe for you. Keep all follow-up visits. This is important. Where to find more information CDC: www.cdc.gov/quarantine/index.html Contact   a health care provider if: You have a fever. You have signs and symptoms that  return or get worse after isolation. Get help right away if: You have difficulty breathing. You have chest pain. These symptoms may be an emergency. Get help right away. Call 911. Do not wait to see if the symptoms will go away. Do not drive yourself to the hospital. Summary Isolation and quarantine help protect the public by preventing exposure to people who have or may have a contagious disease. Isolate when you are sick or when you test positive, even if you do not have symptoms. Quarantine and stay away from others when you have been in close contact with someone who has tested positive for a contagious disease. This information is not intended to replace advice given to you by your health care provider. Make sure you discuss any questions you have with your health care provider. Document Revised: 05/02/2021 Document Reviewed: 04/11/2021 Elsevier Patient Education  2023 Elsevier Inc.  

## 2022-01-13 NOTE — Progress Notes (Signed)
Subjective:   By signing my name below, I, Carylon Perches, attest that this documentation has been prepared under the direction and in the presence of [name] DO 01/13/2022   Patient ID: Tammy [name], female    [name]: [birth year], 61 y.o.   MRN: [id number]  No chief complaint on file.   HPI Patient is in today for an office visit  She complains of coughing up blood w/ mucus. Her symptoms begun on 01/10/2022. Symptoms included a sore throat, fever, and headache. She was admitted to the ED on 01/11/2022 and was diagnosed with Covid-19. She was not interested in receiving an anti-viral medication at the time of her admission. She was later discharged on 01/11/2022. She is now producing mucus w/ blood and has tightness in her chest. Blood Pressure has been stable but she has not been regularly checking her blood sugars.   She reports that she is responding well to the 2.5 Mg/0.5 mL of Mounjaro. She is interested in increasing the dosage.  Lab Results  Component Value Date   HGBA1C 6.1 02/15/2021   Wt Readings from Last 3 Encounters:  01/13/22 (!) 342 lb (155.1 kg)  01/11/22 (!) 335 lb (152 kg)  11/28/21 (!) 343 lb 6.4 oz (155.8 kg)    Past Medical History:  Diagnosis Date   Acute on chronic appendicitis s/p lap appy 06/10/2012 06/10/2012   surgery done 06-10-12   Arthritis    ankle   Back pain    Chest pain    Complication of anesthesia    Constipation    Diabetes mellitus    TYPE 2- no meds in several months-was taken off meds -monitoring blood sugar.   GERD (gastroesophageal reflux disease)    History of colon polyps    Hyperlipidemia    Hypertension    Joint pain    Lactose intolerance    Morbid obesity - BMI > 50 12/13/2012   Pneumonia    none recent   PONV (postoperative nausea and vomiting)    Poor venous access    "usually difficult stick"   Prediabetes    Sinusitis    SOB (shortness of breath)    Swelling    Vitamin D deficiency     Past Surgical  History:  Procedure Laterality Date   ABDOMINAL HYSTERECTOMY     APPENDECTOMY     2'14   COLONOSCOPY  04/11/11   5 mm polyp removed but not recovered   COLONOSCOPY WITH PROPOFOL N/A 05/11/2017   Procedure: COLONOSCOPY WITH PROPOFOL;  Surgeon: Gatha Mayer, MD;  Location: [facility];  Service: Endoscopy;  Laterality: N/A;   GASTRIC ROUX-EN-Y N/A 02/27/2015   Procedure: LAPAROSCOPIC ROUX-EN-Y GASTRIC BYPASS WITH UPPER ENDOSCOPY;  Surgeon: Johnathan Hausen, MD;  Location: [facility];  Service: General;  Laterality: N/A;   KNEE SURGERY Right     KNEE SURGERY    LAPAROSCOPIC APPENDECTOMY  06/10/2012   Procedure: APPENDECTOMY LAPAROSCOPIC;  Surgeon: Adin Hector, MD;  Location: [facility];  Service: General;  Laterality: N/A;   UPPER GASTROINTESTINAL ENDOSCOPY  04/14/2006   Normal    Family History  Problem Relation Age of Onset   Hyperlipidemia Mother    Hypertension Mother    Cancer Mother 83       hodgkins lymphoma   Diabetes Father    Heart failure Father    Heart disease Father    Hyperlipidemia Brother    Diabetes Paternal Grandmother    Breast cancer Paternal Aunt  unsure of age   Breast cancer Paternal Aunt    Heart disease Cousin    Colon cancer Neg Hx    Heart attack Neg Hx    Sudden death Neg Hx    Colon polyps Neg Hx    Esophageal cancer Neg Hx    Stomach cancer Neg Hx    Rectal cancer Neg Hx     Social History   Socioeconomic History   Marital status: Married    Spouse name: Publishing rights manager   Number of children: 0   Years of education: Not on file   Highest education level: Not on file  Occupational History   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[employer]  Tobacco Use   Smoking status: Never   Smokeless tobacco: Never  Vaping Use   Vaping Use: Never used  Substance and Sexual Activity   Alcohol use: No    Alcohol/week: 0.0 standard drinks of alcohol   Drug use: No   Sexual activity: Not on file  Other Topics Concern   Not on file  Social  History Narrative   Exercise- no   Social Determinants of Health   Financial Resource Strain: Not on file  Food Insecurity: Not on file  Transportation Needs: Not on file  Physical Activity: Not on file  Stress: Not on file  Social Connections: Not on file  Intimate Partner Violence: Not on file    Outpatient Medications Prior to Visit  Medication Sig Dispense Refill   amLODipine (NORVASC) 2.5 MG tablet Take 1 tablet (2.5 mg total) by mouth 2 (two) times daily. 180 tablet 3   carvedilol (COREG) 6.25 MG tablet TAKE 1 TABLET BY MOUTH  TWICE DAILY WITH MEALS 180 tablet 3   fluticasone (FLONASE) 50 MCG/ACT nasal spray SHAKE LIQUID AND USE 1 SPRAY IN EACH NOSTRIL IN THE MORNING AND AT BEDTIME 48 g 0   furosemide (LASIX) 40 MG tablet TAKE 1 TABLET BY MOUTH  DAILY 90 tablet 1   glucose blood test strip Check blood sugar twice daily 100 each 11   loratadine (CLARITIN) 10 MG tablet Take 1 tablet (10 mg total) by mouth daily. 30 tablet 11   meloxicam (MOBIC) 15 MG tablet Take 1 tablet daily with food for 14 days. Then take as needed. 30 tablet 1   Multiple Vitamins-Minerals (MULTIVITAMIN ADULT PO) Take 1 tablet by mouth daily. flintstone vitamins     nirmatrelvir/ritonavir EUA (PAXLOVID) 20 x 150 MG & 10 x '100MG'$  TABS Take 3 tablets by mouth 2 (two) times daily for 5 days. Patient GFR is 61. Take nirmatrelvir (150 mg) two tablets twice daily for 5 days and ritonavir (100 mg) one tablet twice daily for 5 days. 30 tablet 0   NONFORMULARY OR COMPOUNDED ITEM Thigh high compression socks  20-30 mmhg    Re- low ext edema 1 each 1   NONFORMULARY OR COMPOUNDED ITEM Compression socks 20-45m/hg  #1  as directed 1 each 1   pantoprazole (PROTONIX) 40 MG tablet Take 2 tablets (80 mg total) by mouth daily. 180 tablet 3   tirzepatide (MOUNJARO) 2.5 MG/0.5ML Pen Inject 2.5 mg into the skin once a week. 2 mL 1   tiZANidine (ZANAFLEX) 4 MG tablet Take 1 tablet (4 mg total) by mouth every 6 (six) hours as needed for  muscle spasms. 30 tablet 0   No facility-administered medications prior to visit.    Allergies  Allergen Reactions   Ace Inhibitors Anaphylaxis    Bowel  angioedema   Cheese Nausea And Vomiting   Red Dye Nausea And Vomiting   [name] Flash [Fish Allergy] Nausea And Vomiting    Review of Systems  Respiratory:  Positive for cough (W/ Blood and Mucus).   Cardiovascular:        (+) Tightness In Her Chest       Objective:    Physical Exam Constitutional:      General: She is not in acute distress.    Appearance: Normal appearance. She is not ill-appearing.  HENT:     Head: Normocephalic and atraumatic.     Right Ear: External ear normal.     Left Ear: External ear normal.  Eyes:     Extraocular Movements: Extraocular movements intact.     Pupils: Pupils are equal, round, and reactive to light.  Cardiovascular:     Rate and Rhythm: Normal rate and regular rhythm.     Heart sounds: Normal heart sounds. No murmur heard.    No gallop.  Pulmonary:     Effort: Pulmonary effort is normal. No respiratory distress.     Breath sounds: Wheezing present. No rales.  Skin:    General: Skin is warm and dry.  Neurological:     Mental Status: She is alert and oriented to person, place, and time.  Psychiatric:        Judgment: Judgment normal.     There were no vitals taken for this visit. Wt Readings from Last 3 Encounters:  01/11/22 (!) 335 lb (152 kg)  11/28/21 (!) 343 lb 6.4 oz (155.8 kg)  09/26/21 (!) 356 lb 9.6 oz (161.8 kg)    Diabetic Foot Exam - Simple   No data filed    Lab Results  Component Value Date   WBC 3.0 (L) 11/28/2021   HGB 12.2 11/28/2021   HCT 38.5 11/28/2021   PLT 243.0 11/28/2021   GLUCOSE 95 11/28/2021   CHOL 187 02/15/2021   TRIG 56.0 02/15/2021   HDL 71.90 02/15/2021   LDLDIRECT 129.1 10/17/2009   LDLCALC 104 (H) 02/15/2021   ALT 20 11/28/2021   AST 20 11/28/2021   NA 142 11/28/2021   K 4.2 11/28/2021   CL 105 11/28/2021   CREATININE 0.99  11/28/2021   BUN 14 11/28/2021   CO2 30 11/28/2021   TSH 2.26 02/15/2021   INR 1.1 (H) 10/16/2014   HGBA1C 6.1 02/15/2021   MICROALBUR <0.7 02/15/2021    Lab Results  Component Value Date   TSH 2.26 02/15/2021   Lab Results  Component Value Date   WBC 3.0 (L) 11/28/2021   HGB 12.2 11/28/2021   HCT 38.5 11/28/2021   MCV 87.7 11/28/2021   PLT 243.0 11/28/2021   Lab Results  Component Value Date   NA 142 11/28/2021   K 4.2 11/28/2021   CO2 30 11/28/2021   GLUCOSE 95 11/28/2021   BUN 14 11/28/2021   CREATININE 0.99 11/28/2021   BILITOT 0.6 11/28/2021   ALKPHOS 92 11/28/2021   AST 20 11/28/2021   ALT 20 11/28/2021   PROT 6.9 11/28/2021   ALBUMIN 3.9 11/28/2021   CALCIUM 8.6 11/28/2021   ANIONGAP 5 04/22/2018   GFR 61.84 11/28/2021   Lab Results  Component Value Date   CHOL 187 02/15/2021   Lab Results  Component Value Date   HDL 71.90 02/15/2021   Lab Results  Component Value Date   LDLCALC 104 (H) 02/15/2021   Lab Results  Component Value Date   TRIG 56.0 02/15/2021  Lab Results  Component Value Date   CHOLHDL 3 02/15/2021   Lab Results  Component Value Date   HGBA1C 6.1 02/15/2021       Assessment & Plan:   Problem List Items Addressed This Visit   None  No orders of the defined types were placed in this encounter.   I, Carylon Perches, personally preformed the services described in this documentation.  All medical record entries made by the scribe were at my direction and in my presence.  I have reviewed the chart and discharge instructions (if applicable) and agree that the record reflects my personal performance and is accurate and complete. 01/13/2022   I,Amber Collins,acting as a scribe for [employer], DO.,have documented all relevant documentation on the behalf of [name], DO,as directed by  [name], DO while in the presence of [name], DO.   [employer]

## 2022-01-13 NOTE — Telephone Encounter (Signed)
Patient evaluated in ER for COVID. Pt has appointment today with PCP.    [facility] [place] Night - Client TELEPHONE ADVICE RECORDAccessNurse Patient Name:Tammy HARGRAVEGender: Female  Initial Comment Caller states she had a sore throat and a fever yesterday and now she has a bad cough. [facility] Not Listed [facility] [place] urgent care Translation No Nurse Assessment Nurse: Ival Bible, RN, [place] Date/Time (Eastern Time): 01/11/2022 9:12:11 AM Confirm and document reason for call. If symptomatic, describe symptoms. ---Caller states she had a sore throat during the day yesterday and a fever at night with a bad cough. Now she has a really bad headache, sinus pain and congestion. Her temp was 101 overnight. She did not take any medicine, only drank liquid. Does the patient have any new or worsening symptoms? ---Yes Will a triage be completed? ---Yes Related visit to physician within the last 2 weeks? ---No Does the PT have any chronic conditions? (i.e. diabetes, asthma, this includes High risk factors for pregnancy, etc.) ---Yes List chronic conditions. ---HTN Is this a behavioral health or substance abuse call? ---No Guidelines Guideline Title Affirmed Question Affirmed Notes Nurse Date/Time (Eastern Time) Cough - Acute Productive [1] Fever > 101 F (38.3 C) AND [2] age > 42 years Dufrene, RN, J[place] 01/11/2022 9:18:13 AM Disp. Time Eilene Ghazi Time) Disposition Final User 01/11/2022 9:27:53 AM See HCP within 4 Hours (or PCP triage) Yes Dufrene, RN, [place] PLEASE NOTE: All timestamps contained within this report are represented as [place] Standard Time. CONFIDENTIALTY NOTICE: This fax transmission is intended only for the addressee. It contains information that is legally privileged, confidential or otherwise protected from use or disclosure. If you are not the intended recipient, you are strictly prohibited from reviewing, disclosing, copying using or disseminating any of  this information or taking any action in reliance on or regarding this information. If you have received this fax in error, please notify us immediately by telephone so that we can arrange for its return to [name]. Phone: [phone], Toll-Free: [phone], Fax: [phone] Page: 2 of 2 Call Id: [id number] Final Disposition 01/11/2022 9:27:53 AM See HCP within 4 Hours (or PCP triage) Yes Dufrene, RN, Antionette Poles Disagree/Comply Comply Caller Understands Yes PreDisposition Did not know what to do Care Advice Given Per Guideline SEE HCP (OR PCP TRIAGE) WITHIN 4 HOURS: * IF OFFICE WILL BE CLOSED AND NO PCP (PRIMARY CARE PROVIDER) SECOND-LEVEL TRIAGE: You need to be seen within the next 3 or 4 hours. A nearby Ur[facility]) is often a good source of care. Another choice is to go to the ED. Go sooner if you become worse. * UCC: Some UCCs can manage patients who are stable and have less serious symptoms (e.g., minor illnesses and injuries). The triager must know the [facility] capabilities before sending a patient there. If unsure, call ahead. FEVER MEDICINES: * ACETAMINOPHEN - REGULAR STRENGTH TYLENOL: Take 650 mg (two 325 mg pills) by mouth every 4 to 6 hours as needed. Each Regular Strength Tylenol pill has 325 mg of acetaminophen. The most you should take is 10 pills a day (3,250 mg total). Note: In [place], the maximum is 12 pills a day (3,900 mg total). CALL BACK IF: * You become worse CARE ADVICE given per Cough - Acute Productive (Adult) guideline. Comments User: Tery Sanfilippo, RN Date/Time Eilene Ghazi Time): 01/11/2022 9:23:28 AM Oral temp 102.1 Referrals GO TO FACILITY OTHER - SPECIFY

## 2022-01-14 ENCOUNTER — Encounter: Payer: Self-pay | Admitting: Family Medicine

## 2022-01-14 DIAGNOSIS — J40 Bronchitis, not specified as acute or chronic: Secondary | ICD-10-CM | POA: Insufficient documentation

## 2022-01-14 NOTE — Assessment & Plan Note (Signed)
Pt did not want to take the antiviral  pred taper  z pak  Cough med Call or rto if no improvement

## 2022-02-07 ENCOUNTER — Telehealth: Payer: Self-pay | Admitting: Family Medicine

## 2022-02-07 DIAGNOSIS — Z0279 Encounter for issue of other medical certificate: Secondary | ICD-10-CM

## 2022-02-07 NOTE — Telephone Encounter (Signed)
Forms faxed into front office Place in lowne bin up front

## 2022-02-10 NOTE — Telephone Encounter (Signed)
Will await til Dr. Ivy Lynn return  Called patient and advised her that Dr. Etter Sjogren is out of office til later this week and to call Shawna Orleans to get STD extended, since return date is 02/13/22.  Also asked what is paper work is for.  She stated for when she was out for COVID.  Patient was seen in ER on 01/11/22  She followed up with [name] on 01/13/22 and was kept out of work til 01/20/22.  Patient has been out of work from 01/11/22 thru 01/20/22.  I have printed out note for ER and our note to send and for your review.

## 2022-02-12 ENCOUNTER — Encounter: Payer: Self-pay | Admitting: *Deleted

## 2022-02-12 NOTE — Telephone Encounter (Signed)
Done

## 2022-02-19 NOTE — Telephone Encounter (Signed)
Forms were on my desk. Wasn't sure if they had been faxed so I faxed the forms to the provided number.

## 2022-03-05 ENCOUNTER — Encounter: Payer: Self-pay | Admitting: Podiatry

## 2022-03-05 ENCOUNTER — Other Ambulatory Visit: Payer: Self-pay | Admitting: Family Medicine

## 2022-03-05 ENCOUNTER — Ambulatory Visit (INDEPENDENT_AMBULATORY_CARE_PROVIDER_SITE_OTHER): Payer: 59 | Admitting: Podiatry

## 2022-03-05 ENCOUNTER — Encounter: Payer: Self-pay | Admitting: Family Medicine

## 2022-03-05 DIAGNOSIS — M76821 Posterior tibial tendinitis, right leg: Secondary | ICD-10-CM | POA: Diagnosis not present

## 2022-03-05 DIAGNOSIS — J302 Other seasonal allergic rhinitis: Secondary | ICD-10-CM

## 2022-03-05 MED ORDER — TRIAMCINOLONE ACETONIDE 10 MG/ML IJ SUSP
10.0000 mg | Freq: Once | INTRAMUSCULAR | Status: AC
  Administered 2022-03-05: 10 mg

## 2022-03-05 MED ORDER — FLUTICASONE PROPIONATE 50 MCG/ACT NA SUSP: 1.0000 | Freq: Two times a day (BID) | NASAL | 1 refills | Status: DC | PRN

## 2022-03-05 NOTE — Progress Notes (Signed)
Subjective:   Patient ID: Tammy [name], female   [name]: 61 y.o.   MRN: [id number]   HPI Patient presents stating the inside of the right ankle has started to bother me and it was mishandled by physical therapist and has become very sore.  Doing well on the outside    ROS      Objective:  Physical Exam  Neurovascular status intact muscle strength adequate with patient found to have severe lymphedema that she is just starting to treat with inflammation of the medial ankle with flatfoot deformity     Assessment:  Posterior tibial tendinitis right with inflammation     Plan:  Discussed condition standard prep today sheath injection after explaining risk 3 mg dexamethasone Kenalog 5 mg Xylocaine advised on supportive shoes reappoint as symptoms indicate

## 2022-04-03 ENCOUNTER — Ambulatory Visit (INDEPENDENT_AMBULATORY_CARE_PROVIDER_SITE_OTHER): Payer: 59 | Admitting: Family Medicine

## 2022-04-03 VITALS — BP 136/84 | HR 74 | Temp 98.2°F | Resp 18 | Ht 65.0 in | Wt 328.0 lb

## 2022-04-03 DIAGNOSIS — E1151 Type 2 diabetes mellitus with diabetic peripheral angiopathy without gangrene: Secondary | ICD-10-CM

## 2022-04-03 DIAGNOSIS — E43 Unspecified severe protein-calorie malnutrition: Secondary | ICD-10-CM | POA: Diagnosis not present

## 2022-04-03 DIAGNOSIS — I10 Essential (primary) hypertension: Secondary | ICD-10-CM

## 2022-04-03 DIAGNOSIS — E1165 Type 2 diabetes mellitus with hyperglycemia: Secondary | ICD-10-CM

## 2022-04-03 DIAGNOSIS — Z0000 Encounter for general adult medical examination without abnormal findings: Secondary | ICD-10-CM | POA: Diagnosis not present

## 2022-04-03 DIAGNOSIS — R079 Chest pain, unspecified: Secondary | ICD-10-CM

## 2022-04-03 DIAGNOSIS — E785 Hyperlipidemia, unspecified: Secondary | ICD-10-CM

## 2022-04-03 MED ORDER — CARVEDILOL 6.25 MG PO TABS: 6.2500 mg | ORAL_TABLET | Freq: Two times a day (BID) | ORAL | 3 refills | Status: DC

## 2022-04-03 MED ORDER — GLUCOSE BLOOD VI STRP: ORAL_STRIP | 11 refills | Status: AC

## 2022-04-03 MED ORDER — FUROSEMIDE 40 MG PO TABS: 40.0000 mg | ORAL_TABLET | Freq: Every day | ORAL | 1 refills | Status: DC

## 2022-04-03 NOTE — Patient Instructions (Signed)
Calorie Counting for Weight Loss Calories are units of energy. Your body needs a certain number of calories from food to keep going throughout the day. When you eat or drink more calories than your body needs, your body stores the extra calories mostly as fat. When you eat or drink fewer calories than your body needs, your body burns fat to get the energy it needs. Calorie counting means keeping track of how many calories you eat and drink each day. Calorie counting can be helpful if you need to lose weight. If you eat fewer calories than your body needs, you should lose weight. Ask your health care provider what a healthy weight is for you. For calorie counting to work, you will need to eat the right number of calories each day to lose a healthy amount of weight per week. A dietitian can help you figure out how many calories you need in a day and will suggest ways to reach your calorie goal. A healthy amount of weight to lose each week is usually 1-2 lb (0.5-0.9 kg). This usually means that your daily calorie intake should be reduced by 500-750 calories. Eating 1,200-1,500 calories a day can help most women lose weight. Eating 1,500-1,800 calories a day can help most men lose weight. What do I need to know about calorie counting? Work with your health care provider or dietitian to determine how many calories you should get each day. To meet your daily calorie goal, you will need to: Find out how many calories are in each food that you would like to eat. Try to do this before you eat. Decide how much of the food you plan to eat. Keep a food log. Do this by writing down what you ate and how many calories it had. To successfully lose weight, it is important to balance calorie counting with a healthy lifestyle that includes regular activity. Where do I find calorie information?  The number of calories in a food can be found on a Nutrition Facts label. If a food does not have a Nutrition Facts label, try  to look up the calories online or ask your dietitian for help. Remember that calories are listed per serving. If you choose to have more than one serving of a food, you will have to multiply the calories per serving by the number of servings you plan to eat. For example, the label on a package of bread might say that a serving size is 1 slice and that there are 90 calories in a serving. If you eat 1 slice, you will have eaten 90 calories. If you eat 2 slices, you will have eaten 180 calories. How do I keep a food log? After each time that you eat, record the following in your food log as soon as possible: What you ate. Be sure to include toppings, sauces, and other extras on the food. How much you ate. This can be measured in cups, ounces, or number of items. How many calories were in each food and drink. The total number of calories in the food you ate. Keep your food log near you, such as in a pocket-sized notebook or on an app or website on your mobile phone. Some programs will calculate calories for you and show you how many calories you have left to meet your daily goal. What are some portion-control tips? Know how many calories are in a serving. This will help you know how many servings you can have of a certain   food. Use a measuring cup to measure serving sizes. You could also try weighing out portions on a kitchen scale. With time, you will be able to estimate serving sizes for some foods. Take time to put servings of different foods on your favorite plates or in your favorite bowls and cups so you know what a serving looks like. Try not to eat straight from a food's packaging, such as from a bag or box. Eating straight from the package makes it hard to see how much you are eating and can lead to overeating. Put the amount you would like to eat in a cup or on a plate to make sure you are eating the right portion. Use smaller plates, glasses, and bowls for smaller portions and to prevent  overeating. Try not to multitask. For example, avoid watching TV or using your computer while eating. If it is time to eat, sit down at a table and enjoy your food. This will help you recognize when you are full. It will also help you be more mindful of what and how much you are eating. What are tips for following this plan? Reading food labels Check the calorie count compared with the serving size. The serving size may be smaller than what you are used to eating. Check the source of the calories. Try to choose foods that are high in protein, fiber, and vitamins, and low in saturated fat, trans fat, and sodium. Shopping Read nutrition labels while you shop. This will help you make healthy decisions about which foods to buy. Pay attention to nutrition labels for low-fat or fat-free foods. These foods sometimes have the same number of calories or more calories than the full-fat versions. They also often have added sugar, starch, or salt to make up for flavor that was removed with the fat. Make a grocery list of lower-calorie foods and stick to it. Cooking Try to cook your favorite foods in a healthier way. For example, try baking instead of frying. Use low-fat dairy products. Meal planning Use more fruits and vegetables. One-half of your plate should be fruits and vegetables. Include lean proteins, such as chicken, turkey, and fish. Lifestyle Each week, aim to do one of the following: 150 minutes of moderate exercise, such as walking. 75 minutes of vigorous exercise, such as running. General information Know how many calories are in the foods you eat most often. This will help you calculate calorie counts faster. Find a way of tracking calories that works for you. Get creative. Try different apps or programs if writing down calories does not work for you. What foods should I eat?  Eat nutritious foods. It is better to have a nutritious, high-calorie food, such as an avocado, than a food with  few nutrients, such as a bag of potato chips. Use your calories on foods and drinks that will fill you up and will not leave you hungry soon after eating. Examples of foods that fill you up are nuts and nut butters, vegetables, lean proteins, and high-fiber foods such as whole grains. High-fiber foods are foods with more than 5 g of fiber per serving. Pay attention to calories in drinks. Low-calorie drinks include water and unsweetened drinks. The items listed above may not be a complete list of foods and beverages you can eat. Contact a dietitian for more information. What foods should I limit? Limit foods or drinks that are not good sources of vitamins, minerals, or protein or that are high in unhealthy fats. These   include: Candy. Other sweets. Sodas, specialty coffee drinks, alcohol, and juice. The items listed above may not be a complete list of foods and beverages you should avoid. Contact a dietitian for more information. How do I count calories when eating out? Pay attention to portions. Often, portions are much larger when eating out. Try these tips to keep portions smaller: Consider sharing a meal instead of getting your own. If you get your own meal, eat only half of it. Before you start eating, ask for a container and put half of your meal into it. When available, consider ordering smaller portions from the menu instead of full portions. Pay attention to your food and drink choices. Knowing the way food is cooked and what is included with the meal can help you eat fewer calories. If calories are listed on the menu, choose the lower-calorie options. Choose dishes that include vegetables, fruits, whole grains, low-fat dairy products, and lean proteins. Choose items that are boiled, broiled, grilled, or steamed. Avoid items that are buttered, battered, fried, or served with cream sauce. Items labeled as crispy are usually fried, unless stated otherwise. Choose water, low-fat milk,  unsweetened iced tea, or other drinks without added sugar. If you want an alcoholic beverage, choose a lower-calorie option, such as a glass of wine or light beer. Ask for dressings, sauces, and syrups on the side. These are usually high in calories, so you should limit the amount you eat. If you want a salad, choose a garden salad and ask for grilled meats. Avoid extra toppings such as bacon, cheese, or fried items. Ask for the dressing on the side, or ask for olive oil and vinegar or lemon to use as dressing. Estimate how many servings of a food you are given. Knowing serving sizes will help you be aware of how much food you are eating at restaurants. Where to find more information Centers for Disease Control and Prevention: www.cdc.gov [place] Department of Agriculture: myplate.gov Summary Calorie counting means keeping track of how many calories you eat and drink each day. If you eat fewer calories than your body needs, you should lose weight. A healthy amount of weight to lose per week is usually 1-2 lb (0.5-0.9 kg). This usually means reducing your daily calorie intake by 500-750 calories. The number of calories in a food can be found on a Nutrition Facts label. If a food does not have a Nutrition Facts label, try to look up the calories online or ask your dietitian for help. Use smaller plates, glasses, and bowls for smaller portions and to prevent overeating. Use your calories on foods and drinks that will fill you up and not leave you hungry shortly after a meal. This information is not intended to replace advice given to you by your health care provider. Make sure you discuss any questions you have with your health care provider. Document Revised: 06/02/2019 Document Reviewed: 06/02/2019 [employer] Patient Education  2023 [employer]  

## 2022-04-03 NOTE — Progress Notes (Signed)
Subjective:   By signing my name below, I, [name], attest that this documentation has been prepared under the direction and in the presence of [name], DO. 04/03/2022    Patient ID: Tammy [name], female    [name]: 02-04-1961, 61 y.o.   MRN: [id number]  Chief Complaint  Patient presents with   Medication Management    Pt would like discuss Mounjaro and possible side effects     HPI Patient is in today for a office visit.   She is urinating less often. She is keeping up with her water intake but feels full easily after drinking it. She continues taking mounjaro regularly and thinks her symptoms may be related to taking it. She has lost 18 lb's since she started taking it.  Wt Readings from Last 3 Encounters:  04/03/22 (!) 328 lb (148.8 kg)  01/13/22 (!) 342 lb (155.1 kg)  01/11/22 (!) 335 lb (152 kg)   She also complains of frequent headaches. She thinks it is due to stress from her daily life and work.  She is requesting a refill for 6.25 mg carvedilol, Glucose blood strips, and 40 mg lasix.  She continues having aching left shoulder pain that goes down her arm. Her pain starts from under her left shoulder. She sleeps on her left side while sleeping. She has occasional aching left chest pain. Her symptoms resolve after belching. Her pain does not go across her chest of radiate around her back. Her pain worsens when pushing under her left shoulder. She is taking GasX and pantoprazole to manage her symptoms and reports finding mild relief while taking them. She has an upcomming endoscopy scheduled next month.    Past Medical History:  Diagnosis Date   Acute on chronic appendicitis s/p lap appy 06/10/2012 06/10/2012   surgery done 06-10-12   Arthritis    ankle   Back pain    Chest pain    Complication of anesthesia    Constipation    Diabetes mellitus    TYPE 2- no meds in several months-was taken off meds -monitoring blood sugar.   GERD (gastroesophageal reflux  disease)    History of colon polyps    Hyperlipidemia    Hypertension    Joint pain    Lactose intolerance    Morbid obesity - BMI > 50 12/13/2012   Pneumonia    none recent   PONV (postoperative nausea and vomiting)    Poor venous access    "usually difficult stick"   Prediabetes    Sinusitis    SOB (shortness of breath)    Swelling    Vitamin D deficiency     Past Surgical History:  Procedure Laterality Date   ABDOMINAL HYSTERECTOMY     APPENDECTOMY     2'14   COLONOSCOPY  04/11/11   5 mm polyp removed but not recovered   COLONOSCOPY WITH PROPOFOL N/A 05/11/2017   Procedure: COLONOSCOPY WITH PROPOFOL;  Surgeon: Gatha Mayer, MD;  Location: [facility];  Service: Endoscopy;  Laterality: N/A;   GASTRIC ROUX-EN-Y N/A 02/27/2015   Procedure: LAPAROSCOPIC ROUX-EN-Y GASTRIC BYPASS WITH UPPER ENDOSCOPY;  Surgeon: Johnathan Hausen, MD;  Location: [facility];  Service: General;  Laterality: N/A;   KNEE SURGERY Right     KNEE SURGERY    LAPAROSCOPIC APPENDECTOMY  06/10/2012   Procedure: APPENDECTOMY LAPAROSCOPIC;  Surgeon: Adin Hector, MD;  Location: [facility];  Service: General;  Laterality: N/A;   UPPER GASTROINTESTINAL ENDOSCOPY  04/14/2006   Normal    Family History  Problem Relation Age of Onset   Hyperlipidemia Mother    Hypertension Mother    Cancer Mother 76       hodgkins lymphoma   Diabetes Father    Heart failure Father    Heart disease Father    Hyperlipidemia Brother    Diabetes Paternal Grandmother    Breast cancer Paternal Aunt        unsure of age   Breast cancer Paternal Aunt    Heart disease Cousin    Colon cancer Neg Hx    Heart attack Neg Hx    Sudden death Neg Hx    Colon polyps Neg Hx    Esophageal cancer Neg Hx    Stomach cancer Neg Hx    Rectal cancer Neg Hx     Social History   Socioeconomic History   Marital status: Married    Spouse name: Publishing rights manager   Number of children: 0   Years of education: Not on file   Highest education level: Not on  file  Occupational History   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[employer]  Tobacco Use   Smoking status: Never   Smokeless tobacco: Never  Vaping Use   Vaping Use: Never used  Substance and Sexual Activity   Alcohol use: No    Alcohol/week: 0.0 standard drinks of alcohol   Drug use: No   Sexual activity: Not on file  Other Topics Concern   Not on file  Social History Narrative   Exercise- no   Social Determinants of Health   Financial Resource Strain: Not on file  Food Insecurity: Not on file  Transportation Needs: Not on file  Physical Activity: Not on file  Stress: Not on file  Social Connections: Not on file  Intimate Partner Violence: Not on file    Outpatient Medications Prior to Visit  Medication Sig Dispense Refill   amLODipine (NORVASC) 2.5 MG tablet Take 1 tablet (2.5 mg total) by mouth 2 (two) times daily. 180 tablet 3   fluticasone (FLONASE) 50 MCG/ACT nasal spray Place 1 spray into both nostrils 2 (two) times daily as needed for allergies or rhinitis. 48 g 1   loratadine (CLARITIN) 10 MG tablet Take 1 tablet (10 mg total) by mouth daily. 30 tablet 11   meloxicam (MOBIC) 15 MG tablet Take 1 tablet daily with food for 14 days. Then take as needed. 30 tablet 1   Multiple Vitamins-Minerals (MULTIVITAMIN ADULT PO) Take 1 tablet by mouth daily. flintstone vitamins     NONFORMULARY OR COMPOUNDED ITEM Thigh high compression socks  20-30 mmhg    Re- low ext edema 1 each 1   NONFORMULARY OR COMPOUNDED ITEM Compression socks 20-86m/hg  #1  as directed 1 each 1   pantoprazole (PROTONIX) 40 MG tablet Take 2 tablets (80 mg total) by mouth daily. 180 tablet 3   predniSONE (DELTASONE) 10 MG tablet TAKE 3 TABLETS PO QD FOR 3 DAYS THEN TAKE 2 TABLETS PO QD FOR 3 DAYS THEN TAKE 1 TABLET PO QD FOR 3 DAYS THEN TAKE 1/2 TAB PO QD FOR 3 DAYS 20 tablet 0   promethazine-dextromethorphan (PROMETHAZINE-DM) 6.25-15 MG/5ML syrup Take 5 mLs by mouth 4 (four) times daily as  needed. 118 mL 0   tirzepatide (MOUNJARO) 5 MG/0.5ML Pen Inject 5 mg into the skin once a week. 6 mL 1   tiZANidine (ZANAFLEX) 4 MG tablet Take 1 tablet (4  mg total) by mouth every 6 (six) hours as needed for muscle spasms. 30 tablet 0   carvedilol (COREG) 6.25 MG tablet TAKE 1 TABLET BY MOUTH  TWICE DAILY WITH MEALS 180 tablet 3   furosemide (LASIX) 40 MG tablet TAKE 1 TABLET BY MOUTH  DAILY 90 tablet 1   glucose blood test strip Check blood sugar twice daily 100 each 11   No facility-administered medications prior to visit.    Allergies  Allergen Reactions   Ace Inhibitors Anaphylaxis    Bowel angioedema   Cheese Nausea And Vomiting   Red Dye Nausea And Vomiting   Tuna [Fish Allergy] Nausea And Vomiting    Review of Systems  Constitutional:  Negative for chills, fever and malaise/fatigue.  HENT:  Negative for congestion and hearing loss.   Eyes:  Negative for discharge.  Respiratory:  Negative for cough, sputum production and shortness of breath.   Cardiovascular:  Positive for chest pain. Negative for palpitations and leg swelling.  Gastrointestinal:  Positive for heartburn. Negative for abdominal pain, blood in stool, constipation, diarrhea, nausea and vomiting.  Genitourinary:  Negative for dysuria, frequency, hematuria and urgency.       (+)urinating less   Musculoskeletal:  Negative for back pain, falls and myalgias.       (+)left shoulder pain  Skin:  Negative for rash.  Neurological:  Negative for dizziness, sensory change, loss of consciousness, weakness and headaches.  Endo/Heme/Allergies:  Negative for environmental allergies. Does not bruise/bleed easily.  Psychiatric/Behavioral:  Negative for depression and suicidal ideas. The patient is not nervous/anxious and does not have insomnia.        Objective:    Physical Exam Vitals and nursing note reviewed.  Constitutional:      General: She is not in acute distress.    Appearance: Normal appearance. She is  well-developed. She is not ill-appearing.  HENT:     Head: Normocephalic and atraumatic.     Right Ear: External ear normal.     Left Ear: External ear normal.  Eyes:     Extraocular Movements: Extraocular movements intact.     Conjunctiva/sclera: Conjunctivae normal.     Pupils: Pupils are equal, round, and reactive to light.  Neck:     Thyroid: No thyromegaly.     Vascular: No carotid bruit or JVD.  Cardiovascular:     Rate and Rhythm: Normal rate and regular rhythm.     Heart sounds: Normal heart sounds. No murmur heard.    No gallop.  Pulmonary:     Effort: Pulmonary effort is normal. No respiratory distress.     Breath sounds: Normal breath sounds. No wheezing or rales.  Chest:     Chest wall: No tenderness.  Musculoskeletal:     Left shoulder: Normal range of motion.     Cervical back: Normal range of motion and neck supple.  Skin:    General: Skin is warm and dry.  Neurological:     Mental Status: She is alert and oriented to person, place, and time.  Psychiatric:        Judgment: Judgment normal.     BP 136/84 (BP Location: Left Arm, Patient Position: Sitting, Cuff Size: Normal)   Pulse 74   Temp 98.2 F (36.8 C) (Oral)   Resp 18   Ht '5\' 5"'$  (1.651 m)   Wt (!) 328 lb (148.8 kg)   SpO2 97%   BMI 54.58 kg/m  Wt Readings from Last 3 Encounters:  04/03/22 Marland Kitchen)  328 lb (148.8 kg)  01/13/22 (!) 342 lb (155.1 kg)  01/11/22 (!) 335 lb (152 kg)    Diabetic Foot Exam - Simple   No data filed    Lab Results  Component Value Date   WBC 3.3 (L) 04/03/2022   HGB 13.0 04/03/2022   HCT 38.9 04/03/2022   PLT 276.0 04/03/2022   GLUCOSE 87 04/03/2022   CHOL 198 04/03/2022   TRIG 53.0 04/03/2022   HDL 75.50 04/03/2022   LDLDIRECT 129.1 10/17/2009   LDLCALC 112 (H) 04/03/2022   ALT 21 04/03/2022   AST 23 04/03/2022   NA 142 04/03/2022   K 3.7 04/03/2022   CL 106 04/03/2022   CREATININE 1.01 04/03/2022   BUN 15 04/03/2022   CO2 28 04/03/2022   TSH 1.81  04/03/2022   INR 1.1 (H) 10/16/2014   HGBA1C 5.9 04/03/2022   MICROALBUR 1.1 04/03/2022    Lab Results  Component Value Date   TSH 1.81 04/03/2022   Lab Results  Component Value Date   WBC 3.3 (L) 04/03/2022   HGB 13.0 04/03/2022   HCT 38.9 04/03/2022   MCV 87.7 04/03/2022   PLT 276.0 04/03/2022   Lab Results  Component Value Date   NA 142 04/03/2022   K 3.7 04/03/2022   CO2 28 04/03/2022   GLUCOSE 87 04/03/2022   BUN 15 04/03/2022   CREATININE 1.01 04/03/2022   BILITOT 0.4 04/03/2022   ALKPHOS 82 04/03/2022   AST 23 04/03/2022   ALT 21 04/03/2022   PROT 7.2 04/03/2022   ALBUMIN 4.1 04/03/2022   CALCIUM 8.7 04/03/2022   ANIONGAP 5 04/22/2018   GFR 60.23 04/03/2022   Lab Results  Component Value Date   CHOL 198 04/03/2022   Lab Results  Component Value Date   HDL 75.50 04/03/2022   Lab Results  Component Value Date   LDLCALC 112 (H) 04/03/2022   Lab Results  Component Value Date   TRIG 53.0 04/03/2022   Lab Results  Component Value Date   CHOLHDL 3 04/03/2022   Lab Results  Component Value Date   HGBA1C 5.9 04/03/2022       Assessment & Plan:   Problem List Items Addressed This Visit       Unprioritized   Essential hypertension   Relevant Medications   carvedilol (COREG) 6.25 MG tablet   furosemide (LASIX) 40 MG tablet   Other Relevant Orders   Comprehensive metabolic panel (Completed)   CBC with Differential/Platelet (Completed)   Microalbumin / creatinine urine ratio (Completed)   Edema due to malnutrition (HCC)   Relevant Medications   furosemide (LASIX) 40 MG tablet   DM (diabetes mellitus) type II, controlled, with peripheral vascular disorder (HCC)   Relevant Medications   carvedilol (COREG) 6.25 MG tablet   furosemide (LASIX) 40 MG tablet   glucose blood test strip   Other Visit Diagnoses     Type 2 diabetes mellitus with hyperglycemia, without long-term current use of insulin (HCC)    -  Primary   Relevant Orders    Comprehensive metabolic panel (Completed)   CBC with Differential/Platelet (Completed)   Microalbumin / creatinine urine ratio (Completed)   Hemoglobin A1c (Completed)   Hyperlipidemia, unspecified hyperlipidemia type       Relevant Medications   carvedilol (COREG) 6.25 MG tablet   furosemide (LASIX) 40 MG tablet   Other Relevant Orders   Comprehensive metabolic panel (Completed)   Lipid panel (Completed)   CBC with Differential/Platelet (Completed)  Health care maintenance       Relevant Orders   TSH (Completed)   CBC with Differential/Platelet (Completed)   Chest pain, unspecified type       Relevant Orders   EKG 12-Lead (Completed)   DG Chest 2 View   DG Shoulder Left        Meds ordered this encounter  Medications   carvedilol (COREG) 6.25 MG tablet    Sig: Take 1 tablet (6.25 mg total) by mouth 2 (two) times daily with a meal.    Dispense:  180 tablet    Refill:  3    Requesting 1 year supply   furosemide (LASIX) 40 MG tablet    Sig: Take 1 tablet (40 mg total) by mouth daily.    Dispense:  90 tablet    Refill:  1    Requesting 1 year supply   glucose blood test strip    Sig: Check blood sugar twice daily    Dispense:  100 each    Refill:  11    Onetouch ultra blue test strips    I, [name], DO, personally preformed the services described in this documentation.  All medical record entries made by the scribe were at my direction and in my presence.  I have reviewed the chart and discharge instructions (if applicable) and agree that the record reflects my personal performance and is accurate and complete. 04/03/2022   I,[name],acting as a scribe for [name], DO.,have documented all relevant documentation on the behalf of [name], DO,as directed by  [name], DO while in the presence of [name], DO.   [name], DO

## 2022-04-04 ENCOUNTER — Encounter: Payer: Self-pay | Admitting: Family Medicine

## 2022-04-04 LAB — CBC WITH DIFFERENTIAL/PLATELET
Basophils Absolute: 0.1 10*3/uL (ref 0.0–0.1)
Basophils Relative: 1.7 % (ref 0.0–3.0)
Eosinophils Absolute: 0.1 10*3/uL (ref 0.0–0.7)
Eosinophils Relative: 2.5 % (ref 0.0–5.0)
HCT: 38.9 % (ref 36.0–46.0)
Hemoglobin: 13 g/dL (ref 12.0–15.0)
Lymphocytes Relative: 35.2 % (ref 12.0–46.0)
Lymphs Abs: 1.2 10*3/uL (ref 0.7–4.0)
MCHC: 33.4 g/dL (ref 30.0–36.0)
MCV: 87.7 fl (ref 78.0–100.0)
Monocytes Absolute: 0.2 10*3/uL (ref 0.1–1.0)
Monocytes Relative: 6.8 % (ref 3.0–12.0)
Neutro Abs: 1.8 10*3/uL (ref 1.4–7.7)
Neutrophils Relative %: 53.8 % (ref 43.0–77.0)
Platelets: 276 10*3/uL (ref 150.0–400.0)
RBC: 4.43 Mil/uL (ref 3.87–5.11)
RDW: 15.3 % (ref 11.5–15.5)
WBC: 3.3 10*3/uL — ABNORMAL LOW (ref 4.0–10.5)

## 2022-04-04 LAB — LIPID PANEL
Cholesterol: 198 mg/dL (ref 0–200)
HDL: 75.5 mg/dL (ref >39.00)
LDL Cholesterol: 112 mg/dL — ABNORMAL HIGH (ref 0–99)
NonHDL: 122.24
Total CHOL/HDL Ratio: 3
Triglycerides: 53 mg/dL (ref 0.0–149.0)
VLDL: 10.6 mg/dL (ref 0.0–40.0)

## 2022-04-04 LAB — COMPREHENSIVE METABOLIC PANEL
ALT: 21 U/L (ref 0–35)
AST: 23 U/L (ref 0–37)
Albumin: 4.1 g/dL (ref 3.5–5.2)
Alkaline Phosphatase: 82 U/L (ref 39–117)
BUN: 15 mg/dL (ref 6–23)
CO2: 28 mEq/L (ref 19–32)
Calcium: 8.7 mg/dL (ref 8.4–10.5)
Chloride: 106 mEq/L (ref 96–112)
Creatinine, Ser: 1.01 mg/dL (ref 0.40–1.20)
GFR: 60.23 mL/min (ref >60.00)
Glucose, Bld: 87 mg/dL (ref 70–99)
Potassium: 3.7 mEq/L (ref 3.5–5.1)
Sodium: 142 mEq/L (ref 135–145)
Total Bilirubin: 0.4 mg/dL (ref 0.2–1.2)
Total Protein: 7.2 g/dL (ref 6.0–8.3)

## 2022-04-04 LAB — MICROALBUMIN / CREATININE URINE RATIO
Creatinine,U: 270.8 mg/dL
Microalb Creat Ratio: 0.4 mg/g (ref 0.0–30.0)
Microalb, Ur: 1.1 mg/dL (ref 0.0–1.9)

## 2022-04-04 LAB — TSH: TSH: 1.81 u[IU]/mL (ref 0.35–5.50)

## 2022-04-04 LAB — HEMOGLOBIN A1C: Hgb A1c MFr Bld: 5.9 % (ref 4.6–6.5)

## 2022-04-07 ENCOUNTER — Other Ambulatory Visit: Payer: Self-pay | Admitting: Family Medicine

## 2022-04-07 DIAGNOSIS — I10 Essential (primary) hypertension: Secondary | ICD-10-CM

## 2022-04-17 ENCOUNTER — Ambulatory Visit (INDEPENDENT_AMBULATORY_CARE_PROVIDER_SITE_OTHER): Payer: 59 | Admitting: Internal Medicine

## 2022-04-17 ENCOUNTER — Encounter: Payer: Self-pay | Admitting: Internal Medicine

## 2022-04-17 VITALS — BP 130/66 | HR 70 | Ht 66.0 in | Wt 330.0 lb

## 2022-04-17 DIAGNOSIS — R12 Heartburn: Secondary | ICD-10-CM | POA: Diagnosis not present

## 2022-04-17 DIAGNOSIS — R1013 Epigastric pain: Secondary | ICD-10-CM

## 2022-04-17 NOTE — Patient Instructions (Signed)
Per Dr Carlean Purl hold your next 2 doses of Mounjaro and then [facility] [name] an update.   Due to recent changes in healthcare laws, you may see the results of your imaging and laboratory studies on MyChart before your provider has had a chance to review them.  We understand that in some cases there may be results that are confusing or concerning to you. Not all laboratory results come back in the same time frame and the provider may be waiting for multiple results in order to interpret others.  Please give [name] 48 hours in order for your provider to thoroughly review all the results before contacting the office for clarification of your results.    I appreciate the opportunity to care for you. Silvano Rusk, MD

## 2022-04-17 NOTE — Progress Notes (Signed)
Tammy [name] 61 y.o. 1961-03-18 [id number]  Assessment & Plan:   Encounter Diagnoses  Name Primary?   Heartburn Yes   Abdominal pain, epigastric    It is quite possible these gastrointestinal symptoms are related to [facility].  GLP-1 agonists delay gastric emptying and contribute to symptoms like this.  Hold Mounjaro x 2 weeks.  She will notify me with an update regarding symptoms at that time through [place].  If she has persistent problems consider imaging versus lab testing versus endoscopy.  Any endoscopy would have to be done at the hospital due to BMI greater than 50.  CC: [name], DO  Subjective:    Chief Complaint: Reflux symptoms and upper abdominal pain  HPI 61 year old African-American woman with severe obesity BMI 53, GERD, history of intermittent persistent vomiting issues, poor venous access hypertension and type 2 diabetes, s/p roux-en-y gastric bypasspresents with complaints of  She has been on Mounjaro for several months, and is losing weight.  Seen by Dr. Cheri Rous April 03, 2022 complaining of headaches perhaps related to stress, aching shoulder pain and occasional aching left chest pain which resolved after belching.  Mild relief with Gas-X and pantoprazole.  A1c was 5.9% then.  It has been in a good range for years low sixes high fives.  In July she had a negative H. pylori antibody.  She had been complaining of reflux.  She is describing some burning left-sided chest pain off and on.  Dr. Cheri Rous told her to try some Tums and that has helped.  She has been on pantoprazole as well.  Darcel Bayley was started in September she thinks she had some of the symptoms prior as well.  It sounds like they are probably worse at this point.  Describes epigastric pain as a "stomachache".  There is no radiation is not nocturnal it does not awaken her.  Bowel movements are regular at this point.  She thinks "I am not burping enough".  She is not vomiting.  History of  chronic vomiting in 2007, negative EGD and small bowel follow-through, follow-up in 2014, recurrent symptoms, we attributed to stress. 2016 angioedema of small bowel from ACEI  Colonoscopy history 2012 diminutive polyp removed but not recovered, 2019 no polyps and otherwise normal recall 2029.  A1c has been in good range as above.  She is always concerned about things getting worse which is part of the reason she chose to pursue the [facility].  Wt Readings from Last 3 Encounters:  04/17/22 (!) 330 lb (149.7 kg)  04/03/22 (!) 328 lb (148.8 kg)  01/13/22 (!) 342 lb (155.1 kg)  Right upper quadrant ultrasound in 2020 was normal.  Allergies  Allergen Reactions   Ace Inhibitors Anaphylaxis    Bowel angioedema   Cheese Nausea And Vomiting   Red Dye Nausea And Vomiting   Tuna [Fish Allergy] Nausea And Vomiting   Current Meds  Medication Sig   amLODipine (NORVASC) 2.5 MG tablet Take 1 tablet (2.5 mg total) by mouth 2 (two) times daily.   carvedilol (COREG) 6.25 MG tablet TAKE 1 TABLET BY MOUTH  TWICE DAILY WITH MEALS   fluticasone (FLONASE) 50 MCG/ACT nasal spray Place 1 spray into both nostrils 2 (two) times daily as needed for allergies or rhinitis.   furosemide (LASIX) 40 MG tablet Take 1 tablet (40 mg total) by mouth daily.   glucose blood test strip Check blood sugar twice daily   loratadine (CLARITIN) 10 MG tablet Take 1 tablet (10 mg  total) by mouth daily.   Multiple Vitamins-Minerals (MULTIVITAMIN ADULT PO) Take 1 tablet by mouth daily. flintstone vitamins   NONFORMULARY OR COMPOUNDED ITEM Thigh high compression socks  20-30 mmhg    Re- low ext edema   NONFORMULARY OR COMPOUNDED ITEM Compression socks 20-47m/hg  #1  as directed   pantoprazole (PROTONIX) 40 MG tablet Take 2 tablets (80 mg total) by mouth daily.   tirzepatide ([facility] 5 MG/0.5ML Pen Inject 5 mg into the skin once a week.   Past Medical History:  Diagnosis Date   Acute on chronic appendicitis s/p lap appy 06/10/2012  06/10/2012   surgery done 06-10-12   Angioedema of intestine due to angiotensin converting enzyme inhibitor (ACE-I)    Arthritis    ankle   Back pain    Chest pain    Complication of anesthesia    Constipation    Diabetes mellitus    TYPE 2- no meds in several months-was taken off meds -monitoring blood sugar.   GERD (gastroesophageal reflux disease)    History of colon polyps    Hyperlipidemia    Hypertension    Joint pain    Lactose intolerance    Morbid obesity - BMI > 50 12/13/2012   Pneumonia    none recent   PONV (postoperative nausea and vomiting)    Poor venous access    "usually difficult stick"   Prediabetes    Sinusitis    SOB (shortness of breath)    Swelling    Vitamin D deficiency    Past Surgical History:  Procedure Laterality Date   ABDOMINAL HYSTERECTOMY     APPENDECTOMY     2'14   COLONOSCOPY  04/11/11   5 mm polyp removed but not recovered   COLONOSCOPY WITH PROPOFOL N/A 05/11/2017   Procedure: COLONOSCOPY WITH PROPOFOL;  Surgeon: GGatha Mayer MD;  Location: [facility];  Service: Endoscopy;  Laterality: N/A;   GASTRIC ROUX-EN-Y N/A 02/27/2015   Procedure: LAPAROSCOPIC ROUX-EN-Y GASTRIC BYPASS WITH UPPER ENDOSCOPY;  Surgeon: MJohnathan Hausen MD;  Location: [facility];  Service: General;  Laterality: N/A;   KNEE SURGERY Right     KNEE SURGERY    LAPAROSCOPIC APPENDECTOMY  06/10/2012   Procedure: APPENDECTOMY LAPAROSCOPIC;  Surgeon: SAdin Hector MD;  Location: [facility];  Service: General;  Laterality: N/A;   UPPER GASTROINTESTINAL ENDOSCOPY  04/14/2006   Normal   Social History   Social History Narrative   Exercise- no   family history includes Breast cancer in her paternal aunt and paternal aunt; Cancer (age of onset: 337 in her mother; Diabetes in her father and paternal grandmother; Heart disease in her cousin and father; Heart failure in her father; Hyperlipidemia in her brother and mother; Hypertension in her mother.   Review of Systems As per  HPI otherwise negative  Objective:   Physical Exam '@BP'$  130/66   Pulse 70   Ht '5\' 6"'$  (1.676 m)   Wt (!) 330 lb (149.7 kg)   BMI 53.26 kg/m @  General:  Well-developed, obese in no acute distress Eyes:  anicteric. Lungs: Clear to auscultation bilaterally. Heart:   S1S2, no rubs, murmurs, gallops. Abdomen:  soft, non-tender, no hepatosplenomegaly, hernia, or mass and BS+.   Lymph:  no cervical or supraclavicular adenopathy. Extremities:   Non-pitting edema, obese thighs. Neuro:  A&O x 3.  Psych:  appropriate mood and  Affect.   Data Reviewed: See HPI

## 2022-04-30 ENCOUNTER — Ambulatory Visit (INDEPENDENT_AMBULATORY_CARE_PROVIDER_SITE_OTHER): Payer: 59 | Admitting: Podiatry

## 2022-04-30 ENCOUNTER — Encounter: Payer: Self-pay | Admitting: Podiatry

## 2022-04-30 DIAGNOSIS — M76821 Posterior tibial tendinitis, right leg: Secondary | ICD-10-CM | POA: Diagnosis not present

## 2022-04-30 NOTE — Progress Notes (Signed)
Subjective:   Patient ID: Tammy [name], female   [name]: 61 y.o.   MRN: [id number]   HPI Patient presents stating she was having a lot of pain in her right ankle its gotten somewhat better recently that she is using a machine for her lymphedema   ROS      Objective:  Physical Exam  Ocular status intact with inflammation pain of the medial side of the right ankle with collapse of the arch with obesity and lymphedema is complicating factors     Assessment:  Posterior tibial tendinitis right secondary to structure and pressure     Plan:  H&P discussed shoe gear modifications anti-inflammatories do not recommend injection at this time but may be necessary in future continue orthotic usage also and continue to try to reduce the lymphedema

## 2022-05-13 ENCOUNTER — Encounter: Payer: Self-pay | Admitting: Family Medicine

## 2022-06-08 ENCOUNTER — Other Ambulatory Visit: Payer: Self-pay | Admitting: Family Medicine

## 2022-06-08 DIAGNOSIS — E43 Unspecified severe protein-calorie malnutrition: Secondary | ICD-10-CM

## 2022-06-21 ENCOUNTER — Other Ambulatory Visit: Payer: Self-pay | Admitting: Family Medicine

## 2022-06-21 DIAGNOSIS — E1165 Type 2 diabetes mellitus with hyperglycemia: Secondary | ICD-10-CM

## 2022-07-08 ENCOUNTER — Telehealth: Payer: Self-pay | Admitting: Family Medicine

## 2022-07-08 NOTE — Telephone Encounter (Signed)
Pt was called to determine if triage was needed. Pt returned call. Pt stated that her body has been aching for the last week or so and it's constant. She said that she feels as if it's Limited ROM on the left side as if she has a "catch" from her neck down to her knee. Thinks it's similar to arthritis but not sure. Pt said she would not describe it as stiff.  Pt said that both eyes were red for a week and then it was just the right eye but both have cleared up now.  Pt stated that the spots were bruises. She said that she has mentioned it to the doctor before but noticed them in other places. Pt said she has been to a dermatologist.   Patient was not sent to triage based on the information provided but sending to [place] to make sure she does not need to be seen earlier than 07/15/22. Pt understands someone may call her back if she should be seen sooner.

## 2022-07-09 NOTE — Telephone Encounter (Signed)
Spoke w/ Pt-she is not having weakness, states she stumbles a lot but this isn't new for her. Denies slurred speech, headaches, drooping on one side of face/body. Appt scheduled for tomorrow, 3/7 at 3:20pm.

## 2022-07-10 ENCOUNTER — Encounter: Payer: Self-pay | Admitting: Family Medicine

## 2022-07-10 ENCOUNTER — Ambulatory Visit (INDEPENDENT_AMBULATORY_CARE_PROVIDER_SITE_OTHER): Payer: 59 | Admitting: Family Medicine

## 2022-07-10 VITALS — BP 132/78 | HR 71 | Temp 98.2°F | Resp 18 | Ht 66.0 in | Wt 332.0 lb

## 2022-07-10 DIAGNOSIS — E1165 Type 2 diabetes mellitus with hyperglycemia: Secondary | ICD-10-CM | POA: Diagnosis not present

## 2022-07-10 DIAGNOSIS — M791 Myalgia, unspecified site: Secondary | ICD-10-CM | POA: Diagnosis not present

## 2022-07-10 DIAGNOSIS — I10 Essential (primary) hypertension: Secondary | ICD-10-CM

## 2022-07-10 DIAGNOSIS — E785 Hyperlipidemia, unspecified: Secondary | ICD-10-CM

## 2022-07-10 MED ORDER — CYCLOBENZAPRINE HCL 10 MG PO TABS: 10.0000 mg | ORAL_TABLET | Freq: Three times a day (TID) | ORAL | 0 refills | Status: DC | PRN

## 2022-07-10 MED ORDER — TIRZEPATIDE 7.5 MG/0.5ML ~~LOC~~ SOAJ: 7.5000 mg | SUBCUTANEOUS | 2 refills | Status: DC

## 2022-07-10 NOTE — Patient Instructions (Signed)
Cervical Strain and Sprain Rehab Ask your health care provider which exercises are safe for you. Do exercises exactly as told by your health care provider and adjust them as directed. It is normal to feel mild stretching, pulling, tightness, or discomfort as you do these exercises. Stop right away if you feel sudden pain or your pain gets worse. Do not begin these exercises until told by your health care provider. Stretching and range-of-motion exercises Cervical side bending  Using good posture, sit on a stable chair or stand up. Without moving your shoulders, slowly tilt your left / right ear to your shoulder until you feel a stretch in the neck muscles on the opposite side. You should be looking straight ahead. Hold for __________ seconds. Repeat with the other side of your neck. Repeat __________ times. Complete this exercise __________ times a day. Cervical rotation  Using good posture, sit on a stable chair or stand up. Slowly turn your head to the side as if you are looking over your left / right shoulder. Keep your eyes level with the ground. Stop when you feel a stretch along the side and the back of your neck. Hold for __________ seconds. Repeat this by turning to your other side. Repeat __________ times. Complete this exercise __________ times a day. Thoracic extension and pectoral stretch  Roll a towel or a small blanket so it is about 4 inches (10 cm) in diameter. Lie down on your back on a firm surface. Put the towel in the middle of your back across your spine. It should not be under your shoulder blades. Put your hands behind your head and let your elbows fall out to your sides. Hold for __________ seconds. Repeat __________ times. Complete this exercise __________ times a day. Strengthening exercises Upper cervical flexion  Lie on your back with a thin pillow behind your head or a small, rolled-up towel under your neck. Gently tuck your chin toward your chest and nod  your head down to look toward your feet. Do not lift your head off the pillow. Hold for __________ seconds. Release the tension slowly. Relax your neck muscles completely before you repeat this exercise. Repeat __________ times. Complete this exercise __________ times a day. Cervical extension  Stand about 6 inches (15 cm) away from a wall, with your back facing the wall. Place a soft object, about 6-8 inches (15-20 cm) in diameter, between the back of your head and the wall. A soft object could be a small pillow, a ball, or a folded towel. Gently tilt your head back and press into the soft object. Keep your jaw and forehead relaxed. Hold for __________ seconds. Release the tension slowly. Relax your neck muscles completely before you repeat this exercise. Repeat __________ times. Complete this exercise __________ times a day. Posture and body mechanics Body mechanics refer to the movements and positions of your body while you do your daily activities. Posture is part of body mechanics. Good posture and healthy body mechanics can help to relieve stress in your body's tissues and joints. Good posture means that your spine is in its natural S-curve position (your spine is neutral), your shoulders are pulled back slightly, and your head is not tipped forward. The following are general guidelines for using improved posture and body mechanics in your everyday activities. Sitting  When sitting, keep your spine neutral and keep your feet flat on the floor. Use a footrest, if needed, and keep your thighs parallel to the floor. Avoid rounding   your shoulders. Avoid tilting your head forward. When working at a desk or a computer, keep your desk at a height where your hands are slightly lower than your elbows. Slide your chair under your desk so you are close enough to maintain good posture. When working at a computer, place your monitor at a height where you are looking straight ahead and you do not have to  tilt your head forward or downward to look at the screen. Standing  When standing, keep your spine neutral and keep your feet about hip-width apart. Keep a slight bend in your knees. Your ears, shoulders, and hips should line up. When you do a task in which you stand in one place for a long time, place one foot up on a stable object that is 2-4 inches (5-10 cm) high, such as a footstool. This helps keep your spine neutral. Resting When lying down and resting, avoid positions that are most painful for you. Try to support your neck in a neutral position. You can use a contour pillow or a small rolled-up towel. Your pillow should support your neck but not push on it. This information is not intended to replace advice given to you by your health care provider. Make sure you discuss any questions you have with your health care provider. Document Revised: 11/11/2021 Document Reviewed: 11/11/2021 Elsevier Patient Education  2023 [employer] Inc[employer]  

## 2022-07-10 NOTE — Progress Notes (Signed)
Subjective:   By signing my name below, I, Tammy [name], attest that this documentation has been prepared under the direction and in the presence of Tammy Held, DO. 07/10/2022   Patient ID: Tammy [name], female    [name]: 10/31/1960, 62 y.o.   MRN: [id number]  Chief Complaint  Patient presents with   Generalized Body Aches    Pt states she was having left body aches and now states having soreness on the left side. No falls or injuries    HPI Patient is in today for an office visit.   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   GERD (gastroesophageal reflux disease)    History of colon polyps    Hyperlipidemia    Hypertension    Joint pain    Lactose intolerance    Morbid obesity - BMI > 50 12/13/2012   Pneumonia    none recent   PONV (postoperative nausea and vomiting)    Poor venous access    "usually difficult stick"   Prediabetes    Sinusitis    SOB (shortness of breath)    Swelling    Vitamin D deficiency     Past Surgical History:  Procedure Laterality Date   ABDOMINAL HYSTERECTOMY     APPENDECTOMY     2'14   COLONOSCOPY  04/11/11   5 mm polyp removed but not recovered   COLONOSCOPY WITH PROPOFOL N/A 05/11/2017   Procedure: COLONOSCOPY WITH PROPOFOL;  Surgeon: Gatha Mayer, MD;  Location: [facility];  Service: Endoscopy;  Laterality: N/A;   GASTRIC ROUX-EN-Y N/A 02/27/2015   Procedure: LAPAROSCOPIC ROUX-EN-Y GASTRIC BYPASS WITH UPPER ENDOSCOPY;  Surgeon: Johnathan Hausen, MD;  Location: [facility];  Service: General;  Laterality: N/A;   KNEE SURGERY Right     KNEE SURGERY    LAPAROSCOPIC  APPENDECTOMY  06/10/2012   Procedure: APPENDECTOMY LAPAROSCOPIC;  Surgeon: Adin Hector, MD;  Location: [facility];  Service: General;  Laterality: N/A;   UPPER GASTROINTESTINAL ENDOSCOPY  04/14/2006   Normal    Family History  Problem Relation Age of Onset   Hyperlipidemia Mother    Hypertension Mother    Cancer Mother 73       hodgkins lymphoma   Diabetes Father    Heart failure Father    Heart disease Father    Hyperlipidemia Brother    Diabetes Paternal Grandmother    Breast cancer Paternal Aunt        unsure of age   Breast cancer Paternal Aunt    Heart disease Cousin    Colon cancer Neg Hx    Heart attack Neg Hx    Sudden death Neg Hx    Colon polyps Neg Hx    Esophageal cancer Neg Hx    Stomach cancer Neg Hx    Rectal cancer Neg Hx     Social History   Socioeconomic History   Marital status: Married    Spouse name: Tammy [name]   Number of children: 0   Years of education: Not on file  Highest education level: Not on file  Occupational History   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    Employer: [employer]  Tobacco Use   Smoking status: Never   Smokeless tobacco: Never  Vaping Use   Vaping Use: Never used  Substance and Sexual Activity   Alcohol use: No    Alcohol/week: 0.0 standard drinks of alcohol   Drug use: No   Sexual activity: Not on file  Other Topics Concern   Not on file  Social History Narrative   Married, works at [employer]   No children   No alcohol tobacco or drug use   Social Determinants of Radio broadcast assistant Strain: Not on file  Food Insecurity: Not on file  Transportation Needs: Not on file  Physical Activity: Not on file  Stress: Not on file  Social Connections: Not on file  Intimate Partner Violence: Not on file    Outpatient Medications Prior to Visit  Medication Sig Dispense Refill   amLODipine (NORVASC) 2.5 MG tablet Take 1 tablet (2.5 mg total) by mouth 2 (two) times daily. 180 tablet 3   carvedilol (COREG) 6.25 MG  tablet TAKE 1 TABLET BY MOUTH  TWICE DAILY WITH MEALS 180 tablet 1   fluticasone (FLONASE) 50 MCG/ACT nasal spray Place 1 spray into both nostrils 2 (two) times daily as needed for allergies or rhinitis. 48 g 1   furosemide (LASIX) 40 MG tablet TAKE 1 TABLET BY MOUTH DAILY 90 tablet 1   glucose blood test strip Check blood sugar twice daily 100 each 11   loratadine (CLARITIN) 10 MG tablet Take 1 tablet (10 mg total) by mouth daily. 30 tablet 11   Multiple Vitamins-Minerals (MULTIVITAMIN ADULT PO) Take 1 tablet by mouth daily. flintstone vitamins     NONFORMULARY OR COMPOUNDED ITEM Thigh high compression socks  20-30 mmhg    Re- low ext edema 1 each 1   NONFORMULARY OR COMPOUNDED ITEM Compression socks 20-79m/hg  #1  as directed 1 each 1   pantoprazole (PROTONIX) 40 MG tablet Take 2 tablets (80 mg total) by mouth daily. 180 tablet 3   MOUNJARO 5 MG/0.5ML Pen INJECT 5 MG INTO THE SKIN ONCE A WEEK 6 mL 1   No facility-administered medications prior to visit.    Allergies  Allergen Reactions   Ace Inhibitors Anaphylaxis    Bowel angioedema   Cheese Nausea And Vomiting   Red Dye Nausea And Vomiting   Tuna [Fish Allergy] Nausea And Vomiting    Review of Systems  Constitutional:  Negative for fever and malaise/fatigue.  HENT:  Negative for congestion.   Eyes:  Negative for blurred vision.  Respiratory:  Negative for shortness of breath.   Cardiovascular:  Negative for chest pain, palpitations and leg swelling.  Gastrointestinal:  Negative for abdominal pain, blood in stool and nausea.  Genitourinary:  Negative for dysuria and frequency.  Musculoskeletal:  Positive for myalgias. Negative for falls.  Skin:  Negative for rash.  Neurological:  Negative for dizziness, loss of consciousness and headaches.  Endo/Heme/Allergies:  Negative for environmental allergies.  Psychiatric/Behavioral:  Negative for depression. The patient is not nervous/anxious.        Objective:    Physical  Exam Constitutional:      General: She is not in acute distress.    Appearance: Normal appearance.  HENT:     Head: Normocephalic and atraumatic.     Right Ear: External ear normal.     Left Ear: External ear  normal.  Eyes:     Extraocular Movements: Extraocular movements intact.     Pupils: Pupils are equal, round, and reactive to light.  Cardiovascular:     Rate and Rhythm: Normal rate and regular rhythm.     Heart sounds: Normal heart sounds. No murmur heard.    No gallop.  Pulmonary:     Effort: Pulmonary effort is normal. No respiratory distress.     Breath sounds: No wheezing or rales.  Abdominal:     Palpations: Abdomen is soft.     Tenderness: There is no abdominal tenderness. There is no guarding or rebound.  Musculoskeletal:        General: Tenderness present. Normal range of motion.     Comments: Muscle spasms and tenderness deltod and trap on L side   Skin:    General: Skin is warm.  Neurological:     General: No focal deficit present.     Mental Status: She is alert and oriented to person, place, and time.  Psychiatric:        Mood and Affect: Mood normal.     BP 132/78 (BP Location: Left Arm, Patient Position: Sitting, Cuff Size: Large)   Pulse 71   Temp 98.2 F (36.8 C) (Oral)   Resp 18   Ht '5\' 6"'$  (1.676 m)   Wt (!) 332 lb (150.6 kg)   SpO2 98%   BMI 53.59 kg/m  Wt Readings from Last 3 Encounters:  07/10/22 (!) 332 lb (150.6 kg)  04/17/22 (!) 330 lb (149.7 kg)  04/03/22 (!) 328 lb (148.8 kg)       Assessment & Plan:  Type 2 diabetes mellitus with hyperglycemia, without long-term current use of insulin (HCC) -     CBC with Differential/Platelet -     Comprehensive metabolic panel -     Hemoglobin A1c -     Lipid panel -     Tirzepatide; Inject 7.5 mg into the skin once a week.  Dispense: 6 mL; Refill: 2  Essential hypertension -     CBC with Differential/Platelet -     Comprehensive metabolic panel -     Hemoglobin A1c -     Lipid  panel  Hyperlipidemia, unspecified hyperlipidemia type -     CBC with Differential/Platelet -     Comprehensive metabolic panel -     Hemoglobin A1c -     Lipid panel  Myalgia -     VITAMIN D 25 Hydroxy (Vit-D Deficiency, Fractures) -     ANA -     Cyclobenzaprine HCl; Take 1 tablet (10 mg total) by mouth 3 (three) times daily as needed for muscle spasms.  Dispense: 30 tablet; Refill: 0    I, Tammy Held, DO, personally preformed the services described in this documentation.  All medical record entries made by the scribe were at my direction and in my presence.  I have reviewed the chart and discharge instructions (if applicable) and agree that the record reflects my personal performance and is accurate and complete. 07/10/2022  [place] as a scribe for Tammy Held, DO.,have documented all relevant documentation on the behalf of Tammy Held, DO,as directed by  Tammy Held, DO while in the presence of Tammy Held, DO.

## 2022-07-10 NOTE — Progress Notes (Signed)
Subjective:   By signing my name below, I, Tammy [name], attest that this documentation has been prepared under the direction and in the presence of Roma Schanz R, DO.  07/10/2022.   Patient ID: Tammy [name], female    [name]: Oct 12, 1960, 62 y.o.   MRN: [id number]  Chief Complaint  Patient presents with   Generalized Body Aches    Pt states she was having left body aches and now states having soreness on the left side. No falls or injuries    HPI Patient is in today for an office visit. Recently she underwent a 40 day fast. No longer fasting at this time.  Aching pains: Her main complaint today is left-sided aching pains especially in her left lower back, hip, and shoulder. Initially she was experiencing pain, but this has progressed to a soreness and aching described as "it just aches with movement". For a time she had limited ROM; this seems to have resolved as she demonstrates lifting her left arm over her head. However she still has some stiffness and soreness in her left shoulder. Her pain has not radiated distally to her legs. No change with palpation. No falls or rash. For pain management, she has tried using Motrin. Or Tylenol if her pain is very severe. Of note, she does sleep on her left side more frequently, although she is comfortable on either side. If she lies flat on her back she will snore.  Left knee pain: If she sits anywhere for a while, she has to massage her left knee before she can walk. Her knee issues have been ongoing for a while, prior to her aching pains.  Red eyes: For a few days she noticed redness in the whites of her eyes. Initially this was present in her right eye, but later progressed to her left eye as well. There was no associated pain in her eyes. She believes it may have been caused by the eye drops she used to treat dry eyes. Her symptoms started clearing up when she stopped using the drops, although she was also increasing her water intake at  the time.  Bruising: She complains of bruise-like areas of her mid-back. This was associated with significant pruritus until she switched to a different bra strap.  Immunizations: Per our records, she is not up to date on her influenza vaccination.  Denies having any fever, new moles, congestion, sinus pain, sore throat, chest pain, palpitations, cough, SOB, wheezing, n/v/d, constipation, blood in stool, dysuria, frequency, hematuria, at this time.  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   GERD (gastroesophageal reflux disease)    History of colon polyps    Hyperlipidemia    Hypertension    Joint pain    Lactose intolerance    Morbid obesity - BMI > 50 12/13/2012   Pneumonia    none recent   PONV (postoperative nausea and vomiting)    Poor venous access    "usually difficult stick"   Prediabetes    Sinusitis    SOB (shortness of breath)  Swelling    Vitamin D deficiency     Past Surgical History:  Procedure Laterality Date   ABDOMINAL HYSTERECTOMY     APPENDECTOMY     2'14   COLONOSCOPY  04/11/11   5 mm polyp removed but not recovered   COLONOSCOPY WITH PROPOFOL N/A 05/11/2017   Procedure: COLONOSCOPY WITH PROPOFOL;  Surgeon: Gatha Mayer, MD;  Location: [facility];  Service: Endoscopy;  Laterality: N/A;   GASTRIC ROUX-EN-Y N/A 02/27/2015   Procedure: LAPAROSCOPIC ROUX-EN-Y GASTRIC BYPASS WITH UPPER ENDOSCOPY;  Surgeon: Johnathan Hausen, MD;  Location: [facility];  Service: General;  Laterality: N/A;   KNEE SURGERY Right     KNEE SURGERY    LAPAROSCOPIC APPENDECTOMY  06/10/2012   Procedure: APPENDECTOMY  LAPAROSCOPIC;  Surgeon: Adin Hector, MD;  Location: [facility];  Service: General;  Laterality: N/A;   UPPER GASTROINTESTINAL ENDOSCOPY  04/14/2006   Normal    Family History  Problem Relation Age of Onset   Hyperlipidemia Mother    Hypertension Mother    Cancer Mother 22       hodgkins lymphoma   Diabetes Father    Heart failure Father    Heart disease Father    Hyperlipidemia Brother    Diabetes Paternal Grandmother    Breast cancer Paternal Aunt        unsure of age   Breast cancer Paternal Aunt    Heart disease Cousin    Colon cancer Neg Hx    Heart attack Neg Hx    Sudden death Neg Hx    Colon polyps Neg Hx    Esophageal cancer Neg Hx    Stomach cancer Neg Hx    Rectal cancer Neg Hx     Social History   Socioeconomic History   Marital status: Married    Spouse name: Publishing rights manager   Number of children: 0   Years of education: Not on file   Highest education level: Not on file  Occupational History   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[employer]  Tobacco Use   Smoking status: Never   Smokeless tobacco: Never  Vaping Use   Vaping Use: Never used  Substance and Sexual Activity   Alcohol use: No    Alcohol/week: 0.0 standard drinks of alcohol   Drug use: No   Sexual activity: Not on file  Other Topics Concern   Not on file  Social History Narrative   Married, works at [employer]   No children   No alcohol tobacco or drug use   Social Determinants of Radio broadcast assistant Strain: Not on file  Food Insecurity: Not on file  Transportation Needs: Not on file  Physical Activity: Not on file  Stress: Not on file  Social Connections: Not on file  Intimate Partner Violence: Not on file    Outpatient Medications Prior to Visit  Medication Sig Dispense Refill   amLODipine (NORVASC) 2.5 MG tablet Take 1 tablet (2.5 mg total) by mouth 2 (two) times daily. 180 tablet 3   carvedilol (COREG) 6.25 MG tablet TAKE 1 TABLET BY MOUTH  TWICE DAILY WITH  MEALS 180 tablet 1   fluticasone (FLONASE) 50 MCG/ACT nasal spray Place 1 spray into both nostrils 2 (two) times daily as needed for allergies or rhinitis. 48 g 1   furosemide (LASIX) 40 MG tablet TAKE 1 TABLET BY MOUTH DAILY 90 tablet 1   glucose blood test strip Check blood sugar twice daily 100 each  11   loratadine (CLARITIN) 10 MG tablet Take 1 tablet (10 mg total) by mouth daily. 30 tablet 11   Multiple Vitamins-Minerals (MULTIVITAMIN ADULT PO) Take 1 tablet by mouth daily. flintstone vitamins     NONFORMULARY OR COMPOUNDED ITEM Thigh high compression socks  20-30 mmhg    Re- low ext edema 1 each 1   NONFORMULARY OR COMPOUNDED ITEM Compression socks 20-1m/hg  #1  as directed 1 each 1   pantoprazole (PROTONIX) 40 MG tablet Take 2 tablets (80 mg total) by mouth daily. 180 tablet 3   MOUNJARO 5 MG/0.5ML Pen INJECT 5 MG INTO THE SKIN ONCE A WEEK 6 mL 1   No facility-administered medications prior to visit.    Allergies  Allergen Reactions   Ace Inhibitors Anaphylaxis    Bowel angioedema   Cheese Nausea And Vomiting   Red Dye Nausea And Vomiting   Tuna [Fish Allergy] Nausea And Vomiting    Review of Systems  Constitutional:  Negative for fever and malaise/fatigue.  HENT:  Negative for congestion, sinus pain and sore throat.   Eyes:  Negative for blurred vision.  Respiratory:  Negative for cough, shortness of breath and wheezing.   Cardiovascular:  Negative for chest pain, palpitations and leg swelling.  Gastrointestinal:  Negative for abdominal pain, blood in stool, constipation, diarrhea, nausea and vomiting.  Genitourinary:  Negative for dysuria, frequency and hematuria.  Musculoskeletal:  Positive for joint pain and myalgias. Negative for falls.  Skin:  Negative for rash.  Neurological:  Negative for dizziness, loss of consciousness and headaches.  Endo/Heme/Allergies:  Negative for environmental allergies.  Psychiatric/Behavioral:  Negative for depression. The patient is not  nervous/anxious.        Objective:    Physical Exam Vitals and nursing note reviewed.  Constitutional:      Appearance: Normal appearance.  HENT:     Head: Normocephalic and atraumatic.     Right Ear: Tympanic membrane, ear canal and external ear normal.     Left Ear: Tympanic membrane, ear canal and external ear normal.  Eyes:     Extraocular Movements: Extraocular movements intact.     Pupils: Pupils are equal, round, and reactive to light.  Cardiovascular:     Rate and Rhythm: Normal rate and regular rhythm.     Heart sounds: Normal heart sounds. No murmur heard.    No gallop.  Pulmonary:     Effort: Pulmonary effort is normal. No respiratory distress.     Breath sounds: Normal breath sounds. No wheezing or rales.  Skin:    General: Skin is warm and dry.     Comments: Small area of mid-back possibly bruising vs pigmented skin.  Neurological:     General: No focal deficit present.     Mental Status: She is alert and oriented to person, place, and time.  Psychiatric:        Mood and Affect: Mood normal.        Behavior: Behavior normal.     BP 132/78 (BP Location: Left Arm, Patient Position: Sitting, Cuff Size: Large)   Pulse 71   Temp 98.2 F (36.8 C) (Oral)   Resp 18   Ht '5\' 6"'$  (1.676 m)   Wt (!) 332 lb (150.6 kg)   SpO2 98%   BMI 53.59 kg/m  Wt Readings from Last 3 Encounters:  07/10/22 (!) 332 lb (150.6 kg)  04/17/22 (!) 330 lb (149.7 kg)  04/03/22 (!) 328 lb (148.8 kg)    Diabetic  Foot Exam - Simple   No data filed    Lab Results  Component Value Date   WBC 3.3 (L) 04/03/2022   HGB 13.0 04/03/2022   HCT 38.9 04/03/2022   PLT 276.0 04/03/2022   GLUCOSE 87 04/03/2022   CHOL 198 04/03/2022   TRIG 53.0 04/03/2022   HDL 75.50 04/03/2022   LDLDIRECT 129.1 10/17/2009   LDLCALC 112 (H) 04/03/2022   ALT 21 04/03/2022   AST 23 04/03/2022   NA 142 04/03/2022   K 3.7 04/03/2022   CL 106 04/03/2022   CREATININE 1.01 04/03/2022   BUN 15 04/03/2022    CO2 28 04/03/2022   TSH 1.81 04/03/2022   INR 1.1 (H) 10/16/2014   HGBA1C 5.9 04/03/2022   MICROALBUR 1.1 04/03/2022    Lab Results  Component Value Date   TSH 1.81 04/03/2022   Lab Results  Component Value Date   WBC 3.3 (L) 04/03/2022   HGB 13.0 04/03/2022   HCT 38.9 04/03/2022   MCV 87.7 04/03/2022   PLT 276.0 04/03/2022   Lab Results  Component Value Date   NA 142 04/03/2022   K 3.7 04/03/2022   CO2 28 04/03/2022   GLUCOSE 87 04/03/2022   BUN 15 04/03/2022   CREATININE 1.01 04/03/2022   BILITOT 0.4 04/03/2022   ALKPHOS 82 04/03/2022   AST 23 04/03/2022   ALT 21 04/03/2022   PROT 7.2 04/03/2022   ALBUMIN 4.1 04/03/2022   CALCIUM 8.7 04/03/2022   ANIONGAP 5 04/22/2018   GFR 60.23 04/03/2022   Lab Results  Component Value Date   CHOL 198 04/03/2022   Lab Results  Component Value Date   HDL 75.50 04/03/2022   Lab Results  Component Value Date   LDLCALC 112 (H) 04/03/2022   Lab Results  Component Value Date   TRIG 53.0 04/03/2022   Lab Results  Component Value Date   CHOLHDL 3 04/03/2022   Lab Results  Component Value Date   HGBA1C 5.9 04/03/2022       Assessment & Plan:   Problem List Items Addressed This Visit       Unprioritized   Essential hypertension   Relevant Orders   CBC with Differential/Platelet   Comprehensive metabolic panel   Hemoglobin A1c   Lipid panel   Other Visit Diagnoses     Type 2 diabetes mellitus with hyperglycemia, without long-term current use of insulin (HCC)    -  Primary   Relevant Medications   tirzepatide (MOUNJARO) 7.5 MG/0.5ML Pen   Other Relevant Orders   CBC with Differential/Platelet   Comprehensive metabolic panel   Hemoglobin A1c   Lipid panel   Hyperlipidemia, unspecified hyperlipidemia type       Relevant Orders   CBC with Differential/Platelet   Comprehensive metabolic panel   Hemoglobin A1c   Lipid panel   Myalgia       Relevant Medications   cyclobenzaprine (FLEXERIL) 10 MG tablet    Other Relevant Orders   VITAMIN D 25 Hydroxy (Vit-D Deficiency, Fractures)   Antinuclear Antib (ANA)        Meds ordered this encounter  Medications   tirzepatide (MOUNJARO) 7.5 MG/0.5ML Pen    Sig: Inject 7.5 mg into the skin once a week.    Dispense:  6 mL    Refill:  2   cyclobenzaprine (FLEXERIL) 10 MG tablet    Sig: Take 1 tablet (10 mg total) by mouth 3 (three) times daily as needed for muscle spasms.  Dispense:  30 tablet    Refill:  0    I, [name], DO, personally preformed the services described in this documentation.  All medical record entries made by the scribe were at my direction and in my presence.  I have reviewed the chart and discharge instructions (if applicable) and agree that the record reflects my personal performance and is accurate and complete.  07/10/2022.  I,Mathew Stumpf,acting as a Education administrator for [employer], DO.,have documented all relevant documentation on the behalf of [name], DO,as directed by  [name], DO while in the presence of [name], DO.   [name], DO

## 2022-07-11 LAB — COMPREHENSIVE METABOLIC PANEL
ALT: 19 U/L (ref 0–35)
AST: 22 U/L (ref 0–37)
Albumin: 3.5 g/dL (ref 3.5–5.2)
Alkaline Phosphatase: 79 U/L (ref 39–117)
BUN: 16 mg/dL (ref 6–23)
CO2: 28 mEq/L (ref 19–32)
Calcium: 8.9 mg/dL (ref 8.4–10.5)
Chloride: 108 mEq/L (ref 96–112)
Creatinine, Ser: 1.07 mg/dL (ref 0.40–1.20)
GFR: 56.09 mL/min — ABNORMAL LOW (ref >60.00)
Glucose, Bld: 103 mg/dL — ABNORMAL HIGH (ref 70–99)
Potassium: 3.9 mEq/L (ref 3.5–5.1)
Sodium: 143 mEq/L (ref 135–145)
Total Bilirubin: 0.4 mg/dL (ref 0.2–1.2)
Total Protein: 6.5 g/dL (ref 6.0–8.3)

## 2022-07-11 LAB — CBC WITH DIFFERENTIAL/PLATELET
Basophils Absolute: 0 10*3/uL (ref 0.0–0.1)
Basophils Relative: 0.8 % (ref 0.0–3.0)
Eosinophils Absolute: 0.1 10*3/uL (ref 0.0–0.7)
Eosinophils Relative: 3.7 % (ref 0.0–5.0)
HCT: 37.6 % (ref 36.0–46.0)
Hemoglobin: 12.3 g/dL (ref 12.0–15.0)
Lymphocytes Relative: 39.8 % (ref 12.0–46.0)
Lymphs Abs: 1.3 10*3/uL (ref 0.7–4.0)
MCHC: 32.6 g/dL (ref 30.0–36.0)
MCV: 86.9 fl (ref 78.0–100.0)
Monocytes Absolute: 0.2 10*3/uL (ref 0.1–1.0)
Monocytes Relative: 7 % (ref 3.0–12.0)
Neutro Abs: 1.6 10*3/uL (ref 1.4–7.7)
Neutrophils Relative %: 48.7 % (ref 43.0–77.0)
Platelets: 236 10*3/uL (ref 150.0–400.0)
RBC: 4.33 Mil/uL (ref 3.87–5.11)
RDW: 14.8 % (ref 11.5–15.5)
WBC: 3.4 10*3/uL — ABNORMAL LOW (ref 4.0–10.5)

## 2022-07-11 LAB — LIPID PANEL
Cholesterol: 180 mg/dL (ref 0–200)
HDL: 61 mg/dL (ref >39.00)
LDL Cholesterol: 107 mg/dL — ABNORMAL HIGH (ref 0–99)
NonHDL: 118.7
Total CHOL/HDL Ratio: 3
Triglycerides: 58 mg/dL (ref 0.0–149.0)
VLDL: 11.6 mg/dL (ref 0.0–40.0)

## 2022-07-11 LAB — HEMOGLOBIN A1C: Hgb A1c MFr Bld: 6 % (ref 4.6–6.5)

## 2022-07-12 LAB — ANTI-NUCLEAR AB-TITER (ANA TITER): ANA Titer 1: 1:40 {titer} — ABNORMAL HIGH

## 2022-07-12 LAB — VITAMIN D 25 HYDROXY (VIT D DEFICIENCY, FRACTURES): VITD: 10.21 ng/mL — ABNORMAL LOW (ref 30.00–100.00)

## 2022-07-12 LAB — ANA: Anti Nuclear Antibody (ANA): POSITIVE — AB

## 2022-07-15 ENCOUNTER — Other Ambulatory Visit: Payer: Self-pay

## 2022-07-15 ENCOUNTER — Ambulatory Visit: Payer: 59 | Admitting: Family Medicine

## 2022-07-15 MED ORDER — VITAMIN D (ERGOCALCIFEROL) 1.25 MG (50000 UNIT) PO CAPS: 50000.0000 [IU] | ORAL_CAPSULE | ORAL | 1 refills | Status: DC

## 2022-08-14 ENCOUNTER — Ambulatory Visit (INDEPENDENT_AMBULATORY_CARE_PROVIDER_SITE_OTHER): Payer: 59 | Admitting: Family Medicine

## 2022-08-14 ENCOUNTER — Encounter: Payer: Self-pay | Admitting: Family Medicine

## 2022-08-14 ENCOUNTER — Ambulatory Visit (HOSPITAL_COMMUNITY): Admission: RE | Admit: 2022-08-14 | Payer: 59 | Source: Ambulatory Visit

## 2022-08-14 VITALS — BP 138/80 | HR 69 | Temp 98.1°F | Resp 18 | Ht 66.0 in | Wt 337.0 lb

## 2022-08-14 DIAGNOSIS — F40240 Claustrophobia: Secondary | ICD-10-CM

## 2022-08-14 DIAGNOSIS — I10 Essential (primary) hypertension: Secondary | ICD-10-CM

## 2022-08-14 DIAGNOSIS — M791 Myalgia, unspecified site: Secondary | ICD-10-CM

## 2022-08-14 DIAGNOSIS — M545 Low back pain, unspecified: Secondary | ICD-10-CM | POA: Diagnosis not present

## 2022-08-14 DIAGNOSIS — K219 Gastro-esophageal reflux disease without esophagitis: Secondary | ICD-10-CM

## 2022-08-14 DIAGNOSIS — I890 Lymphedema, not elsewhere classified: Secondary | ICD-10-CM

## 2022-08-14 MED ORDER — CYCLOBENZAPRINE HCL 10 MG PO TABS
10.0000 mg | ORAL_TABLET | Freq: Three times a day (TID) | ORAL | 0 refills | Status: DC | PRN
Start: 2022-08-14 — End: 2023-08-06

## 2022-08-14 MED ORDER — MELOXICAM 15 MG PO TABS: ORAL_TABLET | ORAL | 2 refills | Status: DC

## 2022-08-14 MED ORDER — DIAZEPAM 5 MG PO TABS
ORAL_TABLET | ORAL | 0 refills | Status: DC
Start: 2022-08-14 — End: 2023-02-09

## 2022-08-14 MED ORDER — NONFORMULARY OR COMPOUNDED ITEM
1 refills | Status: DC
Start: 2022-08-14 — End: 2023-08-06

## 2022-08-14 MED ORDER — PANTOPRAZOLE SODIUM 40 MG PO TBEC: 80.0000 mg | DELAYED_RELEASE_TABLET | Freq: Every day | ORAL | 3 refills | Status: DC

## 2022-08-14 MED ORDER — CARVEDILOL 6.25 MG PO TABS: 6.2500 mg | ORAL_TABLET | Freq: Two times a day (BID) | ORAL | 1 refills | Status: DC

## 2022-08-14 NOTE — Assessment & Plan Note (Signed)
Worsening pain and weakness over the last 6-12 months  Mri ordered Mobic and flexeril  Return to office as needed

## 2022-08-14 NOTE — Progress Notes (Addendum)
Subjective:   By signing my name below, I, [name], attest that this documentation has been prepared under the direction and in the presence of Donato Schultz, DO. 08/14/2022   Patient ID: Tammy [name], female    [name]: 1960/06/16, 62 y.o.   MRN: [id number]  Chief Complaint  Patient presents with   Back Pain    Lower back pain, pt states pain has been going on for some time and states affecting her legs. She feels off balance.      Back Pain Associated symptoms include weakness. Pertinent negatives include no chest pain, fever or headaches.   Patient is in today for a office visit.   She complains her back pain has worsened over the past year. Her pain radiates down to her legs into her knees and it comes and goes. She also feels off balance. She is taking muscle relaxer's to help manage her pain and finds no change in her pain. She completed an X-ray last year and found arthritis in her back. She also seen a sports medicine specialist and found her knees may also be causing her problems. She has never had an MRI on her back. She is apprehensive to receive one due to claustrophobia.  She is requesting a refill for 6.25 mg Carvedilol. 10 mg flexeril, 15 mg valium, 40 mg Protonix, compression stockings.   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   GERD (gastroesophageal reflux disease)    History of colon polyps    Hyperlipidemia    Hypertension    Joint pain    Lactose intolerance    Morbid obesity - BMI > 50 12/13/2012   Pneumonia    none recent   PONV (postoperative nausea and vomiting)    Poor venous  access    "usually difficult stick"   Prediabetes    Sinusitis    SOB (shortness of breath)    Swelling    Vitamin D deficiency     Past Surgical History:  Procedure Laterality Date   ABDOMINAL HYSTERECTOMY     APPENDECTOMY     2'14   COLONOSCOPY  04/11/11   5 mm polyp removed but not recovered   COLONOSCOPY WITH PROPOFOL N/A 05/11/2017   Procedure: COLONOSCOPY WITH PROPOFOL;  Surgeon: Iva Boop, MD;  Location: [facility];  Service: Endoscopy;  Laterality: N/A;   GASTRIC ROUX-EN-Y N/A 02/27/2015   Procedure: LAPAROSCOPIC ROUX-EN-Y GASTRIC BYPASS WITH UPPER ENDOSCOPY;  Surgeon: Luretha Murphy, MD;  Location: [facility];  Service: General;  Laterality: N/A;   KNEE SURGERY Right     KNEE SURGERY    LAPAROSCOPIC APPENDECTOMY  06/10/2012   Procedure: APPENDECTOMY LAPAROSCOPIC;  Surgeon: Ardeth Sportsman, MD;  Location: [facility];  Service: General;  Laterality: N/A;   UPPER GASTROINTESTINAL ENDOSCOPY  04/14/2006   Normal    Family History  Problem Relation Age of Onset   Hyperlipidemia Mother    Hypertension Mother    Cancer Mother 44       hodgkins lymphoma   Diabetes Father  Heart failure Father    Heart disease Father    Hyperlipidemia Brother    Diabetes Paternal Grandmother    Breast cancer Paternal Aunt        unsure of age   Breast cancer Paternal Aunt    Heart disease Cousin    Colon cancer Neg Hx    Heart attack Neg Hx    Sudden death Neg Hx    Colon polyps Neg Hx    Esophageal cancer Neg Hx    Stomach cancer Neg Hx    Rectal cancer Neg Hx     Social History   Socioeconomic History   Marital status: Married    Spouse name: Engineer, civil (consulting)   Number of children: 0   Years of education: Not on file   Highest education level: Not on file  Occupational History   Occupation: FAB Set designer: [employer]  Tobacco Use   Smoking status: Never   Smokeless tobacco: Never  Vaping Use   Vaping Use: Never used  Substance and Sexual Activity    Alcohol use: No    Alcohol/week: 0.0 standard drinks of alcohol   Drug use: No   Sexual activity: Not on file  Other Topics Concern   Not on file  Social History Narrative   Married, works at [employer]   No children   No alcohol tobacco or drug use   Social Determinants of Corporate investment banker Strain: Not on file  Food Insecurity: Not on file  Transportation Needs: Not on file  Physical Activity: Not on file  Stress: Not on file  Social Connections: Not on file  Intimate Partner Violence: Not on file    Outpatient Medications Prior to Visit  Medication Sig Dispense Refill   amLODipine (NORVASC) 2.5 MG tablet Take 1 tablet (2.5 mg total) by mouth 2 (two) times daily. 180 tablet 3   fluticasone (FLONASE) 50 MCG/ACT nasal spray Place 1 spray into both nostrils 2 (two) times daily as needed for allergies or rhinitis. 48 g 1   furosemide (LASIX) 40 MG tablet TAKE 1 TABLET BY MOUTH DAILY 90 tablet 1   glucose blood test strip Check blood sugar twice daily 100 each 11   loratadine (CLARITIN) 10 MG tablet Take 1 tablet (10 mg total) by mouth daily. 30 tablet 11   Multiple Vitamins-Minerals (MULTIVITAMIN ADULT PO) Take 1 tablet by mouth daily. flintstone vitamins     NONFORMULARY OR COMPOUNDED ITEM Thigh high compression socks  20-30 mmhg    Re- low ext edema 1 each 1   NONFORMULARY OR COMPOUNDED ITEM Compression socks 20-9mm/hg  #1  as directed 1 each 1   tirzepatide (MOUNJARO) 7.5 MG/0.5ML Pen Inject 7.5 mg into the skin once a week. 6 mL 2   Vitamin D, Ergocalciferol, (DRISDOL) 1.25 MG (50000 UNIT) CAPS capsule Take 1 capsule (50,000 Units total) by mouth every 7 (seven) days. 12 capsule 1   carvedilol (COREG) 6.25 MG tablet TAKE 1 TABLET BY MOUTH  TWICE DAILY WITH MEALS 180 tablet 1   cyclobenzaprine (FLEXERIL) 10 MG tablet Take 1 tablet (10 mg total) by mouth 3 (three) times daily as needed for muscle spasms. 30 tablet 0   pantoprazole (PROTONIX) 40 MG tablet Take 2 tablets (80  mg total) by mouth daily. 180 tablet 3   No facility-administered medications prior to visit.    Allergies  Allergen Reactions   Ace Inhibitors Anaphylaxis    Bowel angioedema  Cheese Nausea And Vomiting   Red Dye Nausea And Vomiting   [place] Gum [Fish Allergy] Nausea And Vomiting    Review of Systems  Constitutional:  Negative for fever and malaise/fatigue.  HENT:  Negative for congestion.   Eyes:  Negative for blurred vision.  Respiratory:  Negative for cough and shortness of breath.   Cardiovascular:  Negative for chest pain, palpitations and leg swelling.  Gastrointestinal:  Negative for vomiting.  Musculoskeletal:  Positive for back pain. Negative for falls.       (+)bilateral knee pain   Skin:  Negative for rash.  Neurological:  Positive for weakness. Negative for loss of consciousness and headaches.       Objective:    Physical Exam Vitals and nursing note reviewed.  Constitutional:      General: She is not in acute distress.    Appearance: Normal appearance. She is not ill-appearing.  HENT:     Head: Normocephalic and atraumatic.     Right Ear: External ear normal.     Left Ear: External ear normal.  Eyes:     Extraocular Movements: Extraocular movements intact.     Pupils: Pupils are equal, round, and reactive to light.  Cardiovascular:     Rate and Rhythm: Normal rate and regular rhythm.     Heart sounds: Normal heart sounds. No murmur heard.    No gallop.  Pulmonary:     Effort: Pulmonary effort is normal. No respiratory distress.     Breath sounds: Normal breath sounds. No wheezing or rales.  Skin:    General: Skin is warm and dry.  Neurological:     General: No focal deficit present.     Mental Status: She is alert and oriented to person, place, and time.     Motor: Weakness present. No tremor.     Gait: Gait abnormal.     Deep Tendon Reflexes: Reflexes normal.     Comments: Hip flexor weak on Left 3/5 3/5 strength L leg extension  Psychiatric:         Judgment: Judgment normal.     BP 138/80 (BP Location: Right Arm, Patient Position: Sitting, Cuff Size: Large)   Pulse 69   Temp 98.1 F (36.7 C) (Oral)   Resp 18   Ht 5\' 6"  (1.676 m)   Wt (!) 337 lb (152.9 kg)   SpO2 97%   BMI 54.39 kg/m  Wt Readings from Last 3 Encounters:  08/14/22 (!) 337 lb (152.9 kg)  07/10/22 (!) 332 lb (150.6 kg)  04/17/22 (!) 330 lb (149.7 kg)       Assessment & Plan:  Low back pain with radiation -     Meloxicam; 1/2-1 po qd as needed  Dispense: 30 tablet; Refill: 2 -     MR LUMBAR SPINE WO CONTRAST; Future  Essential hypertension -     Carvedilol; Take 1 tablet (6.25 mg total) by mouth 2 (two) times daily with a meal.  Dispense: 180 tablet; Refill: 1  Gastroesophageal reflux disease -     Pantoprazole Sodium; Take 2 tablets (80 mg total) by mouth daily.  Dispense: 180 tablet; Refill: 3  Claustrophobia -     diazePAM; 1 po 30 min prior to procedure  Dispense: 30 tablet; Refill: 0  Myalgia -     Cyclobenzaprine HCl; Take 1 tablet (10 mg total) by mouth 3 (three) times daily as needed for muscle spasms.  Dispense: 30 tablet; Refill: 0  Lymphedema -  NONFORMULARY OR COMPOUNDED ITEM; Compression stockings #1  20-30 mm hg  Dispense: 1 each; Refill: 1    I, Donato SchultzYvonne R Lowne Chase, DO, personally preformed the services described in this documentation.  All medical record entries made by the scribe were at my direction and in my presence.  I have reviewed the chart and discharge instructions (if applicable) and agree that the record reflects my personal performance and is accurate and complete. 08/14/2022   I,[name],acting as a scribe for Donato SchultzYvonne R Lowne Chase, DO.,have documented all relevant documentation on the behalf of Donato SchultzYvonne R Lowne Chase, DO,as directed by  Donato SchultzYvonne R Lowne Chase, DO while in the presence of Donato SchultzYvonne R Lowne Chase, DO.   Donato SchultzYvonne R Lowne Chase, DO

## 2022-08-14 NOTE — Patient Instructions (Signed)

## 2022-08-15 ENCOUNTER — Ambulatory Visit (HOSPITAL_COMMUNITY)
Admission: RE | Admit: 2022-08-15 | Discharge: 2022-08-15 | Disposition: A | Discharge status: Home / Self Care | Payer: 59 | Source: Ambulatory Visit | Attending: Family Medicine | Admitting: Family Medicine

## 2022-08-15 DIAGNOSIS — M545 Low back pain, unspecified: Secondary | ICD-10-CM | POA: Insufficient documentation

## 2022-08-18 ENCOUNTER — Other Ambulatory Visit: Payer: Self-pay

## 2022-08-18 ENCOUNTER — Encounter: Payer: Self-pay | Admitting: Family Medicine

## 2022-08-18 DIAGNOSIS — M545 Low back pain, unspecified: Secondary | ICD-10-CM

## 2022-10-06 ENCOUNTER — Other Ambulatory Visit: Payer: Self-pay | Admitting: Family Medicine

## 2022-10-06 DIAGNOSIS — Z1231 Encounter for screening mammogram for malignant neoplasm of breast: Secondary | ICD-10-CM

## 2022-10-24 ENCOUNTER — Ambulatory Visit
Admission: RE | Admit: 2022-10-24 | Discharge: 2022-10-24 | Disposition: A | Discharge status: Home / Self Care | Payer: 59 | Source: Ambulatory Visit | Attending: Family Medicine | Admitting: Family Medicine

## 2022-10-24 DIAGNOSIS — Z1231 Encounter for screening mammogram for malignant neoplasm of breast: Secondary | ICD-10-CM

## 2022-12-19 ENCOUNTER — Ambulatory Visit: Payer: 59 | Admitting: Podiatry

## 2022-12-19 ENCOUNTER — Ambulatory Visit (INDEPENDENT_AMBULATORY_CARE_PROVIDER_SITE_OTHER): Payer: 59

## 2022-12-19 ENCOUNTER — Encounter: Payer: Self-pay | Admitting: Podiatry

## 2022-12-19 ENCOUNTER — Other Ambulatory Visit: Payer: Self-pay | Admitting: Podiatry

## 2022-12-19 DIAGNOSIS — M76821 Posterior tibial tendinitis, right leg: Secondary | ICD-10-CM

## 2022-12-19 DIAGNOSIS — M722 Plantar fascial fibromatosis: Secondary | ICD-10-CM

## 2022-12-19 MED ORDER — TRIAMCINOLONE ACETONIDE 10 MG/ML IJ SUSP
10.0000 mg | Freq: Once | INTRAMUSCULAR | Status: AC
Start: 2022-12-19 — End: 2022-12-19
  Administered 2022-12-19: 10 mg via INTRA_ARTICULAR

## 2022-12-22 NOTE — Progress Notes (Signed)
Subjective:   Patient ID: Tammy [name], female   [name]: 62 y.o.   MRN: [id number]   HPI Patient states she is starting to get more pain it seems to be more in her heel then in the inside of the ankle   ROS      Objective:  Physical Exam  Neurovascular status OTA patient does have collapse medial longitudinal arch bilateral has inflammation of the plantar fascia at the insertion calcaneus right     Assessment:  Acute fasciitis like symptoms right with chronic posterior tibial tendinitis due to obesity and lymphedema     Plan:  Reviewed condition will get a focus on the heel today sterile prep injected the plantar fascia 3 mg Kenalog 5 mg Xylocaine continue supportive rigid bottom shoes reappoint as symptoms indicate

## 2022-12-24 ENCOUNTER — Ambulatory Visit: Payer: 59 | Admitting: Podiatry

## 2023-01-02 ENCOUNTER — Ambulatory Visit (INDEPENDENT_AMBULATORY_CARE_PROVIDER_SITE_OTHER): Payer: 59 | Admitting: Podiatry

## 2023-01-02 ENCOUNTER — Encounter: Payer: Self-pay | Admitting: Podiatry

## 2023-01-02 DIAGNOSIS — M722 Plantar fascial fibromatosis: Secondary | ICD-10-CM

## 2023-01-02 DIAGNOSIS — M76821 Posterior tibial tendinitis, right leg: Secondary | ICD-10-CM | POA: Diagnosis not present

## 2023-01-02 NOTE — Progress Notes (Signed)
Subjective:   Patient ID: Tammy [name], female   [name]: 62 y.o.   MRN: [id number]   HPI Patient states the heel was still really bothering her but its gotten a little bit better recently and seems to be making progress   ROS      Objective:  Physical Exam  Neurovascular status intact with obesity and lymphedema complicating factors along with flatfoot with diminishment of discomfort plantar heel right still present but improved     Assessment:  Inflammatory fasciitis which recently has improved with shoe gear changes      Plan:  H&P reviewed and I have recommended continued shoe gear changes were going to try not to do any further injections and we will see the response to this conservatively

## 2023-01-16 ENCOUNTER — Encounter: Payer: Self-pay | Admitting: Family Medicine

## 2023-01-16 NOTE — Telephone Encounter (Signed)
Pt was called and scheduled !

## 2023-02-07 ENCOUNTER — Other Ambulatory Visit: Payer: Self-pay | Admitting: Family Medicine

## 2023-02-07 DIAGNOSIS — I10 Essential (primary) hypertension: Secondary | ICD-10-CM

## 2023-02-09 ENCOUNTER — Ambulatory Visit (INDEPENDENT_AMBULATORY_CARE_PROVIDER_SITE_OTHER): Payer: 59 | Admitting: Family Medicine

## 2023-02-09 ENCOUNTER — Encounter: Payer: Self-pay | Admitting: Family Medicine

## 2023-02-09 VITALS — BP 136/88 | HR 62 | Temp 97.9°F | Resp 18 | Ht 66.0 in | Wt 314.4 lb

## 2023-02-09 DIAGNOSIS — E2839 Other primary ovarian failure: Secondary | ICD-10-CM

## 2023-02-09 DIAGNOSIS — M542 Cervicalgia: Secondary | ICD-10-CM | POA: Diagnosis not present

## 2023-02-09 DIAGNOSIS — I10 Essential (primary) hypertension: Secondary | ICD-10-CM

## 2023-02-09 DIAGNOSIS — I890 Lymphedema, not elsewhere classified: Secondary | ICD-10-CM | POA: Diagnosis not present

## 2023-02-09 DIAGNOSIS — F40240 Claustrophobia: Secondary | ICD-10-CM

## 2023-02-09 DIAGNOSIS — Z0000 Encounter for general adult medical examination without abnormal findings: Secondary | ICD-10-CM | POA: Diagnosis not present

## 2023-02-09 DIAGNOSIS — E1165 Type 2 diabetes mellitus with hyperglycemia: Secondary | ICD-10-CM | POA: Diagnosis not present

## 2023-02-09 DIAGNOSIS — K219 Gastro-esophageal reflux disease without esophagitis: Secondary | ICD-10-CM

## 2023-02-09 DIAGNOSIS — R591 Generalized enlarged lymph nodes: Secondary | ICD-10-CM

## 2023-02-09 DIAGNOSIS — E785 Hyperlipidemia, unspecified: Secondary | ICD-10-CM

## 2023-02-09 DIAGNOSIS — M5412 Radiculopathy, cervical region: Secondary | ICD-10-CM

## 2023-02-09 DIAGNOSIS — Z23 Encounter for immunization: Secondary | ICD-10-CM | POA: Diagnosis not present

## 2023-02-09 MED ORDER — DIAZEPAM 5 MG PO TABS
ORAL_TABLET | ORAL | 0 refills | Status: DC
Start: 2023-02-09 — End: 2023-08-06

## 2023-02-09 MED ORDER — TIRZEPATIDE 10 MG/0.5ML ~~LOC~~ SOAJ: 10.0000 mg | SUBCUTANEOUS | 2 refills | Status: DC

## 2023-02-09 MED ORDER — PANTOPRAZOLE SODIUM 40 MG PO TBEC
80.0000 mg | DELAYED_RELEASE_TABLET | Freq: Every day | ORAL | 3 refills | Status: DC
Start: 2023-02-09 — End: 2023-02-09

## 2023-02-09 MED ORDER — RABEPRAZOLE SODIUM 20 MG PO TBEC
20.0000 mg | DELAYED_RELEASE_TABLET | Freq: Every day | ORAL | 5 refills | Status: DC
Start: 2023-02-09 — End: 2023-08-06

## 2023-02-09 NOTE — Assessment & Plan Note (Signed)
Well controlled, no changes to meds. Encouraged heart healthy diet such as the DASH diet and exercise as tolerated.  °

## 2023-02-09 NOTE — Assessment & Plan Note (Signed)
Encourage heart healthy diet such as MIND or DASH diet, increase exercise, avoid trans fats, simple carbohydrates and processed foods, consider a krill or fish or flaxseed oil cap daily.  °

## 2023-02-09 NOTE — Assessment & Plan Note (Signed)
Ghm utd Check labs  See [facility] Maintenance  Topic Date Due   Colonoscopy  05/11/2022   OPHTHALMOLOGY EXAM  07/11/2022   COVID-19 Vaccine (4 - 2023-24 season) 01/04/2023   HEMOGLOBIN A1C  01/10/2023   Diabetic kidney evaluation - Urine ACR  04/04/2023   FOOT EXAM  05/01/2023   Diabetic kidney evaluation - eGFR measurement  07/10/2023   MAMMOGRAM  10/24/2023   DTaP/Tdap/Td (3 - Td or Tdap) 08/05/2027   INFLUENZA VACCINE  Completed   Hepatitis C Screening  Completed   HIV Screening  Completed   Zoster Vaccines- Shingrix  Completed   HPV VACCINES  Aged Out

## 2023-02-09 NOTE — Assessment & Plan Note (Signed)
Pt first If no improvement ---- mri

## 2023-02-09 NOTE — Assessment & Plan Note (Signed)
Refer to PT

## 2023-02-09 NOTE — Progress Notes (Signed)
Established Patient Office Visit  Subjective   Patient ID: Tammy [name], female    [name]: 08-27-1960  Age: 62 y.o. MRN: [id number]  Chief Complaint  Patient presents with   Annual Exam    HPI    ROS    Objective:     Resp 18   Ht 5\' 6"  (1.676 m)   Wt (!) 314 lb 6.4 oz (142.6 kg)   BMI 50.75 kg/m    Physical Exam   No results found for any visits on 02/09/23.    The 10-year ASCVD risk score (Arnett DK, et al., 2019) is: 18.4%    Assessment & Plan:   Problem List Items Addressed This Visit       Unprioritized   Preventative health care - Primary   Essential hypertension   Relevant Orders   CBC with Differential/Platelet   TSH   Gastroesophageal reflux disease   Relevant Medications   pantoprazole (PROTONIX) 40 MG tablet   Other Visit Diagnoses     Type 2 diabetes mellitus with hyperglycemia, without long-term current use of insulin (HCC)       Relevant Orders   Comprehensive metabolic panel   Hemoglobin A1c   Lipid panel   Microalbumin / creatinine urine ratio   Hyperlipidemia, unspecified hyperlipidemia type       Relevant Orders   Comprehensive metabolic panel   Lipid panel   Microalbumin / creatinine urine ratio       No follow-ups on file.    Donato Schultz, DO

## 2023-02-09 NOTE — Assessment & Plan Note (Signed)
Hgba1c to be checked , minimize simple carbs. Increase exercise as tolerated. Continue current meds

## 2023-02-09 NOTE — Telephone Encounter (Signed)
Appt later today.

## 2023-02-10 LAB — COMPREHENSIVE METABOLIC PANEL
ALT: 19 U/L (ref 0–35)
AST: 17 U/L (ref 0–37)
Albumin: 3.9 g/dL (ref 3.5–5.2)
Alkaline Phosphatase: 81 U/L (ref 39–117)
BUN: 15 mg/dL (ref 6–23)
CO2: 31 meq/L (ref 19–32)
Calcium: 9 mg/dL (ref 8.4–10.5)
Chloride: 102 meq/L (ref 96–112)
Creatinine, Ser: 1 mg/dL (ref 0.40–1.20)
GFR: 60.59 mL/min (ref >60.00)
Glucose, Bld: 88 mg/dL (ref 70–99)
Potassium: 3.9 meq/L (ref 3.5–5.1)
Sodium: 141 meq/L (ref 135–145)
Total Bilirubin: 0.8 mg/dL (ref 0.2–1.2)
Total Protein: 6.7 g/dL (ref 6.0–8.3)

## 2023-02-10 LAB — LIPID PANEL
Cholesterol: 190 mg/dL (ref 0–200)
HDL: 79 mg/dL (ref >39.00)
LDL Cholesterol: 102 mg/dL — ABNORMAL HIGH (ref 0–99)
NonHDL: 111.08
Total CHOL/HDL Ratio: 2
Triglycerides: 47 mg/dL (ref 0.0–149.0)
VLDL: 9.4 mg/dL (ref 0.0–40.0)

## 2023-02-10 LAB — CBC WITH DIFFERENTIAL/PLATELET
Basophils Absolute: 0 10*3/uL (ref 0.0–0.1)
Basophils Relative: 1 % (ref 0.0–3.0)
Eosinophils Absolute: 0.1 10*3/uL (ref 0.0–0.7)
Eosinophils Relative: 3.2 % (ref 0.0–5.0)
HCT: 39.7 % (ref 36.0–46.0)
Hemoglobin: 12.7 g/dL (ref 12.0–15.0)
Lymphocytes Relative: 41.1 % (ref 12.0–46.0)
Lymphs Abs: 1.4 10*3/uL (ref 0.7–4.0)
MCHC: 32.1 g/dL (ref 30.0–36.0)
MCV: 89.3 fL (ref 78.0–100.0)
Monocytes Absolute: 0.3 10*3/uL (ref 0.1–1.0)
Monocytes Relative: 8.3 % (ref 3.0–12.0)
Neutro Abs: 1.5 10*3/uL (ref 1.4–7.7)
Neutrophils Relative %: 46.4 % (ref 43.0–77.0)
Platelets: 254 10*3/uL (ref 150.0–400.0)
RBC: 4.44 Mil/uL (ref 3.87–5.11)
RDW: 14.6 % (ref 11.5–15.5)
WBC: 3.3 10*3/uL — ABNORMAL LOW (ref 4.0–10.5)

## 2023-02-10 LAB — MICROALBUMIN / CREATININE URINE RATIO
Creatinine,U: 149.2 mg/dL
Microalb Creat Ratio: 0.5 mg/g (ref 0.0–30.0)
Microalb, Ur: <0.7 mg/dL (ref 0.0–1.9)

## 2023-02-10 LAB — TSH: TSH: 2.13 u[IU]/mL (ref 0.35–5.50)

## 2023-02-10 LAB — HEMOGLOBIN A1C: Hgb A1c MFr Bld: 5.7 % (ref 4.6–6.5)

## 2023-02-19 ENCOUNTER — Ambulatory Visit (HOSPITAL_BASED_OUTPATIENT_CLINIC_OR_DEPARTMENT_OTHER)
Admission: RE | Admit: 2023-02-19 | Discharge: 2023-02-19 | Disposition: A | Discharge status: Home / Self Care | Payer: 59 | Source: Ambulatory Visit | Attending: Family Medicine | Admitting: Family Medicine

## 2023-02-19 DIAGNOSIS — E2839 Other primary ovarian failure: Secondary | ICD-10-CM | POA: Diagnosis present

## 2023-03-09 ENCOUNTER — Ambulatory Visit (INDEPENDENT_AMBULATORY_CARE_PROVIDER_SITE_OTHER): Payer: 59 | Admitting: Family Medicine

## 2023-03-09 VITALS — BP 140/69 | Ht 66.0 in | Wt 311.0 lb

## 2023-03-09 DIAGNOSIS — M170 Bilateral primary osteoarthritis of knee: Secondary | ICD-10-CM | POA: Diagnosis not present

## 2023-03-09 NOTE — Patient Instructions (Signed)
Your pain is due to arthritis. Take meloxicam 15mg  daily with food for pain and inflammation - if you need more of this let me know but I'd take it for a week then as needed. Straight leg raises, knee extensions 3 sets of 10 once a day (add ankle weight if these become too easy). Cortisone and gel injections are an option. It's important that you continue to stay active. Consider physical therapy to strengthen muscles around the joint that hurts to take pressure off of the joint itself. Shoe inserts with good arch support may be helpful. Heat or ice 15 minutes at a time 3-4 times a day as needed to help with pain. Follow up with me in 1 month.

## 2023-03-10 NOTE — Progress Notes (Signed)
PCP: Donato Schultz, DO  Subjective:   HPI: Patient is a 62 y.o. female here for bilateral knee pain.  Patient with known history of bilateral knee arthritis. States over past week she's had pain return. Associated right knee giving out, left knee with some swelling and a knot anteriorly. Hard to extend both knees due to discomfort. Done well in past with visco injections.  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   GERD (gastroesophageal reflux disease)    History of colon polyps    Hyperlipidemia    Hypertension    Joint pain    Lactose intolerance    Morbid obesity - BMI > 50 12/13/2012   Pneumonia    none recent   PONV (postoperative nausea and vomiting)    Poor venous access    "usually difficult stick"   Prediabetes    Sinusitis    SOB (shortness of breath)    Swelling    Vitamin D deficiency     Current Outpatient Medications on File Prior to Visit  Medication Sig Dispense Refill   amLODipine (NORVASC) 2.5 MG tablet Take 1 tablet (2.5 mg total) by mouth 2 (two) times daily. 180 tablet 3   carvedilol (COREG) 6.25 MG tablet Take 1 tablet (6.25 mg total) by mouth 2 (two) times daily with a meal. 180 tablet 1   cyclobenzaprine (FLEXERIL) 10 MG tablet Take 1 tablet (10 mg total) by mouth 3 (three) times daily as needed for muscle spasms. 30 tablet 0   diazepam (VALIUM) 5 MG tablet 1 po 30 min prior to procedure 30 tablet 0   fluticasone (FLONASE) 50 MCG/ACT nasal spray Place 1 spray into both nostrils 2 (two) times daily as needed for allergies or rhinitis. 48 g 1   furosemide (LASIX) 40 MG tablet TAKE  1 TABLET BY MOUTH DAILY 90 tablet 1   glucose blood test strip Check blood sugar twice daily 100 each 11   loratadine (CLARITIN) 10 MG tablet Take 1 tablet (10 mg total) by mouth daily. 30 tablet 11   meloxicam (MOBIC) 15 MG tablet 1/2-1 po qd as needed 30 tablet 2   Multiple Vitamins-Minerals (MULTIVITAMIN ADULT PO) Take 1 tablet by mouth daily. flintstone vitamins     NONFORMULARY OR COMPOUNDED ITEM Thigh high compression socks  20-30 mmhg    Re- low ext edema 1 each 1   NONFORMULARY OR COMPOUNDED ITEM Compression socks 20-2mm/hg  #1  as directed 1 each 1   NONFORMULARY OR COMPOUNDED ITEM Compression stockings #1  20-30 mm hg 1 each 1   RABEprazole (ACIPHEX) 20 MG tablet Take 1 tablet (20 mg total) by mouth daily. 30 tablet 5   tirzepatide (MOUNJARO) 10 MG/0.5ML Pen Inject 10 mg into the skin once a week. 6 mL 2   Vitamin D, Ergocalciferol, (DRISDOL) 1.25 MG (50000 UNIT) CAPS capsule Take 1 capsule (50,000 Units total) by mouth every 7 (seven) days. 12 capsule 1   No current facility-administered medications on file prior to visit.    Past Surgical History:  Procedure  Laterality Date   ABDOMINAL HYSTERECTOMY     APPENDECTOMY     2'14   COLONOSCOPY  04/11/11   5 mm polyp removed but not recovered   COLONOSCOPY WITH PROPOFOL N/A 05/11/2017   Procedure: COLONOSCOPY WITH PROPOFOL;  Surgeon: Iva Boop, MD;  Location: [facility];  Service: Endoscopy;  Laterality: N/A;   GASTRIC ROUX-EN-Y N/A 02/27/2015   Procedure: LAPAROSCOPIC ROUX-EN-Y GASTRIC BYPASS WITH UPPER ENDOSCOPY;  Surgeon: Luretha Murphy, MD;  Location: [facility];  Service: General;  Laterality: N/A;   KNEE SURGERY Right     KNEE SURGERY    LAPAROSCOPIC APPENDECTOMY  06/10/2012   Procedure: APPENDECTOMY LAPAROSCOPIC;  Surgeon: Ardeth Sportsman, MD;  Location: [facility];  Service: General;  Laterality: N/A;   UPPER GASTROINTESTINAL ENDOSCOPY  04/14/2006   Normal    Allergies  Allergen Reactions   Ace Inhibitors Anaphylaxis     Bowel angioedema   Cheese Nausea And Vomiting   Red Dye #40 (Allura Red) Nausea And Vomiting   Tuna [Fish Allergy] Nausea And Vomiting    BP (!) 140/69   Ht 5\' 6"  (1.676 m)   Wt (!) 311 lb (141.1 kg)   BMI 50.20 kg/m       No data to display              No data to display              Objective:  Physical Exam:  Gen: NAD, comfortable in exam room  Bilateral knees: No gross deformity, ecchymoses.  Mild effusions.  Crepitus bilaterally. TTP medial joint lines. FROM with normal strength. Negative ant/post drawers. Negative valgus/varus testing. Negative lachman.  Negative mcmurrays, apleys.  NV intact distally.   Assessment & Plan:  1. Bilateral knee pain - exam overall reassuring.  Consistent with knee osteoarthritis.  Discussed options - will do home exercises.  Consider physical therapy.  Meloxicam daily with food.  Consider steroid or visco injections.  F/u in 1 month.

## 2023-05-27 ENCOUNTER — Other Ambulatory Visit: Payer: Self-pay | Admitting: Family Medicine

## 2023-05-27 DIAGNOSIS — J302 Other seasonal allergic rhinitis: Secondary | ICD-10-CM

## 2023-08-04 ENCOUNTER — Other Ambulatory Visit: Payer: Self-pay | Admitting: Family Medicine

## 2023-08-04 DIAGNOSIS — K219 Gastro-esophageal reflux disease without esophagitis: Secondary | ICD-10-CM

## 2023-08-04 DIAGNOSIS — M545 Low back pain, unspecified: Secondary | ICD-10-CM

## 2023-08-06 ENCOUNTER — Ambulatory Visit: Admitting: Family Medicine

## 2023-08-06 ENCOUNTER — Encounter: Payer: Self-pay | Admitting: Family Medicine

## 2023-08-06 VITALS — BP 144/90 | HR 58 | Temp 97.8°F | Resp 18 | Ht 66.0 in | Wt 313.0 lb

## 2023-08-06 DIAGNOSIS — I10 Essential (primary) hypertension: Secondary | ICD-10-CM | POA: Diagnosis not present

## 2023-08-06 DIAGNOSIS — E1165 Type 2 diabetes mellitus with hyperglycemia: Secondary | ICD-10-CM | POA: Diagnosis not present

## 2023-08-06 DIAGNOSIS — K219 Gastro-esophageal reflux disease without esophagitis: Secondary | ICD-10-CM | POA: Diagnosis not present

## 2023-08-06 DIAGNOSIS — E785 Hyperlipidemia, unspecified: Secondary | ICD-10-CM | POA: Diagnosis not present

## 2023-08-06 DIAGNOSIS — Z1211 Encounter for screening for malignant neoplasm of colon: Secondary | ICD-10-CM

## 2023-08-06 DIAGNOSIS — Z7985 Long-term (current) use of injectable non-insulin antidiabetic drugs: Secondary | ICD-10-CM

## 2023-08-06 MED ORDER — RABEPRAZOLE SODIUM 20 MG PO TBEC: 20.0000 mg | DELAYED_RELEASE_TABLET | Freq: Every day | ORAL | 3 refills | Status: AC

## 2023-08-06 MED ORDER — CARVEDILOL 3.125 MG PO TABS
3.1250 mg | ORAL_TABLET | Freq: Two times a day (BID) | ORAL | 3 refills | Status: DC
Start: 2023-08-06 — End: 2023-10-29

## 2023-08-06 NOTE — Patient Instructions (Signed)
 Hypertension, Adult High blood pressure (hypertension) is when the force of blood pumping through the arteries is too strong. The arteries are the blood vessels that carry blood from the heart throughout the body. Hypertension forces the heart to work harder to pump blood and may cause arteries to become narrow or stiff. Untreated or uncontrolled hypertension can lead to a heart attack, heart failure, a stroke, kidney disease, and other problems. A blood pressure reading consists of a higher number over a lower number. Ideally, your blood pressure should be below 120/80. The first ("top") number is called the systolic pressure. It is a measure of the pressure in your arteries as your heart beats. The second ("bottom") number is called the diastolic pressure. It is a measure of the pressure in your arteries as the heart relaxes. What are the causes? The exact cause of this condition is not known. There are some conditions that result in high blood pressure. What increases the risk? Certain factors may make you more likely to develop high blood pressure. Some of these risk factors are under your control, including: Smoking. Not getting enough exercise or physical activity. Being overweight. Having too much fat, sugar, calories, or salt (sodium) in your diet. Drinking too much alcohol. Other risk factors include: Having a personal history of heart disease, diabetes, high cholesterol, or kidney disease. Stress. Having a family history of high blood pressure and high cholesterol. Having obstructive sleep apnea. Age. The risk increases with age. What are the signs or symptoms? High blood pressure may not cause symptoms. Very high blood pressure (hypertensive crisis) may cause: Headache. Fast or irregular heartbeats (palpitations). Shortness of breath. Nosebleed. Nausea and vomiting. Vision changes. Severe chest pain, dizziness, and seizures. How is this diagnosed? This condition is diagnosed by  measuring your blood pressure while you are seated, with your arm resting on a flat surface, your legs uncrossed, and your feet flat on the floor. The cuff of the blood pressure monitor will be placed directly against the skin of your upper arm at the level of your heart. Blood pressure should be measured at least twice using the same arm. Certain conditions can cause a difference in blood pressure between your right and left arms. If you have a high blood pressure reading during one visit or you have normal blood pressure with other risk factors, you may be asked to: Return on a different day to have your blood pressure checked again. Monitor your blood pressure at home for 1 week or longer. If you are diagnosed with hypertension, you may have other blood or imaging tests to help your health care provider understand your overall risk for other conditions. How is this treated? This condition is treated by making healthy lifestyle changes, such as eating healthy foods, exercising more, and reducing your alcohol intake. You may be referred for counseling on a healthy diet and physical activity. Your health care provider may prescribe medicine if lifestyle changes are not enough to get your blood pressure under control and if: Your systolic blood pressure is above 130. Your diastolic blood pressure is above 80. Your personal target blood pressure may vary depending on your medical conditions, your age, and other factors. Follow these instructions at home: Eating and drinking  Eat a diet that is high in fiber and potassium, and low in sodium, added sugar, and fat. An example of this eating plan is called the DASH diet. DASH stands for Dietary Approaches to Stop Hypertension. To eat this way: Eat  plenty of fresh fruits and vegetables. Try to fill one half of your plate at each meal with fruits and vegetables. Eat whole grains, such as whole-wheat pasta, brown rice, or whole-grain bread. Fill about one  fourth of your plate with whole grains. Eat or drink low-fat dairy products, such as skim milk or low-fat yogurt. Avoid fatty cuts of meat, processed or cured meats, and poultry with skin. Fill about one fourth of your plate with lean proteins, such as fish, chicken without skin, beans, eggs, or tofu. Avoid pre-made and processed foods. These tend to be higher in sodium, added sugar, and fat. Reduce your daily sodium intake. Many people with hypertension should eat less than 1,500 mg of sodium a day. Do not drink alcohol if: Your health care provider tells you not to drink. You are pregnant, may be pregnant, or are planning to become pregnant. If you drink alcohol: Limit how much you have to: 0-1 drink a day for women. 0-2 drinks a day for men. Know how much alcohol is in your drink. In the [place] one drink equals one 12 oz bottle of beer (355 mL), one 5 oz glass of wine (148 mL), or one 1 oz glass of hard liquor (44 mL). Lifestyle  Work with your health care provider to maintain a healthy body weight or to lose weight. Ask what an ideal weight is for you. Get at least 30 minutes of exercise that causes your heart to beat faster (aerobic exercise) most days of the week. Activities may include walking, swimming, or biking. Include exercise to strengthen your muscles (resistance exercise), such as Pilates or lifting weights, as part of your weekly exercise routine. Try to do these types of exercises for 30 minutes at least 3 days a week. Do not use any products that contain nicotine or tobacco. These products include cigarettes, chewing tobacco, and vaping devices, such as e-cigarettes. If you need help quitting, ask your health care provider. Monitor your blood pressure at home as told by your health care provider. Keep all follow-up visits. This is important. Medicines Take over-the-counter and prescription medicines only as told by your health care provider. Follow directions carefully. Blood  pressure medicines must be taken as prescribed. Do not skip doses of blood pressure medicine. Doing this puts you at risk for problems and can make the medicine less effective. Ask your health care provider about side effects or reactions to medicines that you should watch for. Contact a health care provider if you: Think you are having a reaction to a medicine you are taking. Have headaches that keep coming back (recurring). Feel dizzy. Have swelling in your ankles. Have trouble with your vision. Get help right away if you: Develop a severe headache or confusion. Have unusual weakness or numbness. Feel faint. Have severe pain in your chest or abdomen. Vomit repeatedly. Have trouble breathing. These symptoms may be an emergency. Get help right away. Call 911. Do not wait to see if the symptoms will go away. Do not drive yourself to the hospital. Summary Hypertension is when the force of blood pumping through your arteries is too strong. If this condition is not controlled, it may put you at risk for serious complications. Your personal target blood pressure may vary depending on your medical conditions, your age, and other factors. For most people, a normal blood pressure is less than 120/80. Hypertension is treated with lifestyle changes, medicines, or a combination of both. Lifestyle changes include losing weight, eating a healthy,  low-sodium diet, exercising more, and limiting alcohol. This information is not intended to replace advice given to you by your health care provider. Make sure you discuss any questions you have with your health care provider. Document Revised: 02/26/2021 Document Reviewed: 02/26/2021 Elsevier Patient Education  2024 [employer].

## 2023-08-06 NOTE — Progress Notes (Signed)
 Established Patient Office Visit  Subjective   Patient ID: Tammy [name], female    [name]: 03/07/1961  Age: 63 y.o. MRN: [id number]  Chief Complaint  Patient presents with   Hypertension   Follow-up    HPI Discussed the use of AI scribe software for clinical note transcription with the patient, who gave verbal consent to proceed.  History of Present Illness Tammy [name] is a 63 year old female who presents for a follow-up visit.  She is requesting a refill of Aciphex (rabeprazole) for her gastroesophageal reflux disease (GERD). She has been taking it intermittently and is unsure of its effectiveness due to inconsistent use.  She is currently on amlodipine for hypertension and takes carvedilol once daily instead of twice due to fatigue and low energy, which she attributes to low blood pressure when taking the full dose. She monitors her blood pressure and finds it manageable with the adjusted dose.  She experiences stiffness and pain in her legs, especially after prolonged sitting or sleeping, and takes meloxicam daily, which has improved her symptoms. She has started using turmeric and reports creaking in her shoulders and neck. Past x-rays have shown arthritis, and she previously received VSCO injections in her knees, which were effective but painful.  She is on [facility] for weight management and has been making progress by focusing on smaller weight loss goals, currently aiming for a weight of 280 pounds. She has stopped attending the healthy weight and wellness program due to feeling overwhelmed and has instead focused on helping others, which she feels has improved her mental well-being.  She mentions a kidney notification regarding protein in her urine, which was attributed to a lab error.  She discusses her husband's recent hospitalization due to the flu, which required urgent care and a hospital stay of three to four days. She has been managing both her and her husband's  medications, noting that he also takes amlodipine. She has had to take on more responsibility for his healthcare as he struggles to understand medical instructions.   Patient Active Problem List   Diagnosis Date Noted   Cervical radiculopathy 02/09/2023   Gastroesophageal reflux disease 08/14/2022   Claustrophobia 08/14/2022   Myalgia 08/14/2022   Bronchitis due to COVID-19 virus 01/14/2022   Acute left-sided low back pain without sciatica 11/28/2021   Bronchitis 09/26/2021   Multiple atypical skin moles 06/21/2021   Vertigo 06/21/2021   Acute cough 04/09/2021   History of pneumonia 04/09/2021   Pneumonia due to infectious organism 03/12/2021   Preventative health care 02/15/2021   Osteoarthritis of left knee 12/12/2020   Primary osteoarthritis of right knee 12/12/2020   Low back pain with radiation 03/14/2020   Hyperlipidemia 04/07/2019   Type 2 diabetes mellitus with hyperglycemia, without long-term current use of insulin (HCC) 04/07/2019   Neck pain 04/07/2019   Chronic seasonal allergic rhinitis due to pollen 04/07/2019   Lymphedema 03/04/2019   Edema due to malnutrition (HCC) 10/10/2018   Class 3 severe obesity with serious comorbidity and body mass index (BMI) of 50.0 to 59.9 in adult 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 02/27/2015   Enteritis 01/25/2015   Ascites 01/25/2015   Abdominal pain 01/22/2015   Ovarian cyst, left 01/03/2015   Allergic rhinitis 12/21/2013   Right ankle pain 11/10/2013   Morbid obesity - BMI >  50 12/13/2012   Chronic vomiting 12/13/2012   Lactose intolerance 10/22/2012   Left knee pain 07/26/2012   Bacterial vaginosis 06/10/2012   Bradycardia, sinus 03/28/2012   Central positional vertigo 03/27/2012   Tongue swelling 08/21/2011   Urgency of urination 05/14/2011   History of colonic polyps  04/11/2011   Otalgia 11/14/2010   OTHER TENOSYNOVITIS OF HAND AND WRIST 07/19/2010   DYSPEPSIA 10/17/2009   BACK PAIN, LUMBAR, WITH RADICULOPATHY 12/27/2008   VITAMIN B12 DEFICIENCY 09/29/2008   SYNCOPE 08/21/2008   ANKLE EDEMA, CHRONIC 12/16/2006   DM (diabetes mellitus) type II, controlled, with peripheral vascular disorder (HCC) 11/09/2006   Hyperlipidemia LDL goal <70 11/09/2006   Essential hypertension 05/26/2006   GERD 05/26/2006   Past Medical History:  Diagnosis Date   Acute on chronic appendicitis s/p lap appy 06/10/2012 06/10/2012   surgery done 06-10-12   Angioedema of intestine due to angiotensin converting enzyme inhibitor (ACE-I)    Arthritis    ankle   Back pain    Chest pain    Complication of anesthesia    Constipation    Diabetes mellitus    TYPE 2- no meds in several months-was taken off meds -monitoring blood sugar.   GERD (gastroesophageal reflux disease)    History of colon polyps    Hyperlipidemia    Hypertension    Joint pain    Lactose intolerance    Morbid obesity - BMI > 50 12/13/2012   Pneumonia    none recent   PONV (postoperative nausea and vomiting)    Poor venous access    "usually difficult stick"   Prediabetes    Sinusitis    SOB (shortness of breath)    Swelling    Vitamin D deficiency    Past Surgical History:  Procedure Laterality Date   ABDOMINAL HYSTERECTOMY     APPENDECTOMY     2'14   COLONOSCOPY  04/11/11   5 mm polyp removed but not recovered   COLONOSCOPY WITH PROPOFOL N/A 05/11/2017   Procedure: COLONOSCOPY WITH PROPOFOL;  Surgeon: Iva Boop, MD;  Location: [facility];  Service: Endoscopy;  Laterality: N/A;   GASTRIC ROUX-EN-Y N/A 02/27/2015   Procedure: LAPAROSCOPIC ROUX-EN-Y GASTRIC BYPASS WITH UPPER ENDOSCOPY;  Surgeon: Luretha Murphy, MD;  Location: [facility];  Service: General;  Laterality: N/A;   KNEE SURGERY Right     KNEE SURGERY    LAPAROSCOPIC APPENDECTOMY  06/10/2012   Procedure: APPENDECTOMY LAPAROSCOPIC;   Surgeon: Ardeth Sportsman, MD;  Location: [facility];  Service: General;  Laterality: N/A;   UPPER GASTROINTESTINAL ENDOSCOPY  04/14/2006   Normal   Social History   Tobacco Use   Smoking status: Never   Smokeless tobacco: Never  Vaping Use   Vaping status: Never Used  Substance Use Topics   Alcohol use: No    Alcohol/week: 0.0 standard drinks of alcohol   Drug use: No   Social History   Socioeconomic History   Marital status: Married    Spouse name: Engineer, civil (consulting)   Number of children: 0   Years of education: Not on file   Highest education level: Not on file  Occupational History   Occupation: FAB Set designer: [employer]  Tobacco Use   Smoking status: Never   Smokeless tobacco: Never  Vaping Use   Vaping status: Never Used  Substance and Sexual Activity   Alcohol use: No    Alcohol/week: 0.0 standard drinks of alcohol   Drug use:  No   Sexual activity: Not on file  Other Topics Concern   Not on file  Social History Narrative   Married, works at [employer]   No children   No alcohol tobacco or drug use   Social Drivers of Corporate investment banker Strain: Not on file  Food Insecurity: Not on file  Transportation Needs: Not on file  Physical Activity: Not on file  Stress: Not on file  Social Connections: Unknown (12/10/2021)   Received [facility]   Social Network    Social Network: Not on file  Intimate Partner Violence: Unknown (12/10/2021)   Received from [facility], [facility]   HITS    Physically Hurt: Not on file    Insult or Talk Down To: Not on file    Threaten Physical Harm: Not on file    Scream or Curse: Not on file   Family Status  Relation Name Status   Mother  Deceased   Father  Deceased at age 37   Brother  Alive   Brother  Alive   Brother  Alive   Brother  Alive   PGM  (Not Specified)   Garment/textile technologist Aunt  Deceased   Pat Aunt  Deceased   Cousin  Deceased   Neg Hx  (Not Specified)  No partnership data on  file   Family History  Problem Relation Age of Onset   Hyperlipidemia Mother    Hypertension Mother    Cancer Mother 14       hodgkins lymphoma   Diabetes Father    Heart failure Father    Heart disease Father    Hyperlipidemia Brother    Diabetes Paternal Grandmother    Breast cancer Paternal Aunt        unsure of age   Breast cancer Paternal Aunt    Heart disease Cousin    Colon cancer Neg Hx    Heart attack Neg Hx    Sudden death Neg Hx    Colon polyps Neg Hx    Esophageal cancer Neg Hx    Stomach cancer Neg Hx    Rectal cancer Neg Hx    Allergies  Allergen Reactions   Ace Inhibitors Anaphylaxis    Bowel angioedema   Cheese Nausea And Vomiting   Red Dye #40 (Allura Red) Nausea And Vomiting   Tuna [Fish Allergy] Nausea And Vomiting      ROS    Objective:     BP (!) 144/90 (BP Location: Right Arm, Patient Position: Sitting, Cuff Size: Large)   Pulse (!) 58   Temp 97.8 F (36.6 C) (Oral)   Resp 18   Ht 5\' 6"  (1.676 m)   Wt (!) 313 lb (142 kg)   SpO2 99%   BMI 50.52 kg/m  BP Readings from Last 3 Encounters:  08/06/23 (!) 144/90  03/09/23 (!) 140/69  02/09/23 136/88   Wt Readings from Last 3 Encounters:  08/06/23 (!) 313 lb (142 kg)  03/09/23 (!) 311 lb (141.1 kg)  02/09/23 (!) 314 lb 6.4 oz (142.6 kg)   SpO2 Readings from Last 3 Encounters:  08/06/23 99%  02/09/23 99%  08/14/22 97%      Physical Exam   No results found for any visits on 08/06/23.  Last CBC Lab Results  Component Value Date   WBC 3.3 (L) 02/09/2023   HGB 12.7 02/09/2023   HCT 39.7 02/09/2023   MCV 89.3 02/09/2023   MCH 28.4 10/08/2018  RDW 14.6 02/09/2023   PLT 254.0 02/09/2023   Last metabolic panel Lab Results  Component Value Date   GLUCOSE 88 02/09/2023   NA 141 02/09/2023   K 3.9 02/09/2023   CL 102 02/09/2023   CO2 31 02/09/2023   BUN 15 02/09/2023   CREATININE 1.00 02/09/2023   GFR 60.59 02/09/2023   CALCIUM 9.0 02/09/2023   PROT 6.7 02/09/2023    ALBUMIN 3.9 02/09/2023   LABGLOB 2.7 06/03/2018   AGRATIO 1.6 06/03/2018   BILITOT 0.8 02/09/2023   ALKPHOS 81 02/09/2023   AST 17 02/09/2023   ALT 19 02/09/2023   ANIONGAP 5 04/22/2018   Last lipids Lab Results  Component Value Date   CHOL 190 02/09/2023   HDL 79.00 02/09/2023   LDLCALC 102 (H) 02/09/2023   LDLDIRECT 129.1 10/17/2009   TRIG 47.0 02/09/2023   CHOLHDL 2 02/09/2023   Last hemoglobin A1c Lab Results  Component Value Date   HGBA1C 5.7 02/09/2023   Last thyroid functions Lab Results  Component Value Date   TSH 2.13 02/09/2023   T3TOTAL 86 06/03/2018   Last vitamin D Lab Results  Component Value Date   VD25OH 10.21 (L) 07/10/2022   Last vitamin B12 and Folate Lab Results  Component Value Date   VITAMINB12 547 06/03/2018   FOLATE 12.3 06/03/2018      The 10-year ASCVD risk score (Arnett DK, et al., 2019) is: 19.3%    Assessment & Plan:   Problem List Items Addressed This Visit       Unprioritized   Hyperlipidemia   Relevant Medications   carvedilol (COREG) 3.125 MG tablet   Other Relevant Orders   Lipid panel   Morbid obesity - BMI > 50   Relevant Orders   Lipid panel   CBC with Differential/Platelet   Comprehensive metabolic panel with GFR   Hemoglobin A1c   Microalbumin / creatinine urine ratio   Gastroesophageal reflux disease   Relevant Medications   RABEprazole (ACIPHEX) 20 MG tablet   Type 2 diabetes mellitus with hyperglycemia, without long-term current use of insulin (HCC)   Relevant Orders   CBC with Differential/Platelet   Comprehensive metabolic panel with GFR   Hemoglobin A1c   Microalbumin / creatinine urine ratio   Essential hypertension - Primary   Relevant Medications   carvedilol (COREG) 3.125 MG tablet   Other Relevant Orders   Hemoglobin A1c   Other Visit Diagnoses       Colon cancer screening       Relevant Orders   Ambulatory referral to Gastroenterology     Assessment and Plan Assessment &  Plan Hypertension   She is currently taking amlodipine and carvedilol for hypertension. Carvedilol twice daily causes fatigue and hypotension, so she takes it once daily. Her blood pressure was elevated today as she did not take her medication before the visit. A lower dose of carvedilol will be prescribed to maintain twice daily dosing without causing fatigue. The prescription will be sent to [facility] pharmacy. A nurse visit is scheduled in 2-3 weeks to monitor blood pressure on the adjusted dose.  Chronic Kidney Disease (CKD)   Proteinuria was reported but determined to be a lab error. A recheck of urine protein levels is necessary to confirm kidney function status.  Gastroesophageal Reflux Disease (GERD)   She requires a refill of rabeprazole for GERD management, as she has been taking it intermittently and is unsure of its effectiveness. The prescription will be sent to [facility] pharmacy.  Osteoarthritis   She experiences stiffness and pain in her knees, shoulders, and neck, which improves with daily meloxicam and turmeric. She is considering a steroid injection for further relief, especially in light of upcoming travel. Continue meloxicam 15 mg daily. Consider adding Tylenol Arthritis for additional pain relief and over-the-counter options like Voltaren gel, aspirin cream with lidocaine, or Salonpas patches with lidocaine for localized pain relief. Contact Dr. Pearletha Forge to discuss the possibility of a steroid injection for arthritis pain management.  Obesity   She is on Mounjaro for weight management and has seen some progress, with a current weight of 313 lbs and a goal of 280 lbs. She has stopped attending the Healthy Weight and Wellness program due to logistical challenges and is focusing on smaller weight loss goals to improve outcomes. Continue Mounjaro for weight management.  General Health Maintenance   She is due for a colonoscopy and has an upcoming podiatrist appointment. She also needs to  submit a BMI form for work, which requires waist circumference measurement. Schedule a colonoscopy with Dr. Leone Payor, attend the podiatrist appointment next week, measure waist circumference, complete the BMI form for work, and drop off the BMI form at the clinic.    Return in about 3 weeks (around 08/27/2023), or if symptoms worsen or fail to improve, for nurse visit for bp check-----  f/u 6 months cpe.    Donato Schultz, DO

## 2023-08-07 ENCOUNTER — Other Ambulatory Visit (HOSPITAL_COMMUNITY): Payer: Self-pay

## 2023-08-07 LAB — CBC WITH DIFFERENTIAL/PLATELET
Basophils Absolute: 0 10*3/uL (ref 0.0–0.1)
Basophils Relative: 1.7 % (ref 0.0–3.0)
Eosinophils Absolute: 0.1 10*3/uL (ref 0.0–0.7)
Eosinophils Relative: 3.2 % (ref 0.0–5.0)
HCT: 37.2 % (ref 36.0–46.0)
Hemoglobin: 12.3 g/dL (ref 12.0–15.0)
Lymphocytes Relative: 48.5 % — ABNORMAL HIGH (ref 12.0–46.0)
Lymphs Abs: 1.2 10*3/uL (ref 0.7–4.0)
MCHC: 33.1 g/dL (ref 30.0–36.0)
MCV: 87.4 fl (ref 78.0–100.0)
Monocytes Absolute: 0.2 10*3/uL (ref 0.1–1.0)
Monocytes Relative: 8.8 % (ref 3.0–12.0)
Neutro Abs: 0.9 10*3/uL — ABNORMAL LOW (ref 1.4–7.7)
Neutrophils Relative %: 37.8 % — ABNORMAL LOW (ref 43.0–77.0)
Platelets: 257 10*3/uL (ref 150.0–400.0)
RBC: 4.25 Mil/uL (ref 3.87–5.11)
RDW: 14.7 % (ref 11.5–15.5)
WBC: 2.5 10*3/uL — ABNORMAL LOW (ref 4.0–10.5)

## 2023-08-07 LAB — LIPID PANEL
Cholesterol: 185 mg/dL (ref 0–200)
HDL: 70.6 mg/dL (ref >39.00)
LDL Cholesterol: 103 mg/dL — ABNORMAL HIGH (ref 0–99)
NonHDL: 114.51
Total CHOL/HDL Ratio: 3
Triglycerides: 57 mg/dL (ref 0.0–149.0)
VLDL: 11.4 mg/dL (ref 0.0–40.0)

## 2023-08-07 LAB — MICROALBUMIN / CREATININE URINE RATIO
Creatinine,U: 207.3 mg/dL
Microalb Creat Ratio: 4.4 mg/g (ref 0.0–30.0)
Microalb, Ur: 0.9 mg/dL (ref 0.0–1.9)

## 2023-08-07 LAB — COMPREHENSIVE METABOLIC PANEL WITH GFR
ALT: 17 U/L (ref 0–35)
AST: 18 U/L (ref 0–37)
Albumin: 4 g/dL (ref 3.5–5.2)
Alkaline Phosphatase: 73 U/L (ref 39–117)
BUN: 13 mg/dL (ref 6–23)
CO2: 29 meq/L (ref 19–32)
Calcium: 8.8 mg/dL (ref 8.4–10.5)
Chloride: 103 meq/L (ref 96–112)
Creatinine, Ser: 0.95 mg/dL (ref 0.40–1.20)
GFR: 64.21 mL/min (ref >60.00)
Glucose, Bld: 82 mg/dL (ref 70–99)
Potassium: 4 meq/L (ref 3.5–5.1)
Sodium: 143 meq/L (ref 135–145)
Total Bilirubin: 0.8 mg/dL (ref 0.2–1.2)
Total Protein: 6.6 g/dL (ref 6.0–8.3)

## 2023-08-07 LAB — HEMOGLOBIN A1C: Hgb A1c MFr Bld: 5.7 % (ref 4.6–6.5)

## 2023-08-10 ENCOUNTER — Encounter: Payer: Self-pay | Admitting: Family Medicine

## 2023-08-10 ENCOUNTER — Ambulatory Visit (INDEPENDENT_AMBULATORY_CARE_PROVIDER_SITE_OTHER): Admitting: Family Medicine

## 2023-08-10 DIAGNOSIS — M170 Bilateral primary osteoarthritis of knee: Secondary | ICD-10-CM | POA: Diagnosis not present

## 2023-08-10 NOTE — Patient Instructions (Addendum)
 Start physical therapy and do home exercises on days you don't go to therapy. If the pain becomes more localized in your knees despite therapy we can consider the injections again. Your pain is due to arthritis. These are the different medications you can take for this: Tylenol 500mg  1-2 tabs three times a day for pain. Voltaren gel, capsaicin, aspercreme, or biofreeze topically up to four times a day may also help with pain. Some supplements that may help for arthritis: Boswellia extract, curcumin, pycnogenol Aleve 1-2 tabs twice a day with food Follow up with me in 6 weeks.  [facility] [address] [place], [place] [address] Ph: [phone] Fax: [phone]

## 2023-08-10 NOTE — Progress Notes (Signed)
 PCP: Donato Schultz, DO  Subjective:   HPI: Patient is a 63 y.o. female here for bilateral knee pain.  Patient reports overall she's doing well. Main issue is surrounding pain she gets more in her legs throughout emanating from the knees. If sits for over an hour driving knees will lock up and be stiff - feels more in calves and hamstring areas. Only mild discomfort currently.  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   GERD (gastroesophageal reflux disease)    History of colon polyps    Hyperlipidemia    Hypertension    Joint pain    Lactose intolerance    Morbid obesity - BMI > 50 12/13/2012   Pneumonia    none recent   PONV (postoperative nausea and vomiting)    Poor venous access    "usually difficult stick"   Prediabetes    Sinusitis    SOB (shortness of breath)    Swelling    Vitamin D deficiency     Current Outpatient Medications on File Prior to Visit  Medication Sig Dispense Refill   amLODipine (NORVASC) 2.5 MG tablet Take 1 tablet (2.5 mg total) by mouth 2 (two) times daily. 180 tablet 3   carvedilol (COREG) 3.125 MG tablet Take 1 tablet (3.125 mg total) by mouth 2 (two) times daily with a meal. 180 tablet 3   fluticasone (FLONASE) 50 MCG/ACT nasal spray Place 1 spray into both nostrils 2 (two) times daily as needed for allergies or rhinitis. 48 g 1   furosemide (LASIX) 40 MG tablet TAKE 1 TABLET BY MOUTH DAILY 90 tablet 1   glucose blood test strip Check blood sugar twice daily 100 each 11   meloxicam (MOBIC) 15 MG tablet TAKE 1/2 TO 1 TABLET BY MOUTH EVERY DAY AS NEEDED 30 tablet 2   Multiple  Vitamins-Minerals (MULTIVITAMIN ADULT PO) Take 1 tablet by mouth daily. flintstone vitamins     RABEprazole (ACIPHEX) 20 MG tablet Take 1 tablet (20 mg total) by mouth daily. 90 tablet 3   tirzepatide (MOUNJARO) 10 MG/0.5ML Pen Inject 10 mg into the skin once a week. 6 mL 2   Vitamin D, Ergocalciferol, (DRISDOL) 1.25 MG (50000 UNIT) CAPS capsule Take 1 capsule (50,000 Units total) by mouth every 7 (seven) days. 12 capsule 1   No current facility-administered medications on file prior to visit.    Past Surgical History:  Procedure Laterality Date   ABDOMINAL HYSTERECTOMY     APPENDECTOMY     2'14   COLONOSCOPY  04/11/11   5 mm polyp removed but not recovered   COLONOSCOPY WITH PROPOFOL N/A 05/11/2017   Procedure: COLONOSCOPY WITH PROPOFOL;  Surgeon: Iva Boop, MD;  Location: [facility];  Service: Endoscopy;  Laterality: N/A;   GASTRIC ROUX-EN-Y N/A 02/27/2015   Procedure: LAPAROSCOPIC ROUX-EN-Y GASTRIC BYPASS WITH UPPER ENDOSCOPY;  Surgeon: Luretha Murphy, MD;  Location: [facility];  Service: General;  Laterality: N/A;   KNEE SURGERY Right     KNEE SURGERY    LAPAROSCOPIC APPENDECTOMY  06/10/2012  Procedure: APPENDECTOMY LAPAROSCOPIC;  Surgeon: Ardeth Sportsman, MD;  Location: [facility];  Service: General;  Laterality: N/A;   UPPER GASTROINTESTINAL ENDOSCOPY  04/14/2006   Normal    Allergies  Allergen Reactions   Ace Inhibitors Anaphylaxis    Bowel angioedema   Cheese Nausea And Vomiting   Red Dye #40 (Allura Red) Nausea And Vomiting   Tuna [Fish Allergy] Nausea And Vomiting    BP 122/80   Ht 5\' 5"  (1.651 m)   Wt (!) 313 lb (142 kg)   BMI 52.09 kg/m       No data to display              No data to display              Objective:  Physical Exam:  Gen: NAD, comfortable in exam room  Bilateral knees: No gross deformity, ecchymoses, swelling. TTP mildly bilateral gastroc muscles, hamstrings.  No quad tenderness.  Minimal medial joint line tenderness. FROM with  normal strength. Negative ant/post drawers. Negative valgus/varus testing. NV intact distally.   Assessment & Plan:  1. Bilateral knee pain - due to arthritis primarily though current issue more related to musculature lower extremities.  Should improve with strengthening - start physical therapy.  Tylenol, topical medications, heat, aleve if needed.  Follow up in 6 weeks.

## 2023-08-16 ENCOUNTER — Encounter: Payer: Self-pay | Admitting: Family Medicine

## 2023-08-27 ENCOUNTER — Ambulatory Visit

## 2023-09-02 ENCOUNTER — Ambulatory Visit (INDEPENDENT_AMBULATORY_CARE_PROVIDER_SITE_OTHER)

## 2023-09-02 DIAGNOSIS — I10 Essential (primary) hypertension: Secondary | ICD-10-CM

## 2023-09-02 MED ORDER — TIRZEPATIDE 10 MG/0.5ML ~~LOC~~ SOAJ: 10.0000 mg | SUBCUTANEOUS | 2 refills | Status: DC

## 2023-09-02 NOTE — Progress Notes (Signed)
 Pt here for Blood pressure check per [place]  Pt currently takes: Amlodipine  2.5mg  BID, Carvedilol  3.125mg  BID   Pt reports compliance with medication.  BP today @ = 138/80 HR = 58    BP recheck-- 130/80  Pt advised per [place] no changes if bp was at or below 130s. Pt advised of hydration, salt intake,and increased activity. I also advised to check bps at home twice a day and to send records for Dr. Jalene Mayor to review.

## 2023-09-25 ENCOUNTER — Ambulatory Visit (INDEPENDENT_AMBULATORY_CARE_PROVIDER_SITE_OTHER): Admitting: Family Medicine

## 2023-09-25 ENCOUNTER — Ambulatory Visit (HOSPITAL_BASED_OUTPATIENT_CLINIC_OR_DEPARTMENT_OTHER)
Admission: RE | Admit: 2023-09-25 | Discharge: 2023-09-25 | Disposition: A | Discharge status: Home / Self Care | Source: Ambulatory Visit | Attending: Family Medicine | Admitting: Family Medicine

## 2023-09-25 ENCOUNTER — Encounter: Payer: Self-pay | Admitting: Family Medicine

## 2023-09-25 VITALS — BP 118/78 | HR 75 | Temp 97.6°F | Resp 18 | Ht 65.0 in | Wt 311.6 lb

## 2023-09-25 DIAGNOSIS — J302 Other seasonal allergic rhinitis: Secondary | ICD-10-CM

## 2023-09-25 DIAGNOSIS — M79651 Pain in right thigh: Secondary | ICD-10-CM | POA: Diagnosis present

## 2023-09-25 MED ORDER — VALACYCLOVIR HCL 1 G PO TABS: 1000.0000 mg | ORAL_TABLET | Freq: Three times a day (TID) | ORAL | 0 refills | Status: DC

## 2023-09-25 MED ORDER — FLUTICASONE PROPIONATE 50 MCG/ACT NA SUSP
1.0000 | Freq: Two times a day (BID) | NASAL | 1 refills | Status: DC | PRN
Start: 2023-09-25 — End: 2024-02-23

## 2023-09-25 NOTE — Progress Notes (Signed)
 Established Patient Office Visit  Subjective   Patient ID: Tammy [name], female    [name]: [birth year]  Age: 63 y.o. MRN: [id number]  Chief Complaint  Patient presents with   Leg Pain    Right thigh, x2 weeks, pain with walking, some swelling and heat    HPI Discussed the use of AI scribe software for clinical note transcription with the patient, who gave verbal consent to proceed.  History of Present Illness Tammy [name] is a 63 year old female who presents with a stinging and burning sensation in her right thigh.  She has been experiencing a stinging and burning sensation in her right thigh for approximately one week. The sensation began while she was sitting at work and felt like she was 'bit by something.' The pain has started to radiate down her leg and worsens with contact, such as clothing rubbing against it. She avoids touching the area due to discomfort. No rash or blisters have been observed, but the area has darkened over time. She occasionally uses a hot towel for relief and finds that lotion cools the area.  She mentions small brown spots on her skin, which she associates with her family history, as her aunt had similar spots. These spots do not cause pain or discomfort.  She has a significant occupational history of exposure to chemicals, including hydrochloric acid, over the past 37 years. Despite using protective gear such as suits and masks, she is concerned about the potential long-term effects of this exposure. She regularly monitors her health with lab tests, which have shown normal liver function.  She has been actively managing her weight, having lost nearly 40 pounds over the past year and a half, with a current weight of 311 pounds. She aims to reach 300 pounds by her birthday in October. She uses a pump at home to manage leg swelling, which occurs if she is on her feet for extended periods. She has a history of using compression therapy to manage this  swelling.   Patient Active Problem List   Diagnosis Date Noted   Pain of right thigh 09/25/2023   Cervical radiculopathy 02/09/2023   Gastroesophageal reflux disease 08/14/2022   Claustrophobia 08/14/2022   Myalgia 08/14/2022   Bronchitis due to COVID-19 virus 01/14/2022   Acute left-sided low back pain without sciatica 11/28/2021   Bronchitis 09/26/2021   Multiple atypical skin moles 06/21/2021   Vertigo 06/21/2021   Acute cough 04/09/2021   History of pneumonia 04/09/2021   Pneumonia due to infectious organism 03/12/2021   Preventative health care 02/15/2021   Osteoarthritis of left knee 12/12/2020   Primary osteoarthritis of right knee 12/12/2020   Low back pain with radiation 03/14/2020   Hyperlipidemia 04/07/2019   Type 2 diabetes mellitus with hyperglycemia, without long-term current use of insulin  (HCC) 04/07/2019   Neck pain 04/07/2019   Chronic seasonal allergic rhinitis due to pollen 04/07/2019   Lymphedema 03/04/2019   Edema due to malnutrition (HCC) 10/10/2018   Class 3 severe obesity with serious comorbidity and body mass index (BMI) of 50.0 to 59.9 in adult 08/30/2018   Lower extremity edema 07/04/2015   Hoarseness 06/15/2015   Bronchospasm with bronchitis, acute 05/24/2015   Elevated CA-125 03/16/2015   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 02/27/2015   Enteritis 01/25/2015   Ascites 01/25/2015   Abdominal pain 01/22/2015   Ovarian cyst, left 01/03/2015   Allergic rhinitis 12/21/2013   Right ankle pain 11/10/2013   Morbid obesity -  BMI > 50 12/13/2012   Chronic vomiting 12/13/2012   Lactose intolerance 10/22/2012   Left knee pain 07/26/2012   Bacterial vaginosis 06/10/2012   Bradycardia, sinus 03/28/2012   Central positional vertigo 03/27/2012   Tongue swelling 08/21/2011   Urgency of urination 05/14/2011   History of colonic polyps 04/11/2011   Otalgia 11/14/2010   OTHER TENOSYNOVITIS OF HAND AND WRIST 07/19/2010   DYSPEPSIA 10/17/2009   BACK PAIN,  LUMBAR, WITH RADICULOPATHY 12/27/2008   VITAMIN B12 DEFICIENCY 09/29/2008   SYNCOPE 08/21/2008   ANKLE EDEMA, CHRONIC 12/16/2006   DM (diabetes mellitus) type II, controlled, with peripheral vascular disorder (HCC) 11/09/2006   Hyperlipidemia LDL goal <70 11/09/2006   Essential hypertension 05/26/2006   GERD 05/26/2006   Past Medical History:  Diagnosis Date   Acute on chronic appendicitis s/p lap appy 06/10/2012 06/10/2012   surgery done 06-10-12   Angioedema of intestine due to angiotensin converting enzyme inhibitor (ACE-I)    Arthritis    ankle   Back pain    Chest pain    Complication of anesthesia    Constipation    Diabetes mellitus    TYPE 2- no meds in several months-was taken off meds -monitoring blood sugar.   GERD (gastroesophageal reflux disease)    History of colon polyps    Hyperlipidemia    Hypertension    Joint pain    Lactose intolerance    Morbid obesity - BMI > 50 12/13/2012   Pneumonia    none recent   PONV (postoperative nausea and vomiting)    Poor venous access    "usually difficult stick"   Prediabetes    Sinusitis    SOB (shortness of breath)    Swelling    Vitamin D  deficiency    Past Surgical History:  Procedure Laterality Date   ABDOMINAL HYSTERECTOMY     APPENDECTOMY     2'14   COLONOSCOPY  04/11/11   5 mm polyp removed but not recovered   COLONOSCOPY WITH PROPOFOL  N/A 05/11/2017   Procedure: COLONOSCOPY WITH PROPOFOL ;  Surgeon: Kenney Peacemaker, MD;  Location: [facility];  Service: Endoscopy;  Laterality: N/A;   GASTRIC ROUX-EN-Y N/A 02/27/2015   Procedure: LAPAROSCOPIC ROUX-EN-Y GASTRIC BYPASS WITH UPPER ENDOSCOPY;  Surgeon: Jacolyn Matar, MD;  Location: [facility];  Service: General;  Laterality: N/A;   KNEE SURGERY Right     KNEE SURGERY    LAPAROSCOPIC APPENDECTOMY  06/10/2012   Procedure: APPENDECTOMY LAPAROSCOPIC;  Surgeon: Eddye Goodie, MD;  Location: [facility];  Service: General;  Laterality: N/A;   UPPER GASTROINTESTINAL ENDOSCOPY   04/14/2006   Normal   Social History   Tobacco Use   Smoking status: Never   Smokeless tobacco: Never  Vaping Use   Vaping status: Never Used  Substance Use Topics   Alcohol use: No    Alcohol/week: 0.0 standard drinks of alcohol   Drug use: No   Social History   Socioeconomic History   Marital status: Married    Spouse name: Engineer, civil (consulting)   Number of children: 0   Years of education: Not on file   Highest education level: Not on file  Occupational History   Occupation: FAB Set designer: [employer]  Tobacco Use   Smoking status: Never   Smokeless tobacco: Never  Vaping Use   Vaping status: Never Used  Substance and Sexual Activity   Alcohol use: No    Alcohol/week: 0.0 standard drinks of alcohol  Drug use: No   Sexual activity: Not on file  Other Topics Concern   Not on file  Social History Narrative   Married, works at [employer]   No children   No alcohol tobacco or drug use   Social Drivers of Corporate investment banker Strain: Not on file  Food Insecurity: Not on file  Transportation Needs: Not on file  Physical Activity: Not on file  Stress: Not on file  Social Connections: Unknown (12/10/2021)   Received from [facility], [facility]   Social Network    Social Network: Not on file  Intimate Partner Violence: Unknown (12/10/2021)   Received from [facility], [facility]   HITS    Physically Hurt: Not on file    Insult or Talk Down To: Not on file    Threaten Physical Harm: Not on file    Scream or Curse: Not on file   Family Status  Relation Name Status   Mother  Deceased   Father  Deceased at age 33   Brother  Alive   Brother  Alive   Brother  Alive   Brother  Alive   PGM  (Not Specified)   Garment/textile technologist Aunt  Deceased   Pat Aunt  Deceased   Cousin  Deceased   Neg Hx  (Not Specified)  No partnership data on file   Family History  Problem Relation Age of Onset   Hyperlipidemia Mother    Hypertension Mother    Cancer Mother 67        hodgkins lymphoma   Diabetes Father    Heart failure Father    Heart disease Father    Hyperlipidemia Brother    Diabetes Paternal Grandmother    Breast cancer Paternal Aunt        unsure of age   Breast cancer Paternal Aunt    Heart disease Cousin    Colon cancer Neg Hx    Heart attack Neg Hx    Sudden death Neg Hx    Colon polyps Neg Hx    Esophageal cancer Neg Hx    Stomach cancer Neg Hx    Rectal cancer Neg Hx    Allergies  Allergen Reactions   Ace Inhibitors Anaphylaxis    Bowel angioedema   Cheese Nausea And Vomiting   Red Dye #40 (Allura Red) Nausea And Vomiting   Tuna [Fish Allergy ] Nausea And Vomiting      Review of Systems  Constitutional:  Negative for chills, fever and malaise/fatigue.  HENT:  Negative for congestion and hearing loss.   Eyes:  Negative for blurred vision and discharge.  Respiratory:  Negative for cough, sputum production and shortness of breath.   Cardiovascular:  Negative for chest pain, palpitations and leg swelling.  Gastrointestinal:  Negative for abdominal pain, blood in stool, constipation, diarrhea, heartburn, nausea and vomiting.  Genitourinary:  Negative for dysuria, frequency, hematuria and urgency.  Musculoskeletal:  Negative for back pain, falls and myalgias.  Skin:  Negative for rash.  Neurological:  Positive for sensory change. Negative for dizziness, loss of consciousness, weakness and headaches.  Endo/Heme/Allergies:  Negative for environmental allergies. Does not bruise/bleed easily.  Psychiatric/Behavioral:  Negative for depression and suicidal ideas. The patient is not nervous/anxious and does not have insomnia.       Objective:     BP 118/78 (BP Location: Left Arm, Patient Position: Sitting, Cuff Size: Large)   Pulse 75   Temp 97.6 F (36.4 C) (Oral)   Resp  18   Ht 5\' 5"  (1.651 m)   Wt (!) 311 lb 9.6 oz (141.3 kg)   SpO2 98%   BMI 51.85 kg/m  BP Readings from Last 3 Encounters:  09/25/23 118/78   09/02/23 130/80  08/10/23 122/80   Wt Readings from Last 3 Encounters:  09/25/23 (!) 311 lb 9.6 oz (141.3 kg)  08/10/23 (!) 313 lb (142 kg)  08/06/23 (!) 313 lb (142 kg)   SpO2 Readings from Last 3 Encounters:  09/25/23 98%  08/06/23 99%  02/09/23 99%      Physical Exam Vitals and nursing note reviewed.  Constitutional:      General: She is not in acute distress.    Appearance: Normal appearance. She is well-developed.  HENT:     Head: Normocephalic and atraumatic.  Eyes:     General: No scleral icterus.       Right eye: No discharge.        Left eye: No discharge.  Cardiovascular:     Rate and Rhythm: Normal rate and regular rhythm.     Heart sounds: No murmur heard. Pulmonary:     Effort: Pulmonary effort is normal. No respiratory distress.     Breath sounds: Normal breath sounds.  Musculoskeletal:        General: Normal range of motion.     Cervical back: Normal range of motion and neck supple.     Right lower leg: No edema.     Left lower leg: No edema.  Skin:    General: Skin is warm and dry.          Comments: Veins visible ant thigh Pain with light touch   Neurological:     Mental Status: She is alert and oriented to person, place, and time.  Psychiatric:        Mood and Affect: Mood normal.        Behavior: Behavior normal.        Thought Content: Thought content normal.        Judgment: Judgment normal.      No results found for any visits on 09/25/23.  Last CBC Lab Results  Component Value Date   WBC 2.5 (L) 08/06/2023   HGB 12.3 08/06/2023   HCT 37.2 08/06/2023   MCV 87.4 08/06/2023   MCH 28.4 10/08/2018   RDW 14.7 08/06/2023   PLT 257.0 08/06/2023   Last metabolic panel Lab Results  Component Value Date   GLUCOSE 82 08/06/2023   NA 143 08/06/2023   K 4.0 08/06/2023   CL 103 08/06/2023   CO2 29 08/06/2023   BUN 13 08/06/2023   CREATININE 0.95 08/06/2023   GFR 64.21 08/06/2023   CALCIUM  8.8 08/06/2023   PROT 6.6 08/06/2023    ALBUMIN 4.0 08/06/2023   LABGLOB 2.7 06/03/2018   AGRATIO 1.6 06/03/2018   BILITOT 0.8 08/06/2023   ALKPHOS 73 08/06/2023   AST 18 08/06/2023   ALT 17 08/06/2023   ANIONGAP 5 04/22/2018   Last lipids Lab Results  Component Value Date   CHOL 185 08/06/2023   HDL 70.60 08/06/2023   LDLCALC 103 (H) 08/06/2023   LDLDIRECT 129.1 10/17/2009   TRIG 57.0 08/06/2023   CHOLHDL 3 08/06/2023   Last hemoglobin A1c Lab Results  Component Value Date   HGBA1C 5.7 08/06/2023   Last thyroid  functions Lab Results  Component Value Date   TSH 2.13 02/09/2023   T3TOTAL 86 06/03/2018   Last vitamin D  Lab Results  Component Value  Date   VD25OH 10.21 (L) 07/10/2022   Last vitamin B12 and Folate Lab Results  Component Value Date   VITAMINB12 547 06/03/2018   FOLATE 12.3 06/03/2018      The 10-year ASCVD risk score (Arnett DK, et al., 2019) is: 12%    Assessment & Plan:   Problem List Items Addressed This Visit       Unprioritized   Pain of right thigh - Primary   ? Shingles --- no rash but significant pain R thigh Valtrex rx Check venous us   If no relief and us  neg---  refer to vascular       Relevant Orders   US  Venous Img Lower Unilateral Right (DVT)   Other Visit Diagnoses       Seasonal allergies       Relevant Medications   fluticasone  (FLONASE ) 50 MCG/ACT nasal spray       No follow-ups on file.    Tylesha Gibeault R Lowne Chase, DO

## 2023-09-25 NOTE — Assessment & Plan Note (Signed)
?   Shingles --- no rash but significant pain R thigh Valtrex rx Check venous us   If no relief and us  neg---  refer to vascular

## 2023-09-29 ENCOUNTER — Ambulatory Visit: Payer: Self-pay | Admitting: Family Medicine

## 2023-10-06 ENCOUNTER — Ambulatory Visit: Admitting: Family Medicine

## 2023-10-06 ENCOUNTER — Encounter: Payer: Self-pay | Admitting: Family Medicine

## 2023-10-06 ENCOUNTER — Encounter: Payer: Self-pay | Admitting: Internal Medicine

## 2023-10-06 VITALS — BP 152/78 | HR 74 | Resp 18 | Ht 65.0 in | Wt 315.2 lb

## 2023-10-06 DIAGNOSIS — G8929 Other chronic pain: Secondary | ICD-10-CM

## 2023-10-06 DIAGNOSIS — I83891 Varicose veins of right lower extremities with other complications: Secondary | ICD-10-CM | POA: Insufficient documentation

## 2023-10-06 MED ORDER — CYCLOBENZAPRINE HCL 10 MG PO TABS
10.0000 mg | ORAL_TABLET | Freq: Three times a day (TID) | ORAL | 0 refills | Status: DC | PRN
Start: 2023-10-06 — End: 2023-10-12

## 2023-10-06 MED ORDER — TRAMADOL HCL 50 MG PO TABS
50.0000 mg | ORAL_TABLET | Freq: Three times a day (TID) | ORAL | 0 refills | Status: AC | PRN
Start: 2023-10-06 — End: 2023-10-11

## 2023-10-06 MED ORDER — KETOROLAC TROMETHAMINE 60 MG/2ML IM SOLN
60.0000 mg | Freq: Once | INTRAMUSCULAR | Status: AC
  Administered 2023-10-06: 60 mg via INTRAMUSCULAR

## 2023-10-06 NOTE — Assessment & Plan Note (Signed)
 Will try to get her in to see sport med sooner  Toradol  I'm given in office

## 2023-10-06 NOTE — Patient Instructions (Signed)

## 2023-10-06 NOTE — Progress Notes (Signed)
 Established Patient Office Visit  Subjective   Patient ID: Tammy [name], female    [name]: 26-Jan-1961  Age: 63 y.o. MRN: [id number]  Chief Complaint  Patient presents with   Follow-up    Right thigh , unchanged     HPI Discussed the use of AI scribe software for clinical note transcription with the patient, who gave verbal consent to proceed.  History of Present Illness Tammy [name] is a 62 year old female who presents with bilateral leg pain and heaviness.  She experiences bilateral leg pain and heaviness, describing her legs as feeling heavy, like 'weights are tied to my knees.' The heaviness is primarily located just above the knees, causing difficulty in lifting her legs and requiring assistance to get into a car. The pain is constant, present even at rest, and worsens with movement.  She reports tightness and aching in the back of her calves, describing it as feeling like 'knots.' The pain is associated with swelling, particularly in one leg, which is larger than the other. No recent falls or injuries. She experiences significant sleep disturbances due to the pain, sleeping only a few hours each night. Tylenol  has been tried for pain relief but is insufficient. The pain is exacerbated by wearing tight clothing due to swelling.  She has a history of shingles and was previously prescribed medication for it, which alleviated the stinging sensation but did not resolve the heaviness in her legs. She is currently taking vitamin D  and cod liver oil, which she has been on for six months, and she avoids taking additional medications unless necessary.  She notes a history of receiving gel shots in her knees, which she found painful, and she has visible veins on one leg, which she initially thought were a rash. The veins do not burn anymore after treatment, but the area still hurts.  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   Patient Active  Problem List   Diagnosis Date Noted   Symptomatic varicose veins, right 10/06/2023   Pain of right thigh 09/25/2023   Cervical radiculopathy 02/09/2023   Gastroesophageal reflux disease 08/14/2022   Claustrophobia 08/14/2022   Myalgia 08/14/2022   Bronchitis due to COVID-19 virus 01/14/2022   Acute left-sided low back pain without sciatica 11/28/2021   Bronchitis 09/26/2021   Multiple atypical skin moles 06/21/2021   Vertigo 06/21/2021   Acute cough 04/09/2021   History of pneumonia 04/09/2021   Pneumonia due to infectious organism 03/12/2021   Preventative health care 02/15/2021   Osteoarthritis of left knee 12/12/2020   Primary osteoarthritis of right knee 12/12/2020   Low back pain with radiation 03/14/2020   Hyperlipidemia 04/07/2019   Type 2 diabetes mellitus with hyperglycemia, without long-term current use of insulin  (HCC) 04/07/2019   Neck pain 04/07/2019   Chronic seasonal allergic rhinitis due to pollen 04/07/2019   Lymphedema 03/04/2019   Edema due to malnutrition (HCC) 10/10/2018   Class 3 severe obesity with serious comorbidity and body mass index (BMI) of 50.0 to 59.9 in adult 08/30/2018   Lower extremity edema 07/04/2015   Hoarseness 06/15/2015   Bronchospasm with bronchitis, acute 05/24/2015   Elevated CA-125 03/16/2015   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 02/27/2015   Enteritis 01/25/2015   Ascites 01/25/2015   Abdominal pain 01/22/2015   Ovarian cyst, left 01/03/2015   Allergic rhinitis 12/21/2013   Right ankle pain 11/10/2013   Morbid obesity - BMI > 50  12/13/2012   Chronic vomiting 12/13/2012   Lactose intolerance 10/22/2012   Chronic pain of both knees 07/26/2012   Bacterial vaginosis 06/10/2012   Bradycardia, sinus 03/28/2012   Central positional vertigo 03/27/2012   Tongue swelling 08/21/2011   Urgency of urination 05/14/2011   History of colonic polyps 04/11/2011   Otalgia 11/14/2010   OTHER TENOSYNOVITIS OF HAND AND WRIST 07/19/2010    DYSPEPSIA 10/17/2009   BACK PAIN, LUMBAR, WITH RADICULOPATHY 12/27/2008   VITAMIN B12 DEFICIENCY 09/29/2008   SYNCOPE 08/21/2008   ANKLE EDEMA, CHRONIC 12/16/2006   DM (diabetes mellitus) type II, controlled, with peripheral vascular disorder (HCC) 11/09/2006   Hyperlipidemia LDL goal <70 11/09/2006   Essential hypertension 05/26/2006   GERD 05/26/2006   Past Medical History:  Diagnosis Date   Acute on chronic appendicitis s/p lap appy 06/10/2012 06/10/2012   surgery done 06-10-12   Angioedema of intestine due to angiotensin converting enzyme inhibitor (ACE-I)    Arthritis    ankle   Back pain    Chest pain    Complication of anesthesia    Constipation    Diabetes mellitus    TYPE 2- no meds in several months-was taken off meds -monitoring blood sugar.   GERD (gastroesophageal reflux disease)    History of colon polyps    Hyperlipidemia    Hypertension    Joint pain    Lactose intolerance    Morbid obesity - BMI > 50 12/13/2012   Pneumonia    none recent   PONV (postoperative nausea and vomiting)    Poor venous access    "usually difficult stick"   Prediabetes    Sinusitis    SOB (shortness of breath)    Swelling    Vitamin D  deficiency    Past Surgical History:  Procedure Laterality Date   ABDOMINAL HYSTERECTOMY     APPENDECTOMY     2'14   COLONOSCOPY  04/11/11   5 mm polyp removed but not recovered   COLONOSCOPY WITH PROPOFOL  N/A 05/11/2017   Procedure: COLONOSCOPY WITH PROPOFOL ;  Surgeon: Kenney Peacemaker, MD;  Location: [facility];  Service: Endoscopy;  Laterality: N/A;   GASTRIC ROUX-EN-Y N/A 02/27/2015   Procedure: LAPAROSCOPIC ROUX-EN-Y GASTRIC BYPASS WITH UPPER ENDOSCOPY;  Surgeon: Jacolyn Matar, MD;  Location: [facility];  Service: General;  Laterality: N/A;   KNEE SURGERY Right     KNEE SURGERY    LAPAROSCOPIC APPENDECTOMY  06/10/2012   Procedure: APPENDECTOMY LAPAROSCOPIC;  Surgeon: Eddye Goodie, MD;  Location: [facility];  Service: General;  Laterality: N/A;    UPPER GASTROINTESTINAL ENDOSCOPY  04/14/2006   Normal   Social History   Tobacco Use   Smoking status: Never   Smokeless tobacco: Never  Vaping Use   Vaping status: Never Used  Substance Use Topics   Alcohol use: No    Alcohol/week: 0.0 standard drinks of alcohol   Drug use: No   Social History   Socioeconomic History   Marital status: Married    Spouse name: Engineer, civil (consulting)   Number of children: 0   Years of education: Not on file   Highest education level: Not on file  Occupational History   Occupation: FAB Set designer: [employer]  Tobacco Use   Smoking status: Never   Smokeless tobacco: Never  Vaping Use   Vaping status: Never Used  Substance and Sexual Activity   Alcohol use: No    Alcohol/week: 0.0 standard drinks of alcohol   Drug  use: No   Sexual activity: Not on file  Other Topics Concern   Not on file  Social History Narrative   Married, works at [employer]   No children   No alcohol tobacco or drug use   Social Drivers of Corporate investment banker Strain: Not on file  Food Insecurity: Not on file  Transportation Needs: Not on file  Physical Activity: Not on file  Stress: Not on file  Social Connections: Unknown (12/10/2021)   Received from [facility]   Social Network    Social Network: Not on file  Intimate Partner Violence: Unknown (12/10/2021)   Received from [facility], [facility]   HITS    Physically Hurt: Not on file    Insult or Talk Down To: Not on file    Threaten Physical Harm: Not on file    Scream or Curse: Not on file   Family Status  Relation Name Status   Mother  Deceased   Father  Deceased at age 64   Brother  Alive   Brother  Alive   Brother  Alive   Brother  Alive   PGM  (Not Specified)   Garment/textile technologist Aunt  Deceased   Pat Aunt  Deceased   Cousin  Deceased   Neg Hx  (Not Specified)  No partnership data on file   Family History  Problem Relation Age of Onset   Hyperlipidemia Mother     Hypertension Mother    Cancer Mother 56       hodgkins lymphoma   Diabetes Father    Heart failure Father    Heart disease Father    Hyperlipidemia Brother    Diabetes Paternal Grandmother    Breast cancer Paternal Aunt        unsure of age   Breast cancer Paternal Aunt    Heart disease Cousin    Colon cancer Neg Hx    Heart attack Neg Hx    Sudden death Neg Hx    Colon polyps Neg Hx    Esophageal cancer Neg Hx    Stomach cancer Neg Hx    Rectal cancer Neg Hx    Allergies  Allergen Reactions   Ace Inhibitors Anaphylaxis    Bowel angioedema   Cheese Nausea And Vomiting   Red Dye #40 (Allura Red) Nausea And Vomiting   Tuna [Fish Allergy ] Nausea And Vomiting      Review of Systems  Constitutional:  Negative for fever and malaise/fatigue.  HENT:  Negative for congestion.   Eyes:  Negative for blurred vision.  Respiratory:  Negative for shortness of breath.   Cardiovascular:  Negative for chest pain, palpitations and leg swelling.  Gastrointestinal:  Negative for abdominal pain, blood in stool and nausea.  Genitourinary:  Negative for dysuria and frequency.  Musculoskeletal:  Positive for joint pain. Negative for falls.  Skin:  Negative for rash.  Neurological:  Positive for sensory change. Negative for dizziness, loss of consciousness and headaches.  Endo/Heme/Allergies:  Negative for environmental allergies.  Psychiatric/Behavioral:  Negative for depression. The patient is not nervous/anxious.       Objective:     BP (!) 152/78   Pulse 74   Resp 18   Ht 5\' 5"  (1.651 m)   Wt (!) 315 lb 3.2 oz (143 kg)   SpO2 96%   BMI 52.45 kg/m  BP Readings from Last 3 Encounters:  10/06/23 (!) 152/78  09/25/23 118/78  09/02/23 130/80   Wt  Readings from Last 3 Encounters:  10/06/23 (!) 315 lb 3.2 oz (143 kg)  09/25/23 (!) 311 lb 9.6 oz (141.3 kg)  08/10/23 (!) 313 lb (142 kg)   SpO2 Readings from Last 3 Encounters:  10/06/23 96%  09/25/23 98%  08/06/23 99%       Physical Exam Vitals and nursing note reviewed.  Constitutional:      General: She is not in acute distress.    Appearance: Normal appearance. She is well-developed.  HENT:     Head: Normocephalic and atraumatic.  Eyes:     General: No scleral icterus.       Right eye: No discharge.        Left eye: No discharge.  Cardiovascular:     Rate and Rhythm: Normal rate and regular rhythm.     Heart sounds: No murmur heard. Pulmonary:     Effort: Pulmonary effort is normal. No respiratory distress.     Breath sounds: Normal breath sounds.  Musculoskeletal:        General: Tenderness present. Normal range of motion.     Cervical back: Normal range of motion and neck supple.     Right knee: Swelling present. Tenderness present over the medial joint line and lateral joint line.     Left knee: Swelling present. Tenderness present over the medial joint line and lateral joint line.     Right lower leg: No edema.     Left lower leg: No edema.  Skin:    General: Skin is warm and dry.          Comments: Varicose veins R lat thigh--- tender to touch   Neurological:     Mental Status: She is alert and oriented to person, place, and time.  Psychiatric:        Mood and Affect: Mood normal.        Behavior: Behavior normal.        Thought Content: Thought content normal.        Judgment: Judgment normal.      No results found for any visits on 10/06/23.  Last CBC Lab Results  Component Value Date   WBC 2.5 (L) 08/06/2023   HGB 12.3 08/06/2023   HCT 37.2 08/06/2023   MCV 87.4 08/06/2023   MCH 28.4 10/08/2018   RDW 14.7 08/06/2023   PLT 257.0 08/06/2023   Last metabolic panel Lab Results  Component Value Date   GLUCOSE 82 08/06/2023   NA 143 08/06/2023   K 4.0 08/06/2023   CL 103 08/06/2023   CO2 29 08/06/2023   BUN 13 08/06/2023   CREATININE 0.95 08/06/2023   GFR 64.21 08/06/2023   CALCIUM  8.8 08/06/2023   PROT 6.6 08/06/2023   ALBUMIN 4.0 08/06/2023   LABGLOB 2.7  06/03/2018   AGRATIO 1.6 06/03/2018   BILITOT 0.8 08/06/2023   ALKPHOS 73 08/06/2023   AST 18 08/06/2023   ALT 17 08/06/2023   ANIONGAP 5 04/22/2018   Last lipids Lab Results  Component Value Date   CHOL 185 08/06/2023   HDL 70.60 08/06/2023   LDLCALC 103 (H) 08/06/2023   LDLDIRECT 129.1 10/17/2009   TRIG 57.0 08/06/2023   CHOLHDL 3 08/06/2023   Last hemoglobin A1c Lab Results  Component Value Date   HGBA1C 5.7 08/06/2023   Last thyroid  functions Lab Results  Component Value Date   TSH 2.13 02/09/2023   T3TOTAL 86 06/03/2018   Last vitamin D  Lab Results  Component Value Date   VD25OH  10.21 (L) 07/10/2022   Last vitamin B12 and Folate Lab Results  Component Value Date   VITAMINB12 547 06/03/2018   FOLATE 12.3 06/03/2018      The 10-year ASCVD risk score (Arnett DK, et al., 2019) is: 22.5%    Assessment & Plan:   Problem List Items Addressed This Visit       Unprioritized   Symptomatic varicose veins, right   Relevant Medications   traMADol  (ULTRAM ) 50 MG tablet   Other Relevant Orders   Ambulatory referral to Vascular Surgery   Chronic pain of both knees - Primary   Will try to get her in to see sport med sooner  Toradol  I'm given in office       Relevant Medications   traMADol  (ULTRAM ) 50 MG tablet   cyclobenzaprine  (FLEXERIL ) 10 MG tablet   Other Relevant Orders   Ambulatory referral to Sports Medicine  Assessment and Plan Assessment & Plan Leg Pain   She experiences bilateral leg pain with heaviness and difficulty lifting her legs, particularly above the knees. The pain is constant, worsens with movement, and is accompanied by unilateral leg swelling, causing difficulty sleeping. Thrombosis has been ruled out. She has a history of intra-articular hyaluronic acid injections in her knees. A musculoskeletal issue with possible vascular involvement due to varicose veins is suspected. Magnesium  supplementation is suggested to address muscle tightness.  She will be referred to orthopedic specialist Dr. Burr Cary for further evaluation and to a vascular specialist for assessment of varicose veins. Recommend magnesium  supplementation, 200 mg daily. Prescribe tramadol  for nighttime pain management and administer a Toradol  injection in the office for immediate pain relief.  Shingles   Previously treated with Valtrex , which alleviated the burning sensation. Residual pain persists in the area of the rash. The condition was likely caught early, as the burning has subsided.  Follow-up   An appointment with Dr. Alray Askew is scheduled for next week, with attempts to expedite due to symptom severity. A referral to a vascular specialist is also in process. Contact Dr. Marny Sires office to attempt to schedule an earlier appointment. She should call the office if she has not heard back by the middle of the afternoon.    Return if symptoms worsen or fail to improve.    Lior Hoen R Lowne Chase, DO

## 2023-10-12 ENCOUNTER — Ambulatory Visit (INDEPENDENT_AMBULATORY_CARE_PROVIDER_SITE_OTHER): Admitting: Family Medicine

## 2023-10-12 VITALS — BP 140/86 | Wt 315.0 lb

## 2023-10-12 DIAGNOSIS — M79605 Pain in left leg: Secondary | ICD-10-CM | POA: Diagnosis not present

## 2023-10-12 DIAGNOSIS — M79604 Pain in right leg: Secondary | ICD-10-CM

## 2023-10-12 MED ORDER — PREDNISONE 10 MG PO TABS: ORAL_TABLET | ORAL | 0 refills | Status: DC

## 2023-10-12 MED ORDER — BACLOFEN 10 MG PO TABS: 10.0000 mg | ORAL_TABLET | Freq: Three times a day (TID) | ORAL | 0 refills | Status: DC | PRN

## 2023-10-12 NOTE — Patient Instructions (Signed)
 I'm concerned you have spinal stenosis (narrowing of the nerves in your spine that go into your legs). Take prednisone  dose pack as directed. Don't take aleve , ibuprofen, or meloxicam  with this. Stop the cyclobenzaprine  and take baclofen instead as needed for muscle spasms. Ok to take tylenol , tramadol  with this. Let me know how you're doing in 1 week. If improving would consider physical therapy.  If not improving consider MRI of your lumbar spine.

## 2023-10-13 ENCOUNTER — Encounter: Payer: Self-pay | Admitting: Family Medicine

## 2023-10-13 NOTE — Progress Notes (Signed)
 PCP: Estill Hemming, DO  Subjective:   HPI: Patient is a 63 y.o. female here for bilateral leg pain.  Patient reports bilateral leg discomfort and heaviness especially from hip to knee area. Initial in both legs but has primarily focused in left leg now. Has tried tramadol , cyclobenzaprine , IM toradol . Toradol  helped for about an hour, cyclobenzaprine  makes her very sleepy. Minimal low back discomfort. No numbness.  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   GERD (gastroesophageal reflux disease)    History of colon polyps    Hyperlipidemia    Hypertension    Joint pain    Lactose intolerance    Morbid obesity - BMI > 50 12/13/2012   Pneumonia    none recent   PONV (postoperative nausea and vomiting)    Poor venous access    "usually difficult stick"   Prediabetes    Sinusitis    SOB (shortness of breath)    Swelling    Vitamin D  deficiency     Current Outpatient Medications on File Prior to Visit  Medication Sig Dispense Refill   amLODipine  (NORVASC ) 2.5 MG tablet Take 1 tablet (2.5 mg total) by mouth 2 (two) times daily. 180 tablet 3   carvedilol  (COREG ) 3.125 MG tablet Take 1 tablet (3.125 mg total) by mouth 2 (two) times daily with a meal. 180 tablet 3   fluticasone  (FLONASE ) 50 MCG/ACT nasal spray Place 1 spray into both nostrils 2 (two) times daily as needed for allergies or rhinitis. 48 g 1   furosemide  (LASIX ) 40 MG tablet TAKE 1 TABLET BY MOUTH DAILY 90 tablet 1   glucose blood test strip Check blood sugar twice daily 100 each 11   meloxicam  (MOBIC ) 15 MG tablet TAKE 1/2 TO 1 TABLET BY MOUTH  EVERY DAY AS NEEDED 30 tablet 2   Multiple Vitamins-Minerals (MULTIVITAMIN ADULT PO) Take 1 tablet by mouth daily. flintstone vitamins     RABEprazole  (ACIPHEX ) 20 MG tablet Take 1 tablet (20 mg total) by mouth daily. 90 tablet 3   tirzepatide  (MOUNJARO ) 10 MG/0.5ML Pen Inject 10 mg into the skin once a week. 6 mL 2   valACYclovir  (VALTREX ) 1000 MG tablet Take 1 tablet (1,000 mg total) by mouth 3 (three) times daily. 21 tablet 0   Vitamin D , Ergocalciferol , (DRISDOL ) 1.25 MG (50000 UNIT) CAPS capsule Take 1 capsule (50,000 Units total) by mouth every 7 (seven) days. 12 capsule 1   No current facility-administered medications on file prior to visit.    Past Surgical History:  Procedure Laterality Date   ABDOMINAL HYSTERECTOMY     APPENDECTOMY     2'14   COLONOSCOPY  04/11/11   5 mm polyp removed but not recovered   COLONOSCOPY WITH PROPOFOL  N/A 05/11/2017   Procedure: COLONOSCOPY WITH PROPOFOL ;  Surgeon: Kenney Peacemaker, MD;  Location: [facility];  Service: Endoscopy;  Laterality: N/A;   GASTRIC ROUX-EN-Y N/A 02/27/2015   Procedure: LAPAROSCOPIC ROUX-EN-Y GASTRIC BYPASS WITH UPPER ENDOSCOPY;  Surgeon: Jacolyn Matar, MD;  Location: [facility];  Service: General;  Laterality: N/A;   KNEE SURGERY Right     KNEE SURGERY    LAPAROSCOPIC APPENDECTOMY  06/10/2012   Procedure: APPENDECTOMY LAPAROSCOPIC;  Surgeon: Eddye Goodie, MD;  Location: [facility];  Service: General;  Laterality: N/A;   UPPER GASTROINTESTINAL ENDOSCOPY  04/14/2006   Normal    Allergies  Allergen Reactions   Ace Inhibitors Anaphylaxis    Bowel angioedema   Cheese Nausea And Vomiting   Red Dye #40 (Allura Red) Nausea And Vomiting   Tuna [Fish Allergy ] Nausea And Vomiting    BP (!) 140/86 (BP Location: Left Arm, Patient Position: Sitting, Cuff Size: Large)   Wt (!) 315 lb (142.9 kg)   BMI 52.42 kg/m       No data to display              No data to display              Objective:  Physical Exam:  Gen:  NAD, comfortable in exam room  Back/Bilateral legs: No gross deformity, scoliosis. TTP left lumbar paraspinal region.  No midline or bony TTP. FROM low back, hips. Strength LEs 5/5 all muscle groups.   Negative SLRs.  Negative logroll bilateral hips. Sensation intact to light touch bilaterally.   Assessment & Plan:  1. Bilateral leg pain - left worse than right now.  Distribution, description of her heaviness and feeling of weakness concerning for spinal stenosis of lumbar spine.  Prednisone  dose pack, baclofen.  Ok to take tylenol  or tramadol  if needed also.  Let us  know how she's doing in 1 week.  Consider physical therapy if improving, MRI lumbar spine if not improving.

## 2023-10-14 ENCOUNTER — Other Ambulatory Visit: Payer: Self-pay | Admitting: Family Medicine

## 2023-10-14 ENCOUNTER — Ambulatory Visit: Admitting: Family Medicine

## 2023-10-14 DIAGNOSIS — Z1231 Encounter for screening mammogram for malignant neoplasm of breast: Secondary | ICD-10-CM

## 2023-10-28 ENCOUNTER — Ambulatory Visit
Admission: RE | Admit: 2023-10-28 | Discharge: 2023-10-28 | Disposition: A | Discharge status: Home / Self Care | Source: Ambulatory Visit | Attending: Family Medicine | Admitting: Family Medicine

## 2023-10-28 DIAGNOSIS — Z1231 Encounter for screening mammogram for malignant neoplasm of breast: Secondary | ICD-10-CM

## 2023-10-29 ENCOUNTER — Ambulatory Visit (INDEPENDENT_AMBULATORY_CARE_PROVIDER_SITE_OTHER): Admitting: Family Medicine

## 2023-10-29 ENCOUNTER — Encounter: Payer: Self-pay | Admitting: Family Medicine

## 2023-10-29 VITALS — BP 149/78 | Ht 66.0 in | Wt 310.0 lb

## 2023-10-29 VITALS — BP 132/65 | HR 80 | Ht 66.0 in | Wt 309.0 lb

## 2023-10-29 DIAGNOSIS — M79604 Pain in right leg: Secondary | ICD-10-CM | POA: Diagnosis not present

## 2023-10-29 DIAGNOSIS — I10 Essential (primary) hypertension: Secondary | ICD-10-CM | POA: Diagnosis not present

## 2023-10-29 DIAGNOSIS — M79605 Pain in left leg: Secondary | ICD-10-CM | POA: Diagnosis not present

## 2023-10-29 DIAGNOSIS — M5416 Radiculopathy, lumbar region: Secondary | ICD-10-CM

## 2023-10-29 MED ORDER — CARVEDILOL 3.125 MG PO TABS
3.1250 mg | ORAL_TABLET | Freq: Two times a day (BID) | ORAL | Status: DC
Start: 2023-10-29 — End: 2023-11-03

## 2023-10-29 MED ORDER — PREDNISONE 10 MG PO TABS: ORAL_TABLET | ORAL | 0 refills | Status: DC

## 2023-10-29 MED ORDER — CARVEDILOL 6.25 MG PO TABS
6.2500 mg | ORAL_TABLET | Freq: Two times a day (BID) | ORAL | Status: DC
Start: 2023-10-29 — End: 2023-10-29

## 2023-10-29 NOTE — Patient Instructions (Addendum)
 We will go ahead with an MRI of your lumbar spine. Take prednisone  12 day dose pack as directed - don't take aleve  or ibuprofen while on this. Ok to continue baclofen  as needed. Follow up (can be virtual) to go over MRI results when these come through the portal.  Located in: [facility] Address: [address], [place], [place] [address] Phone: [phone]

## 2023-10-29 NOTE — Progress Notes (Signed)
 PCP: [name], DO  Subjective:   HPI: Patient is a 63 y.o. female here for follow-up on bilateral leg pain.  Tammy [name] was last seen on 10/12/2023 where she was started on a prednisone  taper and baclofen  for suspected spinal stenosis. Since then she has had improvement in her right leg symptoms but persistence of her left leg pain. She states that the prednisone  provided her with decent pain relief when she was on the taper, but after discontinuing her pain returned. The baclofen  has not led to a significant benefit and caused her to be fatigued. She describes her pain as being both anterior and lateral thigh, worsened with movement from sit to stand and laying in bed. Her pain is also bad when she sits for a prolonged period of time. She is not having any radiation of pain past the knee nor up into the spine. She is not having any numbness, tingling, burning.  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   GERD (gastroesophageal reflux disease)    History of colon polyps    Hyperlipidemia    Hypertension    Joint pain    Lactose intolerance    Morbid obesity - BMI > 50 12/13/2012   Pneumonia    none recent   PONV (postoperative nausea and vomiting)    Poor venous access    usually difficult stick   Prediabetes    Sinusitis    SOB (shortness of breath)    Swelling    Vitamin D  deficiency     Current Outpatient Medications on File Prior to Visit  Medication Sig Dispense Refill   amLODipine  (NORVASC ) 2.5 MG tablet Take 1 tablet (2.5 mg total) by mouth 2 (two) times daily. 180 tablet 3   baclofen  (LIORESAL ) 10 MG  tablet Take 1 tablet (10 mg total) by mouth 3 (three) times daily as needed for muscle spasms. 60 tablet 0   carvedilol  (COREG ) 3.125 MG tablet Take 1 tablet (3.125 mg total) by mouth 2 (two) times daily with a meal. 180 tablet 3   fluticasone  (FLONASE ) 50 MCG/ACT nasal spray Place 1 spray into both nostrils 2 (two) times daily as needed for allergies or rhinitis. 48 g 1   furosemide  (LASIX ) 40 MG tablet TAKE 1 TABLET BY MOUTH DAILY 90 tablet 1   glucose blood test strip Check blood sugar twice daily 100 each 11   meloxicam  (MOBIC ) 15 MG tablet TAKE 1/2 TO 1 TABLET BY MOUTH EVERY DAY AS NEEDED 30 tablet 2   Multiple Vitamins-Minerals (MULTIVITAMIN ADULT PO) Take 1 tablet by mouth daily. flintstone vitamins     RABEprazole  (ACIPHEX ) 20 MG tablet Take 1 tablet (20 mg total) by mouth daily. 90 tablet 3   tirzepatide  (MOUNJARO ) 10 MG/0.5ML Pen Inject 10 mg into the skin once a week. 6 mL 2   valACYclovir  (VALTREX ) 1000 MG tablet Take 1 tablet (1,000 mg total) by mouth 3 (three) times daily. 21 tablet 0   Vitamin D , Ergocalciferol , (DRISDOL ) 1.25 MG (50000 UNIT) CAPS capsule Take 1 capsule (50,000 Units  total) by mouth every 7 (seven) days. 12 capsule 1   No current facility-administered medications on file prior to visit.    Past Surgical History:  Procedure Laterality Date   ABDOMINAL HYSTERECTOMY     APPENDECTOMY     2'14   COLONOSCOPY  04/11/11   5 mm polyp removed but not recovered   COLONOSCOPY WITH PROPOFOL  N/A 05/11/2017   Procedure: COLONOSCOPY WITH PROPOFOL ;  Surgeon: Avram Lupita BRAVO, MD;  Location: [facility];  Service: Endoscopy;  Laterality: N/A;   GASTRIC ROUX-EN-Y N/A 02/27/2015   Procedure: LAPAROSCOPIC ROUX-EN-Y GASTRIC BYPASS WITH UPPER ENDOSCOPY;  Surgeon: Donnice Lunger, MD;  Location: [facility];  Service: General;  Laterality: N/A;   KNEE SURGERY Right     KNEE SURGERY    LAPAROSCOPIC APPENDECTOMY  06/10/2012   Procedure: APPENDECTOMY LAPAROSCOPIC;  Surgeon: Elspeth KYM Schultze,  MD;  Location: [facility];  Service: General;  Laterality: N/A;   UPPER GASTROINTESTINAL ENDOSCOPY  04/14/2006   Normal    Allergies  Allergen Reactions   Ace Inhibitors Anaphylaxis    Bowel angioedema   Cheese Nausea And Vomiting   Red Dye #40 (Allura Red) Nausea And Vomiting   Tuna [Fish Allergy ] Nausea And Vomiting    BP (!) 149/78   Ht 5' 6 (1.676 m)   Wt (!) 310 lb (140.6 kg)   BMI 50.04 kg/m       No data to display              No data to display              Objective:  Physical Exam:  Gen: NAD, comfortable in exam room  Back/left leg: Some excessive lordosis.  No other gross deformity, scoliosis.  Circumducts with left leg on gait. No tenderness of low back or left leg.  No midline or bony TTP. FROM with some pain on extension of back.   Left hip flexion, knee extension 4/5, but 5/5 other muscle groups Negative SLRs, logroll hips Sensation intact to light touch bilaterally.  Assessment & Plan:  Patient is a 63 y.o. female here for left leg pain.  Still most consistent with pain from lumbar spinal stenosis - right leg pain resolved from this though left leg pain still bothering quite a bit.    1. Left leg pain - will prescribe 12 day course of prednisone  - MRI of lumbar spine to assess, consider epidural steroid injections, neurosurgery referral based on results. - baclofen  as needed

## 2023-10-29 NOTE — Progress Notes (Signed)
 Acute Office Visit  Subjective:     Patient ID: Tammy [name], female    [name]: 07/07/1960, 63 y.o.   MRN: [id number]  Chief Complaint  Patient presents with   Hypertension    HPI Patient is in today for elevated BP readings.   Discussed the use of AI scribe software for clinical note transcription with the patient, who gave verbal consent to proceed.  History of Present Illness Tammy [name] is a 63 year old female with hypertension who presents with elevated blood pressure and headaches.  She has experienced elevated blood pressure since Tuesday of last week, with the highest home reading being 193/85. Average blood pressure readings over the past week have been in the 150s/70s range. Blood pressure has been high during several recent medical visits, including those in June, May, and April, with a notable increase since last November. Current medications include furosemide  40 mg daily, amlodipine  2.5 mg twice daily and carvedilol  3.125 mg twice daily for blood pressure management. She has a severe allergy  to ACE inhibitors. She has not yet started a prescribed course of prednisone  from sports medicine for knee pain.  Headaches have been present for the past two days, described as a new symptom. Dizziness and nausea occurred on the day of the highest blood pressure reading. No sinus issues contributing to her headaches, although sinus problems have caused headaches in the past. The recent headache is described as a heavy pressure in the back of her head, without nasal symptoms.  She reports knee and leg pain, initially experiencing pain in both knees, feeling as if weights were on them, and was unable to walk, describing her legs as feeling like 'cement.' The pain has improved, particularly in the right leg, but she continues to have issues with the left leg, which is causing irritation due to compensatory weight bearing.  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 Pain and stress may be contributing to her elevated blood pressure, noting that she is not sleeping well due to pain and is unsure if she is stressed. She has noticed that her blood pressure tends to decrease after talking and 'getting stuff off her chest,' suggesting a stress-related component. She does not currently take any medication for stress or anxiety.         ROS All review of systems negative except what is listed in the HPI      Objective:    BP 132/65   Pulse 80   Ht 5' 6 (1.676 m)   Wt (!) 309 lb (140.2 kg)   SpO2 100%   BMI 49.87 kg/m    Physical Exam Vitals reviewed.  Constitutional:      General: She is not in acute distress.    Appearance: Normal appearance. She is obese. She is not ill-appearing.   Cardiovascular:     Rate and Rhythm: Normal rate and regular rhythm.  Pulmonary:     Effort: Pulmonary effort is normal.     Breath sounds: Normal breath sounds.   Skin:    General: Skin is warm and dry.   Neurological:     Mental Status: She is alert and oriented to person, place, and time.   Psychiatric:        Mood and Affect: Mood normal.        Behavior: Behavior normal.        Thought Content: Thought content normal.  Judgment: Judgment normal.        No results found for any visits on 10/29/23.      Assessment & Plan:   Problem List Items Addressed This Visit       Active Problems   Essential hypertension - Primary   Relevant Medications   carvedilol  (COREG ) 3.125 MG tablet   Assessment and Plan Assessment & Plan Hypertension Blood pressure remains elevated with recent spikes at home. Stress, pain, and prednisone  use contribute to hypertension. Stable BP on recheck today after resting during appointment.  - Increase carvedilol  to 6.25 mg twice daily if blood pressure remains high (>140/90) after resting for 5-10 minutes. Otherwise, continue current regimen -  keep in mind starting prednisone  for ortho issues may increase pressures.  - Monitor blood pressure at home - Avoid high salt intake and stay hydrated. - Manage stress through lifestyle modifications, including journaling and communication with her husband- not interested in additional measures at this time.  Patient aware of signs/symptoms requiring further/urgent evaluation.  - Keep routine PCP follow-up. Return sooner if BP consistently >140/90    Meds ordered this encounter  Medications   DISCONTD: carvedilol  (COREG ) 6.25 MG tablet    Sig: Take 1 tablet (6.25 mg total) by mouth 2 (two) times daily with a meal.    Supervising Provider:   DOMENICA BLACKBIRD A [[id number]]   carvedilol  (COREG ) 3.125 MG tablet    Sig: Take 1 tablet (3.125 mg total) by mouth 2 (two) times daily with a meal.    Supervising Provider:   DOMENICA BLACKBIRD A [[id number]]    Return in about 3 months (around 01/29/2024), or if symptoms worsen or fail to improve, for chronic disease management PCP.  [name] Tammy Mon, NP

## 2023-10-30 ENCOUNTER — Telehealth: Payer: Self-pay

## 2023-10-30 NOTE — Telephone Encounter (Signed)
 Patient was scheduled for a screening colonoscopy on 11/26/23 with Dr. Avram per ambulatory referral. RN noted during chart prep that patient's last colonoscopy with Dr. Avram was in January 2019, no polyps found and Dr. Avram recommended 10 year recall.  Patient was seen again in office with Dr. Avram in 2023 where it was again noted that patient did not need another colonoscopy until 2029.  Pt called and informed of above.  She was asked if she was having any abdominal issues, changes in bowel habits, rectal bleeding, etc.  She denies any GI changes and states she just thought my pcp said it was time to have another colonoscopy.  She was informed she does not need a colonoscopy until 2029 unless she has any GI changes.  She was informed to call the office if she does have GI issues for an appointment, she verbalized understanding.  Colonoscopy recall for 2029 entered. SChaplin, RN,BSN

## 2023-11-02 ENCOUNTER — Ambulatory Visit: Admitting: Family Medicine

## 2023-11-03 ENCOUNTER — Encounter: Payer: Self-pay | Admitting: Family Medicine

## 2023-11-03 ENCOUNTER — Ambulatory Visit (INDEPENDENT_AMBULATORY_CARE_PROVIDER_SITE_OTHER): Admitting: Family Medicine

## 2023-11-03 DIAGNOSIS — I10 Essential (primary) hypertension: Secondary | ICD-10-CM

## 2023-11-03 MED ORDER — CARVEDILOL 12.5 MG PO TABS: 12.5000 mg | ORAL_TABLET | Freq: Two times a day (BID) | ORAL | 3 refills | Status: AC

## 2023-11-03 NOTE — Progress Notes (Signed)
 Established Patient Office Visit  Subjective   Patient ID: Tammy [name], female    [name]: 11-Mar-1961  Age: 63 y.o. MRN: [id number]  Chief Complaint  Patient presents with   Hypertension   Follow-up    HPI Discussed the use of AI scribe software for clinical note transcription with the patient, who gave verbal consent to proceed.  History of Present Illness [name] is a 63 year old female with hypertension who presents with uncontrolled blood pressure and headaches.  She has been experiencing ongoing issues with blood pressure management, with recent readings fluctuating, including 176/89 mmHg this morning and 160/88 mmHg before the visit. Her blood pressure medication, Coreg , was previously adjusted by splitting the dose, but she has returned to taking 6.25 mg twice a day. Despite this, there has been no significant improvement in blood pressure control.  She describes experiencing headaches, initially attributed to sinus issues, and used nasal spray without relief. The headache persists, moving from one area to another. She notes that when her blood pressure is normal, she does not experience headaches, but the presence of headaches is concerning.  Regarding her medication, she confirms taking Coreg  6.25 mg twice daily and mentions previous adjustments due to low oxygen levels when first starting the medication. She also takes amlodipine , but no changes have been made to this medication recently. She has both Aciphex  and pantoprazole  at home for gastrointestinal relief and is unsure which one she should be taking, noting that insurance coverage has affected which medication is filled.  In terms of lifestyle factors, she has gained approximately four pounds since her last visit, bringing her weight to 307 pounds. She has recently started drinking coffee, which she suspects may be contributing to her elevated blood pressure.  She has been mindful of her salt intake, avoiding adding salt to her cooking, but acknowledges consuming spicy foods that may contain hidden salt. No recent changes in her diet aside from increased fruit consumption. She is taking vitamin D  and cod liver oil supplements. No use of medications with decongestants.   Patient Active Problem List   Diagnosis Date Noted   Symptomatic varicose veins, right 10/06/2023   Pain of right thigh 09/25/2023   Cervical radiculopathy 02/09/2023   Gastroesophageal reflux disease 08/14/2022   Claustrophobia 08/14/2022   Myalgia 08/14/2022   Bronchitis due to COVID-19 virus 01/14/2022   Acute left-sided low back pain without sciatica 11/28/2021   Bronchitis 09/26/2021   Multiple atypical skin moles 06/21/2021   Vertigo 06/21/2021   Acute cough 04/09/2021   History of pneumonia 04/09/2021   Pneumonia due to infectious organism 03/12/2021   Preventative health care 02/15/2021   Osteoarthritis of left knee 12/12/2020   Primary osteoarthritis of right knee 12/12/2020   Low back pain with radiation 03/14/2020   Hyperlipidemia 04/07/2019   Type 2 diabetes mellitus with hyperglycemia, without long-term current use of insulin  (HCC) 04/07/2019   Neck pain 04/07/2019   Chronic seasonal allergic rhinitis due to pollen 04/07/2019   Lymphedema 03/04/2019   Edema due to malnutrition (HCC) 10/10/2018   Class 3 severe obesity with serious comorbidity and body mass index (BMI) of 50.0 to 59.9 in adult 08/30/2018   Lower extremity edema 07/04/2015   Hoarseness 06/15/2015   Bronchospasm with bronchitis, acute 05/24/2015   Elevated CA-125 03/16/2015   Roux  en Y gastric bypass Oct 2016 02/27/2015   Enteritis 01/25/2015   Ascites 01/25/2015   Abdominal pain 01/22/2015   Ovarian cyst, left 01/03/2015   Allergic rhinitis 12/21/2013   Right ankle pain 11/10/2013   Morbid obesity - BMI > 50 12/13/2012   Chronic vomiting 12/13/2012   Lactose intolerance 10/22/2012    Chronic pain of both knees 07/26/2012   Bacterial vaginosis 06/10/2012   Bradycardia, sinus 03/28/2012   Central positional vertigo 03/27/2012   Tongue swelling 08/21/2011   Urgency of urination 05/14/2011   History of colonic polyps 04/11/2011   Otalgia 11/14/2010   OTHER TENOSYNOVITIS OF HAND AND WRIST 07/19/2010   DYSPEPSIA 10/17/2009   BACK PAIN, LUMBAR, WITH RADICULOPATHY 12/27/2008   VITAMIN B12 DEFICIENCY 09/29/2008   SYNCOPE 08/21/2008   ANKLE EDEMA, CHRONIC 12/16/2006   DM (diabetes mellitus) type II, controlled, with peripheral vascular disorder (HCC) 11/09/2006   Hyperlipidemia LDL goal <70 11/09/2006   Essential hypertension 05/26/2006   GERD 05/26/2006   Past Medical History:  Diagnosis Date   Acute on chronic appendicitis s/p lap appy 06/10/2012 06/10/2012   surgery done 06-10-12   Angioedema of intestine due to angiotensin converting enzyme inhibitor (ACE-I)    Arthritis    ankle   Back pain    Chest pain    Complication of anesthesia    Constipation    Diabetes mellitus    TYPE 2- no meds in several months-was taken off meds -monitoring blood sugar.   GERD (gastroesophageal reflux disease)    History of colon polyps    Hyperlipidemia    Hypertension    Joint pain    Lactose intolerance    Morbid obesity - BMI > 50 12/13/2012   Pneumonia    none recent   PONV (postoperative nausea and vomiting)    Poor venous access    usually difficult stick   Prediabetes    Sinusitis    SOB (shortness of breath)    Swelling    Vitamin D  deficiency    Past Surgical History:  Procedure Laterality Date   ABDOMINAL HYSTERECTOMY     APPENDECTOMY     2'14   COLONOSCOPY  04/11/11   5 mm polyp removed but not recovered   COLONOSCOPY WITH PROPOFOL  N/A 05/11/2017   Procedure: COLONOSCOPY WITH PROPOFOL ;  Surgeon: Avram Lupita BRAVO, MD;  Location: [facility];  Service: Endoscopy;  Laterality: N/A;   GASTRIC ROUX-EN-Y N/A 02/27/2015   Procedure: LAPAROSCOPIC ROUX-EN-Y  GASTRIC BYPASS WITH UPPER ENDOSCOPY;  Surgeon: Donnice Lunger, MD;  Location: [facility];  Service: General;  Laterality: N/A;   KNEE SURGERY Right     KNEE SURGERY    LAPAROSCOPIC APPENDECTOMY  06/10/2012   Procedure: APPENDECTOMY LAPAROSCOPIC;  Surgeon: Elspeth KYM Schultze, MD;  Location: [facility];  Service: General;  Laterality: N/A;   UPPER GASTROINTESTINAL ENDOSCOPY  04/14/2006   Normal   Social History   Tobacco Use   Smoking status: Never   Smokeless tobacco: Never  Vaping Use   Vaping status: Never Used  Substance Use Topics   Alcohol use: No    Alcohol/week: 0.0 standard drinks of alcohol   Drug use: No   Social History   Socioeconomic History   Marital status: Married    Spouse name: Taft   Number of children: 0   Years of education: Not on file   Highest education level: Some college, no degree  Occupational History   Occupation: FAB Set designer: [employer]  Tobacco Use   Smoking status: Never   Smokeless tobacco: Never  Vaping Use   Vaping status: Never Used  Substance and Sexual Activity   Alcohol use: No    Alcohol/week: 0.0 standard drinks of alcohol   Drug use: No   Sexual activity: Not on file  Other Topics Concern   Not on file  Social History Narrative   Married, works at [employer]   No children   No alcohol tobacco or drug use   Social Drivers of Corporate investment banker Strain: Low Risk  (11/03/2023)   Overall Financial Resource Strain (CARDIA)    Difficulty of Paying Living Expenses: Not hard at all  Food Insecurity: No Food Insecurity (11/03/2023)   Hunger Vital Sign    Worried About Running Out of Food in the Last Year: Never true    Ran Out of Food in the Last Year: Never true  Transportation Needs: No Transportation Needs (11/03/2023)   PRAPARE - Administrator, Civil Service (Medical): No    Lack of Transportation (Non-Medical): No  Physical Activity: Insufficiently Active (11/03/2023)   Exercise Vital Sign     Days of Exercise per Week: 2 days    Minutes of Exercise per Session: 30 min  Stress: No Stress Concern Present (11/03/2023)   [employer] of Occupational Health - Occupational Stress Questionnaire    Feeling of Stress: Only a little  Social Connections: Socially Integrated (11/03/2023)   Social Connection and Isolation Panel    Frequency of Communication with Friends and Family: Three times a week    Frequency of Social Gatherings with Friends and Family: Once a week    Attends Religious Services: More than 4 times per year    Active Member of [employer] or Organizations: Yes    Attends Engineer, structural: More than 4 times per year    Marital Status: Married  Catering manager Violence: Unknown (12/10/2021)   Received from [facility]   HITS    Physically Hurt: Not on file    Insult or Talk Down To: Not on file    Threaten Physical Harm: Not on file    Scream or Curse: Not on file   Family Status  Relation Name Status   Mother  Deceased   Father  Deceased at age 78   Mat Aunt  (Not Specified)   Mat Aunt  (Not Specified)   Oceanographer  (Not Specified)   PGM  (Not Specified)   Cousin  Deceased   Brother  Alive   Brother  Alive   Brother  Alive   Brother  Alive   Neg Hx  (Not Specified)  No partnership data on file   Family History  Problem Relation Age of Onset   Hyperlipidemia Mother    Hypertension Mother    Hodgkin's lymphoma Mother 70   Diabetes Father    Heart failure Father    Heart disease Father    Breast cancer Maternal Aunt    Breast cancer Maternal Aunt    Diabetes Paternal Grandmother    Heart disease Cousin    Hyperlipidemia Brother    Colon cancer Neg Hx    Heart attack Neg Hx    Sudden death Neg Hx    Colon polyps Neg Hx    Esophageal cancer Neg Hx    Stomach cancer Neg Hx    Rectal cancer Neg Hx    Allergies  Allergen Reactions   Ace  Inhibitors Anaphylaxis    Bowel angioedema   Cheese Nausea And Vomiting   Red Dye #40 (Allura Red)  Nausea And Vomiting   Tuna [Fish Allergy ] Nausea And Vomiting      Review of Systems  Constitutional:  Negative for fever and malaise/fatigue.  HENT:  Negative for congestion.   Eyes:  Negative for blurred vision.  Respiratory:  Negative for cough and shortness of breath.   Cardiovascular:  Negative for chest pain, palpitations and leg swelling.  Gastrointestinal:  Negative for vomiting.  Musculoskeletal:  Negative for back pain.  Skin:  Negative for rash.  Neurological:  Positive for headaches. Negative for loss of consciousness.      Objective:     BP (!) 148/90 (BP Location: Left Arm, Patient Position: Sitting)   Pulse 83   Temp 97.9 F (36.6 C) (Oral)   Resp 18   Ht 5' 6 (1.676 m)   Wt (!) 307 lb 9.6 oz (139.5 kg)   SpO2 98%   BMI 49.65 kg/m  BP Readings from Last 3 Encounters:  11/03/23 (!) 148/90  10/29/23 132/65  10/29/23 (!) 149/78   Wt Readings from Last 3 Encounters:  11/03/23 (!) 307 lb 9.6 oz (139.5 kg)  10/29/23 (!) 309 lb (140.2 kg)  10/29/23 (!) 310 lb (140.6 kg)   SpO2 Readings from Last 3 Encounters:  11/03/23 98%  10/29/23 100%  10/06/23 96%      Physical Exam Vitals and nursing note reviewed.  Constitutional:      General: She is not in acute distress.    Appearance: Normal appearance. She is well-developed.  HENT:     Head: Normocephalic and atraumatic.   Eyes:     General: No scleral icterus.       Right eye: No discharge.        Left eye: No discharge.    Cardiovascular:     Rate and Rhythm: Normal rate and regular rhythm.     Heart sounds: No murmur heard. Pulmonary:     Effort: Pulmonary effort is normal. No respiratory distress.     Breath sounds: Normal breath sounds.   Musculoskeletal:        General: Normal range of motion.     Cervical back: Normal range of motion and neck supple.     Right lower leg: Edema present.     Left lower leg: Edema present.   Skin:    General: Skin is warm and dry.   Neurological:      Mental Status: She is alert and oriented to person, place, and time.   Psychiatric:        Mood and Affect: Mood normal.        Behavior: Behavior normal.        Thought Content: Thought content normal.        Judgment: Judgment normal.      No results found for any visits on 11/03/23.  Last CBC Lab Results  Component Value Date   WBC 2.5 (L) 08/06/2023   HGB 12.3 08/06/2023   HCT 37.2 08/06/2023   MCV 87.4 08/06/2023   MCH 28.4 10/08/2018   RDW 14.7 08/06/2023   PLT 257.0 08/06/2023   Last metabolic panel Lab Results  Component Value Date   GLUCOSE 82 08/06/2023   NA 143 08/06/2023   K 4.0 08/06/2023   CL 103 08/06/2023   CO2 29 08/06/2023   BUN 13 08/06/2023   CREATININE 0.95 08/06/2023   GFR 64.21 08/06/2023  CALCIUM  8.8 08/06/2023   PROT 6.6 08/06/2023   ALBUMIN 4.0 08/06/2023   LABGLOB 2.7 06/03/2018   AGRATIO 1.6 06/03/2018   BILITOT 0.8 08/06/2023   ALKPHOS 73 08/06/2023   AST 18 08/06/2023   ALT 17 08/06/2023   ANIONGAP 5 04/22/2018   Last lipids Lab Results  Component Value Date   CHOL 185 08/06/2023   HDL 70.60 08/06/2023   LDLCALC 103 (H) 08/06/2023   LDLDIRECT 129.1 10/17/2009   TRIG 57.0 08/06/2023   CHOLHDL 3 08/06/2023   Last hemoglobin A1c Lab Results  Component Value Date   HGBA1C 5.7 08/06/2023   Last thyroid  functions Lab Results  Component Value Date   TSH 2.13 02/09/2023   T3TOTAL 86 06/03/2018   Last vitamin D  Lab Results  Component Value Date   VD25OH 10.21 (L) 07/10/2022   Last vitamin B12 and Folate Lab Results  Component Value Date   VITAMINB12 547 06/03/2018   FOLATE 12.3 06/03/2018      The 10-year ASCVD risk score (Arnett DK, et al., 2019) is: 21.1%    Assessment & Plan:   Problem List Items Addressed This Visit       Unprioritized   Essential hypertension   Relevant Medications   carvedilol  (COREG ) 12.5 MG tablet  Assessment and Plan Assessment & Plan Hypertension   Blood pressure  fluctuates with recent readings up to 176/89 mmHg. She is on carvedilol  6.25 mg twice daily, previously adjusted due to hypoxemia. Increased caffeine intake from a new coffee machine at work and consumption of spicy foods with potential high sodium content may contribute to elevated blood pressure. Amlodipine  remains unchanged due to risk of peripheral edema. Increase carvedilol  to 12.5 mg twice daily by taking two 6.25 mg tablets in the morning and two at night. Advise cessation of caffeine intake and monitor blood pressure for changes. Review dietary intake for hidden sodium sources, particularly in spicy foods and sauces.  Gastroesophageal Reflux Disease (GERD)   She is currently on Aciphex  (rabeprazole ) for GERD. Clarified confusion regarding concurrent use of Aciphex  and pantoprazole ; only one is necessary. Aciphex  is covered by insurance, pantoprazole  is not. Continue Aciphex  (rabeprazole ) as it is covered by insurance and discontinue pantoprazole .    No follow-ups on file.    Michaeljohn Biss R Lowne Chase, DO

## 2023-11-11 ENCOUNTER — Encounter

## 2023-11-13 ENCOUNTER — Telehealth (HOSPITAL_BASED_OUTPATIENT_CLINIC_OR_DEPARTMENT_OTHER): Payer: Self-pay

## 2023-11-16 ENCOUNTER — Other Ambulatory Visit: Payer: Self-pay | Admitting: Family Medicine

## 2023-11-18 ENCOUNTER — Telehealth (HOSPITAL_BASED_OUTPATIENT_CLINIC_OR_DEPARTMENT_OTHER): Payer: Self-pay

## 2023-11-20 ENCOUNTER — Other Ambulatory Visit: Payer: Self-pay | Admitting: Family Medicine

## 2023-11-20 MED ORDER — BACLOFEN 10 MG PO TABS: 10.0000 mg | ORAL_TABLET | Freq: Three times a day (TID) | ORAL | 1 refills | Status: DC | PRN

## 2023-11-20 NOTE — Progress Notes (Signed)
Baclofen script refilled

## 2023-11-26 ENCOUNTER — Encounter: Admitting: Internal Medicine

## 2023-12-07 ENCOUNTER — Other Ambulatory Visit: Payer: Self-pay

## 2023-12-07 DIAGNOSIS — I872 Venous insufficiency (chronic) (peripheral): Secondary | ICD-10-CM

## 2023-12-15 ENCOUNTER — Other Ambulatory Visit: Payer: Self-pay | Admitting: Family Medicine

## 2023-12-15 MED ORDER — DIAZEPAM 5 MG PO TABS: ORAL_TABLET | ORAL | 0 refills | Status: DC

## 2023-12-15 NOTE — Progress Notes (Signed)
 Valium  sent in for claustrophobia for procedure.

## 2023-12-20 ENCOUNTER — Encounter (HOSPITAL_BASED_OUTPATIENT_CLINIC_OR_DEPARTMENT_OTHER): Payer: Self-pay

## 2023-12-20 ENCOUNTER — Ambulatory Visit (HOSPITAL_BASED_OUTPATIENT_CLINIC_OR_DEPARTMENT_OTHER)
Admission: RE | Admit: 2023-12-20 | Discharge: 2023-12-20 | Disposition: A | Discharge status: Home / Self Care | Source: Ambulatory Visit | Attending: Family Medicine | Admitting: Family Medicine

## 2023-12-20 DIAGNOSIS — M5416 Radiculopathy, lumbar region: Secondary | ICD-10-CM

## 2023-12-20 DIAGNOSIS — M79604 Pain in right leg: Secondary | ICD-10-CM

## 2023-12-21 ENCOUNTER — Other Ambulatory Visit: Payer: Self-pay

## 2023-12-21 DIAGNOSIS — M5416 Radiculopathy, lumbar region: Secondary | ICD-10-CM

## 2023-12-21 DIAGNOSIS — M79604 Pain in right leg: Secondary | ICD-10-CM

## 2023-12-28 ENCOUNTER — Telehealth: Payer: Self-pay

## 2023-12-28 NOTE — Telephone Encounter (Signed)
 Patient called office to inform that she was unable to do MRI due to her needing something to help her relax, patient would like a call back, thanks.

## 2023-12-29 ENCOUNTER — Encounter: Payer: Self-pay | Admitting: Family Medicine

## 2024-01-06 ENCOUNTER — Ambulatory Visit (HOSPITAL_COMMUNITY)
Admission: RE | Admit: 2024-01-06 | Discharge: 2024-01-06 | Disposition: A | Discharge status: Home / Self Care | Source: Ambulatory Visit | Attending: Vascular Surgery | Admitting: Vascular Surgery

## 2024-01-06 ENCOUNTER — Ambulatory Visit: Attending: Vascular Surgery | Admitting: Physician Assistant

## 2024-01-06 VITALS — BP 131/85 | HR 61 | Temp 97.7°F | Wt 315.7 lb

## 2024-01-06 DIAGNOSIS — I872 Venous insufficiency (chronic) (peripheral): Secondary | ICD-10-CM

## 2024-01-06 DIAGNOSIS — I890 Lymphedema, not elsewhere classified: Secondary | ICD-10-CM | POA: Diagnosis not present

## 2024-01-06 NOTE — Progress Notes (Unsigned)
 Requested by:  [address] Yvonne R, DO [address] [place],  [place] [address]  Reason for consultation: Edema   History of Present Illness   Tammy [name] is a 63 y.o. ([birth year] female who presents for evaluation of lower extremity edema.  She says that she has dealt with bilateral lower leg and foot swelling for several years now.  Her swelling has gotten worse over time.  Her leg and foot swelling gets worse later on the day after spending several hours on her feet.  This causes her legs and feet feel very heavy and achy.  She has also been dealing with weakness and burning pain in her left thigh for several months now.  This pain is worsened with movement from sit to stand and after sitting for prolonged periods of time.  She is currently seeing an orthopedic doctor for this issue with concern for spinal stenosis.  She does elevate her legs in the evenings after work, which helps with her leg heaviness.  She has previously tried knee-high compression stockings, however these are very uncomfortable and cut off her circulation around the bend of the knee.  She currently uses adjustable Velcro calf sleeves to help with her lower leg edema.  She is working on losing weight.  She says that she has lost around 30 pounds, which has helped with her leg heaviness.  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   GERD (gastroesophageal reflux disease)    History of colon polyps    Hyperlipidemia    Hypertension    Joint pain    Lactose intolerance    Morbid obesity - BMI > 50 12/13/2012   Pneumonia    none  recent   PONV (postoperative nausea and vomiting)    Poor venous access    usually difficult stick   Prediabetes    Sinusitis    SOB (shortness of breath)    Swelling    Vitamin D  deficiency     Past Surgical History:  Procedure Laterality Date   ABDOMINAL HYSTERECTOMY     APPENDECTOMY     2'14   COLONOSCOPY  04/11/11   5 mm polyp removed but not recovered   COLONOSCOPY WITH PROPOFOL  N/A 05/11/2017   Procedure: COLONOSCOPY WITH PROPOFOL ;  Surgeon: Avram Lupita BRAVO, MD;  Location: [facility];  Service: Endoscopy;  Laterality: N/A;   GASTRIC ROUX-EN-Y N/A 02/27/2015   Procedure: LAPAROSCOPIC ROUX-EN-Y GASTRIC BYPASS WITH UPPER ENDOSCOPY;  Surgeon: Donnice Lunger, MD;  Location: [facility];  Service: General;  Laterality: N/A;   KNEE SURGERY Right     KNEE SURGERY    LAPAROSCOPIC APPENDECTOMY  06/10/2012   Procedure: APPENDECTOMY LAPAROSCOPIC;  Surgeon: Elspeth KYM Schultze, MD;  Location: [facility];  Service: General;  Laterality: N/A;   UPPER GASTROINTESTINAL ENDOSCOPY  04/14/2006   Normal    Social History   Socioeconomic History   Marital status: Married    Spouse name: Taft   Number of children: 0   Years of education: Not on file  Highest education level: Some college, no degree  Occupational History   Occupation: Equities trader: [employer]  Tobacco Use   Smoking status: Never   Smokeless tobacco: Never  Vaping Use   Vaping status: Never Used  Substance and Sexual Activity   Alcohol use: No    Alcohol/week: 0.0 standard drinks of alcohol   Drug use: No   Sexual activity: Not on file  Other Topics Concern   Not on file  Social History Narrative   Married, works at [employer]   No children   No alcohol tobacco or drug use   Social Drivers of Corporate investment banker Strain: Low Risk  (11/03/2023)   Overall Financial Resource Strain (CARDIA)    Difficulty of Paying Living Expenses: Not hard at all  Food Insecurity: No Food Insecurity (11/03/2023)    Hunger Vital Sign    Worried About Running Out of Food in the Last Year: Never true    Ran Out of Food in the Last Year: Never true  Transportation Needs: No Transportation Needs (11/03/2023)   PRAPARE - Administrator, Civil Service (Medical): No    Lack of Transportation (Non-Medical): No  Physical Activity: Insufficiently Active (11/03/2023)   Exercise Vital Sign    Days of Exercise per Week: 2 days    Minutes of Exercise per Session: 30 min  Stress: No Stress Concern Present (11/03/2023)   [employer] of Occupational Health - Occupational Stress Questionnaire    Feeling of Stress: Only a little  Social Connections: Socially Integrated (11/03/2023)   Social Connection and Isolation Panel    Frequency of Communication with Friends and Family: Three times a week    Frequency of Social Gatherings with Friends and Family: Once a week    Attends Religious Services: More than 4 times per year    Active Member of [employer] or Organizations: Yes    Attends Engineer, structural: More than 4 times per year    Marital Status: Married  Catering manager Violence: Unknown (12/10/2021)   Received from [facility]   HITS    Physically Hurt: Not on file    Insult or Talk Down To: Not on file    Threaten Physical Harm: Not on file    Scream or Curse: Not on file    Family History  Problem Relation Age of Onset   Hyperlipidemia Mother    Hypertension Mother    Hodgkin's lymphoma Mother 29   Diabetes Father    Heart failure Father    Heart disease Father    Breast cancer Maternal Aunt    Breast cancer Maternal Aunt    Diabetes Paternal Grandmother    Heart disease Cousin    Hyperlipidemia Brother    Colon cancer Neg Hx    Heart attack Neg Hx    Sudden death Neg Hx    Colon polyps Neg Hx    Esophageal cancer Neg Hx    Stomach cancer Neg Hx    Rectal cancer Neg Hx     Current Outpatient Medications  Medication Sig Dispense Refill   amLODipine  (NORVASC ) 2.5 MG  tablet Take 1 tablet (2.5 mg total) by mouth 2 (two) times daily. 180 tablet 3   baclofen  (LIORESAL ) 10 MG tablet Take 1 tablet (10 mg total) by mouth 3 (three) times daily as needed for muscle spasms. 60 tablet 1   carvedilol  (COREG ) 12.5 MG tablet Take 1 tablet (12.5 mg  total) by mouth 2 (two) times daily with a meal. 180 tablet 3   diazepam  (VALIUM ) 5 MG tablet Take 1 tablet 30 minutes prior to procedure.  May repeat x1. 2 tablet 0   fluticasone  (FLONASE ) 50 MCG/ACT nasal spray Place 1 spray into both nostrils 2 (two) times daily as needed for allergies or rhinitis. 48 g 1   furosemide  (LASIX ) 40 MG tablet TAKE 1 TABLET BY MOUTH DAILY 90 tablet 1   glucose blood test strip Check blood sugar twice daily 100 each 11   meloxicam  (MOBIC ) 15 MG tablet TAKE 1/2 TO 1 TABLET BY MOUTH EVERY DAY AS NEEDED 30 tablet 2   Multiple Vitamins-Minerals (MULTIVITAMIN ADULT PO) Take 1 tablet by mouth daily. flintstone vitamins     RABEprazole  (ACIPHEX ) 20 MG tablet Take 1 tablet (20 mg total) by mouth daily. 90 tablet 3   tirzepatide  (MOUNJARO ) 10 MG/0.5ML Pen Inject 10 mg into the skin once a week. 6 mL 2   Vitamin D , Ergocalciferol , (DRISDOL ) 1.25 MG (50000 UNIT) CAPS capsule Take 1 capsule (50,000 Units total) by mouth every 7 (seven) days. 12 capsule 1   No current facility-administered medications for this visit.    Allergies  Allergen Reactions   Ace Inhibitors Anaphylaxis    Bowel angioedema   Cheese Nausea And Vomiting   Red Dye #40 (Allura Red) Nausea And Vomiting   Tuna [Fish Allergy ] Nausea And Vomiting    REVIEW OF SYSTEMS (negative unless checked):   Cardiac:  []  Chest pain or chest pressure? []  Shortness of breath upon activity? []  Shortness of breath when lying flat? []  Irregular heart rhythm?  Vascular:  []  Pain in calf, thigh, or hip brought on by walking? []  Pain in feet at night that wakes you up from your sleep? []  Blood clot in your veins? [x]  Leg swelling?  Pulmonary:   []  Oxygen at home? []  Productive cough? []  Wheezing?  Neurologic:  []  Sudden weakness in arms or legs? []  Sudden numbness in arms or legs? []  Sudden onset of difficult speaking or slurred speech? []  Temporary loss of vision in one eye? []  Problems with dizziness?  Gastrointestinal:  []  Blood in stool? []  Vomited blood?  Genitourinary:  []  Burning when urinating? []  Blood in urine?  Psychiatric:  []  Major depression  Hematologic:  []  Bleeding problems? []  Problems with blood clotting?  Dermatologic:  []  Rashes or ulcers?  Constitutional:  []  Fever or chills?  Ear/Nose/Throat:  []  Change in hearing? []  Nose bleeds? []  Sore throat?  Musculoskeletal:  []  Back pain? [x]  Joint pain? []  Muscle pain?   Physical Examination     Vitals:   01/06/24 1219  BP: 131/85  Pulse: 61  Temp: 97.7 F (36.5 C)  TempSrc: Temporal  Weight: (!) 315 lb 11.2 oz (143.2 kg)   Body mass index is 50.96 kg/m.  General:  WDWN in NAD; vital signs documented above Gait: Not observed HENT: WNL, normocephalic Pulmonary: normal non-labored breathing , without Rales, rhonchi,  wheezing Cardiac: Regular Abdomen: soft, NT, no masses Skin: without rashes Vascular Exam/Pulses: Brisk DP/PT Doppler signals bilaterally Extremities: Bilateral lower extremities without varicose veins, without reticular veins, with edema, with RLE stasis pigmentation, without lipodermatosclerosis, without ulcers Musculoskeletal: no muscle wasting or atrophy  Neurologic: A&O X 3;  No focal weakness or paresthesias are detected Psychiatric:  The pt has Normal affect.    Non-invasive Vascular Imaging   RLE Venous Insufficiency Duplex (01/06/2024):  +--------------+---------+------+-----------+------------+--------+  RIGHT  Reflux NoRefluxReflux TimeDiameter cmsComments                          Yes                                    +--------------+---------+------+-----------+------------+--------+  CFV          no                                              +--------------+---------+------+-----------+------------+--------+  FV mid        no                                              +--------------+---------+------+-----------+------------+--------+  Popliteal    no                                              +--------------+---------+------+-----------+------------+--------+  GSV at SFJ              yes    >500 ms      0.73              +--------------+---------+------+-----------+------------+--------+  GSV prox thigh          yes    >500 ms      0.54              +--------------+---------+------+-----------+------------+--------+  GSV mid thigh no                            0.36              +--------------+---------+------+-----------+------------+--------+  GSV dist thighno                            0.27              +--------------+---------+------+-----------+------------+--------+  GSV at knee   no                            0.38              +--------------+---------+------+-----------+------------+--------+  GSV prox calf no                                    NV        +--------------+---------+------+-----------+------------+--------+  GSV mid calf  no                                    NV        +--------------+---------+------+-----------+------------+--------+  SSV at [facility]    no                            0.28              +--------------+---------+------+-----------+------------+--------+  SSV prox calf no                                    NV        +--------------+---------+------+-----------+------------+--------+  SSV mid calf  no                                    NV        +--------------+---------+------+-----------+------------+--------+    Medical Decision Making   Hiliana P Mangus is a 63  y.o. female who presents for evaluation of leg edema  Based on the patient's duplex, there is reflux in the right greater saphenous vein at the saphenofemoral junction and proximal thigh. The remainder of the patient's deep and superficial venous system on the right is competent.  There is no evidence of DVT or SVT.  She would not be a candidate for saphenous vein ablation. The patient endorses gradual worsening of bilateral lower extremity edema over the past several years. She notes that her edema never fully goes away however it is usually better in the mornings. Her swelling gets worse after spending several hours on her feet at work. This causes her legs to feel very heavy and achy She also endorses ongoing left thigh weakness and burning pain, which is aggravated by prolonged sitting and moving from a sit to stand position. This does not appear related to her edema. I agree that this is orthopedic in nature and could be due to suspected spinal stenosis She notes that her edema is controlled moderately by compression sleeves on her calves. She does try to elevate her legs in the evening On exam she has brisk doppler signals in both feet. She has significant edema of her lower legs, ankles, and feet. This seems consistent with lymphedema. I have explained to the patient that this can cause her symptoms including swelling and associated discomfort. She is not a candidate for any procedures. I have recommended conservative therapy for edema control, including avoiding prolonged sitting and standing, use of compression stockings, leg elevation, and weight loss. We will attempt to special order the patient a pair of thigh highs stockings since she would like to try them.  She can follow up with our office as needed  Ahmed SHAUNNA Holster, PA-C [facility] Office: [phone]  01/06/2024, 12:57 PM  Clinic MD: Sheree

## 2024-01-07 ENCOUNTER — Telehealth: Payer: Self-pay | Admitting: *Deleted

## 2024-01-07 NOTE — Telephone Encounter (Signed)
 Unable to speak to patient. Left telephone voice message informing Tammy [name] that I was unable to find compression hose to fit her.  I reached out to my [employer] representative that has access to many vendors.

## 2024-01-13 ENCOUNTER — Other Ambulatory Visit: Payer: Self-pay | Admitting: Family Medicine

## 2024-01-13 MED ORDER — DIAZEPAM 5 MG PO TABS: ORAL_TABLET | ORAL | 0 refills | Status: AC

## 2024-01-13 NOTE — Addendum Note (Signed)
 Addended by: MARTHA BOUCHARD E on: 01/13/2024 03:42 PM   Modules accepted: Orders

## 2024-01-13 NOTE — Progress Notes (Signed)
 Could not complete MRI with 5mg  valium  and in MRI truck.  MRI moved to [place] instead and will advise 10mg  valium .

## 2024-01-15 ENCOUNTER — Ambulatory Visit (INDEPENDENT_AMBULATORY_CARE_PROVIDER_SITE_OTHER): Admitting: Family Medicine

## 2024-01-15 ENCOUNTER — Encounter: Payer: Self-pay | Admitting: Family Medicine

## 2024-01-15 ENCOUNTER — Ambulatory Visit (HOSPITAL_BASED_OUTPATIENT_CLINIC_OR_DEPARTMENT_OTHER)
Admission: RE | Admit: 2024-01-15 | Discharge: 2024-01-15 | Disposition: A | Discharge status: Home / Self Care | Source: Ambulatory Visit | Attending: Family Medicine | Admitting: Family Medicine

## 2024-01-15 VITALS — BP 138/84 | HR 64 | Temp 98.2°F | Resp 18 | Ht 66.0 in | Wt 312.8 lb

## 2024-01-15 DIAGNOSIS — G8929 Other chronic pain: Secondary | ICD-10-CM | POA: Insufficient documentation

## 2024-01-15 DIAGNOSIS — M25562 Pain in left knee: Secondary | ICD-10-CM | POA: Diagnosis not present

## 2024-01-15 DIAGNOSIS — E1165 Type 2 diabetes mellitus with hyperglycemia: Secondary | ICD-10-CM | POA: Diagnosis not present

## 2024-01-15 DIAGNOSIS — M25552 Pain in left hip: Secondary | ICD-10-CM | POA: Insufficient documentation

## 2024-01-15 DIAGNOSIS — Z7985 Long-term (current) use of injectable non-insulin antidiabetic drugs: Secondary | ICD-10-CM

## 2024-01-15 MED ORDER — TIRZEPATIDE 12.5 MG/0.5ML ~~LOC~~ SOAJ
12.5000 mg | SUBCUTANEOUS | 3 refills | Status: AC
Start: 2024-01-15 — End: ?

## 2024-01-15 NOTE — Progress Notes (Signed)
 p  Subjective:    Patient ID: Tammy [name], female    [name]: 1961/02/03, 63 y.o.   MRN: [id number]  Chief Complaint  Patient presents with   Leg Pain    Bilateral leg pain, mostly left leg.     HPI Patient is in today for pain in leg and back. Discussed the use of AI scribe software for clinical note transcription with the patient, who gave verbal consent to proceed.  History of Present Illness Tammy [name] is a 63 year old female who presents with persistent leg pain and difficulty walking.  She has been experiencing significant pain in her legs, particularly affecting her knees and extending upwards, for the past three weeks. The pain is severe enough to impact her mobility, causing her to drag her legs and feel as though she is walking like she is [age]. The pain began after attending physical therapy sessions, where she felt the exercises worsened her condition.  Her left leg is particularly affected, requiring her to drag it due to weakness and pain. The pain is both constant and exacerbated by weight-bearing activities, causing her knees to give out. She has previously tried prednisone  without relief and is currently considering tramadol  for pain management. She also uses turmeric and ginger supplements, which she feels provide some relief.  She has a history of arthritis, particularly in her left knee, which was operated on three years ago. Her knee pain has worsened, with pressure and knots forming around the knees. She also experiences pain radiating from her back down to her legs.  She has a history of claustrophobia, which complicates her ability to undergo MRI scans. She attempted an MRI with the aid of diazepam  but found the experience distressing and ineffective in alleviating her anxiety. She is awaiting another MRI appointment at a facility where she previously had a successful experience.  She reports difficulty sleeping due to pain, which contributes to neck and  back discomfort. Her weight has increased slightly, which she attributes to stress and difficulty maintaining her usual dietary habits. She is concerned about the impact of her weight on her back pain and overall mobility.  She has previously undergone a circulation test for her legs, which was completed without issue. She is also considering lifestyle changes, such as fasting, to manage her symptoms and improve her overall health.    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   GERD (gastroesophageal reflux disease)    History of colon polyps    Hyperlipidemia    Hypertension    Joint pain    Lactose intolerance    Morbid obesity - BMI > 50 12/13/2012   Pneumonia    none recent   PONV (postoperative nausea and vomiting)    Poor venous access    usually difficult stick   Prediabetes    Sinusitis    SOB (shortness of breath)    Swelling    Vitamin D  deficiency     Past Surgical History:  Procedure Laterality Date   ABDOMINAL HYSTERECTOMY  APPENDECTOMY     2'14   COLONOSCOPY  04/11/11   5 mm polyp removed but not recovered   COLONOSCOPY WITH PROPOFOL  N/A 05/11/2017   Procedure: COLONOSCOPY WITH PROPOFOL ;  Surgeon: Avram Lupita BRAVO, MD;  Location: [facility];  Service: Endoscopy;  Laterality: N/A;   GASTRIC ROUX-EN-Y N/A 02/27/2015   Procedure: LAPAROSCOPIC ROUX-EN-Y GASTRIC BYPASS WITH UPPER ENDOSCOPY;  Surgeon: Donnice Lunger, MD;  Location: [facility];  Service: General;  Laterality: N/A;   KNEE SURGERY Right     KNEE SURGERY    LAPAROSCOPIC APPENDECTOMY  06/10/2012   Procedure: APPENDECTOMY LAPAROSCOPIC;  Surgeon:  Elspeth KYM Schultze, MD;  Location: [facility];  Service: General;  Laterality: N/A;   UPPER GASTROINTESTINAL ENDOSCOPY  04/14/2006   Normal    Family History  Problem Relation Age of Onset   Hyperlipidemia Mother    Hypertension Mother    Hodgkin's lymphoma Mother 69   Diabetes Father    Heart failure Father    Heart disease Father    Breast cancer Maternal Aunt    Breast cancer Maternal Aunt    Diabetes Paternal Grandmother    Heart disease Cousin    Hyperlipidemia Brother    Colon cancer Neg Hx    Heart attack Neg Hx    Sudden death Neg Hx    Colon polyps Neg Hx    Esophageal cancer Neg Hx    Stomach cancer Neg Hx    Rectal cancer Neg Hx     Social History   Socioeconomic History   Marital status: Married    Spouse name: Engineer, civil (consulting)   Number of children: 0   Years of education: Not on file   Highest education level: Some college, no degree  Occupational History   Occupation: FAB Set designer: [employer]  Tobacco Use   Smoking status: Never   Smokeless tobacco: Never  Vaping Use   Vaping status: Never Used  Substance and Sexual Activity   Alcohol use: No    Alcohol/week: 0.0 standard drinks of alcohol   Drug use: No   Sexual activity: Not on file  Other Topics Concern   Not on file  Social History Narrative   Married, works at [employer]   No children   No alcohol tobacco or drug use   Social Drivers of Corporate investment banker Strain: Low Risk  (11/03/2023)   Overall Financial Resource Strain (CARDIA)    Difficulty of Paying Living Expenses: Not hard at all  Food Insecurity: No Food Insecurity (11/03/2023)   Hunger Vital Sign    Worried About Running Out of Food in the Last Year: Never true    Ran Out of Food in the Last Year: Never true  Transportation Needs: No Transportation Needs (11/03/2023)   PRAPARE - Administrator, Civil Service (Medical): No    Lack of Transportation (Non-Medical): No  Physical Activity: Insufficiently  Active (11/03/2023)   Exercise Vital Sign    Days of Exercise per Week: 2 days    Minutes of Exercise per Session: 30 min  Stress: No Stress Concern Present (11/03/2023)   [employer] of Occupational Health - Occupational Stress Questionnaire    Feeling of Stress: Only a little  Social Connections: Socially Integrated (11/03/2023)   Social Connection and Isolation Panel    Frequency of Communication with Friends and Family: Three times a week    Frequency of Social Gatherings with Friends and Family: Once a week  Attends Religious Services: More than 4 times per year    Active Member of Clubs or Organizations: Yes    Attends Banker Meetings: More than 4 times per year    Marital Status: Married  Catering manager Violence: Unknown (12/10/2021)   Received from [facility]   HITS    Physically Hurt: Not on file    Insult or Talk Down To: Not on file    Threaten Physical Harm: Not on file    Scream or Curse: Not on file    Outpatient Medications Prior to Visit  Medication Sig Dispense Refill   amLODipine  (NORVASC ) 2.5 MG tablet Take 1 tablet (2.5 mg total) by mouth 2 (two) times daily. 180 tablet 3   carvedilol  (COREG ) 12.5 MG tablet Take 1 tablet (12.5 mg total) by mouth 2 (two) times daily with a meal. 180 tablet 3   diazepam  (VALIUM ) 5 MG tablet Take 2 tablets 30 minutes prior to procedure. 2 tablet 0   fluticasone  (FLONASE ) 50 MCG/ACT nasal spray Place 1 spray into both nostrils 2 (two) times daily as needed for allergies or rhinitis. 48 g 1   furosemide  (LASIX ) 40 MG tablet TAKE 1 TABLET BY MOUTH DAILY 90 tablet 1   glucose blood test strip Check blood sugar twice daily 100 each 11   Multiple Vitamins-Minerals (MULTIVITAMIN ADULT PO) Take 1 tablet by mouth daily. flintstone vitamins     RABEprazole  (ACIPHEX ) 20 MG tablet Take 1 tablet (20 mg total) by mouth daily. 90 tablet 3   traMADol  (ULTRAM ) 50 MG tablet Take 1 tablet (50 mg total) by mouth every 8 (eight) hours  as needed.     meloxicam  (MOBIC ) 15 MG tablet TAKE 1/2 TO 1 TABLET BY MOUTH EVERY DAY AS NEEDED 30 tablet 2   tirzepatide  (MOUNJARO ) 10 MG/0.5ML Pen Inject 10 mg into the skin once a week. 6 mL 2   baclofen  (LIORESAL ) 10 MG tablet Take 1 tablet (10 mg total) by mouth 3 (three) times daily as needed for muscle spasms. 60 tablet 1   Vitamin D , Ergocalciferol , (DRISDOL ) 1.25 MG (50000 UNIT) CAPS capsule Take 1 capsule (50,000 Units total) by mouth every 7 (seven) days. 12 capsule 1   No facility-administered medications prior to visit.    Allergies  Allergen Reactions   Ace Inhibitors Anaphylaxis    Bowel angioedema   Cheese Nausea And Vomiting   Red Dye #40 (Allura Red) Nausea And Vomiting   Tuna [Fish Allergy ] Nausea And Vomiting    Review of Systems  Constitutional:  Negative for chills, fever and malaise/fatigue.  HENT:  Negative for congestion and hearing loss.   Eyes:  Negative for discharge.  Respiratory:  Negative for cough, sputum production and shortness of breath.   Cardiovascular:  Negative for chest pain, palpitations and leg swelling.  Gastrointestinal:  Negative for abdominal pain, blood in stool, constipation, diarrhea, heartburn, nausea and vomiting.  Genitourinary:  Negative for dysuria, frequency, hematuria and urgency.  Musculoskeletal:  Positive for back pain, joint pain and myalgias. Negative for falls.  Skin:  Negative for rash.  Neurological:  Negative for dizziness, sensory change, loss of consciousness, weakness and headaches.  Endo/Heme/Allergies:  Negative for environmental allergies. Does not bruise/bleed easily.  Psychiatric/Behavioral:  Negative for depression and suicidal ideas. The patient is not nervous/anxious and does not have insomnia.        Objective:    Physical Exam Vitals and nursing note reviewed.  Constitutional:      General:  She is not in acute distress.    Appearance: Normal appearance. She is well-developed.  HENT:     Head:  Normocephalic and atraumatic.  Eyes:     General: No scleral icterus.       Right eye: No discharge.        Left eye: No discharge.  Cardiovascular:     Rate and Rhythm: Normal rate and regular rhythm.     Heart sounds: No murmur heard. Pulmonary:     Effort: Pulmonary effort is normal. No respiratory distress.     Breath sounds: Normal breath sounds.  Musculoskeletal:        General: Tenderness present.     Cervical back: Normal range of motion and neck supple.     Left upper leg: Tenderness and bony tenderness present.     Right knee: Normal range of motion. No tenderness.     Left knee: Decreased range of motion. Tenderness present.     Right lower leg: Swelling and tenderness present. Edema present.     Left lower leg: Swelling present. Edema present.  Skin:    General: Skin is warm and dry.  Neurological:     Mental Status: She is alert and oriented to person, place, and time.     Motor: Weakness present.  Psychiatric:        Mood and Affect: Mood normal.        Behavior: Behavior normal.        Thought Content: Thought content normal.        Judgment: Judgment normal.     BP 138/84 (BP Location: Right Arm, Patient Position: Sitting, Cuff Size: Large)   Pulse 64   Temp 98.2 F (36.8 C) (Oral)   Resp 18   Ht 5' 6 (1.676 m)   Wt (!) 312 lb 12.8 oz (141.9 kg)   SpO2 98%   BMI 50.49 kg/m  Wt Readings from Last 3 Encounters:  01/15/24 (!) 312 lb 12.8 oz (141.9 kg)  01/06/24 (!) 315 lb 11.2 oz (143.2 kg)  11/03/23 (!) 307 lb 9.6 oz (139.5 kg)    Diabetic Foot Exam - Simple   No data filed    Lab Results  Component Value Date   WBC 2.5 (L) 08/06/2023   HGB 12.3 08/06/2023   HCT 37.2 08/06/2023   PLT 257.0 08/06/2023   GLUCOSE 82 08/06/2023   CHOL 185 08/06/2023   TRIG 57.0 08/06/2023   HDL 70.60 08/06/2023   LDLDIRECT 129.1 10/17/2009   LDLCALC 103 (H) 08/06/2023   ALT 17 08/06/2023   AST 18 08/06/2023   NA 143 08/06/2023   K 4.0 08/06/2023   CL 103  08/06/2023   CREATININE 0.95 08/06/2023   BUN 13 08/06/2023   CO2 29 08/06/2023   TSH 2.13 02/09/2023   INR 1.1 (H) 10/16/2014   HGBA1C 5.7 08/06/2023   MICROALBUR 0.9 08/06/2023    Lab Results  Component Value Date   TSH 2.13 02/09/2023   Lab Results  Component Value Date   WBC 2.5 (L) 08/06/2023   HGB 12.3 08/06/2023   HCT 37.2 08/06/2023   MCV 87.4 08/06/2023   PLT 257.0 08/06/2023   Lab Results  Component Value Date   NA 143 08/06/2023   K 4.0 08/06/2023   CO2 29 08/06/2023   GLUCOSE 82 08/06/2023   BUN 13 08/06/2023   CREATININE 0.95 08/06/2023   BILITOT 0.8 08/06/2023   ALKPHOS 73 08/06/2023   AST 18 08/06/2023  ALT 17 08/06/2023   PROT 6.6 08/06/2023   ALBUMIN 4.0 08/06/2023   CALCIUM  8.8 08/06/2023   ANIONGAP 5 04/22/2018   GFR 64.21 08/06/2023   Lab Results  Component Value Date   CHOL 185 08/06/2023   Lab Results  Component Value Date   HDL 70.60 08/06/2023   Lab Results  Component Value Date   LDLCALC 103 (H) 08/06/2023   Lab Results  Component Value Date   TRIG 57.0 08/06/2023   Lab Results  Component Value Date   CHOLHDL 3 08/06/2023   Lab Results  Component Value Date   HGBA1C 5.7 08/06/2023       Assessment & Plan:  Left hip pain -     DG HIP UNILAT W OR W/O PELVIS MIN 4 VIEWS LEFT; Future  Chronic pain of left knee -     DG Knee Complete 4 Views Left; Future  Type 2 diabetes mellitus with hyperglycemia, without long-term current use of insulin  (HCC) -     Tirzepatide ; Inject 12.5 mg into the skin once a week.  Dispense: 6 mL; Refill: 3   Assessment and Plan Assessment & Plan Chronic low back pain   Chronic low back pain has worsened since July, possibly due to physical therapy. The pain is severe, affecting sleep and mobility, and may contribute to knee pain. Differential diagnosis includes disc issues or stenosis. Previous prednisone  treatment was ineffective. An MRI is pending to evaluate the cause, but she is  claustrophobic and had difficulty with previous MRI attempts. Diazepam  sedation was ineffective, so alternative MRI arrangements are being made. Plan: Order MRI of the back, consider tramadol  for pain management, and discuss potential for back injections if MRI shows disc or stenosis issues.  Chronic left knee pain   Chronic left knee pain is exacerbated by weight-bearing and possibly related to back issues. Previous gel injections were effective. The pain is severe, affecting mobility and daily activities, with difficulty in weight-bearing and knee instability, possibly due to pressure from back issues. Plan: Order X-ray of the left knee, consider tramadol  for pain management, and evaluate MRI results to determine if knee pain is related to back issues.  Chronic left hip pain   Chronic left hip pain is possibly related to back issues. The pain is severe and affects mobility, with difficulty in weight-bearing. Plan: Order X-ray of the left hip and evaluate MRI results to determine if hip pain is related to back issues.  Obesity   Obesity with recent weight gain is likely due to increased pain and decreased mobility, affecting back pain and overall health. She is aware of the impact of weight on her condition and is considering dietary changes. Plan: Encourage weight management strategies and discuss potential benefits of weight loss on back pain.  Claustrophobia   Claustrophobia complicates MRI procedures. Previous sedation with diazepam  was ineffective. Alternative arrangements are being made for MRI at a facility with larger MRI machines or alternative sedation options.     Zale Marcotte R Lowne Chase, DO

## 2024-01-22 ENCOUNTER — Ambulatory Visit: Payer: Self-pay | Admitting: Family Medicine

## 2024-01-26 ENCOUNTER — Ambulatory Visit (HOSPITAL_COMMUNITY)
Admission: RE | Admit: 2024-01-26 | Discharge: 2024-01-26 | Disposition: A | Discharge status: Home / Self Care | Source: Ambulatory Visit | Attending: Family Medicine | Admitting: Family Medicine

## 2024-01-26 DIAGNOSIS — M79605 Pain in left leg: Secondary | ICD-10-CM | POA: Insufficient documentation

## 2024-01-26 DIAGNOSIS — M5416 Radiculopathy, lumbar region: Secondary | ICD-10-CM | POA: Diagnosis present

## 2024-01-26 DIAGNOSIS — M79604 Pain in right leg: Secondary | ICD-10-CM | POA: Insufficient documentation

## 2024-01-28 ENCOUNTER — Ambulatory Visit: Payer: Self-pay | Admitting: Family Medicine

## 2024-02-03 ENCOUNTER — Ambulatory Visit (INDEPENDENT_AMBULATORY_CARE_PROVIDER_SITE_OTHER): Admitting: Family Medicine

## 2024-02-03 VITALS — BP 138/61 | Ht 66.0 in | Wt 312.0 lb

## 2024-02-03 DIAGNOSIS — R29898 Other symptoms and signs involving the musculoskeletal system: Secondary | ICD-10-CM

## 2024-02-03 DIAGNOSIS — M79604 Pain in right leg: Secondary | ICD-10-CM

## 2024-02-03 DIAGNOSIS — M79605 Pain in left leg: Secondary | ICD-10-CM | POA: Diagnosis not present

## 2024-02-03 NOTE — Patient Instructions (Signed)
 Your MRI is reassuring. We will go ahead with a referral to neurology. Take tylenol  2 tabs three times a day regularly. Ok to take aleve  1-2 tabs twice a day with food in addition to this if needed.

## 2024-02-03 NOTE — Progress Notes (Signed)
 PCP: [name], DO  Subjective:   HPI: Patient is a 63 y.o. female here for bilateral leg pain.  6/26: Tammy [name] was last seen on 10/12/2023 where she was started on a prednisone  taper and baclofen  for suspected spinal stenosis. Since then she has had improvement in her right leg symptoms but persistence of her left leg pain. She states that the prednisone  provided her with decent pain relief when she was on the taper, but after discontinuing her pain returned. The baclofen  has not led to a significant benefit and caused her to be fatigued. She describes her pain as being both anterior and lateral thigh, worsened with movement from sit to stand and laying in bed. Her pain is also bad when she sits for a prolonged period of time. She is not having any radiation of pain past the knee nor up into the spine. She is not having any numbness, tingling, burning.  10/1: Patient reports she continues to struggle with pain in both legs. Left is worse than the right. Feels unsteady and that the left leg is weak. Has been taking tylenol  3 tabs once a day. Pain worse as the day goes on. Feels burning in right knee. Has diabetes but is well controlled.  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   GERD (gastroesophageal reflux disease)    History of colon polyps    Hyperlipidemia    Hypertension    Joint pain    Lactose intolerance    Morbid obesity - BMI > 50 12/13/2012   Pneumonia    none recent   PONV (postoperative nausea and vomiting)    Poor venous access    usually difficult stick   Prediabetes    Sinusitis    SOB  (shortness of breath)    Swelling    Vitamin D  deficiency     Current Outpatient Medications on File Prior to Visit  Medication Sig Dispense Refill   amLODipine  (NORVASC ) 2.5 MG tablet Take 1 tablet (2.5 mg total) by mouth 2 (two) times daily. 180 tablet 3   carvedilol  (COREG ) 12.5 MG tablet Take 1 tablet (12.5 mg total) by mouth 2 (two) times daily with a meal. 180 tablet 3   diazepam  (VALIUM ) 5 MG tablet Take 2 tablets 30 minutes prior to procedure. 2 tablet 0   fluticasone  (FLONASE ) 50 MCG/ACT nasal spray Place 1 spray into both nostrils 2 (two) times daily as needed for allergies or rhinitis. 48 g 1   furosemide  (LASIX ) 40 MG tablet TAKE 1 TABLET BY MOUTH DAILY 90 tablet 1   glucose blood test strip Check blood sugar twice daily 100 each 11   Multiple Vitamins-Minerals (MULTIVITAMIN ADULT PO) Take 1 tablet by mouth daily. flintstone vitamins     RABEprazole  (ACIPHEX ) 20 MG tablet Take 1 tablet (20 mg total) by mouth daily. 90 tablet 3   tirzepatide  (MOUNJARO ) 12.5 MG/0.5ML Pen Inject 12.5 mg into the skin once a week. 6 mL 3   traMADol  (ULTRAM ) 50 MG tablet Take 1 tablet (50 mg total) by mouth  every 8 (eight) hours as needed.     No current facility-administered medications on file prior to visit.    Past Surgical History:  Procedure Laterality Date   ABDOMINAL HYSTERECTOMY     APPENDECTOMY     2'14   COLONOSCOPY  04/11/11   5 mm polyp removed but not recovered   COLONOSCOPY WITH PROPOFOL  N/A 05/11/2017   Procedure: COLONOSCOPY WITH PROPOFOL ;  Surgeon: Avram Lupita BRAVO, MD;  Location: [facility];  Service: Endoscopy;  Laterality: N/A;   GASTRIC ROUX-EN-Y N/A 02/27/2015   Procedure: LAPAROSCOPIC ROUX-EN-Y GASTRIC BYPASS WITH UPPER ENDOSCOPY;  Surgeon: Donnice Lunger, MD;  Location: [facility];  Service: General;  Laterality: N/A;   KNEE SURGERY Right     KNEE SURGERY    LAPAROSCOPIC APPENDECTOMY  06/10/2012   Procedure: APPENDECTOMY LAPAROSCOPIC;  Surgeon: Elspeth KYM Schultze, MD;   Location: [facility];  Service: General;  Laterality: N/A;   UPPER GASTROINTESTINAL ENDOSCOPY  04/14/2006   Normal    Allergies  Allergen Reactions   Ace Inhibitors Anaphylaxis    Bowel angioedema   Cheese Nausea And Vomiting   Red Dye #40 (Allura Red) Nausea And Vomiting   Tuna [Fish Allergy ] Nausea And Vomiting    BP 138/61   Ht 5' 6 (1.676 m)   Wt (!) 312 lb (141.5 kg)   BMI 50.36 kg/m       No data to display              No data to display              Objective:  Physical Exam:  Gen: NAD, comfortable in exam room  Back/bilateral legs: No gross deformity, scoliosis. TTP through bilateral legs.   Strength LEs 5/5 all muscle groups except 4/5 with left hip flexion, 3+/5 knee extension.   Negative SLRs. Sensation intact to light touch bilaterally.   Assessment & Plan:  1. Bilateral leg pain/weakness - patient's lumbar spine MRI is reassuring - no evidence of spinal stenosis.  Concerning that she's having weakness on the left side.  While this may be due to deconditioning and we discussed this, she does not have similar weakness on the right.  Encouraged to take tylenol  more regularly, three times a day.  Aleve  if needed in addition to this.  Will refer to neurology to evaluate her lower extremity weakness.  Does not have foot drop to warrant AFO/boot.

## 2024-02-23 ENCOUNTER — Ambulatory Visit: Admitting: Family Medicine

## 2024-02-23 ENCOUNTER — Encounter: Payer: Self-pay | Admitting: Family Medicine

## 2024-02-23 VITALS — BP 126/78 | HR 83 | Temp 98.2°F | Resp 18 | Ht 66.0 in | Wt 300.2 lb

## 2024-02-23 DIAGNOSIS — Z0000 Encounter for general adult medical examination without abnormal findings: Secondary | ICD-10-CM

## 2024-02-23 DIAGNOSIS — E1165 Type 2 diabetes mellitus with hyperglycemia: Secondary | ICD-10-CM | POA: Diagnosis not present

## 2024-02-23 DIAGNOSIS — I10 Essential (primary) hypertension: Secondary | ICD-10-CM | POA: Diagnosis not present

## 2024-02-23 DIAGNOSIS — M79605 Pain in left leg: Secondary | ICD-10-CM

## 2024-02-23 DIAGNOSIS — G8929 Other chronic pain: Secondary | ICD-10-CM

## 2024-02-23 DIAGNOSIS — R531 Weakness: Secondary | ICD-10-CM

## 2024-02-23 DIAGNOSIS — E785 Hyperlipidemia, unspecified: Secondary | ICD-10-CM | POA: Diagnosis not present

## 2024-02-23 DIAGNOSIS — Z23 Encounter for immunization: Secondary | ICD-10-CM | POA: Diagnosis not present

## 2024-02-23 DIAGNOSIS — M79604 Pain in right leg: Secondary | ICD-10-CM

## 2024-02-23 DIAGNOSIS — M51362 Other intervertebral disc degeneration, lumbar region with discogenic back pain and lower extremity pain: Secondary | ICD-10-CM

## 2024-02-23 DIAGNOSIS — J302 Other seasonal allergic rhinitis: Secondary | ICD-10-CM

## 2024-02-23 LAB — CBC WITH DIFFERENTIAL/PLATELET
Basophils Absolute: 0 K/uL (ref 0.0–0.1)
Basophils Relative: 0.9 % (ref 0.0–3.0)
Eosinophils Absolute: 0 K/uL (ref 0.0–0.7)
Eosinophils Relative: 1.9 % (ref 0.0–5.0)
HCT: 40 % (ref 36.0–46.0)
Hemoglobin: 12.9 g/dL (ref 12.0–15.0)
Lymphocytes Relative: 36.6 % (ref 12.0–46.0)
Lymphs Abs: 0.9 K/uL (ref 0.7–4.0)
MCHC: 32.2 g/dL (ref 30.0–36.0)
MCV: 86.7 fl (ref 78.0–100.0)
Monocytes Absolute: 0.2 K/uL (ref 0.1–1.0)
Monocytes Relative: 7.6 % (ref 3.0–12.0)
Neutro Abs: 1.3 K/uL — ABNORMAL LOW (ref 1.4–7.7)
Neutrophils Relative %: 53 % (ref 43.0–77.0)
Platelets: 270 K/uL (ref 150.0–400.0)
RBC: 4.61 Mil/uL (ref 3.87–5.11)
RDW: 14.9 % (ref 11.5–15.5)
WBC: 2.4 K/uL — ABNORMAL LOW (ref 4.0–10.5)

## 2024-02-23 LAB — COMPREHENSIVE METABOLIC PANEL WITH GFR
ALT: 16 U/L (ref 0–35)
AST: 20 U/L (ref 0–37)
Albumin: 4.3 g/dL (ref 3.5–5.2)
Alkaline Phosphatase: 80 U/L (ref 39–117)
BUN: 11 mg/dL (ref 6–23)
CO2: 29 meq/L (ref 19–32)
Calcium: 8.7 mg/dL (ref 8.4–10.5)
Chloride: 104 meq/L (ref 96–112)
Creatinine, Ser: 0.88 mg/dL (ref 0.40–1.20)
GFR: 70.12 mL/min (ref >60.00)
Glucose, Bld: 93 mg/dL (ref 70–99)
Potassium: 3.9 meq/L (ref 3.5–5.1)
Sodium: 142 meq/L (ref 135–145)
Total Bilirubin: 0.7 mg/dL (ref 0.2–1.2)
Total Protein: 7 g/dL (ref 6.0–8.3)

## 2024-02-23 LAB — VITAMIN B12: Vitamin B-12: 203 pg/mL — ABNORMAL LOW (ref 211–911)

## 2024-02-23 LAB — HEMOGLOBIN A1C: Hgb A1c MFr Bld: 5.5 % (ref 4.6–6.5)

## 2024-02-23 LAB — TSH: TSH: 2.27 u[IU]/mL (ref 0.35–5.50)

## 2024-02-23 LAB — LIPID PANEL
Cholesterol: 186 mg/dL (ref 0–200)
HDL: 69.4 mg/dL (ref >39.00)
LDL Cholesterol: 106 mg/dL — ABNORMAL HIGH (ref 0–99)
NonHDL: 116.84
Total CHOL/HDL Ratio: 3
Triglycerides: 55 mg/dL (ref 0.0–149.0)
VLDL: 11 mg/dL (ref 0.0–40.0)

## 2024-02-23 LAB — VITAMIN D 25 HYDROXY (VIT D DEFICIENCY, FRACTURES): VITD: 16.7 ng/mL — ABNORMAL LOW (ref 30.00–100.00)

## 2024-02-23 MED ORDER — DULOXETINE HCL 30 MG PO CPEP
30.0000 mg | ORAL_CAPSULE | Freq: Every day | ORAL | 2 refills | Status: AC
Start: 2024-02-23 — End: ?

## 2024-02-23 MED ORDER — FLUTICASONE PROPIONATE 50 MCG/ACT NA SUSP: 1.0000 | Freq: Two times a day (BID) | NASAL | 5 refills | Status: AC | PRN

## 2024-02-23 NOTE — Progress Notes (Signed)
 Subjective:    Patient ID: Tammy [name], female    [name]: 30-May-1960, 63 y.o.   MRN: [id number]  Chief Complaint  Patient presents with   Annual Exam    Pt states fasting    HPI Patient is in today for cpe.  Discussed the use of AI scribe software for clinical note transcription with the patient, who gave verbal consent to proceed.  History of Present Illness Tammy [name] is a 63 year old female with degenerative disc disease and mild stenosis who presents with severe leg pain and mobility issues.  She experiences severe pain primarily in her legs, affecting her knees and walking ability. The pain is persistent and significantly impacts her ability to walk and ride for long periods, as she describes 'everything just locks down.'  She has an upcoming appointment with a neurologist in February. An MRI has shown degenerative disc disease and mild stenosis.  She is currently taking tramadol  for pain management, using it only when the pain becomes unbearable, which provides relief. She is cautious about its use due to concerns about dependency. She is also losing weight, which she attributes to the pain affecting her appetite.  She experiences stress and emotional distress due to the pain, compounded by her job situation as her workplace is closing. Her sleep is significantly disrupted by the pain, as she cannot find a comfortable position. Her legs feel heavy when she lies on her back, requiring her to manually move them.  She has a history of weight loss, having lost twelve pounds recently, and aims to be under three hundred pounds. Her blood pressure is well-controlled despite the pain.  She uses lymphedema pumps for leg swelling, which she finds beneficial. She describes the process as a 'stress relief' and notes that it helps reduce the swelling.    Past Medical History:  Diagnosis Date   Acute on chronic appendicitis s/p lap appy 06/10/2012 06/10/2012   surgery done 06-10-12    Angioedema of intestine due to angiotensin converting enzyme inhibitor (ACE-I)    Arthritis    ankle   Back pain    Chest pain    Complication of anesthesia    Constipation    Diabetes mellitus    TYPE 2- no meds in several months-was taken off meds -monitoring blood sugar.   GERD (gastroesophageal reflux disease)    History of colon polyps    Hyperlipidemia    Hypertension    Joint pain    Lactose intolerance    Morbid obesity - BMI > 50 12/13/2012   Pneumonia    none recent   PONV (postoperative nausea and vomiting)    Poor venous access    usually difficult stick   Prediabetes    Sinusitis    SOB (shortness of breath)    Swelling    Vitamin D  deficiency     Past Surgical History:  Procedure Laterality Date   ABDOMINAL HYSTERECTOMY     APPENDECTOMY     2'14   COLONOSCOPY  04/11/11   5 mm polyp removed but not recovered   COLONOSCOPY WITH PROPOFOL  N/A 05/11/2017   Procedure: COLONOSCOPY WITH PROPOFOL ;  Surgeon: Avram Lupita BRAVO, MD;  Location: [facility];  Service: Endoscopy;  Laterality: N/A;   GASTRIC ROUX-EN-Y N/A 02/27/2015   Procedure: LAPAROSCOPIC ROUX-EN-Y GASTRIC BYPASS WITH UPPER ENDOSCOPY;  Surgeon: Donnice Lunger, MD;  Location: [facility];  Service: General;  Laterality: N/A;   KNEE SURGERY Right     KNEE  SURGERY    LAPAROSCOPIC APPENDECTOMY  06/10/2012   Procedure: APPENDECTOMY LAPAROSCOPIC;  Surgeon: Elspeth KYM Schultze, MD;  Location: [facility];  Service: General;  Laterality: N/A;   UPPER GASTROINTESTINAL ENDOSCOPY  04/14/2006   Normal    Family History  Problem Relation Age of Onset   Hyperlipidemia Mother    Hypertension Mother    Hodgkin's lymphoma Mother 18   Diabetes Father    Heart failure Father    Heart disease Father    Breast cancer Maternal Aunt    Breast cancer Maternal Aunt    Diabetes Paternal Grandmother    Heart disease Cousin    Hyperlipidemia Brother    Colon cancer Neg Hx    Heart attack Neg Hx    Sudden death Neg Hx    Colon  polyps Neg Hx    Esophageal cancer Neg Hx    Stomach cancer Neg Hx    Rectal cancer Neg Hx     Social History   Socioeconomic History   Marital status: Married    Spouse name: Engineer, civil (consulting)   Number of children: 0   Years of education: Not on file   Highest education level: Some college, no degree  Occupational History   Occupation: FAB Set designer: [employer]  Tobacco Use   Smoking status: Never   Smokeless tobacco: Never  Vaping Use   Vaping status: Never Used  Substance and Sexual Activity   Alcohol use: No    Alcohol/week: 0.0 standard drinks of alcohol   Drug use: No   Sexual activity: Not on file  Other Topics Concern   Not on file  Social History Narrative   Married, works at [employer]   No children   No alcohol tobacco or drug use   Social Drivers of Corporate investment banker Strain: Low Risk  (11/03/2023)   Overall Financial Resource Strain (CARDIA)    Difficulty of Paying Living Expenses: Not hard at all  Food Insecurity: No Food Insecurity (11/03/2023)   Hunger Vital Sign    Worried About Running Out of Food in the Last Year: Never true    Ran Out of Food in the Last Year: Never true  Transportation Needs: No Transportation Needs (11/03/2023)   PRAPARE - Administrator, Civil Service (Medical): No    Lack of Transportation (Non-Medical): No  Physical Activity: Insufficiently Active (11/03/2023)   Exercise Vital Sign    Days of Exercise per Week: 2 days    Minutes of Exercise per Session: 30 min  Stress: No Stress Concern Present (11/03/2023)   [employer] of Occupational Health - Occupational Stress Questionnaire    Feeling of Stress: Only a little  Social Connections: Socially Integrated (11/03/2023)   Social Connection and Isolation Panel    Frequency of Communication with Friends and Family: Three times a week    Frequency of Social Gatherings with Friends and Family: Once a week    Attends Religious Services: More than 4  times per year    Active Member of [employer] or Organizations: Yes    Attends Banker Meetings: More than 4 times per year    Marital Status: Married  Catering manager Violence: Unknown (12/10/2021)   Received from [facility]   HITS    Physically Hurt: Not on file    Insult or Talk Down To: Not on file    Threaten Physical Harm: Not on file    Scream or Curse:  Not on file    Outpatient Medications Prior to Visit  Medication Sig Dispense Refill   amLODipine  (NORVASC ) 2.5 MG tablet Take 1 tablet (2.5 mg total) by mouth 2 (two) times daily. 180 tablet 3   carvedilol  (COREG ) 12.5 MG tablet Take 1 tablet (12.5 mg total) by mouth 2 (two) times daily with a meal. 180 tablet 3   diazepam  (VALIUM ) 5 MG tablet Take 2 tablets 30 minutes prior to procedure. 2 tablet 0   furosemide  (LASIX ) 40 MG tablet TAKE 1 TABLET BY MOUTH DAILY 90 tablet 1   glucose blood test strip Check blood sugar twice daily 100 each 11   Multiple Vitamins-Minerals (MULTIVITAMIN ADULT PO) Take 1 tablet by mouth daily. flintstone vitamins     RABEprazole  (ACIPHEX ) 20 MG tablet Take 1 tablet (20 mg total) by mouth daily. 90 tablet 3   tirzepatide  (MOUNJARO ) 12.5 MG/0.5ML Pen Inject 12.5 mg into the skin once a week. 6 mL 3   traMADol  (ULTRAM ) 50 MG tablet Take 1 tablet (50 mg total) by mouth every 8 (eight) hours as needed.     fluticasone  (FLONASE ) 50 MCG/ACT nasal spray Place 1 spray into both nostrils 2 (two) times daily as needed for allergies or rhinitis. 48 g 1   No facility-administered medications prior to visit.    Allergies  Allergen Reactions   Ace Inhibitors Anaphylaxis    Bowel angioedema   Cheese Nausea And Vomiting   Red Dye #40 (Allura Red) Nausea And Vomiting   Tuna [Fish Allergy ] Nausea And Vomiting    Review of Systems  Constitutional:  Negative for chills, fever and malaise/fatigue.  HENT:  Negative for congestion and hearing loss.   Eyes:  Negative for blurred vision and discharge.   Respiratory:  Negative for cough, sputum production and shortness of breath.   Cardiovascular:  Negative for chest pain, palpitations and leg swelling.  Gastrointestinal:  Negative for abdominal pain, blood in stool, constipation, diarrhea, heartburn, nausea and vomiting.  Genitourinary:  Negative for dysuria, frequency, hematuria and urgency.  Musculoskeletal:  Negative for back pain, falls and myalgias.  Skin:  Negative for rash.  Neurological:  Negative for dizziness, sensory change, loss of consciousness, weakness and headaches.  Endo/Heme/Allergies:  Negative for environmental allergies. Does not bruise/bleed easily.  Psychiatric/Behavioral:  Negative for depression and suicidal ideas. The patient is not nervous/anxious and does not have insomnia.        Objective:    Physical Exam Vitals and nursing note reviewed.  Constitutional:      General: She is not in acute distress.    Appearance: Normal appearance. She is well-developed.  HENT:     Head: Normocephalic and atraumatic.     Right Ear: Tympanic membrane, ear canal and external ear normal. There is no impacted cerumen.     Left Ear: Tympanic membrane, ear canal and external ear normal. There is no impacted cerumen.     Nose: Nose normal.     Mouth/Throat:     Mouth: Mucous membranes are moist.     Pharynx: Oropharynx is clear. No oropharyngeal exudate or posterior oropharyngeal erythema.  Eyes:     General: No scleral icterus.       Right eye: No discharge.        Left eye: No discharge.     Conjunctiva/sclera: Conjunctivae normal.     Pupils: Pupils are equal, round, and reactive to light.  Neck:     Thyroid : No thyromegaly or thyroid  tenderness.  Vascular: No JVD.  Cardiovascular:     Rate and Rhythm: Normal rate and regular rhythm.     Heart sounds: Normal heart sounds. No murmur heard. Pulmonary:     Effort: Pulmonary effort is normal. No respiratory distress.     Breath sounds: Normal breath sounds.   Abdominal:     General: Bowel sounds are normal. There is no distension.     Palpations: Abdomen is soft. There is no mass.     Tenderness: There is no abdominal tenderness. There is no guarding or rebound.  Genitourinary:    Vagina: Normal.  Musculoskeletal:        General: Swelling and tenderness present.     Cervical back: Normal range of motion and neck supple.     Lumbar back: Spasms and tenderness present. Decreased range of motion.     Right lower leg: No edema.     Left lower leg: No edema.  Lymphadenopathy:     Cervical: No cervical adenopathy.  Skin:    General: Skin is warm and dry.     Findings: No erythema or rash.  Neurological:     Mental Status: She is alert and oriented to person, place, and time.     Cranial Nerves: No cranial nerve deficit.     Deep Tendon Reflexes: Reflexes are normal and symmetric.  Psychiatric:        Mood and Affect: Mood normal.        Behavior: Behavior normal.        Thought Content: Thought content normal.        Judgment: Judgment normal.     BP 126/78 (BP Location: Left Arm, Patient Position: Sitting, Cuff Size: Large)   Pulse 83   Temp 98.2 F (36.8 C) (Oral)   Resp 18   Ht 5' 6 (1.676 m)   Wt (!) 300 lb 3.2 oz (136.2 kg)   SpO2 97%   BMI 48.45 kg/m  Wt Readings from Last 3 Encounters:  02/23/24 (!) 300 lb 3.2 oz (136.2 kg)  02/03/24 (!) 312 lb (141.5 kg)  01/15/24 (!) 312 lb 12.8 oz (141.9 kg)    Diabetic Foot Exam - Simple   No data filed    Lab Results  Component Value Date   WBC 2.5 (L) 08/06/2023   HGB 12.3 08/06/2023   HCT 37.2 08/06/2023   PLT 257.0 08/06/2023   GLUCOSE 82 08/06/2023   CHOL 185 08/06/2023   TRIG 57.0 08/06/2023   HDL 70.60 08/06/2023   LDLDIRECT 129.1 10/17/2009   LDLCALC 103 (H) 08/06/2023   ALT 17 08/06/2023   AST 18 08/06/2023   NA 143 08/06/2023   K 4.0 08/06/2023   CL 103 08/06/2023   CREATININE 0.95 08/06/2023   BUN 13 08/06/2023   CO2 29 08/06/2023   TSH 2.13  02/09/2023   INR 1.1 (H) 10/16/2014   HGBA1C 5.7 08/06/2023   MICROALBUR 0.9 08/06/2023    Lab Results  Component Value Date   TSH 2.13 02/09/2023   Lab Results  Component Value Date   WBC 2.5 (L) 08/06/2023   HGB 12.3 08/06/2023   HCT 37.2 08/06/2023   MCV 87.4 08/06/2023   PLT 257.0 08/06/2023   Lab Results  Component Value Date   NA 143 08/06/2023   K 4.0 08/06/2023   CO2 29 08/06/2023   GLUCOSE 82 08/06/2023   BUN 13 08/06/2023   CREATININE 0.95 08/06/2023   BILITOT 0.8 08/06/2023   ALKPHOS 73 08/06/2023  AST 18 08/06/2023   ALT 17 08/06/2023   PROT 6.6 08/06/2023   ALBUMIN 4.0 08/06/2023   CALCIUM  8.8 08/06/2023   ANIONGAP 5 04/22/2018   GFR 64.21 08/06/2023   Lab Results  Component Value Date   CHOL 185 08/06/2023   Lab Results  Component Value Date   HDL 70.60 08/06/2023   Lab Results  Component Value Date   LDLCALC 103 (H) 08/06/2023   Lab Results  Component Value Date   TRIG 57.0 08/06/2023   Lab Results  Component Value Date   CHOLHDL 3 08/06/2023   Lab Results  Component Value Date   HGBA1C 5.7 08/06/2023       Assessment & Plan:  Preventative health care Assessment & Plan: Ghm utd Check labs  See AVS  Health Maintenance  Topic Date Due   OPHTHALMOLOGY EXAM  07/11/2022   FOOT EXAM  05/01/2023   COVID-19 Vaccine (4 - 2025-26 season) 01/04/2024   HEMOGLOBIN A1C  02/05/2024   Colonoscopy  05/11/2024   Diabetic kidney evaluation - eGFR measurement  08/05/2024   Diabetic kidney evaluation - Urine ACR  08/05/2024   Mammogram  10/27/2024   DTaP/Tdap/Td (3 - Td or Tdap) 08/05/2027   Pneumococcal Vaccine: 50+ Years  Completed   Influenza Vaccine  Completed   Hepatitis C Screening  Completed   HIV Screening  Completed   Zoster Vaccines- Shingrix   Completed   Hepatitis B Vaccines 19-59 Average Risk  Aged Out   HPV VACCINES  Aged Out   Meningococcal B Vaccine  Aged Out     Orders: -     CBC with Differential/Platelet -      Comprehensive metabolic panel with GFR -     Lipid panel -     TSH  Need for influenza vaccination -     Flu vaccine trivalent PF, 6mos and older(Flulaval,Afluria,Fluarix,Fluzone)  Type 2 diabetes mellitus with hyperglycemia, without long-term current use of insulin  (HCC) Assessment & Plan: Hgba1c to be checked,  minimize simple carbs. Increase exercise as tolerated. Continue current meds   Orders: -     Hemoglobin A1c -     Insulin , random  Essential hypertension -     CBC with Differential/Platelet -     Comprehensive metabolic panel with GFR  Chronic pain of both knees  Hyperlipidemia, unspecified hyperlipidemia type -     Comprehensive metabolic panel with GFR -     Lipid panel  Degenerative disc disease (DDD) of lumbar region with discogenic back pain and leg pain -     DULoxetine HCl; Take 1 capsule (30 mg total) by mouth daily.  Dispense: 30 capsule; Refill: 2  Morbid obesity (HCC) -     VITAMIN D  25 Hydroxy (Vit-D Deficiency, Fractures) -     Vitamin B12  Bilateral leg pain -     Ambulatory referral to Neurology  Weakness -     Ambulatory referral to Neurology  Seasonal allergies -     Fluticasone  Propionate; Place 1 spray into both nostrils 2 (two) times daily as needed for allergies or rhinitis.  Dispense: 48 g; Refill: 5    [name] Antonio Cyndee, DO

## 2024-02-23 NOTE — Assessment & Plan Note (Signed)
 Hgba1c to be checked , minimize simple carbs. Increase exercise as tolerated. Continue current meds

## 2024-02-23 NOTE — Assessment & Plan Note (Signed)
 Ghm utd Check labs  See AVS  Health Maintenance  Topic Date Due   OPHTHALMOLOGY EXAM  07/11/2022   FOOT EXAM  05/01/2023   COVID-19 Vaccine (4 - 2025-26 season) 01/04/2024   HEMOGLOBIN A1C  02/05/2024   Colonoscopy  05/11/2024   Diabetic kidney evaluation - eGFR measurement  08/05/2024   Diabetic kidney evaluation - Urine ACR  08/05/2024   Mammogram  10/27/2024   DTaP/Tdap/Td (3 - Td or Tdap) 08/05/2027   Pneumococcal Vaccine: 50+ Years  Completed   Influenza Vaccine  Completed   Hepatitis C Screening  Completed   HIV Screening  Completed   Zoster Vaccines- Shingrix   Completed   Hepatitis B Vaccines 19-59 Average Risk  Aged Out   HPV VACCINES  Aged Out   Meningococcal B Vaccine  Aged Out

## 2024-02-26 LAB — INSULIN, RANDOM: Insulin: 9.1 u[IU]/mL

## 2024-02-28 ENCOUNTER — Ambulatory Visit: Payer: Self-pay | Admitting: Family Medicine

## 2024-03-01 MED ORDER — VITAMIN D (ERGOCALCIFEROL) 1.25 MG (50000 UNIT) PO CAPS: 50000.0000 [IU] | ORAL_CAPSULE | ORAL | 0 refills | Status: AC

## 2024-03-02 ENCOUNTER — Ambulatory Visit (INDEPENDENT_AMBULATORY_CARE_PROVIDER_SITE_OTHER)

## 2024-03-02 DIAGNOSIS — E538 Deficiency of other specified B group vitamins: Secondary | ICD-10-CM

## 2024-03-02 MED ORDER — CYANOCOBALAMIN 1000 MCG/ML IJ SOLN
1000.0000 ug | Freq: Once | INTRAMUSCULAR | Status: AC
  Administered 2024-03-02: 1000 ug via INTRAMUSCULAR

## 2024-03-02 NOTE — Progress Notes (Signed)
 Tammy [name] is a 63 y.o. female presents to the office today for B-12  injections, per physician's orders. Original order: Dr.Lowne  she may benefit from b12 weekly x4 then monthly Vita d--- 50,000 weekly x3 months  B-12 (med), 1.000 (dose),  RD (route) was administered  (location) today. Patient tolerated injection. Patient due for follow up labs/provider appt:  Date due: , appt made  Patient next injection due: 03/11/2024, appt made Yes  [name] M Jahid Weida

## 2024-03-10 ENCOUNTER — Ambulatory Visit: Admitting: Diagnostic Neuroimaging

## 2024-03-11 ENCOUNTER — Ambulatory Visit

## 2024-03-11 DIAGNOSIS — E538 Deficiency of other specified B group vitamins: Secondary | ICD-10-CM | POA: Diagnosis not present

## 2024-03-11 MED ORDER — CYANOCOBALAMIN 1000 MCG/ML IJ SOLN
1000.0000 ug | Freq: Once | INTRAMUSCULAR | Status: AC
  Administered 2024-03-11: 1000 ug via INTRAMUSCULAR

## 2024-03-11 NOTE — Progress Notes (Signed)
 Tammy [name] is a 63 y.o. female presents to the office today for B-12 injections, per physician's orders. Original order: Dr.Lowne she may benefit from b12 weekly x4 then monthly Vita d--- 50,000 weekly x3 months ,Next B-12 3/4 weekly B-12 (med), 1.000 (dose), RD (route) was administered (location) today. Patient tolerated injection. Patient due for follow up labs/provider appt: Date due: , appt made  03/17/2024

## 2024-03-17 ENCOUNTER — Ambulatory Visit

## 2024-03-17 DIAGNOSIS — E538 Deficiency of other specified B group vitamins: Secondary | ICD-10-CM

## 2024-03-17 MED ORDER — CYANOCOBALAMIN 1000 MCG/ML IJ SOLN
1000.0000 ug | Freq: Once | INTRAMUSCULAR | Status: AC
  Administered 2024-03-17: 1000 ug via INTRAMUSCULAR

## 2024-03-17 NOTE — Progress Notes (Signed)
 Pt here for weekly B12 injection #3 of 4 per original order dated: 02/28/24  Dr.Lowne  she may benefit from b12 weekly x4 then monthly  Last B12 injection:03/11/24  Last B12 level:  02/23/24 was 203   B12 given IM, and pt tolerated injection well.  Next B12 injection scheduled for:  03/25/24

## 2024-03-25 ENCOUNTER — Encounter: Payer: Self-pay | Admitting: Family Medicine

## 2024-03-25 ENCOUNTER — Ambulatory Visit: Admitting: Family Medicine

## 2024-03-25 VITALS — BP 130/84 | HR 70 | Temp 98.2°F | Resp 16 | Ht 66.0 in | Wt 295.2 lb

## 2024-03-25 DIAGNOSIS — M25552 Pain in left hip: Secondary | ICD-10-CM | POA: Diagnosis not present

## 2024-03-25 DIAGNOSIS — E538 Deficiency of other specified B group vitamins: Secondary | ICD-10-CM

## 2024-03-25 DIAGNOSIS — G8929 Other chronic pain: Secondary | ICD-10-CM | POA: Diagnosis not present

## 2024-03-25 DIAGNOSIS — M25561 Pain in right knee: Secondary | ICD-10-CM

## 2024-03-25 MED ORDER — CYANOCOBALAMIN 1000 MCG/ML IJ SOLN
1000.0000 ug | Freq: Once | INTRAMUSCULAR | Status: AC
  Administered 2024-03-25: 1000 ug via INTRAMUSCULAR

## 2024-03-25 MED ORDER — TRAMADOL HCL 50 MG PO TABS
50.0000 mg | ORAL_TABLET | Freq: Four times a day (QID) | ORAL | 0 refills | Status: AC | PRN
Start: 2024-03-25 — End: ?

## 2024-03-25 NOTE — Progress Notes (Signed)
 Subjective:    Patient ID: Tammy [name], female    [name]: Jul 29, 1960, 63 y.o.   MRN: [id number]  Chief Complaint  Patient presents with   Pain Management    Pt states some improvement. Pt states only having pain in the right knee    HPI Patient is in today for f/u pain.  Discussed the use of AI scribe software for clinical note transcription with the patient, who gave verbal consent to proceed.  History of Present Illness Tammy [name] is a 63 year old female with knee arthritis who presents for follow-up of right knee pain.  She experiences persistent pain in her right knee, described as having a 'jiggle' and a burning sensation with certain movements. The knee feels as if it needs to be 'snapped back' into place. 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  She recounts a recent experience at a church event where a guest speaker identified her by name and described symptoms she had been experiencing, including pain on the left side of her body, which she had feared might be a stroke. Following this event, her symptoms on the left side have resolved completely.  She is currently taking Cymbalta  for pain management and has previously been prescribed tramadol  for pain in September. She is unsure if she needs a refill for tramadol , especially considering her upcoming activities.  She has been receiving B12 injections due to low levels, with her last weekly injection scheduled for today, after which she will transition to monthly injections. She has also been using sublingual B12 supplements.  She is actively involved in her church community and volunteers at a school. No recent sore throat or significant cold symptoms, attributing hoarseness to sinus drainage. No weakness in arms or hands, only on the left side previously.    Past Medical History:  Diagnosis Date   Acute on chronic appendicitis s/p lap appy 06/10/2012 06/10/2012   surgery done 06-10-12    Angioedema of intestine due to angiotensin converting enzyme inhibitor (ACE-I)    Arthritis    ankle   Back pain    Chest pain    Complication of anesthesia    Constipation    Diabetes mellitus    TYPE 2- no meds in several months-was taken off meds -monitoring blood sugar.   GERD (gastroesophageal reflux disease)    History of colon polyps    Hyperlipidemia    Hypertension    Joint pain    Lactose intolerance    Morbid obesity - BMI > 50 12/13/2012   Pneumonia    none recent   PONV (postoperative nausea and vomiting)    Poor venous access    usually difficult stick   Prediabetes    Sinusitis    SOB (shortness of breath)    Swelling    Vitamin D  deficiency     Past Surgical History:  Procedure Laterality Date   ABDOMINAL HYSTERECTOMY     APPENDECTOMY     2'14   COLONOSCOPY  04/11/11   5 mm polyp removed but not recovered   COLONOSCOPY WITH PROPOFOL  N/A 05/11/2017   Procedure: COLONOSCOPY WITH PROPOFOL ;  Surgeon: Avram Lupita BRAVO, MD;  Location: [facility];  Service: Endoscopy;  Laterality: N/A;   GASTRIC ROUX-EN-Y N/A 02/27/2015   Procedure: LAPAROSCOPIC ROUX-EN-Y GASTRIC BYPASS WITH UPPER ENDOSCOPY;  Surgeon: Donnice Lunger, MD;  Location: [facility];  Service: General;  Laterality: N/A;   KNEE SURGERY Right  KNEE SURGERY    LAPAROSCOPIC APPENDECTOMY  06/10/2012   Procedure: APPENDECTOMY LAPAROSCOPIC;  Surgeon: Elspeth KYM Schultze, MD;  Location: [facility];  Service: General;  Laterality: N/A;   UPPER GASTROINTESTINAL ENDOSCOPY  04/14/2006   Normal    Family History  Problem Relation Age of Onset   Hyperlipidemia Mother    Hypertension Mother    Hodgkin's lymphoma Mother 56   Diabetes Father    Heart failure Father    Heart disease Father    Breast cancer Maternal Aunt    Breast cancer Maternal Aunt    Diabetes Paternal Grandmother    Heart disease Cousin    Hyperlipidemia Brother    Colon cancer Neg Hx    Heart attack Neg Hx    Sudden death Neg Hx    Colon  polyps Neg Hx    Esophageal cancer Neg Hx    Stomach cancer Neg Hx    Rectal cancer Neg Hx     Social History   Socioeconomic History   Marital status: Married    Spouse name: Engineer, Civil (consulting)   Number of children: 0   Years of education: Not on file   Highest education level: Some college, no degree  Occupational History   Occupation: FAB Set Designer: [employer]  Tobacco Use   Smoking status: Never   Smokeless tobacco: Never  Vaping Use   Vaping status: Never Used  Substance and Sexual Activity   Alcohol use: No    Alcohol/week: 0.0 standard drinks of alcohol   Drug use: No   Sexual activity: Not on file  Other Topics Concern   Not on file  Social History Narrative   Married, works at [employer]   No children   No alcohol tobacco or drug use   Social Drivers of Corporate Investment Banker Strain: Low Risk  (11/03/2023)   Overall Financial Resource Strain (CARDIA)    Difficulty of Paying Living Expenses: Not hard at all  Food Insecurity: No Food Insecurity (11/03/2023)   Hunger Vital Sign    Worried About Running Out of Food in the Last Year: Never true    Ran Out of Food in the Last Year: Never true  Transportation Needs: No Transportation Needs (11/03/2023)   PRAPARE - Administrator, Civil Service (Medical): No    Lack of Transportation (Non-Medical): No  Physical Activity: Insufficiently Active (11/03/2023)   Exercise Vital Sign    Days of Exercise per Week: 2 days    Minutes of Exercise per Session: 30 min  Stress: No Stress Concern Present (11/03/2023)   [employer] of Occupational Health - Occupational Stress Questionnaire    Feeling of Stress: Only a little  Social Connections: Socially Integrated (11/03/2023)   Social Connection and Isolation Panel    Frequency of Communication with Friends and Family: Three times a week    Frequency of Social Gatherings with Friends and Family: Once a week    Attends Religious Services: More than 4  times per year    Active Member of [employer] or Organizations: Yes    Attends Banker Meetings: More than 4 times per year    Marital Status: Married  Catering Manager Violence: Unknown (12/10/2021)   Received from [facility]   HITS    Physically Hurt: Not on file    Insult or Talk Down To: Not on file    Threaten Physical Harm: Not on file    Scream or  Curse: Not on file    Outpatient Medications Prior to Visit  Medication Sig Dispense Refill   amLODipine  (NORVASC ) 2.5 MG tablet Take 1 tablet (2.5 mg total) by mouth 2 (two) times daily. 180 tablet 3   carvedilol  (COREG ) 12.5 MG tablet Take 1 tablet (12.5 mg total) by mouth 2 (two) times daily with a meal. 180 tablet 3   diazepam  (VALIUM ) 5 MG tablet Take 2 tablets 30 minutes prior to procedure. 2 tablet 0   DULoxetine  (CYMBALTA ) 30 MG capsule Take 1 capsule (30 mg total) by mouth daily. 30 capsule 2   fluticasone  (FLONASE ) 50 MCG/ACT nasal spray Place 1 spray into both nostrils 2 (two) times daily as needed for allergies or rhinitis. 48 g 5   furosemide  (LASIX ) 40 MG tablet TAKE 1 TABLET BY MOUTH DAILY 90 tablet 1   glucose blood test strip Check blood sugar twice daily 100 each 11   Multiple Vitamins-Minerals (MULTIVITAMIN ADULT PO) Take 1 tablet by mouth daily. flintstone vitamins     RABEprazole  (ACIPHEX ) 20 MG tablet Take 1 tablet (20 mg total) by mouth daily. 90 tablet 3   tirzepatide  (MOUNJARO ) 12.5 MG/0.5ML Pen Inject 12.5 mg into the skin once a week. 6 mL 3   Vitamin D , Ergocalciferol , (DRISDOL ) 1.25 MG (50000 UNIT) CAPS capsule Take 1 capsule (50,000 Units total) by mouth every 7 (seven) days. For 3 months. 12 capsule 0   traMADol  (ULTRAM ) 50 MG tablet Take 1 tablet (50 mg total) by mouth every 8 (eight) hours as needed.     No facility-administered medications prior to visit.    Allergies  Allergen Reactions   Ace Inhibitors Anaphylaxis    Bowel angioedema   Cheese Nausea And Vomiting   Red Dye #40 (Allura  Red) Nausea And Vomiting   Tuna [Fish Allergy ] Nausea And Vomiting    Review of Systems  Constitutional:  Negative for chills, fever and malaise/fatigue.  HENT:  Negative for congestion and hearing loss.   Eyes:  Negative for blurred vision and discharge.  Respiratory:  Negative for cough, sputum production and shortness of breath.   Cardiovascular:  Negative for chest pain, palpitations and leg swelling.  Gastrointestinal:  Negative for abdominal pain, blood in stool, constipation, diarrhea, heartburn, nausea and vomiting.  Genitourinary:  Negative for dysuria, frequency, hematuria and urgency.  Musculoskeletal:  Negative for back pain, falls and myalgias.  Skin:  Negative for rash.  Neurological:  Negative for dizziness, sensory change, loss of consciousness, weakness and headaches.  Endo/Heme/Allergies:  Negative for environmental allergies. Does not bruise/bleed easily.  Psychiatric/Behavioral:  Negative for depression and suicidal ideas. The patient is not nervous/anxious and does not have insomnia.        Objective:    Physical Exam Vitals and nursing note reviewed.  Constitutional:      General: She is not in acute distress.    Appearance: Normal appearance. She is well-developed.  HENT:     Head: Normocephalic and atraumatic.  Eyes:     General: No scleral icterus.       Right eye: No discharge.        Left eye: No discharge.  Cardiovascular:     Rate and Rhythm: Normal rate and regular rhythm.     Heart sounds: No murmur heard. Pulmonary:     Effort: Pulmonary effort is normal. No respiratory distress.     Breath sounds: Normal breath sounds.  Musculoskeletal:        General: Normal range of  motion.     Cervical back: Normal range of motion and neck supple.     Right lower leg: No edema.     Left lower leg: No edema.  Skin:    General: Skin is warm and dry.  Neurological:     Mental Status: She is alert and oriented to person, place, and time.  Psychiatric:         Mood and Affect: Mood normal.        Behavior: Behavior normal.        Thought Content: Thought content normal.        Judgment: Judgment normal.     BP 130/84 (BP Location: Left Arm, Patient Position: Sitting, Cuff Size: Large)   Pulse 70   Temp 98.2 F (36.8 C) (Oral)   Resp 16   Ht 5' 6 (1.676 m)   Wt 295 lb 3.2 oz (133.9 kg)   SpO2 96%   BMI 47.65 kg/m  Wt Readings from Last 3 Encounters:  03/25/24 295 lb 3.2 oz (133.9 kg)  02/23/24 (!) 300 lb 3.2 oz (136.2 kg)  02/03/24 (!) 312 lb (141.5 kg)    Diabetic Foot Exam - Simple   No data filed    Lab Results  Component Value Date   WBC 2.4 Repeated and verified X2. (L) 02/23/2024   HGB 12.9 02/23/2024   HCT 40.0 02/23/2024   PLT 270.0 02/23/2024   GLUCOSE 93 02/23/2024   CHOL 186 02/23/2024   TRIG 55.0 02/23/2024   HDL 69.40 02/23/2024   LDLDIRECT 129.1 10/17/2009   LDLCALC 106 (H) 02/23/2024   ALT 16 02/23/2024   AST 20 02/23/2024   NA 142 02/23/2024   K 3.9 02/23/2024   CL 104 02/23/2024   CREATININE 0.88 02/23/2024   BUN 11 02/23/2024   CO2 29 02/23/2024   TSH 2.27 02/23/2024   INR 1.1 (H) 10/16/2014   HGBA1C 5.5 02/23/2024   MICROALBUR 0.9 08/06/2023    Lab Results  Component Value Date   TSH 2.27 02/23/2024   Lab Results  Component Value Date   WBC 2.4 Repeated and verified X2. (L) 02/23/2024   HGB 12.9 02/23/2024   HCT 40.0 02/23/2024   MCV 86.7 02/23/2024   PLT 270.0 02/23/2024   Lab Results  Component Value Date   NA 142 02/23/2024   K 3.9 02/23/2024   CO2 29 02/23/2024   GLUCOSE 93 02/23/2024   BUN 11 02/23/2024   CREATININE 0.88 02/23/2024   BILITOT 0.7 02/23/2024   ALKPHOS 80 02/23/2024   AST 20 02/23/2024   ALT 16 02/23/2024   PROT 7.0 02/23/2024   ALBUMIN 4.3 02/23/2024   CALCIUM  8.7 02/23/2024   ANIONGAP 5 04/22/2018   GFR 70.12 02/23/2024   Lab Results  Component Value Date   CHOL 186 02/23/2024   Lab Results  Component Value Date   HDL 69.40 02/23/2024    Lab Results  Component Value Date   LDLCALC 106 (H) 02/23/2024   Lab Results  Component Value Date   TRIG 55.0 02/23/2024   Lab Results  Component Value Date   CHOLHDL 3 02/23/2024   Lab Results  Component Value Date   HGBA1C 5.5 02/23/2024     Assessment and Plan Assessment & Plan Painful right knee osteoarthritis   Chronic right knee pain is likely due to osteoarthritis, with symptoms of burning sensation and instability, possibly from bone-on-bone contact. She is awaiting evaluation by Dr. Cleatrice for potential injection therapy and is considering  spiritual intervention for pain relief. Continue follow-up with Dr. Cleatrice for knee evaluation and potential injection therapy. Tramadol  is prescribed for pain management, especially for upcoming activities. Strengthening exercises for legs and core are encouraged to improve stability and reduce knee pain.  Vitamin B12 deficiency   Her Vitamin B12 level is slightly below normal, contributing to weakness and fatigue. She is currently on weekly B12 injections and will transition to monthly injections. The last weekly B12 injection was administered today. Sublingual B12 supplementation was discussed as an alternative to injections.  Assessment and Plan Assessment & Plan Painful right knee osteoarthritis   Chronic right knee pain is likely due to osteoarthritis, with symptoms of burning sensation and instability, possibly from bone-on-bone contact. She is awaiting evaluation by Dr. Cleatrice for potential injection therapy and is considering spiritual intervention for pain relief. Continue follow-up with Dr. Cleatrice for knee evaluation and potential injection therapy. Tramadol  is prescribed for pain management, especially for upcoming activities. Strengthening exercises for legs and core are encouraged to improve stability and reduce knee pain.  Vitamin B12 deficiency   Her Vitamin B12 level is slightly below normal, contributing to  weakness and fatigue. She is currently on weekly B12 injections and will transition to monthly injections. The last weekly B12 injection was administered today. Sublingual B12 supplementation was discussed as an alternative to injections.    Susy Placzek R Lowne Chase, DO

## 2024-03-28 ENCOUNTER — Ambulatory Visit (INDEPENDENT_AMBULATORY_CARE_PROVIDER_SITE_OTHER): Admitting: Family Medicine

## 2024-03-28 VITALS — BP 138/60 | Ht 65.0 in | Wt 295.0 lb

## 2024-03-28 DIAGNOSIS — M1711 Unilateral primary osteoarthritis, right knee: Secondary | ICD-10-CM

## 2024-03-28 NOTE — Patient Instructions (Signed)
  VISIT SUMMARY: Today, you were seen for right knee pain and instability. You described a burning pain in the front of your right knee and a 'jiggly' sensation when walking, which has caused you distress in public settings. You have a history of arthritis in both knees and have previously tried various treatments, including cortisone injections, and gel injections. We discussed your current pain management regimen and the need for further imaging to better understand your condition.  YOUR PLAN: -RIGHT KNEE PAIN AND INSTABILITY: Osteoarthritis is a condition where the cartilage in your joints wears down over time, causing pain and instability. We will start by getting x-rays of your right knee to see how severe the arthritis is. Depending on the results, we may also consider an MRI to check for any ligament or meniscus damage. You should continue taking tramadol  for pain management, and we discussed the possibility of cortisone shots to help with inflammation and pain.  INSTRUCTIONS: Please get the x-rays of your right knee as soon as possible. Depending on the results, we may schedule an MRI. Continue taking tramadol  as prescribed for pain management. If the pain persists or worsens, we may consider cortisone shots. Follow up with us  after your imaging results are available to discuss the next steps.

## 2024-03-29 NOTE — Progress Notes (Signed)
 PCP: [name], DO  Discussed the use of AI scribe software for clinical note transcription with the patient, who gave verbal consent to proceed.  History of Present Illness Tammy [name] is a 63 year old female with arthritis who presents with right knee pain and instability.  Right knee pain and instability - Burning pain localized to the anterior right knee - Instability during ambulation, described as a 'jiggly' sensation - Pain and instability occur even without misstepping - Episodes of distress in public settings, including crying out in pain at a grocery store - Pain necessitates adjustment of gait  Osteoarthritis of the knees - History of arthritis affecting both knees - Previous treatments include x-rays, cortisone injections, and gel injections - No recent imaging of the right knee  Analgesic use and pain management - Treated with tramadol  and Cymbalta  for pain control  Past Medical History:  Diagnosis Date   Acute on chronic appendicitis s/p lap appy 06/10/2012 06/10/2012   surgery done 06-10-12   Angioedema of intestine due to angiotensin converting enzyme inhibitor (ACE-I)    Arthritis    ankle   Back pain    Chest pain    Complication of anesthesia    Constipation    Diabetes mellitus    TYPE 2- no meds in several months-was taken off meds -monitoring blood sugar.   GERD (gastroesophageal reflux disease)    History of colon polyps    Hyperlipidemia    Hypertension    Joint pain    Lactose intolerance    Morbid obesity - BMI > 50 12/13/2012   Pneumonia    none recent   PONV (postoperative nausea and vomiting)    Poor venous access    usually difficult stick   Prediabetes    Sinusitis    SOB (shortness of breath)    Swelling    Vitamin D  deficiency     Current Outpatient Medications on File Prior to Visit  Medication Sig Dispense Refill   amLODipine  (NORVASC ) 2.5 MG tablet Take 1 tablet (2.5 mg total) by mouth 2 (two) times daily. 180  tablet 3   carvedilol  (COREG ) 12.5 MG tablet Take 1 tablet (12.5 mg total) by mouth 2 (two) times daily with a meal. 180 tablet 3   diazepam  (VALIUM ) 5 MG tablet Take 2 tablets 30 minutes prior to procedure. 2 tablet 0   DULoxetine  (CYMBALTA ) 30 MG capsule Take 1 capsule (30 mg total) by mouth daily. 30 capsule 2   fluticasone  (FLONASE ) 50 MCG/ACT nasal spray Place 1 spray into both nostrils 2 (two) times daily as needed for allergies or rhinitis. 48 g 5   furosemide  (LASIX ) 40 MG tablet TAKE 1 TABLET BY MOUTH DAILY 90 tablet 1   glucose blood test strip Check blood sugar twice daily 100 each 11   Multiple Vitamins-Minerals (MULTIVITAMIN ADULT PO) Take 1 tablet by mouth daily. flintstone vitamins     RABEprazole  (ACIPHEX ) 20 MG tablet Take 1 tablet (20 mg total) by mouth daily. 90 tablet 3   tirzepatide  (MOUNJARO ) 12.5 MG/0.5ML Pen Inject 12.5 mg into the skin once a week. 6 mL 3   traMADol  (ULTRAM ) 50 MG tablet Take 1 tablet (50 mg total) by mouth every 6 (six) hours as needed. 30 tablet 0   Vitamin D , Ergocalciferol , (DRISDOL ) 1.25 MG (50000 UNIT) CAPS capsule Take 1 capsule (50,000 Units total) by mouth every 7 (seven) days. For 3 months. 12 capsule 0   No current facility-administered medications on  file prior to visit.    Past Surgical History:  Procedure Laterality Date   ABDOMINAL HYSTERECTOMY     APPENDECTOMY     2'14   COLONOSCOPY  04/11/11   5 mm polyp removed but not recovered   COLONOSCOPY WITH PROPOFOL  N/A 05/11/2017   Procedure: COLONOSCOPY WITH PROPOFOL ;  Surgeon: Avram Lupita BRAVO, MD;  Location: [facility];  Service: Endoscopy;  Laterality: N/A;   GASTRIC ROUX-EN-Y N/A 02/27/2015   Procedure: LAPAROSCOPIC ROUX-EN-Y GASTRIC BYPASS WITH UPPER ENDOSCOPY;  Surgeon: Donnice Lunger, MD;  Location: [facility];  Service: General;  Laterality: N/A;   KNEE SURGERY Right     KNEE SURGERY    LAPAROSCOPIC APPENDECTOMY  06/10/2012   Procedure: APPENDECTOMY LAPAROSCOPIC;  Surgeon: Elspeth KYM Schultze, MD;  Location: [facility];  Service: General;  Laterality: N/A;   UPPER GASTROINTESTINAL ENDOSCOPY  04/14/2006   Normal    Allergies  Allergen Reactions   Ace Inhibitors Anaphylaxis    Bowel angioedema   Cheese Nausea And Vomiting   Red Dye #40 (Allura Red) Nausea And Vomiting   Tuna [Fish Allergy ] Nausea And Vomiting    BP 138/60   Ht 5' 5 (1.651 m)   Wt 295 lb (133.8 kg)   BMI 49.09 kg/m       No data to display              No data to display              Objective:  Physical Exam:  Gen: NAD, comfortable in exam room  Right knee: Notable soft tissue swelling.  Effusion difficult to discern.  No ecchymoses. TTP medial > lateral joint line.  No post patellar tenderness.  Calf with mild tenderness but no rigidity or masses. FROM with normal strength. Negative ant/post drawers. Negative valgus/varus testing. Negative lachman. Pain with mcmurrays, apleys.  NV intact distally. Assessment & Plan Right knee pain and instability Pain with burning pain and instability, exacerbated by walking. Differential includes arthritis, ligament tear, and meniscus problems. Previous treatments provided limited relief. Imaging needed to assess arthritis and potential ligament or meniscus damage. - Ordered x-rays of the right knee to assess arthritis severity. - Consider MRI if x-rays do not indicate moderate to severe arthritis. - Continue tramadol  for pain management. - Discussed potential for cortisone shots for inflammation and pain.  Bilateral leg swelling Contributing to nerve tension and exacerbating knee pain.

## 2024-04-04 ENCOUNTER — Ambulatory Visit (HOSPITAL_BASED_OUTPATIENT_CLINIC_OR_DEPARTMENT_OTHER)
Admission: RE | Admit: 2024-04-04 | Discharge: 2024-04-04 | Disposition: A | Source: Ambulatory Visit | Attending: Family Medicine | Admitting: Family Medicine

## 2024-04-04 DIAGNOSIS — M1711 Unilateral primary osteoarthritis, right knee: Secondary | ICD-10-CM | POA: Diagnosis present

## 2024-04-14 ENCOUNTER — Ambulatory Visit: Payer: Self-pay | Admitting: Family Medicine

## 2024-04-18 ENCOUNTER — Ambulatory Visit: Admitting: Family Medicine

## 2024-04-18 ENCOUNTER — Other Ambulatory Visit: Payer: Self-pay

## 2024-04-18 VITALS — BP 138/68 | Ht 65.0 in | Wt 290.0 lb

## 2024-04-18 DIAGNOSIS — M1711 Unilateral primary osteoarthritis, right knee: Secondary | ICD-10-CM

## 2024-04-18 MED ORDER — METHYLPREDNISOLONE ACETATE 40 MG/ML IJ SUSP
40.0000 mg | Freq: Once | INTRAMUSCULAR | Status: AC
  Administered 2024-04-18: 12:00:00 40 mg via INTRA_ARTICULAR

## 2024-04-18 NOTE — Progress Notes (Unsigned)
 PCP: [name], DO  Patient is a 63 y.o. female here for right knee pain.  Patient was last seen on 11/24 for knee osteoarthritis, radiography ordered.  Patient reports a continuing burning sensation of anterior and inferior knee for about 1 month. She has chronic generalized knee pain which persists. Patient still feels unstable and missteps occasionally still. Has to lift leg in carefully to get into car. Patient has previously had multiple steroid and gel injections. Gel seemed to help the most. Patient continues tramadol  50 mg twice a month. She takes acetaminophen  roughly twice per day on bad days.  Radiography R knee 12/1 showed moderate patellofemoral joint space narrowing with superior and lateral patellar osteophytes as well as moderate medial compartment narrowing with osteophytes.  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   GERD (gastroesophageal reflux disease)    History of colon polyps    Hyperlipidemia    Hypertension    Joint pain    Lactose intolerance    Morbid obesity - BMI > 50 12/13/2012   Pneumonia    none recent   PONV (postoperative nausea and vomiting)    Poor venous access    usually difficult stick   Prediabetes    Sinusitis    SOB (shortness of breath)    Swelling    Vitamin D  deficiency     Medications Ordered Prior to Encounter[1]  Past Surgical History:  Procedure Laterality Date   ABDOMINAL HYSTERECTOMY     APPENDECTOMY     2'14   COLONOSCOPY  04/11/11   5 mm polyp removed but not recovered   COLONOSCOPY WITH PROPOFOL  N/A 05/11/2017   Procedure: COLONOSCOPY WITH  PROPOFOL ;  Surgeon: Avram Lupita BRAVO, MD;  Location: [facility];  Service: Endoscopy;  Laterality: N/A;   GASTRIC ROUX-EN-Y N/A 02/27/2015   Procedure: LAPAROSCOPIC ROUX-EN-Y GASTRIC BYPASS WITH UPPER ENDOSCOPY;  Surgeon: Donnice Lunger, MD;  Location: [facility];  Service: General;  Laterality: N/A;   KNEE SURGERY Right     KNEE SURGERY    LAPAROSCOPIC APPENDECTOMY  06/10/2012   Procedure: APPENDECTOMY LAPAROSCOPIC;  Surgeon: Elspeth KYM Schultze, MD;  Location: [facility];  Service: General;  Laterality: N/A;   UPPER GASTROINTESTINAL ENDOSCOPY  04/14/2006   Normal    Allergies[2]  BP 138/68   Ht 5' 5 (1.651 m)   Wt 290 lb (131.5 kg)   BMI 48.26 kg/m       No data to display              No data to display              Objective:  Physical Exam:  Gen: NAD, comfortable in exam room  Location: Right knee - Inspection: Limited by body habitus, no evident edema, no overlying skin changes - Palpation: TTP along medial>lateral joint lines - ROM: Full passive ROM without limitation - Strength: 5/5 knee extension, 5/5 all other muscle groups - Special Tests: Pain with McMurray's.  Negative SLR, negative anterior and posterior drawer testing, negative Lachman, no laxity with valgus/varus  stress - Neurovascular: 2+ DP pulse, sensation intact to light touch  POCUS Small effusion***   Assessment and Plan:   Primary osteoarthritis of R knee Persistent chronic osteoarthritis pain.  Paint desires gel injection given past benefit, amenable to steroid injection today. - Knee steroid injection as below - PA initiated for hyaluronic acid injection  Right knee injection After informed written consent timeout was performed, patient was laid down on exam table. Right knee was prepped with alcohol swab and utilizing superolateral approach and ultrasound guidance, patient's right knee was injected intraarticularly with 3:1 lidocaine : depomedrol. Patient tolerated the procedure well without  immediate complications.     [1]  Current Outpatient Medications on File Prior to Visit  Medication Sig Dispense Refill   amLODipine  (NORVASC ) 2.5 MG tablet Take 1 tablet (2.5 mg total) by mouth 2 (two) times daily. 180 tablet 3   carvedilol  (COREG ) 12.5 MG tablet Take 1 tablet (12.5 mg total) by mouth 2 (two) times daily with a meal. 180 tablet 3   diazepam  (VALIUM ) 5 MG tablet Take 2 tablets 30 minutes prior to procedure. 2 tablet 0   DULoxetine  (CYMBALTA ) 30 MG capsule Take 1 capsule (30 mg total) by mouth daily. 30 capsule 2   fluticasone  (FLONASE ) 50 MCG/ACT nasal spray Place 1 spray into both nostrils 2 (two) times daily as needed for allergies or rhinitis. 48 g 5   furosemide  (LASIX ) 40 MG tablet TAKE 1 TABLET BY MOUTH DAILY 90 tablet 1   glucose blood test strip Check blood sugar twice daily 100 each 11   Multiple Vitamins-Minerals (MULTIVITAMIN ADULT PO) Take 1 tablet by mouth daily. flintstone vitamins     RABEprazole  (ACIPHEX ) 20 MG tablet Take 1 tablet (20 mg total) by mouth daily. 90 tablet 3   tirzepatide  (MOUNJARO ) 12.5 MG/0.5ML Pen Inject 12.5 mg into the skin once a week. 6 mL 3   traMADol  (ULTRAM ) 50 MG tablet Take 1 tablet (50 mg total) by mouth every 6 (six) hours as needed. 30 tablet 0   Vitamin D , Ergocalciferol , (DRISDOL ) 1.25 MG (50000 UNIT) CAPS capsule Take 1 capsule (50,000 Units total) by mouth every 7 (seven) days. For 3 months. 12 capsule 0   No current facility-administered medications on file prior to visit.  [2]  Allergies Allergen Reactions   Ace Inhibitors Anaphylaxis    Bowel angioedema   Cheese Nausea And Vomiting   Red Dye #40 (Allura Red) Nausea And Vomiting   Tuna [Fish Allergy ] Nausea And Vomiting

## 2024-04-22 NOTE — Progress Notes (Signed)
 Called pt to discuss visco benefits - uploaded to media tab in her chart.  She has meet her deductible so coverage is at 80%. She is $312.68 from meeting OOP max. Once that is met coverage goes to 100%.  Discussed with pt that when the new year starts her coverage will change so if she is wanting to get the shot done it would be this calendar year. She is going to think about it and call us  back.  If she wants to proceed - we will order 2 durolane injections for B/L knees.

## 2024-04-25 NOTE — Progress Notes (Signed)
 Pt called back - she would like to proceed with the same injections she had last time which were gelsyn-3. She understands that on 05/05/24 her benefits will reset so her OOP responsibility will be greater.  Medication ordered for B/L knees and pt will be contacted to schedule an appt when we receive it.

## 2024-04-26 ENCOUNTER — Other Ambulatory Visit: Payer: Self-pay

## 2024-04-26 ENCOUNTER — Ambulatory Visit (INDEPENDENT_AMBULATORY_CARE_PROVIDER_SITE_OTHER): Admitting: Family Medicine

## 2024-04-26 VITALS — BP 138/66 | Ht 65.0 in | Wt 290.0 lb

## 2024-04-26 DIAGNOSIS — M170 Bilateral primary osteoarthritis of knee: Secondary | ICD-10-CM

## 2024-04-26 MED ORDER — SODIUM HYALURONATE (VISCOSUP) 16.8 MG/2ML IX SOSY
16.8000 mg | PREFILLED_SYRINGE | Freq: Once | INTRA_ARTICULAR | Status: AC
  Administered 2024-04-26: 16.8 mg via INTRA_ARTICULAR

## 2024-04-26 NOTE — Progress Notes (Signed)
 PCP: Antonio Cyndee Tammy JONELLE, DO  Patient is a 63 y.o. female here for bilateral knee arthritis.  HPI Patient returns for bilateral visco injections today.  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   GERD (gastroesophageal reflux disease)    History of colon polyps    Hyperlipidemia    Hypertension    Joint pain    Lactose intolerance    Morbid obesity - BMI > 50 12/13/2012   Pneumonia    none recent   PONV (postoperative nausea and vomiting)    Poor venous access    usually difficult stick   Prediabetes    Sinusitis    SOB (shortness of breath)    Swelling    Vitamin D  deficiency     Medications Ordered Prior to Encounter[1]  Past Surgical History:  Procedure Laterality Date   ABDOMINAL HYSTERECTOMY     APPENDECTOMY     2'14   COLONOSCOPY  04/11/11   5 mm polyp removed but not recovered   COLONOSCOPY WITH PROPOFOL  N/A 05/11/2017   Procedure: COLONOSCOPY WITH PROPOFOL ;  Surgeon: Avram Lupita BRAVO, MD;  Location: [facility];  Service: Endoscopy;  Laterality: N/A;   GASTRIC ROUX-EN-Y N/A 02/27/2015   Procedure: LAPAROSCOPIC ROUX-EN-Y GASTRIC BYPASS WITH UPPER ENDOSCOPY;  Surgeon: Donnice Lunger, MD;  Location: [facility];  Service: General;  Laterality: N/A;   KNEE SURGERY Right     KNEE SURGERY    LAPAROSCOPIC APPENDECTOMY  06/10/2012   Procedure: APPENDECTOMY LAPAROSCOPIC;  Surgeon: Elspeth KYM Schultze, MD;  Location: [facility];  Service: General;  Laterality: N/A;   UPPER GASTROINTESTINAL ENDOSCOPY  04/14/2006   Normal    Allergies[2]  BP 138/66   Ht 5' 5 (1.651 m)   Wt 290 lb (131.5 kg)   BMI 48.26 kg/m        No data to display              No data to display              Objective:  Physical Exam:  Gen: NAD, comfortable in exam room  Assessment and Plan:  Bilateral knee osteoarthritis - bilateral gelsyn-3 injections given as below.  Follow up in 1 week for second set of injections.  After informed written consent timeout was performed, patient was lying supine on exam table. Right knee was prepped with alcohol swab and utilizing superolateral approach with ultrasound guidance, patient's right knee was injected intraarticularly with 3mL lidocaine  followed by gelsyn-3. Patient tolerated the procedure well without immediate complications.  After informed written consent timeout was performed, patient was lying supine on exam table. Left knee was prepped with alcohol swab and utilizing superolateral approach with ultrasound guidance, patient's left knee was injected intraarticularly with 3mL lidocaine  followed by gelsyn-3. Patient tolerated the procedure well without immediate complications.     [1]  Current Outpatient Medications on File Prior to Visit  Medication Sig Dispense Refill   amLODipine  (NORVASC ) 2.5 MG tablet Take 1 tablet (2.5 mg total) by mouth 2 (two) times daily. 180 tablet 3  carvedilol  (COREG ) 12.5 MG tablet Take 1 tablet (12.5 mg total) by mouth 2 (two) times daily with a meal. 180 tablet 3   diazepam  (VALIUM ) 5 MG tablet Take 2 tablets 30 minutes prior to procedure. 2 tablet 0   DULoxetine  (CYMBALTA ) 30 MG capsule Take 1 capsule (30 mg total) by mouth daily. 30 capsule 2   fluticasone  (FLONASE ) 50 MCG/ACT nasal spray Place 1 spray into both nostrils 2 (two) times daily as needed for allergies or rhinitis. 48 g 5   furosemide  (LASIX ) 40 MG tablet TAKE 1 TABLET BY MOUTH DAILY 90 tablet 1   glucose blood test strip Check blood sugar twice daily 100 each 11   Multiple Vitamins-Minerals (MULTIVITAMIN ADULT PO) Take 1 tablet by mouth daily. flintstone vitamins      RABEprazole  (ACIPHEX ) 20 MG tablet Take 1 tablet (20 mg total) by mouth daily. 90 tablet 3   tirzepatide  (MOUNJARO ) 12.5 MG/0.5ML Pen Inject 12.5 mg into the skin once a week. 6 mL 3   traMADol  (ULTRAM ) 50 MG tablet Take 1 tablet (50 mg total) by mouth every 6 (six) hours as needed. 30 tablet 0   Vitamin D , Ergocalciferol , (DRISDOL ) 1.25 MG (50000 UNIT) CAPS capsule Take 1 capsule (50,000 Units total) by mouth every 7 (seven) days. For 3 months. 12 capsule 0   No current facility-administered medications on file prior to visit.  [2]  Allergies Allergen Reactions   Ace Inhibitors Anaphylaxis    Bowel angioedema   Cheese Nausea And Vomiting   Red Dye #40 (Allura Red) Nausea And Vomiting   Tuna [Fish Allergy ] Nausea And Vomiting

## 2024-05-02 ENCOUNTER — Other Ambulatory Visit: Payer: Self-pay

## 2024-05-02 ENCOUNTER — Ambulatory Visit (INDEPENDENT_AMBULATORY_CARE_PROVIDER_SITE_OTHER): Admitting: Family Medicine

## 2024-05-02 VITALS — BP 138/75 | Ht 65.0 in | Wt 290.0 lb

## 2024-05-02 DIAGNOSIS — M170 Bilateral primary osteoarthritis of knee: Secondary | ICD-10-CM | POA: Diagnosis not present

## 2024-05-02 MED ORDER — SODIUM HYALURONATE (VISCOSUP) 16.8 MG/2ML IX SOSY
16.8000 mg | PREFILLED_SYRINGE | Freq: Once | INTRA_ARTICULAR | Status: AC
  Administered 2024-05-02: 16.8 mg via INTRA_ARTICULAR

## 2024-05-02 NOTE — Progress Notes (Signed)
 PCP: Tammy Cyndee [name], DO  Patient is a 63 y.o. female here for bilateral knee visco injections.  HPI Patient here for second gelsyn-3 injections. Overall doing well - had a lot of stiffness after injections especially with prolonged sitting in vehicle.  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   GERD (gastroesophageal reflux disease)    History of colon polyps    Hyperlipidemia    Hypertension    Joint pain    Lactose intolerance    Morbid obesity - BMI > 50 12/13/2012   Pneumonia    none recent   PONV (postoperative nausea and vomiting)    Poor venous access    usually difficult stick   Prediabetes    Sinusitis    SOB (shortness of breath)    Swelling    Vitamin D  deficiency     Medications Ordered Prior to Encounter[1]  Past Surgical History:  Procedure Laterality Date   ABDOMINAL HYSTERECTOMY     APPENDECTOMY     2'14   COLONOSCOPY  04/11/11   5 mm polyp removed but not recovered   COLONOSCOPY WITH PROPOFOL  N/A 05/11/2017   Procedure: COLONOSCOPY WITH PROPOFOL ;  Surgeon: Avram Lupita BRAVO, MD;  Location: [facility];  Service: Endoscopy;  Laterality: N/A;   GASTRIC ROUX-EN-Y N/A 02/27/2015   Procedure: LAPAROSCOPIC ROUX-EN-Y GASTRIC BYPASS WITH UPPER ENDOSCOPY;  Surgeon: Donnice Lunger, MD;  Location: [facility];  Service: General;  Laterality: N/A;   KNEE SURGERY Right     KNEE SURGERY    LAPAROSCOPIC APPENDECTOMY  06/10/2012   Procedure: APPENDECTOMY LAPAROSCOPIC;  Surgeon: Elspeth KYM Schultze, MD;  Location: [facility];  Service: General;  Laterality: N/A;   UPPER GASTROINTESTINAL ENDOSCOPY  04/14/2006    Normal    Allergies[2]  BP 138/75   Ht 5' 5 (1.651 m)   Wt 290 lb (131.5 kg)   BMI 48.26 kg/m       No data to display              No data to display              Objective:  Physical Exam:  Gen: NAD, comfortable in exam room  Knee exam not repeated today.  Assessment and Plan:  Bilateral knee arthritis - second gelsyn-3 injections given as below.  Follow up in 1 week for third injections.  After informed written consent timeout was performed, patient was lying supine on exam table. Right knee was prepped with alcohol swabs and utilizing superolateral approach with ultrasound guidance, patient's right knee was injected intraarticularly with 3mL lidocaine  followed by gelsyn-3. Patient tolerated the procedure well without immediate complications.  After informed written consent timeout was performed, patient was lying supine on exam table. Left knee was prepped with alcohol swabs and utilizing superolateral approach with ultrasound guidance, patient's left knee was injected intraarticularly with 3mL lidocaine  followed by gelsyn-3. Patient tolerated the procedure well without immediate complications.    [1]  Current Outpatient Medications on File Prior to Visit  Medication Sig Dispense Refill   amLODipine  (  NORVASC ) 2.5 MG tablet Take 1 tablet (2.5 mg total) by mouth 2 (two) times daily. 180 tablet 3   carvedilol  (COREG ) 12.5 MG tablet Take 1 tablet (12.5 mg total) by mouth 2 (two) times daily with a meal. 180 tablet 3   diazepam  (VALIUM ) 5 MG tablet Take 2 tablets 30 minutes prior to procedure. 2 tablet 0   DULoxetine  (CYMBALTA ) 30 MG capsule Take 1 capsule (30 mg total) by mouth daily. 30 capsule 2   fluticasone  (FLONASE ) 50 MCG/ACT nasal spray Place 1 spray into both nostrils 2 (two) times daily as needed for allergies or rhinitis. 48 g 5   furosemide  (LASIX ) 40 MG tablet TAKE 1 TABLET BY MOUTH DAILY 90 tablet 1   glucose blood test strip Check blood sugar twice  daily 100 each 11   Multiple Vitamins-Minerals (MULTIVITAMIN ADULT PO) Take 1 tablet by mouth daily. flintstone vitamins     RABEprazole  (ACIPHEX ) 20 MG tablet Take 1 tablet (20 mg total) by mouth daily. 90 tablet 3   tirzepatide  (MOUNJARO ) 12.5 MG/0.5ML Pen Inject 12.5 mg into the skin once a week. 6 mL 3   traMADol  (ULTRAM ) 50 MG tablet Take 1 tablet (50 mg total) by mouth every 6 (six) hours as needed. 30 tablet 0   Vitamin D , Ergocalciferol , (DRISDOL ) 1.25 MG (50000 UNIT) CAPS capsule Take 1 capsule (50,000 Units total) by mouth every 7 (seven) days. For 3 months. 12 capsule 0   No current facility-administered medications on file prior to visit.  [2]  Allergies Allergen Reactions   Ace Inhibitors Anaphylaxis    Bowel angioedema   Cheese Nausea And Vomiting   Red Dye #40 (Allura Red) Nausea And Vomiting   Tuna [Fish Allergy ] Nausea And Vomiting

## 2024-05-10 ENCOUNTER — Ambulatory Visit: Admitting: Diagnostic Neuroimaging

## 2024-05-11 ENCOUNTER — Other Ambulatory Visit: Payer: Self-pay

## 2024-05-11 ENCOUNTER — Ambulatory Visit (INDEPENDENT_AMBULATORY_CARE_PROVIDER_SITE_OTHER): Admitting: Family Medicine

## 2024-05-11 VITALS — BP 128/68 | Ht 65.0 in | Wt 290.0 lb

## 2024-05-11 DIAGNOSIS — M170 Bilateral primary osteoarthritis of knee: Secondary | ICD-10-CM

## 2024-05-11 MED ORDER — SODIUM HYALURONATE (VISCOSUP) 16.8 MG/2ML IX SOSY
16.8000 mg | PREFILLED_SYRINGE | Freq: Once | INTRA_ARTICULAR | Status: AC
  Administered 2024-05-11: 16.8 mg via INTRA_ARTICULAR

## 2024-05-12 NOTE — Progress Notes (Signed)
 PCP: [name], DO  Patient is a 64 y.o. female here for bilateral knee viscosupplementation.  HPI Patient returns for her third visco injection into both knees. No changes and no new injuries  Past Medical History:  Diagnosis Date   Acute on chronic appendicitis s/p lap appy 06/10/2012 06/10/2012   surgery done 06-10-12   Angioedema of intestine due to angiotensin converting enzyme inhibitor (ACE-I)    Arthritis    ankle   Back pain    Chest pain    Complication of anesthesia    Constipation    Diabetes mellitus    TYPE 2- no meds in several months-was taken off meds -monitoring blood sugar.   GERD (gastroesophageal reflux disease)    History of colon polyps    Hyperlipidemia    Hypertension    Joint pain    Lactose intolerance    Morbid obesity - BMI > 50 12/13/2012   Pneumonia    none recent   PONV (postoperative nausea and vomiting)    Poor venous access    usually difficult stick   Prediabetes    Sinusitis    SOB (shortness of breath)    Swelling    Vitamin D  deficiency     Medications Ordered Prior to Encounter[1]  Past Surgical History:  Procedure Laterality Date   ABDOMINAL HYSTERECTOMY     APPENDECTOMY     2'14   COLONOSCOPY  04/11/11   5 mm polyp removed but not recovered   COLONOSCOPY WITH PROPOFOL  N/A 05/11/2017   Procedure: COLONOSCOPY WITH PROPOFOL ;  Surgeon: Avram Lupita BRAVO, MD;  Location: [facility];  Service: Endoscopy;  Laterality: N/A;   GASTRIC ROUX-EN-Y N/A 02/27/2015   Procedure: LAPAROSCOPIC ROUX-EN-Y GASTRIC BYPASS WITH UPPER ENDOSCOPY;  Surgeon: Donnice Lunger, MD;  Location: [facility];  Service: General;  Laterality: N/A;   KNEE SURGERY Right     KNEE SURGERY    LAPAROSCOPIC APPENDECTOMY  06/10/2012   Procedure: APPENDECTOMY LAPAROSCOPIC;  Surgeon: Elspeth KYM Schultze, MD;  Location: [facility];  Service: General;  Laterality: N/A;   UPPER GASTROINTESTINAL ENDOSCOPY  04/14/2006   Normal    Allergies[2]  BP 128/68   Ht 5' 5 (1.651  m)   Wt 290 lb (131.5 kg)   BMI 48.26 kg/m       No data to display              No data to display              Objective:  Physical Exam:  Gen: NAD, comfortable in exam room  Assessment and Plan:  Bilateral knee arthritis - third visco injections given today.  Follow up as needed.  After informed written consent timeout was performed, patient was lying supine on exam table. Right knee was prepped with alcohol swabs and utilizing superolateral approach with ultrasound guidance, patient's right knee was injected intraarticularly with 3mL lidocaine  followed by gelsyn-3. Patient tolerated the procedure well without immediate complications.  After informed written consent timeout was performed, patient was lying supine on exam table. Left knee was prepped with alcohol swabs and utilizing superolateral approach with ultrasound guidance, patient's left knee was injected intraarticularly with 3mL lidocaine  followed by gelsyn-3. Patient tolerated the procedure well without immediate complications.    [1]  Current Outpatient Medications on File Prior to Visit  Medication Sig Dispense Refill   amLODipine  (NORVASC ) 2.5 MG tablet Take 1 tablet (2.5 mg total) by mouth 2 (two) times daily. 180 tablet 3  carvedilol  (COREG ) 12.5 MG tablet Take 1 tablet (12.5 mg total) by mouth 2 (two) times daily with a meal. 180 tablet 3   diazepam  (VALIUM ) 5 MG tablet Take 2 tablets 30 minutes prior to procedure. 2 tablet 0   DULoxetine  (CYMBALTA ) 30 MG capsule Take 1 capsule (30 mg total) by mouth daily. 30 capsule 2   fluticasone  (FLONASE ) 50 MCG/ACT nasal spray Place 1 spray into both nostrils 2 (two) times daily as needed for allergies or rhinitis. 48 g 5   furosemide  (LASIX ) 40 MG tablet TAKE 1 TABLET BY MOUTH DAILY 90 tablet 1   glucose blood test strip Check blood sugar twice daily 100 each 11   Multiple Vitamins-Minerals (MULTIVITAMIN ADULT PO) Take 1 tablet by mouth daily. flintstone vitamins      RABEprazole  (ACIPHEX ) 20 MG tablet Take 1 tablet (20 mg total) by mouth daily. 90 tablet 3   tirzepatide  (MOUNJARO ) 12.5 MG/0.5ML Pen Inject 12.5 mg into the skin once a week. 6 mL 3   traMADol  (ULTRAM ) 50 MG tablet Take 1 tablet (50 mg total) by mouth every 6 (six) hours as needed. 30 tablet 0   Vitamin D , Ergocalciferol , (DRISDOL ) 1.25 MG (50000 UNIT) CAPS capsule Take 1 capsule (50,000 Units total) by mouth every 7 (seven) days. For 3 months. 12 capsule 0   No current facility-administered medications on file prior to visit.  [2]  Allergies Allergen Reactions   Ace Inhibitors Anaphylaxis    Bowel angioedema   Cheese Nausea And Vomiting   Red Dye #40 (Allura Red) Nausea And Vomiting   Tuna [Fish Allergy ] Nausea And Vomiting

## 2024-05-26 ENCOUNTER — Ambulatory Visit: Admitting: Family Medicine

## 2024-05-31 ENCOUNTER — Ambulatory Visit: Admitting: Family Medicine

## 2024-06-13 ENCOUNTER — Ambulatory Visit: Admitting: Diagnostic Neuroimaging

## 2024-06-28 ENCOUNTER — Ambulatory Visit: Admitting: Family Medicine

## 2024-06-30 ENCOUNTER — Ambulatory Visit: Admitting: Neurology
